# Patient Record
Sex: Female | Born: 1950 | Race: Black or African American | Hispanic: No | Smoking: Never smoker
Health system: Southern US, Community
[De-identification: ages and names within clinical notes are randomized; demographics above are authoritative.]

## PROBLEM LIST (undated history)

## (undated) DIAGNOSIS — J439 Emphysema, unspecified: Secondary | ICD-10-CM

## (undated) DIAGNOSIS — R609 Edema, unspecified: Secondary | ICD-10-CM

## (undated) DIAGNOSIS — K649 Unspecified hemorrhoids: Secondary | ICD-10-CM

## (undated) DIAGNOSIS — K921 Melena: Secondary | ICD-10-CM

## (undated) DIAGNOSIS — I509 Heart failure, unspecified: Secondary | ICD-10-CM

## (undated) DIAGNOSIS — I10 Essential (primary) hypertension: Secondary | ICD-10-CM

## (undated) DIAGNOSIS — F32A Depression, unspecified: Secondary | ICD-10-CM

## (undated) DIAGNOSIS — E05 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: Secondary | ICD-10-CM

## (undated) DIAGNOSIS — R51 Headache: Principal | ICD-10-CM

## (undated) DIAGNOSIS — I517 Cardiomegaly: Secondary | ICD-10-CM

## (undated) DIAGNOSIS — D649 Anemia, unspecified: Secondary | ICD-10-CM

## (undated) DIAGNOSIS — G4730 Sleep apnea, unspecified: Secondary | ICD-10-CM

## (undated) DIAGNOSIS — E119 Type 2 diabetes mellitus without complications: Secondary | ICD-10-CM

## (undated) DIAGNOSIS — G4733 Obstructive sleep apnea (adult) (pediatric): Secondary | ICD-10-CM

## (undated) DIAGNOSIS — K900 Celiac disease: Secondary | ICD-10-CM

## (undated) DIAGNOSIS — E079 Disorder of thyroid, unspecified: Secondary | ICD-10-CM

## (undated) DIAGNOSIS — E785 Hyperlipidemia, unspecified: Secondary | ICD-10-CM

## (undated) DIAGNOSIS — R569 Unspecified convulsions: Secondary | ICD-10-CM

## (undated) DIAGNOSIS — I639 Cerebral infarction, unspecified: Secondary | ICD-10-CM

## (undated) DIAGNOSIS — R011 Cardiac murmur, unspecified: Secondary | ICD-10-CM

## (undated) DIAGNOSIS — F329 Major depressive disorder, single episode, unspecified: Secondary | ICD-10-CM

## (undated) DIAGNOSIS — E669 Obesity, unspecified: Secondary | ICD-10-CM

## (undated) DIAGNOSIS — IMO0002 Reserved for concepts with insufficient information to code with codable children: Secondary | ICD-10-CM

## (undated) HISTORY — DX: Unspecified convulsions: R56.9

## (undated) HISTORY — DX: Reserved for concepts with insufficient information to code with codable children: IMO0002

## (undated) HISTORY — DX: Cardiac murmur, unspecified: R01.1

## (undated) HISTORY — DX: Type 2 diabetes mellitus without complications: E11.9

## (undated) HISTORY — DX: Headache: R51

## (undated) HISTORY — PX: IUD REMOVAL: SHX5392

## (undated) HISTORY — DX: Anemia, unspecified: D64.9

## (undated) HISTORY — DX: Cardiomegaly: I51.7

## (undated) HISTORY — DX: Cerebral infarction, unspecified: I63.9

## (undated) HISTORY — DX: Depression, unspecified: F32.A

## (undated) HISTORY — DX: Emphysema, unspecified: J43.9

## (undated) HISTORY — PX: HEMORRHOID SURGERY: SHX153

## (undated) HISTORY — DX: Unspecified hemorrhoids: K64.9

## (undated) HISTORY — DX: 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: E05.00

## (undated) HISTORY — DX: Obstructive sleep apnea (adult) (pediatric): G47.33

## (undated) HISTORY — DX: Essential (primary) hypertension: I10

## (undated) HISTORY — DX: Disorder of thyroid, unspecified: E07.9

## (undated) HISTORY — DX: Celiac disease: K90.0

## (undated) HISTORY — DX: Hyperlipidemia, unspecified: E78.5

## (undated) HISTORY — DX: Sleep apnea, unspecified: G47.30

## (undated) HISTORY — DX: Melena: K92.1

## (undated) HISTORY — PX: TONSILLECTOMY: SUR1361

## (undated) HISTORY — PX: VEIN SURGERY: SHX48

## (undated) HISTORY — DX: Heart failure, unspecified: I50.9

## (undated) HISTORY — DX: Obesity, unspecified: E66.9

## (undated) HISTORY — DX: Edema, unspecified: R60.9

## (undated) HISTORY — DX: Major depressive disorder, single episode, unspecified: F32.9

---

## 2000-10-06 ENCOUNTER — Encounter: Payer: Self-pay | Admitting: Internal Medicine

## 2000-10-06 ENCOUNTER — Inpatient Hospital Stay (HOSPITAL_COMMUNITY): Admission: EM | Admit: 2000-10-06 | Discharge: 2000-10-08 | Payer: Self-pay | Admitting: Emergency Medicine

## 2000-12-29 ENCOUNTER — Emergency Department (HOSPITAL_COMMUNITY): Admission: EM | Admit: 2000-12-29 | Discharge: 2000-12-29 | Payer: Self-pay | Admitting: Emergency Medicine

## 2001-02-28 ENCOUNTER — Encounter: Payer: Self-pay | Admitting: Emergency Medicine

## 2001-02-28 ENCOUNTER — Emergency Department (HOSPITAL_COMMUNITY): Admission: EM | Admit: 2001-02-28 | Discharge: 2001-02-28 | Payer: Self-pay | Admitting: Emergency Medicine

## 2001-08-31 ENCOUNTER — Emergency Department (HOSPITAL_COMMUNITY): Admission: EM | Admit: 2001-08-31 | Discharge: 2001-08-31 | Payer: Self-pay | Admitting: Emergency Medicine

## 2002-03-30 ENCOUNTER — Emergency Department (HOSPITAL_COMMUNITY): Admission: EM | Admit: 2002-03-30 | Discharge: 2002-03-30 | Payer: Self-pay | Admitting: Emergency Medicine

## 2002-05-06 ENCOUNTER — Encounter: Payer: Self-pay | Admitting: Internal Medicine

## 2002-05-06 ENCOUNTER — Encounter: Admission: RE | Admit: 2002-05-06 | Discharge: 2002-05-06 | Payer: Self-pay | Admitting: Internal Medicine

## 2002-05-25 ENCOUNTER — Ambulatory Visit (HOSPITAL_COMMUNITY): Admission: RE | Admit: 2002-05-25 | Discharge: 2002-05-25 | Payer: Self-pay | Admitting: Gastroenterology

## 2002-05-25 ENCOUNTER — Encounter: Payer: Self-pay | Admitting: Gastroenterology

## 2002-05-27 ENCOUNTER — Other Ambulatory Visit: Admission: RE | Admit: 2002-05-27 | Discharge: 2002-05-27 | Payer: Self-pay | Admitting: Obstetrics and Gynecology

## 2002-05-29 ENCOUNTER — Encounter (INDEPENDENT_AMBULATORY_CARE_PROVIDER_SITE_OTHER): Payer: Self-pay

## 2002-05-29 ENCOUNTER — Ambulatory Visit (HOSPITAL_COMMUNITY): Admission: RE | Admit: 2002-05-29 | Discharge: 2002-05-29 | Payer: Self-pay | Admitting: Gastroenterology

## 2002-08-10 ENCOUNTER — Emergency Department (HOSPITAL_COMMUNITY): Admission: EM | Admit: 2002-08-10 | Discharge: 2002-08-10 | Payer: Self-pay | Admitting: Emergency Medicine

## 2002-10-08 ENCOUNTER — Encounter: Admission: RE | Admit: 2002-10-08 | Discharge: 2002-10-08 | Payer: Self-pay | Admitting: Gastroenterology

## 2002-10-08 ENCOUNTER — Encounter: Payer: Self-pay | Admitting: Gastroenterology

## 2002-11-03 ENCOUNTER — Ambulatory Visit: Admission: RE | Admit: 2002-11-03 | Discharge: 2002-11-03 | Payer: Self-pay | Admitting: Cardiology

## 2002-11-03 ENCOUNTER — Encounter (INDEPENDENT_AMBULATORY_CARE_PROVIDER_SITE_OTHER): Payer: Self-pay | Admitting: Cardiology

## 2004-04-17 ENCOUNTER — Ambulatory Visit (HOSPITAL_COMMUNITY): Admission: RE | Admit: 2004-04-17 | Discharge: 2004-04-17 | Payer: Self-pay | Admitting: Gastroenterology

## 2005-01-03 ENCOUNTER — Emergency Department (HOSPITAL_COMMUNITY): Admission: EM | Admit: 2005-01-03 | Discharge: 2005-01-03 | Payer: Self-pay | Admitting: Emergency Medicine

## 2005-02-09 ENCOUNTER — Encounter: Admission: RE | Admit: 2005-02-09 | Discharge: 2005-02-09 | Payer: Self-pay | Admitting: Internal Medicine

## 2006-03-26 ENCOUNTER — Emergency Department (HOSPITAL_COMMUNITY): Admission: EM | Admit: 2006-03-26 | Discharge: 2006-03-26 | Payer: Self-pay | Admitting: Family Medicine

## 2006-05-25 ENCOUNTER — Emergency Department (HOSPITAL_COMMUNITY): Admission: EM | Admit: 2006-05-25 | Discharge: 2006-05-25 | Payer: Self-pay | Admitting: Family Medicine

## 2006-09-06 ENCOUNTER — Emergency Department (HOSPITAL_COMMUNITY): Admission: EM | Admit: 2006-09-06 | Discharge: 2006-09-06 | Payer: Self-pay | Admitting: Family Medicine

## 2006-09-07 ENCOUNTER — Inpatient Hospital Stay (HOSPITAL_COMMUNITY): Admission: AD | Admit: 2006-09-07 | Discharge: 2006-09-11 | Payer: Self-pay | Admitting: Internal Medicine

## 2006-09-07 ENCOUNTER — Encounter: Payer: Self-pay | Admitting: Emergency Medicine

## 2006-09-09 ENCOUNTER — Encounter (INDEPENDENT_AMBULATORY_CARE_PROVIDER_SITE_OTHER): Payer: Self-pay | Admitting: Cardiology

## 2006-09-09 ENCOUNTER — Encounter: Payer: Self-pay | Admitting: Vascular Surgery

## 2006-11-28 ENCOUNTER — Ambulatory Visit (HOSPITAL_COMMUNITY): Admission: RE | Admit: 2006-11-28 | Discharge: 2006-11-28 | Payer: Self-pay | Admitting: Neurosurgery

## 2006-12-11 ENCOUNTER — Encounter: Admission: RE | Admit: 2006-12-11 | Discharge: 2006-12-11 | Payer: Self-pay | Admitting: Internal Medicine

## 2006-12-15 ENCOUNTER — Emergency Department (HOSPITAL_COMMUNITY): Admission: EM | Admit: 2006-12-15 | Discharge: 2006-12-15 | Payer: Self-pay | Admitting: Family Medicine

## 2007-03-24 ENCOUNTER — Emergency Department (HOSPITAL_COMMUNITY): Admission: EM | Admit: 2007-03-24 | Discharge: 2007-03-24 | Payer: Self-pay | Admitting: Family Medicine

## 2008-01-12 ENCOUNTER — Encounter: Admission: RE | Admit: 2008-01-12 | Discharge: 2008-01-12 | Payer: Self-pay | Admitting: Internal Medicine

## 2008-03-13 ENCOUNTER — Emergency Department (HOSPITAL_COMMUNITY): Admission: EM | Admit: 2008-03-13 | Discharge: 2008-03-13 | Payer: Self-pay | Admitting: Emergency Medicine

## 2009-03-01 ENCOUNTER — Encounter: Admission: RE | Admit: 2009-03-01 | Discharge: 2009-03-01 | Payer: Self-pay | Admitting: Internal Medicine

## 2009-03-27 ENCOUNTER — Emergency Department (HOSPITAL_COMMUNITY): Admission: EM | Admit: 2009-03-27 | Discharge: 2009-03-27 | Payer: Self-pay | Admitting: Family Medicine

## 2009-12-14 ENCOUNTER — Emergency Department (HOSPITAL_COMMUNITY): Admission: EM | Admit: 2009-12-14 | Discharge: 2009-12-14 | Payer: Self-pay | Admitting: Family Medicine

## 2010-06-30 ENCOUNTER — Inpatient Hospital Stay (HOSPITAL_COMMUNITY): Admission: EM | Admit: 2010-06-30 | Discharge: 2010-07-02 | Payer: Self-pay | Admitting: Emergency Medicine

## 2010-06-30 DIAGNOSIS — R569 Unspecified convulsions: Secondary | ICD-10-CM | POA: Insufficient documentation

## 2010-07-27 ENCOUNTER — Encounter: Admission: RE | Admit: 2010-07-27 | Discharge: 2010-07-27 | Payer: Self-pay | Admitting: Family Medicine

## 2010-07-31 ENCOUNTER — Ambulatory Visit: Payer: Self-pay | Admitting: Cardiovascular Disease

## 2010-09-09 ENCOUNTER — Emergency Department (HOSPITAL_COMMUNITY): Admission: EM | Admit: 2010-09-09 | Discharge: 2010-09-09 | Payer: Self-pay | Admitting: Emergency Medicine

## 2010-10-15 ENCOUNTER — Emergency Department (HOSPITAL_COMMUNITY): Admission: EM | Admit: 2010-10-15 | Discharge: 2010-10-15 | Payer: Self-pay | Admitting: Emergency Medicine

## 2010-10-15 ENCOUNTER — Encounter (INDEPENDENT_AMBULATORY_CARE_PROVIDER_SITE_OTHER): Payer: Self-pay | Admitting: Emergency Medicine

## 2010-10-15 ENCOUNTER — Ambulatory Visit: Payer: Self-pay | Admitting: Vascular Surgery

## 2010-10-27 ENCOUNTER — Emergency Department (HOSPITAL_COMMUNITY): Admission: EM | Admit: 2010-10-27 | Discharge: 2010-10-27 | Payer: Self-pay | Admitting: Emergency Medicine

## 2010-10-27 ENCOUNTER — Emergency Department (HOSPITAL_COMMUNITY): Admission: EM | Admit: 2010-10-27 | Discharge: 2010-10-28 | Payer: Self-pay | Admitting: Emergency Medicine

## 2010-10-31 ENCOUNTER — Emergency Department (HOSPITAL_COMMUNITY): Admission: EM | Admit: 2010-10-31 | Discharge: 2010-10-31 | Payer: Self-pay | Admitting: Emergency Medicine

## 2010-12-08 ENCOUNTER — Ambulatory Visit: Payer: Self-pay | Admitting: Vascular Surgery

## 2011-01-03 ENCOUNTER — Ambulatory Visit: Admit: 2011-01-03 | Payer: Self-pay | Admitting: Obstetrics & Gynecology

## 2011-01-05 ENCOUNTER — Ambulatory Visit: Admit: 2011-01-05 | Payer: Self-pay | Admitting: Vascular Surgery

## 2011-01-05 ENCOUNTER — Ambulatory Visit
Admission: RE | Admit: 2011-01-05 | Discharge: 2011-01-05 | Payer: Self-pay | Source: Home / Self Care | Attending: Vascular Surgery | Admitting: Vascular Surgery

## 2011-01-05 NOTE — Assessment & Plan Note (Addendum)
OFFICE VISIT  Kennerly, Trenisha DOB:  Apr 23, 1951                                       01/05/2011 [id number]#:[id number]  This is an established patient.  HISTORY OF PRESENT ILLNESS:  This is a 60 year old patient who presents for followup after bilateral lower extremity venous insufficiency duplexes.  She had a venous ulceration in primarily her right leg on her evaluation in December that I recommended she proceed forward with compression stockings.  She notes that she has been religiously using her compression stockings and she feels that the discoloration in both legs is improved and the ulcers are starting to close up.  No fevers or chills at this point.  PHYSICAL EXAMINATION:  Vital signs:  Today she had a blood pressure 125/82, a heart rate of 65, respirations were 12, saturating 97%.  On the right shin the medial area she has a couple of healing ulcers that are less than a centimeter at this point.  Does have skin changes consistent with a lipodermatosclerosis in both legs.  No obvious ulcerations in the left leg.  On noninvasive vascular imaging she had bilateral lower extremity insufficiency duplex completed.  It demonstrates the right side an enlarged greater saphenous vein.  There is evidence of reflux within both saphenofemoral junctions, reflux also in the right proximal greater saphenous vein and reflux within bilateral common femoral veins, looks like there is no obvious DVT elsewhere.  MEDICAL DECISION MAKING:  This is a 60 year old female with slowly healing right venous stasis ulcers with now confirmed venous insufficiency.  I will refer her now to the vein side of the clinic for evaluation by Dr. Hart Rochester or Dr. Arbie Cookey for possible endovenous ablation. She has been on greater than 6 months worth of compression stockings by report.  Will need to obtain the records from her outside physician who started the compression regimen.  I told her to  continue use of the compression stockings, she is going to continue local wound care to her right leg and then hopefully she will be a candidate for possible ablation of her saphenous vein on the right side first.    Fransisco Hertz, MD Electronically Signed  BLC/MEDQ  D:  01/05/2011  T:  01/05/2011  Job:  [id number]

## 2011-01-07 ENCOUNTER — Encounter: Payer: Self-pay | Admitting: Internal Medicine

## 2011-01-09 NOTE — Procedures (Unsigned)
LOWER EXTREMITY VENOUS REFLUX EXAM  INDICATION:  Ulcers and swelling.  EXAM:  Using color-flow imaging and pulse Doppler spectral analysis, the right and left common femoral, superficial femoral, popliteal, posterior tibial, greater and lesser saphenous veins are evaluated.  There is evidence suggesting deep venous insufficiency in the right and left common femoral vein.  The right and left saphenofemoral junction is not competent with Reflux of >563milliseconds. The right GSV is not competent with Reflux of >57milliseconds in the saphenofemoral junction in the proximal thigh. The left GSV is competent.  The right and left proximal short saphenous vein demonstrates competency.  GSV Diameter (used if found to be incompetent only)                                           Right    Left Proximal Greater Saphenous Vein           0.70 cm  0.85 cm Proximal-to-mid-thigh                     0.71 cm  cm Mid thigh                                 cm       cm Mid-distal thigh                          cm       cm Distal thigh                              cm       cm Knee                                      cm       cm  IMPRESSION: 1. Right and left greater saphenous veins are not competent with     reflux >535milliseconds in the saphenofemoral junction on the right     as well as the right proximal thigh.  The left saphenofemoral     junction is not competent. 2. The right and left greater saphenous veins are not tortuous. 3. The deep venous system is not competent with reflux of >500     milliseconds within bilateral common femoral veins. 4. The right and left short saphenous vein is competent.         ___________________________________________ Fransisco Hertz, MD  OD/MEDQ  D:  01/05/2011  T:  01/05/2011  Job:  [id number]

## 2011-01-31 ENCOUNTER — Ambulatory Visit (INDEPENDENT_AMBULATORY_CARE_PROVIDER_SITE_OTHER): Payer: Medicaid Other | Admitting: Cardiovascular Disease

## 2011-01-31 DIAGNOSIS — I119 Hypertensive heart disease without heart failure: Secondary | ICD-10-CM

## 2011-02-14 ENCOUNTER — Encounter (INDEPENDENT_AMBULATORY_CARE_PROVIDER_SITE_OTHER): Payer: Medicaid Other | Admitting: Vascular Surgery

## 2011-02-14 DIAGNOSIS — I83893 Varicose veins of bilateral lower extremities with other complications: Secondary | ICD-10-CM

## 2011-02-15 NOTE — Assessment & Plan Note (Signed)
OFFICE VISIT  [name], [name] DOB:  Jan 05, 1951                                       02/14/2011 [id number]#:[id number]  The patient presents today for continued followup of her venous stasis ulceration and venous hypertension.  She had seen Dr. Leonides Sake in his office in consultation on December 31, 2010.  I am seeing her for further discussion.  She does have reflux in her great saphenous veins bilaterally.  She does have near-total healing of the venous ulcer on her pretibial area on the right and has had progressive skin changes on the left but no complete ulceration.  Physical exam is otherwise unchanged.  She does have palpable pedal pulses.  I discussed the options with the patient.  She is continuing to have pain despite the compression.  I have recommended that we proceed with laser ablation of her saphenous veins for reduction of venous hypertension.  Hopefully, this will halt her changes in venous hypertension and ulceration.  She understands this is an office procedure under local anesthesia.  We will plan on treating her right leg first since this is the most severe, followed by staged left leg ablation at her convenience.    Larina Earthly, M.D. Electronically Signed  TFE/MEDQ  D:  02/14/2011  T:  02/15/2011  Job:  [id number]  cc:   Ace Gins, MD

## 2011-02-23 ENCOUNTER — Encounter (HOSPITAL_COMMUNITY)
Admission: RE | Admit: 2011-02-23 | Discharge: 2011-02-23 | Disposition: A | Discharge status: Home / Self Care | Payer: Medicaid Other | Source: Ambulatory Visit | Attending: Obstetrics and Gynecology | Admitting: Obstetrics and Gynecology

## 2011-02-23 DIAGNOSIS — Z01812 Encounter for preprocedural laboratory examination: Secondary | ICD-10-CM | POA: Insufficient documentation

## 2011-02-23 DIAGNOSIS — Z01818 Encounter for other preprocedural examination: Secondary | ICD-10-CM | POA: Insufficient documentation

## 2011-02-23 LAB — CBC
HCT: 36.6 % (ref 36.0–46.0)
Hemoglobin: 11.6 g/dL — ABNORMAL LOW (ref 12.0–15.0)
MCH: 29.1 pg (ref 26.0–34.0)
MCHC: 31.7 g/dL (ref 30.0–36.0)
MCV: 91.7 fL (ref 78.0–100.0)
Platelets: 169 10*3/uL (ref 150–400)
RBC: 3.99 MIL/uL (ref 3.87–5.11)
RDW: 15.3 % (ref 11.5–15.5)
WBC: 3.9 10*3/uL — ABNORMAL LOW (ref 4.0–10.5)

## 2011-02-23 LAB — COMPREHENSIVE METABOLIC PANEL
ALT: 24 U/L (ref 0–35)
AST: 28 U/L (ref 0–37)
Albumin: 3.7 g/dL (ref 3.5–5.2)
Alkaline Phosphatase: 60 U/L (ref 39–117)
BUN: 13 mg/dL (ref 6–23)
CO2: 29 mEq/L (ref 19–32)
Calcium: 9.2 mg/dL (ref 8.4–10.5)
Chloride: 106 mEq/L (ref 96–112)
Creatinine, Ser: 1.16 mg/dL (ref 0.4–1.2)
GFR calc Af Amer: 58 mL/min — ABNORMAL LOW (ref >60)
GFR calc non Af Amer: 48 mL/min — ABNORMAL LOW (ref >60)
Glucose, Bld: 122 mg/dL — ABNORMAL HIGH (ref 70–99)
Potassium: 3.7 mEq/L (ref 3.5–5.1)
Sodium: 139 mEq/L (ref 135–145)
Total Bilirubin: 0.8 mg/dL (ref 0.3–1.2)
Total Protein: 7.1 g/dL (ref 6.0–8.3)

## 2011-02-27 ENCOUNTER — Ambulatory Visit (HOSPITAL_COMMUNITY)
Admission: RE | Admit: 2011-02-27 | Discharge: 2011-02-27 | Disposition: A | Discharge status: Home / Self Care | Payer: Medicaid Other | Source: Ambulatory Visit | Attending: Obstetrics and Gynecology | Admitting: Obstetrics and Gynecology

## 2011-02-27 ENCOUNTER — Other Ambulatory Visit: Payer: Self-pay | Admitting: Obstetrics and Gynecology

## 2011-02-27 DIAGNOSIS — N950 Postmenopausal bleeding: Secondary | ICD-10-CM | POA: Insufficient documentation

## 2011-02-27 DIAGNOSIS — Z01812 Encounter for preprocedural laboratory examination: Secondary | ICD-10-CM | POA: Insufficient documentation

## 2011-02-27 DIAGNOSIS — Z30432 Encounter for removal of intrauterine contraceptive device: Secondary | ICD-10-CM | POA: Insufficient documentation

## 2011-02-27 LAB — TYPE AND SCREEN
ABO/RH(D): B POS
Antibody Screen: NEGATIVE

## 2011-02-27 LAB — BASIC METABOLIC PANEL
BUN: 16 mg/dL (ref 6–23)
CO2: 24 mEq/L (ref 19–32)
Calcium: 8.8 mg/dL (ref 8.4–10.5)
Chloride: 104 mEq/L (ref 96–112)
Creatinine, Ser: 0.98 mg/dL (ref 0.4–1.2)
GFR calc Af Amer: >60 mL/min (ref >60)
GFR calc non Af Amer: 58 mL/min — ABNORMAL LOW (ref >60)
Glucose, Bld: 92 mg/dL (ref 70–99)
Potassium: 3.9 mEq/L (ref 3.5–5.1)
Sodium: 135 mEq/L (ref 135–145)

## 2011-02-27 LAB — DIFFERENTIAL
Basophils Absolute: 0 10*3/uL (ref 0.0–0.1)
Basophils Absolute: 0 10*3/uL (ref 0.0–0.1)
Basophils Relative: 0 % (ref 0–1)
Basophils Relative: 1 % (ref 0–1)
Eosinophils Absolute: 0.1 10*3/uL (ref 0.0–0.7)
Eosinophils Absolute: 0.2 10*3/uL (ref 0.0–0.7)
Eosinophils Relative: 3 % (ref 0–5)
Eosinophils Relative: 3 % (ref 0–5)
Lymphocytes Relative: 34 % (ref 12–46)
Lymphocytes Relative: 38 % (ref 12–46)
Lymphs Abs: 2 10*3/uL (ref 0.7–4.0)
Lymphs Abs: 2 10*3/uL (ref 0.7–4.0)
Monocytes Absolute: 0.4 10*3/uL (ref 0.1–1.0)
Monocytes Absolute: 0.6 10*3/uL (ref 0.1–1.0)
Monocytes Relative: 11 % (ref 3–12)
Monocytes Relative: 8 % (ref 3–12)
Neutro Abs: 2.6 10*3/uL (ref 1.7–7.7)
Neutro Abs: 3 10*3/uL (ref 1.7–7.7)
Neutrophils Relative %: 51 % (ref 43–77)
Neutrophils Relative %: 52 % (ref 43–77)

## 2011-02-27 LAB — CBC
HCT: 31.1 % — ABNORMAL LOW (ref 36.0–46.0)
HCT: 34.1 % — ABNORMAL LOW (ref 36.0–46.0)
Hemoglobin: 10.1 g/dL — ABNORMAL LOW (ref 12.0–15.0)
Hemoglobin: 10.8 g/dL — ABNORMAL LOW (ref 12.0–15.0)
MCH: 28.6 pg (ref 26.0–34.0)
MCH: 28.9 pg (ref 26.0–34.0)
MCHC: 31.7 g/dL (ref 30.0–36.0)
MCHC: 32.5 g/dL (ref 30.0–36.0)
MCV: 89.1 fL (ref 78.0–100.0)
MCV: 90.5 fL (ref 78.0–100.0)
Platelets: 304 10*3/uL (ref 150–400)
Platelets: 340 10*3/uL (ref 150–400)
RBC: 3.49 MIL/uL — ABNORMAL LOW (ref 3.87–5.11)
RBC: 3.77 MIL/uL — ABNORMAL LOW (ref 3.87–5.11)
RDW: 14.1 % (ref 11.5–15.5)
RDW: 14.2 % (ref 11.5–15.5)
WBC: 5.2 10*3/uL (ref 4.0–10.5)
WBC: 5.8 10*3/uL (ref 4.0–10.5)

## 2011-02-27 LAB — WOUND CULTURE

## 2011-02-27 LAB — ABO/RH: ABO/RH(D): B POS

## 2011-02-28 LAB — GLUCOSE, CAPILLARY: Glucose-Capillary: 121 mg/dL — ABNORMAL HIGH (ref 70–99)

## 2011-03-03 LAB — CBC
HCT: 37.8 % (ref 36.0–46.0)
HCT: 40 % (ref 36.0–46.0)
Hemoglobin: 12.6 g/dL (ref 12.0–15.0)
Hemoglobin: 13.2 g/dL (ref 12.0–15.0)
MCH: 30.4 pg (ref 26.0–34.0)
MCH: 30.7 pg (ref 26.0–34.0)
MCHC: 33 g/dL (ref 30.0–36.0)
MCHC: 33.3 g/dL (ref 30.0–36.0)
MCV: 92.1 fL (ref 78.0–100.0)
MCV: 92.1 fL (ref 78.0–100.0)
Platelets: 183 10*3/uL (ref 150–400)
Platelets: 251 10*3/uL (ref 150–400)
RBC: 4.1 MIL/uL (ref 3.87–5.11)
RBC: 4.35 MIL/uL (ref 3.87–5.11)
RDW: 15 % (ref 11.5–15.5)
RDW: 15 % (ref 11.5–15.5)
WBC: 6.5 10*3/uL (ref 4.0–10.5)
WBC: 6.5 10*3/uL (ref 4.0–10.5)

## 2011-03-03 LAB — BASIC METABOLIC PANEL
BUN: 10 mg/dL (ref 6–23)
CO2: 26 mEq/L (ref 19–32)
Calcium: 9.3 mg/dL (ref 8.4–10.5)
Chloride: 105 mEq/L (ref 96–112)
Creatinine, Ser: 1.22 mg/dL — ABNORMAL HIGH (ref 0.4–1.2)
GFR calc Af Amer: 55 mL/min — ABNORMAL LOW (ref >60)
GFR calc non Af Amer: 45 mL/min — ABNORMAL LOW (ref >60)
Glucose, Bld: 127 mg/dL — ABNORMAL HIGH (ref 70–99)
Potassium: 4 mEq/L (ref 3.5–5.1)
Sodium: 139 mEq/L (ref 135–145)

## 2011-03-03 LAB — COMPREHENSIVE METABOLIC PANEL
ALT: 22 U/L (ref 0–35)
AST: 37 U/L (ref 0–37)
Albumin: 4.3 g/dL (ref 3.5–5.2)
Alkaline Phosphatase: 81 U/L (ref 39–117)
BUN: 10 mg/dL (ref 6–23)
CO2: 26 mEq/L (ref 19–32)
Calcium: 9.4 mg/dL (ref 8.4–10.5)
Chloride: 104 mEq/L (ref 96–112)
Creatinine, Ser: 1.28 mg/dL — ABNORMAL HIGH (ref 0.4–1.2)
GFR calc Af Amer: 52 mL/min — ABNORMAL LOW (ref >60)
GFR calc non Af Amer: 43 mL/min — ABNORMAL LOW (ref >60)
Glucose, Bld: 158 mg/dL — ABNORMAL HIGH (ref 70–99)
Potassium: 4 mEq/L (ref 3.5–5.1)
Sodium: 138 mEq/L (ref 135–145)
Total Bilirubin: 0.5 mg/dL (ref 0.3–1.2)
Total Protein: 8.1 g/dL (ref 6.0–8.3)

## 2011-03-03 LAB — MRSA PCR SCREENING: MRSA by PCR: NEGATIVE

## 2011-03-03 LAB — DIFFERENTIAL
Basophils Absolute: 0 10*3/uL (ref 0.0–0.1)
Basophils Relative: 1 % (ref 0–1)
Eosinophils Absolute: 0.2 10*3/uL (ref 0.0–0.7)
Eosinophils Relative: 3 % (ref 0–5)
Lymphocytes Relative: 22 % (ref 12–46)
Lymphs Abs: 1.4 10*3/uL (ref 0.7–4.0)
Monocytes Absolute: 0.4 10*3/uL (ref 0.1–1.0)
Monocytes Relative: 6 % (ref 3–12)
Neutro Abs: 4.5 10*3/uL (ref 1.7–7.7)
Neutrophils Relative %: 69 % (ref 43–77)

## 2011-03-03 LAB — GLUCOSE, CAPILLARY
Glucose-Capillary: 173 mg/dL — ABNORMAL HIGH (ref 70–99)
Glucose-Capillary: 347 mg/dL — ABNORMAL HIGH (ref 70–99)

## 2011-03-03 LAB — TROPONIN I: Troponin I: 0.02 ng/mL (ref 0.00–0.06)

## 2011-03-03 LAB — CK TOTAL AND CKMB (NOT AT ARMC)
CK, MB: 4.7 ng/mL — ABNORMAL HIGH (ref 0.3–4.0)
Relative Index: 1 (ref 0.0–2.5)
Total CK: 465 U/L — ABNORMAL HIGH (ref 7–177)

## 2011-03-03 LAB — APTT: aPTT: 30 seconds (ref 24–37)

## 2011-03-03 LAB — PROTIME-INR
INR: 1 (ref 0.00–1.49)
Prothrombin Time: 13.1 seconds (ref 11.6–15.2)

## 2011-03-03 NOTE — H&P (Signed)
[name], [name]                 ACCOUNT NO.:  [id number]  MEDICAL RECORD NO.:  [id number]           PATIENT TYPE:  O  LOCATION:  [facility]                           FACILITY:  [facility]  PHYSICIAN:  Huel Cote, M.D. DATE OF BIRTH:  [birth year]  DATE OF ADMISSION:  02/23/2011 DATE OF DISCHARGE:  02/23/2011                             HISTORY & PHYSICAL   DATE OF SURGERY:  February 27, 2011  TIME OF SURGERY:  7:30 a.m.  The patient is a 60 year old G5, P3 who is coming in for a scheduled D and C hysteroscopy and retrieval of retained IUD.  The patient first noted some postmenopausal bleeding back in November 2011, which she reported to her primary care doctor.  He advised her to see a GYN doctor, which she has done.  At that point, she had some light vaginal bleeding for several days and reports that since that time, there has really been no significant active bleeding.  She is not currently sexually active and had an ultrasound, which confirmed what she reported as a retained IUD with the strings not visible in the vagina and an endometrium that seems normal at 3-4 mm.  She also had some issues with some rectal bleeding, which she will be working up with GI.  PAST MEDICAL HISTORY:  Significant for; 1. Borderline diabetes. 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 3. She had a hemorrhagic stroke in 2005, which resolved with no     residual side effects. 4. Hypercholesterolemia. 5. Hypertension. 6. Acquired hypothyroidism. 7. History of a peptic ulcer and did have a seizure in 2011, which was     from her scarring from her CVA.  PAST SURGICAL HISTORY:  Significant for cesarean section and wisdom tooth extraction.  PAST OBSTETRICAL HISTORY:  Significant for 4 vaginal deliveries and one cesarean section.  PAST GYN HISTORY:  The retained IUD as stated and the postmenopausal bleeding, she had no history of any abnormal Pap smears.  She socially is widowed.  She denies any tobacco, alcohol or drug  use.  Two of her children have predeceased her from gunshot wounds.  ALLERGIES:  . She has no known allergies to latex or medication.  MEDICATIONS:  Her medications are as follows; 1. Norvasc. 2. Carvedilol. 3. Carbon. 4. Levothyroxine. 5. Vitamin D. 6. Klor-Con  PHYSICAL EXAMINATION:  VITAL SIGNS:  Her weight is 281 pounds.  Height is 5 feet 4 itches. CARDIAC:  Regular rate and rhythm. LUNGS:  Clear. ABDOMEN:  Soft and nontender.  She has a midline scar from her prior C- section.  ASSESSMENT AND PLAN:  She has normal external genitalia noted except for atrophic change.  Cervix is normal with no IUD part or strings visible. Uterus is nontender to palpation and small and normal in size at approximately 5 to 6 cm.  The patient was counseled as to her options and wishes to have her retained IUD removed.  At the same time, we will do endometrial sampling of her cavity given her postmenopausal spotting, however, given the normal endometrial lining, this would appear to be likely related to the IUD itself and  no significant pathology.  The risks of uterine perforation and bleeding were discussed with the patient in detail.  She will do a prior treatment with Cytotec to see if she can help with reducing the risk of uterine perforation with cervical dilatation and we will proceed with Surgery as stated at [facility] facility on February 27, 2011.     Huel Cote, M.D.     KR/MEDQ  D:  02/26/2011  T:  02/26/2011  Job:  [id number]  Electronically Signed by Huel Cote M.D. on 03/03/2011 10:43:13 AM

## 2011-03-03 NOTE — Op Note (Signed)
[name], [name]                 ACCOUNT NO.:  [id number]  MEDICAL RECORD NO.:  [id number]           PATIENT TYPE:  O  LOCATION:  [facility]                          FACILITY:  [facility]  PHYSICIAN:  Huel Cote, M.D. DATE OF BIRTH:  [birth year]  DATE OF PROCEDURE:  02/27/2011 DATE OF DISCHARGE:                              OPERATIVE REPORT   PREOPERATIVE DIAGNOSES: 1. Retained intrauterine device. 2. Postmenopausal bleeding.  POSTOPERATIVE DIAGNOSES: 1. Retained intrauterine device. 2. Postmenopausal bleeding.  PROCEDURE:  Hysteroscopic retrieval of retained IUD and a D and C.  SURGEON:  Huel Cote, MD  ANESTHESIA:  LMA with a local paracervical 1% lidocaine block.  FINDINGS:  There was an atrophic cavity.  The patient had an S-shaped IUD, which was wedged sideways and extending into the right cornua.  SPECIMENS:  Endometrial curettings were sent.  The IUD was removed and discarded.  Estimated blood loss 50 mL.  Urine output 150 mL of clear urine, 1800 mL LR.  Hysteroscopic deficit was less than 100 mL.  There were no known complications.  The patient was taken to the recovery room in good condition.  PROCEDURE NOTE:  The patient was taken to the operating room where anesthesia was obtained without difficulty.  She was then prepped and draped in the normal sterile fashion in the dorsal lithotomy position. Speculum was placed within the vagina.  The cervix identified and injected with approximately 20 mL of 1% lidocaine with at 2 and 10 o'clock and on the anterior lip.  The cervix was then noted be slightly stenotic and was dilated with the yellow flexible dilator.  Once this was performed, the cervix was able to be dilated with the [facility] dilators.  The diagnostic scope was then introduced into the uterine cavity without substantial difficulty and the IUD noted to be sideways with the trailing in to the patient's left and the right not visible as it extended up into the  uterine cornua.  The hysteroscopic grasper was then introduced through the port and with gentle traction, the IUD was manipulated where the trailing in and was brought down closer to the cervical os.  The portion extending into the right cornua could never be completely pulled free.  However, once the IUD was worked down closer to the cervix, a polyp forceps grasper was introduced through the cervix. After the hysteroscope was removed and the IUD was grasped and pulled out in its entirety, the hysteroscope was then reintroduced in the uterine cavity carefully explored and appeared that the cavity had no perforations and was intact.  A gentle curettage was then performed secondary to the patient's postmenopausal bleeding to make sure there was no pathology other than IUD and this was handed off to pathology. However, the cavity appeared quite atrophic and normal for her menopausal status.  The instruments and sponges were then removed from the vagina.  The tenaculum site on the left was treated with silver nitrate.  The tenaculum site on the right continued to have some oozing. Therefore, one figure-of-eight suture of 3-0 Vicryl was placed and good hemostasis obtained.  At the conclusion of  the procedure, all sponge, lap and instrument counts were correct x2 and the patient was awakened and taken to the recovery room in good condition.     Huel Cote, M.D.     KR/MEDQ  D:  02/27/2011  T:  02/27/2011  Job:  [id number]  Electronically Signed by Huel Cote M.D. on 03/03/2011 10:43:15 AM

## 2011-03-14 ENCOUNTER — Other Ambulatory Visit (INDEPENDENT_AMBULATORY_CARE_PROVIDER_SITE_OTHER): Payer: Medicaid Other | Admitting: Vascular Surgery

## 2011-03-14 DIAGNOSIS — I83893 Varicose veins of bilateral lower extremities with other complications: Secondary | ICD-10-CM

## 2011-03-15 NOTE — Assessment & Plan Note (Signed)
OFFICE VISIT  State, [name] DOB:  [birth year]                                       03/14/2011 [id number]#:[id number]  The patient had laser ablation of her right great saphenous vein from distal thigh to just below the saphenofemoral junction.  This was under tumescent anesthesia with no immediate complication.  She was discharged to home and will be seen again in 1 week with repeat duplex follow-up.    Larina Earthly, M.D. Electronically Signed  TFE/MEDQ  D:  03/14/2011  T:  03/15/2011  Job:  [id number]

## 2011-03-21 ENCOUNTER — Encounter (INDEPENDENT_AMBULATORY_CARE_PROVIDER_SITE_OTHER): Payer: Medicaid Other

## 2011-03-21 ENCOUNTER — Ambulatory Visit (INDEPENDENT_AMBULATORY_CARE_PROVIDER_SITE_OTHER): Payer: Medicaid Other | Admitting: Vascular Surgery

## 2011-03-21 DIAGNOSIS — I872 Venous insufficiency (chronic) (peripheral): Secondary | ICD-10-CM

## 2011-03-21 DIAGNOSIS — L97909 Non-pressure chronic ulcer of unspecified part of unspecified lower leg with unspecified severity: Secondary | ICD-10-CM

## 2011-03-21 DIAGNOSIS — Z48812 Encounter for surgical aftercare following surgery on the circulatory system: Secondary | ICD-10-CM

## 2011-03-21 DIAGNOSIS — I83893 Varicose veins of bilateral lower extremities with other complications: Secondary | ICD-10-CM

## 2011-03-21 DIAGNOSIS — I83009 Varicose veins of unspecified lower extremity with ulcer of unspecified site: Secondary | ICD-10-CM

## 2011-03-22 NOTE — Assessment & Plan Note (Signed)
OFFICE VISIT  Economos, Itzell DOB:  January 25, 1951                                       03/21/2011 [id number]#:[id number]  Patient presents today for follow-up of her right great saphenous vein laser ablation.  She has had no immediate complications.  She has minimal discomfort associated with this.  On physical exam she has a palpable dorsalis pedis pulse.  She does have some mild thickening over the medial aspect of her right thigh.  She underwent a formal duplex in our office today, and this reveals no evidence of DVT and does show ablation of her great saphenous vein from the distal insertion site up to just below the saphenofemoral junction.  I am pleased with her early result for treatment of her venous hypertension.  She wishes to proceed with staged left leg ablation, and we have scheduled this at her convenience in approximately 2-3 weeks.    Larina Earthly, M.D. Electronically Signed  TFE/MEDQ  D:  03/21/2011  T:  03/22/2011  Job:  [id number]

## 2011-03-27 NOTE — Procedures (Unsigned)
DUPLEX DEEP VENOUS EXAM - LOWER EXTREMITY  INDICATION:  Followup EVLA on right lower extremity on 03/14/2011.  HISTORY:  Edema:  Yes. Trauma/Surgery:  Right EVLA 03/14/2011. Pain:  Yes. PE:  No. Previous DVT:  No. Anticoagulants:  No. Other:  DUPLEX EXAM:               CFV   SFV   PopV  PTV    GSV               R  L  R  L  R  L  R   L  R  L Thrombosis    0  0  0     0     0      +  0 Spontaneous   +  +  +     +     +      0 Phasic        +  +  +     +     +      0 Augmentation  +  +  +     +     +      0 Compressible  +  +  +     +     +      0  + Competent     +  +  +     +  Legend:  + - yes  o - no  p - partial  D - decreased  IMPRESSION: 1. No evidence of deep venous thrombosis identified on the right lower     extremity. 2. Good post ablation result the length of the right greater saphenous     vein ablated without extension present at the common femoral     junction. 3. Patent contralateral common femoral vein and greater saphenous vein     at the saphenofemoral junction. 4. Incompetent perforator measuring 0.19 cm at the distal right calf     arising off the greater saphenous vein.     _____________________________ Larina Earthly, M.D.  SH/MEDQ  D:  03/21/2011  T:  03/21/2011  Job:  [id number]

## 2011-04-10 ENCOUNTER — Inpatient Hospital Stay (INDEPENDENT_AMBULATORY_CARE_PROVIDER_SITE_OTHER)
Admission: RE | Admit: 2011-04-10 | Discharge: 2011-04-10 | Disposition: A | Discharge status: Home / Self Care | Payer: Medicaid Other | Source: Ambulatory Visit | Attending: Family Medicine | Admitting: Family Medicine

## 2011-04-10 DIAGNOSIS — Z760 Encounter for issue of repeat prescription: Secondary | ICD-10-CM

## 2011-04-11 ENCOUNTER — Other Ambulatory Visit (INDEPENDENT_AMBULATORY_CARE_PROVIDER_SITE_OTHER): Payer: Medicaid Other | Admitting: Vascular Surgery

## 2011-04-11 DIAGNOSIS — I83893 Varicose veins of bilateral lower extremities with other complications: Secondary | ICD-10-CM

## 2011-04-11 LAB — TSH: TSH: 2.64 u[IU]/mL (ref 0.350–4.500)

## 2011-04-12 NOTE — Assessment & Plan Note (Signed)
OFFICE VISIT  Ventress, Chrislyn DOB:  1951-02-10                                       04/11/2011 [id number]#:[id number]  Patient presents today for treatment of her left leg venous hypertension.  She underwent uneventful laser ablation of her great saphenous vein from just below the knee to just below the saphenofemoral junction under local tumescent anesthesia.  She had no immediate complications, was discharged to home.  Will be seen again in 1 week for repeat duplex.    Larina Earthly, M.D. Electronically Signed  TFE/MEDQ  D:  04/11/2011  T:  04/12/2011  Job:  [id number]

## 2011-04-18 ENCOUNTER — Encounter (INDEPENDENT_AMBULATORY_CARE_PROVIDER_SITE_OTHER): Payer: Medicaid Other

## 2011-04-18 ENCOUNTER — Ambulatory Visit (INDEPENDENT_AMBULATORY_CARE_PROVIDER_SITE_OTHER): Payer: Medicaid Other | Admitting: Vascular Surgery

## 2011-04-18 DIAGNOSIS — I83893 Varicose veins of bilateral lower extremities with other complications: Secondary | ICD-10-CM

## 2011-04-18 DIAGNOSIS — I872 Venous insufficiency (chronic) (peripheral): Secondary | ICD-10-CM

## 2011-04-18 DIAGNOSIS — Z48812 Encounter for surgical aftercare following surgery on the circulatory system: Secondary | ICD-10-CM

## 2011-04-19 NOTE — Assessment & Plan Note (Signed)
OFFICE VISIT  Granda, Zeynep DOB:  1951-09-21                                       04/18/2011 UEAVW#:[id number]  Patient presents today for a 1-week follow-up of laser ablation of left great saphenous vein from just below her knee up to just below the saphenofemoral junction.  She has had minimal bruising and no significant discomfort related to this.  Blood pressure today is 141/86, pulse 65, respirations 16.  Her distal insertion site has healed quite nicely.  She does not have any bruising apparent.  She underwent repeat duplex of her left leg, and this shows no evidence of DVT.  Her great saphenous vein is thrombosed at the level of the saphenofemoral junction to the distal insertion site just below her knee.  I am quite pleased with her result, as is patient.  She will see [place] again on an as-needed basis for vascular concerns.    Larina Earthly, M.D. Electronically Signed  TFE/MEDQ  D:  04/18/2011  T:  04/19/2011  Job:  [id number]

## 2011-04-26 NOTE — Procedures (Unsigned)
DUPLEX DEEP VENOUS EXAM - LOWER EXTREMITY  INDICATION:  Left great saphenous vein ablation followup.  HISTORY:  Edema:  Yes. Trauma/Surgery:  Left great saphenous vein ablation. Pain:  Yes. PE:  No. Previous DVT:  No. Anticoagulants:  No. Other:  DUPLEX EXAM:               CFV   SFV   PopV  PTV    GSV               R  L  R  L  R  L  R   L  R  L Thrombosis    o  o     o     o      o     + Spontaneous   +  +     +     +      +     o Phasic        +  +     +     +      +     o Augmentation  +  +     +     +      +     o Compressible  +  +     +     +      +     o Competent     +  +     +     +      +     +  Legend:  + - yes  o - no  p - partial  D - decreased  IMPRESSION: 1. No evidence of left lower extremity deep venous thrombosis. 2. The left great saphenous vein is thrombosed from the saphenofemoral     junction to the distal insertion site.   _____________________________ Larina Earthly, M.D.  EM/MEDQ  D:  04/18/2011  T:  04/18/2011  Job:  [id number]

## 2011-05-01 NOTE — Assessment & Plan Note (Signed)
OFFICE VISIT   [name], [name]  DOB:  01/21/1951                                       12/08/2010  BJYNW#:[id number]   This is a new patient consultation from Dr. Ace Gins.   HISTORY OF PRESENT ILLNESS:  This is a 60 year old female who presents  with chief complaint of bilateral swollen legs, onset about 2 years ago.  She was previously told that she has some degree of venous insufficiency  and has been placed on some compression stockings by her dermatologist.  Apparently the swelling gets worse as the day continues, and she gets a  sense of fullness and pain, with some aching right greater than left.  She has developed a few episodes of erythema one of which was diagnosed  as cellulitis and she was given antibiotics.  She also developed some  skin breakdown in the right shin over the last month at which point she  was told to stop using compression and was referred to a vascular  surgeon.  She never had a previous history of a deep vein thrombosis.  There is no history of any type of venous disorders in this patient's  family.   PAST MEDICAL HISTORY:  1. Hypertension.  2. Hyperlipidemia.  3. Hyperthyroidism.  4. History of possible congestive heart failure.  The patient is not      clear exactly if this is a true diagnosis.  5. Obstructive sleep apnea.  6. Pre diabetes.  7. Pulmonary disease: pt unclear exact characterization   PAST SURGICAL HISTORY:  None.   SOCIAL HISTORY:  She is widowed with five children, currently not  employed.  She does not smoke and never smoked.  No alcohol or illicit  drug use.   FAMILY HISTORY:  Father had what sounds to be dilated cardiomyopathy and  died due to complications due to alcohol; mother's history is not known  to her.   MEDICATIONS:  Included carvedilol, Synthroid, amlodipine, benazepril,  simvastatin, Lasix, potassium, Keppra, fluocinonide, triamcinolone,  Crestor, ferritin.   ALLERGIES:  She  has no known drug allergies.   REVIEW OF SYSTEMS:  She noted weight loss, weight gain, dizziness,  headaches, seizures, change in eyesight, change in hearing, sore throat,  pain in leg with walking, stroke, home oxygen, bronchitis, wheezing,  joint pain, muscle pain, shortness of breath with exertion and heart  murmur, bleeding problems, depression, anxiety, attention deficit  disorder, blood in stools, frequent urination, ulcer on the right leg,  rashes on both legs, sometimes of foot.  Otherwise the rest of review of  systems was noted to be negative.   PHYSICAL EXAMINATION:  Vital signs:  She has a temperature of 97.9, a  blood pressure of 144/85, a heart rate of 71, respirations of 12.  General:  Well-developed, obese.  No apparent distress.  Head:  Normocephalic, atraumatic.  ENT:  Hearing is grossly intact.  Oropharynx without any erythema or  exudate.  Nares without any drainage or any type of erythema.  Neck:  Supple neck.  No nuchal rigidity.  I did not appreciate any  thyromegaly.  Eyes: pupils were equal, round and reactive to light.  Extraocular  movements were intact.  Pulmonary:  Symmetric expansion, distant breath sounds.  There were no  rales, rhonchi or wheezing auscultated.  Cardiac:  Regular rate and rhythm.  Normal S1-S2.  No murmurs, rubs or  thrills.  I did not appreciate any JVD.  Vascular:  She had palpable pulses in all extremities and she had no  carotid bruit.  Her aorta was not palpable due to her obesity.  Abdomen:  Soft abdomen, nontender, nondistended.  No guarding or  rebound, no hepatosplenomegaly.  She is quite obese.  No obvious masses  are palpable.  Musculoskeletal:  She had 5/5 strength in all  extremities.  Her right bilateral legs have 1+ edema.  On the right leg  she has a couple of healing pretibial ulcers without any erythema or any  type of streaking.  There is no tenderness to light touch.  Bilateral  shins have lipodermatosclerosis.   There is no Kaposi Stemmer sign  bilaterally.  Additionally both legs she has distribution of fat  consistent with lipedema.  Neurological:  Cranial nerves II-XII were intact.  Motor strength was as  above.  Sensation is grossly intact in all extremities.  Skin:  The extremities were as listed above.  Otherwise I did not  appreciate a rash elsewhere on her body.  Psychiatric:  Her judgment appeared to be intact.  Her mood and affect  she was slightly anxious but otherwise appeared to be appropriate for  clinical situation.  Lymphatic:  No cervical, axillary or inguinal lymphadenopathy.   I reviewed about 10 pages of outside documentation.  Appears on  10/15/2010 she had a bilateral lower extremity duplex completed at [facility].  It demonstrated no evidence of deep vein thrombosis.   MEDICAL DECISION MAKING:  This is a 60 year old female with 2 year  history consistent with chronic venous insufficiency.  She is developing  complications of such.  I would classify her at least a C5 on the CEAP  scale as it looks like her ulcers are healing at this point.  She also  has evidence of lipodermatosclerosis.  Her workup should includea  bilateral lower extremity venous insufficiency duplexes as the DVT study  was inadequate for diagnosing true venous insufficiency.  She is to also  continue with local wound care to the ulcer she has and the key  therapeutic element here would be compression.  She already has been  given compression stockings by her dermatologist and by description it  appears she was given medical grade compression stockings so I would  continue with this.  I will have the duplex completed within a month and  then pending those findings and her response to local wound care will  either put her in an Unna boot on the right side and then additionally  consider whether or not she will need ablation of her greater saphenous  vein on both sides pending duplex findings.  As most  insurance companies  require a minimum of 3-6 months of compression, we will start that  process at this point.   Thank you for referring this patient to [place].  We will continue to see her  within the next month.     Fransisco Hertz, MD  Electronically Signed   BLC/MEDQ  D:  12/08/2010  T:  12/08/2010  Job:  [id number]   cc:   Ace Gins, MD

## 2011-05-04 NOTE — H&P (Signed)
NAMEJILLIAM, [name]                 ACCOUNT NO.:  [id number]   MEDICAL RECORD NO.:  [id number]          PATIENT TYPE:  INP   LOCATION:  2621                         FACILITY:  [facility]   PHYSICIAN:  Jackie Plum, M.D.DATE OF BIRTH:  02-08-1951   DATE OF ADMISSION:  09/07/2006  DATE OF DISCHARGE:                                HISTORY & PHYSICAL   CHIEF COMPLAINT:  Visual blurring and headaches.   HISTORY OF PRESENT ILLNESS:  This is 60 year old African American lady who  presented to the ED with the above complaint.  She had gone to [facility] last night and was noted to be very hypertensive, and was sent  home on antihypertensive medications (questionable Benicar).  She came to  the ED today because of visual blurring and headaches.  In the emergency  room, the patient was noted to be very hypertensive with systolic blood  pressure more than 120.  In the emergency room, the patient was seen by  Rhona Leavens who gave her Clonidine 0.1 x1.  CT scan of the head was done  that showed acute, hemorrhagic sulcus/bleed, and she is admitted to the  Hospitalist Service after consulting with Dr. Phoebe Perch who wanted to the  patient to be transferred to [facility] and requested medical evaluation  while she was at [facility].   PAST MEDICAL HISTORY:  1. 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  2. History of hypothyroidism.  3. History of hypertension and has been noncompliant to her      antihypertensive regimen.  4. The patient was admitted in October 2001 for GI bleed.   MEDICATIONS:  1. Synthroid.  2. Benicar.   ALLERGIES:  No medication allergies.   FAMILY HISTORY:  Positive for heart disease, hypertension and diabetes.   SOCIAL HISTORY:  The patient does not smoke cigarettes or drink alcohol.  She lives at home with her family.   REVIEW OF SYSTEMS:  She denies any fever, chills, weight gain/loss.  No heat  or cold intolerance.  No  dysuria, frequency or micturition.  No abdominal  pain, nausea or vomiting, diarrhea, constipation.  She denies any  palpitations.  No orthopnea, PND.  No extremity swelling.  The rest of the  systems were essentially unremarkable except for complaint of headaches and  visual blurring.   PHYSICAL EXAMINATION:  VITAL SIGNS:  At the time of evaluation, the  patient's blood pressure came down to 150/93.  Pulse rate was 52 per minute,  Respirations 18, temperature 97.02, O2 97%.  GENERAL:  The patient was complaining of acute __________ .  HEENT:  Normocephalic, atraumatic.  Pupils equal, round and reactive to  light.  Extraocular movements intact.  Oropharynx moist.  NECK:  Supple.  No JVD.  No carotid bruit was appreciated.  LUNGS:  Clear to auscultation.  CARDIAC:  The patient was mild tachycardic__________ .  ABDOMEN:  Obese, soft, bowel sounds present.  EXTREMITIES:  No cyanosis.  No edema.  CNS:  Normal, nonfocal.  NEUROLOGICAL:  Motor and sensory modalities were intact.  She had a right  visual field cut.  The patient was alert and oriented x3.   LABORATORY DATA:  Blood pressure WBC 4.9, hemoglobin 14.0, hematocrit 41.7,  MCV 12.0, platelets 242.  Sodium 139, potassium 3.4, chloride 102, CO2 28,  glucose 125, BUN 15, creatinine 1.21, calcium 9.3.  CT of the head,  preliminary report was positive for left frontal hemorrhage.  The final  official report is pending.   IMPRESSION:  1. A cranial hemorrhage.  2. Accelerated hypertension, improved.  3. Noncompliance to medications.  4. Hypothyroidism.  5. Visual deficit, probable related to #1.  However, the patient will need      further evaluation of visual loss in __________ .   PLAN:  The patient was admitted to Neuro ICU at [facility].  Dr. Phoebe Perch has  requested transfer to [facility] at this time, and he will see the  patient when the patient gets to [facility].  We will get patient started on  gentle blood pressure control  with antihypertensives.  Will check a lipid  panel.  Will follow up on the official report of the CT scan.  __________  pain control.  The patient does not have any problems with swallowing at  this time, and she can get a swallowing evaluation when she gets to [facility].  Target blood pressure should be around 150/90.  We have to discuss an new  channel blocker antihypertensive as needed.      Jackie Plum, M.D.  Electronically Signed     GO/MEDQ  D:  09/07/2006  T:  09/09/2006  Job:  [id number]

## 2011-05-04 NOTE — Discharge Summary (Signed)
[name], [name]                 ACCOUNT NO.:  [id number]   MEDICAL RECORD NO.:  [id number]          PATIENT TYPE:  INP   LOCATION:  4742                         FACILITY:  [facility]   PHYSICIAN:  Deirdre Peer. Polite, M.D. DATE OF BIRTH:  1951/07/10   DATE OF ADMISSION:  09/07/2006  DATE OF DISCHARGE:                                 DISCHARGE SUMMARY   DIAGNOSIS:  1. Intracerebral hemorrhage, left occipital distribution, 2.6 cm by CT,      presumed secondary to a hypertensive urgency, can not exclude vascular      abnormality.  Seen in consultation by Neuro; cleared for discharge.      Please note, patient has had followup CT which showed stability.      Patient will require outpatient MRI in approximately 6-8 weeks to rule      out vascular abnormality.  2. Hypertension, controlled currently.  Undergoing evaluation for      secondary cause of hypertension.  At this time, MRA is negative for any      renal vessel disease.  Other studies are pending and will be followed      up in a dictation addendum.  3. Hypothyroidism, profound.  TSH approximately 59.  Patient had been      taking herbal medicines and had been noncompliant with her prescribed      medication.  Synthroid dose at time of discharge 75 mcg.  Will require      a followup TSH.  4. Hypercholesterolemia.  Total cholesterol 288, LDL 168.  Recommend      treatment.  5. Medical noncompliance.   DISCHARGE MEDICATIONS:  1. Synthroid 75 mcg daily.  2. Lisinopril 10 mg daily.  3. Zocor 20 mg daily.   DISPOSITION:  Patient to be discharged home in stable condition.  Will  require outpatient followup with her primary MD in 1 week.  Patient is to  comply with all medicines.  Patient needs to followup with neurosurgery in  approximately 6-8 weeks for a followup MRI with Dr. Phoebe Perch of neurosurgery.   STUDIES:  Patient had a CT of the head on 09/22, which shows 2.6-cm left  occipital parenchymal hemorrhage.  Parenchymal hemorrhage  can occur with a  history of hypertension.  Underlying vascular malformation not excluded.  Patient had a CT of the head with stable left occipital parenchymal  hemorrhage, stable intraventricular extension of hemorrhage.  Patient had an  MR of the abdomen which showed no vascular abnormality at the renal  arteries.  Patient had a CT of the head on September 10, 2006, stable left  occipital hemorrhage.  TSH 60, total cholesterol 288, LDL 168.  B-met  significant for creatinine of 1.3.   HISTORY OF PRESENT ILLNESS:  60 year old female presented to the ED with  complaint of elevated BP and headache.  In the ED, the patient was  evaluated, had a CT which showed left occipital hemorrhage.  Admission was  deemed necessary for further evaluation and treatment.  Please see dictated  H and P for further details.   PAST MEDICAL HISTORY:  For  admission H and P, includes history of previous  peripheral vision loss in the right eye since 2000.  History of  hypothyroidism, history of hypertension which she has been noncompliant with  her hypertensive.   MEDICATIONS ON ADMISSION:  None.   ALLERGIES:  None.   FAMILY HISTORY:  Father for heart disease, hypertension, diabetes.   HOSPITAL COURSE:  1. Patient was admitted to an ICU for further evaluation and treatment of      intracerebral hemorrhage.  Patient was seen in consultation by      neurosurgery, Dr. Phoebe Perch.  No invasive procedures were recommended.      Patient did have followup CT, showed stability of intracerebral      hemorrhage.  Patient's BP was rather well controlled with 1 medication      during this hospitalization.  There were no other bouts of hypertensive      urgency.  Because of the patient's BP reported systolics greater than      200 and diastolics greater than 100, secondary evaluation for      hypertension was undertaken.  This evaluation is essentially all      pending at the time of this dictation, except for the  patient's MRA of      her renal arteries which were within normal limits.  Further studies      are pending and will be provided in an addendum.  2. High cholesterol:  Recommend treatment.  3. Elevated TSH:  Consistent with profound hypothyroidism.  Recommend      treatment.  Patient had been taking herbal medicines that she felt were      appropriately treating her medical problems, i.e. hypertension,      hypothyroidism.  I have reviewed this with her and her daughter in      extensive detail that it is recommended that she take her medications      prescribed to her from a medical doctor.  Patient did show      understanding of the information I provided her.  Again, this is an      interim discharge.  Further studies in reference to her evaluation of      hypertension, i.e. secondary cause of hypertension, are pending and      will be provided in an addendum.      Deirdre Peer. Polite, M.D.  Electronically Signed     RDP/MEDQ  D:  09/10/2006  T:  09/11/2006  Job:  [id number]

## 2011-05-04 NOTE — Procedures (Signed)
[place]  Patient:    [name], DAVOLI Visit Number: [id number] MRN: [id number]          Service Type: END Location: ENDO Attending Physician:  Charna Elizabeth Dictated by:   Anselmo Rod, M.D. Proc. Date: 05/29/02 Admit Date:  05/29/2002   CC:         Velna Hatchet, M.D.   Procedure Report  PROCEDURE PERFORMED:  Colonoscopy.  ENDOSCOPIST:  Anselmo Rod, M.D.  INSTRUMENT USED:  Olympus video colonoscope.  INDICATION FOR PROCEDURE:  Iron deficiency anemia with an unrevealing EGD, in a 60 year old African-American female.  Rule out colonic polyps, masses, hemorrhoids, etc.  PREPROCEDURE PREPARATION:  Informed consent was procured from the patient. The patient was fasted for 8 hours prior to procedure, and prepped with a bottle of magnesium citrate and a gallon of NuLytely the night prior to the procedure.  PREPROCEDURE PHYSICAL EXAMINATION:  VITAL SIGNS:  Stable.  NECK:  Supple.  CHEST:  Clear to auscultation.  HEART:  S1, S2 regular.  ABDOMEN:  Soft, with normal bowel sounds.  DESCRIPTION OF PROCEDURE:  The patient was placed in the left lateral decubitus position.  No other sedation was used.  Once the patient was adequately sedated and maintained on low-flow oxygen with continuous cardiac monitoring, the Olympus video colonoscope was advanced into rectum to the cecum without difficulty.  There was some visible stool in the colon.  Multiple washes were done.  A small patch of erythema was seen at 50 cm.  This was biopsied with two cold biopsies.  The patient had small nonbleeding internal and external hemorrhoids.  She tolerated the procedure well without complications.  IMPRESSION:  Normal colonoscopy, except for small erythematous patch biopsied at 50 cm.  Small nonbleeding internal and external hemorrhoids. Otherwise, normal colonoscopy.  RECOMMENDATIONS:  Await pathology results.  Schedule small bowel follow through.   Celiac panel to be checked.  Serial CBCs on an outpatient basis. Dictated by:   Anselmo Rod, M.D. Attending Physician:  Charna Elizabeth DD:  05/29/02 TD:  05/31/02 Job: [id number]

## 2011-05-04 NOTE — H&P (Signed)
[facility]  Patient:    [name], [name]                          MRN: [id number] Adm. Date:  [id number] Attending:  Gwynneth Aliment                         History and Physical  NO DICTATION. DD:  10/06/00 TD:  10/07/00 Job: [id number]

## 2011-05-04 NOTE — Op Note (Signed)
NAME:  [name], [name]                             ACCOUNT NO.:  [id number]   MEDICAL RECORD NO.:  [id number]                   PATIENT TYPE:  AMB   LOCATION:  ENDO                                 FACILITY:  [facility]   PHYSICIAN:  Anselmo Rod, M.D.               DATE OF BIRTH:  [birth year]   DATE OF PROCEDURE:  04/17/2004  DATE OF DISCHARGE:                                 OPERATIVE REPORT   PROCEDURE PERFORMED:  Screening colonoscopy.   ENDOSCOPIST:  Anselmo Rod, M.D.   INSTRUMENT USED:  Olympus video colonoscope.   INDICATIONS FOR PROCEDURE:  A 60 year old [place] American female undergoing  a screening colonoscopy.  The patient has had occasional hematochezia.  Rule  out colonic polyps, masses, etc.   PREPROCEDURE PREPARATION:  Informed consent was procured from the patient.  The patient was fasted for eight hours prior to the procedure and prepped  with a bottle of magnesium citrate and a gallon of GoLYTELY the night prior  to the procedure.   PREPROCEDURE PHYSICAL:  VITAL SIGNS:  The patient had stable vital signs.  NECK:  Supple.  CHEST: Clear to auscultation.  S1 and S2 regular.  ABDOMEN: Soft with normal bowel sounds.   DESCRIPTION OF PROCEDURE:  The patient was placed in the left lateral  decubitus position, sedated with 70 mg of Demerol and 7 mg of Versed  intravenously.  Once the patient was adequately sedated and maintained on  low flow oxygen and continuous cardiac monitoring, the Olympus video  colonoscope was advanced from the rectum to the cecum.  The appendicular  orifice and the ileocecal valve were clearly visualized and photographed.  The patient had an excellent prep.  No masses, polyps, erosions, ulcerations  or diverticula was seen.  Small internal hemorrhoids were appreciated on  retroflexion of the rectum.  The patient tolerated the procedure well  without immediate complications.   IMPRESSION:  Normal colonoscopy to the cecum except for small  internal  hemorrhoids.  No masses, polyps or diverticulosis noted.   RECOMMENDATIONS:  1. Continue high fiber diet with liberal fluid intake.  2. Outpatient follow up in the next two weeks for further recommendations.  3. Repeat colorectal cancer screening in the next 10 years unless the     patient develops any abnormal symptoms in the interim.                                               Anselmo Rod, M.D.    JNM/MEDQ  D:  04/17/2004  T:  04/17/2004  Job:  [id number]   cc:   Candyce Churn. Allyne Gee, M.D.  [address]  [address]  [place]  [place] [address]  Fax: [phone]

## 2011-05-04 NOTE — Procedures (Signed)
[place]  Patient:    [name], [name] Visit Number: [id number] MRN: [id number]          Service Type: END Location: ENDO Attending Physician:  Charna Elizabeth Dictated by:   Anselmo Rod, M.D. Proc. Date: 05/29/02 Admit Date:  05/29/2002   CC:         Velna Hatchet, M.D.   Procedure Report  PROCEDURE PERFORMED:  Esophagogastroduodenoscopy.  ENDOSCOPIST:  Anselmo Rod, M.D.  INSTRUMENT USED:  Olympus video panendoscope.  INDICATION FOR PROCEDURE:  History of iron deficiency anemia, with hemoglobin down to 8.9 g/dL, in a 60 year old African-American female. Rule out peptic ulcer disease, esophagitis, gastritis, etc.  PREPROCEDURE PREPARATION:  Informed consent was procured from the patient. The patient was fasted for 8 hours prior to procedure.  PREPROCEDURE PHYSICAL EXAMINATION:  VITAL SIGNS:  Stable.  NECK:  Supple.  CHEST:  Clear to auscultation.  HEART:  S1, S2 regular.  ABDOMEN:  Soft, with normal bowel sounds.  DESCRIPTION OF PROCEDURE:  The patient was placed in the left lateral decubitus position and sedated with 100 mg Demerol and 10 mg of Versed intravenously. Once the patient was adequately sedated and maintained on low-flow oxygen with continuous cardiac monitoring, the Olympus video panendoscope was advanced through the mouthpiece, over the tongue, into the esophagus under direct vision.  The entire esophagus appeared normal and without lesions.  The scope was then advanced into the stomach and the proximal small bowel.  The entire EGD was normal.  IMPRESSION:  Normal EGD; no ulcers, masses or polyps seen.  RECOMMENDATIONS:  Proceed with a colonoscopy at this time. Dictated by:   Anselmo Rod, M.D. Attending Physician:  Charna Elizabeth DD:  05/29/02 TD:  05/31/02 Job: [id number]

## 2011-05-04 NOTE — Consult Note (Signed)
[facility]  Patient:    [name], [name]                          MRN: [id number] Proc. Date: 10/06/00 Adm. Date:  [id number] Attending:  Gwynneth Aliment CC:         Dorothyann Peng, M.D.  Erskine Speed, M.D.   Consultation Report  REASON FOR CONSULTATION:  Ms. Suder is a 60 year old female who presented to the emergency room with gastrointestinal bleeding. She reports that this afternoon she began feeling nausea. She went to the bathroom and vomited dark blood. This happened twice, the second time it was lighter in color and then she had 2 bloody bowel movements. She thinks she passed out. She called EMS and was brought to the emergency room where she was orthostatic and found to have a hemoglobin of 9.7. In addition, her BUN was 37 with a creatinine of 0.8 implying an upper GI bleed. She has been taking Bufferin for headaches in the last several days. She denies prior history of ulcer disease. Ten years ago, she had some lower gastrointestinal bleeding that apparently was not extensively evaluated but did not recur. She denies dysphagia, chronic heartburn, history of ulcers, upper or lower abdominal pain or weight loss.  PAST MEDICAL HISTORY:  Pertinent for hypertension. She is supposed to be taking medications for this including Lotensin but she is not compliant. She says that the Lotensin makes her blood pressure go up. She also has an IUD in place that is due to be removed apparently for several years.  ALLERGIES:  None reported.  FAMILY HISTORY:  Negative for ulcers, gallstones, inflammatory bowel disease or gastrointestinal neoplasia.  SOCIAL HISTORY:  She is gravida 5, para 4, not employed outside of the home, nonsmoker, nondrinker.  REVIEW OF SYSTEMS:  GENERAL:  No weight loss or night sweats.  ENDOCRINE:  No history of diabetes or thyroid problems. SKIN:  No rash or pruritus. EYES:  No icterus or change in vision. ENT:  No aphthous ulcers or  chronic sore throat. RESPIRATORY:  No shortness of breath, cough or wheezing. CARDIAC:  No chest pain, palpitations or history of valvular heart disease. Hypertension is recently diagnosed and not currently being treated. GI:  As above. GU:  No dysuria or hematuria. The remainder of review of systems is negative.  PHYSICAL EXAMINATION:  GENERAL:  She is a well-developed, obese, adult female in no acute distress.  VITAL SIGNS:  Afebrile. No longer orthostatic after hydration with a blood pressure of 107/50 and a pulse of 80.  SKIN:  Normal.  HEENT:  Eyes are anicteric. Oropharynx is unremarkable. Dentition is poor.  NECK:  Supple without thyromegaly. There is no cervical or inguinal adenopathy.  CHEST:  Clear.  HEART:  Sounds are regular rate and rhythm without murmurs, rubs or gallops.  ABDOMEN:  Obese and soft without mass, tenderness, organomegaly. There is a healed cesarean section scar.  RECTAL:  Reveals maroon blood.  EXTREMITIES:  Without cyanosis, clubbing or edema or rash. Dorsalis pedis pulse 2+ bilaterally.  IMPRESSION:  Probable upper GI bleed in a 60 year old female who has been taking aspirin products I suspect ulcer or gastritis. I think she needs an urgent endoscopy in light of the degree of bleeding with decreased hemoglobin and orthostatic changes. If sufficient pathology is not found on her upper endoscopy to explain the findings we will proceed with the colonoscopy tomorrow. Please see the orders. DD:  10/06/00 TD:  10/07/00 Job: [id number]

## 2011-05-04 NOTE — H&P (Signed)
[facility]  Patient:    [name], [name]                          MRN: [id number] Adm. Date:  [id number] Attending:  Gwynneth Aliment CC:         Dorothyann Peng, M.D.   History and Physical  PROBLEM #1:  GI bleed.  SUBJECTIVE:  This 60 year old patient woke up this morning, the day of admission, October 06, 2000, and did not feel well and had mild nausea.  At 1 p.m., she was in the bathroom to have a bowel movement and developed nausea, vomiting and vomited up blood and nearly fainted.  She went to pass her bowels a second time but did not have a stool but just passed blood.  There has been no abdominal pain or no fever.  She had no loss of consciousness.  Since she was vomiting blood and also passing blood per rectum, she came to [facility] per EMS at about 1:30 p.m.  Her ulcerogenic risk factors included Bufferin, but she only took two Bufferin on October 04, 2000, three Bufferin on October 05, 2000 and none today.  She has no history of repeated use of anti-inflammatory medicines.  Ten years ago, she had a similar problem with blood in the stool, was seen at a walk-in clinic, had flexible sigmoidoscopy without diagnosis and without followup.  PROBLEM #2:  High blood pressure.  SUBJECTIVE:  Patient has been going to [facility] but has papers filled out to be a patient of Dr. Dorothyann Peng.  She was on Lotensin but she said it made her blood pressure get worse, so for the last two weeks, she has not taken any medicine for her blood pressure.  PAST HISTORY:  Patient is a gravida 5, para 4, one C-section.  FAMILY HISTORY:  Father died of an MI and high blood pressure at age 49. Mother is alive at age 56 with diabetes.  She has a 83 year old daughter initially on insulin, now on pills for diabetes.  SOCIAL HISTORY:  Nonsmoker.  Non-alcohol use.  She does not work outside of the home.  ALLERGIES:  None known.  REVIEW OF SYSTEMS:  GU:   Patient had an IUD many years ago and in 1993, she said they tried to remove it but the string broke and she has not done anything about it in all these years.  She has not had menses since 1999.  OBJECTIVE  VITAL SIGNS:  Temperature is 98.  Supine blood pressure taken at 7:27 p.m. was 107/50, pulse of 80,  sitting blood pressure 116/74, pulse of 94, and standing blood pressure of 135/83, pulse of 101.  HEENT:  Head was normal.  Eyes show pupils to be equal, round and reactive to light.  The extraocular muscles were intact.  ENT exam was negative.  NECK:  Normal.  The carotids are 2+ without bruit.  Thyroid was negative.  The range of motion of the neck was full and supple.  There were no masses.  CHEST:  Clear to auscultation and percussion.  CARDIOVASCULAR:  Rate and rhythm are normal without murmur.  BREASTS:  Exam was unremarkable.  ABDOMEN:  Soft, flat and nontender.  There was no organomegaly or abnormal masses.  Bowel sounds are normal.  There are no abdominal bruits.  The patient was obese.  RECTAL:  Rectal revealed dark blood, typical for colon bleeding.  There was no stool.  There were no masses.  There was no pain.  PELVIC:  Exam is deferred.  SKIN:  Unremarkable.  MUSCULOSKELETAL:  Joint survey unremarkable.  NEUROLOGIC:  Unremarkable.  DISTAL CIRCULATION:  Unremarkable.  ASSESSMENT:  History of vomiting blood but exam more compatible with colon bleed, etiology unknown.  History of high blood pressure.  PLAN:  Admit to a monitor bed, IV fluids, follow hemoglobin and hematocrit, type and cross-match.  GI consulted.  Admit to Dr. Allyne Gee. DD:  10/06/00 TD:  10/07/00 Job: [id number]

## 2011-09-10 LAB — CBC
HCT: 37.7
Hemoglobin: 12.7
MCHC: 33.8
MCV: 90.9
Platelets: 195
RBC: 4.14
RDW: 15.6 — ABNORMAL HIGH
WBC: 4.8

## 2011-09-10 LAB — DIFFERENTIAL
Basophils Absolute: 0
Basophils Relative: 1
Eosinophils Absolute: 0.1
Eosinophils Relative: 3
Lymphocytes Relative: 32
Lymphs Abs: 1.5
Monocytes Absolute: 0.3
Monocytes Relative: 7
Neutro Abs: 2.7
Neutrophils Relative %: 57

## 2011-09-10 LAB — POCT I-STAT, CHEM 8
BUN: 18
Calcium, Ion: 1.13
Chloride: 104
Creatinine, Ser: 1.7 — ABNORMAL HIGH
Glucose, Bld: 122 — ABNORMAL HIGH
HCT: 41
Hemoglobin: 13.9
Potassium: 4.5
Sodium: 138
TCO2: 29

## 2011-09-10 LAB — POCT CARDIAC MARKERS
CKMB, poc: 4.7
CKMB, poc: 4.8
Myoglobin, poc: 173
Myoglobin, poc: 220
Operator id: 257131
Operator id: 265201
Troponin i, poc: <0.05
Troponin i, poc: <0.05

## 2011-10-04 ENCOUNTER — Other Ambulatory Visit: Payer: Self-pay | Admitting: Cardiovascular Disease

## 2011-11-03 ENCOUNTER — Other Ambulatory Visit: Payer: Self-pay | Admitting: Cardiovascular Disease

## 2011-11-05 ENCOUNTER — Other Ambulatory Visit: Payer: Self-pay | Admitting: *Deleted

## 2011-11-05 MED ORDER — CARVEDILOL 25 MG PO TABS: 25.0000 mg | ORAL_TABLET | Freq: Two times a day (BID) | ORAL | Status: DC

## 2011-11-05 NOTE — Telephone Encounter (Signed)
Per pt is aware of new number and address. Pt is aware to call back to make appt. for refills to be made. Fax Received. Refill Completed. Akane Tessier Chowoe (M.A)

## 2011-11-28 ENCOUNTER — Ambulatory Visit (INDEPENDENT_AMBULATORY_CARE_PROVIDER_SITE_OTHER): Payer: Self-pay | Admitting: Surgery

## 2011-11-28 ENCOUNTER — Encounter (INDEPENDENT_AMBULATORY_CARE_PROVIDER_SITE_OTHER): Payer: Self-pay | Admitting: Surgery

## 2011-11-28 VITALS — BP 160/94 | HR 68 | Temp 97.6°F | Resp 16 | Ht 64.0 in | Wt 292.8 lb

## 2011-11-28 DIAGNOSIS — K648 Other hemorrhoids: Secondary | ICD-10-CM | POA: Insufficient documentation

## 2011-11-28 NOTE — Patient Instructions (Signed)

## 2011-11-28 NOTE — Progress Notes (Signed)
Patient ID: [name], female   DOB: 07-01-1951, 60 y.o.   MRN: [id number]  Chief Complaint  Patient presents with  . Other    new pt- eval int hems    HPI [name] is a 60 y.o. female. HPI The patient presents at the request of Dr. Duanne Guess due to rectal bleeding. She has had episodes of dark blood per rectum that filled the commode after bowel movements. She has not had any significant amount of rectal pain. She denies any abdominal pain. Her last colonoscopy is unknown. She denies any nausea or vomiting. She has had 2 or 3 episodes of rectal bleeding which had stopped. She has been given suppositories for hemorrhoidal disease.  Past Medical History  Diagnosis Date  . Stroke   . Seizures   . Depression   . Anemia   . Hypertension   . Thyroid disease     hypothyroidism  . Edema     lower extremities  . Ulcer   . Vitamin D deficiency   . Hematochezia   . Hemorrhoid   . Hyperlipidemia   . Emphysema of lung   . CHF (congestive heart failure)   . Heart murmur     Past Surgical History  Procedure Date  . Tonsillectomy   . Vein surgery   . Cesarean section   . Iud removal     Family History  Problem Relation Age of Onset  . Heart disease Father     Social History History  Substance Use Topics  . Smoking status: Never Smoker   . Smokeless tobacco: Not on file  . Alcohol Use: No    No Known Allergies  Current Outpatient Prescriptions  Medication Sig Dispense Refill  . amLODipine (NORVASC) 5 MG tablet TAKE ONE TABLET BY MOUTH EVERY DAY  30 tablet  5  . carvedilol (COREG) 25 MG tablet TAKE ONE TABLET BY MOUTH TWICE DAILY  60 tablet  4  . Cholecalciferol (VITAMIN D-3 PO) Take 2,000 Units by mouth daily.        . Cyanocobalamin (VITAMIN B-12 CR PO) Take by mouth daily.        . furosemide (LASIX) 40 MG tablet Take 40 mg by mouth daily.        Marland Kitchen levothyroxine (SYNTHROID, LEVOTHROID) 112 MCG tablet Take 112 mcg by mouth daily.        . Multiple Vitamin  (MULTIVITAMIN) capsule Take 1 capsule by mouth daily.        . potassium chloride (KLOR-CON) 10 MEQ CR tablet Take 10 mEq by mouth daily.          Review of Systems Review of Systems  Constitutional: Negative.   HENT: Negative.   Eyes: Negative.   Respiratory: Negative.   Cardiovascular: Negative.   Gastrointestinal: Positive for blood in stool. Negative for abdominal pain and constipation.  Genitourinary: Negative.   Musculoskeletal: Negative.   Neurological: Negative.   Hematological: Negative.   Psychiatric/Behavioral: Negative.     Blood pressure 160/94, pulse 68, temperature 97.6 F (36.4 C), temperature source Temporal, resp. rate 16, height 5\' 4"  (1.626 m), weight 292 lb 12.8 oz (132.813 kg).  Physical Exam Physical Exam  Constitutional: She appears well-developed and well-nourished.  HENT:  Head: Normocephalic and atraumatic.  Eyes: EOM are normal. Pupils are equal, round, and reactive to light.  Neck: Normal range of motion. Neck supple.  Abdominal: Soft.  Genitourinary:       Anal canal is normal. Tone normal. Anoscopy is performed.  She has 3 column grade 2 internal hemorrhoids without signs of bleeding. No anal fissure.  Skin: Skin is warm and dry.    Data Reviewed  Assessment    Rectal bleeding  Grade 2 internal hemorrhoids    Plan    The rectal bleeding she having is not typical of hemorrhoid bleeding. She is unsure we're last colonoscopy was done and we will see if we can get her records from her gastroenterologist. I think her disease is amenable to sclerotherapy in the office and she will return in about 3 weeks for that. I recommend a high-fiber diet increase water intake and to continue suppositories she received from Dr. Duanne Guess as needed.       Amran Malter A. 11/28/2011, 3:33 PM

## 2011-12-04 ENCOUNTER — Ambulatory Visit (INDEPENDENT_AMBULATORY_CARE_PROVIDER_SITE_OTHER): Payer: Medicaid Other | Admitting: Cardiovascular Disease

## 2011-12-04 ENCOUNTER — Encounter: Payer: Self-pay | Admitting: Cardiovascular Disease

## 2011-12-04 DIAGNOSIS — I5032 Chronic diastolic (congestive) heart failure: Secondary | ICD-10-CM | POA: Insufficient documentation

## 2011-12-04 DIAGNOSIS — I509 Heart failure, unspecified: Secondary | ICD-10-CM

## 2011-12-04 MED ORDER — CARVEDILOL 25 MG PO TABS: 25.0000 mg | ORAL_TABLET | Freq: Two times a day (BID) | ORAL | Status: DC

## 2011-12-04 MED ORDER — AMLODIPINE BESYLATE 5 MG PO TABS: 5.0000 mg | ORAL_TABLET | Freq: Every day | ORAL | Status: DC

## 2011-12-04 MED ORDER — FUROSEMIDE 40 MG PO TABS: 40.0000 mg | ORAL_TABLET | ORAL | Status: DC | PRN

## 2011-12-04 MED ORDER — POTASSIUM CHLORIDE 10 MEQ PO TBCR: 10.0000 meq | EXTENDED_RELEASE_TABLET | ORAL | Status: DC | PRN

## 2011-12-04 NOTE — Assessment & Plan Note (Signed)
[name] has a history of congestive heart failure. We do not have a recent echocardiogram I suspect she is mostly diastolic congestive heart failure. We will have her take her Lasix and potassium 2-3 times every week. We will get a repeat echocardiogram.  She'll be seeing Dr. Duanne Guess next month and will be getting her blood work at that time.

## 2011-12-04 NOTE — Progress Notes (Signed)
[name] Date of Birth  02/21/1951 [facility] [address]    [address] [place], [place]  [address] [phone]  Fax  [phone]  History of Present Illness:  [name] is a 60 year old female with history of hypertension, congestive heart failure, and hypothyroidism. I've seen her before in the past. I do not have those records currently.  She's having lots of problems with her granddaughter and this causing lots of stress. As a result she's been eating more and has gained some weight. She has not been exercising.  She's been having some problems with some internal hemorrhoids with subsequent bleeding. She's not having any active cardiac problems. She takes her Lasix and potassium on an as-needed basis which is typically is 1-2 times a week. She does have some shortness breath with exertion but she denies any chest pain.  Current Outpatient Prescriptions on File Prior to Visit  Medication Sig Dispense Refill  . amLODipine (NORVASC) 5 MG tablet TAKE ONE TABLET BY MOUTH EVERY DAY  30 tablet  5  . carvedilol (COREG) 25 MG tablet TAKE ONE TABLET BY MOUTH TWICE DAILY  60 tablet  4  . Cholecalciferol (VITAMIN D-3 PO) Take 2,000 Units by mouth daily.        . Cyanocobalamin (VITAMIN B-12 CR PO) Take by mouth daily.        . furosemide (LASIX) 40 MG tablet Take 40 mg by mouth as needed.       Marland Kitchen levothyroxine (SYNTHROID, LEVOTHROID) 112 MCG tablet Take 112 mcg by mouth daily.        . Multiple Vitamin (MULTIVITAMIN) capsule Take 1 capsule by mouth daily.        . potassium chloride (KLOR-CON) 10 MEQ CR tablet Take 10 mEq by mouth as needed.         No Known Allergies  Past Medical History  Diagnosis Date  . Stroke   . Seizures   . Depression   . Anemia   . Hypertension   . Thyroid disease     hypothyroidism  . Edema     lower extremities  . Ulcer   . Vitamin D deficiency   . Hematochezia   . Hemorrhoid   . Hyperlipidemia   . Emphysema of lung   . CHF  (congestive heart failure)   . Heart murmur     Past Surgical History  Procedure Date  . Tonsillectomy   . Vein surgery   . Cesarean section   . Iud removal     History  Smoking status  . Never Smoker   Smokeless tobacco  . Not on file    History  Alcohol Use No    Family History  Problem Relation Age of Onset  . Heart disease Father     Reviw of Systems:  Reviewed in the HPI.  All other systems are negative.  Physical Exam: BP 179/107  Pulse 65  Ht 5\' 4"  (1.626 m)  Wt 292 lb (132.45 kg)  BMI 50.12 kg/m2 The patient is alert and oriented x 3.  The mood and affect are normal.   Skin: warm and dry.  Color is normal.    HEENT:   Normocephalic/atraumatic. There is no JVD. Her carotids are normal.  Lungs: Lungs are clear to auscultation.   Heart: Regular rate S1-S2. She has a soft systolic murmur.    Abdomen: Her abdomen is obese. Has good bowel sounds. There is no hepatosplenomegaly.  Extremities:  1+ pitting edema  Neuro:  The exam is nonfocal.    ECG: Normal sinus rhythm. She has left ventricular hypertrophy with QRS widening.  Assessment / Plan:

## 2011-12-04 NOTE — Patient Instructions (Addendum)
Call Dwan Bolt [phone] for help with teenager issues.  Your physician recommends that you schedule a follow-up appointment in: 1  Month  Your physician has requested that you have an echocardiogram. Echocardiography is a painless test that uses sound waves to create images of your heart. It provides your doctor with information about the size and shape of your heart and how well your heart's chambers and valves are working. This procedure takes approximately one hour. There are no restrictions for this procedure.   Your physician recommends that you return for lab work in: 1 month / bmp

## 2011-12-21 ENCOUNTER — Ambulatory Visit (INDEPENDENT_AMBULATORY_CARE_PROVIDER_SITE_OTHER): Payer: Medicaid Other | Admitting: Surgery

## 2011-12-21 ENCOUNTER — Encounter (INDEPENDENT_AMBULATORY_CARE_PROVIDER_SITE_OTHER): Payer: Self-pay | Admitting: Surgery

## 2011-12-21 DIAGNOSIS — K649 Unspecified hemorrhoids: Secondary | ICD-10-CM

## 2011-12-21 NOTE — Patient Instructions (Signed)
Expect some mild bleeding and soreness for the next week.  Call if pain increases or bleeding becomes heavy.

## 2011-12-21 NOTE — Progress Notes (Signed)
The patient returns today for followup of her hemorrhoid disease. I reviewed her colonoscopy report which showed internal hemorrhoids. There is no other cause of lower GI bleeding noted.  Exam: Anal canal shows multiple skin tags.  Impression: Grade 2 internal hemorrhoids  Plan: I discussed sclerotherapy today in the office. Risk of bleeding, infection, and pain were discussed. Banding was also discussed. She would like to proceed with sclerotherapy.  The anal scope was introduced. She had 3 column grade 2 internal hemorrhoids. 1.5 cc of sclerosant were injected into each hemorrhoid without difficulty. The patient tolerated the procedure well. She will return in 3 weeks for recheck. After procedure care structures given.

## 2012-01-02 ENCOUNTER — Ambulatory Visit (HOSPITAL_COMMUNITY): Payer: Medicaid Other | Attending: Cardiovascular Disease | Admitting: Radiology

## 2012-01-02 ENCOUNTER — Other Ambulatory Visit (INDEPENDENT_AMBULATORY_CARE_PROVIDER_SITE_OTHER): Payer: Medicaid Other | Admitting: *Deleted

## 2012-01-02 DIAGNOSIS — I509 Heart failure, unspecified: Secondary | ICD-10-CM

## 2012-01-02 DIAGNOSIS — E039 Hypothyroidism, unspecified: Secondary | ICD-10-CM | POA: Insufficient documentation

## 2012-01-02 DIAGNOSIS — J438 Other emphysema: Secondary | ICD-10-CM | POA: Insufficient documentation

## 2012-01-02 DIAGNOSIS — R0609 Other forms of dyspnea: Secondary | ICD-10-CM | POA: Insufficient documentation

## 2012-01-02 DIAGNOSIS — E669 Obesity, unspecified: Secondary | ICD-10-CM | POA: Insufficient documentation

## 2012-01-02 DIAGNOSIS — R0989 Other specified symptoms and signs involving the circulatory and respiratory systems: Secondary | ICD-10-CM | POA: Insufficient documentation

## 2012-01-02 DIAGNOSIS — E785 Hyperlipidemia, unspecified: Secondary | ICD-10-CM | POA: Insufficient documentation

## 2012-01-02 DIAGNOSIS — I10 Essential (primary) hypertension: Secondary | ICD-10-CM | POA: Insufficient documentation

## 2012-01-02 LAB — BASIC METABOLIC PANEL
BUN: 21 mg/dL (ref 6–23)
CO2: 27 mEq/L (ref 19–32)
Calcium: 9.1 mg/dL (ref 8.4–10.5)
Chloride: 104 mEq/L (ref 96–112)
Creatinine, Ser: 1.1 mg/dL (ref 0.4–1.2)
GFR: 64.23 mL/min (ref >60.00)
Glucose, Bld: 103 mg/dL — ABNORMAL HIGH (ref 70–99)
Potassium: 3.8 mEq/L (ref 3.5–5.1)
Sodium: 139 mEq/L (ref 135–145)

## 2012-01-09 ENCOUNTER — Ambulatory Visit (INDEPENDENT_AMBULATORY_CARE_PROVIDER_SITE_OTHER): Payer: Medicaid Other | Admitting: Cardiovascular Disease

## 2012-01-09 ENCOUNTER — Encounter: Payer: Self-pay | Admitting: Cardiovascular Disease

## 2012-01-09 VITALS — BP 138/92 | HR 65 | Ht 64.0 in | Wt 290.4 lb

## 2012-01-09 DIAGNOSIS — I509 Heart failure, unspecified: Secondary | ICD-10-CM

## 2012-01-09 NOTE — Patient Instructions (Signed)
Your physician wants you to follow-up in: 6 MONTHS  You will receive a reminder letter in the mail two months in advance. If you don't receive a letter, please call our office to schedule the follow-up appointment.  Your physician recommends that you return for lab work in: 6 MONTHS BMP

## 2012-01-09 NOTE — Assessment & Plan Note (Signed)
[name] has chronic diastolic congestive heart failure. She's doing quite a bit better on the Lasix. We'll continue with Lasix. I'll see her again in 6 months for followup office visit. Her echocardiogram reveals well preserved left ventricular systolic function. She has left ventricular hypertrophy.  I think that she'll improve dramatically she continues with a good diet and exercise and weight loss program.

## 2012-01-09 NOTE — Progress Notes (Signed)
[name] Date of Birth  1951-02-18 [facility]     [place] [facility]  [address]    [address]   [address] [place], [place]  [address]    [place], [place]  [address] [phone]  Fax  [phone]  [phone]  Fax [phone]   History of Present Illness:  Problem list: 1. Hypertension 2. Diastolic congestive heart failure 3. Hypothyroid 4. Obesity 5. History of seizures  [name] is a 61 year old female who presented to our office last month with shortness of breath. We started her on Lasix and potassium.  Feels well preserved left ventricular systolic function. She does have mild left ventricular hypertrophy. Her ejection fraction is around 60%.  She is feeling quite a bit better on the Lasix.  A BMP drawn at the time of her echocardiogram reveals that her potassium is 3.8. Her other electrolytes are okay to  She has had some GI bleeding She was found to have some hemorrhoids injected.2  Current Outpatient Prescriptions on File Prior to Visit  Medication Sig Dispense Refill  . amLODipine (NORVASC) 5 MG tablet Take 1 tablet (5 mg total) by mouth daily.  30 tablet  5  . carvedilol (COREG) 25 MG tablet Take 1 tablet (25 mg total) by mouth 2 (two) times daily with a meal.  60 tablet  5  . Cholecalciferol (VITAMIN D-3 PO) Take 2,000 Units by mouth daily.        . Cyanocobalamin (VITAMIN B-12 CR PO) Take by mouth daily.        . furosemide (LASIX) 40 MG tablet Take 1 tablet (40 mg total) by mouth as needed.  30 tablet  3  . levothyroxine (SYNTHROID, LEVOTHROID) 112 MCG tablet Take 112 mcg by mouth daily.        . Multiple Vitamin (MULTIVITAMIN) capsule Take 1 capsule by mouth daily.        . potassium chloride (KLOR-CON) 10 MEQ CR tablet Take 1 tablet (10 mEq total) by mouth as needed.  30 tablet  3    No Known Allergies  Past Medical History  Diagnosis Date  . Stroke   . Seizures   . Depression   . Anemia   . Hypertension   . Thyroid disease    hypothyroidism  . Edema     lower extremities  . Ulcer   . Vitamin D deficiency   . Hematochezia   . Hemorrhoid   . Hyperlipidemia   . Emphysema of lung   . CHF (congestive heart failure)   . Heart murmur     Past Surgical History  Procedure Date  . Tonsillectomy   . Vein surgery   . Cesarean section   . Iud removal     History  Smoking status  . Never Smoker   Smokeless tobacco  . Not on file    History  Alcohol Use No    Family History  Problem Relation Age of Onset  . Heart disease Father     Reviw of Systems:  Reviewed in the HPI.  All other systems are negative.  Physical Exam: BP 138/92  Pulse 65  Ht 5\' 4"  (1.626 m)  Wt 290 lb 6.4 oz (131.725 kg)  BMI 49.85 kg/m2 The patient is alert and oriented x 3.  The mood and affect are normal.   Skin: warm and dry.  Color is normal.    HEENT:   Normocephalic/atraumatic. Her mucous membranes moist. Her neck is supple.  Lungs:  clear   Heart: RR, loud S2    Abdomen: obese, +BS  Extremities:  Trace leg edema  Neuro:  Non focal, gait is normal    ECG:   Assessment / Plan:

## 2012-01-17 ENCOUNTER — Emergency Department (HOSPITAL_COMMUNITY)
Admission: EM | Admit: 2012-01-17 | Discharge: 2012-01-18 | Disposition: A | Discharge status: Home / Self Care | Payer: Medicaid Other | Attending: Emergency Medicine | Admitting: Emergency Medicine

## 2012-01-17 ENCOUNTER — Encounter (HOSPITAL_COMMUNITY): Payer: Self-pay | Admitting: Emergency Medicine

## 2012-01-17 ENCOUNTER — Emergency Department (INDEPENDENT_AMBULATORY_CARE_PROVIDER_SITE_OTHER)
Admission: EM | Admit: 2012-01-17 | Discharge: 2012-01-17 | Disposition: A | Discharge status: Nursing Home (Includes ICF) | Payer: Medicaid Other | Source: Home / Self Care | Attending: Emergency Medicine | Admitting: Emergency Medicine

## 2012-01-17 ENCOUNTER — Encounter (HOSPITAL_COMMUNITY): Payer: Self-pay | Admitting: *Deleted

## 2012-01-17 DIAGNOSIS — H539 Unspecified visual disturbance: Secondary | ICD-10-CM

## 2012-01-17 DIAGNOSIS — G40909 Epilepsy, unspecified, not intractable, without status epilepticus: Secondary | ICD-10-CM | POA: Insufficient documentation

## 2012-01-17 DIAGNOSIS — F329 Major depressive disorder, single episode, unspecified: Secondary | ICD-10-CM | POA: Insufficient documentation

## 2012-01-17 DIAGNOSIS — J438 Other emphysema: Secondary | ICD-10-CM | POA: Insufficient documentation

## 2012-01-17 DIAGNOSIS — F3289 Other specified depressive episodes: Secondary | ICD-10-CM | POA: Insufficient documentation

## 2012-01-17 DIAGNOSIS — H53419 Scotoma involving central area, unspecified eye: Secondary | ICD-10-CM

## 2012-01-17 DIAGNOSIS — Z79899 Other long term (current) drug therapy: Secondary | ICD-10-CM | POA: Insufficient documentation

## 2012-01-17 DIAGNOSIS — I10 Essential (primary) hypertension: Secondary | ICD-10-CM | POA: Insufficient documentation

## 2012-01-17 DIAGNOSIS — Z8673 Personal history of transient ischemic attack (TIA), and cerebral infarction without residual deficits: Secondary | ICD-10-CM | POA: Insufficient documentation

## 2012-01-17 DIAGNOSIS — E785 Hyperlipidemia, unspecified: Secondary | ICD-10-CM | POA: Insufficient documentation

## 2012-01-17 DIAGNOSIS — I509 Heart failure, unspecified: Secondary | ICD-10-CM | POA: Insufficient documentation

## 2012-01-17 DIAGNOSIS — H53459 Other localized visual field defect, unspecified eye: Secondary | ICD-10-CM | POA: Insufficient documentation

## 2012-01-17 DIAGNOSIS — E039 Hypothyroidism, unspecified: Secondary | ICD-10-CM | POA: Insufficient documentation

## 2012-01-17 MED ORDER — TETRACAINE HCL 0.5 % OP SOLN
OPHTHALMIC | Status: AC
  Filled 2012-01-17: qty 2

## 2012-01-17 MED ORDER — FLUORESCEIN SODIUM 1 MG OP STRP
ORAL_STRIP | OPHTHALMIC | Status: AC
  Filled 2012-01-17: qty 1

## 2012-01-17 MED ORDER — CYCLOPENTOLATE HCL 1 % OP SOLN
2.0000 [drp] | Freq: Once | OPHTHALMIC | Status: AC
  Administered 2012-01-18: 2 [drp] via OPHTHALMIC
  Filled 2012-01-17: qty 2

## 2012-01-17 NOTE — ED Notes (Signed)
PT. REPORTS VISUAL CHANGES AT RIGHT EYE THIS EVENING , SEEN AT [facility]  SENT HERE FOR FURTHER EVALUATION , DESCRIBED AS "SEEING SILVER WATER WITH PULSATING SENSATION " AT LEFT EYE . NO BLURRED VISION , ALERT AND ORIENTED ,  EQUAL STRONG GRIPS , SPEECH CLEAR AND NO FACIAL ASYMMETRY.

## 2012-01-17 NOTE — ED Notes (Signed)
Pt transferred to [facility] ED via [facility]

## 2012-01-17 NOTE — ED Notes (Signed)
C/o eye watering, throbbing, states felt like maybe b/p was elevated. States just didn't feel right.  Also states has hx of eye bleeding and rectal bleeding but none of that is happening right now but she is concerned about it.  States she has had has a seizure before, and she worried about that.  Also states she does not have epilepsy.  States she has hx of stroke.

## 2012-01-17 NOTE — ED Provider Notes (Signed)
History     CSN: [id number]  Arrival date & time 01/17/12  2159   First MD Initiated Contact with Patient 01/17/12 2333      Chief Complaint  Patient presents with  . Eye Problem    (Consider location/radiation/quality/duration/timing/severity/associated sxs/prior treatment) HPI Is a 61 year old black female who had the onset of scintillating scotoma earlier this evening. It did not obscure her vision and was presently in the periphery. It was present on the left side of her vision. She has not sure if it was limited to one I was just in the left field of vision. There was no associated pain. She describes it as somewhat like looking through drops of water. It resolved completely after about 15 minutes. It was followed by mild headache unlike prior migraines. The headache is almost completely resolved now. There were no other neurologic changes. She was seen in our urgent care center and sent here for further evaluation. She does have a history of left retinal bleed for which she has had laser surgery.  Past Medical History  Diagnosis Date  . Stroke   . Seizures   . Depression   . Anemia   . Hypertension   . Thyroid disease     hypothyroidism  . Edema     lower extremities  . Ulcer   . Vitamin d deficiency   . Hematochezia   . Hemorrhoid   . Hyperlipidemia   . Emphysema of lung   . CHF (congestive heart failure)   . Heart murmur   . Migraine     Past Surgical History  Procedure Date  . Tonsillectomy   . Vein surgery   . Cesarean section   . Iud removal   . Hemorrhoid surgery     Family History  Problem Relation Age of Onset  . Heart disease Father   . Diabetes Father   . Heart failure Father   . Diabetes Mother   . Thyroid disease Sister   . Diabetes Brother     History  Substance Use Topics  . Smoking status: Never Smoker   . Smokeless tobacco: Not on file  . Alcohol Use: No    OB History    Grav Para Term Preterm Abortions TAB SAB Ect Mult Living                    Review of Systems  All other systems reviewed and are negative.    Allergies  Review of patient's allergies indicates no known allergies.  Home Medications   Current Outpatient Rx  Name Route Sig Dispense Refill  . AMLODIPINE BESYLATE 5 MG PO TABS Oral Take 1 tablet (5 mg total) by mouth daily. 30 tablet 5  . CARVEDILOL 25 MG PO TABS Oral Take 1 tablet (25 mg total) by mouth 2 (two) times daily with a meal. 60 tablet 5  . VITAMIN D-3 PO Oral Take 2,000 Units by mouth daily.      Marland Kitchen VITAMIN B-12 CR PO Oral Take by mouth daily.      . FUROSEMIDE 40 MG PO TABS Oral Take 1 tablet (40 mg total) by mouth as needed. 30 tablet 3  . LEVOTHYROXINE SODIUM 112 MCG PO TABS Oral Take 112 mcg by mouth daily.      . MULTIVITAMINS PO CAPS Oral Take 1 capsule by mouth daily.      Marland Kitchen POTASSIUM CHLORIDE 10 MEQ PO TBCR Oral Take 1 tablet (10 mEq total) by mouth as needed.  30 tablet 3    BP 158/111  Pulse 70  Temp(Src) 98.2 F (36.8 C) (Oral)  SpO2 97%  Physical Exam General: Well-developed, well-nourished female in no acute distress; appearance consistent with age of record HENT: normocephalic, atraumatic Eyes: pupils equal round and reactive to light; extraocular muscles intact; no retinal hemorrhages seen on funduscopic examination; optical discs sharp; no vitreous opacity Neck: supple Heart: regular rate and rhythm Lungs: Normal respiratory effort and excursion Abdomen: soft; nondistended Extremities: No deformity; full range of motion Neurologic: Awake, alert and oriented; motor function intact in all extremities and symmetric; no facial droop Skin: Warm and dry Psychiatric: Normal mood and affect    ED Course  Procedures (including critical care time)    MDM  Cyclogyl was instilled to dilate pupils were funduscopic examination.  The patient's symptomatology is consistent with a migraine aura. Patient has no history of migraine auras, but migraine auras can occur  as a unique event in people with chronic migraines or migraine history. Patient was advised to return for worsening vision, especially visual changes that do not resolve rapidly.       Hanley Seamen, MD 01/18/12 [phone]

## 2012-01-17 NOTE — ED Provider Notes (Signed)
History     CSN: [id number]  Arrival date & time 01/17/12  1901   First MD Initiated Contact with Patient 01/17/12 2041      Chief Complaint  Patient presents with  . Eye Problem    (Consider location/radiation/quality/duration/timing/severity/associated sxs/prior treatment) HPI Comments: Patient reports visual disturbance in her left eye starting around 1830 tonight. lasted for 5 minutes and then resolved after taking a baby aspirin. Patient states that is seemed as if she was "looking through water". Also reports a sensation of pulsing around her eye during this time. No nausea, vomiting, headaches, dysarthria, facial droop, arm or leg weakness. No halos around lights, eye redness, photophobia, visual loss. Patient with history of "bleeding in her left eye" and is status post 2 retinal surgeries for this. Patient also with history of CVA. Had 1 seizure status post stroke, no seizures since.  Patient is a 61 y.o. female presenting with eye problem. The history is provided by the patient.  Eye Problem  This is a new problem. The current episode started 3 to 5 hours ago. There is pain in the left eye. There was no injury mechanism. The patient is experiencing no pain. There is no history of trauma to the eye. She does not wear contacts. Associated symptoms include blurred vision. Pertinent negatives include no numbness, no decreased vision, no discharge, no double vision, no foreign body sensation, no photophobia, no eye redness, no nausea, no vomiting, no tingling, no weakness and no itching. She has tried nothing for the symptoms.    Past Medical History  Diagnosis Date  . Stroke   . Seizures   . Depression   . Anemia   . Hypertension   . Thyroid disease     hypothyroidism  . Edema     lower extremities  . Ulcer   . Vitamin d deficiency   . Hematochezia   . Hemorrhoid   . Hyperlipidemia   . Emphysema of lung   . CHF (congestive heart failure)   . Heart murmur     Past  Surgical History  Procedure Date  . Tonsillectomy   . Vein surgery   . Cesarean section   . Iud removal   . Hemorrhoid surgery     Family History  Problem Relation Age of Onset  . Heart disease Father   . Diabetes Father   . Heart failure Father   . Diabetes Mother   . Thyroid disease Sister   . Diabetes Brother     History  Substance Use Topics  . Smoking status: Never Smoker   . Smokeless tobacco: Not on file  . Alcohol Use: No    OB History    Grav Para Term Preterm Abortions TAB SAB Ect Mult Living                  Review of Systems  Constitutional: Negative for fever.  HENT: Negative for facial swelling.   Eyes: Positive for blurred vision and visual disturbance. Negative for double vision, photophobia, pain, discharge, redness and itching.  Gastrointestinal: Negative for nausea and vomiting.  Skin: Negative for itching and rash.  Neurological: Negative for tingling, facial asymmetry, speech difficulty, weakness, numbness and headaches.    Allergies  Review of patient's allergies indicates no known allergies.  Home Medications   Current Outpatient Rx  Name Route Sig Dispense Refill  . AMLODIPINE BESYLATE 5 MG PO TABS Oral Take 1 tablet (5 mg total) by mouth daily. 30 tablet 5  .  CARVEDILOL 25 MG PO TABS Oral Take 1 tablet (25 mg total) by mouth 2 (two) times daily with a meal. 60 tablet 5  . VITAMIN D-3 PO Oral Take 2,000 Units by mouth daily.      Marland Kitchen VITAMIN B-12 CR PO Oral Take by mouth daily.      . FUROSEMIDE 40 MG PO TABS Oral Take 1 tablet (40 mg total) by mouth as needed. 30 tablet 3  . LEVOTHYROXINE SODIUM 112 MCG PO TABS Oral Take 112 mcg by mouth daily.      . MULTIVITAMINS PO CAPS Oral Take 1 capsule by mouth daily.      Marland Kitchen POTASSIUM CHLORIDE 10 MEQ PO TBCR Oral Take 1 tablet (10 mEq total) by mouth as needed. 30 tablet 3    BP 156/77  Pulse 70  Temp(Src) 98.8 F (37.1 C) (Oral)  Resp 20  SpO2 100%  Physical Exam  Nursing note and vitals  reviewed. Constitutional: She is oriented to person, place, and time. She appears well-developed and well-nourished. No distress.  HENT:  Head: Normocephalic and atraumatic.  Eyes: Conjunctivae and EOM are normal. Pupils are equal, round, and reactive to light. Right eye exhibits no discharge. Left eye exhibits no discharge.  Fundoscopic exam:      The right eye shows no hemorrhage and no papilledema.       The left eye shows no hemorrhage.       Visual acuity right eye and left eye 20/50, 20/40 together. Unable to fully visualize optic disc left side on funduscopic exam. No periorbital swelling.  Neck: Normal range of motion. Neck supple.  Cardiovascular: Normal rate.   Pulmonary/Chest: Effort normal.  Abdominal: She exhibits no distension.  Musculoskeletal: Normal range of motion.  Neurological: She is alert and oriented to person, place, and time. She has normal strength. No cranial nerve deficit or sensory deficit.       Rapid hand movements, finger-nose, heel-to-shin, tandem gait steady. Questionable right upper visual field deficit on confrontation.  Skin: Skin is warm and dry.  Psychiatric: She has a normal mood and affect. Her behavior is normal. Judgment and thought content normal.    ED Course  Procedures (including critical care time)  Labs Reviewed - No data to display No results found.   1. Visual changes     MDM  Previous records reviewed.   Concern for retinal artery or vein occlusion, Transferring the ED for her dilated pupil exam, further evaluation.  Luiz Blare, MD 01/17/12 2222

## 2012-01-17 NOTE — ED Notes (Signed)
Report called to Jill Alexanders RN in triage at [facility] ED

## 2012-01-17 NOTE — ED Notes (Signed)
Visual acuity done w/ corrective lens

## 2012-01-18 ENCOUNTER — Encounter (INDEPENDENT_AMBULATORY_CARE_PROVIDER_SITE_OTHER): Payer: Self-pay | Admitting: Surgery

## 2012-01-18 ENCOUNTER — Encounter (HOSPITAL_COMMUNITY): Payer: Self-pay | Admitting: *Deleted

## 2012-01-18 ENCOUNTER — Ambulatory Visit (INDEPENDENT_AMBULATORY_CARE_PROVIDER_SITE_OTHER): Payer: Medicaid Other | Admitting: Surgery

## 2012-01-18 DIAGNOSIS — K649 Unspecified hemorrhoids: Secondary | ICD-10-CM

## 2012-01-18 NOTE — Progress Notes (Signed)
Subjective:     Patient ID: [name], female   DOB: 08/08/1951, 61 y.o.   MRN: [id number]  HPI The patient returns for followup due to hemorrhoid disease. She underwent sclerotherapy. She denies any pain, burning or itching. She does have some blood when she wipes but no significant amount of blood in the commode.    Review of Systems  Constitutional: Negative.   HENT: Negative.   Eyes: Negative.   Gastrointestinal: Positive for anal bleeding. Negative for blood in stool.       Objective:   Physical Exam  Genitourinary:          Assessment:     Internal hemorrhoids grade 2     Plan:     The patient does not need any other treatments.  I discussed surgical intervention  And risks.  She is comfortable with watching.  She has had a recent colonoscopy which was normal.  I told her to call if she has more blood in her stool or if her BM change.

## 2012-01-18 NOTE — Patient Instructions (Signed)
Follow up if symptoms worsens. 

## 2012-01-18 NOTE — ED Notes (Signed)
No rx given, pt voiced understanding to f/u with PCP 

## 2012-02-25 ENCOUNTER — Other Ambulatory Visit: Payer: Self-pay | Admitting: Family Medicine

## 2012-02-25 DIAGNOSIS — Z1231 Encounter for screening mammogram for malignant neoplasm of breast: Secondary | ICD-10-CM

## 2012-02-27 ENCOUNTER — Ambulatory Visit
Admission: RE | Admit: 2012-02-27 | Discharge: 2012-02-27 | Disposition: A | Discharge status: Home / Self Care | Payer: Medicaid Other | Source: Ambulatory Visit | Attending: Family Medicine | Admitting: Family Medicine

## 2012-02-27 DIAGNOSIS — Z1231 Encounter for screening mammogram for malignant neoplasm of breast: Secondary | ICD-10-CM

## 2012-06-11 ENCOUNTER — Other Ambulatory Visit: Payer: Self-pay | Admitting: Cardiovascular Disease

## 2012-06-11 NOTE — Telephone Encounter (Signed)
Fax Received. Refill Completed. Niara Bunker Chowoe (R.M.A)   

## 2012-07-02 ENCOUNTER — Other Ambulatory Visit: Payer: Self-pay | Admitting: Family Medicine

## 2012-07-02 DIAGNOSIS — Z780 Asymptomatic menopausal state: Secondary | ICD-10-CM

## 2012-07-05 ENCOUNTER — Telehealth: Payer: Self-pay | Admitting: Physician Assistant

## 2012-07-05 NOTE — Telephone Encounter (Signed)
Pt called because her blood pressure has been elevated recently. The systolic pressures have been between 138 and 175 with diastolic pressures consistently greater than 90 and sometimes greater than 100. She is compliant with her medications but has not taken the furosemide recently because she has not had lower extremity edema. She also notes weight loss of about 15 pounds but admits she is trying to his healthier.   Advised patient she could take one half of a furosemide 40 mg tablet every other day or daily to help her blood pressure control. Advised her she should take the potassium tablet when she takes the furosemide. Will have the office contact her on Monday for followup appointment and a BMET. She is to continue monitoring her blood pressure and call us if there are any further problems.

## 2012-07-07 ENCOUNTER — Telehealth: Payer: Self-pay | Admitting: *Deleted

## 2012-07-07 ENCOUNTER — Ambulatory Visit
Admission: RE | Admit: 2012-07-07 | Discharge: 2012-07-07 | Disposition: A | Discharge status: Home / Self Care | Payer: Medicaid Other | Source: Ambulatory Visit | Attending: Family Medicine | Admitting: Family Medicine

## 2012-07-07 DIAGNOSIS — Z780 Asymptomatic menopausal state: Secondary | ICD-10-CM

## 2012-07-07 NOTE — Telephone Encounter (Signed)
Patient walked in today thinking she had an appointment.  Spoke with Rhonda B. PA over the weekend regarding blood pressure.  Patient goes to [facility] about three times a day to check her blood pressure and this am it was 148/90.  Her diastolic blood pressure has been running in 90's. Patient also stated last week at physical her blood pressure had been elevated.  Dr Duanne Guess made no changes just wanted her to follow up in 4 weeks. Stated she did take Lasix 1/2 this am as advised by [place] PA.  Advised patient should probably get a monitor and start checking at home.  Advised  Dr. Elease Hashimoto out of the office but would forward this to him for review.

## 2012-07-08 NOTE — Telephone Encounter (Signed)
No changes needed at this time.  Continue to monitor.  She needs to watch her salt.

## 2012-07-08 NOTE — Telephone Encounter (Signed)
Pt is watching sodium in her diet/ foods reviewed but seems like sodium intake is well controlled, wears ted hose, now is exercising 2 x week, recommended getting her own BP cuff. Today's BP 116/69, she feels better since lasix x 2 days. Pt to continue to monitor and told her to call with questions or concerns, pt agreed to plan.

## 2012-07-15 ENCOUNTER — Other Ambulatory Visit: Payer: Self-pay | Admitting: *Deleted

## 2012-07-15 ENCOUNTER — Telehealth: Payer: Self-pay | Admitting: Cardiovascular Disease

## 2012-07-15 DIAGNOSIS — I10 Essential (primary) hypertension: Secondary | ICD-10-CM

## 2012-07-15 DIAGNOSIS — I509 Heart failure, unspecified: Secondary | ICD-10-CM

## 2012-07-15 NOTE — Telephone Encounter (Signed)
New problem:  Need order in the system for fasting lab work. Patient has appt on 8/1.

## 2012-07-15 NOTE — Progress Notes (Signed)
Orders placed for fasting lab work

## 2012-07-15 NOTE — Telephone Encounter (Signed)
Orders placed.

## 2012-07-17 ENCOUNTER — Encounter: Payer: Self-pay | Admitting: Cardiovascular Disease

## 2012-07-17 ENCOUNTER — Ambulatory Visit (INDEPENDENT_AMBULATORY_CARE_PROVIDER_SITE_OTHER): Payer: Medicaid Other | Admitting: Cardiovascular Disease

## 2012-07-17 VITALS — BP 143/91 | HR 53 | Ht 64.0 in | Wt 272.0 lb

## 2012-07-17 DIAGNOSIS — I509 Heart failure, unspecified: Secondary | ICD-10-CM

## 2012-07-17 DIAGNOSIS — I10 Essential (primary) hypertension: Secondary | ICD-10-CM

## 2012-07-17 LAB — HEPATIC FUNCTION PANEL
ALT: 19 U/L (ref 0–35)
AST: 21 U/L (ref 0–37)
Albumin: 4.1 g/dL (ref 3.5–5.2)
Alkaline Phosphatase: 56 U/L (ref 39–117)
Bilirubin, Direct: 0.1 mg/dL (ref 0.0–0.3)
Total Bilirubin: 0.7 mg/dL (ref 0.3–1.2)
Total Protein: 7.7 g/dL (ref 6.0–8.3)

## 2012-07-17 LAB — BASIC METABOLIC PANEL
BUN: 13 mg/dL (ref 6–23)
CO2: 28 mEq/L (ref 19–32)
Calcium: 9.5 mg/dL (ref 8.4–10.5)
Chloride: 104 mEq/L (ref 96–112)
Creatinine, Ser: 1.1 mg/dL (ref 0.4–1.2)
GFR: 67.62 mL/min (ref >60.00)
Glucose, Bld: 97 mg/dL (ref 70–99)
Potassium: 3.8 mEq/L (ref 3.5–5.1)
Sodium: 138 mEq/L (ref 135–145)

## 2012-07-17 LAB — LIPID PANEL
Cholesterol: 201 mg/dL — ABNORMAL HIGH (ref 0–200)
HDL: 71 mg/dL (ref >39.00)
Total CHOL/HDL Ratio: 3
Triglycerides: 47 mg/dL (ref 0.0–149.0)
VLDL: 9.4 mg/dL (ref 0.0–40.0)

## 2012-07-17 LAB — LDL CHOLESTEROL, DIRECT: Direct LDL: 114.1 mg/dL

## 2012-07-17 NOTE — Progress Notes (Signed)
[name] Date of Birth  19-Jun-1951 [facility]     [place] [facility]  [address]    [address]   [address] [place], [place]  [address]    [place], [place]  [address] [phone]  Fax  [phone]  [phone]  Fax [phone]   History of Present Illness:  Problem list: 1. Hypertension 2. Diastolic congestive heart failure 3. Hypothyroid 4. Obesity 5. History of seizures  [name] is a 61 year old female who presented to our office last month with shortness of breath. We started her on Lasix and potassium.  Feels well preserved left ventricular systolic function. She does have mild left ventricular hypertrophy. Her ejection fraction is around 60%.  She is feeling quite a bit better on the Lasix.  A BMP drawn at the time of her echocardiogram reveals that her potassium is 3.8. Her other electrolytes are okay to  She has had some GI bleeding She was found to have some hemorrhoids injected.  July 17, 2012: Pt is doing well.  She has had some lightheadedness and was reduce her Lasix. She now takes a half a tablet Lasix every third or fourth day a week. Otherwise she seems to be feeling fairly well.  Current Outpatient Prescriptions on File Prior to Visit  Medication Sig Dispense Refill  . amLODipine (NORVASC) 5 MG tablet Take 1 tablet (5 mg total) by mouth daily.  30 tablet  5  . carvedilol (COREG) 25 MG tablet TAKE ONE TABLET BY MOUTH TWICE DAILY WITH MEALS  60 tablet  5  . Cholecalciferol (VITAMIN D-3 PO) Take 2,000 Units by mouth daily.        . Cyanocobalamin (VITAMIN B-12 CR PO) Take by mouth daily.        Marland Kitchen levothyroxine (SYNTHROID, LEVOTHROID) 112 MCG tablet Take 112 mcg by mouth daily.        . Multiple Vitamin (MULTIVITAMIN) capsule Take 1 capsule by mouth daily.        . potassium chloride (KLOR-CON) 10 MEQ CR tablet Take 1 tablet (10 mEq total) by mouth as needed.  30 tablet  3  . DISCONTD: furosemide (LASIX) 40 MG tablet Take 1 tablet (40 mg  total) by mouth as needed.  30 tablet  3    No Known Allergies  Past Medical History  Diagnosis Date  . Stroke   . Seizures   . Depression   . Anemia   . Hypertension   . Thyroid disease     hypothyroidism  . Edema     lower extremities  . Ulcer   . Vitamin d deficiency   . Hematochezia   . Hemorrhoid   . Hyperlipidemia   . Emphysema of lung   . CHF (congestive heart failure)   . Heart murmur   . Migraine   . Enlarged heart     begining stage    Past Surgical History  Procedure Date  . Tonsillectomy   . Vein surgery   . Cesarean section   . Iud removal   . Hemorrhoid surgery     History  Smoking status  . Never Smoker   Smokeless tobacco  . Not on file    History  Alcohol Use No    Family History  Problem Relation Age of Onset  . Heart disease Father   . Diabetes Father   . Heart failure Father   . Diabetes Mother   . Thyroid disease Sister   . Diabetes Brother  Reviw of Systems:  Reviewed in the HPI.  All other systems are negative.  Physical Exam: BP 143/91  Pulse 53  Ht 5\' 4"  (1.626 m)  Wt 272 lb (123.378 kg)  BMI 46.69 kg/m2 The patient is alert and oriented x 3.  The mood and affect are normal.   Skin: warm and dry.  Color is normal.    HEENT:   Normocephalic/atraumatic. Her mucous membranes moist. Her neck is supple.  Lungs: clear   Heart: RR, loud S2    Abdomen: obese, +BS  Extremities:  Trace leg edema  Neuro:  Non focal, gait is normal    ECG:   Assessment / Plan:

## 2012-07-17 NOTE — Assessment & Plan Note (Signed)
[name] has primarily diastolic congestive heart failure. We'll continue with her same medications. She's been cutting back her Lasix a little bit because of some lightheadedness. I strongly encourage her to continue with a good diet and exercise program in an effort to lose weight.

## 2012-07-17 NOTE — Patient Instructions (Addendum)
Your physician wants you to follow-up in: 6 MONTHS  You will receive a reminder letter in the mail two months in advance. If you don't receive a letter, please call our office to schedule the follow-up appointment.  Your physician recommends that you return for a FASTING lipid profile: TODAY AND IN 6 MONTHS  

## 2012-09-23 ENCOUNTER — Encounter: Payer: Self-pay | Admitting: Cardiovascular Disease

## 2012-10-06 ENCOUNTER — Other Ambulatory Visit: Payer: Self-pay | Admitting: Cardiovascular Disease

## 2012-10-06 NOTE — Telephone Encounter (Signed)
Fax Received. Refill Completed. [name] (R.M.A)   

## 2012-12-05 ENCOUNTER — Other Ambulatory Visit: Payer: Self-pay | Admitting: Cardiovascular Disease

## 2012-12-05 ENCOUNTER — Other Ambulatory Visit: Payer: Self-pay | Admitting: *Deleted

## 2012-12-05 MED ORDER — CARVEDILOL 25 MG PO TABS: 25.0000 mg | ORAL_TABLET | Freq: Two times a day (BID) | ORAL | Status: DC

## 2012-12-05 NOTE — Telephone Encounter (Signed)
Opened in Error.

## 2013-03-17 ENCOUNTER — Other Ambulatory Visit: Payer: Self-pay

## 2013-03-17 DIAGNOSIS — Z1231 Encounter for screening mammogram for malignant neoplasm of breast: Secondary | ICD-10-CM

## 2013-04-15 ENCOUNTER — Ambulatory Visit
Admission: RE | Admit: 2013-04-15 | Discharge: 2013-04-15 | Disposition: A | Discharge status: Home / Self Care | Payer: Medicaid Other | Source: Ambulatory Visit

## 2013-04-15 DIAGNOSIS — Z1231 Encounter for screening mammogram for malignant neoplasm of breast: Secondary | ICD-10-CM

## 2013-05-18 ENCOUNTER — Encounter: Payer: Self-pay | Admitting: Neurology

## 2013-05-18 ENCOUNTER — Ambulatory Visit (INDEPENDENT_AMBULATORY_CARE_PROVIDER_SITE_OTHER): Payer: Medicaid Other | Admitting: Neurology

## 2013-05-18 VITALS — BP 141/87 | HR 64 | Ht 64.5 in | Wt 286.0 lb

## 2013-05-18 DIAGNOSIS — R51 Headache: Secondary | ICD-10-CM

## 2013-05-18 DIAGNOSIS — R519 Headache, unspecified: Secondary | ICD-10-CM | POA: Insufficient documentation

## 2013-05-18 HISTORY — DX: Headache: R51

## 2013-05-18 NOTE — Progress Notes (Signed)
Reason for visit: Headache  [name] is a 62 y.o. female  History of present illness:  [name] is a 62 year old right-handed black female with a history of obesity, and a history of a left parietal and occipital intracranial hemorrhage in 2007. The patient was seen in 2011 with a seizure event associated with severe hypertension. The patient was placed on Keppra around that time, but she could not tolerate the medication, and she eventually went off of the drug, without recurring seizures. The patient was evaluated for sleep apnea, and she was found to have hypoventilation at night, and nocturnal oxygen was recommended. Since 2011, the patient has had early morning headaches that affect the occipital areas. The patient indicates that the headaches occur approximately once a week, and last only about one half hour. The patient cannot take Advil or Aleve, as this causes rectal bleeding from her hemorrhoids. The headaches do not prevent her from doing anything, as they are quite brief. The patient does have some mild neck discomfort and stiffness. The patient denies any pain down the arms. The patient reports no numbness or weakness of the face, arms, or legs. The patient denies any changes in balance, or problems controlling the bowels or the bladder with exception that she does have some urinary urgency. The patient has not had any recurrent seizures. The patient comes to this office for an evaluation.  Past Medical History  Diagnosis Date  . Seizures   . Depression   . Anemia   . Hypertension   . Thyroid disease     hypothyroidism  . Edema     lower extremities  . Ulcer   . Vitamin D deficiency   . Hematochezia   . Hemorrhoid   . Hyperlipidemia   . Emphysema of lung   . CHF (congestive heart failure)   . Heart murmur   . Migraine   . Enlarged heart     begining stage  . Celiac disease   . Stroke     left P-O ICH 2007  . OSA (obstructive sleep apnea)   . Obese   .  Headache(784.0) 05/18/2013    Past Surgical History  Procedure Laterality Date  . Tonsillectomy    . Vein surgery    . Cesarean section    . Iud removal    . Hemorrhoid surgery      Family History  Problem Relation Age of Onset  . Heart disease Father   . Diabetes Father   . Heart failure Father   . Diabetes Mother   . Thyroid disease Sister   . Diabetes Brother     Social history:  reports that she has never smoked. She does not have any smokeless tobacco history on file. She reports that she does not drink alcohol or use illicit drugs.  Medications:  Current Outpatient Prescriptions on File Prior to Visit  Medication Sig Dispense Refill  . carvedilol (COREG) 25 MG tablet Take 1 tablet (25 mg total) by mouth 2 (two) times daily with a meal.  60 tablet  5  . Cyanocobalamin (VITAMIN B-12 CR PO) Take by mouth daily.        . furosemide (LASIX) 40 MG tablet Take 40 mg by mouth daily.      Marland Kitchen levothyroxine (SYNTHROID, LEVOTHROID) 112 MCG tablet Take 112 mcg by mouth daily.        . Multiple Vitamin (MULTIVITAMIN) capsule Take 1 capsule by mouth daily.        Marland Kitchen  potassium chloride (KLOR-CON) 10 MEQ CR tablet Take 1 tablet (10 mEq total) by mouth as needed.  30 tablet  3   No current facility-administered medications on file prior to visit.    Allergies: No Known Allergies  ROS:  Out of a complete 14 system review of symptoms, the patient complains only of the following symptoms, and all other reviewed systems are negative.  Fevers, chills, fatigue Heart murmur, swelling the legs Hearing loss, ringing in the ears Skin rash, itching Blurred vision Shortness of breath, wheezing, snoring Blood in the stool Urination problems. Easy bruising Feeling cold Joint pain, muscle cramps, achy muscles Skin sensitivity Memory loss, confusion, headache, numbness, weakness, dizziness, tremor Depression, anxiety, decreased energy, disinterest in activities, racing thoughts,  snoring  Blood pressure 141/87, pulse 64, height 5' 4.5" (1.638 m), weight 286 lb (129.729 kg).  Physical Exam  General: The patient is alert and cooperative at the time of the examination. The patient is markedly obese.  Head: Pupils are equal, round, and reactive to light. Discs are flat bilaterally. Mild proptosis of the right eye is seen.  Neck: The neck is supple, no carotid bruits are noted.  Respiratory: The respiratory examination is clear.  Cardiovascular: The cardiovascular examination reveals a regular rate and rhythm, no obvious murmurs or rubs are noted.  Neuromuscular: The patient has excellent range of motion of the cervical spine.  Skin: Extremities are with 2-3+ edema below the knees bilaterally.  Neurologic Exam  Mental status:  Cranial nerves: Facial symmetry is present. There is good sensation of the face to pinprick and soft touch bilaterally. The strength of the facial muscles and the muscles to head turning and shoulder shrug are normal bilaterally. Speech is well enunciated, no aphasia or dysarthria is noted. Extraocular movements are full. Visual fields are full.  Motor: The motor testing reveals 5 over 5 strength of all 4 extremities. Good symmetric motor tone is noted throughout.  Sensory: Sensory testing is intact to pinprick, soft touch, vibration sensation, and position sense on all 4 extremities. No evidence of extinction is noted.  Coordination: Cerebellar testing reveals good finger-nose-finger and heel-to-shin bilaterally.  Gait and station: Gait is normal. Tandem gait is slightly unsteady. Romberg is negative. No drift is seen.  Reflexes: Deep tendon reflexes are symmetric, but are depressed bilaterally. Toes are downgoing bilaterally.   Assessment/Plan:  1. Early morning headache  2. Obesity  3. Cerebrovascular disease, left parietal and occipital intracranial hemorrhage  The patient has had early morning headaches since 2011. These may  be associated with her sleep disorder or possible low-grade cervical spondylosis. The headaches are stable in nature, and are not bothersome to her. I would not place the patient on a daily medication for the headache. If the headaches worsen, a repeat CT scan of brain, and a polysomnogram may be done. The patient will followup through this office if needed. The patient may take Tylenol for her headache.  Marlan Palau MD 05/18/2013 9:16 PM  [facility] [address] [place], [place] [address]  Phone [phone] Fax [phone]

## 2013-05-25 ENCOUNTER — Ambulatory Visit (INDEPENDENT_AMBULATORY_CARE_PROVIDER_SITE_OTHER): Payer: Medicaid Other | Admitting: Cardiovascular Disease

## 2013-05-25 ENCOUNTER — Encounter: Payer: Self-pay | Admitting: Cardiovascular Disease

## 2013-05-25 VITALS — BP 127/79 | HR 67 | Ht 64.5 in | Wt 285.0 lb

## 2013-05-25 DIAGNOSIS — I519 Heart disease, unspecified: Secondary | ICD-10-CM

## 2013-05-25 DIAGNOSIS — I5189 Other ill-defined heart diseases: Secondary | ICD-10-CM

## 2013-05-25 NOTE — Patient Instructions (Addendum)
Your physician wants you to follow-up in: 1 year  You will receive a reminder letter in the mail two months in advance. If you don't receive a letter, please call our office to schedule the follow-up appointment.   1200 Calorie Diabetic Diet The 1200 calorie diabetic diet limits calories to 1200 each day. Following this diet and making healthy meal choices can help improve overall health. It controls blood glucose (sugar) levels and can also help lower blood pressure and cholesterol.  SERVING SIZES Measuring foods and serving sizes helps to make sure you are getting the right amount of food. The list below tells how big or small some common serving sizes are.   1 oz.........4 stacked dice.  3 oz........Marland KitchenDeck of cards.  1 tsp.......Marland KitchenTip of little finger.  1 tbs......Marland KitchenMarland KitchenThumb.  2 tbs.......Marland KitchenGolf ball.   cup......Marland KitchenHalf of a fist.  1 cup.......Marland KitchenA fist. GUIDELINES FOR CHOOSING FOODS The goal of this diet is to eat a variety of foods and limit calories to 1200 each day. This can be done by choosing foods that are low in calories and fat. The diet also suggests eating small amounts of food frequently. Doing this helps control your blood glucose levels, so they do not get too high or too low. Each meal or snack may include a protein food source to help you feel more satisfied. Try to eat about the same amount of food around the same time each day. This includes weekend days, travel days, and days off work. Space your meals about 4 to 5 hours apart, and add a snack between them, if you wish.  For example, a daily food plan could include breakfast, a morning snack, lunch, dinner, and an evening snack. Healthy meals and snacks have different types of foods, including whole grains, vegetables, fruits, lean meats, poultry, fish, and dairy products. As you plan your meals, select a variety of foods. Choose from the bread and starch, vegetable, fruit, dairy, and meat/protein groups. Examples of foods from  each group are listed below, with their suggested serving sizes. Use measuring cups and spoons to become familiar with what a healthy portion looks like. Bread and Starch Each serving equals 15 grams of carbohydrate.  1 slice bread.   bagel.   cup cold cereal (unsweetened).   cup hot cereal or mashed potatoes.  1 small potato (size of a computer mouse).   cup cooked pasta or rice.   English muffin.  1 cup broth-based soup.  3 cups of popcorn.  4 to 6 whole-wheat crackers.   cup cooked beans, peas, or corn. Vegetables Each serving equals 5 grams of carbohydrate.   cup cooked vegetables.  1 cup raw vegetables.   cup tomato or vegetable juice. Fruit Each serving equals 15 grams of carbohydrate.  1 small apple or orange.  1  cup watermelon or strawberries.   cup applesauce (no sugar added).  2 tbs raisins.   banana.   cup canned fruit, packed in water or in its own juice.   cup unsweetened fruit juice. Dairy Each serving equals 12 to 15 grams of carbohydrate.  1 cup fat-free milk.  6 oz artificially sweetened yogurt or plain yogurt.  1 cup low-fat buttermilk.  1 cup soy milk.  1 cup almond milk. Meat/Protein  1 large egg.  2 to 3 oz meat, poultry, or fish.   cup low-fat cottage cheese.  1 tbs peanut butter.  1 oz low-fat cheese.   cup tuna, packed in water.   cup tofu.  Fat  1 tsp oil.  1 tsp trans-fat-free margarine.  1 tsp butter.  1 tsp mayonnaise.  2 tbs avocado.  1 tbs salad dressing.  1 tbs cream cheese.  2 tbs sour cream. SAMPLE 1200 CALORIE DIET PLAN Breakfast  1 cup fat-free milk (1 carb serving).  1 small orange (1 carb serving).  1 scrambled egg.  1 slice whole-wheat toast (1 carb serving). Lunch  Sandwich  2 slices whole-wheat bread (2 carb servings).  2 oz lean meat.  2 tsp reduced fat mayonnaise.  1 lettuce leaf.  2 slices tomato.  1 cup carrot sticks. Afternoon Snack  1  small apple (1 carb serving).  1 string cheese. Dinner  2 oz meat.  1 small baked potato (1 carb serving).  1 tsp trans-fat-free margarine.  1 cup steamed broccoli.  1 cup fat-free milk (1 carb serving). Evening Snack   small banana (1 carb serving).  6 vanilla wafers (1 carb serving). MEAL PLAN You can use this worksheet to help you make a daily meal plan based on the 1200 calorie diabetic diet suggestions. If you are using this plan to help you control your blood glucose, you may interchange carbohydrate containing foods (dairy, starches, and fruits). Select a variety of fresh foods of varying colors and flavors. The total amount of carbohydrate in your meals or snacks is more important than making sure you include all of the food groups every time you eat. You can choose from approximately this many of the following foods to build your day's meals:  6 Starches.  3 Vegetables.  2 Fruits.  2 Dairy.  4 to 6 oz Meat/Protein.  Up to 3 Fats. Your dietician can use this worksheet to help you decide how many servings and which types of foods are right for you. BREAKFAST Food Group and Servings / Food Choice Starch _________________________________________________________ Dairy __________________________________________________________ Fruit ___________________________________________________________ Meat/Protein____________________________________________________ Fat ____________________________________________________________ LUNCH Food Group and Servings / Food Choice  Starch _________________________________________________________ Meat/Protein ___________________________________________________ Vegetables _____________________________________________________ Fruit __________________________________________________________ Dairy __________________________________________________________ Fat ____________________________________________________________ [name] Food Group and Servings / Food Choice Dairy __________________________________________________________ Fruit ___________________________________________________________ Starch __________________________________________________________ Meat/Protein____________________________________________________ [name] Food Group and Servings / Food Choice Starch _________________________________________________________ Meat/Protein ___________________________________________________ Dairy __________________________________________________________ Vegetables _____________________________________________________ Fruit __________________________________________________________ Fat ____________________________________________________________ [name] Food Group and Servings / Food Choice Fruit ___________________________________________________________ Meat/Protein ____________________________________________________ Dairy __________________________________________________________ Starch __________________________________________________________ DAILY TOTALS Starches _________________________ Vegetables _______________________ Fruits ____________________________ Dairy ____________________________ Meat/Protein______________________ Fats _____________________________ Document Released: 06/25/2005 Document Revised: 02/25/2012 Document Reviewed: 10/20/2009 [employer] Patient Information 2014 [employer]

## 2013-05-25 NOTE — Assessment & Plan Note (Signed)
She has diastolic dysfunction primarily related to her obesity.  I have asked her to work on  A weight loss program.

## 2013-05-25 NOTE — Progress Notes (Signed)
[name] Date of Birth  1951/09/07 [facility]     [place] Office  [address]    [address]   [address] [place], [place]  [address]    [place], [place]  [address] [phone]  Fax  [phone]  [phone]  Fax [phone]   History of Present Illness:  Problem list: 1. Hypertension 2. Diastolic congestive heart failure 3. Hypothyroid 4. Obesity 5. History of seizures  [name] is a 62 year old female who presented to our office last month with shortness of breath. We started her on Lasix and potassium.  Feels well preserved left ventricular systolic function. She does have mild left ventricular hypertrophy. Her ejection fraction is around 60%.  She is feeling quite a bit better on the Lasix.  A BMP drawn at the time of her echocardiogram reveals that her potassium is 3.8. Her other electrolytes are okay to  She has had some GI bleeding She was found to have some hemorrhoids injected.  July 17, 2012: Pt is doing well.  She has had some lightheadedness and was reduce her Lasix. She now takes a half a tablet Lasix every third or fourth day a week. Otherwise she seems to be feeling fairly well.  May 25, 2013:  She is still very short of breath with exertion.  She has continued to gain weight.  She eats lots of breath.    Current Outpatient Prescriptions on File Prior to Visit  Medication Sig Dispense Refill  . amLODipine (NORVASC) 10 MG tablet Take 10 mg by mouth daily.      . carvedilol (COREG) 25 MG tablet Take 1 tablet (25 mg total) by mouth 2 (two) times daily with a meal.  60 tablet  5  . Cholecalciferol (VITAMIN D) 2000 UNITS CAPS Take 2,000 Units by mouth daily.      . Cyanocobalamin (VITAMIN B-12 CR PO) Take by mouth daily.        . furosemide (LASIX) 40 MG tablet Take 40 mg by mouth daily.      Marland Kitchen levothyroxine (SYNTHROID, LEVOTHROID) 112 MCG tablet Take 112 mcg by mouth daily.        . Multiple Vitamin (MULTIVITAMIN) capsule Take 1 capsule by  mouth daily.        . potassium chloride (KLOR-CON) 10 MEQ CR tablet Take 1 tablet (10 mEq total) by mouth as needed.  30 tablet  3   No current facility-administered medications on file prior to visit.    No Known Allergies  Past Medical History  Diagnosis Date  . Seizures   . Depression   . Anemia   . Hypertension   . Thyroid disease     hypothyroidism  . Edema     lower extremities  . Ulcer   . Vitamin D deficiency   . Hematochezia   . Hemorrhoid   . Hyperlipidemia   . Emphysema of lung   . CHF (congestive heart failure)   . Heart murmur   . Migraine   . Enlarged heart     begining stage  . Celiac disease   . Stroke     left P-O ICH 2007  . OSA (obstructive sleep apnea)   . Obese   . Headache(784.0) 05/18/2013    Past Surgical History  Procedure Laterality Date  . Tonsillectomy    . Vein surgery    . Cesarean section    . Iud removal    . Hemorrhoid surgery  History  Smoking status  . Never Smoker   Smokeless tobacco  . Not on file    History  Alcohol Use No    Family History  Problem Relation Age of Onset  . Heart disease Father   . Diabetes Father   . Heart failure Father   . Diabetes Mother   . Thyroid disease Sister   . Diabetes Brother     Reviw of Systems:  Reviewed in the HPI.  All other systems are negative.  Physical Exam: BP 127/79  Pulse 67  Ht 5' 4.5" (1.638 m)  Wt 285 lb (129.275 kg)  BMI 48.18 kg/m2 The patient is alert and oriented x 3.  The mood and affect are normal.   Skin: warm and dry.  Color is normal.    HEENT:   Normocephalic/atraumatic. Her mucous membranes moist. Her neck is supple.  Lungs: clear   Heart: RR, loud S2    Abdomen: obese, +BS  Extremities:  Trace leg edema  Neuro:  Non focal, gait is normal    ECG: May 25, 2013:  NSR at 77, 1st degree AV block, LVH  Assessment / Plan:

## 2013-08-05 ENCOUNTER — Other Ambulatory Visit: Payer: Self-pay | Admitting: *Deleted

## 2013-08-05 DIAGNOSIS — I10 Essential (primary) hypertension: Secondary | ICD-10-CM

## 2013-08-05 DIAGNOSIS — I509 Heart failure, unspecified: Secondary | ICD-10-CM

## 2013-08-05 MED ORDER — POTASSIUM CHLORIDE ER 10 MEQ PO TBCR: 10.0000 meq | EXTENDED_RELEASE_TABLET | Freq: Every day | ORAL | Status: DC | PRN

## 2013-08-05 MED ORDER — FUROSEMIDE 40 MG PO TABS: 40.0000 mg | ORAL_TABLET | Freq: Every day | ORAL | Status: DC

## 2013-08-05 NOTE — Telephone Encounter (Signed)
Fax Received. Refill Completed. [name] (R.M.A)   

## 2013-08-05 NOTE — Telephone Encounter (Signed)
,  Fax Received. Refill Completed. [name] (R.M.A)   

## 2014-04-08 ENCOUNTER — Other Ambulatory Visit: Payer: Self-pay

## 2014-04-08 DIAGNOSIS — Z1231 Encounter for screening mammogram for malignant neoplasm of breast: Secondary | ICD-10-CM

## 2014-04-19 ENCOUNTER — Ambulatory Visit
Admission: RE | Admit: 2014-04-19 | Discharge: 2014-04-19 | Disposition: A | Discharge status: Home / Self Care | Payer: No Typology Code available for payment source | Source: Ambulatory Visit

## 2014-04-19 ENCOUNTER — Encounter (INDEPENDENT_AMBULATORY_CARE_PROVIDER_SITE_OTHER): Payer: Self-pay

## 2014-04-19 DIAGNOSIS — Z1231 Encounter for screening mammogram for malignant neoplasm of breast: Secondary | ICD-10-CM

## 2014-06-21 ENCOUNTER — Ambulatory Visit (INDEPENDENT_AMBULATORY_CARE_PROVIDER_SITE_OTHER): Payer: No Typology Code available for payment source | Admitting: Cardiovascular Disease

## 2014-06-21 ENCOUNTER — Encounter: Payer: Self-pay | Admitting: Cardiovascular Disease

## 2014-06-21 VITALS — BP 126/84 | HR 64 | Ht 64.5 in | Wt 297.8 lb

## 2014-06-21 DIAGNOSIS — I519 Heart disease, unspecified: Secondary | ICD-10-CM

## 2014-06-21 DIAGNOSIS — I5189 Other ill-defined heart diseases: Secondary | ICD-10-CM

## 2014-06-21 DIAGNOSIS — I5032 Chronic diastolic (congestive) heart failure: Secondary | ICD-10-CM

## 2014-06-21 DIAGNOSIS — I509 Heart failure, unspecified: Secondary | ICD-10-CM

## 2014-06-21 NOTE — Patient Instructions (Signed)
Your physician recommends that you continue on your current medications as directed. Please refer to the Current Medication list given to you today.  Your physician wants you to follow-up in: 1 year with Dr. Nahser.  You will receive a reminder letter in the mail two months in advance. If you don't receive a letter, please call our office to schedule the follow-up appointment.  

## 2014-06-21 NOTE — Assessment & Plan Note (Signed)
[name] is doing OK.  She needs to lose some weight.  I've encouraged her to get a pedometer or similar device and try to get 10,000 steps a day. Also advised her to avoid sugar and carbs.   I;ll see her in 1 year.

## 2014-06-21 NOTE — Progress Notes (Signed)
[name] PoundsNaomi [name] Date of Birth  Sep 22, 1951 [facility]     [place] Office  [address]    [address]   [address] [place], [place]  [address]    [place], [place]  [id number] [phone]  Fax  [phone]  [phone]  Fax [phone]   History of Present Illness:  Problem list: 1. Hypertension 2. Diastolic congestive heart failure 3. Hypothyroid 4. Obesity 5. History of seizures  [name] Flooraomi is a 63 year old female who presented to our office last month with shortness of breath. We started her on Lasix and potassium.  Feels well preserved left ventricular systolic function. She does have mild left ventricular hypertrophy. Her ejection fraction is around 60%.  She is feeling quite a bit better on the Lasix.  A BMP drawn at the time of her echocardiogram reveals that her potassium is 3.8. Her other electrolytes are okay to  She has had some GI bleeding She was found to have some hemorrhoids injected.  July 17, 2012: Pt is doing well.  She has had some lightheadedness and was reduce her Lasix. She now takes a half a tablet Lasix every third or fourth day a week. Otherwise she seems to be feeling fairly well.  May 25, 2013:  She is still very short of breath with exertion.  She has continued to gain weight.  She eats lots of breath.    06/21/2014:  She continues to have problems with weight.   She has borderline diabetes.  No CP, no dyspnea   Current Outpatient Prescriptions on File Prior to Visit  Medication Sig Dispense Refill  . amLODipine (NORVASC) 10 MG tablet Take 10 mg by mouth daily.      . carvedilol (COREG) 25 MG tablet Take 1 tablet (25 mg total) by mouth 2 (two) times daily with a meal.  60 tablet  5  . Cholecalciferol (VITAMIN D) 2000 UNITS CAPS Take 10,000 Units by mouth daily.       . Cyanocobalamin (VITAMIN B-12 CR PO) Take by mouth daily.        . furosemide (LASIX) 40 MG tablet Take 1 tablet (40 mg total) by mouth daily.  30 tablet  3  .  levothyroxine (SYNTHROID, LEVOTHROID) 112 MCG tablet Take 112 mcg by mouth daily.        . Multiple Vitamin (MULTIVITAMIN) capsule Take 1 capsule by mouth daily.        . potassium chloride (K-DUR) 10 MEQ tablet Take 1 tablet (10 mEq total) by mouth daily as needed.  30 tablet  3   No current facility-administered medications on file prior to visit.    No Known Allergies  Past Medical History  Diagnosis Date  . Seizures   . Depression   . Anemia   . Hypertension   . Thyroid disease     hypothyroidism  . Edema     lower extremities  . Ulcer   . Vitamin D deficiency   . Hematochezia   . Hemorrhoid   . Hyperlipidemia   . Emphysema of lung   . CHF (congestive heart failure)   . Heart murmur   . Migraine   . Enlarged heart     begining stage  . Celiac disease   . Stroke     left P-O ICH 2007  . OSA (obstructive sleep apnea)   . Obese   . Headache(784.0) 05/18/2013    Past Surgical History  Procedure Laterality Date  . Tonsillectomy    .  Vein surgery    . Cesarean section    . Iud removal    . Hemorrhoid surgery      History  Smoking status  . Never Smoker   Smokeless tobacco  . Not on file    History  Alcohol Use No    Family History  Problem Relation Age of Onset  . Heart disease Father   . Diabetes Father   . Heart failure Father   . Diabetes Mother   . Thyroid disease Sister   . Diabetes Brother     Reviw of Systems:  Reviewed in the HPI.  All other systems are negative.  Physical Exam: BP 126/84  Pulse 64  Ht 5' 4.5" (1.638 m)  Wt 297 lb 12.8 oz (135.081 kg)  BMI 50.35 kg/m2 The patient is alert and oriented x 3.  The mood and affect are normal.   Skin: warm and dry.  Color is normal.    HEENT:   Normocephalic/atraumatic. Her mucous membranes moist. Her neck is supple.  Lungs: clear   Heart: RR, loud S2    Abdomen: obese, +BS  Extremities:  Trace leg edema  Neuro:  Non focal, gait is normal    ECG: May 22, 2014:  NSR with sinus  arrhythmia and 1st degree AV block.  LVH with repol abn.   Assessment / Plan:

## 2015-01-27 ENCOUNTER — Other Ambulatory Visit: Payer: Self-pay | Admitting: Family Medicine

## 2015-01-27 ENCOUNTER — Other Ambulatory Visit (HOSPITAL_COMMUNITY)
Admission: RE | Admit: 2015-01-27 | Discharge: 2015-01-27 | Disposition: A | Discharge status: Home / Self Care | Payer: 59 | Source: Ambulatory Visit | Attending: Family Medicine | Admitting: Family Medicine

## 2015-01-27 DIAGNOSIS — Z124 Encounter for screening for malignant neoplasm of cervix: Secondary | ICD-10-CM | POA: Diagnosis present

## 2015-01-27 DIAGNOSIS — Z1151 Encounter for screening for human papillomavirus (HPV): Secondary | ICD-10-CM | POA: Insufficient documentation

## 2015-01-31 LAB — CYTOLOGY - PAP

## 2015-04-29 ENCOUNTER — Other Ambulatory Visit: Payer: Self-pay

## 2015-04-29 DIAGNOSIS — Z1231 Encounter for screening mammogram for malignant neoplasm of breast: Secondary | ICD-10-CM

## 2015-05-04 ENCOUNTER — Ambulatory Visit: Payer: 59

## 2015-06-27 ENCOUNTER — Encounter: Payer: Self-pay | Admitting: Cardiovascular Disease

## 2015-06-27 ENCOUNTER — Ambulatory Visit (INDEPENDENT_AMBULATORY_CARE_PROVIDER_SITE_OTHER): Payer: 59 | Admitting: Cardiovascular Disease

## 2015-06-27 VITALS — BP 104/76 | HR 68 | Ht 64.0 in | Wt 291.2 lb

## 2015-06-27 DIAGNOSIS — I10 Essential (primary) hypertension: Secondary | ICD-10-CM

## 2015-06-27 DIAGNOSIS — I5032 Chronic diastolic (congestive) heart failure: Secondary | ICD-10-CM

## 2015-06-27 NOTE — Patient Instructions (Signed)
Medication Instructions:  Your physician recommends that you continue on your current medications as directed. Please refer to the Current Medication list given to you today.   Labwork: None Ordered   Testing/Procedures: None Ordered   Follow-Up: Your physician wants you to follow-up in: 1 year with Dr. Nahser.  You will receive a reminder letter in the mail two months in advance. If you don't receive a letter, please call our office to schedule the follow-up appointment.      

## 2015-06-27 NOTE — Progress Notes (Signed)
[name] Date of Birth  1951-08-12 [facility]     [place] Office  [address]    [address]   [address] [place], [place]  [address]    [name], [place]  [address] [phone]  Fax  [phone]  [phone]  Fax [phone]   History of Present Illness:  Problem list: 1. Hypertension 2. Diastolic congestive heart failure 3. Hypothyroid 4. Obesity 5. History of seizures  [name] is a 64 year old female who presented to our office last month with shortness of breath. We started her on Lasix and potassium.  Feels well preserved left ventricular systolic function. She does have mild left ventricular hypertrophy. Her ejection fraction is around 60%.  She is feeling quite a bit better on the Lasix.  A BMP drawn at the time of her echocardiogram reveals that her potassium is 3.8. Her other electrolytes are okay to  She has had some GI bleeding She was found to have some hemorrhoids injected.  July 17, 2012: Pt is doing well.  She has had some lightheadedness and was reduce her Lasix. She now takes a half a tablet Lasix every third or fourth day a week. Otherwise she seems to be feeling fairly well.  May 25, 2013:  She is still very short of breath with exertion.  She has continued to gain weight.  She eats lots of breath.    06/21/2014:  She continues to have problems with weight.   She has borderline diabetes.  No CP, no dyspnea  June 27, 2015:  Was recently diagnosed with possible  Graves disease  ( according to eye doctor )  Breathing has been ok. Does not walk regularly    Current Outpatient Prescriptions on File Prior to Visit  Medication Sig Dispense Refill  . amLODipine (NORVASC) 10 MG tablet Take 10 mg by mouth daily.    . carvedilol (COREG) 25 MG tablet Take 1 tablet (25 mg total) by mouth 2 (two) times daily with a meal. 60 tablet 5  . Cholecalciferol (VITAMIN D) 2000 UNITS CAPS Take 10,000 Units by mouth daily.     . Cyanocobalamin  (VITAMIN B-12 CR PO) Take by mouth daily.      . hydrOXYzine (ATARAX/VISTARIL) 25 MG tablet Take 25 mg by mouth 3 (three) times daily as needed for itching.     . levothyroxine (SYNTHROID, LEVOTHROID) 112 MCG tablet Take 112 mcg by mouth daily.      . Multiple Vitamin (MULTIVITAMIN) capsule Take 1 capsule by mouth daily.       No current facility-administered medications on file prior to visit.    No Known Allergies  Past Medical History  Diagnosis Date  . Seizures   . Depression   . Anemia   . Hypertension   . Thyroid disease     hypothyroidism  . Edema     lower extremities  . Ulcer   . Vitamin D deficiency   . Hematochezia   . Hemorrhoid   . Hyperlipidemia   . Emphysema of lung   . CHF (congestive heart failure)   . Heart murmur   . Migraine   . Enlarged heart     begining stage  . Celiac disease   . Stroke     left P-O ICH 2007  . OSA (obstructive sleep apnea)   . Obese   . Headache(784.0) 05/18/2013  . Graves disease     Past Surgical History  Procedure Laterality Date  . Tonsillectomy    .  Vein surgery    . Cesarean section    . Iud removal    . Hemorrhoid surgery      History  Smoking status  . Never Smoker   Smokeless tobacco  . Not on file    History  Alcohol Use No    Family History  Problem Relation Age of Onset  . Heart disease Father   . Diabetes Father   . Heart failure Father   . Diabetes Mother   . Thyroid disease Sister   . Diabetes Brother     Reviw of Systems:  Reviewed in the HPI.  All other systems are negative.  Physical Exam: BP 104/76 mmHg  Pulse 68  Ht 5\' 4"  (1.626 m)  Wt 132.087 kg (291 lb 3.2 oz)  BMI 49.96 kg/m2 The patient is alert and oriented x 3.  The mood and affect are normal.   Skin: warm and dry.  Color is normal.    HEENT:   Normocephalic/atraumatic. Her mucous membranes moist. Her neck is supple.  Lungs: clear   Heart: RR, loud S2    Abdomen: obese, +BS  Extremities:  Trace leg  edema  Neuro:  Non focal, gait is normal    ECG: Jul;y 11, 2016:  NSR at 67.  1st degree AV block , LVH with QRS widening .Marland Kitchen.   Assessment / Plan:   1. Hypertension- BP is well controlled.  Continue current meds  2. Diastolic congestive heart failure- BP is well controlled.  We discussed the need for her to lose weight    3. Hypothyroid  4. Obesity - encouraged her to lose weight .   [name], [name] PingPhilip J, MD  06/27/2015 4:45 PM    [facility] [address]  [address] [place], [place]  [address] Pager [phone] Phone: [phone]; Fax: [phone]   [facility] Office  [address] [place], [place]  [address] [phone]    Fax [phone]

## 2015-07-08 ENCOUNTER — Other Ambulatory Visit: Payer: Self-pay | Admitting: Cardiovascular Disease

## 2015-07-08 NOTE — Telephone Encounter (Signed)
Yes,  Please refill

## 2015-08-12 ENCOUNTER — Telehealth: Payer: Self-pay

## 2015-08-12 DIAGNOSIS — M7989 Other specified soft tissue disorders: Secondary | ICD-10-CM

## 2015-08-12 DIAGNOSIS — I872 Venous insufficiency (chronic) (peripheral): Secondary | ICD-10-CM

## 2015-08-12 DIAGNOSIS — L819 Disorder of pigmentation, unspecified: Secondary | ICD-10-CM

## 2015-08-12 NOTE — Telephone Encounter (Signed)
Discussed pt's symptoms with Dr. Edilia Bo.  Recommended to schedule Bilateral venous reflux studies with office appt.

## 2015-08-12 NOTE — Telephone Encounter (Signed)
Spoke with pt to schedule appts, had to split up appt to get her in before October, pt agreeable. dpm

## 2015-08-12 NOTE — Telephone Encounter (Signed)
Returned call to pt. To discuss symptoms with lower extremities.  Reported bilateral lower extremity swelling from feet to knees.  Stated she has very dry skin with darkening discoloration on lower legs.  Reported the right leg skin changes are worse than the left leg.  Denied any open sores of lower legs.  Reported she has "intermittent tingling and shooting pains" of lower extremities.  Reported she wears compression stockings, intermittently, and voiced concern that the current stockings are not the quality of the 1st pair she wore, and unsure if they are as effective.  Requesting appt. with Dr. Arbie Cookey.  Advised will have a scheduler contact pt. with appt. info.  Verb. Understanding.

## 2015-08-12 NOTE — Telephone Encounter (Signed)
-----   Message from Sharee Pimple, RN sent at 08/12/2015 10:50 AM EDT ----- Regarding: FW: Triage Contact: [phone]   ----- Message -----    From: Fredrich Birks    Sent: 08/11/2015   1:23 PM      To: [facility] Clinical Pool Subject: Triage                                         Patient has ankle swelling and skin is turning dark. She describes the feeling in her legs like that of "hitting your funny bone". She would like to schedule an appointment is possible. She is established with [facility]. Please advise regarding what study to order, if any.  Thank you! Annabelle Harman

## 2015-08-23 ENCOUNTER — Other Ambulatory Visit: Payer: Self-pay

## 2015-08-23 DIAGNOSIS — M79606 Pain in leg, unspecified: Secondary | ICD-10-CM

## 2015-09-09 ENCOUNTER — Inpatient Hospital Stay (HOSPITAL_COMMUNITY): Admission: RE | Admit: 2015-09-09 | Payer: 59 | Source: Ambulatory Visit

## 2015-09-12 ENCOUNTER — Encounter: Payer: Self-pay | Admitting: Vascular Surgery

## 2015-09-13 ENCOUNTER — Ambulatory Visit: Payer: 59 | Admitting: Vascular Surgery

## 2015-09-22 ENCOUNTER — Ambulatory Visit (HOSPITAL_COMMUNITY)
Admission: RE | Admit: 2015-09-22 | Discharge: 2015-09-22 | Disposition: A | Discharge status: Home / Self Care | Payer: 59 | Source: Ambulatory Visit | Attending: Vascular Surgery | Admitting: Vascular Surgery

## 2015-09-22 DIAGNOSIS — I872 Venous insufficiency (chronic) (peripheral): Secondary | ICD-10-CM

## 2015-09-22 DIAGNOSIS — M7989 Other specified soft tissue disorders: Secondary | ICD-10-CM | POA: Diagnosis not present

## 2015-09-22 DIAGNOSIS — L819 Disorder of pigmentation, unspecified: Secondary | ICD-10-CM | POA: Diagnosis not present

## 2015-10-07 ENCOUNTER — Encounter: Payer: Self-pay | Admitting: Vascular Surgery

## 2015-10-11 ENCOUNTER — Ambulatory Visit (INDEPENDENT_AMBULATORY_CARE_PROVIDER_SITE_OTHER): Payer: 59 | Admitting: Vascular Surgery

## 2015-10-11 ENCOUNTER — Encounter: Payer: Self-pay | Admitting: Vascular Surgery

## 2015-10-11 VITALS — BP 140/86 | HR 59 | Temp 97.4°F | Resp 16 | Ht 64.0 in | Wt 288.0 lb

## 2015-10-11 DIAGNOSIS — I872 Venous insufficiency (chronic) (peripheral): Secondary | ICD-10-CM | POA: Diagnosis not present

## 2015-10-11 NOTE — Progress Notes (Signed)
Patient name: [name] MRN: [id number] DOB: 1951/07/09 Sex: female   Referred by: Hyacinth MeekerMiller  Reason for referral:  Chief Complaint  Patient presents with  . New Evaluation     C/O Bilateral LE Venous Reflux,  swelling and discoloraltion.    HISTORY OF PRESENT ILLNESS: Patient presents today for discussion of severe bilateral lower extremity venous hypertension. She is known to me from prior staged bilateral great saphenous vein laser ablation in 2012 at that time her noninvasive studies showed both deep and superficial venous reflux. She had the superficial component of her reflux corrected with laser ablation. She over time has had some progression of changes of chronic venous hypertension and is here today for discussion of this. She is concerned about the color changes and the swelling that she has. She has been very compliant with her compression garments. She is morbidly obese weighing 288 pounds. She has no history of arterial insufficiency  Past Medical History  Diagnosis Date  . Seizures (HCC)   . Depression   . Anemia   . Hypertension   . Thyroid disease     hypothyroidism  . Edema     lower extremities  . Ulcer   . Vitamin D deficiency   . Hematochezia   . Hemorrhoid   . Hyperlipidemia   . Emphysema of lung (HCC)   . CHF (congestive heart failure) (HCC)   . Heart murmur   . Migraine   . Enlarged heart     begining stage  . Celiac disease   . Stroke [facility](HCC)     left P-O ICH 2007  . OSA (obstructive sleep apnea)   . Obese   . Headache(784.0) 05/18/2013  . Graves disease     Past Surgical History  Procedure Laterality Date  . Tonsillectomy    . Vein surgery    . Cesarean section    . Iud removal    . Hemorrhoid surgery      Social History   Social History  . Marital Status: Widowed    Spouse Name: N/A  . Number of Children: N/A  . Years of Education: N/A   Occupational History  . Not on file.   Social History Main Topics  . Smoking status:  Never Smoker   . Smokeless tobacco: Never Used  . Alcohol Use: No  . Drug Use: No  . Sexual Activity: No   Other Topics Concern  . Not on file   Social History Narrative    Family History  Problem Relation Age of Onset  . Heart disease Father   . Diabetes Father   . Heart failure Father   . Diabetes Mother   . Thyroid disease Sister   . Diabetes Brother     Allergies as of 10/11/2015  . (No Known Allergies)    Current Outpatient Prescriptions on File Prior to Visit  Medication Sig Dispense Refill  . amLODipine (NORVASC) 10 MG tablet Take 10 mg by mouth daily.    . carvedilol (COREG) 25 MG tablet Take 1 tablet (25 mg total) by mouth 2 (two) times daily with a meal. 60 tablet 5  . Cholecalciferol (VITAMIN D) 2000 UNITS CAPS Take 10,000 Units by mouth daily.     . Cyanocobalamin (VITAMIN B-12 CR PO) Take by mouth daily.      . furosemide (LASIX) 40 MG tablet Take 40 mg by mouth daily as needed for fluid or edema.    . hydrOXYzine (ATARAX/VISTARIL) 25 MG tablet  Take 25 mg by mouth 3 (three) times daily as needed for itching.     . levothyroxine (SYNTHROID, LEVOTHROID) 112 MCG tablet Take 112 mcg by mouth daily.      . Multiple Vitamin (MULTIVITAMIN) capsule Take 1 capsule by mouth daily.      . potassium chloride (K-DUR) 10 MEQ tablet TAKE ONE TABLET BY MOUTH ONCE DAILY AS NEEDED 30 tablet 0  . potassium chloride SA (K-DUR,KLOR-CON) 20 MEQ tablet Take 20 mEq by mouth daily as needed (with lasix).     No current facility-administered medications on file prior to visit.     REVIEW OF SYSTEMS:  Positives indicated with an "X"  CARDIOVASCULAR:   chest pain    chest pressure    palpitations    orthopnea   [x ] dyspnea on exertion    claudication    rest pain    DVT    phlebitis PULMONARY:    productive cough    asthma   [x ] wheezing NEUROLOGIC:    weakness   paresthesias   aphasia   amaurosis   dizziness HEMATOLOGIC:     bleeding problems    clotting disorders MUSCULOSKELETAL:   joint pain    joint swelling GASTROINTESTINAL:   blood in stool    hematemesis GENITOURINARY:    dysuria    hematuria PSYCHIATRIC:   history of major depression INTEGUMENTARY:   rashes  [x ] ulcers CONSTITUTIONAL:   fever    chills  PHYSICAL EXAMINATION:  General: The patient is a well-nourished female, in no acute distress. Vital signs are BP 140/86 mmHg  Pulse 59  Temp(Src) 97.4 F (36.3 C) (Oral)  Resp 16  Ht  (1.626 m)  Wt 288 lb (130.636 kg)  BMI 49.41 kg/m2  SpO2 98% Pulmonary: There is a good air exchange Musculoskeletal: There are no major deformities.  There is no significant extremity pain. Neurologic: No focal weakness or paresthesias are detected, Skin: marked changes of venous hypertension bilaterally right greater than left. Near circumferential hemosiderin deposits on the right leg with thickened skin.iatric: The patient has normal affect. Cardiovascular:  Palpable dorsalis pedis pulses  Noninvasive vascular lab studies were reviewed.   this shows successful ablation of her great saphenous vein bilaterally. Does have reflux in her deep system bilaterally       impression and plan Severe bilateral deep venous reflux. I I reimaged her saphenous vein and this does show closure bilaterally. Explain the only treatment option for her deep venous reflux is elevation and compression. Also discussed the importance of weight loss. She understands and will continue with her compression. We will see her again on an as-needed basis       Jazlen Ogarro [facility] Office: [phone]

## 2016-02-08 DIAGNOSIS — Z0000 Encounter for general adult medical examination without abnormal findings: Secondary | ICD-10-CM | POA: Diagnosis not present

## 2016-02-08 DIAGNOSIS — R7301 Impaired fasting glucose: Secondary | ICD-10-CM | POA: Diagnosis not present

## 2016-02-08 DIAGNOSIS — R7303 Prediabetes: Secondary | ICD-10-CM | POA: Diagnosis not present

## 2016-02-08 DIAGNOSIS — Z6841 Body Mass Index (BMI) 40.0 and over, adult: Secondary | ICD-10-CM | POA: Diagnosis not present

## 2016-02-08 DIAGNOSIS — E05 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: Secondary | ICD-10-CM | POA: Diagnosis not present

## 2016-02-08 DIAGNOSIS — E785 Hyperlipidemia, unspecified: Secondary | ICD-10-CM | POA: Diagnosis not present

## 2016-02-08 DIAGNOSIS — I10 Essential (primary) hypertension: Secondary | ICD-10-CM | POA: Diagnosis not present

## 2016-02-08 DIAGNOSIS — I872 Venous insufficiency (chronic) (peripheral): Secondary | ICD-10-CM | POA: Diagnosis not present

## 2016-02-27 DIAGNOSIS — J209 Acute bronchitis, unspecified: Secondary | ICD-10-CM | POA: Diagnosis not present

## 2016-03-02 DIAGNOSIS — Z6841 Body Mass Index (BMI) 40.0 and over, adult: Secondary | ICD-10-CM | POA: Diagnosis not present

## 2016-03-02 DIAGNOSIS — J45909 Unspecified asthma, uncomplicated: Secondary | ICD-10-CM | POA: Diagnosis not present

## 2016-04-17 DIAGNOSIS — H348122 Central retinal vein occlusion, left eye, stable: Secondary | ICD-10-CM | POA: Diagnosis not present

## 2016-04-25 DIAGNOSIS — H2513 Age-related nuclear cataract, bilateral: Secondary | ICD-10-CM | POA: Diagnosis not present

## 2016-04-25 DIAGNOSIS — H348122 Central retinal vein occlusion, left eye, stable: Secondary | ICD-10-CM | POA: Diagnosis not present

## 2016-04-25 DIAGNOSIS — H524 Presbyopia: Secondary | ICD-10-CM | POA: Diagnosis not present

## 2016-04-25 DIAGNOSIS — H5203 Hypermetropia, bilateral: Secondary | ICD-10-CM | POA: Diagnosis not present

## 2016-09-14 DIAGNOSIS — R7303 Prediabetes: Secondary | ICD-10-CM | POA: Diagnosis not present

## 2016-09-14 DIAGNOSIS — Z6841 Body Mass Index (BMI) 40.0 and over, adult: Secondary | ICD-10-CM | POA: Diagnosis not present

## 2016-09-14 DIAGNOSIS — E7800 Pure hypercholesterolemia, unspecified: Secondary | ICD-10-CM | POA: Diagnosis not present

## 2016-09-14 DIAGNOSIS — I10 Essential (primary) hypertension: Secondary | ICD-10-CM | POA: Diagnosis not present

## 2016-09-14 DIAGNOSIS — E05 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: Secondary | ICD-10-CM | POA: Diagnosis not present

## 2016-09-14 DIAGNOSIS — Z1159 Encounter for screening for other viral diseases: Secondary | ICD-10-CM | POA: Diagnosis not present

## 2016-10-09 ENCOUNTER — Encounter: Payer: Self-pay | Admitting: Cardiovascular Disease

## 2016-10-19 ENCOUNTER — Encounter: Payer: Self-pay | Admitting: Cardiovascular Disease

## 2016-10-19 ENCOUNTER — Encounter (INDEPENDENT_AMBULATORY_CARE_PROVIDER_SITE_OTHER): Payer: Self-pay

## 2016-10-19 ENCOUNTER — Ambulatory Visit (INDEPENDENT_AMBULATORY_CARE_PROVIDER_SITE_OTHER): Payer: Medicare HMO | Admitting: Cardiovascular Disease

## 2016-10-19 VITALS — BP 110/80 | HR 64 | Ht 64.0 in | Wt 291.8 lb

## 2016-10-19 DIAGNOSIS — I10 Essential (primary) hypertension: Secondary | ICD-10-CM

## 2016-10-19 DIAGNOSIS — I5032 Chronic diastolic (congestive) heart failure: Secondary | ICD-10-CM

## 2016-10-19 NOTE — Progress Notes (Signed)
Thurston PoundsNaomi [name] Date of Birth  1951-02-20 [facility]     [place] Office  [address]    [address]   [address] [place], [place]  [address]    [place], [place]  [id number] [phone]  Fax  [phone]  [phone]  Fax [phone]   History of Present Illness:  Problem list: 1. Hypertension 2. Diastolic congestive heart failure 3. Hypothyroid 4. Obesity  5. History of seizures  [name] is a 65 year old female who presented to our office last month with shortness of breath. We started her on Lasix and potassium.  Feels well preserved left ventricular systolic function. She does have mild left ventricular hypertrophy. Her ejection fraction is around 60%.  She is feeling quite a bit better on the Lasix.  A BMP drawn at the time of her echocardiogram reveals that her potassium is 3.8. Her other electrolytes are okay to  She has had some GI bleeding She was found to have some hemorrhoids injected.  July 17, 2012: Pt is doing well.  She has had some lightheadedness and was reduce her Lasix. She now takes a half a tablet Lasix every third or fourth day a week. Otherwise she seems to be feeling fairly well.  May 25, 2013:  She is still very short of breath with exertion.  She has continued to gain weight.  She eats lots of breath.    06/21/2014:  She continues to have problems with weight.   She has borderline diabetes.  No CP, no dyspnea  June 27, 2015:  Was recently diagnosed with possible  Graves disease  ( according to eye doctor )  Breathing has been ok. Does not walk regularly   Nov. 3, 2017:  No CP , has some DOE  Has some wheezing  Has gained some weight .   Has mild leg swelling    Current Outpatient Prescriptions on File Prior to Visit  Medication Sig Dispense Refill  . amLODipine (NORVASC) 10 MG tablet Take 10 mg by mouth daily.    . carvedilol (COREG) 25 MG tablet Take 1 tablet (25 mg total) by mouth 2 (two) times daily with a meal. 60  tablet 5  . Cholecalciferol (VITAMIN D) 2000 UNITS CAPS Take 10,000 Units by mouth daily.     . Cyanocobalamin (VITAMIN B-12 CR PO) Take by mouth daily.      . furosemide (LASIX) 40 MG tablet Take 40 mg by mouth daily as needed for fluid or edema.    . hydrOXYzine (ATARAX/VISTARIL) 25 MG tablet Take 25 mg by mouth 3 (three) times daily as needed for itching.     . Multiple Vitamin (MULTIVITAMIN) capsule Take 1 capsule by mouth daily.      . potassium chloride SA (K-DUR,KLOR-CON) 20 MEQ tablet Take 20 mEq by mouth daily as needed (with lasix).     No current facility-administered medications on file prior to visit.     No Known Allergies  Past Medical History:  Diagnosis Date  . Anemia   . Celiac disease   . CHF (congestive heart failure) (HCC)   . Depression   . Edema    lower extremities  . Emphysema of lung (HCC)   . Enlarged heart    begining stage  . Graves disease   . Headache(784.0) 05/18/2013  . Heart murmur   . Hematochezia   . Hemorrhoid   . Hyperlipidemia   . Hypertension   . Migraine   . Obese   .  OSA (obstructive sleep apnea)   . Seizures (HCC)   . Stroke [facility](HCC)    left P-O ICH 2007  . Thyroid disease    hypothyroidism  . Ulcer (HCC)   . Vitamin D deficiency     Past Surgical History:  Procedure Laterality Date  . CESAREAN SECTION    . HEMORRHOID SURGERY    . IUD REMOVAL    . TONSILLECTOMY    . VEIN SURGERY      History  Smoking Status  . Never Smoker  Smokeless Tobacco  . Never Used    History  Alcohol Use No    Family History  Problem Relation Age of Onset  . Heart disease Father   . Diabetes Father   . Heart failure Father   . Diabetes Mother   . Thyroid disease Sister   . Diabetes Brother     Reviw of Systems:  Reviewed in the HPI.  All other systems are negative.  Physical Exam: BP 110/80   Pulse 64   Ht 5\' 4"  (1.626 m)   Wt 291 lb 12.8 oz (132.4 kg)   SpO2 98%   BMI 50.09 kg/m  The patient is alert and oriented x 3.   The mood and affect are normal.   Skin: warm and dry.  Color is normal.    HEENT:   Normocephalic/atraumatic. Her mucous membranes moist. Her neck is supple.  Lungs: clear   Heart: RR, loud S2    Abdomen: obese, +BS  Extremities:  Trace leg edema, ankles are thick ,   Neuro:  Non focal, gait is normal    ECG: Nov. 3, 2017:  Sinus brady with s1st degree AV block .   HR  57.   NS IVCD.   Assessment / Plan:   1. Hypertension- BP is well controlled.  Continue current meds,  encouraged her to lose weight which will help with her blood pressure control.  2. Diastolic congestive heart failure- BP is well controlled.  We discussed the need for her to lose weight    3. Hypothyroid  4. Obesity - encouraged her to lose weight .   [name] Eppie Barhorst, MD  10/19/2016 10:16 AM    [facility] [address]  [address] [place], [place]  [address] Pager [phone] Phone: [phone]; Fax: [phone]

## 2016-10-19 NOTE — Patient Instructions (Signed)

## 2016-10-24 DIAGNOSIS — H348122 Central retinal vein occlusion, left eye, stable: Secondary | ICD-10-CM | POA: Diagnosis not present

## 2017-01-29 DIAGNOSIS — E785 Hyperlipidemia, unspecified: Secondary | ICD-10-CM | POA: Diagnosis not present

## 2017-01-29 DIAGNOSIS — I10 Essential (primary) hypertension: Secondary | ICD-10-CM | POA: Diagnosis not present

## 2017-01-29 DIAGNOSIS — E039 Hypothyroidism, unspecified: Secondary | ICD-10-CM | POA: Diagnosis not present

## 2017-03-14 DIAGNOSIS — R7303 Prediabetes: Secondary | ICD-10-CM | POA: Diagnosis not present

## 2017-03-14 DIAGNOSIS — E039 Hypothyroidism, unspecified: Secondary | ICD-10-CM | POA: Diagnosis not present

## 2017-03-14 DIAGNOSIS — E785 Hyperlipidemia, unspecified: Secondary | ICD-10-CM | POA: Diagnosis not present

## 2017-03-14 DIAGNOSIS — I10 Essential (primary) hypertension: Secondary | ICD-10-CM | POA: Diagnosis not present

## 2017-06-14 DIAGNOSIS — E05 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: Secondary | ICD-10-CM | POA: Diagnosis not present

## 2017-06-14 DIAGNOSIS — E039 Hypothyroidism, unspecified: Secondary | ICD-10-CM | POA: Diagnosis not present

## 2017-07-03 DIAGNOSIS — H348122 Central retinal vein occlusion, left eye, stable: Secondary | ICD-10-CM | POA: Diagnosis not present

## 2017-07-03 DIAGNOSIS — H43812 Vitreous degeneration, left eye: Secondary | ICD-10-CM | POA: Diagnosis not present

## 2017-08-07 DIAGNOSIS — D649 Anemia, unspecified: Secondary | ICD-10-CM | POA: Insufficient documentation

## 2017-08-07 DIAGNOSIS — K900 Celiac disease: Secondary | ICD-10-CM | POA: Insufficient documentation

## 2017-08-07 DIAGNOSIS — E05 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: Secondary | ICD-10-CM | POA: Diagnosis not present

## 2017-08-07 DIAGNOSIS — Z8659 Personal history of other mental and behavioral disorders: Secondary | ICD-10-CM | POA: Insufficient documentation

## 2017-08-07 DIAGNOSIS — E039 Hypothyroidism, unspecified: Secondary | ICD-10-CM | POA: Diagnosis not present

## 2017-08-14 DIAGNOSIS — E039 Hypothyroidism, unspecified: Secondary | ICD-10-CM | POA: Diagnosis not present

## 2017-08-14 DIAGNOSIS — E05 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: Secondary | ICD-10-CM | POA: Diagnosis not present

## 2017-10-21 DIAGNOSIS — E7800 Pure hypercholesterolemia, unspecified: Secondary | ICD-10-CM | POA: Diagnosis not present

## 2017-10-21 DIAGNOSIS — M791 Myalgia, unspecified site: Secondary | ICD-10-CM | POA: Diagnosis not present

## 2017-10-21 DIAGNOSIS — E039 Hypothyroidism, unspecified: Secondary | ICD-10-CM | POA: Diagnosis not present

## 2017-10-21 DIAGNOSIS — I10 Essential (primary) hypertension: Secondary | ICD-10-CM | POA: Diagnosis not present

## 2017-10-21 DIAGNOSIS — E559 Vitamin D deficiency, unspecified: Secondary | ICD-10-CM | POA: Diagnosis not present

## 2017-11-16 DIAGNOSIS — M13161 Monoarthritis, not elsewhere classified, right knee: Secondary | ICD-10-CM | POA: Diagnosis not present

## 2017-11-16 DIAGNOSIS — G3184 Mild cognitive impairment, so stated: Secondary | ICD-10-CM | POA: Diagnosis not present

## 2017-11-16 DIAGNOSIS — E039 Hypothyroidism, unspecified: Secondary | ICD-10-CM | POA: Diagnosis not present

## 2017-11-16 DIAGNOSIS — Z0000 Encounter for general adult medical examination without abnormal findings: Secondary | ICD-10-CM | POA: Diagnosis not present

## 2017-11-16 DIAGNOSIS — E785 Hyperlipidemia, unspecified: Secondary | ICD-10-CM | POA: Diagnosis not present

## 2017-11-16 DIAGNOSIS — R0609 Other forms of dyspnea: Secondary | ICD-10-CM | POA: Diagnosis not present

## 2017-11-16 DIAGNOSIS — E559 Vitamin D deficiency, unspecified: Secondary | ICD-10-CM | POA: Diagnosis not present

## 2017-11-16 DIAGNOSIS — I10 Essential (primary) hypertension: Secondary | ICD-10-CM | POA: Diagnosis not present

## 2017-11-16 DIAGNOSIS — H9313 Tinnitus, bilateral: Secondary | ICD-10-CM | POA: Diagnosis not present

## 2017-12-31 DIAGNOSIS — H348122 Central retinal vein occlusion, left eye, stable: Secondary | ICD-10-CM | POA: Diagnosis not present

## 2017-12-31 DIAGNOSIS — H43812 Vitreous degeneration, left eye: Secondary | ICD-10-CM | POA: Diagnosis not present

## 2018-02-03 DIAGNOSIS — H5203 Hypermetropia, bilateral: Secondary | ICD-10-CM | POA: Diagnosis not present

## 2018-02-03 DIAGNOSIS — E05 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: Secondary | ICD-10-CM | POA: Diagnosis not present

## 2018-02-03 DIAGNOSIS — E119 Type 2 diabetes mellitus without complications: Secondary | ICD-10-CM | POA: Diagnosis not present

## 2018-02-03 DIAGNOSIS — H2513 Age-related nuclear cataract, bilateral: Secondary | ICD-10-CM | POA: Diagnosis not present

## 2018-03-17 ENCOUNTER — Ambulatory Visit: Payer: Medicare HMO | Admitting: Cardiovascular Disease

## 2018-03-19 ENCOUNTER — Ambulatory Visit: Payer: Medicare HMO | Admitting: Cardiovascular Disease

## 2018-03-19 ENCOUNTER — Encounter: Payer: Self-pay | Admitting: Cardiovascular Disease

## 2018-03-19 VITALS — BP 110/86 | HR 59 | Ht 64.0 in | Wt 278.1 lb

## 2018-03-19 DIAGNOSIS — I10 Essential (primary) hypertension: Secondary | ICD-10-CM

## 2018-03-19 DIAGNOSIS — I5032 Chronic diastolic (congestive) heart failure: Secondary | ICD-10-CM

## 2018-03-19 NOTE — Patient Instructions (Signed)
Medication Instructions:  Your physician recommends that you continue on your current medications as directed. Please refer to the Current Medication list given to you today.   Labwork: None Ordered   Testing/Procedures: None Ordered   Follow-Up: Your physician wants you to follow-up in: 1 year with Dr. Nahser.  You will receive a reminder letter in the mail two months in advance. If you don't receive a letter, please call our office to schedule the follow-up appointment.   If you need a refill on your cardiac medications before your next appointment, please call your pharmacy.   Thank you for choosing [facility]! Massimiliano Rohleder, RN [phone]    

## 2018-03-19 NOTE — Progress Notes (Signed)
[name] PoundsNaomi [name] Date of Birth  September 25, 1951 [facility]     [place] Office  [address]    [address]   [address] [place], [place]  [address]    [place], [place]  [id number] [phone]  Fax  [phone]  [phone]  Fax [phone]   History of Present Illness:  Problem list: 1. Hypertension 2. Diastolic congestive heart failure 3. Hypothyroid 4. Obesity  5. History of seizures  [name] is a 67 year old female who presented to our office last month with shortness of breath. We started her on Lasix and potassium.  Feels well preserved left ventricular systolic function. She does have mild left ventricular hypertrophy. Her ejection fraction is around 60%.  She is feeling quite a bit better on the Lasix.  A BMP drawn at the time of her echocardiogram reveals that her potassium is 3.8. Her other electrolytes are okay to  She has had some GI bleeding She was found to have some hemorrhoids injected.  July 17, 2012: Pt is doing well.  She has had some lightheadedness and was reduce her Lasix. She now takes a half a tablet Lasix every third or fourth day a week. Otherwise she seems to be feeling fairly well.  May 25, 2013:  She is still very short of breath with exertion.  She has continued to gain weight.  She eats lots of breath.    06/21/2014:  She continues to have problems with weight.   She has borderline diabetes.  No CP, no dyspnea  June 27, 2015:  Was recently diagnosed with possible  Graves disease  ( according to eye doctor )  Breathing has been ok. Does not walk regularly   Nov. 3, 2017:  No CP , has some DOE  Has some wheezing  Has gained some weight .   Has mild leg swelling   March 19, 2018: [name] is seen today for follow-up of her hypertension, chronic diastolic congestive heart failure and morbid obesity.  She was last seen in November, 2017.  Has DOE walking into the office. Has lost some weight.   10 lbs weight loss since Nov. 2018.     Has leg pain at night - no claudication pain with walking ( she asked about our PAD poster)  Primary MD is managing her lipids   Current Outpatient Medications on File Prior to Visit  Medication Sig Dispense Refill  . amLODipine (NORVASC) 10 MG tablet Take 10 mg by mouth daily.    Marland Kitchen. atorvastatin (LIPITOR) 10 MG tablet Take 10 mg by mouth daily.    . carvedilol (COREG) 25 MG tablet Take 1 tablet (25 mg total) by mouth 2 (two) times daily with a meal. 60 tablet 5  . Cholecalciferol (VITAMIN D3) 10000 units TABS Take 10,000 tablets by mouth daily.    . Cyanocobalamin (VITAMIN B-12 CR PO) Take 1 tablet by mouth daily.     . Fluticasone-Salmeterol (ADVAIR DISKUS) 250-50 MCG/DOSE AEPB Inhale 1 puff into the lungs daily as needed for allergies.    . furosemide (LASIX) 40 MG tablet Take 40 mg by mouth daily as needed for fluid or edema.    . hydrOXYzine (ATARAX/VISTARIL) 25 MG tablet Take 25 mg by mouth 3 (three) times daily as needed for itching.     . levothyroxine (SYNTHROID, LEVOTHROID) 125 MCG tablet Take 125 mcg by mouth daily.    . Multiple Vitamin (MULTIVITAMIN) capsule Take 1 capsule by mouth daily.      .Marland Kitchen  potassium chloride SA (K-DUR,KLOR-CON) 20 MEQ tablet Take 20 mEq by mouth daily as needed (with lasix).     No current facility-administered medications on file prior to visit.     No Known Allergies  Past Medical History:  Diagnosis Date  . Anemia   . Celiac disease   . CHF (congestive heart failure) (HCC)   . Depression   . Edema    lower extremities  . Emphysema of lung (HCC)   . Enlarged heart    begining stage  . Graves disease   . Headache(784.0) 05/18/2013  . Heart murmur   . Hematochezia   . Hemorrhoid   . Hyperlipidemia   . Hypertension   . Migraine   . Obese   . OSA (obstructive sleep apnea)   . Seizures (HCC)   . Stroke [facility])    left P-O ICH 2007  . Thyroid disease    hypothyroidism  . Ulcer   . Vitamin D deficiency     Past Surgical History:    Procedure Laterality Date  . CESAREAN SECTION    . HEMORRHOID SURGERY    . IUD REMOVAL    . TONSILLECTOMY    . VEIN SURGERY      Social History   Tobacco Use  Smoking Status Never Smoker  Smokeless Tobacco Never Used    Social History   Substance and Sexual Activity  Alcohol Use No    Family History  Problem Relation Age of Onset  . Heart disease Father   . Diabetes Father   . Heart failure Father   . Diabetes Mother   . Thyroid disease Sister   . Diabetes Brother     Reviw of Systems:  Reviewed in the HPI.  All other systems are negative.  Physical Exam: Blood pressure 110/86, pulse (!) 59, height 5\' 4"  (1.626 m), weight 278 lb 2.1 oz (126.2 kg), SpO2 97 %.  GEN:   Morbidly obese female  HEENT: Normal NECK: No JVD; No carotid bruits LYMPHATICS: No lymphadenopathy CARDIAC: Reg Reg.  RESPIRATORY:  Clear to auscultation without rales, wheezing or rhonchi  ABDOMEN: Soft, non-tender, non-distended MUSCULOSKELETAL:  No edema; No deformity  SKIN: Warm and dry NEUROLOGIC:  Alert and oriented x 3   ECG: March 19, 2018: Sinus bradycardia at 59 beats a minute.  Left ventricular hypertrophy with QRS widening.  Assessment / Plan:   1. Hypertension- BP is well controlled.  Continue meds.   Continue with weight loss   2. Diastolic congestive heart failure-     Stable   3. Hypothyroid  4. Obesity -   Has lost 10 lbs over the past 4 months   5.  Leg pain: She has leg pain at night.  She does not have symptoms of claudication with walking.  It was very difficult to feel her pulses because of her body habitus.  I have advised her to see her medical doctor or see an orthopedist for the leg pain that she has at night.  I do not the think that she needs PAD screening at this time but I would have a low threshold to perform PAD screening in the future. Kristeen Miss, MD  03/19/2018 11:24 AM    [facility] [address] [place],  [address] [place], [place]   [address] Pager [phone] Phone: [phone]; Fax: [phone]

## 2018-07-01 DIAGNOSIS — H43812 Vitreous degeneration, left eye: Secondary | ICD-10-CM | POA: Diagnosis not present

## 2018-07-01 DIAGNOSIS — H348122 Central retinal vein occlusion, left eye, stable: Secondary | ICD-10-CM | POA: Diagnosis not present

## 2018-07-02 DIAGNOSIS — I10 Essential (primary) hypertension: Secondary | ICD-10-CM | POA: Diagnosis not present

## 2018-07-02 DIAGNOSIS — R609 Edema, unspecified: Secondary | ICD-10-CM | POA: Diagnosis not present

## 2018-07-02 DIAGNOSIS — J45909 Unspecified asthma, uncomplicated: Secondary | ICD-10-CM | POA: Diagnosis not present

## 2018-07-02 DIAGNOSIS — Z6841 Body Mass Index (BMI) 40.0 and over, adult: Secondary | ICD-10-CM | POA: Diagnosis not present

## 2018-07-02 DIAGNOSIS — E039 Hypothyroidism, unspecified: Secondary | ICD-10-CM | POA: Diagnosis not present

## 2018-07-02 DIAGNOSIS — G8929 Other chronic pain: Secondary | ICD-10-CM | POA: Diagnosis not present

## 2018-07-02 DIAGNOSIS — K08409 Partial loss of teeth, unspecified cause, unspecified class: Secondary | ICD-10-CM | POA: Diagnosis not present

## 2018-07-02 DIAGNOSIS — E785 Hyperlipidemia, unspecified: Secondary | ICD-10-CM | POA: Diagnosis not present

## 2018-07-02 DIAGNOSIS — R32 Unspecified urinary incontinence: Secondary | ICD-10-CM | POA: Diagnosis not present

## 2018-08-13 DIAGNOSIS — I10 Essential (primary) hypertension: Secondary | ICD-10-CM | POA: Diagnosis not present

## 2018-08-13 DIAGNOSIS — R7303 Prediabetes: Secondary | ICD-10-CM | POA: Diagnosis not present

## 2018-08-13 DIAGNOSIS — E7800 Pure hypercholesterolemia, unspecified: Secondary | ICD-10-CM | POA: Diagnosis not present

## 2018-08-13 DIAGNOSIS — E039 Hypothyroidism, unspecified: Secondary | ICD-10-CM | POA: Diagnosis not present

## 2018-08-13 DIAGNOSIS — Z6841 Body Mass Index (BMI) 40.0 and over, adult: Secondary | ICD-10-CM | POA: Diagnosis not present

## 2018-09-12 DIAGNOSIS — R51 Headache: Secondary | ICD-10-CM | POA: Diagnosis not present

## 2018-09-29 DIAGNOSIS — E05 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: Secondary | ICD-10-CM | POA: Diagnosis not present

## 2018-10-28 DIAGNOSIS — H2513 Age-related nuclear cataract, bilateral: Secondary | ICD-10-CM | POA: Diagnosis not present

## 2019-01-02 DIAGNOSIS — H25813 Combined forms of age-related cataract, bilateral: Secondary | ICD-10-CM | POA: Diagnosis not present

## 2019-01-02 DIAGNOSIS — H40053 Ocular hypertension, bilateral: Secondary | ICD-10-CM | POA: Diagnosis not present

## 2019-01-02 DIAGNOSIS — H04123 Dry eye syndrome of bilateral lacrimal glands: Secondary | ICD-10-CM | POA: Diagnosis not present

## 2019-01-02 DIAGNOSIS — H16123 Filamentary keratitis, bilateral: Secondary | ICD-10-CM | POA: Diagnosis not present

## 2019-02-09 DIAGNOSIS — I739 Peripheral vascular disease, unspecified: Secondary | ICD-10-CM | POA: Diagnosis not present

## 2019-02-09 DIAGNOSIS — R32 Unspecified urinary incontinence: Secondary | ICD-10-CM | POA: Diagnosis not present

## 2019-02-09 DIAGNOSIS — E039 Hypothyroidism, unspecified: Secondary | ICD-10-CM | POA: Diagnosis not present

## 2019-02-09 DIAGNOSIS — J439 Emphysema, unspecified: Secondary | ICD-10-CM | POA: Diagnosis not present

## 2019-02-09 DIAGNOSIS — H04129 Dry eye syndrome of unspecified lacrimal gland: Secondary | ICD-10-CM | POA: Diagnosis not present

## 2019-02-09 DIAGNOSIS — I10 Essential (primary) hypertension: Secondary | ICD-10-CM | POA: Diagnosis not present

## 2019-02-09 DIAGNOSIS — Z008 Encounter for other general examination: Secondary | ICD-10-CM | POA: Diagnosis not present

## 2019-02-09 DIAGNOSIS — Z6841 Body Mass Index (BMI) 40.0 and over, adult: Secondary | ICD-10-CM | POA: Diagnosis not present

## 2019-02-09 DIAGNOSIS — R69 Illness, unspecified: Secondary | ICD-10-CM | POA: Diagnosis not present

## 2019-02-09 DIAGNOSIS — E05 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: Secondary | ICD-10-CM | POA: Diagnosis not present

## 2019-02-09 DIAGNOSIS — Z7951 Long term (current) use of inhaled steroids: Secondary | ICD-10-CM | POA: Diagnosis not present

## 2019-02-24 DIAGNOSIS — E7800 Pure hypercholesterolemia, unspecified: Secondary | ICD-10-CM | POA: Diagnosis not present

## 2019-02-24 DIAGNOSIS — E05 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: Secondary | ICD-10-CM | POA: Diagnosis not present

## 2019-02-24 DIAGNOSIS — E039 Hypothyroidism, unspecified: Secondary | ICD-10-CM | POA: Diagnosis not present

## 2019-02-24 DIAGNOSIS — I10 Essential (primary) hypertension: Secondary | ICD-10-CM | POA: Diagnosis not present

## 2019-02-24 DIAGNOSIS — E559 Vitamin D deficiency, unspecified: Secondary | ICD-10-CM | POA: Diagnosis not present

## 2019-02-24 DIAGNOSIS — Z6841 Body Mass Index (BMI) 40.0 and over, adult: Secondary | ICD-10-CM | POA: Diagnosis not present

## 2019-02-24 DIAGNOSIS — R7303 Prediabetes: Secondary | ICD-10-CM | POA: Diagnosis not present

## 2019-02-24 DIAGNOSIS — Z0000 Encounter for general adult medical examination without abnormal findings: Secondary | ICD-10-CM | POA: Diagnosis not present

## 2019-02-25 ENCOUNTER — Other Ambulatory Visit: Payer: Self-pay | Admitting: Family Medicine

## 2019-02-25 DIAGNOSIS — Z1231 Encounter for screening mammogram for malignant neoplasm of breast: Secondary | ICD-10-CM

## 2019-03-18 ENCOUNTER — Telehealth: Payer: Self-pay | Admitting: Nurse Practitioner

## 2019-03-18 NOTE — Telephone Encounter (Signed)
Spoke with patient who would like to review the following information with her daughter who will help her with the technology. She will call back to discuss.   YOUR CARDIOLOGY TEAM HAS ARRANGED FOR AN E-VISIT FOR YOUR APPOINTMENT - PLEASE REVIEW IMPORTANT INFORMATION BELOW SEVERAL DAYS PRIOR TO YOUR APPOINTMENT  Due to the recent COVID-19 pandemic, we are transitioning in-person office visits to tele-medicine visits in an effort to decrease unnecessary exposure to our patients and staff. Medicare and most insurances are covering these visits without a copay needed. We also encourage you to sign up for MyChart if you have not already done so. You will need a smartphone if possible. For patients that do not have this, we can still complete the visit using a regular telephone but do prefer a smartphone to enable video when possible. You may have a close family member that lives with you that can help. If possible, we also ask that you have a blood pressure cuff and scale at home to measure your blood pressure, heart rate and weight prior to your scheduled appointment. Patients with clinical needs that need an in-person evaluation and testing will still be able to come to the office if absolutely necessary. If you have any questions, feel free to call our office.    IF YOU HAVE A SMARTPHONE, PLEASE DOWNLOAD THE WEBEX APP TO YOUR SMARTPHONE  - If Apple, go to [employer] and type in WebEx in the search bar. Download [employer], the blue/green circle. The app is free but as with any other app download, your phone may require you to verify saved payment information or Apple password. You do NOT have to create a WebEx account.  - If Android, go to [employer] and type in [employer] in the search bar. Download [employer], the blue/green circle. The app is free but as with any other app download, your phone may require you to verify saved payment information or Android password. You do NOT have  to create a WebEx account.  It is very helpful to have this downloaded before your visit.    2-3 DAYS BEFORE YOUR APPOINTMENT  You will receive a telephone call from one of our [facility] team members - your caller ID may say "Unknown caller." If this is a video visit, we will confirm that you have been able to download the WebEx app. We will remind you check your blood pressure, heart rate and weight prior to your scheduled appointment. If you have an Apple Watch or Kardia, please upload any pertinent ECG strips the day before or morning of your appointment to MyChart. Our staff will also make sure you have reviewed the consent and agree to move forward with your scheduled tele-health visit.     THE DAY OF YOUR APPOINTMENT  Approximately 15 minutes prior to your scheduled appointment, you will receive a telephone call from one of HeartCare team - your caller ID may say "Unknown caller."  Our staff will confirm medications, vital signs for the day and any symptoms you may be experiencing. Please have this information available prior to the time of visit start. It may also be helpful for you to have a pad of paper and pen handy for any instructions given during your visit. They will also walk you through joining the WebEx smartphone meeting if this is a video visit.    CONSENT FOR TELE-HEALTH VISIT - PLEASE REVIEW  I hereby voluntarily request, consent and authorize [facility]  and its employed or contracted physicians, Producer, television/film/video, nurse practitioners or other licensed health care professionals (the Practitioner), to provide me with telemedicine health care services (the "Services") as deemed necessary by the treating Practitioner. I acknowledge and consent to receive the Services by the Practitioner via telemedicine. I understand that the telemedicine visit will involve communicating with the Practitioner through live audiovisual communication technology and the disclosure of certain  medical information by electronic transmission. I acknowledge that I have been given the opportunity to request an in-person assessment or other available alternative prior to the telemedicine visit and am voluntarily participating in the telemedicine visit.  I understand that I have the right to withhold or withdraw my consent to the use of telemedicine in the course of my care at any time, without affecting my right to future care or treatment, and that the Practitioner or I may terminate the telemedicine visit at any time. I understand that I have the right to inspect all information obtained and/or recorded in the course of the telemedicine visit and may receive copies of available information for a reasonable fee.  I understand that some of the potential risks of receiving the Services via telemedicine include:  Marland Kitchen Delay or interruption in medical evaluation due to technological equipment failure or disruption; . Information transmitted may not be sufficient (e.g. poor resolution of images) to allow for appropriate medical decision making by the Practitioner; and/or  . In rare instances, security protocols could fail, causing a breach of personal health information.  Furthermore, I acknowledge that it is my responsibility to provide information about my medical history, conditions and care that is complete and accurate to the best of my ability. I acknowledge that Practitioner's advice, recommendations, and/or decision may be based on factors not within their control, such as incomplete or inaccurate data provided by me or distortions of diagnostic images or specimens that may result from electronic transmissions. I understand that the practice of medicine is not an exact science and that Practitioner makes no warranties or guarantees regarding treatment outcomes. I acknowledge that I will receive a copy of this consent concurrently upon execution via email to the email address I last provided but may  also request a printed copy by calling the office of [facility].    I understand that my insurance will be billed for this visit.   I have read or had this consent read to me. . I understand the contents of this consent, which adequately explains the benefits and risks of the Services being provided via telemedicine.  . I have been provided ample opportunity to ask questions regarding this consent and the Services and have had my questions answered to my satisfaction. . I give my informed consent for the services to be provided through the use of telemedicine in my medical care  By participating in this telemedicine visit I agree to the above.

## 2019-03-23 NOTE — Telephone Encounter (Signed)
Left message for patient to call back regarding her appointment

## 2019-03-26 NOTE — Telephone Encounter (Signed)
   Cancelled patient's appointment due to no return phone call. Will attempt to reach her later to reschedule.        [name]

## 2019-03-30 ENCOUNTER — Ambulatory Visit: Payer: Medicare HMO

## 2019-03-30 ENCOUNTER — Ambulatory Visit: Payer: Medicare HMO | Admitting: Cardiovascular Disease

## 2019-04-03 DIAGNOSIS — E05 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: Secondary | ICD-10-CM | POA: Diagnosis not present

## 2019-04-03 DIAGNOSIS — H532 Diplopia: Secondary | ICD-10-CM | POA: Diagnosis not present

## 2019-04-06 NOTE — Telephone Encounter (Signed)
Patient's appointment scheduled for August 2020 per her request. I advised her to call sooner with questions or concerns and she thanked me for the call.

## 2019-04-23 DIAGNOSIS — E05 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: Secondary | ICD-10-CM | POA: Diagnosis not present

## 2019-04-23 DIAGNOSIS — H04123 Dry eye syndrome of bilateral lacrimal glands: Secondary | ICD-10-CM | POA: Diagnosis not present

## 2019-04-28 DIAGNOSIS — E05 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: Secondary | ICD-10-CM | POA: Diagnosis not present

## 2019-04-29 DIAGNOSIS — H04123 Dry eye syndrome of bilateral lacrimal glands: Secondary | ICD-10-CM | POA: Diagnosis not present

## 2019-04-29 DIAGNOSIS — E05 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: Secondary | ICD-10-CM | POA: Diagnosis not present

## 2019-05-06 DIAGNOSIS — E039 Hypothyroidism, unspecified: Secondary | ICD-10-CM | POA: Diagnosis not present

## 2019-05-18 ENCOUNTER — Other Ambulatory Visit: Payer: Self-pay

## 2019-05-18 ENCOUNTER — Ambulatory Visit
Admission: RE | Admit: 2019-05-18 | Discharge: 2019-05-18 | Disposition: A | Discharge status: Home / Self Care | Payer: Medicare HMO | Source: Ambulatory Visit | Attending: Family Medicine | Admitting: Family Medicine

## 2019-05-18 DIAGNOSIS — Z1231 Encounter for screening mammogram for malignant neoplasm of breast: Secondary | ICD-10-CM

## 2019-05-28 DIAGNOSIS — H0520 Unspecified exophthalmos: Secondary | ICD-10-CM | POA: Diagnosis not present

## 2019-05-28 DIAGNOSIS — H532 Diplopia: Secondary | ICD-10-CM | POA: Diagnosis not present

## 2019-05-28 DIAGNOSIS — E05 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: Secondary | ICD-10-CM | POA: Diagnosis not present

## 2019-06-09 DIAGNOSIS — E05 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: Secondary | ICD-10-CM | POA: Diagnosis not present

## 2019-06-10 DIAGNOSIS — E039 Hypothyroidism, unspecified: Secondary | ICD-10-CM | POA: Diagnosis not present

## 2019-06-15 DIAGNOSIS — Z111 Encounter for screening for respiratory tuberculosis: Secondary | ICD-10-CM | POA: Diagnosis not present

## 2019-06-29 DIAGNOSIS — E05 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: Secondary | ICD-10-CM | POA: Diagnosis not present

## 2019-06-30 DIAGNOSIS — E05 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: Secondary | ICD-10-CM | POA: Diagnosis not present

## 2019-07-16 DIAGNOSIS — E7800 Pure hypercholesterolemia, unspecified: Secondary | ICD-10-CM | POA: Diagnosis not present

## 2019-07-16 DIAGNOSIS — I5032 Chronic diastolic (congestive) heart failure: Secondary | ICD-10-CM | POA: Diagnosis not present

## 2019-07-16 DIAGNOSIS — I10 Essential (primary) hypertension: Secondary | ICD-10-CM | POA: Diagnosis not present

## 2019-07-16 DIAGNOSIS — J45909 Unspecified asthma, uncomplicated: Secondary | ICD-10-CM | POA: Diagnosis not present

## 2019-07-16 DIAGNOSIS — E039 Hypothyroidism, unspecified: Secondary | ICD-10-CM | POA: Diagnosis not present

## 2019-07-16 DIAGNOSIS — E785 Hyperlipidemia, unspecified: Secondary | ICD-10-CM | POA: Diagnosis not present

## 2019-07-20 DIAGNOSIS — E05 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: Secondary | ICD-10-CM | POA: Diagnosis not present

## 2019-07-21 DIAGNOSIS — E05 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: Secondary | ICD-10-CM | POA: Diagnosis not present

## 2019-07-28 DIAGNOSIS — M79672 Pain in left foot: Secondary | ICD-10-CM | POA: Diagnosis not present

## 2019-08-04 ENCOUNTER — Encounter: Payer: Self-pay | Admitting: Cardiovascular Disease

## 2019-08-05 ENCOUNTER — Encounter: Payer: Self-pay | Admitting: Cardiovascular Disease

## 2019-08-05 ENCOUNTER — Other Ambulatory Visit: Payer: Self-pay

## 2019-08-05 ENCOUNTER — Ambulatory Visit (INDEPENDENT_AMBULATORY_CARE_PROVIDER_SITE_OTHER): Payer: Medicare HMO | Admitting: Cardiovascular Disease

## 2019-08-05 VITALS — BP 118/70 | HR 64 | Ht 63.0 in | Wt 290.8 lb

## 2019-08-05 DIAGNOSIS — I5032 Chronic diastolic (congestive) heart failure: Secondary | ICD-10-CM | POA: Diagnosis not present

## 2019-08-05 DIAGNOSIS — I10 Essential (primary) hypertension: Secondary | ICD-10-CM | POA: Diagnosis not present

## 2019-08-05 NOTE — Patient Instructions (Signed)
Medication Instructions:  Your physician recommends that you continue on your current medications as directed. Please refer to the Current Medication list given to you today.  If you need a refill on your cardiac medications before your next appointment, please call your pharmacy.   Lab work: None ordered  If you have labs (blood work) drawn today and your tests are completely normal, you will receive your results only by: . MyChart Message (if you have MyChart) OR . A paper copy in the mail If you have any lab test that is abnormal or we need to change your treatment, we will call you to review the results.  Testing/Procedures: None ordered   Follow-Up: At [facility], you and your health needs are our priority.  As part of our continuing mission to provide you with exceptional heart care, we have created designated Provider Care Teams.  These Care Teams include your primary Cardiologist (physician) and Advanced Practice Providers (APPs -  Physician Assistants and Nurse Practitioners) who all work together to provide you with the care you need, when you need it. You will need a follow up appointment in:  12 months.  Please call our office 2 months in advance to schedule this appointment.  You may see Philip Nahser, MD or one of the following Advanced Practice Providers on your designated Care Team: Scott Weaver, PA-C Vin Bhagat, PA-C . Janine Hammond, NP  Any Other Special Instructions Will Be Listed Below (If Applicable).    

## 2019-08-05 NOTE — Progress Notes (Signed)
[name] Date of Birth  1951-11-30 [place] [address]    [place]   [place], [place]  [address]    [place], [place]  [address] [phone]  Fax  [phone]  [phone]  Fax [phone]    Problem list: 1. Hypertension 2. Diastolic congestive heart failure 3. Hypothyroid 4. Obesity  5. History of seizures  [name] is a 68 year old female who presented to our office last month with shortness of breath. We started her on Lasix and potassium.  Feels well preserved left ventricular systolic function. She does have mild left ventricular hypertrophy. Her ejection fraction is around 60%.  She is feeling quite a bit better on the Lasix.  A BMP drawn at the time of her echocardiogram reveals that her potassium is 3.8. Her other electrolytes are okay to  She has had some GI bleeding She was found to have some hemorrhoids injected.  July 17, 2012: Pt is doing well.  She has had some lightheadedness and was reduce her Lasix. She now takes a half a tablet Lasix every third or fourth day a week. Otherwise she seems to be feeling fairly well.  May 25, 2013:  She is still very short of breath with exertion.  She has continued to gain weight.  She eats lots of breath.    06/21/2014:  She continues to have problems with weight.   She has borderline diabetes.  No CP, no dyspnea  June 27, 2015:  Was recently diagnosed with possible  Graves disease  ( according to eye doctor )  Breathing has been ok. Does not walk regularly   Nov. 3, 2017:  No CP , has some DOE  Has some wheezing  Has gained some weight .   Has mild leg swelling   March 19, 2018: [name] is seen today for follow-up of her hypertension, chronic diastolic congestive heart failure and morbid obesity.  She was last seen in November, 2017.  Has DOE walking into the office. Has lost some weight.   10 lbs weight loss since Nov. 2018.    Has leg pain at  night - no claudication pain with walking ( she asked about our PAD poster)  Primary MD is managing her lipids   Aug. 19, 2020   Seen for follow of of chronic diastolic CHF.   BP is well controlled.  Is on a new med for her Graves disease.  Wt is 290 today  ( up 12 lbs from last )   Current Outpatient Medications on File Prior to Visit  Medication Sig Dispense Refill  . albuterol (PROAIR HFA) 108 (90 Base) MCG/ACT inhaler Inhale 2 puffs into the lungs every 6 (six) hours as needed for wheezing or shortness of breath.    Marland Kitchen amLODipine (NORVASC) 10 MG tablet Take 10 mg by mouth daily.    Marland Kitchen atorvastatin (LIPITOR) 10 MG tablet Take 10 mg by mouth daily.    . carvedilol (COREG) 25 MG tablet Take 1 tablet (25 mg total) by mouth 2 (two) times daily with a meal. 60 tablet 5  . Cholecalciferol (VITAMIN D3) 10000 units TABS Take 10,000 tablets by mouth daily.    . Cyanocobalamin (VITAMIN B-12 CR PO) Take 1 tablet by mouth daily.     . Fluticasone-Salmeterol (ADVAIR DISKUS) 250-50 MCG/DOSE AEPB Inhale 1 puff into the lungs daily as needed for allergies.    . furosemide (LASIX) 40 MG  tablet Take 40 mg by mouth daily as needed for fluid or edema.    . hydrOXYzine (ATARAX/VISTARIL) 25 MG tablet Take 25 mg by mouth 3 (three) times daily as needed for itching.     . levothyroxine (SYNTHROID, LEVOTHROID) 125 MCG tablet Take 125 mcg by mouth daily.    . Multiple Vitamin (MULTIVITAMIN) capsule Take 1 capsule by mouth daily.      . potassium chloride SA (K-DUR,KLOR-CON) 20 MEQ tablet Take 20 mEq by mouth daily as needed (with lasix).     No current facility-administered medications on file prior to visit.     No Known Allergies  Past Medical History:  Diagnosis Date  . Anemia   . Celiac disease   . CHF (congestive heart failure) (HCC)   . Depression   . Edema    lower extremities  . Emphysema of lung (HCC)   . Enlarged heart    begining stage  . Graves disease   . Headache(784.0) 05/18/2013  .  Heart murmur   . Hematochezia   . Hemorrhoid   . Hyperlipidemia   . Hypertension   . Migraine   . Obese   . OSA (obstructive sleep apnea)   . Seizures (HCC)   . Stroke [facility](HCC)    left P-O ICH 2007  . Thyroid disease    hypothyroidism  . Ulcer   . Vitamin D deficiency     Past Surgical History:  Procedure Laterality Date  . CESAREAN SECTION    . HEMORRHOID SURGERY    . IUD REMOVAL    . TONSILLECTOMY    . VEIN SURGERY      Social History   Tobacco Use  Smoking Status Never Smoker  Smokeless Tobacco Never Used    Social History   Substance and Sexual Activity  Alcohol Use No    Family History  Problem Relation Age of Onset  . Heart disease Father   . Diabetes Father   . Heart failure Father   . Diabetes Mother   . Thyroid disease Sister   . Diabetes Brother     Reviw of Systems:  Reviewed in the HPI.  All other systems are negative.  Physical Exam: There were no vitals taken for this visit.  GEN:  Well nourished, well developed in no acute distress HEENT: Normal NECK: No JVD; No carotid bruits LYMPHATICS: No lymphadenopathy CARDIAC: RRR ,soft systolic murmur  RESPIRATORY:  Clear to auscultation without rales, wheezing or rhonchi  ABDOMEN: Soft, non-tender, non-distended MUSCULOSKELETAL:  No edema; No deformity  SKIN: Warm and dry NEUROLOGIC:  Alert and oriented x 3    ECG:   Aug. 19, 2020  .  NSR with set degree AV block , LVH    Assessment / Plan:   1. Hypertension-  BP is well controlled .  Continue meds Will ask her to have her primary fax recent labs over to us  After her o/v in sept.   2. Diastolic congestive heart failure-     Stable   3. Hypothyroid  4. Obesity -    She has lost some weight recently .  Is up 12 lbs from last year.   Encouraged her to continue to work on this   5.  Leg pain: She has leg pain at night..  Doubt this is due to PAD       . [name] , MD  08/05/2019 9:41 AM    [facility]  [address],  [place], [place]  [address] Pager [phone] Phone: [phone]; Fax: [phone]

## 2019-08-11 DIAGNOSIS — E05 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: Secondary | ICD-10-CM | POA: Diagnosis not present

## 2019-08-12 DIAGNOSIS — E039 Hypothyroidism, unspecified: Secondary | ICD-10-CM | POA: Diagnosis not present

## 2019-08-31 DIAGNOSIS — Z6841 Body Mass Index (BMI) 40.0 and over, adult: Secondary | ICD-10-CM | POA: Diagnosis not present

## 2019-08-31 DIAGNOSIS — J4540 Moderate persistent asthma, uncomplicated: Secondary | ICD-10-CM | POA: Diagnosis not present

## 2019-08-31 DIAGNOSIS — I10 Essential (primary) hypertension: Secondary | ICD-10-CM | POA: Diagnosis not present

## 2019-08-31 DIAGNOSIS — E7800 Pure hypercholesterolemia, unspecified: Secondary | ICD-10-CM | POA: Diagnosis not present

## 2019-08-31 DIAGNOSIS — E039 Hypothyroidism, unspecified: Secondary | ICD-10-CM | POA: Diagnosis not present

## 2019-08-31 DIAGNOSIS — R7303 Prediabetes: Secondary | ICD-10-CM | POA: Diagnosis not present

## 2019-08-31 LAB — LIPID PANEL: LDL Cholesterol: 109

## 2019-08-31 LAB — HEMOGLOBIN A1C: Hemoglobin A1C: 8.2

## 2019-08-31 LAB — BASIC METABOLIC PANEL
Creatinine: 0.9 (ref 0.5–1.1)
Glucose: 178

## 2019-08-31 LAB — TSH: TSH: 0.04 — AB (ref 0.41–5.90)

## 2019-09-01 DIAGNOSIS — E05 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: Secondary | ICD-10-CM | POA: Diagnosis not present

## 2019-09-03 DIAGNOSIS — I5032 Chronic diastolic (congestive) heart failure: Secondary | ICD-10-CM | POA: Diagnosis not present

## 2019-09-03 DIAGNOSIS — E785 Hyperlipidemia, unspecified: Secondary | ICD-10-CM | POA: Diagnosis not present

## 2019-09-03 DIAGNOSIS — J4540 Moderate persistent asthma, uncomplicated: Secondary | ICD-10-CM | POA: Diagnosis not present

## 2019-09-03 DIAGNOSIS — E1169 Type 2 diabetes mellitus with other specified complication: Secondary | ICD-10-CM | POA: Diagnosis not present

## 2019-09-03 DIAGNOSIS — E039 Hypothyroidism, unspecified: Secondary | ICD-10-CM | POA: Diagnosis not present

## 2019-09-03 DIAGNOSIS — E7800 Pure hypercholesterolemia, unspecified: Secondary | ICD-10-CM | POA: Diagnosis not present

## 2019-09-03 DIAGNOSIS — I10 Essential (primary) hypertension: Secondary | ICD-10-CM | POA: Diagnosis not present

## 2019-09-07 DIAGNOSIS — K625 Hemorrhage of anus and rectum: Secondary | ICD-10-CM | POA: Diagnosis not present

## 2019-09-22 ENCOUNTER — Other Ambulatory Visit: Payer: Self-pay

## 2019-09-22 ENCOUNTER — Encounter: Payer: Self-pay | Admitting: Dietician

## 2019-09-22 ENCOUNTER — Encounter: Payer: Medicare HMO | Attending: Family Medicine | Admitting: Dietician

## 2019-09-22 DIAGNOSIS — E119 Type 2 diabetes mellitus without complications: Secondary | ICD-10-CM

## 2019-09-22 NOTE — Progress Notes (Signed)
Patient was seen on 09/22/2019 for the first of a series of three diabetes self-management courses at [facility].  Patient Education Plan per assessed needs and concerns is to attend three course education program for Diabetes Self Management Education.  The following learning objectives were met by the patient during this class:  Describe diabetes, types of diabetes and pathophysiology  State some common risk factors for diabetes  Defines the role of glucose and insulin  Describe the relationship between diabetes and cardiovascular and other risks  State the members of the Healthcare Team  States the rationale for glucose monitoring and when to test  State their individual Target Range  State the importance of logging glucose readings and how to interpret the readings  Identifies A1C target  Explain the correlation between A1c and eAG values  State symptoms and treatment of high blood glucose and low blood glucose  Explain proper technique for glucose testing and identify proper sharps disposal  BG Meter provided:  One Touch Verio Flex  Lot # [id number] x  Expiration Date 04/15/20  She was able to demonstrate it's use and BG was 211 fasting. She reports history of Celiac as well.  She discussed that she feels overwhelmed by another diagnosis.  Discussed that she can make an appointment to see myself or another RD one on one when the classes are over.  Handouts given during class include:  How to Thrive:  A Guide for Your Journey with Diabetes by the ADA  Meal Plan Card and carbohydrate content list  Dietary intake form  Low Sodium Flavoring Tips  Types of Fats  Dining Out  Label reading  Snack list  Planning a balanced meal  The diabetes portion plate  Diabetes Resources  A1c to eAG Conversion Chart  Blood Glucose Log  Diabetes Recommended Care Schedule  Support Group  Diabetes Success Plan  Core Class Satisfaction  Survey   Follow-Up Plan:  Attend core 2

## 2019-09-24 DIAGNOSIS — R69 Illness, unspecified: Secondary | ICD-10-CM | POA: Diagnosis not present

## 2019-09-24 DIAGNOSIS — E05 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: Secondary | ICD-10-CM | POA: Diagnosis not present

## 2019-09-25 ENCOUNTER — Encounter: Payer: Medicare HMO | Admitting: *Deleted

## 2019-09-25 ENCOUNTER — Other Ambulatory Visit: Payer: Self-pay

## 2019-09-25 DIAGNOSIS — E119 Type 2 diabetes mellitus without complications: Secondary | ICD-10-CM | POA: Diagnosis not present

## 2019-09-25 NOTE — Progress Notes (Signed)
Blood Glucose Monitoring Instruction  Appointment Start Time: 4970 Appointment End Time: 0845  Assessment:  Primary concerns today: Patient here for instruction on Blood Glucose Monitoring. They attended Core Class 3 days ago but is having trouble using the meter. When they turn it on with the OK button, the last blood sugar displays with the date of the test, and she can't figure out how to get the current test.  Meter   Brand: One Touch Verio Flex  Medications: no diabetes medications listed on Epic Medication list     Intervention:    Taught patient techniques for using BG monitor and lancing device  Determined she does NOT need to push any buttons on the meter but to just put a clean strip in and the meter comes on automatically. Patient expressed relief.  Discussed need for Rx for strips and lancets   Explained rationale of recording BG both for patient and MD to assess patterns as needed.  Follow Up: Patient offered follow up as needed.

## 2019-09-29 ENCOUNTER — Encounter: Payer: Self-pay | Admitting: Dietician

## 2019-09-29 ENCOUNTER — Other Ambulatory Visit: Payer: Self-pay

## 2019-09-29 ENCOUNTER — Encounter: Payer: Medicare HMO | Admitting: Dietician

## 2019-09-29 DIAGNOSIS — E119 Type 2 diabetes mellitus without complications: Secondary | ICD-10-CM | POA: Diagnosis not present

## 2019-09-29 NOTE — Progress Notes (Signed)
Patient was seen on 09/29/2019 for the second of a series of three diabetes self-management courses at the [facility]. The following learning objectives were met by the patient during this class:   Describe the role of different macronutrients on glucose  Explain how carbohydrates affect blood glucose  State what foods contain the most carbohydrates  Demonstrate carbohydrate counting  Demonstrate how to read Nutrition Facts food label  Describe effects of various fats on heart health  Describe the importance of good nutrition for health and healthy eating strategies  Describe techniques for managing your shopping, cooking and meal planning  List strategies to follow meal plan when dining out  Describe the effects of alcohol on glucose and how to use it safely  Goals:  Follow Diabetes Meal Plan as instructed  Aim to spread carbs evenly throughout the day  Aim for 3 meals per day and snacks as needed Include lean protein foods to meals/snacks  Monitor glucose levels as instructed by your doctor   Follow-Up Plan:  Attend Core 3  Work towards following your personal food plan.

## 2019-10-05 DIAGNOSIS — M1711 Unilateral primary osteoarthritis, right knee: Secondary | ICD-10-CM | POA: Diagnosis not present

## 2019-10-05 DIAGNOSIS — M1712 Unilateral primary osteoarthritis, left knee: Secondary | ICD-10-CM | POA: Diagnosis not present

## 2019-10-06 ENCOUNTER — Encounter: Payer: Self-pay | Admitting: Dietician

## 2019-10-06 ENCOUNTER — Encounter: Payer: Medicare HMO | Admitting: Dietician

## 2019-10-06 ENCOUNTER — Other Ambulatory Visit: Payer: Self-pay

## 2019-10-06 DIAGNOSIS — E119 Type 2 diabetes mellitus without complications: Secondary | ICD-10-CM

## 2019-10-06 NOTE — Progress Notes (Signed)
Patient was seen on 10/06/2019 for the third of a series of three diabetes self-management courses at the [facility].   [name] the amount of activity recommended for healthy living . Describe activities suitable for individual needs . Identify ways to regularly incorporate activity into daily life . Identify barriers to activity and ways to over come these barriers  Identify diabetes medications being personally used and their primary action for lowering glucose and possible side effects . Describe role of stress on blood glucose and develop strategies to address psychosocial issues . Identify diabetes complications and ways to prevent them  Explain how to manage diabetes during illness . Evaluate success in meeting personal goal . Establish 2-3 goals that they will plan to diligently work on  Goals:   I will count my carb choices at most meals and snacks  I will be active 20 minutes or more 3 times a week then increase to 30-45 minutes  I will eat less unhealthy fats  I will remember to check my blood glucose daily and look for patterns.  To help manage stress I will walk at least 3 times a week, read and watch "happy" TV  Your patient has identified these potential barriers to change:  None  Your patient has identified their diabetes self-care support plan as  Family Support    Plan:  Attend Support Group as desired

## 2019-10-07 DIAGNOSIS — H524 Presbyopia: Secondary | ICD-10-CM | POA: Diagnosis not present

## 2019-10-07 DIAGNOSIS — E119 Type 2 diabetes mellitus without complications: Secondary | ICD-10-CM | POA: Diagnosis not present

## 2019-10-07 DIAGNOSIS — H2513 Age-related nuclear cataract, bilateral: Secondary | ICD-10-CM | POA: Diagnosis not present

## 2019-10-07 DIAGNOSIS — H348122 Central retinal vein occlusion, left eye, stable: Secondary | ICD-10-CM | POA: Diagnosis not present

## 2019-10-09 ENCOUNTER — Other Ambulatory Visit: Payer: Self-pay | Admitting: Cardiovascular Disease

## 2019-10-09 ENCOUNTER — Other Ambulatory Visit: Payer: Self-pay

## 2019-10-09 MED ORDER — POTASSIUM CHLORIDE CRYS ER 20 MEQ PO TBCR: 20.0000 meq | EXTENDED_RELEASE_TABLET | Freq: Every day | ORAL | 2 refills | Status: DC | PRN

## 2019-10-12 DIAGNOSIS — M19071 Primary osteoarthritis, right ankle and foot: Secondary | ICD-10-CM | POA: Diagnosis not present

## 2019-10-12 DIAGNOSIS — M19072 Primary osteoarthritis, left ankle and foot: Secondary | ICD-10-CM | POA: Diagnosis not present

## 2019-10-12 DIAGNOSIS — G5791 Unspecified mononeuropathy of right lower limb: Secondary | ICD-10-CM | POA: Diagnosis not present

## 2019-10-13 ENCOUNTER — Other Ambulatory Visit: Payer: Self-pay

## 2019-10-13 ENCOUNTER — Encounter: Payer: Self-pay | Admitting: Endocrinology

## 2019-10-13 ENCOUNTER — Ambulatory Visit (INDEPENDENT_AMBULATORY_CARE_PROVIDER_SITE_OTHER): Payer: Medicare HMO | Admitting: Endocrinology

## 2019-10-13 VITALS — BP 126/74 | HR 71 | Temp 98.3°F | Ht 64.0 in | Wt 276.0 lb

## 2019-10-13 DIAGNOSIS — E1165 Type 2 diabetes mellitus with hyperglycemia: Secondary | ICD-10-CM | POA: Diagnosis not present

## 2019-10-13 DIAGNOSIS — E063 Autoimmune thyroiditis: Secondary | ICD-10-CM

## 2019-10-13 DIAGNOSIS — E05 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: Secondary | ICD-10-CM

## 2019-10-13 LAB — BASIC METABOLIC PANEL
BUN: 15 mg/dL (ref 6–23)
CO2: 31 mEq/L (ref 19–32)
Calcium: 9.7 mg/dL (ref 8.4–10.5)
Chloride: 100 mEq/L (ref 96–112)
Creatinine, Ser: 1.08 mg/dL (ref 0.40–1.20)
GFR: 60.87 mL/min (ref >60.00)
Glucose, Bld: 194 mg/dL — ABNORMAL HIGH (ref 70–99)
Potassium: 4.4 mEq/L (ref 3.5–5.1)
Sodium: 135 mEq/L (ref 135–145)

## 2019-10-13 NOTE — Progress Notes (Signed)
Patient ID: [name], female   DOB: 30-Oct-1951, 68 y.o.   MRN: [id number]            Referring Provider: Sigmund HazelLisa Miller  Reason for Appointment:  Hypothyroidism, new visit    History of Present Illness:   Hypothyroidism was first diagnosed  about the year 2010  At the time of diagnosis patient was having symptoms of  fatigue and reportedly marked weight loss She does not think she had any hyperthyroidism at the onset but no details are available Was treated previously by an endocrinologist with brand-name Synthroid More recently has been treated with levothyroxine  She has [facility], last seen in June She is been for quite some time on 125 mcg of levothyroxine  Even though her TSH was low in September she was not having any palpitations or shakiness Her PCP has reduced her dosage to 112 mcg  She takes her levothyroxine consistently before breakfast in the morning         Patient's weight history is as follows:  Wt Readings from Last 3 Encounters:  10/13/19 276 lb (125.2 kg)  09/22/19 279 lb (126.6 kg)  08/05/19 290 lb 12.8 oz (131.9 kg)   Thyroid function results have been as follows:  Lab Results  Component Value Date   TSH 0.04 (A) 08/31/2019   TSH 2.640 04/10/2011   GRAVES' eye disease:  Since early 2018 she has had prominence of her eyes especially the left Initially this was also associated with some double vision and swelling around the eyes  This has been evaluated and [facility] as Graves' [facility] This has been confirmed with CT scan of the orbits and high thyrotropin receptor antibody  She started taking Tepezza in 8/20 and is getting regular infusions With this her symptoms and signs of improved significantly and she is going to be continued on infusions through next month   Past Medical History:  Diagnosis Date  . Anemia   . Celiac disease   . CHF (congestive heart failure) (HCC)    . Depression   . Diabetes mellitus without complication (HCC)   . Edema    lower extremities  . Emphysema of lung (HCC)   . Enlarged heart    begining stage  . Graves disease   . Headache(784.0) 05/18/2013  . Heart murmur   . Hematochezia   . Hemorrhoid   . Hyperlipidemia   . Hypertension   . Migraine   . Obese   . OSA (obstructive sleep apnea)   . Seizures (HCC)   . Sleep apnea   . Stroke [facility](HCC)    left P-O ICH 2007  . Thyroid disease    hypothyroidism  . Ulcer   . Vitamin D deficiency     Past Surgical History:  Procedure Laterality Date  . CESAREAN SECTION    . HEMORRHOID SURGERY    . IUD REMOVAL    . TONSILLECTOMY    . VEIN SURGERY      Family History  Problem Relation Age of Onset  . Heart disease Father   . Diabetes Father   . Heart failure Father   . Diabetes Mother   . Thyroid disease Sister   . Diabetes Brother     Social History:  reports that she has never smoked. She has never used smokeless tobacco. She reports that she does not drink alcohol or use drugs.  Allergies:  Allergies  Allergen Reactions  .  Gluten Meal     Celiac    Allergies as of 10/13/2019      Reactions   Gluten Meal    Celiac      Medication List       Accurate as of October 13, 2019  9:37 PM. If you have any questions, ask your nurse or doctor.        STOP taking these medications   metFORMIN 500 MG tablet Commonly known as: GLUCOPHAGE Stopped by: Reather Littler, MD     TAKE these medications   Advair Diskus 250-50 MCG/DOSE Aepb Generic drug: Fluticasone-Salmeterol Inhale 1 puff into the lungs daily as needed for allergies.   amLODipine 10 MG tablet Commonly known as: NORVASC Take 10 mg by mouth daily.   carvedilol 25 MG tablet Commonly known as: COREG Take 1 tablet (25 mg total) by mouth 2 (two) times daily with a meal.   furosemide 40 MG tablet Commonly known as: LASIX Take 40 mg by mouth daily as needed for fluid or edema.   hydrOXYzine 25 MG tablet  Commonly known as: ATARAX/VISTARIL Take 25 mg by mouth 3 (three) times daily as needed for itching.   levothyroxine 125 MCG tablet Commonly known as: SYNTHROID Take 125 mcg by mouth daily.   multivitamin capsule Take 1 capsule by mouth daily.   potassium chloride SA 20 MEQ tablet Commonly known as: KLOR-CON Take 1 tablet (20 mEq total) by mouth daily as needed (with lasix).   ProAir HFA 108 (90 Base) MCG/ACT inhaler Generic drug: albuterol Inhale 2 puffs into the lungs every 6 (six) hours as needed for wheezing or shortness of breath.   rosuvastatin 5 MG tablet Commonly known as: CRESTOR Take 1 tablet by mouth daily.   Tepezza 500 MG Solr Generic drug: Teprotumumab-trbw   VITAMIN B-12 CR PO Take 1 tablet by mouth daily.   Vitamin D3 250 MCG (10000 UT) Tabs Take 10,000 tablets by mouth daily.         Review of Systems  Constitutional: Positive for weight loss and reduced appetite.  Eyes: Positive for visual disturbance.  Respiratory: Positive for shortness of breath.   Cardiovascular: Positive for leg swelling.  Gastrointestinal: Negative for nausea.  Endocrine: Positive for cold intolerance. Negative for fatigue.  Genitourinary: Negative for nocturia.  Musculoskeletal: Positive for joint pain.  Skin: Negative for dry skin.  Neurological: Positive for numbness and tingling.  Psychiatric/Behavioral: Negative for depressed mood.   DIABETES:  She was diagnosed to have diabetes on routine lab work on 08/31/2019 Glucose was 178, not clear if this was fasting, drawn at noon She was told to take metformin but she said that she felt strange after taking the first dose with the feeling of unsteadiness, slight dizziness and just not feeling good when she refused to continue this She checks her blood sugar at home when these have been as high as 215 after meals  She has not discussed further treatment with PCP Has been to diabetes classes for meal planning and other  advice recently             Examination:    BP 126/74 (BP Location: Left Arm, Patient Position: Sitting, Cuff Size: Large)   Pulse 71   Temp 98.3 F (36.8 C) (Oral)   Ht 5\' 4"  (1.626 m)   Wt 276 lb (125.2 kg)   SpO2 96%   BMI 47.38 kg/m   GENERAL:  She has marked generalized obesity.   No pallor.  Skin:  no rash or significant skin lesions.  EYES: She has significant prominence of the left eye and less so on the right Also has mild lower lid retraction on the left No chemosis or erythema of the conjunctiva No significant swelling of the eyelids  ENT: Exam not indicated  NECK: No lymphadenopathy  THYROID:  Not palpable.  HEART:  Normal  S1 and S2; no murmur or click.  CHEST:    Lungs: Vescicular breath sounds heard equally.  No crepitations/ wheeze.  ABDOMEN:  No distention.  Liver and spleen not palpable.  No other mass or tenderness.  NEUROLOGICAL: Reflexes are difficult to elicit bilaterally at biceps.  EXTREMITIES:  Normal peripheral joints.  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 Subjectively has done well with her symptoms and signs with TEPEZZA infusions that she is getting from her ophthalmologist at [facility] She will continue to follow-up there  DIABETES: She has a baseline A1c of 8.2 Although she has gone to diabetes classes and is losing weight she still reports postprandial blood sugars over 200  Because of her marked obesity, history of cardiomyopathy, hypertension she is a good  candidate for an SGLT2 drug since she is reportedly intolerant to Metformin   PLAN:   Check thyroid levels today Check blood sugar and fructosamine level today If her fructosamine is higher would recommend that she start taking Jardiance or equivalent medication and will need to coordinate this with her cardiologist  Follow-up   Elayne Snare 10/13/2019, 9:37 PM   Consultation note copy sent to the PCP  Note: This office note was prepared with Dragon voice recognition system technology. Any transcriptional errors that result from this process are unintentional.  ADDENDUM: Fructosamine is 372, will recommend starting Jardiance 10 mg daily, will message cardiologist and PCP regarding this  Elayne Snare

## 2019-10-14 ENCOUNTER — Other Ambulatory Visit: Payer: Self-pay

## 2019-10-14 LAB — FRUCTOSAMINE: Fructosamine: 372 umol/L — ABNORMAL HIGH (ref 0–285)

## 2019-10-14 MED ORDER — JARDIANCE 10 MG PO TABS: 10.0000 mg | ORAL_TABLET | Freq: Every day | ORAL | 2 refills | Status: DC

## 2019-10-14 MED ORDER — AMLODIPINE BESYLATE 5 MG PO TABS: 5.0000 mg | ORAL_TABLET | Freq: Every day | ORAL | 1 refills | Status: DC

## 2019-10-15 DIAGNOSIS — E05 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: Secondary | ICD-10-CM | POA: Diagnosis not present

## 2019-10-15 LAB — T4, FREE: Free T4: 1.46 ng/dL (ref 0.60–1.60)

## 2019-10-15 LAB — TSH: TSH: 0.36 u[IU]/mL (ref 0.35–4.50)

## 2019-10-19 DIAGNOSIS — E1169 Type 2 diabetes mellitus with other specified complication: Secondary | ICD-10-CM | POA: Diagnosis not present

## 2019-10-19 DIAGNOSIS — E039 Hypothyroidism, unspecified: Secondary | ICD-10-CM | POA: Diagnosis not present

## 2019-10-19 LAB — TSH: TSH: 0.3 — AB (ref 0.41–5.90)

## 2019-10-21 ENCOUNTER — Telehealth: Payer: Self-pay | Admitting: Endocrinology

## 2019-10-21 ENCOUNTER — Telehealth: Payer: Self-pay | Admitting: Cardiovascular Disease

## 2019-10-21 MED ORDER — POTASSIUM CHLORIDE ER 10 MEQ PO TBCR: 20.0000 meq | EXTENDED_RELEASE_TABLET | Freq: Every day | ORAL | 3 refills | Status: DC

## 2019-10-21 NOTE — Telephone Encounter (Signed)
New prescription sent for Kdur 10 mEq take 2 tablets daily

## 2019-10-21 NOTE — Telephone Encounter (Signed)
Agree with trying small Kdur ( 10 meq tabs)

## 2019-10-21 NOTE — Telephone Encounter (Signed)
Pt calling stating that her potassium 20 meq tablets are too big and wanted to know if Dr. Acie Fredrickson could send in potassium 10 meq tablets, taking 2 tablets daily to equal 20 meq. Pt stated that she has thyroid problems and the 20 meq tablets are to big to swallow. Please address

## 2019-10-21 NOTE — Telephone Encounter (Signed)
[name] [phone]  Kaimana called to say that Dr Sabra Heck done lab work on Monday and that her thyroid levels were still low, what should she do. I let her know there was note to schedule 6 week follow up and she wanted to know if she should wait the 6 weeks or go ahead and be seen . She does not have a RX for her Synthroid 125 mcg

## 2019-10-21 NOTE — Telephone Encounter (Signed)
Labs were received via fax. Will make sure that Dr. Dwyane Dee gets them.

## 2019-10-22 DIAGNOSIS — R634 Abnormal weight loss: Secondary | ICD-10-CM | POA: Diagnosis not present

## 2019-10-22 DIAGNOSIS — R201 Hypoesthesia of skin: Secondary | ICD-10-CM | POA: Diagnosis not present

## 2019-10-22 DIAGNOSIS — R202 Paresthesia of skin: Secondary | ICD-10-CM | POA: Diagnosis not present

## 2019-10-22 DIAGNOSIS — G5603 Carpal tunnel syndrome, bilateral upper limbs: Secondary | ICD-10-CM | POA: Diagnosis not present

## 2019-10-22 DIAGNOSIS — M5412 Radiculopathy, cervical region: Secondary | ICD-10-CM | POA: Diagnosis not present

## 2019-10-22 DIAGNOSIS — M5417 Radiculopathy, lumbosacral region: Secondary | ICD-10-CM | POA: Diagnosis not present

## 2019-10-22 DIAGNOSIS — G603 Idiopathic progressive neuropathy: Secondary | ICD-10-CM | POA: Diagnosis not present

## 2019-10-22 DIAGNOSIS — G609 Hereditary and idiopathic neuropathy, unspecified: Secondary | ICD-10-CM | POA: Diagnosis not present

## 2019-10-22 DIAGNOSIS — R519 Headache, unspecified: Secondary | ICD-10-CM | POA: Diagnosis not present

## 2019-10-23 DIAGNOSIS — E05 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: Secondary | ICD-10-CM | POA: Diagnosis not present

## 2019-10-26 ENCOUNTER — Other Ambulatory Visit: Payer: Self-pay

## 2019-10-26 DIAGNOSIS — Z20822 Contact with and (suspected) exposure to covid-19: Secondary | ICD-10-CM

## 2019-10-28 LAB — NOVEL CORONAVIRUS, NAA: SARS-CoV-2, NAA: NOT DETECTED

## 2019-11-05 ENCOUNTER — Other Ambulatory Visit: Payer: Self-pay | Admitting: Endocrinology

## 2019-11-05 DIAGNOSIS — I5032 Chronic diastolic (congestive) heart failure: Secondary | ICD-10-CM | POA: Diagnosis not present

## 2019-11-05 DIAGNOSIS — E039 Hypothyroidism, unspecified: Secondary | ICD-10-CM | POA: Diagnosis not present

## 2019-11-05 DIAGNOSIS — Z7984 Long term (current) use of oral hypoglycemic drugs: Secondary | ICD-10-CM | POA: Diagnosis not present

## 2019-11-05 DIAGNOSIS — I10 Essential (primary) hypertension: Secondary | ICD-10-CM | POA: Diagnosis not present

## 2019-11-05 DIAGNOSIS — E7800 Pure hypercholesterolemia, unspecified: Secondary | ICD-10-CM | POA: Diagnosis not present

## 2019-11-05 DIAGNOSIS — J4540 Moderate persistent asthma, uncomplicated: Secondary | ICD-10-CM | POA: Diagnosis not present

## 2019-11-05 DIAGNOSIS — E1169 Type 2 diabetes mellitus with other specified complication: Secondary | ICD-10-CM | POA: Diagnosis not present

## 2019-11-05 DIAGNOSIS — R69 Illness, unspecified: Secondary | ICD-10-CM | POA: Diagnosis not present

## 2019-11-05 DIAGNOSIS — E785 Hyperlipidemia, unspecified: Secondary | ICD-10-CM | POA: Diagnosis not present

## 2019-11-06 DIAGNOSIS — E05 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: Secondary | ICD-10-CM | POA: Diagnosis not present

## 2019-11-09 ENCOUNTER — Telehealth: Payer: Self-pay | Admitting: Endocrinology

## 2019-11-09 NOTE — Telephone Encounter (Signed)
Regarding labs done by PCP on 11/4: The results were similar to what she had 5 days previously in our office and was told to take 6-1/2 pills a week of the levothyroxine 112 mcg. However needs to be seen in follow-up in January with labs again, currently no appointment made

## 2019-11-10 NOTE — Telephone Encounter (Signed)
Called pt and gave her MD message. Pt verbalized understanding and stated that she could not make the appt right now because she was driving. Pt stated that she would call back when she stops driving.

## 2019-11-13 DIAGNOSIS — E05 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: Secondary | ICD-10-CM | POA: Diagnosis not present

## 2019-11-16 DIAGNOSIS — E05 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: Secondary | ICD-10-CM | POA: Diagnosis not present

## 2019-11-30 ENCOUNTER — Other Ambulatory Visit: Payer: Self-pay | Admitting: Endocrinology

## 2019-12-08 ENCOUNTER — Other Ambulatory Visit: Payer: Self-pay | Admitting: Endocrinology

## 2019-12-08 DIAGNOSIS — E063 Autoimmune thyroiditis: Secondary | ICD-10-CM

## 2019-12-08 DIAGNOSIS — E1165 Type 2 diabetes mellitus with hyperglycemia: Secondary | ICD-10-CM

## 2019-12-12 DIAGNOSIS — R05 Cough: Secondary | ICD-10-CM | POA: Diagnosis not present

## 2019-12-13 DIAGNOSIS — U071 COVID-19: Secondary | ICD-10-CM | POA: Diagnosis not present

## 2019-12-13 DIAGNOSIS — R05 Cough: Secondary | ICD-10-CM | POA: Diagnosis not present

## 2019-12-21 ENCOUNTER — Other Ambulatory Visit: Payer: Medicare HMO

## 2019-12-21 ENCOUNTER — Other Ambulatory Visit: Payer: Self-pay | Admitting: Endocrinology

## 2019-12-23 ENCOUNTER — Ambulatory Visit: Payer: Medicare HMO | Admitting: Endocrinology

## 2020-01-07 ENCOUNTER — Other Ambulatory Visit (INDEPENDENT_AMBULATORY_CARE_PROVIDER_SITE_OTHER): Payer: Medicare HMO

## 2020-01-07 ENCOUNTER — Other Ambulatory Visit: Payer: Self-pay

## 2020-01-07 DIAGNOSIS — E1165 Type 2 diabetes mellitus with hyperglycemia: Secondary | ICD-10-CM

## 2020-01-07 DIAGNOSIS — E063 Autoimmune thyroiditis: Secondary | ICD-10-CM | POA: Diagnosis not present

## 2020-01-07 LAB — MICROALBUMIN / CREATININE URINE RATIO
Creatinine,U: 30 mg/dL
Microalb Creat Ratio: 21.6 mg/g (ref 0.0–30.0)
Microalb, Ur: 6.5 mg/dL — ABNORMAL HIGH (ref 0.0–1.9)

## 2020-01-07 LAB — COMPREHENSIVE METABOLIC PANEL
ALT: 14 U/L (ref 0–35)
AST: 16 U/L (ref 0–37)
Albumin: 4.1 g/dL (ref 3.5–5.2)
Alkaline Phosphatase: 64 U/L (ref 39–117)
BUN: 13 mg/dL (ref 6–23)
CO2: 27 mEq/L (ref 19–32)
Calcium: 10.1 mg/dL (ref 8.4–10.5)
Chloride: 100 mEq/L (ref 96–112)
Creatinine, Ser: 0.9 mg/dL (ref 0.40–1.20)
GFR: 75.08 mL/min (ref >60.00)
Glucose, Bld: 154 mg/dL — ABNORMAL HIGH (ref 70–99)
Potassium: 4.4 mEq/L (ref 3.5–5.1)
Sodium: 134 mEq/L — ABNORMAL LOW (ref 135–145)
Total Bilirubin: 0.5 mg/dL (ref 0.2–1.2)
Total Protein: 7.7 g/dL (ref 6.0–8.3)

## 2020-01-07 LAB — T3, FREE: T3, Free: 2.7 pg/mL (ref 2.3–4.2)

## 2020-01-07 LAB — HEMOGLOBIN A1C: Hgb A1c MFr Bld: 7.6 % — ABNORMAL HIGH (ref 4.6–6.5)

## 2020-01-07 LAB — TSH: TSH: 0.48 u[IU]/mL (ref 0.35–4.50)

## 2020-01-07 LAB — T4, FREE: Free T4: 1.29 ng/dL (ref 0.60–1.60)

## 2020-01-12 ENCOUNTER — Other Ambulatory Visit: Payer: Self-pay

## 2020-01-13 ENCOUNTER — Encounter: Payer: Self-pay | Admitting: Endocrinology

## 2020-01-13 ENCOUNTER — Ambulatory Visit (INDEPENDENT_AMBULATORY_CARE_PROVIDER_SITE_OTHER): Payer: Medicare HMO | Admitting: Endocrinology

## 2020-01-13 DIAGNOSIS — E1165 Type 2 diabetes mellitus with hyperglycemia: Secondary | ICD-10-CM | POA: Diagnosis not present

## 2020-01-13 DIAGNOSIS — E05 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: Secondary | ICD-10-CM | POA: Diagnosis not present

## 2020-01-13 DIAGNOSIS — E063 Autoimmune thyroiditis: Secondary | ICD-10-CM | POA: Diagnosis not present

## 2020-01-13 NOTE — Progress Notes (Signed)
Patient ID: [name], female   DOB: 10-18-1951, 69 y.o.   MRN: [id number]            Referring Provider: Kathyrn Lass  Reason for Appointment: Endocrinology follow-up  I connected with the above-named patient by video enabled telemedicine application and verified that I am speaking with the correct person. The patient was explained the limitations of evaluation and management by telemedicine and the availability of in person appointments.  Patient also understood that there may be a patient responsible charge related to this service . Location of the patient: Patient's home . Location of the provider: Physician office Only the patient and myself were participating in the encounter The patient understood the above statements and agreed to proceed.     History of Present Illness:   Hypothyroidism was first diagnosed  about the year 2010  At the time of diagnosis patient was having symptoms of  fatigue and reportedly marked weight loss She does not think she had any hyperthyroidism at the onset but no details are available Was treated previously by an endocrinologist with brand-name Synthroid  She has now been taking levothyroxine 112 mcg, 6-1/2 tablets a week since 10/2019  She did not feel any different with this and no unusual fatigue, heat or cold intolerance or palpitations She does have some dry skin She has lost weight from trying  She takes her levothyroxine very regularly before breakfast in the morning  TSH is now not suppressed at 0.48 and free T4 not as high         Patient's weight history is as follows:  Wt Readings from Last 3 Encounters:  10/13/19 276 lb (125.2 kg)  09/22/19 279 lb (126.6 kg)  08/05/19 290 lb 12.8 oz (131.9 kg)   Thyroid function results have been as follows:  Lab Results  Component Value Date   TSH 0.48 01/07/2020   TSH 0.30 (A) 10/19/2019   TSH 0.36 10/13/2019   TSH 0.04 (A) 08/31/2019   FREET4 1.29 01/07/2020   FREET4 1.46  10/13/2019   T3FREE 2.7 01/07/2020   GRAVES' eye disease:  Since early 2018 she has had prominence of her eyes especially the left Initially this was also associated with some double vision and swelling around the eyes  This has been evaluated and treated as Graves' disease at [facility] clinic This has been confirmed with CT scan of the orbits and high thyrotropin receptor antibody  She started taking Tepezza in 8/20 and has had 8 injections  With this her symptoms and signs of swelling and proptosis have improved significantly Only has mild swelling of the left eyelid and minimal prominence, this was visible on her video call today  She will be following up with [facility] in March  Past Medical History:  Diagnosis Date  . Anemia   . Celiac disease   . CHF (congestive heart failure) ([place])   . Depression   . Diabetes mellitus without complication ([place])   . Edema    lower extremities  . Emphysema of lung ([place])   . Enlarged heart    begining stage  . Graves disease   . Headache(784.0) 05/18/2013  . Heart murmur   . Hematochezia   . Hemorrhoid   . Hyperlipidemia   . Hypertension   . Migraine   . Obese   . OSA (obstructive sleep apnea)   . Seizures ([place])   . Sleep apnea   . Stroke [facility])    left P-O ICH 2007  .  Thyroid disease    hypothyroidism  . Ulcer   . Vitamin D deficiency     Past Surgical History:  Procedure Laterality Date  . CESAREAN SECTION    . HEMORRHOID SURGERY    . IUD REMOVAL    . TONSILLECTOMY    . VEIN SURGERY      Family History  Problem Relation Age of Onset  . Heart disease Father   . Diabetes Father   . Heart failure Father   . Diabetes Mother   . Thyroid disease Sister   . Diabetes Brother     Social History:  reports that she has never smoked. She has never used smokeless tobacco. She reports that she does not drink alcohol or use drugs.  Allergies:  Allergies  Allergen Reactions  . Gluten Meal     Celiac    Allergies as of  01/13/2020      Reactions   Gluten Meal    Celiac      Medication List       Accurate as of January 13, 2020  1:01 PM. If you have any questions, ask your nurse or doctor.        STOP taking these medications   hydrOXYzine 25 MG tablet Commonly known as: ATARAX/VISTARIL Stopped by: Reather Littler, MD   Jardiance 10 MG Tabs tablet Generic drug: empagliflozin Stopped by: Reather Littler, MD   multivitamin capsule Stopped by: Reather Littler, MD     TAKE these medications   Advair Diskus 250-50 MCG/DOSE Aepb Generic drug: Fluticasone-Salmeterol Inhale 1 puff into the lungs daily as needed for allergies.   amLODipine 5 MG tablet Commonly known as: NORVASC TAKE 1 TABLET BY MOUTH EVERY DAY What changed:   how much to take  how to take this  when to take this   carvedilol 25 MG tablet Commonly known as: COREG Take 1 tablet (25 mg total) by mouth 2 (two) times daily with a meal.   furosemide 40 MG tablet Commonly known as: LASIX Take 40 mg by mouth daily as needed for fluid or edema.   levothyroxine 112 MCG tablet Commonly known as: SYNTHROID TAKE 1 TABLET IN THE MORNING ON AN EMPTY STOMACH ONCE A DAY What changed: Another medication with the same name was removed. Continue taking this medication, and follow the directions you see here. Changed by: Reather Littler, MD   potassium chloride 10 MEQ tablet Commonly known as: KLOR-CON Take 2 tablets (20 mEq total) by mouth daily. What changed:   when to take this  reasons to take this   ProAir HFA 108 (90 Base) MCG/ACT inhaler Generic drug: albuterol Inhale 2 puffs into the lungs every 6 (six) hours as needed for wheezing or shortness of breath.   rosuvastatin 5 MG tablet Commonly known as: CRESTOR Take 1 tablet by mouth once a week.   Tepezza 500 MG Solr Generic drug: Teprotumumab-trbw   VITAMIN B-12 CR PO Take 1 tablet by mouth daily.   Vitamin D3 250 MCG (10000 UT) Tabs Take 10,000 tablets by mouth daily.          Review of Systems  HENT:       Dry mouth   DIABETES:  She was diagnosed to have diabetes on routine lab work on 08/31/2019  Glucose was 178 Baseline A1c 8.2  She was told to take metformin but she said that she felt strange after taking the first dose with the feeling of unsteadiness, slight dizziness and just not feeling good  when she refused to continue this  She checks her blood sugar at home only occasionally and has not been able to do this for last 2 weeks because of meter error  Has been to diabetes classes for meal planning Has lost weight with diet changes but is not very active as far as exercise  Lab glucose was 154 fasting and she thinks her highest reading at home was 170 before she stopped using her meter            Lab Results  Component Value Date   HGBA1C 7.6 (H) 01/07/2020   HGBA1C 8.2 08/31/2019   Lab Results  Component Value Date   MICROALBUR 6.5 (H) 01/07/2020   LDLCALC 109 08/31/2019   CREATININE 0.90 01/07/2020     Examination:    There were no vitals taken for this visit.    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 Most of her symptoms are resolved and she has completed a course of Tepezza infusion   DIABETES: She has a baseline A1c of 8.2 and now 7.6  She has lost weight but needs to be treated medically because of persistent hyperglycemia She has not checked her sugars regularly and none recently She will call the meter manufacturer to see why she cannot check her sugars and call if needed for new meter  As discussed before she has obesity, history of cardiomyopathy, hypertension so she is a  good candidate for an SGLT2 drug She agrees to try this, discussed possible side effects especially vaginal candidiasis   PLAN:   Jardiance 10 mg daily Reduce Lasix to 20 mg She will follow up with her PCP in March for repeat chemistry panel To call if she has any side effects She will check her blood pressure at home and let us know if it is unusually low, currently has high normal systolic readings in the 140+ range  No change in levothyroxine regimen  Follow-up 3 months   Ayriana Wix 01/13/2020, 1:01 PM     Note: This office note was prepared with Dragon voice recognition system technology. Any transcriptional errors that result from this process are unintentional.

## 2020-01-14 DIAGNOSIS — E785 Hyperlipidemia, unspecified: Secondary | ICD-10-CM | POA: Diagnosis not present

## 2020-01-14 DIAGNOSIS — E039 Hypothyroidism, unspecified: Secondary | ICD-10-CM | POA: Diagnosis not present

## 2020-01-14 DIAGNOSIS — I5032 Chronic diastolic (congestive) heart failure: Secondary | ICD-10-CM | POA: Diagnosis not present

## 2020-01-14 DIAGNOSIS — E7800 Pure hypercholesterolemia, unspecified: Secondary | ICD-10-CM | POA: Diagnosis not present

## 2020-01-14 DIAGNOSIS — E1169 Type 2 diabetes mellitus with other specified complication: Secondary | ICD-10-CM | POA: Diagnosis not present

## 2020-01-14 DIAGNOSIS — I10 Essential (primary) hypertension: Secondary | ICD-10-CM | POA: Diagnosis not present

## 2020-01-14 DIAGNOSIS — J4540 Moderate persistent asthma, uncomplicated: Secondary | ICD-10-CM | POA: Diagnosis not present

## 2020-01-25 DIAGNOSIS — E039 Hypothyroidism, unspecified: Secondary | ICD-10-CM | POA: Diagnosis not present

## 2020-01-25 DIAGNOSIS — J4540 Moderate persistent asthma, uncomplicated: Secondary | ICD-10-CM | POA: Diagnosis not present

## 2020-01-25 DIAGNOSIS — E785 Hyperlipidemia, unspecified: Secondary | ICD-10-CM | POA: Diagnosis not present

## 2020-01-25 DIAGNOSIS — E1169 Type 2 diabetes mellitus with other specified complication: Secondary | ICD-10-CM | POA: Diagnosis not present

## 2020-01-25 DIAGNOSIS — E7800 Pure hypercholesterolemia, unspecified: Secondary | ICD-10-CM | POA: Diagnosis not present

## 2020-01-25 DIAGNOSIS — I10 Essential (primary) hypertension: Secondary | ICD-10-CM | POA: Diagnosis not present

## 2020-01-25 DIAGNOSIS — I5032 Chronic diastolic (congestive) heart failure: Secondary | ICD-10-CM | POA: Diagnosis not present

## 2020-03-01 DIAGNOSIS — D6869 Other thrombophilia: Secondary | ICD-10-CM | POA: Diagnosis not present

## 2020-03-01 DIAGNOSIS — J439 Emphysema, unspecified: Secondary | ICD-10-CM | POA: Diagnosis not present

## 2020-03-01 DIAGNOSIS — Z6841 Body Mass Index (BMI) 40.0 and over, adult: Secondary | ICD-10-CM | POA: Diagnosis not present

## 2020-03-01 DIAGNOSIS — I4891 Unspecified atrial fibrillation: Secondary | ICD-10-CM | POA: Diagnosis not present

## 2020-03-01 DIAGNOSIS — I509 Heart failure, unspecified: Secondary | ICD-10-CM | POA: Diagnosis not present

## 2020-03-01 DIAGNOSIS — E261 Secondary hyperaldosteronism: Secondary | ICD-10-CM | POA: Diagnosis not present

## 2020-03-01 DIAGNOSIS — E039 Hypothyroidism, unspecified: Secondary | ICD-10-CM | POA: Diagnosis not present

## 2020-03-01 DIAGNOSIS — E1151 Type 2 diabetes mellitus with diabetic peripheral angiopathy without gangrene: Secondary | ICD-10-CM | POA: Diagnosis not present

## 2020-03-01 DIAGNOSIS — I110 Hypertensive heart disease with heart failure: Secondary | ICD-10-CM | POA: Diagnosis not present

## 2020-03-02 DIAGNOSIS — H539 Unspecified visual disturbance: Secondary | ICD-10-CM | POA: Diagnosis not present

## 2020-03-02 DIAGNOSIS — E05 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: Secondary | ICD-10-CM | POA: Diagnosis not present

## 2020-03-04 ENCOUNTER — Emergency Department (HOSPITAL_COMMUNITY): Admission: EM | Admit: 2020-03-04 | Discharge: 2020-03-04 | Discharge status: Absent without leave | Payer: Medicare HMO

## 2020-03-04 ENCOUNTER — Other Ambulatory Visit: Payer: Self-pay

## 2020-03-09 DIAGNOSIS — R69 Illness, unspecified: Secondary | ICD-10-CM | POA: Diagnosis not present

## 2020-03-15 ENCOUNTER — Other Ambulatory Visit: Payer: Self-pay | Admitting: Endocrinology

## 2020-03-15 DIAGNOSIS — Z6841 Body Mass Index (BMI) 40.0 and over, adult: Secondary | ICD-10-CM | POA: Diagnosis not present

## 2020-03-15 DIAGNOSIS — E039 Hypothyroidism, unspecified: Secondary | ICD-10-CM | POA: Diagnosis not present

## 2020-03-15 DIAGNOSIS — Z7984 Long term (current) use of oral hypoglycemic drugs: Secondary | ICD-10-CM | POA: Diagnosis not present

## 2020-03-15 DIAGNOSIS — I10 Essential (primary) hypertension: Secondary | ICD-10-CM | POA: Diagnosis not present

## 2020-03-15 DIAGNOSIS — E7800 Pure hypercholesterolemia, unspecified: Secondary | ICD-10-CM | POA: Diagnosis not present

## 2020-03-15 DIAGNOSIS — E1169 Type 2 diabetes mellitus with other specified complication: Secondary | ICD-10-CM | POA: Diagnosis not present

## 2020-03-23 DIAGNOSIS — R69 Illness, unspecified: Secondary | ICD-10-CM | POA: Diagnosis not present

## 2020-04-04 DIAGNOSIS — H539 Unspecified visual disturbance: Secondary | ICD-10-CM | POA: Diagnosis not present

## 2020-04-04 DIAGNOSIS — E119 Type 2 diabetes mellitus without complications: Secondary | ICD-10-CM | POA: Diagnosis not present

## 2020-04-04 DIAGNOSIS — Z8679 Personal history of other diseases of the circulatory system: Secondary | ICD-10-CM | POA: Diagnosis not present

## 2020-04-04 DIAGNOSIS — H35033 Hypertensive retinopathy, bilateral: Secondary | ICD-10-CM | POA: Diagnosis not present

## 2020-04-04 DIAGNOSIS — H538 Other visual disturbances: Secondary | ICD-10-CM | POA: Diagnosis not present

## 2020-04-07 DIAGNOSIS — J4540 Moderate persistent asthma, uncomplicated: Secondary | ICD-10-CM | POA: Diagnosis not present

## 2020-04-07 DIAGNOSIS — E785 Hyperlipidemia, unspecified: Secondary | ICD-10-CM | POA: Diagnosis not present

## 2020-04-07 DIAGNOSIS — E7800 Pure hypercholesterolemia, unspecified: Secondary | ICD-10-CM | POA: Diagnosis not present

## 2020-04-07 DIAGNOSIS — R69 Illness, unspecified: Secondary | ICD-10-CM | POA: Diagnosis not present

## 2020-04-07 DIAGNOSIS — E1169 Type 2 diabetes mellitus with other specified complication: Secondary | ICD-10-CM | POA: Diagnosis not present

## 2020-04-07 DIAGNOSIS — I5032 Chronic diastolic (congestive) heart failure: Secondary | ICD-10-CM | POA: Diagnosis not present

## 2020-04-07 DIAGNOSIS — I10 Essential (primary) hypertension: Secondary | ICD-10-CM | POA: Diagnosis not present

## 2020-04-07 DIAGNOSIS — E039 Hypothyroidism, unspecified: Secondary | ICD-10-CM | POA: Diagnosis not present

## 2020-04-09 DIAGNOSIS — E119 Type 2 diabetes mellitus without complications: Secondary | ICD-10-CM | POA: Insufficient documentation

## 2020-04-09 DIAGNOSIS — E1122 Type 2 diabetes mellitus with diabetic chronic kidney disease: Secondary | ICD-10-CM | POA: Insufficient documentation

## 2020-04-09 DIAGNOSIS — N1831 Chronic kidney disease, stage 3a: Secondary | ICD-10-CM | POA: Insufficient documentation

## 2020-04-09 DIAGNOSIS — H35033 Hypertensive retinopathy, bilateral: Secondary | ICD-10-CM | POA: Insufficient documentation

## 2020-05-03 DIAGNOSIS — Z6841 Body Mass Index (BMI) 40.0 and over, adult: Secondary | ICD-10-CM | POA: Diagnosis not present

## 2020-05-03 DIAGNOSIS — E1169 Type 2 diabetes mellitus with other specified complication: Secondary | ICD-10-CM | POA: Diagnosis not present

## 2020-05-03 DIAGNOSIS — Z0000 Encounter for general adult medical examination without abnormal findings: Secondary | ICD-10-CM | POA: Diagnosis not present

## 2020-05-03 DIAGNOSIS — E785 Hyperlipidemia, unspecified: Secondary | ICD-10-CM | POA: Diagnosis not present

## 2020-05-03 DIAGNOSIS — E039 Hypothyroidism, unspecified: Secondary | ICD-10-CM | POA: Diagnosis not present

## 2020-05-03 DIAGNOSIS — J4540 Moderate persistent asthma, uncomplicated: Secondary | ICD-10-CM | POA: Diagnosis not present

## 2020-05-03 DIAGNOSIS — I10 Essential (primary) hypertension: Secondary | ICD-10-CM | POA: Diagnosis not present

## 2020-05-10 ENCOUNTER — Other Ambulatory Visit: Payer: Self-pay

## 2020-05-10 ENCOUNTER — Other Ambulatory Visit: Payer: Self-pay | Admitting: Endocrinology

## 2020-05-10 MED ORDER — LEVOTHYROXINE SODIUM 112 MCG PO TABS: ORAL_TABLET | ORAL | 3 refills | Status: DC

## 2020-05-13 ENCOUNTER — Other Ambulatory Visit: Payer: Self-pay

## 2020-05-13 MED ORDER — LEVOTHYROXINE SODIUM 112 MCG PO TABS: ORAL_TABLET | ORAL | 3 refills | Status: DC

## 2020-05-21 ENCOUNTER — Other Ambulatory Visit: Payer: Self-pay

## 2020-05-21 ENCOUNTER — Encounter (HOSPITAL_COMMUNITY): Payer: Self-pay | Admitting: Emergency Medicine

## 2020-05-21 ENCOUNTER — Emergency Department (HOSPITAL_COMMUNITY): Payer: Medicare HMO

## 2020-05-21 ENCOUNTER — Emergency Department (HOSPITAL_COMMUNITY)
Admission: EM | Admit: 2020-05-21 | Discharge: 2020-05-21 | Disposition: A | Discharge status: Home / Self Care | Payer: Medicare HMO | Attending: Emergency Medicine | Admitting: Emergency Medicine

## 2020-05-21 DIAGNOSIS — I110 Hypertensive heart disease with heart failure: Secondary | ICD-10-CM | POA: Diagnosis not present

## 2020-05-21 DIAGNOSIS — E119 Type 2 diabetes mellitus without complications: Secondary | ICD-10-CM | POA: Diagnosis not present

## 2020-05-21 DIAGNOSIS — Z79899 Other long term (current) drug therapy: Secondary | ICD-10-CM | POA: Insufficient documentation

## 2020-05-21 DIAGNOSIS — J069 Acute upper respiratory infection, unspecified: Secondary | ICD-10-CM | POA: Diagnosis not present

## 2020-05-21 DIAGNOSIS — R350 Frequency of micturition: Secondary | ICD-10-CM | POA: Diagnosis not present

## 2020-05-21 DIAGNOSIS — J984 Other disorders of lung: Secondary | ICD-10-CM | POA: Diagnosis not present

## 2020-05-21 DIAGNOSIS — E039 Hypothyroidism, unspecified: Secondary | ICD-10-CM | POA: Diagnosis not present

## 2020-05-21 DIAGNOSIS — Z20822 Contact with and (suspected) exposure to covid-19: Secondary | ICD-10-CM | POA: Diagnosis not present

## 2020-05-21 DIAGNOSIS — R059 Cough, unspecified: Secondary | ICD-10-CM

## 2020-05-21 DIAGNOSIS — J80 Acute respiratory distress syndrome: Secondary | ICD-10-CM | POA: Diagnosis not present

## 2020-05-21 DIAGNOSIS — R05 Cough: Secondary | ICD-10-CM | POA: Diagnosis not present

## 2020-05-21 DIAGNOSIS — I5032 Chronic diastolic (congestive) heart failure: Secondary | ICD-10-CM | POA: Diagnosis not present

## 2020-05-21 DIAGNOSIS — J90 Pleural effusion, not elsewhere classified: Secondary | ICD-10-CM | POA: Diagnosis not present

## 2020-05-21 LAB — URINALYSIS, ROUTINE W REFLEX MICROSCOPIC
Bilirubin Urine: NEGATIVE
Glucose, UA: NEGATIVE mg/dL
Hgb urine dipstick: NEGATIVE
Ketones, ur: NEGATIVE mg/dL
Leukocytes,Ua: NEGATIVE
Nitrite: NEGATIVE
Protein, ur: NEGATIVE mg/dL
Specific Gravity, Urine: 1.009 (ref 1.005–1.030)
pH: 7 (ref 5.0–8.0)

## 2020-05-21 LAB — CBG MONITORING, ED: Glucose-Capillary: 97 mg/dL (ref 70–99)

## 2020-05-21 LAB — GROUP A STREP BY PCR: Group A Strep by PCR: NOT DETECTED

## 2020-05-21 LAB — SARS CORONAVIRUS 2 BY RT PCR (HOSPITAL ORDER, PERFORMED IN ~~LOC~~ HOSPITAL LAB): SARS Coronavirus 2: NEGATIVE

## 2020-05-21 MED ORDER — GUAIFENESIN-CODEINE 100-10 MG/5ML PO SOLN: 10.0000 mL | Freq: Three times a day (TID) | ORAL | 0 refills | Status: DC | PRN

## 2020-05-21 MED ORDER — ALBUTEROL SULFATE HFA 108 (90 BASE) MCG/ACT IN AERS
2.0000 | INHALATION_SPRAY | Freq: Once | RESPIRATORY_TRACT | Status: AC
  Administered 2020-05-21: 2 via RESPIRATORY_TRACT
  Filled 2020-05-21: qty 6.7

## 2020-05-21 MED ORDER — AZITHROMYCIN 250 MG PO TABS
250.0000 mg | ORAL_TABLET | Freq: Every day | ORAL | 0 refills | Status: DC
Start: 2020-05-21 — End: 2020-10-04

## 2020-05-21 MED ORDER — AZITHROMYCIN 250 MG PO TABS
250.0000 mg | ORAL_TABLET | Freq: Every day | ORAL | 0 refills | Status: DC
Start: 2020-05-21 — End: 2020-05-21

## 2020-05-21 NOTE — ED Triage Notes (Signed)
Patient arrives complaining of cough that began last Wednesday and urinary frequency. Patient states she has been using OTC meds without success. Patient's cough is productive but with scant mucous. Patient isn't sure if frequency is due to medications or not.

## 2020-05-21 NOTE — ED Notes (Signed)
Pt unable to sign for AVS due to computer not working.

## 2020-05-21 NOTE — ED Provider Notes (Signed)
[facility]-EMERGENCY DEPT Provider Note   CSN: [id number] Arrival date & time: 05/21/20  1930     History Chief Complaint  Patient presents with  . Cough  . Urinary Frequency    [name] is a 69 y.o. female.  She is complaining of 3 to 4 days of URI symptoms with low-grade fever, nasal congestion, sore throat, cough minimally productive, wheezing.  Also is having some urinary frequency.  No abdominal pain no chest pain.  Had her Covid vaccine a while ago.  Positive sick contact with her grandson.  The history is provided by the patient.  URI Presenting symptoms: congestion, cough, fever, rhinorrhea and sore throat   Severity:  Moderate Onset quality:  Gradual Timing:  Constant Progression:  Unchanged Chronicity:  New Relieved by:  Nothing Worsened by:  Nothing Ineffective treatments:  OTC medications Associated symptoms: sneezing and wheezing   Associated symptoms: no headaches and no myalgias   Risk factors: sick contacts   Risk factors: no recent travel        Past Medical History:  Diagnosis Date  . Anemia   . Celiac disease   . CHF (congestive heart failure) ([facility])   . Depression   . Diabetes mellitus without complication ([facility])   . Edema    lower extremities  . Emphysema of lung ([facility])   . Enlarged heart    begining stage  . Graves disease   . Headache(784.0) 05/18/2013  . Heart murmur   . Hematochezia   . Hemorrhoid   . Hyperlipidemia   . Hypertension   . Migraine   . Obese   . OSA (obstructive sleep apnea)   . Seizures ([facility])   . Sleep apnea   . Stroke [facility])    left P-O ICH 2007  . Thyroid disease    hypothyroidism  . Ulcer   . Vitamin D deficiency     Patient Active Problem List   Diagnosis Date Noted  . Diastolic dysfunction 05/25/2013  . Headache(784.0) 05/18/2013  . Chronic diastolic CHF (congestive heart failure) ([facility]) 12/04/2011  . Hemorrhoids, internal, with bleeding 11/28/2011    Past Surgical History:    Procedure Laterality Date  . CESAREAN SECTION    . HEMORRHOID SURGERY    . IUD REMOVAL    . TONSILLECTOMY    . VEIN SURGERY       OB History   No obstetric history on file.     Family History  Problem Relation Age of Onset  . Heart disease Father   . Diabetes Father   . Heart failure Father   . Diabetes Mother   . Thyroid disease Sister   . Diabetes Brother     Social History   Tobacco Use  . Smoking status: Never Smoker  . Smokeless tobacco: Never Used  Substance Use Topics  . Alcohol use: No  . Drug use: No    Home Medications Prior to Admission medications   Medication Sig Start Date End Date Taking? Authorizing Provider  albuterol (PROAIR HFA) 108 (90 Base) MCG/ACT inhaler Inhale 2 puffs into the lungs every 6 (six) hours as needed for wheezing or shortness of breath.    [provider]  amLODipine (NORVASC) 5 MG tablet TAKE 1 TABLET BY MOUTH EVERY DAY Patient taking differently: 10 mg.  11/05/19   Reather Littler, MD  carvedilol (COREG) 25 MG tablet Take 1 tablet (25 mg total) by mouth 2 (two) times daily with a meal. 12/05/12  Nahser, Wonda Cheng, MD  Cholecalciferol (VITAMIN D3) 10000 units TABS Take 10,000 tablets by mouth daily.    [provider]  Cyanocobalamin (VITAMIN B-12 CR PO) Take 1 tablet by mouth daily.     [provider]  Fluticasone-Salmeterol (ADVAIR DISKUS) 250-50 MCG/DOSE AEPB Inhale 1 puff into the lungs daily as needed for allergies.    [provider]  furosemide (LASIX) 40 MG tablet Take 40 mg by mouth daily as needed for fluid or edema.    [provider]  levothyroxine (SYNTHROID) 112 MCG tablet Take 1 tablet by mouth Monday through Saturday and 1/2 tablet on Sunday. 05/13/20   Elayne Snare, MD  potassium chloride (KLOR-CON) 10 MEQ tablet Take 2 tablets (20 mEq total) by mouth daily. Patient taking differently: Take 20 mEq by mouth daily as needed.  10/21/19   Nahser, Wonda Cheng, MD  rosuvastatin  (CRESTOR) 5 MG tablet Take 1 tablet by mouth once a week.     [provider]  TEPEZZA 500 MG SOLR  07/31/19   [provider]    Allergies    Gluten meal  Review of Systems   Review of Systems  Constitutional: Positive for fever.  HENT: Positive for congestion, rhinorrhea, sneezing and sore throat.   Eyes: Negative for visual disturbance.  Respiratory: Positive for cough and wheezing. Negative for shortness of breath.   Cardiovascular: Negative for chest pain.  Gastrointestinal: Negative for abdominal pain.  Genitourinary: Positive for frequency. Negative for dysuria and hematuria.  Musculoskeletal: Negative for myalgias.  Skin: Negative for rash.  Neurological: Negative for headaches.    Physical Exam Updated Vital Signs BP (!) 164/87 (BP Location: Left Arm)   Pulse 80   Temp 100 F (37.8 C) (Oral)   Resp 16   Ht 5\' 2"  (1.575 m)   Wt 125.2 kg   SpO2 96%   BMI 50.50 kg/m   Physical Exam Vitals and nursing note reviewed.  Constitutional:      General: She is not in acute distress.    Appearance: She is well-developed.  HENT:     Head: Normocephalic and atraumatic.     Right Ear: Tympanic membrane normal.     Left Ear: Tympanic membrane normal.     Mouth/Throat:     Mouth: Mucous membranes are moist.     Pharynx: Oropharynx is clear. No oropharyngeal exudate or posterior oropharyngeal erythema.  Eyes:     Conjunctiva/sclera: Conjunctivae normal.  Cardiovascular:     Rate and Rhythm: Normal rate and regular rhythm.     Heart sounds: No murmur.  Pulmonary:     Effort: Pulmonary effort is normal. No respiratory distress.     Breath sounds: Normal breath sounds.  Abdominal:     Palpations: Abdomen is soft.     Tenderness: There is no abdominal tenderness.  Musculoskeletal:        General: No deformity or signs of injury. Normal range of motion.     Cervical back: Neck supple.  Skin:    General: Skin is warm and dry.     Capillary Refill:  Capillary refill takes less than 2 seconds.  Neurological:     General: No focal deficit present.     Mental Status: She is alert.     ED Results / Procedures / Treatments   Labs (all labs ordered are listed, but only abnormal results are displayed) Labs Reviewed  URINALYSIS, ROUTINE W REFLEX MICROSCOPIC - Abnormal; Notable for the following components:  Result Value   Color, Urine STRAW (*)    All other components within normal limits  SARS CORONAVIRUS 2 BY RT PCR (HOSPITAL ORDER, PERFORMED IN [facility])  GROUP A STREP BY PCR  CBG MONITORING, ED    EKG None  Radiology DG Chest Port 1 View  Result Date: 05/21/2020 CLINICAL DATA:  Cough, urinary frequency EXAM: PORTABLE CHEST 1 VIEW COMPARISON:  Radiograph 06/30/2010 FINDINGS: Increased attenuation towards the lung bases likely related to patient body habitus. Accounting for body habitus, the lungs are clear. No consolidation, features of edema, pneumothorax, or effusion. Pulmonary vascularity is normally distributed. The cardiomediastinal contours are unremarkable. No acute osseous or soft tissue abnormality. IMPRESSION: No acute cardiopulmonary disease. Electronically Signed   By: Kreg Shropshire M.D.   On: 05/21/2020 21:22    Procedures Procedures (including critical care time)  Medications Ordered in ED Medications  albuterol (VENTOLIN HFA) 108 (90 Base) MCG/ACT inhaler 2 puff (2 puffs Inhalation Given 05/21/20 2150)    ED Course  I have reviewed the triage vital signs and the nursing notes.  Pertinent labs & imaging results that were available during my care of the patient were reviewed by me and considered in my medical decision making (see chart for details).  Clinical Course as of May 22 942  Sat May 21, 2020  2212 Patient got her swabs done but she does not want to stick around for the results.  She is asking for a prescription for her cough.  She received albuterol inhaler with improvement in her  shortness of breath.  Will cover with antibiotics for bronchitis and a cough suppressant.   [MB]    Clinical Course User Index [MB] Terrilee Files, MD   MDM Rules/Calculators/A&P                     Differential includes Covid, pneumonia, bronchitis, URI, UTI  Ginna Schuur was evaluated in Emergency Department on 05/21/2020 for the symptoms described in the history of present illness. She was evaluated in the context of the global COVID-19 pandemic, which necessitated consideration that the patient might be at risk for infection with the SARS-CoV-2 virus that causes COVID-19. Institutional protocols and algorithms that pertain to the evaluation of patients at risk for COVID-19 are in a state of rapid change based on information released by regulatory bodies including the CDC and federal and state organizations. These policies and algorithms were followed during the patient's care in the ED.  Final Clinical Impression(s) / ED Diagnoses Final diagnoses:  Upper respiratory tract infection, unspecified type  Cough  Urinary frequency    Rx / DC Orders ED Discharge Orders         Ordered    azithromycin (ZITHROMAX Z-PAK) 250 MG tablet  Daily,   Status:  Discontinued     05/21/20 2215    guaiFENesin-codeine 100-10 MG/5ML syrup  3 times daily PRN,   Status:  Discontinued     05/21/20 2215    azithromycin (ZITHROMAX Z-PAK) 250 MG tablet  Daily     05/21/20 2226    guaiFENesin-codeine 100-10 MG/5ML syrup  3 times daily PRN     05/21/20 2226           Terrilee Files, MD 05/22/20 [id number]

## 2020-05-21 NOTE — Discharge Instructions (Addendum)
You were seen in the emergency department for cough runny nose sore throat fever.  Your Covid and strep test were pending at time of discharge.  Please follow-up results in MyChart.  We are starting you on some antibiotics and cough medicine for possible bronchitis.  You can also use the albuterol inhaler 2 puffs every 4-6 hours as needed.  Follow-up with your doctor.  Return to the emergency department for any worsening or concerning symptoms.  If your Covid test is positive you will need to isolate up to 2 weeks.

## 2020-05-31 DIAGNOSIS — E039 Hypothyroidism, unspecified: Secondary | ICD-10-CM | POA: Diagnosis not present

## 2020-05-31 DIAGNOSIS — E1169 Type 2 diabetes mellitus with other specified complication: Secondary | ICD-10-CM | POA: Diagnosis not present

## 2020-05-31 DIAGNOSIS — E785 Hyperlipidemia, unspecified: Secondary | ICD-10-CM | POA: Diagnosis not present

## 2020-05-31 DIAGNOSIS — E7800 Pure hypercholesterolemia, unspecified: Secondary | ICD-10-CM | POA: Diagnosis not present

## 2020-05-31 DIAGNOSIS — J4540 Moderate persistent asthma, uncomplicated: Secondary | ICD-10-CM | POA: Diagnosis not present

## 2020-05-31 DIAGNOSIS — I5032 Chronic diastolic (congestive) heart failure: Secondary | ICD-10-CM | POA: Diagnosis not present

## 2020-05-31 DIAGNOSIS — I10 Essential (primary) hypertension: Secondary | ICD-10-CM | POA: Diagnosis not present

## 2020-06-27 DIAGNOSIS — I10 Essential (primary) hypertension: Secondary | ICD-10-CM | POA: Diagnosis not present

## 2020-06-27 DIAGNOSIS — J4540 Moderate persistent asthma, uncomplicated: Secondary | ICD-10-CM | POA: Diagnosis not present

## 2020-06-27 DIAGNOSIS — I5032 Chronic diastolic (congestive) heart failure: Secondary | ICD-10-CM | POA: Diagnosis not present

## 2020-06-27 DIAGNOSIS — E785 Hyperlipidemia, unspecified: Secondary | ICD-10-CM | POA: Diagnosis not present

## 2020-06-27 DIAGNOSIS — E1169 Type 2 diabetes mellitus with other specified complication: Secondary | ICD-10-CM | POA: Diagnosis not present

## 2020-06-27 DIAGNOSIS — E7800 Pure hypercholesterolemia, unspecified: Secondary | ICD-10-CM | POA: Diagnosis not present

## 2020-06-27 DIAGNOSIS — E039 Hypothyroidism, unspecified: Secondary | ICD-10-CM | POA: Diagnosis not present

## 2020-07-07 ENCOUNTER — Other Ambulatory Visit: Payer: Self-pay | Admitting: Family Medicine

## 2020-07-07 DIAGNOSIS — Z1231 Encounter for screening mammogram for malignant neoplasm of breast: Secondary | ICD-10-CM

## 2020-07-13 ENCOUNTER — Other Ambulatory Visit (INDEPENDENT_AMBULATORY_CARE_PROVIDER_SITE_OTHER): Payer: Medicare HMO

## 2020-07-13 ENCOUNTER — Other Ambulatory Visit: Payer: Self-pay

## 2020-07-13 DIAGNOSIS — E1165 Type 2 diabetes mellitus with hyperglycemia: Secondary | ICD-10-CM | POA: Diagnosis not present

## 2020-07-13 DIAGNOSIS — E063 Autoimmune thyroiditis: Secondary | ICD-10-CM

## 2020-07-13 LAB — TSH: TSH: 8.82 u[IU]/mL — ABNORMAL HIGH (ref 0.35–4.50)

## 2020-07-13 LAB — URINALYSIS, ROUTINE W REFLEX MICROSCOPIC
Bilirubin Urine: NEGATIVE
Hgb urine dipstick: NEGATIVE
Ketones, ur: NEGATIVE
Leukocytes,Ua: NEGATIVE
Nitrite: NEGATIVE
RBC / HPF: NONE SEEN (ref 0–?)
Specific Gravity, Urine: 1.01 (ref 1.000–1.030)
Total Protein, Urine: NEGATIVE
Urine Glucose: NEGATIVE
Urobilinogen, UA: 0.2 (ref 0.0–1.0)
pH: 6 (ref 5.0–8.0)

## 2020-07-13 LAB — COMPREHENSIVE METABOLIC PANEL
ALT: 14 U/L (ref 0–35)
AST: 17 U/L (ref 0–37)
Albumin: 4.1 g/dL (ref 3.5–5.2)
Alkaline Phosphatase: 58 U/L (ref 39–117)
BUN: 16 mg/dL (ref 6–23)
CO2: 28 mEq/L (ref 19–32)
Calcium: 10 mg/dL (ref 8.4–10.5)
Chloride: 105 mEq/L (ref 96–112)
Creatinine, Ser: 1.05 mg/dL (ref 0.40–1.20)
GFR: 62.75 mL/min (ref >60.00)
Glucose, Bld: 100 mg/dL — ABNORMAL HIGH (ref 70–99)
Potassium: 3.9 mEq/L (ref 3.5–5.1)
Sodium: 138 mEq/L (ref 135–145)
Total Bilirubin: 0.4 mg/dL (ref 0.2–1.2)
Total Protein: 7.5 g/dL (ref 6.0–8.3)

## 2020-07-13 LAB — MICROALBUMIN / CREATININE URINE RATIO
Creatinine,U: 28.2 mg/dL
Microalb Creat Ratio: 38.2 mg/g — ABNORMAL HIGH (ref 0.0–30.0)
Microalb, Ur: 10.8 mg/dL — ABNORMAL HIGH (ref 0.0–1.9)

## 2020-07-13 LAB — HEMOGLOBIN A1C: Hgb A1c MFr Bld: 5.9 % (ref 4.6–6.5)

## 2020-07-13 LAB — T4, FREE: Free T4: 0.94 ng/dL (ref 0.60–1.60)

## 2020-07-18 ENCOUNTER — Encounter: Payer: Self-pay | Admitting: Endocrinology

## 2020-07-18 ENCOUNTER — Other Ambulatory Visit: Payer: Self-pay

## 2020-07-18 ENCOUNTER — Ambulatory Visit: Payer: Medicare HMO | Admitting: Endocrinology

## 2020-07-18 VITALS — BP 130/80 | HR 76 | Ht 62.0 in | Wt 280.6 lb

## 2020-07-18 DIAGNOSIS — E05 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: Secondary | ICD-10-CM

## 2020-07-18 DIAGNOSIS — E1169 Type 2 diabetes mellitus with other specified complication: Secondary | ICD-10-CM | POA: Diagnosis not present

## 2020-07-18 DIAGNOSIS — E063 Autoimmune thyroiditis: Secondary | ICD-10-CM | POA: Diagnosis not present

## 2020-07-18 DIAGNOSIS — E669 Obesity, unspecified: Secondary | ICD-10-CM

## 2020-07-18 NOTE — Progress Notes (Signed)
Patient ID: [name], female   DOB: 01-15-1951, 69 y.o.   MRN: [id number]            Referring Provider: Sigmund Hazel  Reason for Appointment: Endocrinology follow-up    History of Present Illness:   Hypothyroidism was first diagnosed  about the year 2010  At the time of diagnosis patient was having symptoms of  fatigue and reportedly marked weight loss She does not think she had any hyperthyroidism at the onset but no details are available Was treated previously by an endocrinologist with brand-name Synthroid  She has now been taking levothyroxine 112 mcg, 6-1/2 tablets a week since 10/2019. Her dose was unchanged in 1/21 when she was last seen for virtual visit  She feels tired on some days recently  She takes her levothyroxine very consistently before breakfast in the morning  TSH is now unusually high at 8.8 compared to low normal level on the last visit at 0.48 and free T4 is slightly lower than usual         Patient's weight history is as follows:  Wt Readings from Last 3 Encounters:  07/18/20 280 lb 9.6 oz (127.3 kg)  05/21/20 276 lb 1.6 oz (125.2 kg)  10/13/19 276 lb (125.2 kg)   Thyroid function results have been as follows:  Lab Results  Component Value Date   TSH 8.82 (H) 07/13/2020   TSH 0.48 01/07/2020   TSH 0.30 (A) 10/19/2019   TSH 0.36 10/13/2019   FREET4 0.94 07/13/2020   FREET4 1.29 01/07/2020   FREET4 1.46 10/13/2019   T3FREE 2.7 01/07/2020   GRAVES' eye disease:  Since early 2018 she has had prominence of her eyes especially the left Initially this was also associated with some double vision and swelling around the eyes  This has been evaluated and treated as Graves' disease at [facility] eye clinic This has been confirmed with CT scan of the orbits and high thyrotropin receptor antibody  She was started on Tepezza in 8/20 and has had 8 injections Although previously her symptoms and signs of swelling and proptosis had improved  significantly she is now having significant bulging of the left eye with some diplopia and swelling She has not been recommended any new treatment  She will be following up with [facility] in a few weeks now  Past Medical History:  Diagnosis Date   Anemia    Celiac disease    CHF (congestive heart failure) (HCC)    Depression    Diabetes mellitus without complication (HCC)    Edema    lower extremities   Emphysema of lung (HCC)    Enlarged heart    begining stage   Graves disease    Headache(784.0) 05/18/2013   Heart murmur    Hematochezia    Hemorrhoid    Hyperlipidemia    Hypertension    Migraine    Obese    OSA (obstructive sleep apnea)    Seizures (HCC)    Sleep apnea    Stroke (HCC)    left P-O ICH 2007   Thyroid disease    hypothyroidism   Ulcer    Vitamin D deficiency     Past Surgical History:  Procedure Laterality Date   CESAREAN SECTION     HEMORRHOID SURGERY     IUD REMOVAL     TONSILLECTOMY     VEIN SURGERY                Family History  Problem  Relation Age of Onset   Heart disease Father    Diabetes Father    Heart failure Father    Diabetes Mother    Thyroid disease Sister    Diabetes Brother     Social History:  reports that she has never smoked. She has never used smokeless tobacco. She reports that she does not drink alcohol and does not use drugs.  Allergies:  Allergies  Allergen Reactions   Gluten Meal     Celiac    Allergies as of 07/18/2020      Reactions   Gluten Meal    Celiac      Medication List       Accurate as of July 18, 2020  4:27 PM. If you have any questions, ask your nurse or doctor.        Advair Diskus 250-50 MCG/DOSE Aepb Generic drug: Fluticasone-Salmeterol Inhale 1 puff into the lungs daily as needed for allergies.   amLODipine 5 MG tablet Commonly known as: NORVASC TAKE 1 TABLET BY MOUTH EVERY DAY What changed:   how much to take  how to take  this  when to take this   azithromycin 250 MG tablet Commonly known as: Zithromax Z-Pak Take 1 tablet (250 mg total) by mouth daily. Take 2 pills the first day.   carvedilol 25 MG tablet Commonly known as: COREG Take 1 tablet (25 mg total) by mouth 2 (two) times daily with a meal.   furosemide 40 MG tablet Commonly known as: LASIX Take 40 mg by mouth daily as needed for fluid or edema.   guaiFENesin-codeine 100-10 MG/5ML syrup Take 10 mLs by mouth 3 (three) times daily as needed for cough.   levothyroxine 112 MCG tablet Commonly known as: SYNTHROID Take 1 tablet by mouth Monday through Saturday and 1/2 tablet on Sunday.   potassium chloride 10 MEQ tablet Commonly known as: KLOR-CON Take 2 tablets (20 mEq total) by mouth daily. What changed:   when to take this  reasons to take this   ProAir HFA 108 (90 Base) MCG/ACT inhaler Generic drug: albuterol Inhale 2 puffs into the lungs every 6 (six) hours as needed for wheezing or shortness of breath.   rosuvastatin 5 MG tablet Commonly known as: CRESTOR Take 1 tablet by mouth once a week.   Tepezza 500 MG Solr Generic drug: Teprotumumab-trbw   VITAMIN B-12 CR PO Take 1 tablet by mouth daily.   Vitamin D3 250 MCG (10000 UT) Tabs Take 10,000 tablets by mouth daily.         Review of Systems  HENT:       Dry mouth   DIABETES:  She was diagnosed to have diabetes on routine lab work on 08/31/2019  Glucose previously was 178 Baseline A1c 8.2  She was told to take metformin but she said that she felt strange after taking the first dose with the feeling of unsteadiness, slight dizziness and just not feeling good when she refused to continue this She was also recommended Jardiance because of her risk factors but she apparently did not start this as recommended She has not checked her blood sugar Weight is about the same  Her A1c is back to normal without any treatment and her fasting glucose is 100             Lab Results  Component Value Date   HGBA1C 5.9 07/13/2020   HGBA1C 7.6 (H) 01/07/2020   HGBA1C 8.2 08/31/2019   Lab Results  Component  Value Date   MICROALBUR 10.8 (H) 07/13/2020   LDLCALC 109 08/31/2019   CREATININE 1.05 07/13/2020     Examination:    BP 130/80 (BP Location: Left Arm, Patient Position: Sitting, Cuff Size: Large)    Pulse 76    Ht 5\' 2"  (1.575 m)    Wt 280 lb 9.6 oz (127.3 kg)    SpO2 97%    BMI 51.32 kg/m     Assessment:  HYPOTHYROIDISM, 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 Initially had done well with Tepezza infusion but has a recurrence now on the left side   DIABETES: She has a baseline A1c of 8.2 and now surprisingly better at 5.9 without any medications  She has maintained some of the weight loss that she had achieved before Currently not monitoring blood sugar   PLAN:   Levothyroxine 7-1/2 tablets a week now, she will take 1-1/2 tablets on Wednesdays Needs follow-up in 6 weeks to make sure thyroid is back to normal She will discuss further treatment for Graves' disease with her ophthalmologist, also may need to consider second opinion with Dr. 01-10-1993 07/18/2020, 4:27 PM     Note: This office note was prepared with Dragon voice recognition system technology. Any transcriptional errors that result from this process are unintentional.

## 2020-07-18 NOTE — Patient Instructions (Signed)
1 1/2 Synthroid on Wednesdays and on others  Lab Results  Component Value Date   TSH 8.82 (H) 07/13/2020

## 2020-07-27 DIAGNOSIS — R69 Illness, unspecified: Secondary | ICD-10-CM | POA: Diagnosis not present

## 2020-08-01 ENCOUNTER — Other Ambulatory Visit: Payer: Self-pay

## 2020-08-01 ENCOUNTER — Ambulatory Visit
Admission: RE | Admit: 2020-08-01 | Discharge: 2020-08-01 | Disposition: A | Discharge status: Home / Self Care | Payer: Medicare HMO | Source: Ambulatory Visit | Attending: Family Medicine | Admitting: Family Medicine

## 2020-08-01 DIAGNOSIS — Z1231 Encounter for screening mammogram for malignant neoplasm of breast: Secondary | ICD-10-CM

## 2020-08-01 DIAGNOSIS — R42 Dizziness and giddiness: Secondary | ICD-10-CM | POA: Diagnosis not present

## 2020-08-04 DIAGNOSIS — E05 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: Secondary | ICD-10-CM | POA: Insufficient documentation

## 2020-08-04 DIAGNOSIS — H5789 Other specified disorders of eye and adnexa: Secondary | ICD-10-CM | POA: Insufficient documentation

## 2020-08-10 DIAGNOSIS — R69 Illness, unspecified: Secondary | ICD-10-CM | POA: Diagnosis not present

## 2020-08-18 DIAGNOSIS — E05 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: Secondary | ICD-10-CM | POA: Diagnosis not present

## 2020-08-23 DIAGNOSIS — H468 Other optic neuritis: Secondary | ICD-10-CM | POA: Diagnosis not present

## 2020-08-23 DIAGNOSIS — E05 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: Secondary | ICD-10-CM | POA: Diagnosis not present

## 2020-08-23 DIAGNOSIS — J342 Deviated nasal septum: Secondary | ICD-10-CM | POA: Diagnosis not present

## 2020-08-24 DIAGNOSIS — H468 Other optic neuritis: Secondary | ICD-10-CM | POA: Insufficient documentation

## 2020-08-24 DIAGNOSIS — E05 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: Secondary | ICD-10-CM | POA: Diagnosis not present

## 2020-08-25 DIAGNOSIS — R69 Illness, unspecified: Secondary | ICD-10-CM | POA: Diagnosis not present

## 2020-08-29 DIAGNOSIS — I491 Atrial premature depolarization: Secondary | ICD-10-CM | POA: Diagnosis not present

## 2020-08-29 DIAGNOSIS — Z01818 Encounter for other preprocedural examination: Secondary | ICD-10-CM | POA: Diagnosis not present

## 2020-08-29 DIAGNOSIS — G4733 Obstructive sleep apnea (adult) (pediatric): Secondary | ICD-10-CM | POA: Diagnosis not present

## 2020-08-29 DIAGNOSIS — Z20822 Contact with and (suspected) exposure to covid-19: Secondary | ICD-10-CM | POA: Diagnosis not present

## 2020-08-29 DIAGNOSIS — E05 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: Secondary | ICD-10-CM | POA: Diagnosis not present

## 2020-08-29 DIAGNOSIS — J342 Deviated nasal septum: Secondary | ICD-10-CM | POA: Diagnosis not present

## 2020-08-29 DIAGNOSIS — Z01810 Encounter for preprocedural cardiovascular examination: Secondary | ICD-10-CM | POA: Diagnosis not present

## 2020-08-29 DIAGNOSIS — H468 Other optic neuritis: Secondary | ICD-10-CM | POA: Diagnosis not present

## 2020-08-29 DIAGNOSIS — Z6841 Body Mass Index (BMI) 40.0 and over, adult: Secondary | ICD-10-CM | POA: Diagnosis not present

## 2020-08-29 DIAGNOSIS — I517 Cardiomegaly: Secondary | ICD-10-CM | POA: Diagnosis not present

## 2020-08-30 ENCOUNTER — Telehealth: Payer: Self-pay | Admitting: Endocrinology

## 2020-08-30 NOTE — Telephone Encounter (Signed)
Placed in your box 

## 2020-08-30 NOTE — Telephone Encounter (Signed)
Patient advises that if medication is changed or RX called in to please send to:  [facility]  [address] [facility] [place]  + Ph: [phone]

## 2020-08-30 NOTE — Telephone Encounter (Signed)
Please advise 

## 2020-08-30 NOTE — Telephone Encounter (Signed)
I need to see the lab report

## 2020-08-30 NOTE — Telephone Encounter (Signed)
Patient called stating she is due to have surgery this Thursday, but her blood work yesterday showed her thyroid level is too high and patient asked if Dr or nurse could call the pre-op person, Carron Brazen at [facility], to talk to her before surgery. [facility] ph# [phone] (they are also faxing information to [place] regarding this)

## 2020-09-01 DIAGNOSIS — Z8616 Personal history of COVID-19: Secondary | ICD-10-CM | POA: Diagnosis not present

## 2020-09-01 DIAGNOSIS — H05829 Myopathy of extraocular muscles, unspecified orbit: Secondary | ICD-10-CM | POA: Diagnosis not present

## 2020-09-01 DIAGNOSIS — E039 Hypothyroidism, unspecified: Secondary | ICD-10-CM | POA: Diagnosis not present

## 2020-09-01 DIAGNOSIS — Z888 Allergy status to other drugs, medicaments and biological substances status: Secondary | ICD-10-CM | POA: Diagnosis not present

## 2020-09-01 DIAGNOSIS — H0520 Unspecified exophthalmos: Secondary | ICD-10-CM | POA: Diagnosis not present

## 2020-09-01 DIAGNOSIS — H468 Other optic neuritis: Secondary | ICD-10-CM | POA: Diagnosis not present

## 2020-09-01 DIAGNOSIS — E05 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: Secondary | ICD-10-CM | POA: Diagnosis not present

## 2020-09-01 DIAGNOSIS — J329 Chronic sinusitis, unspecified: Secondary | ICD-10-CM | POA: Diagnosis not present

## 2020-09-01 DIAGNOSIS — I10 Essential (primary) hypertension: Secondary | ICD-10-CM | POA: Diagnosis not present

## 2020-09-01 DIAGNOSIS — R9389 Abnormal findings on diagnostic imaging of other specified body structures: Secondary | ICD-10-CM | POA: Diagnosis not present

## 2020-09-01 DIAGNOSIS — J3489 Other specified disorders of nose and nasal sinuses: Secondary | ICD-10-CM | POA: Diagnosis not present

## 2020-09-01 DIAGNOSIS — J342 Deviated nasal septum: Secondary | ICD-10-CM | POA: Diagnosis not present

## 2020-09-01 DIAGNOSIS — G4733 Obstructive sleep apnea (adult) (pediatric): Secondary | ICD-10-CM | POA: Diagnosis not present

## 2020-09-04 ENCOUNTER — Telehealth: Payer: Self-pay | Admitting: Endocrinology

## 2020-09-04 NOTE — Telephone Encounter (Signed)
Labs received from [facility] indicating she is getting slightly too much levothyroxine.  TSH is 0.34 as of 08/29/2020. Currently she is taking 1 tablet daily along with extra half tablet on Wednesdays.  Please tell her to change her dosage to 1 tablet on all 7 days a week and see me back in about 6 weeks

## 2020-09-05 ENCOUNTER — Other Ambulatory Visit: Payer: Self-pay

## 2020-09-05 NOTE — Telephone Encounter (Signed)
Called pt and notified her of MD message. Pt verbalized understanding.

## 2020-09-05 NOTE — Telephone Encounter (Signed)
She can keep her appointment

## 2020-09-05 NOTE — Telephone Encounter (Signed)
Called pt and gave her MD message. Pt verbalized understanding.  Pt currently has an appt scheduled at the end of October. Does she need to push this out by approx 2 weeks?

## 2020-09-13 ENCOUNTER — Other Ambulatory Visit: Payer: Medicare HMO

## 2020-09-13 DIAGNOSIS — E05 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: Secondary | ICD-10-CM | POA: Diagnosis not present

## 2020-09-13 DIAGNOSIS — J342 Deviated nasal septum: Secondary | ICD-10-CM | POA: Diagnosis not present

## 2020-09-13 DIAGNOSIS — H468 Other optic neuritis: Secondary | ICD-10-CM | POA: Diagnosis not present

## 2020-09-15 DIAGNOSIS — E05 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: Secondary | ICD-10-CM | POA: Diagnosis not present

## 2020-09-19 ENCOUNTER — Ambulatory Visit: Payer: Medicare HMO | Admitting: Endocrinology

## 2020-09-19 DIAGNOSIS — E05 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: Secondary | ICD-10-CM | POA: Diagnosis not present

## 2020-09-29 ENCOUNTER — Telehealth: Payer: Self-pay | Admitting: Endocrinology

## 2020-09-29 ENCOUNTER — Other Ambulatory Visit: Payer: Self-pay

## 2020-09-29 ENCOUNTER — Other Ambulatory Visit (INDEPENDENT_AMBULATORY_CARE_PROVIDER_SITE_OTHER): Payer: Medicare HMO

## 2020-09-29 DIAGNOSIS — E669 Obesity, unspecified: Secondary | ICD-10-CM

## 2020-09-29 DIAGNOSIS — E1169 Type 2 diabetes mellitus with other specified complication: Secondary | ICD-10-CM

## 2020-09-29 DIAGNOSIS — E063 Autoimmune thyroiditis: Secondary | ICD-10-CM

## 2020-09-29 LAB — TSH: TSH: 1.38 u[IU]/mL (ref 0.35–4.50)

## 2020-09-29 LAB — T4, FREE: Free T4: 1.01 ng/dL (ref 0.60–1.60)

## 2020-09-29 LAB — GLUCOSE, RANDOM: Glucose, Bld: 157 mg/dL — ABNORMAL HIGH (ref 70–99)

## 2020-09-29 NOTE — Telephone Encounter (Signed)
Patient completed and had this office faxed a medical records release to Medical Records

## 2020-09-30 DIAGNOSIS — I499 Cardiac arrhythmia, unspecified: Secondary | ICD-10-CM | POA: Diagnosis not present

## 2020-09-30 DIAGNOSIS — Z6841 Body Mass Index (BMI) 40.0 and over, adult: Secondary | ICD-10-CM | POA: Diagnosis not present

## 2020-09-30 DIAGNOSIS — E1169 Type 2 diabetes mellitus with other specified complication: Secondary | ICD-10-CM | POA: Diagnosis not present

## 2020-10-03 ENCOUNTER — Telehealth: Payer: Self-pay | Admitting: Cardiovascular Disease

## 2020-10-03 NOTE — Telephone Encounter (Signed)
New Message:     Pt said she saw Dr Sigmund Hazel last week. She said Dr Hyacinth Meeker said she heard something in her heart. She dia an EKG while she was there. She talked to her about it. The pt would like for Dr Elease Hashimoto to call Dr Hyacinth Meeker [phone]) please and find out what is going on. If he can not call her, please him to fax her at [phone].  She is very anxious to find out what is going on before she leaves for [place].

## 2020-10-04 ENCOUNTER — Ambulatory Visit (INDEPENDENT_AMBULATORY_CARE_PROVIDER_SITE_OTHER): Payer: Medicare HMO | Admitting: Endocrinology

## 2020-10-04 ENCOUNTER — Other Ambulatory Visit: Payer: Self-pay

## 2020-10-04 ENCOUNTER — Encounter: Payer: Self-pay | Admitting: Endocrinology

## 2020-10-04 VITALS — BP 118/78 | HR 87 | Wt 268.8 lb

## 2020-10-04 DIAGNOSIS — E1165 Type 2 diabetes mellitus with hyperglycemia: Secondary | ICD-10-CM

## 2020-10-04 DIAGNOSIS — E063 Autoimmune thyroiditis: Secondary | ICD-10-CM | POA: Diagnosis not present

## 2020-10-04 MED ORDER — AMLODIPINE BESYLATE 5 MG PO TABS
5.0000 mg | ORAL_TABLET | Freq: Every day | ORAL | 3 refills | Status: DC
Start: 2020-10-04 — End: 2021-06-23

## 2020-10-04 NOTE — Progress Notes (Signed)
Patient ID: [name], female   DOB: 19-Feb-1951, 69 y.o.   MRN: [id number]            Referring Provider: Sigmund Hazel  Reason for Appointment: Endocrinology follow-up    History of Present Illness:   Hypothyroidism was first diagnosed  about the year 2010  At the time of diagnosis patient was having symptoms of  fatigue and reportedly marked weight loss She does not think she had any hyperthyroidism at the onset but no details are available Was treated previously by an endocrinologist with brand-name Synthroid  She has now been taking levothyroxine 112 mcg Although she was supposed to increase her dosage by 1 tablet weekly when her TSH was 8.8 in July she only increased it by half tablet weekly  She feels overall fairly good She thinks she has lost a little weight from working on her diet also  She takes her levothyroxine very consistently before eating in the morning  TSH is now back to normal at 1.38 compared to  8.8         Patient's weight history is as follows:  Wt Readings from Last 3 Encounters:  10/04/20 268 lb 12.8 oz (121.9 kg)  07/18/20 280 lb 9.6 oz (127.3 kg)  05/21/20 276 lb 1.6 oz (125.2 kg)   Thyroid function results have been as follows:  Lab Results  Component Value Date   TSH 1.38 09/29/2020   TSH 8.82 (H) 07/13/2020   TSH 0.48 01/07/2020   TSH 0.30 (A) 10/19/2019   FREET4 1.01 09/29/2020   FREET4 0.94 07/13/2020   FREET4 1.29 01/07/2020   FREET4 1.46 10/13/2019   T3FREE 2.7 01/07/2020   GRAVES' eye disease:  Since early 2018 she has had prominence of her eyes especially the left Initially this was also associated with some double vision and swelling around the eyes  This has been evaluated and treated as Graves' disease at [facility] eye clinic This has been confirmed with CT scan of the orbits and high thyrotropin receptor antibody  She was on [place] in late 2020 and had 8 infusions Although previously her symptoms and signs of  swelling and proptosis had improved significantly  She is now on prednisone because of recurrence of significant bulging of the left eye with some diplopia and swelling  On Prednisone 30mg  currently, started with 60 mg and she thinks this has improved her high symptoms and signs  Past Medical History:  Diagnosis Date  . Anemia   . Celiac disease   . CHF (congestive heart failure) (HCC)   . Depression   . Diabetes mellitus without complication (HCC)   . Edema    lower extremities  . Emphysema of lung (HCC)   . Enlarged heart    begining stage  . Graves disease   . Headache(784.0) 05/18/2013  . Heart murmur   . Hematochezia   . Hemorrhoid   . Hyperlipidemia   . Hypertension   . Migraine   . Obese   . OSA (obstructive sleep apnea)   . Seizures (HCC)   . Sleep apnea   . Stroke [facility])    left P-O ICH 2007  . Thyroid disease    hypothyroidism  . Ulcer   . Vitamin D deficiency     Past Surgical History:  Procedure Laterality Date  . CESAREAN SECTION    . HEMORRHOID SURGERY    . IUD REMOVAL    . TONSILLECTOMY    . VEIN SURGERY  Family History  Problem Relation Age of Onset  . Heart disease Father   . Diabetes Father   . Heart failure Father   . Diabetes Mother   . Thyroid disease Sister   . Diabetes Brother     Social History:  reports that she has never smoked. She has never used smokeless tobacco. She reports that she does not drink alcohol and does not use drugs.  Allergies:  Allergies  Allergen Reactions  . Gluten Meal     Celiac    Allergies as of 10/04/2020      Reactions   Gluten Meal    Celiac      Medication List       Accurate as of October 04, 2020  8:57 AM. If you have any questions, ask your nurse or doctor.        Advair Diskus 250-50 MCG/DOSE Aepb Generic drug: Fluticasone-Salmeterol Inhale 1 puff into the lungs daily as needed for allergies.   amLODipine 10 MG tablet Commonly known as: NORVASC Take 10 mg by mouth daily.     azithromycin 250 MG tablet Commonly known as: Zithromax Z-Pak Take 1 tablet (250 mg total) by mouth daily. Take 2 pills the first day.   carvedilol 25 MG tablet Commonly known as: COREG Take 1 tablet (25 mg total) by mouth 2 (two) times daily with a meal.   furosemide 40 MG tablet Commonly known as: LASIX Take 40 mg by mouth daily as needed for fluid or edema.   glimepiride 4 MG tablet Commonly known as: AMARYL Take 4 mg by mouth daily with breakfast.   guaiFENesin-codeine 100-10 MG/5ML syrup Take 10 mLs by mouth 3 (three) times daily as needed for cough.   levothyroxine 112 MCG tablet Commonly known as: SYNTHROID Take 1 tablet by mouth Monday through Saturday and 1/2 tablet on Sunday. What changed: additional instructions   potassium chloride 10 MEQ tablet Commonly known as: KLOR-CON Take 2 tablets (20 mEq total) by mouth daily. What changed:   when to take this  reasons to take this   ProAir HFA 108 (90 Base) MCG/ACT inhaler Generic drug: albuterol Inhale 2 puffs into the lungs every 6 (six) hours as needed for wheezing or shortness of breath.   rosuvastatin 5 MG tablet Commonly known as: CRESTOR Take 1 tablet by mouth once a week.   Tepezza 500 MG Solr Generic drug: Teprotumumab-trbw   VITAMIN B-12 CR PO Take 1 tablet by mouth daily.   Vitamin D3 250 MCG (10000 UT) Tabs Take 10,000 tablets by mouth daily.         Review of Systems   DIABETES:  She was diagnosed to have diabetes on routine lab work on 08/31/2019  Baseline A1c 8.2  She was told to take metformin but she said that she felt strange after taking the first dose with the feeling of unsteadiness, slight dizziness and just not feeling good when she refused to continue this.  She also now claims that discussed blood in her stools She was also recommended Jardiance because of her risk factors but she was afraid of side effects and did not take it  Since she has been on prednisone her blood  sugars have been occasionally over 200 and her PCP has given her 4 mg of Amaryl She is just starting this 3 days ago and has had blood sugars as low as 72 with this  No recent A1c available  Lab Results  Component Value Date   HGBA1C 5.9 07/13/2020   HGBA1C 7.6 (H) 01/07/2020   HGBA1C 8.2 08/31/2019   Lab Results  Component Value Date   MICROALBUR 10.8 (H) 07/13/2020   LDLCALC 109 08/31/2019   CREATININE 1.05 07/13/2020     Examination:    BP 118/78   Pulse 87   Wt 268 lb 12.8 oz (121.9 kg)   SpO2 97%   BMI 49.16 kg/m     Assessment:  HYPOTHYROIDISM, 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 Currently doing better with prednisone  DIABETES: She has a baseline A1c of 8.2 and now blood sugars are higher with prednisone She has been checking her blood sugars more often now Starting to lose weight although not clear if this is from hyperglycemia   PLAN:   She will continue 112 mcg of levothyroxine daily Follow-up in 4 months  For her diabetes encouraged her to continue efforts to keep her weight down with lower carbohydrate diet Discussed that she likely should be on Jardiance because of her cardiac history and she will discuss further with cardiologist Reassured her that this does not cause ketoacidosis except rarely for insulin-dependent patients Also she may start with 2 mg of Amaryl instead of 4 mg for now, likely cannot afford to brand-name medications  Reather Littler 10/04/2020, 8:57 AM     Note: This office note was prepared with Dragon voice recognition system technology. Any transcriptional errors that result from this process are  unintentional.

## 2020-10-04 NOTE — Patient Instructions (Signed)
Take 1 Synthroid daily  Lab Results  Component Value Date   TSH 1.38 09/29/2020

## 2020-10-04 NOTE — Telephone Encounter (Signed)
    Pt is calling back to follow up. She would like to get a callback from Dr. Harvie Bridge nurse

## 2020-10-04 NOTE — Telephone Encounter (Signed)
Returned call to patient who states she is concerned about her heart since seeing Dr. Hyacinth Meeker. She states Dr. Hyacinth Meeker was explaining something about her heart but she does not know what she was saying; will call her office to request notes be sent to [place]. I advised that no records have been received from Dr. Hyacinth Meeker but that Dr. Lucianne Muss documented that he would like her to discuss starting Jardiance with Dr. Elease Hashimoto States she has been dizzy and feeling off-balance, recently lost 13 lb. States BP has been consistently < 120 mmHg. I advised her to decrease amlodipine to 5 mg daily and continue to monitor BP until she sees Dr. Elease Hashimoto in November. I advised her to call back with questions or concerns prior to appointment. She verbalized understanding and agreement and thanked me for the call.

## 2020-10-05 DIAGNOSIS — E05 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: Secondary | ICD-10-CM | POA: Diagnosis not present

## 2020-10-10 DIAGNOSIS — E119 Type 2 diabetes mellitus without complications: Secondary | ICD-10-CM | POA: Diagnosis not present

## 2020-10-10 DIAGNOSIS — H35033 Hypertensive retinopathy, bilateral: Secondary | ICD-10-CM | POA: Diagnosis not present

## 2020-10-10 DIAGNOSIS — Z8679 Personal history of other diseases of the circulatory system: Secondary | ICD-10-CM | POA: Diagnosis not present

## 2020-10-12 ENCOUNTER — Telehealth: Payer: Self-pay | Admitting: Endocrinology

## 2020-10-12 DIAGNOSIS — E05 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: Secondary | ICD-10-CM | POA: Diagnosis not present

## 2020-10-12 NOTE — Telephone Encounter (Signed)
Patient re: Patient's PCP told patient she will no longer be sending RX's for patient's Diabetic Supplies, therefore patient requests the following RX's:  One Touch Verio Test Strips AND One Touch Verio Lancets  [facility] to:  [id number] [place] [place] - [address] AT [facility] [address] Phone:  [phone]  Fax:  [phone]

## 2020-10-12 NOTE — Telephone Encounter (Signed)
Okay to refill? 

## 2020-10-12 NOTE — Telephone Encounter (Signed)
Last OV 10-04-20  Please advise below message

## 2020-10-13 ENCOUNTER — Other Ambulatory Visit: Payer: Self-pay | Admitting: *Deleted

## 2020-10-13 DIAGNOSIS — Z0110 Encounter for examination of ears and hearing without abnormal findings: Secondary | ICD-10-CM | POA: Diagnosis not present

## 2020-10-13 DIAGNOSIS — H903 Sensorineural hearing loss, bilateral: Secondary | ICD-10-CM | POA: Diagnosis not present

## 2020-10-13 DIAGNOSIS — E1165 Type 2 diabetes mellitus with hyperglycemia: Secondary | ICD-10-CM

## 2020-10-13 DIAGNOSIS — E119 Type 2 diabetes mellitus without complications: Secondary | ICD-10-CM | POA: Diagnosis not present

## 2020-10-13 MED ORDER — ONETOUCH ULTRASOFT LANCETS MISC: 12 refills | Status: AC

## 2020-10-13 MED ORDER — GLUCOSE BLOOD VI STRP: ORAL_STRIP | 12 refills | Status: DC

## 2020-10-13 NOTE — Telephone Encounter (Signed)
Sent Rx lancet and test strips [facility] which pt requested to sent the Rx.

## 2020-10-14 DIAGNOSIS — E039 Hypothyroidism, unspecified: Secondary | ICD-10-CM | POA: Diagnosis not present

## 2020-10-14 DIAGNOSIS — E1169 Type 2 diabetes mellitus with other specified complication: Secondary | ICD-10-CM | POA: Diagnosis not present

## 2020-10-14 DIAGNOSIS — J45909 Unspecified asthma, uncomplicated: Secondary | ICD-10-CM | POA: Diagnosis not present

## 2020-10-14 DIAGNOSIS — I10 Essential (primary) hypertension: Secondary | ICD-10-CM | POA: Diagnosis not present

## 2020-10-14 DIAGNOSIS — E785 Hyperlipidemia, unspecified: Secondary | ICD-10-CM | POA: Diagnosis not present

## 2020-10-14 DIAGNOSIS — J4540 Moderate persistent asthma, uncomplicated: Secondary | ICD-10-CM | POA: Diagnosis not present

## 2020-10-14 DIAGNOSIS — E7800 Pure hypercholesterolemia, unspecified: Secondary | ICD-10-CM | POA: Diagnosis not present

## 2020-10-14 DIAGNOSIS — I5032 Chronic diastolic (congestive) heart failure: Secondary | ICD-10-CM | POA: Diagnosis not present

## 2020-10-17 DIAGNOSIS — H2513 Age-related nuclear cataract, bilateral: Secondary | ICD-10-CM | POA: Diagnosis not present

## 2020-10-17 DIAGNOSIS — H524 Presbyopia: Secondary | ICD-10-CM | POA: Diagnosis not present

## 2020-10-17 DIAGNOSIS — E119 Type 2 diabetes mellitus without complications: Secondary | ICD-10-CM | POA: Diagnosis not present

## 2020-10-17 DIAGNOSIS — H349 Unspecified retinal vascular occlusion: Secondary | ICD-10-CM | POA: Diagnosis not present

## 2020-10-19 DIAGNOSIS — E039 Hypothyroidism, unspecified: Secondary | ICD-10-CM | POA: Diagnosis not present

## 2020-10-21 DIAGNOSIS — H8110 Benign paroxysmal vertigo, unspecified ear: Secondary | ICD-10-CM | POA: Diagnosis not present

## 2020-11-01 DIAGNOSIS — R69 Illness, unspecified: Secondary | ICD-10-CM | POA: Diagnosis not present

## 2020-11-06 ENCOUNTER — Encounter: Payer: Self-pay | Admitting: Cardiovascular Disease

## 2020-11-06 NOTE — Progress Notes (Signed)
[name] Date of Birth  1951-04-15 [facility]        [address]    [place]     [place], [place]  [address]      [phone]  Fax  [phone]       Problem list: 1. Hypertension 2. Diastolic congestive heart failure 3. Hypothyroid 4. Obesity  5. History of seizures  [name] is a 69 year old female who presented to our office last month with shortness of breath. We started her on Lasix and potassium.  Feels well preserved left ventricular systolic function. She does have mild left ventricular hypertrophy. Her ejection fraction is around 60%.  She is feeling quite a bit better on the Lasix.  A BMP drawn at the time of her echocardiogram reveals that her potassium is 3.8. Her other electrolytes are okay to  She has had some GI bleeding She was found to have some hemorrhoids injected.  July 17, 2012: Pt is doing well.  She has had some lightheadedness and was reduce her Lasix. She now takes a half a tablet Lasix every third or fourth day a week. Otherwise she seems to be feeling fairly well.  May 25, 2013:  She is still very short of breath with exertion.  She has continued to gain weight.  She eats lots of breath.    06/21/2014:  She continues to have problems with weight.   She has borderline diabetes.  No CP, no dyspnea  June 27, 2015:  Was recently diagnosed with possible  Graves disease  ( according to eye doctor )  Breathing has been ok. Does not walk regularly   Nov. 3, 2017:  No CP , has some DOE  Has some wheezing  Has gained some weight .   Has mild leg swelling   March 19, 2018: [name] is seen today for follow-up of her hypertension, chronic diastolic congestive heart failure and morbid obesity.  She was last seen in November, 2017.  Has DOE walking into the office. Has lost some weight.   10 lbs weight loss since Nov. 2018.    Has leg pain at night - no claudication pain with walking ( she asked about our PAD poster)  Primary MD  is managing her lipids   Aug. 19, 2020   Seen for follow of of chronic diastolic CHF.   BP is well controlled.  Is on a new med for her Graves disease.  Wt is 290 today  ( up 12 lbs from last )   Nov. 22, 2021 [name] is seen for follow up of her chronic diastolic CHF Wt. Today is 269. - down 21 lbs from last year  BP is well controlled.   Is on prednisone for her Graves eye disease  Takes furosemide and kdur about 3 times a week    Current Outpatient Medications on File Prior to Visit  Medication Sig Dispense Refill  . albuterol (PROAIR HFA) 108 (90 Base) MCG/ACT inhaler Inhale 2 puffs into the lungs every 6 (six) hours as needed for wheezing or shortness of breath.    Marland Kitchen amLODipine (NORVASC) 5 MG tablet Take 1 tablet (5 mg total) by mouth daily. 90 tablet 3  . Blood Glucose Monitoring Suppl (ONETOUCH VERIO REFLECT) w/Device KIT 1 each by Other route as directed.    . carvedilol (COREG) 25 MG tablet Take 1 tablet (25 mg total) by mouth 2 (two) times daily with a meal. 60 tablet 5  . Cholecalciferol (  VITAMIN D3) 125 MCG (5000 UT) CAPS Take 10,000 tablets by mouth daily.     . Cyanocobalamin (VITAMIN B-12 CR PO) Take 2 tablets by mouth daily. 1073mg    . esomeprazole (NEXIUM) 20 MG capsule Take 1 capsule by mouth daily.    . Fluticasone-Salmeterol (ADVAIR DISKUS) 250-50 MCG/DOSE AEPB Inhale 1 puff into the lungs daily as needed for allergies.    . furosemide (LASIX) 40 MG tablet Take 40 mg by mouth daily as needed for fluid or edema.    .Marland Kitchenglimepiride (AMARYL) 4 MG tablet Take 4 mg by mouth daily with breakfast.    . glucose blood test strip Use as instructed 100 each 12  . Lancets (ONETOUCH ULTRASOFT) lancets Use as instructed 100 each 12  . levothyroxine (SYNTHROID) 112 MCG tablet Take 1 tablet by mouth Monday through Saturday and 1/2 tablet on Sunday. 30 tablet 3  . meclizine (ANTIVERT) 25 MG tablet Take 1 tablet by mouth as needed.    . metFORMIN (GLUCOPHAGE) 500 MG tablet Take 500  mg by mouth 2 (two) times daily.    . predniSONE (DELTASONE) 10 MG tablet Take 10 mg by mouth as directed. Pt is tapering off, take as prescribed    . rosuvastatin (CRESTOR) 5 MG tablet Take 1 tablet by mouth once a week.      No current facility-administered medications on file prior to visit.    Allergies  Allergen Reactions  . Gluten Meal     Celiac    Past Medical History:  Diagnosis Date  . Anemia   . Celiac disease   . [place] ([place]ive heart failure) ([place]   . Depression   . Diabetes mellitus without complication ([place]   . Edema    lower extremities  . Emphysema of lung ([facility]   . Enlarged heart    begining stage  . Graves disease   . Headache(784.0) 05/18/2013  . Heart murmur   . Hematochezia   . Hemorrhoid   . Hyperlipidemia   . Hypertension   . Migraine   . Obese   . OSA (obstructive sleep apnea)   . Seizures ([place]   . Sleep apnea   . Stroke ([facility]    left P-O ICH 2007  . Thyroid disease    hypothyroidism  . Ulcer   . Vitamin D deficiency     Past Surgical History:  Procedure Laterality Date  . CESAREAN SECTION    . HEMORRHOID SURGERY    . IUD REMOVAL    . TONSILLECTOMY    . VEIN SURGERY      Social History   Tobacco Use  Smoking Status Never Smoker  Smokeless Tobacco Never Used    Social History   Substance and Sexual Activity  Alcohol Use No    Family History  Problem Relation Age of Onset  . Heart disease Father   . Diabetes Father   . Heart failure Father   . Diabetes Mother   . Thyroid disease Sister   . Diabetes Brother     Reviw of Systems:  Reviewed in the HPI.  All other systems are negative.  Physical Exam: Blood pressure 128/80, pulse 84, height _0  (1.575 m), weight 269 lb 9.6 oz (122.3 kg), SpO2 97 %.  GEN:  Well nourished, well developed in no acute distress HEENT: Normal NECK: No JVD; No carotid bruits LYMPHATICS: No lymphadenopathy CARDIAC: RRR , no murmurs, rubs, gallops RESPIRATORY:  Clear to auscultation  without rales, wheezing or rhonchi  ABDOMEN: Soft, non-tender, non-distended MUSCULOSKELETAL:  No edema; No deformity  SKIN: Warm and dry NEUROLOGIC:  Alert and oriented x 3  ECG: November 07, 2020: Normal sinus rhythm at 84.  No ST or T wave changes.  Assessment / Plan:   1. Hypertension-blood pressure remains well controlled.  I encouraged her to continue with diet, exercise, weight loss.   2. Diastolic congestive heart failure-     she is not having any symptoms of diastolic heart failure.  Blood pressure is well controlled.  She will continue to watch her salt.   3. Hypothyroid-she has Graves' disease.  Continue current therapies.  4. Obesity -    she is lost 21 pounds since I saw her last year.  I encouraged her to continue with weight loss.     Mertie Moores, MD  11/07/2020 10:55 AM    [address],  [place], [place]  [address] Pager [phone] Phone: [phone]; Fax: [phone]

## 2020-11-07 ENCOUNTER — Other Ambulatory Visit: Payer: Self-pay

## 2020-11-07 ENCOUNTER — Encounter: Payer: Self-pay | Admitting: Cardiovascular Disease

## 2020-11-07 ENCOUNTER — Ambulatory Visit: Payer: Medicare HMO | Admitting: Cardiovascular Disease

## 2020-11-07 VITALS — BP 128/80 | HR 84 | Ht 62.0 in | Wt 269.6 lb

## 2020-11-07 DIAGNOSIS — I5032 Chronic diastolic (congestive) heart failure: Secondary | ICD-10-CM

## 2020-11-07 DIAGNOSIS — I10 Essential (primary) hypertension: Secondary | ICD-10-CM

## 2020-11-07 DIAGNOSIS — E039 Hypothyroidism, unspecified: Secondary | ICD-10-CM | POA: Diagnosis not present

## 2020-11-07 DIAGNOSIS — E7800 Pure hypercholesterolemia, unspecified: Secondary | ICD-10-CM | POA: Diagnosis not present

## 2020-11-07 DIAGNOSIS — E785 Hyperlipidemia, unspecified: Secondary | ICD-10-CM

## 2020-11-07 DIAGNOSIS — E1169 Type 2 diabetes mellitus with other specified complication: Secondary | ICD-10-CM | POA: Diagnosis not present

## 2020-11-07 DIAGNOSIS — J4540 Moderate persistent asthma, uncomplicated: Secondary | ICD-10-CM | POA: Diagnosis not present

## 2020-11-07 DIAGNOSIS — E66813 Obesity, class 3: Secondary | ICD-10-CM | POA: Insufficient documentation

## 2020-11-07 DIAGNOSIS — J45909 Unspecified asthma, uncomplicated: Secondary | ICD-10-CM | POA: Diagnosis not present

## 2020-11-07 LAB — HEPATIC FUNCTION PANEL
ALT: 21 IU/L (ref 0–32)
AST: 21 IU/L (ref 0–40)
Albumin: 4.3 g/dL (ref 3.8–4.8)
Alkaline Phosphatase: 73 IU/L (ref 44–121)
Bilirubin Total: 0.6 mg/dL (ref 0.0–1.2)
Bilirubin, Direct: 0.2 mg/dL (ref 0.00–0.40)
Total Protein: 6.5 g/dL (ref 6.0–8.5)

## 2020-11-07 LAB — BASIC METABOLIC PANEL
BUN/Creatinine Ratio: 12 (ref 12–28)
BUN: 13 mg/dL (ref 8–27)
CO2: 24 mmol/L (ref 20–29)
Calcium: 9.9 mg/dL (ref 8.7–10.3)
Chloride: 103 mmol/L (ref 96–106)
Creatinine, Ser: 1.06 mg/dL — ABNORMAL HIGH (ref 0.57–1.00)
GFR calc Af Amer: 61 mL/min/{1.73_m2} (ref >59)
GFR calc non Af Amer: 53 mL/min/{1.73_m2} — ABNORMAL LOW (ref >59)
Glucose: 118 mg/dL — ABNORMAL HIGH (ref 65–99)
Potassium: 4.1 mmol/L (ref 3.5–5.2)
Sodium: 141 mmol/L (ref 134–144)

## 2020-11-07 LAB — LIPID PANEL
Chol/HDL Ratio: 2.7 ratio (ref 0.0–4.4)
Cholesterol, Total: 231 mg/dL — ABNORMAL HIGH (ref 100–199)
HDL: 87 mg/dL (ref >39)
LDL Chol Calc (NIH): 132 mg/dL — ABNORMAL HIGH (ref 0–99)
Triglycerides: 69 mg/dL (ref 0–149)
VLDL Cholesterol Cal: 12 mg/dL (ref 5–40)

## 2020-11-07 MED ORDER — POTASSIUM CHLORIDE ER 10 MEQ PO TBCR: 20.0000 meq | EXTENDED_RELEASE_TABLET | Freq: Every day | ORAL | 3 refills | Status: DC

## 2020-11-07 NOTE — Patient Instructions (Signed)
Medication Instructions:  Your physician recommends that you continue on your current medications as directed. Please refer to the Current Medication list given to you today.  *If you need a refill on your cardiac medications before your next appointment, please call your pharmacy*   Lab Work: TODAY - cholesterol, liver panel, basic metabolic panel If you have labs (blood work) drawn today and your tests are completely normal, you will receive your results only by: MyChart Message (if you have MyChart) OR A paper copy in the mail If you have any lab test that is abnormal or we need to change your treatment, we will call you to review the results.   Testing/Procedures: None Ordered   Follow-Up: At [facility], you and your health needs are our priority.  As part of our continuing mission to provide you with exceptional heart care, we have created designated Provider Care Teams.  These Care Teams include your primary Cardiologist (physician) and Advanced Practice Providers (APPs -  Physician Assistants and Nurse Practitioners) who all work together to provide you with the care you need, when you need it.  Your next appointment:   1 year(s)  The format for your next appointment:   In Person  Provider:   You may see Philip Nahser, MD or one of the following Advanced Practice Providers on your designated Care Team:   Scott Weaver, PA-C Vin Bhagat, PA-C   

## 2020-11-08 ENCOUNTER — Other Ambulatory Visit: Payer: Self-pay

## 2020-11-08 ENCOUNTER — Other Ambulatory Visit: Payer: Self-pay | Admitting: *Deleted

## 2020-11-08 ENCOUNTER — Telehealth: Payer: Self-pay

## 2020-11-08 DIAGNOSIS — E785 Hyperlipidemia, unspecified: Secondary | ICD-10-CM

## 2020-11-08 DIAGNOSIS — Z79899 Other long term (current) drug therapy: Secondary | ICD-10-CM

## 2020-11-08 DIAGNOSIS — I10 Essential (primary) hypertension: Secondary | ICD-10-CM

## 2020-11-08 DIAGNOSIS — I5032 Chronic diastolic (congestive) heart failure: Secondary | ICD-10-CM

## 2020-11-08 MED ORDER — LEVOTHYROXINE SODIUM 112 MCG PO TABS: ORAL_TABLET | ORAL | 3 refills | Status: AC

## 2020-11-08 MED ORDER — FUROSEMIDE 40 MG PO TABS: 40.0000 mg | ORAL_TABLET | Freq: Every day | ORAL | 3 refills | Status: DC | PRN

## 2020-11-08 MED ORDER — ROSUVASTATIN CALCIUM 5 MG PO TABS
5.0000 mg | ORAL_TABLET | Freq: Every day | ORAL | 3 refills | Status: DC
Start: 2020-11-08 — End: 2021-12-19

## 2020-11-08 NOTE — Telephone Encounter (Signed)
Pt verbalized understanding of her lab results and agrees to try taking the Rosuvastatin daily and will return in Feb 2022 for repeat fasting labs.

## 2020-11-08 NOTE — Telephone Encounter (Signed)
Ok to fill 

## 2020-11-08 NOTE — Telephone Encounter (Signed)
-----   Message from Vesta Mixer, MD sent at 11/07/2020  7:09 PM EST -----  Chol and LDL are elevated.  She is on rosuvastatin 5 mg a week Can she tolerate rosuvastatin 5 mg daily Check lipids, liver enz, and bmp in 3 months  Glucose is mildly elevated - no significant changes from previous levels

## 2020-11-08 NOTE — Telephone Encounter (Signed)
[facility] is requesting a refill on furosemide 40 mg tablet. This medication has not been refilled since 2016. Would Dr. Elease Hashimoto like to refill this medication? Please address

## 2020-11-18 DIAGNOSIS — R059 Cough, unspecified: Secondary | ICD-10-CM | POA: Diagnosis not present

## 2020-11-18 DIAGNOSIS — Z03818 Encounter for observation for suspected exposure to other biological agents ruled out: Secondary | ICD-10-CM | POA: Diagnosis not present

## 2020-11-30 DIAGNOSIS — E05 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: Secondary | ICD-10-CM | POA: Diagnosis not present

## 2020-12-26 DIAGNOSIS — I509 Heart failure, unspecified: Secondary | ICD-10-CM | POA: Diagnosis not present

## 2020-12-26 DIAGNOSIS — I110 Hypertensive heart disease with heart failure: Secondary | ICD-10-CM | POA: Diagnosis not present

## 2020-12-26 DIAGNOSIS — E1159 Type 2 diabetes mellitus with other circulatory complications: Secondary | ICD-10-CM | POA: Diagnosis not present

## 2020-12-26 DIAGNOSIS — I4891 Unspecified atrial fibrillation: Secondary | ICD-10-CM | POA: Diagnosis not present

## 2020-12-26 DIAGNOSIS — Z008 Encounter for other general examination: Secondary | ICD-10-CM | POA: Diagnosis not present

## 2020-12-26 DIAGNOSIS — J439 Emphysema, unspecified: Secondary | ICD-10-CM | POA: Diagnosis not present

## 2020-12-26 DIAGNOSIS — Z6841 Body Mass Index (BMI) 40.0 and over, adult: Secondary | ICD-10-CM | POA: Diagnosis not present

## 2020-12-26 DIAGNOSIS — E1151 Type 2 diabetes mellitus with diabetic peripheral angiopathy without gangrene: Secondary | ICD-10-CM | POA: Diagnosis not present

## 2020-12-26 DIAGNOSIS — E039 Hypothyroidism, unspecified: Secondary | ICD-10-CM | POA: Diagnosis not present

## 2020-12-26 DIAGNOSIS — E785 Hyperlipidemia, unspecified: Secondary | ICD-10-CM | POA: Diagnosis not present

## 2021-01-03 DIAGNOSIS — H468 Other optic neuritis: Secondary | ICD-10-CM | POA: Diagnosis not present

## 2021-01-03 DIAGNOSIS — J342 Deviated nasal septum: Secondary | ICD-10-CM | POA: Diagnosis not present

## 2021-01-03 DIAGNOSIS — E05 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: Secondary | ICD-10-CM | POA: Diagnosis not present

## 2021-01-04 DIAGNOSIS — E05 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: Secondary | ICD-10-CM | POA: Diagnosis not present

## 2021-01-23 DIAGNOSIS — Z6841 Body Mass Index (BMI) 40.0 and over, adult: Secondary | ICD-10-CM | POA: Diagnosis not present

## 2021-01-23 DIAGNOSIS — I10 Essential (primary) hypertension: Secondary | ICD-10-CM | POA: Diagnosis not present

## 2021-01-23 DIAGNOSIS — E1169 Type 2 diabetes mellitus with other specified complication: Secondary | ICD-10-CM | POA: Diagnosis not present

## 2021-01-23 DIAGNOSIS — Z01810 Encounter for preprocedural cardiovascular examination: Secondary | ICD-10-CM | POA: Diagnosis not present

## 2021-01-23 DIAGNOSIS — E039 Hypothyroidism, unspecified: Secondary | ICD-10-CM | POA: Diagnosis not present

## 2021-01-23 DIAGNOSIS — Z01811 Encounter for preprocedural respiratory examination: Secondary | ICD-10-CM | POA: Diagnosis not present

## 2021-01-23 DIAGNOSIS — E05 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: Secondary | ICD-10-CM | POA: Diagnosis not present

## 2021-01-23 DIAGNOSIS — Z1211 Encounter for screening for malignant neoplasm of colon: Secondary | ICD-10-CM | POA: Diagnosis not present

## 2021-01-23 DIAGNOSIS — E7800 Pure hypercholesterolemia, unspecified: Secondary | ICD-10-CM | POA: Diagnosis not present

## 2021-01-30 DIAGNOSIS — Z20822 Contact with and (suspected) exposure to covid-19: Secondary | ICD-10-CM | POA: Diagnosis not present

## 2021-01-30 DIAGNOSIS — E05 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: Secondary | ICD-10-CM | POA: Diagnosis not present

## 2021-01-30 DIAGNOSIS — Z01818 Encounter for other preprocedural examination: Secondary | ICD-10-CM | POA: Diagnosis not present

## 2021-01-30 DIAGNOSIS — Z01812 Encounter for preprocedural laboratory examination: Secondary | ICD-10-CM | POA: Diagnosis not present

## 2021-01-31 ENCOUNTER — Other Ambulatory Visit: Payer: Medicare HMO

## 2021-02-01 ENCOUNTER — Other Ambulatory Visit: Payer: Medicare HMO

## 2021-02-01 DIAGNOSIS — G4733 Obstructive sleep apnea (adult) (pediatric): Secondary | ICD-10-CM | POA: Diagnosis not present

## 2021-02-01 DIAGNOSIS — K219 Gastro-esophageal reflux disease without esophagitis: Secondary | ICD-10-CM | POA: Diagnosis not present

## 2021-02-01 DIAGNOSIS — J3489 Other specified disorders of nose and nasal sinuses: Secondary | ICD-10-CM | POA: Diagnosis not present

## 2021-02-01 DIAGNOSIS — Z7951 Long term (current) use of inhaled steroids: Secondary | ICD-10-CM | POA: Diagnosis not present

## 2021-02-01 DIAGNOSIS — H468 Other optic neuritis: Secondary | ICD-10-CM | POA: Diagnosis not present

## 2021-02-01 DIAGNOSIS — Z7952 Long term (current) use of systemic steroids: Secondary | ICD-10-CM | POA: Diagnosis not present

## 2021-02-01 DIAGNOSIS — J349 Unspecified disorder of nose and nasal sinuses: Secondary | ICD-10-CM | POA: Diagnosis not present

## 2021-02-01 DIAGNOSIS — Z7984 Long term (current) use of oral hypoglycemic drugs: Secondary | ICD-10-CM | POA: Diagnosis not present

## 2021-02-03 DIAGNOSIS — I129 Hypertensive chronic kidney disease with stage 1 through stage 4 chronic kidney disease, or unspecified chronic kidney disease: Secondary | ICD-10-CM | POA: Diagnosis not present

## 2021-02-03 DIAGNOSIS — I5032 Chronic diastolic (congestive) heart failure: Secondary | ICD-10-CM | POA: Diagnosis not present

## 2021-02-03 DIAGNOSIS — J4540 Moderate persistent asthma, uncomplicated: Secondary | ICD-10-CM | POA: Diagnosis not present

## 2021-02-03 DIAGNOSIS — E7800 Pure hypercholesterolemia, unspecified: Secondary | ICD-10-CM | POA: Diagnosis not present

## 2021-02-03 DIAGNOSIS — M170 Bilateral primary osteoarthritis of knee: Secondary | ICD-10-CM | POA: Diagnosis not present

## 2021-02-03 DIAGNOSIS — E039 Hypothyroidism, unspecified: Secondary | ICD-10-CM | POA: Diagnosis not present

## 2021-02-03 DIAGNOSIS — E1122 Type 2 diabetes mellitus with diabetic chronic kidney disease: Secondary | ICD-10-CM | POA: Diagnosis not present

## 2021-02-03 DIAGNOSIS — N1831 Chronic kidney disease, stage 3a: Secondary | ICD-10-CM | POA: Diagnosis not present

## 2021-02-06 DIAGNOSIS — J342 Deviated nasal septum: Secondary | ICD-10-CM | POA: Diagnosis not present

## 2021-02-07 ENCOUNTER — Ambulatory Visit: Payer: Medicare HMO | Admitting: Endocrinology

## 2021-02-09 DIAGNOSIS — E05 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: Secondary | ICD-10-CM | POA: Diagnosis not present

## 2021-02-14 DIAGNOSIS — J342 Deviated nasal septum: Secondary | ICD-10-CM | POA: Diagnosis not present

## 2021-02-14 DIAGNOSIS — E05 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: Secondary | ICD-10-CM | POA: Diagnosis not present

## 2021-02-14 DIAGNOSIS — H468 Other optic neuritis: Secondary | ICD-10-CM | POA: Diagnosis not present

## 2021-02-18 ENCOUNTER — Other Ambulatory Visit: Payer: Self-pay | Admitting: Endocrinology

## 2021-02-18 DIAGNOSIS — E1165 Type 2 diabetes mellitus with hyperglycemia: Secondary | ICD-10-CM

## 2021-03-07 DIAGNOSIS — Z1211 Encounter for screening for malignant neoplasm of colon: Secondary | ICD-10-CM | POA: Diagnosis not present

## 2021-03-07 DIAGNOSIS — K219 Gastro-esophageal reflux disease without esophagitis: Secondary | ICD-10-CM | POA: Diagnosis not present

## 2021-03-15 DIAGNOSIS — E039 Hypothyroidism, unspecified: Secondary | ICD-10-CM | POA: Diagnosis not present

## 2021-03-15 DIAGNOSIS — Z1211 Encounter for screening for malignant neoplasm of colon: Secondary | ICD-10-CM | POA: Diagnosis not present

## 2021-03-15 DIAGNOSIS — Z1212 Encounter for screening for malignant neoplasm of rectum: Secondary | ICD-10-CM | POA: Diagnosis not present

## 2021-03-20 LAB — COLOGUARD: COLOGUARD: NEGATIVE

## 2021-03-26 ENCOUNTER — Other Ambulatory Visit: Payer: Self-pay

## 2021-03-26 ENCOUNTER — Emergency Department (HOSPITAL_COMMUNITY)
Admission: EM | Admit: 2021-03-26 | Discharge: 2021-03-26 | Disposition: A | Discharge status: Home / Self Care | Payer: Medicare HMO | Attending: Emergency Medicine | Admitting: Emergency Medicine

## 2021-03-26 ENCOUNTER — Emergency Department (HOSPITAL_COMMUNITY): Payer: Medicare HMO

## 2021-03-26 ENCOUNTER — Encounter (HOSPITAL_COMMUNITY): Payer: Self-pay | Admitting: Emergency Medicine

## 2021-03-26 DIAGNOSIS — Z7984 Long term (current) use of oral hypoglycemic drugs: Secondary | ICD-10-CM | POA: Insufficient documentation

## 2021-03-26 DIAGNOSIS — E119 Type 2 diabetes mellitus without complications: Secondary | ICD-10-CM | POA: Diagnosis not present

## 2021-03-26 DIAGNOSIS — I5032 Chronic diastolic (congestive) heart failure: Secondary | ICD-10-CM | POA: Diagnosis not present

## 2021-03-26 DIAGNOSIS — M791 Myalgia, unspecified site: Secondary | ICD-10-CM | POA: Diagnosis not present

## 2021-03-26 DIAGNOSIS — E039 Hypothyroidism, unspecified: Secondary | ICD-10-CM | POA: Insufficient documentation

## 2021-03-26 DIAGNOSIS — M62838 Other muscle spasm: Secondary | ICD-10-CM

## 2021-03-26 DIAGNOSIS — M542 Cervicalgia: Secondary | ICD-10-CM | POA: Insufficient documentation

## 2021-03-26 DIAGNOSIS — M5030 Other cervical disc degeneration, unspecified cervical region: Secondary | ICD-10-CM

## 2021-03-26 DIAGNOSIS — Z79899 Other long term (current) drug therapy: Secondary | ICD-10-CM | POA: Insufficient documentation

## 2021-03-26 DIAGNOSIS — I110 Hypertensive heart disease with heart failure: Secondary | ICD-10-CM | POA: Insufficient documentation

## 2021-03-26 DIAGNOSIS — Z794 Long term (current) use of insulin: Secondary | ICD-10-CM | POA: Insufficient documentation

## 2021-03-26 DIAGNOSIS — M5033 Other cervical disc degeneration, cervicothoracic region: Secondary | ICD-10-CM | POA: Diagnosis not present

## 2021-03-26 LAB — CBC WITH DIFFERENTIAL/PLATELET
Abs Immature Granulocytes: 0.17 10*3/uL — ABNORMAL HIGH (ref 0.00–0.07)
Basophils Absolute: 0 10*3/uL (ref 0.0–0.1)
Basophils Relative: 0 %
Eosinophils Absolute: 0 10*3/uL (ref 0.0–0.5)
Eosinophils Relative: 0 %
HCT: 38.9 % (ref 36.0–46.0)
Hemoglobin: 12.4 g/dL (ref 12.0–15.0)
Immature Granulocytes: 2 %
Lymphocytes Relative: 4 %
Lymphs Abs: 0.4 10*3/uL — ABNORMAL LOW (ref 0.7–4.0)
MCH: 30.4 pg (ref 26.0–34.0)
MCHC: 31.9 g/dL (ref 30.0–36.0)
MCV: 95.3 fL (ref 80.0–100.0)
Monocytes Absolute: 0.4 10*3/uL (ref 0.1–1.0)
Monocytes Relative: 4 %
Neutro Abs: 10 10*3/uL — ABNORMAL HIGH (ref 1.7–7.7)
Neutrophils Relative %: 90 %
Platelets: 177 10*3/uL (ref 150–400)
RBC: 4.08 MIL/uL (ref 3.87–5.11)
RDW: 15.6 % — ABNORMAL HIGH (ref 11.5–15.5)
WBC: 11 10*3/uL — ABNORMAL HIGH (ref 4.0–10.5)
nRBC: 0 % (ref 0.0–0.2)

## 2021-03-26 LAB — BASIC METABOLIC PANEL
Anion gap: 8 (ref 5–15)
BUN: 19 mg/dL (ref 8–23)
CO2: 29 mmol/L (ref 22–32)
Calcium: 9.4 mg/dL (ref 8.9–10.3)
Chloride: 100 mmol/L (ref 98–111)
Creatinine, Ser: 1.13 mg/dL — ABNORMAL HIGH (ref 0.44–1.00)
GFR, Estimated: 52 mL/min — ABNORMAL LOW (ref >60)
Glucose, Bld: 207 mg/dL — ABNORMAL HIGH (ref 70–99)
Potassium: 4.3 mmol/L (ref 3.5–5.1)
Sodium: 137 mmol/L (ref 135–145)

## 2021-03-26 MED ORDER — METHOCARBAMOL 750 MG PO TABS: 750.0000 mg | ORAL_TABLET | Freq: Three times a day (TID) | ORAL | 0 refills | Status: DC | PRN

## 2021-03-26 NOTE — ED Triage Notes (Signed)
Pt reports muscle spasms in arms and legs since January.  States she is having difficulty reaching up to put milk in fridge.  Sharp pains in neck x 3 days.  Denies pain at present.

## 2021-03-26 NOTE — ED Provider Notes (Signed)
[place] EMERGENCY DEPARTMENT Provider Note   CSN: [id number] Arrival date & time: 03/26/21  1831     History Chief Complaint  Patient presents with  . Spasms    Cyrstal Leitz is a 70 y.o. female.  Patient c/o diffuse body muscle aches, and occasional spasm sensation since January. Symptoms gradual onset, constant, persistent, dull, mild-moderate, without specific exacerbating or alleviating factors. No acute or abrupt worsening today or this week. No fever or chills. No focal or specific area of spasm, but states at times seems more so in bilateral arms.  States occasionally will get sharp pain in neck w certain movements, occasionally radiating towards shoulder. Is eating/drinking normally. No acute wt change. No headaches. No change in speech or vision. No numbness/weakness or problems w balance or coordination.   The history is provided by the patient.       Past Medical History:  Diagnosis Date  . Anemia   . Celiac disease   . CHF (congestive heart failure) ([place])   . Depression   . Diabetes mellitus without complication ([place])   . Edema    lower extremities  . Emphysema of lung ([place])   . Enlarged heart    begining stage  . Graves disease   . Headache(784.0) 05/18/2013  . Heart murmur   . Hematochezia   . Hemorrhoid   . Hyperlipidemia   . Hypertension   . Migraine   . Obese   . OSA (obstructive sleep apnea)   . Seizures ([place])   . Sleep apnea   . Stroke [facility])    left P-O ICH 2007  . Thyroid disease    hypothyroidism  . Ulcer   . Vitamin D deficiency     Patient Active Problem List   Diagnosis Date Noted  . Morbid obesity ([place]) 11/07/2020  . Diastolic dysfunction 62/94/7654  . Headache(784.0) 05/18/2013  . Chronic diastolic CHF (congestive heart failure) ([place]) 12/04/2011  . Hemorrhoids, internal, with bleeding 11/28/2011    Past Surgical History:  Procedure Laterality Date  . CESAREAN SECTION    . HEMORRHOID SURGERY    . IUD  REMOVAL    . TONSILLECTOMY    . VEIN SURGERY       OB History   No obstetric history on file.     Family History  Problem Relation Age of Onset  . Heart disease Father   . Diabetes Father   . Heart failure Father   . Diabetes Mother   . Thyroid disease Sister   . Diabetes Brother     Social History   Tobacco Use  . Smoking status: Never Smoker  . Smokeless tobacco: Never Used  Vaping Use  . Vaping Use: Never used  Substance Use Topics  . Alcohol use: No  . Drug use: No    Home Medications Prior to Admission medications   Medication Sig Start Date End Date Taking? Authorizing Provider  albuterol (PROAIR HFA) 108 (90 Base) MCG/ACT inhaler Inhale 2 puffs into the lungs every 6 (six) hours as needed for wheezing or shortness of breath.    [provider]  amLODipine (NORVASC) 5 MG tablet Take 1 tablet (5 mg total) by mouth daily. 10/04/20   Nahser, Wonda Cheng, MD  Blood Glucose Monitoring Suppl [facility] VERIO REFLECT) w/Device KIT 1 each by Other route as directed. 08/10/20   [provider]  carvedilol (COREG) 25 MG tablet Take 1 tablet (25 mg total) by mouth 2 (two) times daily with  a meal. 12/05/12   Nahser, Wonda Cheng, MD  Cholecalciferol (VITAMIN D3) 125 MCG (5000 UT) CAPS Take 10,000 tablets by mouth daily.     [provider]  Cyanocobalamin (VITAMIN B-12 CR PO) Take 2 tablets by mouth daily. 10104mg    [provider]  esomeprazole (NEXIUM) 20 MG capsule Take 1 capsule by mouth daily.    [provider]  Fluticasone-Salmeterol (ADVAIR DISKUS) 250-50 MCG/DOSE AEPB Inhale 1 puff into the lungs daily as needed for allergies.    [provider]  furosemide (LASIX) 40 MG tablet Take 1 tablet (40 mg total) by mouth daily as needed for fluid or edema. 11/08/20   Nahser, PWonda Cheng MD  glimepiride (AMARYL) 4 MG tablet Take 4 mg by mouth daily with breakfast.    [provider]  glucose blood (ONETOUCH VERIO) test strip  Use as intstructed to check blood sugar once a day Dx Code E11.9 02/18/21   KElayne Snare MD  Lancets ([facility]) lancets Use as instructed 10/13/20   KElayne Snare MD  levothyroxine (SYNTHROID) 112 MCG tablet Take 1 tablet by mouth Monday through Saturday and 1/2 tablet on Sunday. 11/08/20   KElayne Snare MD  meclizine (ANTIVERT) 25 MG tablet Take 1 tablet by mouth as needed.    [provider]  metFORMIN (GLUCOPHAGE) 500 MG tablet Take 500 mg by mouth 2 (two) times daily. 08/23/20   [provider]  potassium chloride (KLOR-CON) 10 MEQ tablet Take 2 tablets (20 mEq total) by mouth daily. 11/07/20   Nahser, PWonda Cheng MD  predniSONE (DELTASONE) 10 MG tablet Take 10 mg by mouth as directed. Pt is tapering off, take as prescribed 10/12/20   [provider]  rosuvastatin (CRESTOR) 5 MG tablet Take 1 tablet (5 mg total) by mouth daily. 11/08/20   Nahser, PWonda Cheng MD    Allergies    Gluten meal  Review of Systems   Review of Systems  Constitutional: Negative for fever.  HENT: Negative for sore throat and trouble swallowing.   Eyes: Negative for visual disturbance.  Respiratory: Negative for cough and shortness of breath.   Cardiovascular: Negative for chest pain and leg swelling.  Gastrointestinal: Negative for abdominal pain, diarrhea and vomiting.  Genitourinary: Negative for dysuria and flank pain.  Musculoskeletal: Positive for myalgias. Negative for back pain.  Skin: Negative for rash.  Neurological: Negative for speech difficulty, weakness, numbness and headaches.  Hematological: Does not bruise/bleed easily.  Psychiatric/Behavioral: Negative for confusion.    Physical Exam Updated Vital Signs BP 134/80   Pulse 83   Temp 98.6 F (37 C) (Oral)   Resp 20   SpO2 98%   Physical Exam Vitals and nursing note reviewed.  Constitutional:      Appearance: Normal appearance. She is well-developed.  HENT:     Head: Atraumatic.     Nose: Nose normal.      Mouth/Throat:     Mouth: Mucous membranes are moist.  Eyes:     General: No scleral icterus.    Conjunctiva/sclera: Conjunctivae normal.     Pupils: Pupils are equal, round, and reactive to light.  Neck:     Vascular: No carotid bruit.     Trachea: No tracheal deviation.  Cardiovascular:     Rate and Rhythm: Normal rate and regular rhythm.     Pulses: Normal pulses.     Heart sounds: Normal heart sounds. No murmur heard. No friction rub. No gallop.   Pulmonary:  Effort: Pulmonary effort is normal. No respiratory distress.     Breath sounds: Normal breath sounds.  Abdominal:     General: Bowel sounds are normal. There is no distension.     Palpations: Abdomen is soft.     Tenderness: There is no abdominal tenderness. There is no guarding.  Genitourinary:    Comments: No cva tenderness.  Musculoskeletal:        General: No swelling or tenderness.     Cervical back: Normal range of motion and neck supple. No rigidity. No muscular tenderness.     Comments: CTLS spine, non tender, aligned, no step off.   Skin:    General: Skin is warm and dry.     Findings: No rash.  Neurological:     Mental Status: She is alert.     Comments: Alert, speech normal. Motor/sens grossly intact bil. Steady gait.   Psychiatric:        Mood and Affect: Mood normal.     ED Results / Procedures / Treatments   Labs (all labs ordered are listed, but only abnormal results are displayed) Results for orders placed or performed during the hospital encounter of 03/26/21  CBC with Differential  Result Value Ref Range   WBC 11.0 (H) 4.0 - 10.5 K/uL   RBC 4.08 3.87 - 5.11 MIL/uL   Hemoglobin 12.4 12.0 - 15.0 g/dL   HCT 38.9 36.0 - 46.0 %   MCV 95.3 80.0 - 100.0 fL   MCH 30.4 26.0 - 34.0 pg   MCHC 31.9 30.0 - 36.0 g/dL   RDW 15.6 (H) 11.5 - 15.5 %   Platelets 177 150 - 400 K/uL   nRBC 0.0 0.0 - 0.2 %   Neutrophils Relative % 90 %   Neutro Abs 10.0 (H) 1.7 - 7.7 K/uL   Lymphocytes Relative 4 %    Lymphs Abs 0.4 (L) 0.7 - 4.0 K/uL   Monocytes Relative 4 %   Monocytes Absolute 0.4 0.1 - 1.0 K/uL   Eosinophils Relative 0 %   Eosinophils Absolute 0.0 0.0 - 0.5 K/uL   Basophils Relative 0 %   Basophils Absolute 0.0 0.0 - 0.1 K/uL   Immature Granulocytes 2 %   Abs Immature Granulocytes 0.17 (H) 0.00 - 0.07 K/uL  Basic metabolic panel  Result Value Ref Range   Sodium 137 135 - 145 mmol/L   Potassium 4.3 3.5 - 5.1 mmol/L   Chloride 100 98 - 111 mmol/L   CO2 29 22 - 32 mmol/L   Glucose, Bld 207 (H) 70 - 99 mg/dL   BUN 19 8 - 23 mg/dL   Creatinine, Ser 1.13 (H) 0.44 - 1.00 mg/dL   Calcium 9.4 8.9 - 10.3 mg/dL   GFR, Estimated 52 (L) >60 mL/min   Anion gap 8 5 - 15   CT Cervical Spine Wo Contrast  Result Date: 03/26/2021 CLINICAL DATA:  Right-sided neck pain with muscle spasm EXAM: CT CERVICAL SPINE WITHOUT CONTRAST TECHNIQUE: Multidetector CT imaging of the cervical spine was performed without intravenous contrast. Multiplanar CT image reconstructions were also generated. COMPARISON:  None. FINDINGS: Alignment: Reversal of cervical lordosis. No subluxation. Facet alignment is maintained Skull base and vertebrae: No acute fracture. No primary bone lesion or focal pathologic process. Soft tissues and spinal canal: No prevertebral fluid or swelling. No visible canal hematoma. Disc levels: At C2-C3, maintained disc space. No canal stenosis. Foramen are patent bilaterally. At C3-C4, maintained disc space. No canal stenosis. Tiny central disc protrusion. No  high-grade foraminal stenosis. At C4-C5, mild space narrowing. No significant canal stenosis. No high-grade foraminal narrowing. At C5-C6, moderate to marked disc space narrowing. No significant canal stenosis. Facet degenerative changes. Mild bilateral foraminal narrowing. At C6-C7, moderate disc space narrowing. No significant canal stenosis. Suspected central disc protrusion. No high-grade foraminal narrowing. Facet degenerative changes. At  C7-T1, no significant canal stenosis. No significant foraminal narrowing. Upper chest: Negative. Other: None IMPRESSION: Reversal of cervical lordosis with degenerative changes most notable at C5-C6 and C6-C7. No acute osseous abnormality. Electronically Signed   By: Donavan Foil M.D.   On: 03/26/2021 21:17    EKG None  Radiology CT Cervical Spine Wo Contrast  Result Date: 03/26/2021 CLINICAL DATA:  Right-sided neck pain with muscle spasm EXAM: CT CERVICAL SPINE WITHOUT CONTRAST TECHNIQUE: Multidetector CT imaging of the cervical spine was performed without intravenous contrast. Multiplanar CT image reconstructions were also generated. COMPARISON:  None. FINDINGS: Alignment: Reversal of cervical lordosis. No subluxation. Facet alignment is maintained Skull base and vertebrae: No acute fracture. No primary bone lesion or focal pathologic process. Soft tissues and spinal canal: No prevertebral fluid or swelling. No visible canal hematoma. Disc levels: At C2-C3, maintained disc space. No canal stenosis. Foramen are patent bilaterally. At C3-C4, maintained disc space. No canal stenosis. Tiny central disc protrusion. No high-grade foraminal stenosis. At C4-C5, mild space narrowing. No significant canal stenosis. No high-grade foraminal narrowing. At C5-C6, moderate to marked disc space narrowing. No significant canal stenosis. Facet degenerative changes. Mild bilateral foraminal narrowing. At C6-C7, moderate disc space narrowing. No significant canal stenosis. Suspected central disc protrusion. No high-grade foraminal narrowing. Facet degenerative changes. At C7-T1, no significant canal stenosis. No significant foraminal narrowing. Upper chest: Negative. Other: None IMPRESSION: Reversal of cervical lordosis with degenerative changes most notable at C5-C6 and C6-C7. No acute osseous abnormality. Electronically Signed   By: Donavan Foil M.D.   On: 03/26/2021 21:17    Procedures Procedures   Medications  Ordered in ED Medications - No data to display  ED Course  I have reviewed the triage vital signs and the nursing notes.  Pertinent labs & imaging results that were available during my care of the patient were reviewed by me and considered in my medical decision making (see chart for details).    MDM Rules/Calculators/A&P                         Labs sent.  Reviewed nursing notes and prior charts for additional history.   Recent visit with her doctor - note made then of patient not taking her meds regularly including thyroid med - tsh elev then, and pt instructed on need to take thyroid medication every day/compliance stressed - with plan to recheck in ~ 3 weeks from now.   Labs reviewed/interpreted by me - chem normal.   CT reviewed/interpreted by me - degen changes, neg acute.   Acetaminophen po.  No weakness on exam. No current spasms.   Pt appears stable for d/c. Rx for home.   Rec pcp f/u.    Final Clinical Impression(s) / ED Diagnoses Final diagnoses:  None    Rx / DC Orders ED Discharge Orders    None       Lajean Saver, MD 03/26/21 2125

## 2021-03-26 NOTE — Discharge Instructions (Addendum)
It was our pleasure to provide your ER care today - we hope that you feel better.  Overall, your lab tests look good - your blood sugar is mildly high (207) - drink adequate fluids, continue your diabetes meds, follow diabetes eating plan, monitor sugars, and follow up with primary care doctor in the next couple weeks.   Your ct scan shows degenerative changes/degenerative disc - take acetaminophen or ibuprofen as need for pain.  You may also take robaxin as need for muscle pain/spasm - no driving when taking.   Follow up with your primary care doctor in 1-2 weeks, have them review your imaging tests then.  Continue to take your meds/thyroid meds as prescribed. Follow up with your doctor in three weeks for recheck of your thyroid tests.   Return to ER if worse, new symptoms, fevers, severe/intractable pain, numbness/weakness, or other concern.

## 2021-03-26 NOTE — ED Notes (Signed)
Patient verbalized understanding of discharge instructions. Opportunity for questions and answers.  

## 2021-04-06 DIAGNOSIS — E039 Hypothyroidism, unspecified: Secondary | ICD-10-CM | POA: Diagnosis not present

## 2021-04-06 DIAGNOSIS — G4719 Other hypersomnia: Secondary | ICD-10-CM | POA: Diagnosis not present

## 2021-04-06 DIAGNOSIS — K219 Gastro-esophageal reflux disease without esophagitis: Secondary | ICD-10-CM | POA: Diagnosis not present

## 2021-04-06 DIAGNOSIS — M7989 Other specified soft tissue disorders: Secondary | ICD-10-CM | POA: Diagnosis not present

## 2021-04-06 DIAGNOSIS — I10 Essential (primary) hypertension: Secondary | ICD-10-CM | POA: Diagnosis not present

## 2021-04-06 DIAGNOSIS — M791 Myalgia, unspecified site: Secondary | ICD-10-CM | POA: Diagnosis not present

## 2021-04-06 DIAGNOSIS — Z789 Other specified health status: Secondary | ICD-10-CM | POA: Diagnosis not present

## 2021-04-06 DIAGNOSIS — E05 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: Secondary | ICD-10-CM | POA: Diagnosis not present

## 2021-04-06 DIAGNOSIS — E1122 Type 2 diabetes mellitus with diabetic chronic kidney disease: Secondary | ICD-10-CM | POA: Diagnosis not present

## 2021-04-08 DIAGNOSIS — E05 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: Secondary | ICD-10-CM | POA: Diagnosis not present

## 2021-04-10 DIAGNOSIS — Z8679 Personal history of other diseases of the circulatory system: Secondary | ICD-10-CM | POA: Diagnosis not present

## 2021-04-10 DIAGNOSIS — H35033 Hypertensive retinopathy, bilateral: Secondary | ICD-10-CM | POA: Diagnosis not present

## 2021-04-11 DIAGNOSIS — E05 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: Secondary | ICD-10-CM | POA: Diagnosis not present

## 2021-04-22 ENCOUNTER — Other Ambulatory Visit: Payer: Self-pay

## 2021-04-22 ENCOUNTER — Emergency Department (HOSPITAL_COMMUNITY)
Admission: EM | Admit: 2021-04-22 | Discharge: 2021-04-22 | Disposition: A | Discharge status: Home / Self Care | Payer: Medicare HMO | Attending: Emergency Medicine | Admitting: Emergency Medicine

## 2021-04-22 ENCOUNTER — Encounter (HOSPITAL_COMMUNITY): Payer: Self-pay

## 2021-04-22 DIAGNOSIS — X58XXXA Exposure to other specified factors, initial encounter: Secondary | ICD-10-CM | POA: Insufficient documentation

## 2021-04-22 DIAGNOSIS — I5032 Chronic diastolic (congestive) heart failure: Secondary | ICD-10-CM | POA: Insufficient documentation

## 2021-04-22 DIAGNOSIS — E119 Type 2 diabetes mellitus without complications: Secondary | ICD-10-CM | POA: Insufficient documentation

## 2021-04-22 DIAGNOSIS — Z7984 Long term (current) use of oral hypoglycemic drugs: Secondary | ICD-10-CM | POA: Insufficient documentation

## 2021-04-22 DIAGNOSIS — Z794 Long term (current) use of insulin: Secondary | ICD-10-CM | POA: Insufficient documentation

## 2021-04-22 DIAGNOSIS — E039 Hypothyroidism, unspecified: Secondary | ICD-10-CM | POA: Diagnosis not present

## 2021-04-22 DIAGNOSIS — S4992XA Unspecified injury of left shoulder and upper arm, initial encounter: Secondary | ICD-10-CM | POA: Diagnosis not present

## 2021-04-22 DIAGNOSIS — S40022A Contusion of left upper arm, initial encounter: Secondary | ICD-10-CM | POA: Diagnosis not present

## 2021-04-22 DIAGNOSIS — I110 Hypertensive heart disease with heart failure: Secondary | ICD-10-CM | POA: Diagnosis not present

## 2021-04-22 DIAGNOSIS — Z79899 Other long term (current) drug therapy: Secondary | ICD-10-CM | POA: Insufficient documentation

## 2021-04-22 DIAGNOSIS — R600 Localized edema: Secondary | ICD-10-CM

## 2021-04-22 LAB — CBC WITH DIFFERENTIAL/PLATELET
Abs Immature Granulocytes: 0.11 10*3/uL — ABNORMAL HIGH (ref 0.00–0.07)
Basophils Absolute: 0 10*3/uL (ref 0.0–0.1)
Basophils Relative: 0 %
Eosinophils Absolute: 0.1 10*3/uL (ref 0.0–0.5)
Eosinophils Relative: 1 %
HCT: 38.9 % (ref 36.0–46.0)
Hemoglobin: 12.2 g/dL (ref 12.0–15.0)
Immature Granulocytes: 1 %
Lymphocytes Relative: 7 %
Lymphs Abs: 0.8 10*3/uL (ref 0.7–4.0)
MCH: 31 pg (ref 26.0–34.0)
MCHC: 31.4 g/dL (ref 30.0–36.0)
MCV: 98.7 fL (ref 80.0–100.0)
Monocytes Absolute: 0.5 10*3/uL (ref 0.1–1.0)
Monocytes Relative: 4 %
Neutro Abs: 9.6 10*3/uL — ABNORMAL HIGH (ref 1.7–7.7)
Neutrophils Relative %: 87 %
Platelets: 165 10*3/uL (ref 150–400)
RBC: 3.94 MIL/uL (ref 3.87–5.11)
RDW: 15.6 % — ABNORMAL HIGH (ref 11.5–15.5)
WBC: 11.1 10*3/uL — ABNORMAL HIGH (ref 4.0–10.5)
nRBC: 0 % (ref 0.0–0.2)

## 2021-04-22 LAB — BASIC METABOLIC PANEL
Anion gap: 11 (ref 5–15)
BUN: 22 mg/dL (ref 8–23)
CO2: 27 mmol/L (ref 22–32)
Calcium: 9.5 mg/dL (ref 8.9–10.3)
Chloride: 101 mmol/L (ref 98–111)
Creatinine, Ser: 1.09 mg/dL — ABNORMAL HIGH (ref 0.44–1.00)
GFR, Estimated: 55 mL/min — ABNORMAL LOW (ref >60)
Glucose, Bld: 236 mg/dL — ABNORMAL HIGH (ref 70–99)
Potassium: 4.4 mmol/L (ref 3.5–5.1)
Sodium: 139 mmol/L (ref 135–145)

## 2021-04-22 LAB — HEMOGLOBIN A1C
Hgb A1c MFr Bld: 9.4 % — ABNORMAL HIGH (ref 4.8–5.6)
Mean Plasma Glucose: 223.08 mg/dL

## 2021-04-22 NOTE — ED Provider Notes (Signed)
[facility] DEPT Provider Note   CSN: [id number] Arrival date & time: 04/22/21  1441     History Chief Complaint  Patient presents with  . Wound Check    Karolyne Timmons is a 70 y.o. female.  HPI   Patient with history of graves disease, DM, and anemia presents with bruise to upper left arm that she noticed this morning. She had a previous infusion done last week, but states she would have noticed the bruise if it was from that time. The bruise isn't painful. No warmth. It is not spreading. 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 She isn't complaining of any chest pain or fevers.  She is not on any blood thinners. She usually takes fluid pills but states she hasn't taken them for days because it makes her pee more than she wants to. She endorses leg swelling  Past Medical History:  Diagnosis Date  . Anemia   . Celiac disease   . CHF (congestive heart failure) ([place])   . Depression   . Diabetes mellitus without complication ([place])   . Edema    lower extremities  . Emphysema of lung ([place])   . Enlarged heart    begining stage  . Graves disease   . Headache(784.0) 05/18/2013  . Heart murmur   . Hematochezia   . Hemorrhoid   . Hyperlipidemia   . Hypertension   . Migraine   . Obese   . OSA (obstructive sleep apnea)   . Seizures ([place])   . Sleep apnea   . Stroke [facility])    left P-O ICH 2007  . Thyroid disease    hypothyroidism  . Ulcer   . Vitamin D deficiency     Patient Active Problem List   Diagnosis Date Noted  . Morbid obesity ([place]) 11/07/2020  . Diastolic dysfunction 20/94/7096  . Headache(784.0) 05/18/2013  . Chronic diastolic CHF (congestive heart failure) ([place]) 12/04/2011  . Hemorrhoids, internal, with bleeding 11/28/2011    Past Surgical History:  Procedure Laterality Date  . CESAREAN SECTION    . HEMORRHOID SURGERY    . IUD REMOVAL    . TONSILLECTOMY    . VEIN SURGERY       OB  History   No obstetric history on file.     Family History  Problem Relation Age of Onset  . Heart disease Father   . Diabetes Father   . Heart failure Father   . Diabetes Mother   . Thyroid disease Sister   . Diabetes Brother     Social History   Tobacco Use  . Smoking status: Never Smoker  . Smokeless tobacco: Never Used  Vaping Use  . Vaping Use: Never used  Substance Use Topics  . Alcohol use: No  . Drug use: No    Home Medications Prior to Admission medications   Medication Sig Start Date End Date Taking? Authorizing Provider  albuterol (PROAIR HFA) 108 (90 Base) MCG/ACT inhaler Inhale 2 puffs into the lungs every 6 (six) hours as needed for wheezing or shortness of breath.    [provider]  amLODipine (NORVASC) 5 MG tablet Take 1 tablet (5 mg total) by mouth daily. 10/04/20   Nahser, Wonda Cheng, MD  Blood Glucose Monitoring Suppl [facility] VERIO REFLECT) w/Device KIT 1 each by Other route as directed. 08/10/20   [provider]  carvedilol (COREG) 25 MG tablet Take 1 tablet (25 mg total) by  mouth 2 (two) times daily with a meal. 12/05/12   Nahser, Wonda Cheng, MD  Cholecalciferol (VITAMIN D3) 125 MCG (5000 UT) CAPS Take 10,000 tablets by mouth daily.     [provider]  Cyanocobalamin (VITAMIN B-12 CR PO) Take 2 tablets by mouth daily. 1045mg    [provider]  esomeprazole (NEXIUM) 20 MG capsule Take 1 capsule by mouth daily.    [provider]  Fluticasone-Salmeterol (ADVAIR DISKUS) 250-50 MCG/DOSE AEPB Inhale 1 puff into the lungs daily as needed for allergies.    [provider]  furosemide (LASIX) 40 MG tablet Take 1 tablet (40 mg total) by mouth daily as needed for fluid or edema. 11/08/20   Nahser, PWonda Cheng MD  glimepiride (AMARYL) 4 MG tablet Take 4 mg by mouth daily with breakfast.    [provider]  glucose blood (ONETOUCH VERIO) test strip Use as intstructed to check blood sugar once a day Dx Code  E11.9 02/18/21   KElayne Snare MD  [facility] lancets Use as instructed 10/13/20   KElayne Snare MD  levothyroxine (SYNTHROID) 112 MCG tablet Take 1 tablet by mouth Monday through Saturday and 1/2 tablet on Sunday. 11/08/20   KElayne Snare MD  meclizine (ANTIVERT) 25 MG tablet Take 1 tablet by mouth as needed.    [provider]  metFORMIN (GLUCOPHAGE) 500 MG tablet Take 500 mg by mouth 2 (two) times daily. 08/23/20   [provider]  methocarbamol (ROBAXIN) 750 MG tablet Take 1 tablet (750 mg total) by mouth 3 (three) times daily as needed (muscle spasm/pain). 03/26/21   SLajean Saver MD  potassium chloride (KLOR-CON) 10 MEQ tablet Take 2 tablets (20 mEq total) by mouth daily. 11/07/20   Nahser, PWonda Cheng MD  predniSONE (DELTASONE) 10 MG tablet Take 10 mg by mouth as directed. Pt is tapering off, take as prescribed 10/12/20   [provider]  rosuvastatin (CRESTOR) 5 MG tablet Take 1 tablet (5 mg total) by mouth daily. 11/08/20   Nahser, PWonda Cheng MD    Allergies    Gluten meal  Review of Systems   Review of Systems  Constitutional: Negative for chills, fatigue and fever.  HENT: Negative for ear pain and sore throat.   Eyes: Negative for pain and visual disturbance.  Respiratory: Negative for cough, chest tightness and shortness of breath.   Cardiovascular: Positive for leg swelling. Negative for chest pain and palpitations.  Gastrointestinal: Negative for abdominal pain and vomiting.  Genitourinary: Negative for dysuria and hematuria.  Musculoskeletal: Negative for arthralgias and back pain.  Skin: Positive for color change. Negative for rash.  Neurological: Negative for seizures and syncope.  All other systems reviewed and are negative.   Physical Exam Updated Vital Signs BP (!) 158/82   Pulse 78   Temp 98.4 F (36.9 C) (Oral)   Resp 18   SpO2 95%   Physical Exam Vitals and nursing note reviewed. Exam conducted with a chaperone present.   Constitutional:      Appearance: Normal appearance.  HENT:     Head: Normocephalic and atraumatic.  Eyes:     General: No scleral icterus.       Right eye: No discharge.        Left eye: No discharge.     Extraocular Movements: Extraocular movements intact.     Pupils: Pupils are equal, round, and reactive to light.     Comments: Exophophthalmos   Cardiovascular:     Rate and  Rhythm: Normal rate and regular rhythm.     Pulses: Normal pulses.     Heart sounds: Normal heart sounds. No murmur heard. No friction rub. No gallop.      Comments: Pulses 2+.  Pulmonary:     Effort: Pulmonary effort is normal. No respiratory distress.     Breath sounds: Normal breath sounds.  Abdominal:     General: Abdomen is flat. Bowel sounds are normal. There is no distension.     Palpations: Abdomen is soft.     Tenderness: There is no abdominal tenderness.  Musculoskeletal:        General: Normal range of motion.     Right lower leg: Edema present.     Left lower leg: Edema present.  Skin:    General: Skin is warm and dry.     Capillary Refill: Capillary refill takes less than 2 seconds.     Coloration: Skin is not jaundiced.     Findings: Bruising present.     Comments: 2+ pitting edema to mid calf. Significant bruising to the upper left arm where a tourniquet would be. Does not extend circumferentially. No flucutance.  Neurological:     Mental Status: She is alert. Mental status is at baseline.     Coordination: Coordination normal.     ED Results / Procedures / Treatments   Labs (all labs ordered are listed, but only abnormal results are displayed) Labs Reviewed  BASIC METABOLIC PANEL - Abnormal; Notable for the following components:      Result Value   Glucose, Bld 236 (*)    Creatinine, Ser 1.09 (*)    GFR, Estimated 55 (*)    All other components within normal limits  CBC WITH DIFFERENTIAL/PLATELET - Abnormal; Notable for the following components:   WBC 11.1 (*)    RDW 15.6  (*)    Neutro Abs 9.6 (*)    Abs Immature Granulocytes 0.11 (*)    All other components within normal limits  HEMOGLOBIN A1C    EKG None  Radiology No results found.  Procedures Procedures   Medications Ordered in ED Medications - No data to display  ED Course  I have reviewed the triage vital signs and the nursing notes.  Pertinent labs & imaging results that were available during my care of the patient were reviewed by me and considered in my medical decision making (see chart for details).    MDM Rules/Calculators/A&P                          Patient is a 70 year old female with h/o CHF, DM, and Graves disease. She is presenting for upper extremity bruising to the left. Patient nontoxic, vitals stable. PE reassuring. Bruising is non-tender, non-circumferential and not spreading. CBC shows normal platelets. Mildly elevated WBC count but likely unrelated. She is not on a blood thinners. 2+ pitting edema in legs bilaterally. The contusion will heal on its own with time. Advised patient to be patient and to follow up if condition worsens or fails to improve in the next 6 weeks.  Patient stopped using compression stockings and taking fluid pills because bothered by increased urinary urgency. Advised patient to start medication again and to use compression stockings to decrease the fluid in her legs.  Discussed HPI, physical exam and plan of care for this patient with attending Mali Sheldon. The attending physician evaluated this patient as part of a shared visit and agrees with  plan of care.  Final Clinical Impression(s) / ED Diagnoses Final diagnoses:  None    Rx / DC Orders ED Discharge Orders    None       Sherrill Raring, Hershal Coria 04/22/21 1701    Truddie Hidden, MD 04/22/21 2039

## 2021-04-22 NOTE — Discharge Instructions (Addendum)
Bruising is most likely due to tourniquet from infusion. It will get better on its own, but may take a few weeks. You can use a heat compress for pain. There is increased water in your legs. Please take your fluid pills as prescribed and use compression stockings. Return to the ED for chest pain, SOB, or failure to improve.

## 2021-04-22 NOTE — ED Triage Notes (Signed)
Pt reports noticing large bruising to her upper left arm early this morning. Pt denies any pain and itching.

## 2021-04-22 NOTE — ED Provider Notes (Signed)
Emergency Medicine Provider Triage Evaluation Note  [name] , a 70 y.o. female  was evaluated in triage.  Pt complains of unknown bruising to left upper arm that she noticed this morning. Pt is unsure how long it has been there. She cannot recall looking at her arm at all  Yesterday as she typically wears her house coat all day. She thinks maybe her arm did not have bruising 2 days ago. She mentions receiving an injection in her arm at the end of April for her thyroid eye disease. She also mentions she started taking rosuvastatin again recently and that's the only new medication she is on. Denies pain, swelling, itching to the arm. No known blood disorder or cancer hx. Pt also requesting to have her a1c checked as her ophthalmologist needs one and she does not have a PCP appointment until next month.   Review of Systems  Positive: + bruising to left arm Negative: - pain  Physical Exam  BP (!) 138/97 (BP Location: Left Arm)   Pulse 82   Temp 98.4 F (36.9 C) (Oral)   Resp 18   SpO2 95%  Gen:   Awake, no distress   Resp:  Normal effort  MSK:   Moves extremities without difficulty. Large area of ecchymosis noted to left upper arm without TTP. No swelling appreciated to LUE compared to RUE. 2+ radial pulse.    Medical Decision Making  Medically screening exam initiated at 3:21 PM.  Appropriate orders placed.  [name] was informed that the remainder of the evaluation will be completed by another provider, this initial triage assessment does not replace that evaluation, and the importance of remaining in the ED until their evaluation is complete.     Tanda Rockers, PA-C 04/22/21 1524    Cheryll Cockayne, MD 04/22/21 2233

## 2021-04-26 DIAGNOSIS — S40022D Contusion of left upper arm, subsequent encounter: Secondary | ICD-10-CM | POA: Diagnosis not present

## 2021-04-26 DIAGNOSIS — E1122 Type 2 diabetes mellitus with diabetic chronic kidney disease: Secondary | ICD-10-CM | POA: Diagnosis not present

## 2021-04-26 DIAGNOSIS — Z09 Encounter for follow-up examination after completed treatment for conditions other than malignant neoplasm: Secondary | ICD-10-CM | POA: Diagnosis not present

## 2021-04-26 DIAGNOSIS — E673 Hypervitaminosis D: Secondary | ICD-10-CM | POA: Diagnosis not present

## 2021-05-01 DIAGNOSIS — R739 Hyperglycemia, unspecified: Secondary | ICD-10-CM | POA: Diagnosis not present

## 2021-05-01 DIAGNOSIS — T380X5A Adverse effect of glucocorticoids and synthetic analogues, initial encounter: Secondary | ICD-10-CM | POA: Diagnosis not present

## 2021-05-01 DIAGNOSIS — E1165 Type 2 diabetes mellitus with hyperglycemia: Secondary | ICD-10-CM | POA: Diagnosis not present

## 2021-05-01 DIAGNOSIS — E039 Hypothyroidism, unspecified: Secondary | ICD-10-CM | POA: Diagnosis not present

## 2021-05-02 DIAGNOSIS — E05 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: Secondary | ICD-10-CM | POA: Diagnosis not present

## 2021-05-05 DIAGNOSIS — E05 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: Secondary | ICD-10-CM | POA: Diagnosis not present

## 2021-05-09 DIAGNOSIS — R197 Diarrhea, unspecified: Secondary | ICD-10-CM | POA: Diagnosis not present

## 2021-05-09 DIAGNOSIS — R5383 Other fatigue: Secondary | ICD-10-CM | POA: Diagnosis not present

## 2021-05-09 DIAGNOSIS — R350 Frequency of micturition: Secondary | ICD-10-CM | POA: Diagnosis not present

## 2021-05-09 DIAGNOSIS — E1122 Type 2 diabetes mellitus with diabetic chronic kidney disease: Secondary | ICD-10-CM | POA: Diagnosis not present

## 2021-05-09 DIAGNOSIS — R634 Abnormal weight loss: Secondary | ICD-10-CM | POA: Diagnosis not present

## 2021-05-17 DIAGNOSIS — E1165 Type 2 diabetes mellitus with hyperglycemia: Secondary | ICD-10-CM | POA: Diagnosis not present

## 2021-05-17 DIAGNOSIS — R739 Hyperglycemia, unspecified: Secondary | ICD-10-CM | POA: Diagnosis not present

## 2021-05-17 DIAGNOSIS — T380X5A Adverse effect of glucocorticoids and synthetic analogues, initial encounter: Secondary | ICD-10-CM | POA: Diagnosis not present

## 2021-05-17 DIAGNOSIS — E039 Hypothyroidism, unspecified: Secondary | ICD-10-CM | POA: Diagnosis not present

## 2021-05-22 ENCOUNTER — Encounter (HOSPITAL_COMMUNITY): Payer: Self-pay

## 2021-05-22 ENCOUNTER — Ambulatory Visit (HOSPITAL_COMMUNITY)
Admission: EM | Admit: 2021-05-22 | Discharge: 2021-05-22 | Disposition: A | Discharge status: Home / Self Care | Payer: Medicare HMO | Attending: Emergency Medicine | Admitting: Emergency Medicine

## 2021-05-22 DIAGNOSIS — M7989 Other specified soft tissue disorders: Secondary | ICD-10-CM

## 2021-05-22 MED ORDER — FUROSEMIDE 20 MG PO TABS: 20.0000 mg | ORAL_TABLET | Freq: Every day | ORAL | 0 refills | Status: AC

## 2021-05-22 NOTE — ED Triage Notes (Signed)
Pt presents with bilateral foot swelling and pain X 1 week with no relief and no injury involved.

## 2021-05-22 NOTE — ED Provider Notes (Signed)
[facility]    CSN: [id number] Arrival date & time: 05/22/21  1523      History   Chief Complaint Chief Complaint  Patient presents with  . Foot Pain    HPI [name] is a 70 y.o. female.   Patient presents with bilateral foot, ankle and lower extremity swelling for 1 week. Has made it difficult to walk. Has baseline tingling which has not worsened. Per family member foot, ankle and legs are normally swollen but appear to be worse. Denies injury or trauma. ROM intact. History of CHF, taken 40 mg lasix daily. Denies weight gain. Has been using compression stockings at times and elevation with no relief. Denies worsened shortness of breath from baseline, difficulty breathing, chest pain or tightness.   Past Medical History:  Diagnosis Date  . Anemia   . Celiac disease   . CHF (congestive heart failure) ([place])   . Depression   . Diabetes mellitus without complication ([place])   . Edema    lower extremities  . Emphysema of lung ([place])   . Enlarged heart    begining stage  . Graves disease   . Headache(784.0) 05/18/2013  . Heart murmur   . Hematochezia   . Hemorrhoid   . Hyperlipidemia   . Hypertension   . Migraine   . Obese   . OSA (obstructive sleep apnea)   . Seizures ([place])   . Sleep apnea   . Stroke [facility])    left P-O ICH 2007  . Thyroid disease    hypothyroidism  . Ulcer   . Vitamin D deficiency     Patient Active Problem List   Diagnosis Date Noted  . Morbid obesity ([place]) 11/07/2020  . Diastolic dysfunction 99/35/7017  . Headache(784.0) 05/18/2013  . Chronic diastolic CHF (congestive heart failure) ([place]) 12/04/2011  . Hemorrhoids, internal, with bleeding 11/28/2011    Past Surgical History:  Procedure Laterality Date  . CESAREAN SECTION    . HEMORRHOID SURGERY    . IUD REMOVAL    . TONSILLECTOMY    . VEIN SURGERY      OB History   No obstetric history on file.      Home Medications    Prior to Admission medications    Medication Sig Start Date End Date Taking? Authorizing Provider  furosemide (LASIX) 20 MG tablet Take 1 tablet (20 mg total) by mouth daily. 05/22/21  Yes , Leitha Schuller, NP  albuterol (PROAIR HFA) 108 (90 Base) MCG/ACT inhaler Inhale 2 puffs into the lungs every 6 (six) hours as needed for wheezing or shortness of breath.    [provider]  amLODipine (NORVASC) 5 MG tablet Take 1 tablet (5 mg total) by mouth daily. 10/04/20   Nahser, Wonda Cheng, MD  Blood Glucose Monitoring Suppl [facility] VERIO REFLECT) w/Device KIT 1 each by Other route as directed. 08/10/20   [provider]  carvedilol (COREG) 25 MG tablet Take 1 tablet (25 mg total) by mouth 2 (two) times daily with a meal. 12/05/12   Nahser, Wonda Cheng, MD  Cholecalciferol (VITAMIN D3) 125 MCG (5000 UT) CAPS Take 10,000 tablets by mouth daily.     [provider]  Cyanocobalamin (VITAMIN B-12 CR PO) Take 2 tablets by mouth daily. 1030mg    [provider]  esomeprazole (NEXIUM) 20 MG capsule Take 1 capsule by mouth daily.    [provider]  Fluticasone-Salmeterol (ADVAIR DISKUS) 250-50 MCG/DOSE AEPB Inhale 1 puff into the lungs daily as  needed for allergies.    [provider]  furosemide (LASIX) 40 MG tablet Take 1 tablet (40 mg total) by mouth daily as needed for fluid or edema. 11/08/20   Nahser, Wonda Cheng, MD  glimepiride (AMARYL) 4 MG tablet Take 4 mg by mouth daily with breakfast.    [provider]  glucose blood (ONETOUCH VERIO) test strip Use as intstructed to check blood sugar once a day Dx Code E11.9 02/18/21   Elayne Snare, MD  Lancets [facility] ULTRASOFT) lancets Use as instructed 10/13/20   Elayne Snare, MD  levothyroxine (SYNTHROID) 112 MCG tablet Take 1 tablet by mouth Monday through Saturday and 1/2 tablet on Sunday. 11/08/20   Elayne Snare, MD  meclizine (ANTIVERT) 25 MG tablet Take 1 tablet by mouth as needed.    [provider]  metFORMIN (GLUCOPHAGE) 500 MG  tablet Take 500 mg by mouth 2 (two) times daily. 08/23/20   [provider]  methocarbamol (ROBAXIN) 750 MG tablet Take 1 tablet (750 mg total) by mouth 3 (three) times daily as needed (muscle spasm/pain). 03/26/21   Lajean Saver, MD  potassium chloride (KLOR-CON) 10 MEQ tablet Take 2 tablets (20 mEq total) by mouth daily. 11/07/20   Nahser, Wonda Cheng, MD  predniSONE (DELTASONE) 10 MG tablet Take 40 mg by mouth as directed. Pt is tapering off, take as prescribed 10/12/20   [provider]  rosuvastatin (CRESTOR) 5 MG tablet Take 1 tablet (5 mg total) by mouth daily. 11/08/20   Nahser, Wonda Cheng, MD    Family History Family History  Problem Relation Age of Onset  . Heart disease Father   . Diabetes Father   . Heart failure Father   . Diabetes Mother   . Thyroid disease Sister   . Diabetes Brother     Social History Social History   Tobacco Use  . Smoking status: Never Smoker  . Smokeless tobacco: Never Used  Vaping Use  . Vaping Use: Never used  Substance Use Topics  . Alcohol use: No  . Drug use: No     Allergies   Gluten meal   Review of Systems Review of Systems  Constitutional: Negative.   Respiratory: Negative.   Cardiovascular: Positive for leg swelling. Negative for chest pain and palpitations.  Gastrointestinal: Negative.   Genitourinary: Negative.   Skin: Negative.   Neurological: Negative.      Physical Exam Triage Vital Signs ED Triage Vitals  Enc Vitals Group     BP 05/22/21 1614 117/73     Pulse Rate 05/22/21 1614 67     Resp 05/22/21 1614 17     Temp 05/22/21 1614 98.3 F (36.8 C)     Temp Source 05/22/21 1614 Oral     SpO2 05/22/21 1614 94 %     Weight --      Height --      Head Circumference --      Peak Flow --      Pain Score 05/22/21 1617 7     Pain Loc --      Pain Edu? --      Excl. in [place]? --    No data found.  Updated Vital Signs BP 117/73 (BP Location: Right Arm)   Pulse 67   Temp 98.3 F (36.8 C) (Oral)    Resp 17   SpO2 94%   Visual Acuity Right Eye Distance:   Left Eye Distance:   Bilateral Distance:    Right Eye Near:  Left Eye Near:    Bilateral Near:     Physical Exam Constitutional:      Appearance: Normal appearance. She is obese.  HENT:     Head: Normocephalic.  Musculoskeletal:        General: Normal range of motion.     Cervical back: Normal range of motion.     Right lower leg: No tenderness. 4+ Pitting Edema present.     Left lower leg: No tenderness. 4+ Pitting Edema present.     Right ankle: Normal.     Left ankle: Normal.     Right foot: Normal.     Left foot: Normal.     Comments: Edema extends into bilateral ankles and feet  Skin:    General: Skin is warm and dry.  Neurological:     Mental Status: She is alert.      UC Treatments / Results  Labs (all labs ordered are listed, but only abnormal results are displayed) Labs Reviewed - No data to display  EKG   Radiology No results found.  Procedures Procedures (including critical care time)  Medications Ordered in UC Medications - No data to display  Initial Impression / Assessment and Plan / UC Course  I have reviewed the triage vital signs and the nursing notes.  Pertinent labs & imaging results that were available during my care of the patient were reviewed by me and considered in my medical decision making (see chart for details).  Swelling of right and left lower extremities  1. Lasix 20 mg daily in addition to 40 mg lasix daily, labs reviewed, kidney function stable, no respiratory involvement , vital signs stable  2. Follow up with cardiologist within the next 2 weeks  3. Strict precautions given for worsening condition and when to follow up for emergency department evaluation, patient and family verbalized understanding 4. Continue use of compression stockings and elevation  Final Clinical Impressions(s) / UC Diagnoses   Final diagnoses:  Swelling of left lower extremity  Swelling  of right lower extremity     Discharge Instructions     You are take one additional 20 mg pill each day 7 days, this is in addition to the 40 mg you already take  Please follow up with Dr. Manley Mason, your heart doctor within the next 2 weeks or as soon as you can get an appointment to be reevaluated  Continue taking your potassium medication as prescribed  At any point if shortness of breath begins to worsens, having trouble breathing, chest pain or tightness begins please go to the nearest emergency department    ED Prescriptions    Medication Sig Dispense Auth. Provider   furosemide (LASIX) 20 MG tablet Take 1 tablet (20 mg total) by mouth daily. 7 tablet , Leitha Schuller, NP     PDMP not reviewed this encounter.   Hans Eden, NP 05/22/21 [phone]

## 2021-05-22 NOTE — Discharge Instructions (Signed)
You are take one additional 20 mg pill each day 7 days, this is in addition to the 40 mg you already take  Please follow up with Dr. Andree Coss, your heart doctor within the next 2 weeks or as soon as you can get an appointment to be reevaluated  Continue taking your potassium medication as prescribed  At any point if shortness of breath begins to worsens, having trouble breathing, chest pain or tightness begins please go to the nearest emergency department

## 2021-05-23 ENCOUNTER — Other Ambulatory Visit: Payer: Self-pay

## 2021-05-23 ENCOUNTER — Emergency Department (HOSPITAL_COMMUNITY)
Admission: EM | Admit: 2021-05-23 | Discharge: 2021-05-24 | Disposition: A | Discharge status: Home / Self Care | Payer: Medicare HMO | Source: Home / Self Care | Attending: Emergency Medicine | Admitting: Emergency Medicine

## 2021-05-23 ENCOUNTER — Emergency Department (HOSPITAL_COMMUNITY): Payer: Medicare HMO

## 2021-05-23 DIAGNOSIS — N1831 Chronic kidney disease, stage 3a: Secondary | ICD-10-CM | POA: Insufficient documentation

## 2021-05-23 DIAGNOSIS — R300 Dysuria: Secondary | ICD-10-CM | POA: Insufficient documentation

## 2021-05-23 DIAGNOSIS — R739 Hyperglycemia, unspecified: Secondary | ICD-10-CM

## 2021-05-23 DIAGNOSIS — I5032 Chronic diastolic (congestive) heart failure: Secondary | ICD-10-CM | POA: Insufficient documentation

## 2021-05-23 DIAGNOSIS — K219 Gastro-esophageal reflux disease without esophagitis: Secondary | ICD-10-CM | POA: Insufficient documentation

## 2021-05-23 DIAGNOSIS — K5780 Diverticulitis of intestine, part unspecified, with perforation and abscess without bleeding: Secondary | ICD-10-CM | POA: Diagnosis not present

## 2021-05-23 DIAGNOSIS — I639 Cerebral infarction, unspecified: Secondary | ICD-10-CM | POA: Insufficient documentation

## 2021-05-23 DIAGNOSIS — Z7984 Long term (current) use of oral hypoglycemic drugs: Secondary | ICD-10-CM | POA: Insufficient documentation

## 2021-05-23 DIAGNOSIS — R0902 Hypoxemia: Secondary | ICD-10-CM | POA: Diagnosis not present

## 2021-05-23 DIAGNOSIS — E876 Hypokalemia: Secondary | ICD-10-CM | POA: Diagnosis not present

## 2021-05-23 DIAGNOSIS — Z79899 Other long term (current) drug therapy: Secondary | ICD-10-CM | POA: Insufficient documentation

## 2021-05-23 DIAGNOSIS — R1110 Vomiting, unspecified: Secondary | ICD-10-CM | POA: Diagnosis not present

## 2021-05-23 DIAGNOSIS — G720 Drug-induced myopathy: Secondary | ICD-10-CM | POA: Insufficient documentation

## 2021-05-23 DIAGNOSIS — R609 Edema, unspecified: Secondary | ICD-10-CM | POA: Diagnosis not present

## 2021-05-23 DIAGNOSIS — R0602 Shortness of breath: Secondary | ICD-10-CM | POA: Diagnosis not present

## 2021-05-23 DIAGNOSIS — E1121 Type 2 diabetes mellitus with diabetic nephropathy: Secondary | ICD-10-CM | POA: Insufficient documentation

## 2021-05-23 DIAGNOSIS — J4540 Moderate persistent asthma, uncomplicated: Secondary | ICD-10-CM | POA: Insufficient documentation

## 2021-05-23 DIAGNOSIS — E1122 Type 2 diabetes mellitus with diabetic chronic kidney disease: Secondary | ICD-10-CM | POA: Insufficient documentation

## 2021-05-23 DIAGNOSIS — I129 Hypertensive chronic kidney disease with stage 1 through stage 4 chronic kidney disease, or unspecified chronic kidney disease: Secondary | ICD-10-CM | POA: Insufficient documentation

## 2021-05-23 DIAGNOSIS — I499 Cardiac arrhythmia, unspecified: Secondary | ICD-10-CM | POA: Insufficient documentation

## 2021-05-23 DIAGNOSIS — E05 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: Secondary | ICD-10-CM | POA: Insufficient documentation

## 2021-05-23 DIAGNOSIS — E785 Hyperlipidemia, unspecified: Secondary | ICD-10-CM | POA: Insufficient documentation

## 2021-05-23 DIAGNOSIS — I2699 Other pulmonary embolism without acute cor pulmonale: Secondary | ICD-10-CM | POA: Diagnosis not present

## 2021-05-23 DIAGNOSIS — R2243 Localized swelling, mass and lump, lower limb, bilateral: Secondary | ICD-10-CM | POA: Insufficient documentation

## 2021-05-23 DIAGNOSIS — R109 Unspecified abdominal pain: Secondary | ICD-10-CM | POA: Diagnosis not present

## 2021-05-23 DIAGNOSIS — E1165 Type 2 diabetes mellitus with hyperglycemia: Secondary | ICD-10-CM | POA: Diagnosis not present

## 2021-05-23 DIAGNOSIS — K5720 Diverticulitis of large intestine with perforation and abscess without bleeding: Secondary | ICD-10-CM | POA: Diagnosis not present

## 2021-05-23 DIAGNOSIS — I10 Essential (primary) hypertension: Secondary | ICD-10-CM | POA: Diagnosis not present

## 2021-05-23 DIAGNOSIS — I130 Hypertensive heart and chronic kidney disease with heart failure and stage 1 through stage 4 chronic kidney disease, or unspecified chronic kidney disease: Secondary | ICD-10-CM | POA: Insufficient documentation

## 2021-05-23 LAB — CBC WITH DIFFERENTIAL/PLATELET
Abs Immature Granulocytes: 0.04 10*3/uL (ref 0.00–0.07)
Basophils Absolute: 0 10*3/uL (ref 0.0–0.1)
Basophils Relative: 0 %
Eosinophils Absolute: 0 10*3/uL (ref 0.0–0.5)
Eosinophils Relative: 0 %
HCT: 37.4 % (ref 36.0–46.0)
Hemoglobin: 12.3 g/dL (ref 12.0–15.0)
Immature Granulocytes: 0 %
Lymphocytes Relative: 4 %
Lymphs Abs: 0.4 10*3/uL — ABNORMAL LOW (ref 0.7–4.0)
MCH: 31.5 pg (ref 26.0–34.0)
MCHC: 32.9 g/dL (ref 30.0–36.0)
MCV: 95.7 fL (ref 80.0–100.0)
Monocytes Absolute: 0.2 10*3/uL (ref 0.1–1.0)
Monocytes Relative: 2 %
Neutro Abs: 8.6 10*3/uL — ABNORMAL HIGH (ref 1.7–7.7)
Neutrophils Relative %: 94 %
Platelets: 96 10*3/uL — ABNORMAL LOW (ref 150–400)
RBC: 3.91 MIL/uL (ref 3.87–5.11)
RDW: 14.6 % (ref 11.5–15.5)
WBC: 9.2 10*3/uL (ref 4.0–10.5)
nRBC: 0 % (ref 0.0–0.2)

## 2021-05-23 LAB — COMPREHENSIVE METABOLIC PANEL
ALT: 34 U/L (ref 0–44)
AST: 31 U/L (ref 15–41)
Albumin: 3.8 g/dL (ref 3.5–5.0)
Alkaline Phosphatase: 62 U/L (ref 38–126)
Anion gap: 12 (ref 5–15)
BUN: 31 mg/dL — ABNORMAL HIGH (ref 8–23)
CO2: 25 mmol/L (ref 22–32)
Calcium: 9.1 mg/dL (ref 8.9–10.3)
Chloride: 98 mmol/L (ref 98–111)
Creatinine, Ser: 1.18 mg/dL — ABNORMAL HIGH (ref 0.44–1.00)
GFR, Estimated: 50 mL/min — ABNORMAL LOW (ref >60)
Glucose, Bld: 405 mg/dL — ABNORMAL HIGH (ref 70–99)
Potassium: 4.9 mmol/L (ref 3.5–5.1)
Sodium: 135 mmol/L (ref 135–145)
Total Bilirubin: 1.8 mg/dL — ABNORMAL HIGH (ref 0.3–1.2)
Total Protein: 6.3 g/dL — ABNORMAL LOW (ref 6.5–8.1)

## 2021-05-23 LAB — URINALYSIS, ROUTINE W REFLEX MICROSCOPIC
Bilirubin Urine: NEGATIVE
Glucose, UA: >=500 mg/dL — AB
Ketones, ur: 5 mg/dL — AB
Nitrite: NEGATIVE
Protein, ur: NEGATIVE mg/dL
Specific Gravity, Urine: 1.009 (ref 1.005–1.030)
pH: 5 (ref 5.0–8.0)

## 2021-05-23 LAB — CBG MONITORING, ED
Glucose-Capillary: 398 mg/dL — ABNORMAL HIGH (ref 70–99)
Glucose-Capillary: 388 mg/dL — ABNORMAL HIGH (ref 70–99)

## 2021-05-23 LAB — BRAIN NATRIURETIC PEPTIDE: B Natriuretic Peptide: 100.2 pg/mL — ABNORMAL HIGH (ref 0.0–100.0)

## 2021-05-23 LAB — BETA-HYDROXYBUTYRIC ACID: Beta-Hydroxybutyric Acid: 2.84 mmol/L — ABNORMAL HIGH (ref 0.05–0.27)

## 2021-05-23 MED ORDER — SODIUM CHLORIDE 0.9 % IV BOLUS: 500.0000 mL | Freq: Once | INTRAVENOUS | Status: DC

## 2021-05-23 MED ORDER — SODIUM CHLORIDE 0.9 % IV BOLUS
500.0000 mL | Freq: Once | INTRAVENOUS | Status: AC
  Administered 2021-05-23: 500 mL via INTRAVENOUS

## 2021-05-23 NOTE — Discharge Instructions (Signed)
You came to the emergency department today to have your leg swelling and high blood sugar assessed.  Your physical exam was reassuring.  Based on your lab work you are leg swelling does not appear to be due to acute heart failure exacerbation at this time.  Please continue to take the increased Lasix as you were prescribed at the urgent care.  Please call your cardiologist tomorrow to set up a follow-up appointment.  Please elevate your legs as much as possible to help reduce swelling.  Please wear compression stockings to help reduce swelling.  Your blood sugar was elevated today.  This is likely due to combination of the prednisone you are taking as well as not using your insulin.  It is very important that you use your insulin as prescribed.  If you have any questions or concerns please call your endocrinologist tomorrow.    Get help right away if: Your blood glucose monitor reads "high" even when you are taking insulin. You have trouble breathing. You have a change in how you think, feel, or act (mental status). You have nausea or vomiting that does not go away. Develop shortness of breath, especially when you are lying down. Have pain in your chest or abdomen. Feel weak. Feel faint.

## 2021-05-23 NOTE — ED Provider Notes (Signed)
  Face-to-face evaluation   History: She is presenting for difficulty moving her legs, and setting up, ongoing for several weeks.  She is currently receiving therapies or treatments for proptosis related to thyroid disease.  Yesterday she saw her PCP who increased her Lasix, because of peripheral edema.  She is chronically on prednisone apparently because of the eye disorder.  Physical exam: Obese, alert and cooperative.  No respiratory distress.  3-4+ lower leg edema bilaterally.  Legs are not particularly tender.  There are some discoloration areas on the ankles and feet bilaterally, consistent with chronic vascular insufficiency.  Medical screening examination/treatment/procedure(s) were conducted as a shared visit with non-physician practitioner(s) and myself.  I personally evaluated the patient during the encounter    Mancel Bale, MD 05/24/21 1136

## 2021-05-23 NOTE — ED Provider Notes (Addendum)
[facility] DEPT Provider Note   CSN: [id number] Arrival date & time: 05/23/21  1748     History Chief Complaint  Patient presents with  . Hyperglycemia  . Leg Swelling    [name] is a 70 y.o. female with a history of anemia, CHF, diabetes mellitus, lower extremity edema, Graves' disease, CKD stage III.  Patient presents to the emergency department with a chief complaint of edema to bilateral lower extremities and feet as well as hyperglycemia.  Patient reports that her swelling has been present over the last 4 days.  Swelling has gradually become worse.  Patient is having difficulty moving her limbs due to increased edema.  Patient denies any tenderness, rash, or erythema to bilateral lower extremities.  Per chart review patient was seen at urgent care yesterday and prescribed an additional 20 mg of oral furosemide for the next 7 days.  Patient was advised to follow-up with her cardiologist.  Patient normally takes 40 mg oral furosemide daily.  Patient denies any history of PE or DVT, cancer treatment, hormone therapy, surgery in the last 12 weeks.  Patient has baseline numbness to bilateral lower extremities due to her neuropathy.  Patient has had no change in her numbness over the last 4 days.  Patient endorses shortness of breath with ambulation.  This shortness of breath has been present over the last year and has not worsened recently.  Patient denies any orthopnea, paroxysmal nocturnal dyspnea, chest pain, shortness of breath at rest, palpitations, recent weight gain.  Patient reports hyperglycemia.  Patient was started on insulin 3 times daily with meals.  Patient reports that she has not been taking her insulin due to being nervous about the needle injection.  Patient has been taking her prescribed oral diabetes medications.  Patient is on prednisone daily for her Graves' disease.  Patient denies any nausea, vomiting, or abdominal pain.  Patient  also endorses dysuria present over the last few days.  Patient denies any hematuria, vaginal pain, vaginal bleeding, vaginal discharge, genital sores or lesions, flank pain, fevers, chills.  HPI     Past Medical History:  Diagnosis Date  . Anemia   . Celiac disease   . CHF (congestive heart failure) ([place])   . Depression   . Diabetes mellitus without complication ([place])   . Edema    lower extremities  . Emphysema of lung ([place])   . Enlarged heart    begining stage  . Graves disease   . Headache(784.0) 05/18/2013  . Heart murmur   . Hematochezia   . Hemorrhoid   . Hyperlipidemia   . Hypertension   . Migraine   . Obese   . OSA (obstructive sleep apnea)   . Seizures ([place])   . Sleep apnea   . Stroke [facility])    left P-O ICH 2007  . Thyroid disease    hypothyroidism  . Ulcer   . Vitamin D deficiency     Patient Active Problem List   Diagnosis Date Noted  . Cardiac arrhythmia 05/23/2021  . Chronic kidney disease due to hypertension 05/23/2021  . Chronic kidney disease, stage 3a ([place]) 05/23/2021  . Diabetic renal disease ([place]) 05/23/2021  . 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  . Gastroesophageal reflux disease 05/23/2021  . Hyperlipidemia 05/23/2021  . Moderate persistent asthma, uncomplicated 69/67/8938  . Stroke ([place]) 05/23/2021  . 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 Morbid obesity ([place]) 11/07/2020  . Optic neuropathy, compressive 08/24/2020  .  Thyroid eye disease 08/04/2020  . 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 ([place]) 04/09/2020  . Hypertensive retinopathy of both eyes 04/09/2020  . Anemia 08/07/2017  . Celiac disease 08/07/2017  . Graves' disease with exophthalmos 08/07/2017  . History of depression 08/07/2017  . Diastolic dysfunction 32/20/2542  . Headache(784.0) 05/18/2013  . Chronic diastolic CHF (congestive heart failure) ([place]) 12/04/2011  . Hemorrhoids, internal, with bleeding 11/28/2011  . Seizure  ([place]) 06/30/2010    Past Surgical History:  Procedure Laterality Date  . CESAREAN SECTION    . HEMORRHOID SURGERY    . IUD REMOVAL    . TONSILLECTOMY    . VEIN SURGERY       OB History   No obstetric history on file.     Family History  Problem Relation Age of Onset  . Heart disease Father   . Diabetes Father   . Heart failure Father   . Diabetes Mother   . Thyroid disease Sister   . Diabetes Brother     Social History   Tobacco Use  . Smoking status: Never Smoker  . Smokeless tobacco: Never Used  Vaping Use  . Vaping Use: Never used  Substance Use Topics  . Alcohol use: No  . Drug use: No    Home Medications Prior to Admission medications   Medication Sig Start Date End Date Taking? Authorizing Provider  albuterol (PROAIR HFA) 108 (90 Base) MCG/ACT inhaler Inhale 2 puffs into the lungs every 6 (six) hours as needed for wheezing or shortness of breath.    [provider]  amLODipine (NORVASC) 5 MG tablet Take 1 tablet (5 mg total) by mouth daily. 10/04/20   Nahser, Wonda Cheng, MD  Blood Glucose Monitoring Suppl [facility] VERIO REFLECT) w/Device KIT 1 each by Other route as directed. 08/10/20   [provider]  carvedilol (COREG) 25 MG tablet Take 1 tablet (25 mg total) by mouth 2 (two) times daily with a meal. 12/05/12   Nahser, Wonda Cheng, MD  Cholecalciferol (VITAMIN D3) 125 MCG (5000 UT) CAPS Take 10,000 tablets by mouth daily.     [provider]  Cyanocobalamin (VITAMIN B-12 CR PO) Take 2 tablets by mouth daily. 1035mg    [provider]  esomeprazole (NEXIUM) 20 MG capsule Take 1 capsule by mouth daily.    [provider]  Fluticasone-Salmeterol (ADVAIR DISKUS) 250-50 MCG/DOSE AEPB Inhale 1 puff into the lungs daily as needed for allergies.    [provider]  furosemide (LASIX) 20 MG tablet Take 1 tablet (20 mg total) by mouth daily. 05/22/21   WHans Eden NP  furosemide (LASIX) 40 MG tablet Take 1 tablet  (40 mg total) by mouth daily as needed for fluid or edema. 11/08/20   Nahser, PWonda Cheng MD  glimepiride (AMARYL) 4 MG tablet Take 4 mg by mouth daily with breakfast.    [provider]  glucose blood (ONETOUCH VERIO) test strip Use as intstructed to check blood sugar once a day Dx Code E11.9 02/18/21   KElayne Snare MD  Lancets ([facility]) lancets Use as instructed 10/13/20   KElayne Snare MD  levothyroxine (SYNTHROID) 112 MCG tablet Take 1 tablet by mouth Monday through Saturday and 1/2 tablet on Sunday. 11/08/20   KElayne Snare MD  meclizine (ANTIVERT) 25 MG tablet Take 1 tablet by mouth as needed.    [provider]  metFORMIN (GLUCOPHAGE) 500 MG tablet Take 500 mg by mouth 2 (two) times daily. 08/23/20   [provider]  methocarbamol (ROBAXIN) 750 MG tablet Take 1 tablet (750 mg total) by mouth 3 (three) times daily as needed (muscle spasm/pain). 03/26/21   Lajean Saver, MD  potassium chloride (KLOR-CON) 10 MEQ tablet Take 2 tablets (20 mEq total) by mouth daily. 11/07/20   Nahser, Wonda Cheng, MD  predniSONE (DELTASONE) 10 MG tablet Take 40 mg by mouth as directed. Pt is tapering off, take as prescribed 10/12/20   [provider]  rosuvastatin (CRESTOR) 5 MG tablet Take 1 tablet (5 mg total) by mouth daily. 11/08/20   Nahser, Wonda Cheng, MD    Allergies    Gluten meal  Review of Systems   Review of Systems  Constitutional: Negative for chills and fever.  Eyes: Positive for discharge (baseline watery d/c from bilateral eyes). Negative for visual disturbance.  Respiratory: Positive for shortness of breath (w/ exertion x1year).   Cardiovascular: Positive for leg swelling. Negative for chest pain and palpitations.  Gastrointestinal: Positive for diarrhea. Negative for abdominal pain, nausea and vomiting.  Genitourinary: Positive for dysuria. Negative for difficulty urinating, flank pain, frequency, hematuria, pelvic pain, vaginal bleeding, vaginal discharge  and vaginal pain.  Musculoskeletal: Negative for back pain and neck pain.  Skin: Negative for color change, pallor, rash and wound.  Neurological: Negative for dizziness, syncope, light-headedness and headaches.  Psychiatric/Behavioral: Negative for confusion.    Physical Exam Updated Vital Signs BP 105/71   Pulse 63   Temp 98.3 F (36.8 C) (Oral)   Resp 16   Ht 5' 2"  (1.575 m)   SpO2 98%   BMI 49.31 kg/m   Physical Exam Vitals and nursing note reviewed.  Constitutional:      General: She is not in acute distress.    Appearance: She is morbidly obese. She is not ill-appearing, toxic-appearing or diaphoretic.  HENT:     Head: Normocephalic.  Eyes:     General: No scleral icterus.       Right eye: Discharge present.        Left eye: Discharge present.    Conjunctiva/sclera:     Right eye: Right conjunctiva is not injected. No chemosis, exudate or hemorrhage.    Left eye: Left conjunctiva is not injected. No chemosis, exudate or hemorrhage.    Comments: Watery discharge from bilateral eyes.  Exophthalmos to bilateral eyes  Cardiovascular:     Rate and Rhythm: Normal rate.     Pulses:          Dorsalis pedis pulses are 2+ on the right side and 2+ on the left side.  Pulmonary:     Effort: Pulmonary effort is normal. No tachypnea, bradypnea or respiratory distress.     Breath sounds: Normal breath sounds. No stridor.     Comments: Lungs clear to auscultation bilaterally.  Patient able speak in full complete sentences without difficulty Abdominal:     General: There is no distension. There are no signs of injury.     Palpations: There is no mass or pulsatile mass.     Tenderness: There is no abdominal tenderness. There is no guarding or rebound.     Comments: Exam hindered by patient's body habitus  Musculoskeletal:     Right lower leg: No deformity, lacerations, tenderness or bony tenderness. 4+ Edema present.     Left lower leg: No deformity, lacerations, tenderness or  bony tenderness. 4+ Edema present.     Comments: +4 pitting edema to bilateral lower extremities, ankle and feet No tenderness, erythema, or rash to  bilateral lower extremities, ankle, or feet.  Darker discoloration to bilateral ankles and feet. +5 Strength to dorsiflexion and plantar flexion bilaterally.  Patient able to lift and hold each leg against gravity without difficulty.  Feet:     Right foot:     Skin integrity: Callus and dry skin present. No ulcer, blister, skin breakdown, erythema, warmth or fissure.     Toenail Condition: Right toenails are abnormally thick.     Left foot:     Skin integrity: Callus and dry skin present. No ulcer, blister, skin breakdown, erythema, warmth or fissure.     Toenail Condition: Left toenails are abnormally thick.  Skin:    General: Skin is warm and dry.  Neurological:     General: No focal deficit present.     Mental Status: She is alert.  Psychiatric:        Behavior: Behavior is cooperative.     ED Results / Procedures / Treatments   Labs (all labs ordered are listed, but only abnormal results are displayed) Labs Reviewed  COMPREHENSIVE METABOLIC PANEL - Abnormal; Notable for the following components:      Result Value   Glucose, Bld 405 (*)    BUN 31 (*)    Creatinine, Ser 1.18 (*)    Total Protein 6.3 (*)    Total Bilirubin 1.8 (*)    GFR, Estimated 50 (*)    All other components within normal limits  CBC WITH DIFFERENTIAL/PLATELET - Abnormal; Notable for the following components:   Platelets 96 (*)    Neutro Abs 8.6 (*)    Lymphs Abs 0.4 (*)    All other components within normal limits  BETA-HYDROXYBUTYRIC ACID - Abnormal; Notable for the following components:   Beta-Hydroxybutyric Acid 2.84 (*)    All other components within normal limits  BRAIN NATRIURETIC PEPTIDE - Abnormal; Notable for the following components:   B Natriuretic Peptide 100.2 (*)    All other components within normal limits  URINALYSIS, ROUTINE W REFLEX  MICROSCOPIC - Abnormal; Notable for the following components:   Glucose, UA >=500 (*)    Hgb urine dipstick SMALL (*)    Ketones, ur 5 (*)    Leukocytes,Ua TRACE (*)    Bacteria, UA RARE (*)    All other components within normal limits  CBG MONITORING, ED - Abnormal; Notable for the following components:   Glucose-Capillary 398 (*)    All other components within normal limits  URINE CULTURE    EKG EKG Interpretation  Date/Time:  Tuesday May 23 2021 20:34:05 EDT Ventricular Rate:  71 PR Interval:  180 QRS Duration: 123 QT Interval:  408 QTC Calculation: 444 R Axis:   -24 Text Interpretation: Sinus rhythm Probable left atrial enlargement Left ventricular hypertrophy since last tracing no significant change Confirmed by Daleen Bo [phone]) on 05/23/2021 9:23:00 PM   Radiology DG Chest Portable 1 View  Result Date: 05/23/2021 CLINICAL DATA:  Shortness of breath with exertion EXAM: PORTABLE CHEST 1 VIEW COMPARISON:  01/23/2021 FINDINGS: No new consolidation or edema. No pleural effusion. Normal heart size. No acute osseous abnormality. IMPRESSION: No acute process in the chest. Electronically Signed   By: Macy Mis M.D.   On: 05/23/2021 20:43    Procedures Procedures   Medications Ordered in ED Medications  sodium chloride 0.9 % bolus 500 mL (500 mLs Intravenous New Bag/Given 05/23/21 2111)    ED Course  I have reviewed the triage vital signs and the nursing notes.  Pertinent labs &  imaging results that were available during my care of the patient were reviewed by me and considered in my medical decision making (see chart for details).    MDM Rules/Calculators/A&P                          Alert 70 year old female in no acute distress, nontoxic-appearing presents to the emergency department with a chief complaint of edema to bilateral lower extremities/feet and hyperglycemia.  Patient has history of peripheral edema.  Patient is on 40 mg furosemide daily.  Patient was  seen in urgent care yesterday for same complaint of leg swelling and was prescribed additional 20 mg oral furosemide daily for the next 7 days patient was advised to follow-up with her primary care provider.  Patient has +4 pitting edema to bilateral lower extremities, ankle and feet.  No rash, erythema, or tenderness to bilateral lower extremities, ankle, or feet.  +2 dorsalis pedis pulse bilaterally.  Patient able to lift and hold bilateral lower extremities against gravity without difficulty.  +5 strength to dorsiflexion and plantar flexion bilaterally.  Patient endorses shortness of breath with exertion x1 year; no change in the symptom recently.  Patient denies any PND, orthopnea, chest pain or palpitations.  Will obtain chest x-ray, EKG, and BNP to evaluate for possible exacerbation of CHF causing patient's increased edema.  Low suspicion for DVT as patient's swelling is to bilateral lower extremities, equal and bilateral lower extremities, no erythema or tenderness. Chest x-ray shows no active cardiopulmonary disease. EKG shows normal sinus rhythm, left atrial enlargement, LVH. BNP 100.2.   Low suspicion for CHF exacerbation at this time.  Patient's edema likely peripheral edema.  Advised patient to elevate legs, wear compression stockings, and continue Lasix increase as previously prescribed.  Patient given strict instructions to follow-up with cardiologist for further medical management.  Patient also endorses hyperglycemia.  Patient has history of diabetes.  Patient was started on insulin 3 weeks prior however has not been taking this medication due to fear of needle injection.  Patient was on prednisone for Graves' disease.  Patient denies any nausea, vomiting, or abdominal pain.  Will obtain CBC, CMP, beta hydroxybutyric acid, POC CBG to evaluate for possible DKA. POC CBG 398. Beta hydroxybutyric acid 2.84 CMP shows bicarb within normal limits and no anion gap. Low suspicion for DKA at this  time.  We will give patient 500 mL fluid bolus.  Fluid bolus decreased to due to patient's history of CHF.  Patient's hyperglycemia likely due to her diabetes mellitus, prednisone use, and noncompliance with insulin.  We will have patient follow-up with her endocrinologist.  Importance of insulin use was stressed to the patient.  CBC shows platelets decreased at 96.  No signs of leukocytosis or anemia. CMP shows increased BUN at 31 and creatinine at 1.18.  Creatinine is slightly increased from patient's apparent baseline.  Bump in creatinine likely secondary to increased Lasix use.  Patient endorses dysuria.  Will obtain urinalysis and urine culture to evaluate for possible UTI. Urinalysis shows no signs of infection.  Will defer antibiotics for possible UTI infection until urine culture is obtained.  Patient is hemodynamically stable at this time.  Will discharge patient.  Patient given strict return precautions.  Patient expressed understanding of all instructions and is agreeable with this plan.  Patient was discussed with and evaluated by Dr. Eulis Foster.     Final Clinical Impression(s) / ED Diagnoses Final diagnoses:  Peripheral edema  Hyperglycemia  Rx / DC Orders ED Discharge Orders    None       Loni Beckwith, PA-C 05/24/21 0313    Loni Beckwith, PA-C 05/24/21 3225    Daleen Bo, MD 05/24/21 1136

## 2021-05-23 NOTE — ED Triage Notes (Signed)
Pt BIBA from home- pt c/o BL foot pain and pedal edema. EMS reports hyperglycemia at 439; pt recently prescribed insulin, has yet to start taking it.

## 2021-05-24 ENCOUNTER — Telehealth: Payer: Self-pay | Admitting: Cardiovascular Disease

## 2021-05-24 NOTE — Telephone Encounter (Signed)
I tried calling - no answer, mail box is full - could not leave a message. She needs to elevated her legs higher than her heart. She was given additional lasix to take PO If this is not working ( ie no significant urine output despite additional lasix) we should switch to torsemide 20 mg BID  Check bmp in 1-2 weeks .

## 2021-05-24 NOTE — Telephone Encounter (Signed)
RN attempted to contact the patient. No answer at this time, and unable to leave a voicemail.

## 2021-05-24 NOTE — Telephone Encounter (Signed)
Patient states she has been in the hospital this past weekend and the weekend before. She states she has a new geriatric doctor, Dr. Ardean Larsen. She states she thought a message was sent to Dr. Elease Hashimoto to review her hospital visits and her tests, but she has not heard anything back. She states they said all her tests came back fine, but she cannot left her legs. She states she has to pick them up and move them. She would like Dr. Elease Hashimoto to review her tests and her hospital visits and give a recommendation.

## 2021-05-24 NOTE — Telephone Encounter (Signed)
Lets also get an echo.  Her last echo was in 2013. She needs to avoid all salty foods and avoid adding salt to her food.   She might consider getting a Lounge Dr. Leg rest. This will help her with leg elevation  Available through  [employer] and  [email or web address]      Kristeen Miss, MD  05/24/2021 3:58 PM    [facility] [address] [employer],  [address] [place], [place]  [address] Phone: [phone]; Fax: [phone]

## 2021-05-25 LAB — URINE CULTURE

## 2021-05-26 ENCOUNTER — Emergency Department (HOSPITAL_COMMUNITY): Payer: Medicare HMO

## 2021-05-26 ENCOUNTER — Other Ambulatory Visit: Payer: Self-pay

## 2021-05-26 ENCOUNTER — Inpatient Hospital Stay (HOSPITAL_COMMUNITY)
Admission: EM | Admit: 2021-05-26 | Discharge: 2021-06-23 | DRG: 391 | Disposition: A | Discharge status: Skilled Nursing Facility | Payer: Medicare HMO | Attending: Family Medicine | Admitting: Family Medicine

## 2021-05-26 ENCOUNTER — Encounter (HOSPITAL_COMMUNITY): Payer: Self-pay | Admitting: Internal Medicine

## 2021-05-26 DIAGNOSIS — E1122 Type 2 diabetes mellitus with diabetic chronic kidney disease: Secondary | ICD-10-CM | POA: Diagnosis present

## 2021-05-26 DIAGNOSIS — R6889 Other general symptoms and signs: Secondary | ICD-10-CM | POA: Diagnosis not present

## 2021-05-26 DIAGNOSIS — K651 Peritoneal abscess: Secondary | ICD-10-CM | POA: Diagnosis present

## 2021-05-26 DIAGNOSIS — E1142 Type 2 diabetes mellitus with diabetic polyneuropathy: Secondary | ICD-10-CM | POA: Diagnosis not present

## 2021-05-26 DIAGNOSIS — G4733 Obstructive sleep apnea (adult) (pediatric): Secondary | ICD-10-CM | POA: Diagnosis present

## 2021-05-26 DIAGNOSIS — E1165 Type 2 diabetes mellitus with hyperglycemia: Secondary | ICD-10-CM | POA: Diagnosis not present

## 2021-05-26 DIAGNOSIS — J4540 Moderate persistent asthma, uncomplicated: Secondary | ICD-10-CM | POA: Diagnosis present

## 2021-05-26 DIAGNOSIS — K5780 Diverticulitis of intestine, part unspecified, with perforation and abscess without bleeding: Secondary | ICD-10-CM | POA: Diagnosis not present

## 2021-05-26 DIAGNOSIS — E039 Hypothyroidism, unspecified: Secondary | ICD-10-CM | POA: Diagnosis present

## 2021-05-26 DIAGNOSIS — I700 Atherosclerosis of aorta: Secondary | ICD-10-CM | POA: Diagnosis present

## 2021-05-26 DIAGNOSIS — T383X6A Underdosing of insulin and oral hypoglycemic [antidiabetic] drugs, initial encounter: Secondary | ICD-10-CM | POA: Diagnosis present

## 2021-05-26 DIAGNOSIS — I5032 Chronic diastolic (congestive) heart failure: Secondary | ICD-10-CM | POA: Diagnosis not present

## 2021-05-26 DIAGNOSIS — N1831 Chronic kidney disease, stage 3a: Secondary | ICD-10-CM

## 2021-05-26 DIAGNOSIS — Z8349 Family history of other endocrine, nutritional and metabolic diseases: Secondary | ICD-10-CM

## 2021-05-26 DIAGNOSIS — E876 Hypokalemia: Secondary | ICD-10-CM | POA: Diagnosis present

## 2021-05-26 DIAGNOSIS — R1084 Generalized abdominal pain: Secondary | ICD-10-CM | POA: Diagnosis not present

## 2021-05-26 DIAGNOSIS — N179 Acute kidney failure, unspecified: Secondary | ICD-10-CM | POA: Diagnosis not present

## 2021-05-26 DIAGNOSIS — K6389 Other specified diseases of intestine: Secondary | ICD-10-CM | POA: Diagnosis not present

## 2021-05-26 DIAGNOSIS — R0600 Dyspnea, unspecified: Secondary | ICD-10-CM | POA: Diagnosis not present

## 2021-05-26 DIAGNOSIS — D6959 Other secondary thrombocytopenia: Secondary | ICD-10-CM | POA: Diagnosis not present

## 2021-05-26 DIAGNOSIS — L02211 Cutaneous abscess of abdominal wall: Secondary | ICD-10-CM | POA: Diagnosis not present

## 2021-05-26 DIAGNOSIS — Z79899 Other long term (current) drug therapy: Secondary | ICD-10-CM

## 2021-05-26 DIAGNOSIS — Z743 Need for continuous supervision: Secondary | ICD-10-CM | POA: Diagnosis not present

## 2021-05-26 DIAGNOSIS — K922 Gastrointestinal hemorrhage, unspecified: Secondary | ICD-10-CM | POA: Diagnosis not present

## 2021-05-26 DIAGNOSIS — Z833 Family history of diabetes mellitus: Secondary | ICD-10-CM

## 2021-05-26 DIAGNOSIS — I82432 Acute embolism and thrombosis of left popliteal vein: Secondary | ICD-10-CM | POA: Diagnosis not present

## 2021-05-26 DIAGNOSIS — Z872 Personal history of diseases of the skin and subcutaneous tissue: Secondary | ICD-10-CM | POA: Diagnosis not present

## 2021-05-26 DIAGNOSIS — I351 Nonrheumatic aortic (valve) insufficiency: Secondary | ICD-10-CM | POA: Diagnosis not present

## 2021-05-26 DIAGNOSIS — M6281 Muscle weakness (generalized): Secondary | ICD-10-CM | POA: Diagnosis not present

## 2021-05-26 DIAGNOSIS — K5720 Diverticulitis of large intestine with perforation and abscess without bleeding: Secondary | ICD-10-CM | POA: Diagnosis not present

## 2021-05-26 DIAGNOSIS — K2991 Gastroduodenitis, unspecified, with bleeding: Secondary | ICD-10-CM | POA: Diagnosis not present

## 2021-05-26 DIAGNOSIS — J9601 Acute respiratory failure with hypoxia: Secondary | ICD-10-CM | POA: Diagnosis not present

## 2021-05-26 DIAGNOSIS — R10817 Generalized abdominal tenderness: Secondary | ICD-10-CM | POA: Diagnosis not present

## 2021-05-26 DIAGNOSIS — Z7984 Long term (current) use of oral hypoglycemic drugs: Secondary | ICD-10-CM | POA: Diagnosis not present

## 2021-05-26 DIAGNOSIS — R531 Weakness: Secondary | ICD-10-CM | POA: Diagnosis not present

## 2021-05-26 DIAGNOSIS — K921 Melena: Secondary | ICD-10-CM | POA: Diagnosis not present

## 2021-05-26 DIAGNOSIS — R1110 Vomiting, unspecified: Secondary | ICD-10-CM | POA: Diagnosis not present

## 2021-05-26 DIAGNOSIS — I2699 Other pulmonary embolism without acute cor pulmonale: Secondary | ICD-10-CM | POA: Diagnosis present

## 2021-05-26 DIAGNOSIS — K2980 Duodenitis without bleeding: Secondary | ICD-10-CM | POA: Diagnosis not present

## 2021-05-26 DIAGNOSIS — R2681 Unsteadiness on feet: Secondary | ICD-10-CM | POA: Diagnosis not present

## 2021-05-26 DIAGNOSIS — G9341 Metabolic encephalopathy: Secondary | ICD-10-CM | POA: Diagnosis not present

## 2021-05-26 DIAGNOSIS — Z7952 Long term (current) use of systemic steroids: Secondary | ICD-10-CM

## 2021-05-26 DIAGNOSIS — I130 Hypertensive heart and chronic kidney disease with heart failure and stage 1 through stage 4 chronic kidney disease, or unspecified chronic kidney disease: Secondary | ICD-10-CM | POA: Diagnosis present

## 2021-05-26 DIAGNOSIS — N739 Female pelvic inflammatory disease, unspecified: Secondary | ICD-10-CM | POA: Diagnosis not present

## 2021-05-26 DIAGNOSIS — N189 Chronic kidney disease, unspecified: Secondary | ICD-10-CM | POA: Diagnosis not present

## 2021-05-26 DIAGNOSIS — E11649 Type 2 diabetes mellitus with hypoglycemia without coma: Secondary | ICD-10-CM | POA: Diagnosis not present

## 2021-05-26 DIAGNOSIS — K5721 Diverticulitis of large intestine with perforation and abscess with bleeding: Secondary | ICD-10-CM | POA: Diagnosis not present

## 2021-05-26 DIAGNOSIS — E119 Type 2 diabetes mellitus without complications: Secondary | ICD-10-CM

## 2021-05-26 DIAGNOSIS — K900 Celiac disease: Secondary | ICD-10-CM | POA: Diagnosis not present

## 2021-05-26 DIAGNOSIS — I959 Hypotension, unspecified: Secondary | ICD-10-CM | POA: Diagnosis not present

## 2021-05-26 DIAGNOSIS — J439 Emphysema, unspecified: Secondary | ICD-10-CM | POA: Diagnosis not present

## 2021-05-26 DIAGNOSIS — E559 Vitamin D deficiency, unspecified: Secondary | ICD-10-CM | POA: Diagnosis present

## 2021-05-26 DIAGNOSIS — Z978 Presence of other specified devices: Secondary | ICD-10-CM | POA: Diagnosis not present

## 2021-05-26 DIAGNOSIS — E118 Type 2 diabetes mellitus with unspecified complications: Secondary | ICD-10-CM | POA: Diagnosis not present

## 2021-05-26 DIAGNOSIS — Z20822 Contact with and (suspected) exposure to covid-19: Secondary | ICD-10-CM | POA: Diagnosis not present

## 2021-05-26 DIAGNOSIS — R488 Other symbolic dysfunctions: Secondary | ICD-10-CM | POA: Diagnosis not present

## 2021-05-26 DIAGNOSIS — I82409 Acute embolism and thrombosis of unspecified deep veins of unspecified lower extremity: Secondary | ICD-10-CM | POA: Diagnosis not present

## 2021-05-26 DIAGNOSIS — R059 Cough, unspecified: Secondary | ICD-10-CM | POA: Diagnosis not present

## 2021-05-26 DIAGNOSIS — R569 Unspecified convulsions: Secondary | ICD-10-CM | POA: Diagnosis not present

## 2021-05-26 DIAGNOSIS — E785 Hyperlipidemia, unspecified: Secondary | ICD-10-CM | POA: Diagnosis present

## 2021-05-26 DIAGNOSIS — R109 Unspecified abdominal pain: Secondary | ICD-10-CM | POA: Diagnosis not present

## 2021-05-26 DIAGNOSIS — Z8249 Family history of ischemic heart disease and other diseases of the circulatory system: Secondary | ICD-10-CM

## 2021-05-26 DIAGNOSIS — K3189 Other diseases of stomach and duodenum: Secondary | ICD-10-CM | POA: Diagnosis not present

## 2021-05-26 DIAGNOSIS — K5730 Diverticulosis of large intestine without perforation or abscess without bleeding: Secondary | ICD-10-CM | POA: Diagnosis not present

## 2021-05-26 DIAGNOSIS — R300 Dysuria: Secondary | ICD-10-CM | POA: Diagnosis present

## 2021-05-26 DIAGNOSIS — E05 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: Secondary | ICD-10-CM | POA: Diagnosis present

## 2021-05-26 DIAGNOSIS — Z7401 Bed confinement status: Secondary | ICD-10-CM | POA: Diagnosis not present

## 2021-05-26 DIAGNOSIS — B952 Enterococcus as the cause of diseases classified elsewhere: Secondary | ICD-10-CM | POA: Diagnosis present

## 2021-05-26 DIAGNOSIS — K5792 Diverticulitis of intestine, part unspecified, without perforation or abscess without bleeding: Secondary | ICD-10-CM

## 2021-05-26 DIAGNOSIS — I10 Essential (primary) hypertension: Secondary | ICD-10-CM | POA: Diagnosis not present

## 2021-05-26 DIAGNOSIS — D62 Acute posthemorrhagic anemia: Secondary | ICD-10-CM | POA: Diagnosis not present

## 2021-05-26 DIAGNOSIS — Z6841 Body Mass Index (BMI) 40.0 and over, adult: Secondary | ICD-10-CM

## 2021-05-26 DIAGNOSIS — B965 Pseudomonas (aeruginosa) (mallei) (pseudomallei) as the cause of diseases classified elsewhere: Secondary | ICD-10-CM | POA: Diagnosis present

## 2021-05-26 DIAGNOSIS — D509 Iron deficiency anemia, unspecified: Secondary | ICD-10-CM | POA: Diagnosis not present

## 2021-05-26 DIAGNOSIS — Z79890 Hormone replacement therapy (postmenopausal): Secondary | ICD-10-CM

## 2021-05-26 DIAGNOSIS — L0291 Cutaneous abscess, unspecified: Secondary | ICD-10-CM

## 2021-05-26 DIAGNOSIS — R41841 Cognitive communication deficit: Secondary | ICD-10-CM | POA: Diagnosis not present

## 2021-05-26 DIAGNOSIS — I82442 Acute embolism and thrombosis of left tibial vein: Secondary | ICD-10-CM | POA: Diagnosis not present

## 2021-05-26 DIAGNOSIS — K920 Hematemesis: Secondary | ICD-10-CM | POA: Diagnosis not present

## 2021-05-26 DIAGNOSIS — K2990 Gastroduodenitis, unspecified, without bleeding: Secondary | ICD-10-CM | POA: Diagnosis not present

## 2021-05-26 DIAGNOSIS — Z8673 Personal history of transient ischemic attack (TIA), and cerebral infarction without residual deficits: Secondary | ICD-10-CM

## 2021-05-26 DIAGNOSIS — R0902 Hypoxemia: Secondary | ICD-10-CM | POA: Diagnosis not present

## 2021-05-26 DIAGNOSIS — K219 Gastro-esophageal reflux disease without esophagitis: Secondary | ICD-10-CM | POA: Diagnosis present

## 2021-05-26 DIAGNOSIS — R5381 Other malaise: Secondary | ICD-10-CM | POA: Diagnosis not present

## 2021-05-26 DIAGNOSIS — R2689 Other abnormalities of gait and mobility: Secondary | ICD-10-CM | POA: Diagnosis not present

## 2021-05-26 DIAGNOSIS — E872 Acidosis: Secondary | ICD-10-CM | POA: Diagnosis not present

## 2021-05-26 LAB — CBC WITH DIFFERENTIAL/PLATELET
Abs Immature Granulocytes: 0 10*3/uL (ref 0.00–0.07)
Basophils Absolute: 0 10*3/uL (ref 0.0–0.1)
Basophils Relative: 0 %
Eosinophils Absolute: 0.1 10*3/uL (ref 0.0–0.5)
Eosinophils Relative: 1 %
HCT: 41 % (ref 36.0–46.0)
Hemoglobin: 13.1 g/dL (ref 12.0–15.0)
Lymphocytes Relative: 5 %
Lymphs Abs: 0.4 10*3/uL — ABNORMAL LOW (ref 0.7–4.0)
MCH: 31 pg (ref 26.0–34.0)
MCHC: 32 g/dL (ref 30.0–36.0)
MCV: 97.2 fL (ref 80.0–100.0)
Monocytes Absolute: 0.2 10*3/uL (ref 0.1–1.0)
Monocytes Relative: 2 %
Neutro Abs: 7.5 10*3/uL (ref 1.7–7.7)
Neutrophils Relative %: 92 %
Platelets: UNDETERMINED 10*3/uL (ref 150–400)
RBC: 4.22 MIL/uL (ref 3.87–5.11)
RDW: 14.6 % (ref 11.5–15.5)
WBC: 8.2 10*3/uL (ref 4.0–10.5)
nRBC: 0 % (ref 0.0–0.2)
nRBC: 1 /100 WBC — ABNORMAL HIGH

## 2021-05-26 LAB — COMPREHENSIVE METABOLIC PANEL
ALT: 26 U/L (ref 0–44)
AST: 18 U/L (ref 15–41)
Albumin: 2.8 g/dL — ABNORMAL LOW (ref 3.5–5.0)
Alkaline Phosphatase: 61 U/L (ref 38–126)
Anion gap: 12 (ref 5–15)
BUN: 23 mg/dL (ref 8–23)
CO2: 24 mmol/L (ref 22–32)
Calcium: 8.9 mg/dL (ref 8.9–10.3)
Chloride: 98 mmol/L (ref 98–111)
Creatinine, Ser: 1.28 mg/dL — ABNORMAL HIGH (ref 0.44–1.00)
GFR, Estimated: 45 mL/min — ABNORMAL LOW (ref >60)
Glucose, Bld: 287 mg/dL — ABNORMAL HIGH (ref 70–99)
Potassium: 2.8 mmol/L — ABNORMAL LOW (ref 3.5–5.1)
Sodium: 134 mmol/L — ABNORMAL LOW (ref 135–145)
Total Bilirubin: 1.2 mg/dL (ref 0.3–1.2)
Total Protein: 5.5 g/dL — ABNORMAL LOW (ref 6.5–8.1)

## 2021-05-26 LAB — URINALYSIS, ROUTINE W REFLEX MICROSCOPIC
Bilirubin Urine: NEGATIVE
Glucose, UA: 50 mg/dL — AB
Ketones, ur: NEGATIVE mg/dL
Nitrite: NEGATIVE
Protein, ur: NEGATIVE mg/dL
Specific Gravity, Urine: 1.017 (ref 1.005–1.030)
pH: 5 (ref 5.0–8.0)

## 2021-05-26 LAB — MAGNESIUM: Magnesium: 1.4 mg/dL — ABNORMAL LOW (ref 1.7–2.4)

## 2021-05-26 LAB — RESP PANEL BY RT-PCR (FLU A&B, COVID) ARPGX2
Influenza A by PCR: NEGATIVE
Influenza B by PCR: NEGATIVE
SARS Coronavirus 2 by RT PCR: NEGATIVE

## 2021-05-26 LAB — LIPASE, BLOOD: Lipase: 19 U/L (ref 11–51)

## 2021-05-26 MED ORDER — ONDANSETRON HCL 4 MG/2ML IJ SOLN
4.0000 mg | Freq: Once | INTRAMUSCULAR | Status: AC
  Administered 2021-05-26: 4 mg via INTRAVENOUS
  Filled 2021-05-26: qty 2

## 2021-05-26 MED ORDER — SODIUM CHLORIDE 0.9 % IV SOLN
2.0000 g | Freq: Once | INTRAVENOUS | Status: AC
  Administered 2021-05-26: 2 g via INTRAVENOUS
  Filled 2021-05-26: qty 2

## 2021-05-26 MED ORDER — SODIUM CHLORIDE 0.9 % IV BOLUS
1000.0000 mL | Freq: Once | INTRAVENOUS | Status: AC
  Administered 2021-05-26: 1000 mL via INTRAVENOUS

## 2021-05-26 MED ORDER — INSULIN ASPART 100 UNIT/ML IJ SOLN
0.0000 [IU] | INTRAMUSCULAR | Status: DC
  Administered 2021-05-27: 1 [IU] via SUBCUTANEOUS
  Administered 2021-05-27: 2 [IU] via SUBCUTANEOUS
  Administered 2021-05-27: 3 [IU] via SUBCUTANEOUS
  Administered 2021-05-27 (×2): 2 [IU] via SUBCUTANEOUS
  Administered 2021-05-28 (×2): 1 [IU] via SUBCUTANEOUS
  Administered 2021-05-28: 3 [IU] via SUBCUTANEOUS
  Administered 2021-05-28: 1 [IU] via SUBCUTANEOUS
  Administered 2021-05-28: 5 [IU] via SUBCUTANEOUS
  Administered 2021-05-29: 3 [IU] via SUBCUTANEOUS
  Administered 2021-05-29: 7 [IU] via SUBCUTANEOUS
  Administered 2021-05-30: 3 [IU] via SUBCUTANEOUS

## 2021-05-26 MED ORDER — IOHEXOL 300 MG/ML  SOLN
80.0000 mL | Freq: Once | INTRAMUSCULAR | Status: AC | PRN
  Administered 2021-05-26: 80 mL via INTRAVENOUS

## 2021-05-26 MED ORDER — METRONIDAZOLE 500 MG/100ML IV SOLN
500.0000 mg | Freq: Once | INTRAVENOUS | Status: AC
Start: 2021-05-26 — End: 2021-05-26
  Administered 2021-05-26: 500 mg via INTRAVENOUS
  Filled 2021-05-26: qty 100

## 2021-05-26 MED ORDER — POTASSIUM CHLORIDE 10 MEQ/100ML IV SOLN
10.0000 meq | INTRAVENOUS | Status: DC
  Administered 2021-05-26: 10 meq via INTRAVENOUS
  Filled 2021-05-26 (×2): qty 100

## 2021-05-26 MED ORDER — HEPARIN BOLUS VIA INFUSION
5000.0000 [IU] | Freq: Once | INTRAVENOUS | Status: AC
  Administered 2021-05-26: 5000 [IU] via INTRAVENOUS
  Filled 2021-05-26: qty 5000

## 2021-05-26 MED ORDER — PIPERACILLIN-TAZOBACTAM 3.375 G IVPB
3.3750 g | Freq: Three times a day (TID) | INTRAVENOUS | Status: DC
  Administered 2021-05-27 – 2021-06-17 (×63): 3.375 g via INTRAVENOUS
  Filled 2021-05-26 (×65): qty 50

## 2021-05-26 MED ORDER — MAGNESIUM SULFATE 2 GM/50ML IV SOLN
2.0000 g | Freq: Once | INTRAVENOUS | Status: AC
  Administered 2021-05-26: 2 g via INTRAVENOUS
  Filled 2021-05-26: qty 50

## 2021-05-26 MED ORDER — HEPARIN (PORCINE) 25000 UT/250ML-% IV SOLN
1950.0000 [IU]/h | INTRAVENOUS | Status: DC
  Administered 2021-05-26: 1950 [IU]/h via INTRAVENOUS
  Filled 2021-05-26: qty 250

## 2021-05-26 MED ORDER — HYDROMORPHONE HCL 1 MG/ML IJ SOLN
0.5000 mg | Freq: Once | INTRAMUSCULAR | Status: AC
  Administered 2021-05-26: 0.5 mg via INTRAVENOUS
  Filled 2021-05-26: qty 1

## 2021-05-26 NOTE — ED Provider Notes (Addendum)
[facility] EMERGENCY DEPARTMENT Provider Note   CSN: [id number] Arrival date & time: 05/26/21  1533     History Chief Complaint  Patient presents with   Abdominal Pain    [name] is a 70 y.o. female.  Patient c/o nausea, diarrhea and abd pain in past week. Symptoms acute onset, moderate, constant, persistent, not localized, not radiating. Diarrhea episodic, loose to watery, several. Denies known ill contacts or bad food ingestion. No recent abx use. Denies prior abd surgery. No abd distension. Denies cough or sore throat. No headache. No chest pain or discomfort. No sob. Mild dysuria. No vaginal discharge or bleeding. States saw pcp today and was sent to ED.   The history is provided by the patient and a relative.  Abdominal Pain Associated symptoms: diarrhea, dysuria and nausea   Associated symptoms: no chest pain, no cough, no fever, no shortness of breath and no sore throat       Past Medical History:  Diagnosis Date   Anemia    Celiac disease    CHF (congestive heart failure) (HCC)    Depression    Diabetes mellitus without complication ([place])    Edema    lower extremities   Emphysema of lung ([place])    Enlarged heart    begining stage   Graves disease    Headache(784.0) 05/18/2013   Heart murmur    Hematochezia    Hemorrhoid    Hyperlipidemia    Hypertension    Migraine    Obese    OSA (obstructive sleep apnea)    Seizures ([place])    Sleep apnea    Stroke ([place])    left P-O ICH 2007   Thyroid disease    hypothyroidism   Ulcer    Vitamin D deficiency     Patient Active Problem List   Diagnosis Date Noted   Cardiac arrhythmia 05/23/2021   Chronic kidney disease due to hypertension 05/23/2021   Chronic kidney disease, stage 3a ([place]) 05/23/2021   Diabetic renal disease ([place]) 05/23/2021   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   Gastroesophageal reflux disease 05/23/2021   Hyperlipidemia 05/23/2021   Moderate persistent asthma,  uncomplicated 70/96/2836   Stroke ([place]) 05/23/2021   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 05/23/2021   Morbid obesity ([place]) 11/07/2020   Optic neuropathy, compressive 08/24/2020   Thyroid eye disease 08/04/2020   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 ([place]) 04/09/2020   Hypertensive retinopathy of both eyes 04/09/2020   Anemia 08/07/2017   Celiac disease 08/07/2017   Graves' disease with exophthalmos 08/07/2017   History of depression 62/94/7654   Diastolic dysfunction 65/02/5464   Headache(784.0) 05/18/2013   Chronic diastolic CHF (congestive heart failure) ([place]) 12/04/2011   Hemorrhoids, internal, with bleeding 11/28/2011   Seizure ([place]) 06/30/2010    Past Surgical History:  Procedure Laterality Date   CESAREAN SECTION     HEMORRHOID SURGERY     IUD REMOVAL     TONSILLECTOMY     VEIN SURGERY       OB History   No obstetric history on file.     Family History  Problem Relation Age of Onset   Heart disease Father    Diabetes Father    Heart failure Father    Diabetes Mother    Thyroid disease Sister    Diabetes Brother     Social History   Tobacco Use   Smoking status: Never   Smokeless tobacco: Never  Vaping Use  Vaping Use: Never used  Substance Use Topics   Alcohol use: No   Drug use: No    Home Medications Prior to Admission medications   Medication Sig Start Date End Date Taking? Authorizing Provider  albuterol (PROAIR HFA) 108 (90 Base) MCG/ACT inhaler Inhale 2 puffs into the lungs every 6 (six) hours as needed for wheezing or shortness of breath.    [provider]  amLODipine (NORVASC) 5 MG tablet Take 1 tablet (5 mg total) by mouth daily. 10/04/20   Nahser, Wonda Cheng, MD  Blood Glucose Monitoring Suppl [facility] VERIO REFLECT) w/Device KIT 1 each by Other route as directed. 08/10/20   [provider]  carvedilol (COREG) 25 MG tablet Take 1 tablet (25 mg total) by mouth 2 (two) times  daily with a meal. 12/05/12   Nahser, Wonda Cheng, MD  Cholecalciferol (VITAMIN D3) 125 MCG (5000 UT) CAPS Take 10,000 tablets by mouth daily.     [provider]  Cyanocobalamin (VITAMIN B-12 CR PO) Take 2 tablets by mouth daily. 1066mg    [provider]  esomeprazole (NEXIUM) 20 MG capsule Take 1 capsule by mouth daily.    [provider]  Fluticasone-Salmeterol (ADVAIR DISKUS) 250-50 MCG/DOSE AEPB Inhale 1 puff into the lungs daily as needed for allergies.    [provider]  furosemide (LASIX) 20 MG tablet Take 1 tablet (20 mg total) by mouth daily. 05/22/21   WHans Eden NP  furosemide (LASIX) 40 MG tablet Take 1 tablet (40 mg total) by mouth daily as needed for fluid or edema. 11/08/20   Nahser, PWonda Cheng MD  glimepiride (AMARYL) 4 MG tablet Take 4 mg by mouth daily with breakfast.    [provider]  glucose blood (ONETOUCH VERIO) test strip Use as intstructed to check blood sugar once a day Dx Code E11.9 02/18/21   KElayne Snare MD  Lancets ([facility]) lancets Use as instructed 10/13/20   KElayne Snare MD  levothyroxine (SYNTHROID) 112 MCG tablet Take 1 tablet by mouth Monday through Saturday and 1/2 tablet on Sunday. 11/08/20   KElayne Snare MD  meclizine (ANTIVERT) 25 MG tablet Take 1 tablet by mouth as needed.    [provider]  metFORMIN (GLUCOPHAGE) 500 MG tablet Take 500 mg by mouth 2 (two) times daily. 08/23/20   [provider]  methocarbamol (ROBAXIN) 750 MG tablet Take 1 tablet (750 mg total) by mouth 3 (three) times daily as needed (muscle spasm/pain). 03/26/21   SLajean Saver MD  potassium chloride (KLOR-CON) 10 MEQ tablet Take 2 tablets (20 mEq total) by mouth daily. 11/07/20   Nahser, PWonda Cheng MD  predniSONE (DELTASONE) 10 MG tablet Take 40 mg by mouth as directed. Pt is tapering off, take as prescribed 10/12/20   [provider]  rosuvastatin (CRESTOR) 5 MG tablet Take 1 tablet (5 mg total) by mouth  daily. 11/08/20   Nahser, PWonda Cheng MD    Allergies    Gluten meal  Review of Systems   Review of Systems  Constitutional:  Negative for fever.  HENT:  Negative for sore throat.   Eyes:  Negative for redness.  Respiratory:  Negative for cough and shortness of breath.   Cardiovascular:  Negative for chest pain.  Gastrointestinal:  Positive for abdominal pain, diarrhea and nausea.  Genitourinary:  Positive for dysuria. Negative for flank pain.  Musculoskeletal:  Negative for back pain, neck pain and neck stiffness.  Skin:  Negative for rash.  Neurological:  Negative for headaches.  Hematological:  Does not bruise/bleed easily.  Psychiatric/Behavioral:  Negative for confusion.    Physical Exam Updated Vital Signs BP 92/75 (BP Location: Left Arm)   Pulse (!) 103   Temp 98.9 F (37.2 C) (Oral)   Resp (!) 22   SpO2 94%   Physical Exam Vitals and nursing note reviewed.  Constitutional:      Appearance: Normal appearance. She is well-developed.  HENT:     Head: Atraumatic.     Nose: Nose normal.     Mouth/Throat:     Mouth: Mucous membranes are moist.  Eyes:     General: No scleral icterus.    Conjunctiva/sclera: Conjunctivae normal.     Pupils: Pupils are equal, round, and reactive to light.  Neck:     Trachea: No tracheal deviation.     Comments: No stiffness or rigidity.  Cardiovascular:     Rate and Rhythm: Normal rate and regular rhythm.     Pulses: Normal pulses.     Heart sounds: Normal heart sounds. No murmur heard.   No friction rub. No gallop.  Pulmonary:     Effort: Pulmonary effort is normal. No respiratory distress.     Breath sounds: Normal breath sounds.  Abdominal:     General: Bowel sounds are normal. There is no distension.     Palpations: Abdomen is soft. There is no mass.     Tenderness: There is abdominal tenderness. There is no guarding or rebound.     Hernia: No hernia is present.     Comments: Mid/diffuse abd tenderness.   Genitourinary:     Comments: No cva tenderness.  Musculoskeletal:        General: No tenderness.     Cervical back: Normal range of motion and neck supple. No rigidity. No muscular tenderness.     Comments: Mild symmetric lower leg/ankle edema bilaterally.   Skin:    General: Skin is warm and dry.     Findings: No rash.  Neurological:     Mental Status: She is alert.     Comments: Alert, speech normal.   Psychiatric:        Mood and Affect: Mood normal.    ED Results / Procedures / Treatments   Labs (all labs ordered are listed, but only abnormal results are displayed) Results for orders placed or performed during the hospital encounter of 05/26/21  Resp Panel by RT-PCR (Flu A&B, Covid) Nasopharyngeal Swab   Specimen: Nasopharyngeal Swab; Nasopharyngeal(NP) swabs in vial transport medium  Result Value Ref Range   SARS Coronavirus 2 by RT PCR NEGATIVE NEGATIVE   Influenza A by PCR NEGATIVE NEGATIVE   Influenza B by PCR NEGATIVE NEGATIVE  Comprehensive metabolic panel  Result Value Ref Range   Sodium 134 (L) 135 - 145 mmol/L   Potassium 2.8 (L) 3.5 - 5.1 mmol/L   Chloride 98 98 - 111 mmol/L   CO2 24 22 - 32 mmol/L   Glucose, Bld 287 (H) 70 - 99 mg/dL   BUN 23 8 - 23 mg/dL   Creatinine, Ser 1.28 (H) 0.44 - 1.00 mg/dL   Calcium 8.9 8.9 - 10.3 mg/dL   Total Protein 5.5 (L) 6.5 - 8.1 g/dL   Albumin 2.8 (L) 3.5 - 5.0 g/dL   AST 18 15 - 41 U/L   ALT 26 0 - 44 U/L   Alkaline Phosphatase 61 38 - 126 U/L   Total Bilirubin 1.2 0.3 - 1.2 mg/dL   GFR,  Estimated 45 (L) >60 mL/min   Anion gap 12 5 - 15  Lipase, blood  Result Value Ref Range   Lipase 19 11 - 51 U/L  CBC with Differential  Result Value Ref Range   WBC 8.2 4.0 - 10.5 K/uL   RBC 4.22 3.87 - 5.11 MIL/uL   Hemoglobin 13.1 12.0 - 15.0 g/dL   HCT 41.0 36.0 - 46.0 %   MCV 97.2 80.0 - 100.0 fL   MCH 31.0 26.0 - 34.0 pg   MCHC 32.0 30.0 - 36.0 g/dL   RDW 14.6 11.5 - 15.5 %   Platelets PLATELET CLUMPS NOTED ON SMEAR, UNABLE TO ESTIMATE 150  - 400 K/uL   nRBC 0.0 0.0 - 0.2 %   Neutrophils Relative % 92 %   Neutro Abs 7.5 1.7 - 7.7 K/uL   Lymphocytes Relative 5 %   Lymphs Abs 0.4 (L) 0.7 - 4.0 K/uL   Monocytes Relative 2 %   Monocytes Absolute 0.2 0.1 - 1.0 K/uL   Eosinophils Relative 1 %   Eosinophils Absolute 0.1 0.0 - 0.5 K/uL   Basophils Relative 0 %   Basophils Absolute 0.0 0.0 - 0.1 K/uL   nRBC 1 (H) 0 /100 WBC   Abs Immature Granulocytes 0.00 0.00 - 0.07 K/uL   DG Chest 2 View  Result Date: 05/26/2021 CLINICAL DATA:  Hypoxia. EXAM: CHEST - 2 VIEW COMPARISON:  May 23, 2021. FINDINGS: The heart size and mediastinal contours are within normal limits. Both lungs are clear. The visualized skeletal structures are unremarkable. IMPRESSION: No active cardiopulmonary disease. Electronically Signed   By: Marijo Conception M.D.   On: 05/26/2021 16:48   DG Chest Portable 1 View  Result Date: 05/23/2021 CLINICAL DATA:  Shortness of breath with exertion EXAM: PORTABLE CHEST 1 VIEW COMPARISON:  01/23/2021 FINDINGS: No new consolidation or edema. No pleural effusion. Normal heart size. No acute osseous abnormality. IMPRESSION: No acute process in the chest. Electronically Signed   By: Macy Mis M.D.   On: 05/23/2021 20:43    EKG EKG Interpretation  Date/Time:  Friday May 26 2021 15:40:02 EDT Ventricular Rate:  99 PR Interval:  146 QRS Duration: 120 QT Interval:  350 QTC Calculation: 449 R Axis:   -37 Text Interpretation: Sinus rhythm with Premature atrial complexes Left axis deviation Left ventricular hypertrophy with QRS widening and repolarization abnormality Confirmed by Lajean Saver [phone]) on 05/26/2021 8:30:53 PM  Radiology DG Chest 2 View  Result Date: 05/26/2021 CLINICAL DATA:  Hypoxia. EXAM: CHEST - 2 VIEW COMPARISON:  May 23, 2021. FINDINGS: The heart size and mediastinal contours are within normal limits. Both lungs are clear. The visualized skeletal structures are unremarkable. IMPRESSION: No active  cardiopulmonary disease. Electronically Signed   By: Marijo Conception M.D.   On: 05/26/2021 16:48   CT Abdomen Pelvis W Contrast  Result Date: 05/26/2021 CLINICAL DATA:  Two week history of abdominal pain, nausea, vomiting and diarrhea. Abdominal tenderness and hypotensive. EXAM: CT ABDOMEN AND PELVIS WITH CONTRAST TECHNIQUE: Multidetector CT imaging of the abdomen and pelvis was performed using the standard protocol following bolus administration of intravenous contrast. CONTRAST:  69m OMNIPAQUE IOHEXOL 300 MG/ML  SOLN COMPARISON:  None. FINDINGS: Lower chest: The lung bases are clear of an acute process. No pleural effusions or pulmonary lesions. Findings of bilateral central and lower lobe pulmonary emboli are noted. A dedicated CT chest may be helpful for further evaluation but there does appear to be flattening of the left  ventricle which could suggest right heart strain. Hepatobiliary: No hepatic lesions or intrahepatic biliary dilatation. The gallbladder is grossly normal. No common bile duct dilatation. Pancreas: No mass, inflammation or ductal dilatation. Spleen: Normal size. No focal lesions. Adrenals/Urinary Tract: Adrenal glands and kidneys are unremarkable. The bladder is unremarkable. Stomach/Bowel: The stomach, duodenum and small bowel are unremarkable. No findings for small bowel obstruction. There are some mildly inflamed distal ileal loops of small bowel in the pelvis due to a large pelvic abscess. The terminal ileum is normal. The appendix is. There is sigmoid colon diverticulosis and I suspect there is perforated diverticulitis involving the mid sigmoid colon. There is a large complex abscess anterior to the uterus and in between distal small bowel loops. This measures a maximum of 9.2 x 5.8 cm. There is also some associated free pelvic fluid. Vascular/Lymphatic: Moderate scattered atherosclerotic changes involving the aorta and iliac arteries. No aneurysm. The major venous structures are  patent. No mesenteric or retroperitoneal mass or adenopathy. No pelvic adenopathy or inguinal adenopathy. Reproductive: The uterus and ovaries are unremarkable. There are calcifications associated with both ovaries. Other: Small amount of free pelvic fluid. Musculoskeletal: No significant bony findings. IMPRESSION: 1. CT findings consistent with perforated diverticulitis involving the mid sigmoid colon with a large (9.2 x 5.8 cm) complex abscess anterior to the uterus and in between distal small bowel loops. 2. Bilateral central and lower lobe pulmonary emboli. Dedicated CTA chest for further evaluation may be helpful if clinically necessary. There is flattening of the left ventricle which suggesting right heart strain. These results were called by telephone at the time of interpretation on 05/26/2021 at 8:05 pm to provider [facility] , who verbally acknowledged these results. Aortic Atherosclerosis (ICD10-I70.0). Electronically Signed   By: Marijo Sanes M.D.   On: 05/26/2021 20:05    Procedures Procedures   Medications Ordered in ED Medications  sodium chloride 0.9 % bolus 1,000 mL (has no administration in time range)  HYDROmorphone (DILAUDID) injection 0.5 mg (has no administration in time range)  ondansetron (ZOFRAN) injection 4 mg (has no administration in time range)    ED Course  I have reviewed the triage vital signs and the nursing notes.  Pertinent labs & imaging results that were available during my care of the patient were reviewed by me and considered in my medical decision making (see chart for details).    MDM Rules/Calculators/A&P                         Iv ns bolus. Dilaudid iv. Zofran iv. Labs and imaging.   Initial bp soft, ns boluses.   Reviewed nursing notes and prior charts for additional history. Recent labs reviewed.   Labs reviewed/interpreted by me - glucose elevated.   CT reviewed/interpreted by me - perforated diverticulitis w abscess.   Cefepime and flagyl  iv.   Radiologist also called to say definite bilateral PEs on CT study.   Heparin per pharmacy.  General surgery consulted. Discussed w Dr Donne Hazel - he will see, but indicates iv abx, medicine admission, admitted team can consult IR for possible drain.   Medicine consulted for admission.  Discussed pt with Dr Hal Hope - will admit.   CRITICAL CARE RE: perforated diverticulitis w large abscess, soft BP, ivf resuscitation, iv abx, hypokalemia w iv k therapy, PE with suspected right heart strain, iv heparin Performed by: Mirna Mires Total critical care time: 140 minutes Critical care time was exclusive of  separately billable procedures and treating other patients. Critical care was necessary to treat or prevent imminent or life-threatening deterioration. Critical care was time spent personally by me on the following activities: development of treatment plan with patient and/or surrogate as well as nursing, discussions with consultants, evaluation of patient's response to treatment, examination of patient, obtaining history from patient or surrogate, ordering and performing treatments and interventions, ordering and review of laboratory studies, ordering and review of radiographic studies, pulse oximetry and re-evaluation of patient's condition.  Mg low, Mg iv.   Final Clinical Impression(s) / ED Diagnoses Final diagnoses:  None    Rx / DC Orders ED Discharge Orders     None           Lajean Saver, MD 05/26/21 2259

## 2021-05-26 NOTE — ED Triage Notes (Signed)
Pt via EMS from MD office-went there for two weeks of abdominal pain with n/v/d. MD office reports abdominal tenderness and hypotension, although normotensive with EMS. Given 300cc NS by EMS for hyperglycemia to 368.

## 2021-05-26 NOTE — ED Notes (Signed)
Patient transported to Ultrasound 

## 2021-05-26 NOTE — ED Notes (Signed)
Sent a urine culture with the urine specimen 

## 2021-05-26 NOTE — Progress Notes (Addendum)
ANTICOAGULATION CONSULT NOTE - Initial Consult  Pharmacy Consult for heparin Indication: pulmonary embolus  Allergies  Allergen Reactions   Gluten Meal     Celiac    Patient Measurements:   Heparin Dosing Weight: 122 kg   Vital Signs: Temp: 98.9 F (37.2 C) (06/10 1537) Temp Source: Oral (06/10 1537) BP: 110/82 (06/10 2015) Pulse Rate: 95 (06/10 2015)  Labs: Recent Labs    05/26/21 1601  HGB 13.1  HCT 41.0  PLT PLATELET CLUMPS NOTED ON SMEAR, UNABLE TO ESTIMATE  CREATININE 1.28*    CrCl cannot be calculated (Unknown ideal weight.).   Medical History: Past Medical History:  Diagnosis Date   Anemia    Celiac disease    CHF (congestive heart failure) (HCC)    Depression    Diabetes mellitus without complication (HCC)    Edema    lower extremities   Emphysema of lung (HCC)    Enlarged heart    begining stage   Graves disease    Headache(784.0) 05/18/2013   Heart murmur    Hematochezia    Hemorrhoid    Hyperlipidemia    Hypertension    Migraine    Obese    OSA (obstructive sleep apnea)    Seizures (HCC)    Sleep apnea    Stroke [facility])    left P-O ICH 2007   Thyroid disease    hypothyroidism   Ulcer    Vitamin D deficiency     Medications:  (Not in a hospital admission)   Assessment: 2 YOF who acute bilateral PE to start IV heparin. H/H wnl, SCr 1.28.   Goal of Therapy:  Heparin level 0.3-0.7 units/ml Monitor platelets by anticoagulation protocol: Yes   Plan:  -Heparin 5000 units IV bolus followed by heparin infusion at 1950 units/hr  -F/u 8 hr HL -Monitor daily HL, CBC and s/s of bleeding   Vinnie Level, PharmD., BCPS, BCCCP Clinical Pharmacist Please refer to [facility] for unit-specific pharmacist    Addendum: Now adding Zosyn to cover for diverticulitis. Patient has already received a dose of Cefepime and flagyl. Will start Zosyn 3.375 gm IV q 8 hours (EI infusion).   Vinnie Level, PharmD., BCPS, BCCCP Clinical  Pharmacist Please refer to [facility] for unit-specific pharmacist

## 2021-05-26 NOTE — H&P (Signed)
History and Physical    [name] NKN:[id number] DOB: 26-Aug-1951 DOA: 05/26/2021  PCP: Mckinley Jewel, MD  Patient coming from: Home.  Chief Complaint: Abdominal pain.  HPI: [name] is a 70 y.o. female with history of diastolic CHF, hypertension, diabetes mellitus, sleep apnea presents to the ER with complaints of abdominal pain with nausea vomiting diarrhea ongoing for 2 weeks.  Patient's abdominal pain worsened and went to the primary care physician and was referred to the ER.  Abdominal pain hurts to touch.  ED Course: In the ER initially patient was hypotensive improved with fluid boluses CT abdomen pelvis shows perforated sigmoid diverticulitis with abscess and also bilateral pulmonary embolism.  On-call general surgeon Dr. Donne Hazel was consulted and started on antibiotics and IR consult requested for drain placement.  Patient was started on heparin for pulm embolism.  Labs also show hypokalemia and hypomagnesemia.  Platelet counts have to be verified.  Review of Systems: As per HPI, rest all negative.   Past Medical History:  Diagnosis Date   Anemia    Celiac disease    CHF (congestive heart failure) ([place])    Depression    Diabetes mellitus without complication ([place])    Edema    lower extremities   Emphysema of lung ([place])    Enlarged heart    begining stage   Graves disease    Headache(784.0) 05/18/2013   Heart murmur    Hematochezia    Hemorrhoid    Hyperlipidemia    Hypertension    Migraine    Obese    OSA (obstructive sleep apnea)    Seizures ([place])    Sleep apnea    Stroke [facility] ([place]    left P-O ICH 2007   Thyroid disease    hypothyroidism   Ulcer    Vitamin D deficiency     Past Surgical History:  Procedure Laterality Date   CESAREAN SECTION     HEMORRHOID SURGERY     IUD REMOVAL     TONSILLECTOMY     VEIN SURGERY       reports that she has never smoked. She has never used smokeless tobacco. She reports that she does not drink  alcohol and does not use drugs.  Allergies  Allergen Reactions   Gluten Meal     Celiac    Family History  Problem Relation Age of Onset   Heart disease Father    Diabetes Father    Heart failure Father    Diabetes Mother    Thyroid disease Sister    Diabetes Brother     Prior to Admission medications   Medication Sig Start Date End Date Taking? Authorizing Provider  albuterol (VENTOLIN HFA) 108 (90 Base) MCG/ACT inhaler Inhale 2 puffs into the lungs every 6 (six) hours as needed for wheezing or shortness of breath.   Yes [provider]  amLODipine (NORVASC) 5 MG tablet Take 1 tablet (5 mg total) by mouth daily. 10/04/20  Yes Nahser, Wonda Cheng, MD  carvedilol (COREG) 25 MG tablet Take 1 tablet (25 mg total) by mouth 2 (two) times daily with a meal. 12/05/12  Yes Nahser, Wonda Cheng, MD  esomeprazole (NEXIUM) 20 MG capsule Take 1 capsule by mouth daily.   Yes [provider]  furosemide (LASIX) 20 MG tablet Take 1 tablet (20 mg total) by mouth daily. 05/22/21  Yes Hans Eden, NP  levothyroxine (SYNTHROID) 112 MCG tablet Take 1 tablet by mouth Monday through Saturday and  1/2 tablet on Sunday. 11/08/20  Yes Elayne Snare, MD  metFORMIN (GLUCOPHAGE) 500 MG tablet Take 500 mg by mouth 2 (two) times daily. 08/23/20  Yes [provider]  OVER THE COUNTER MEDICATION Take 1 tablet by mouth daily. Medication: Vitamin b12 3000 mcg   Yes [provider]  potassium chloride (KLOR-CON) 10 MEQ tablet Take 2 tablets (20 mEq total) by mouth daily. 11/07/20  Yes Nahser, Wonda Cheng, MD  predniSONE (DELTASONE) 20 MG tablet Take 60 mg by mouth daily with breakfast.   Yes [provider]  rosuvastatin (CRESTOR) 5 MG tablet Take 1 tablet (5 mg total) by mouth daily. 11/08/20  Yes Nahser, Wonda Cheng, MD  Blood Glucose Monitoring Suppl (ONETOUCH VERIO REFLECT) w/Device KIT 1 each by Other route as directed. 08/10/20   [provider]  Fluticasone-Salmeterol  (ADVAIR) 250-50 MCG/DOSE AEPB Inhale 1 puff into the lungs daily as needed for allergies.    [provider]  glucose blood (ONETOUCH VERIO) test strip Use as intstructed to check blood sugar once a day Dx Code E11.9 02/18/21   Elayne Snare, MD  Lancets [facility] ULTRASOFT) lancets Use as instructed 10/13/20   Elayne Snare, MD  meclizine (ANTIVERT) 25 MG tablet Take 1 tablet by mouth daily as needed for dizziness.    [provider]    Physical Exam: Constitutional: Moderately built and nourished. Vitals:   05/26/21 2015 05/26/21 2045 05/26/21 2130 05/26/21 2200  BP: 110/82 124/78 105/80 104/89  Pulse: 95 94 88 93  Resp: 20 20 (!) 22 20  Temp:      TempSrc:      SpO2: 90% 96% 96% 97%   Eyes: Anicteric no pallor. ENMT: No discharge from the ears eyes nose and mouth. Neck: No mass felt.  No neck rigidity. Respiratory: No rhonchi or crepitations. Cardiovascular: S1-S2 heard. Abdomen: Soft tenderness present bowel sounds not appreciated. Musculoskeletal: Edema. Skin: No rash. Neurologic: Alert awake oriented to time place and person.  Moves all extremities. Psychiatric: Appears normal.  Normal affect.   Labs on Admission: I have personally reviewed following labs and imaging studies  CBC: Recent Labs  Lab 05/23/21 2000 05/26/21 1601  WBC 9.2 8.2  NEUTROABS 8.6* 7.5  HGB 12.3 13.1  HCT 37.4 41.0  MCV 95.7 97.2  PLT 96* PLATELET CLUMPS NOTED ON SMEAR, UNABLE TO ESTIMATE   Basic Metabolic Panel: Recent Labs  Lab 05/23/21 2000 05/26/21 1601 05/26/21 1746  NA 135 134*  --   K 4.9 2.8*  --   CL 98 98  --   CO2 25 24  --   GLUCOSE 405* 287*  --   BUN 31* 23  --   CREATININE 1.18* 1.28*  --   CALCIUM 9.1 8.9  --   MG  --   --  1.4*   GFR: CrCl cannot be calculated (Unknown ideal weight.). Liver Function Tests: Recent Labs  Lab 05/23/21 2000 05/26/21 1601  AST 31 18  ALT 34 26  ALKPHOS 62 61  BILITOT 1.8* 1.2  PROT 6.3* 5.5*  ALBUMIN 3.8 2.8*    Recent Labs  Lab 05/26/21 1601  LIPASE 19   No results for input(s): AMMONIA in the last 168 hours. Coagulation Profile: No results for input(s): INR, PROTIME in the last 168 hours. Cardiac Enzymes: No results for input(s): CKTOTAL, CKMB, CKMBINDEX, TROPONINI in the last 168 hours. BNP (last 3 results) No results for input(s): PROBNP in the last 8760 hours. HbA1C: No results for input(s):  HGBA1C in the last 72 hours. CBG: Recent Labs  Lab 05/23/21 1819 05/23/21 2315  GLUCAP 398* 388*   Lipid Profile: No results for input(s): CHOL, HDL, LDLCALC, TRIG, CHOLHDL, LDLDIRECT in the last 72 hours. Thyroid Function Tests: No results for input(s): TSH, T4TOTAL, FREET4, T3FREE, THYROIDAB in the last 72 hours. Anemia Panel: No results for input(s): VITAMINB12, FOLATE, FERRITIN, TIBC, IRON, RETICCTPCT in the last 72 hours. Urine analysis:    Component Value Date/Time   COLORURINE YELLOW 05/26/2021 2115   APPEARANCEUR CLEAR 05/26/2021 2115   LABSPEC 1.017 05/26/2021 2115   PHURINE 5.0 05/26/2021 2115   GLUCOSEU 50 (A) 05/26/2021 2115   GLUCOSEU NEGATIVE 07/13/2020 0900   HGBUR SMALL (A) 05/26/2021 2115   BILIRUBINUR NEGATIVE 05/26/2021 2115   KETONESUR NEGATIVE 05/26/2021 2115   PROTEINUR NEGATIVE 05/26/2021 2115   UROBILINOGEN 0.2 07/13/2020 0900   NITRITE NEGATIVE 05/26/2021 2115   LEUKOCYTESUR TRACE (A) 05/26/2021 2115   Sepsis Labs: @LABRCNTIP (procalcitonin:4,lacticidven:4) ) Recent Results (from the past 240 hour(s))  Urine culture     Status: Abnormal   Collection Time: 05/23/21  7:44 PM   Specimen: Urine, Random  Result Value Ref Range Status   Specimen Description   Final    URINE, RANDOM Performed at [facility], [place] [address], [place], [place] [address]    Special Requests   Final    NONE Performed at [facility], [place] [address], [place], [place] [address]    Culture MULTIPLE SPECIES PRESENT, SUGGEST RECOLLECTION  (A)  Final   Report Status 05/25/2021 FINAL  Final  Resp Panel by RT-PCR (Flu A&B, Covid) Nasopharyngeal Swab     Status: None   Collection Time: 05/26/21  5:19 PM   Specimen: Nasopharyngeal Swab; Nasopharyngeal(NP) swabs in vial transport medium  Result Value Ref Range Status   SARS Coronavirus 2 by RT PCR NEGATIVE NEGATIVE Final    Comment: (NOTE) SARS-CoV-2 target nucleic acids are NOT DETECTED.  The SARS-CoV-2 RNA is generally detectable in upper respiratory specimens during the acute phase of infection. The lowest concentration of SARS-CoV-2 viral copies this assay can detect is 138 copies/mL. A negative result does not preclude SARS-Cov-2 infection and should not be used as the sole basis for treatment or other patient management decisions. A negative result may occur with  improper specimen collection/handling, submission of specimen other than nasopharyngeal swab, presence of viral mutation(s) within the areas targeted by this assay, and inadequate number of viral copies(<138 copies/mL). A negative result must be combined with clinical observations, patient history, and epidemiological information. The expected result is Negative.  Fact Sheet for Patients:  EntrepreneurPulse.com.au  Fact Sheet for Healthcare Providers:  IncredibleEmployment.be  This test is no t yet approved or cleared by the [place] FDA and  has been authorized for detection and/or diagnosis of SARS-CoV-2 by FDA under an Emergency Use Authorization (EUA). This EUA will remain  in effect (meaning this test can be used) for the duration of the COVID-19 declaration under Section 564(b)(1) of the Act, 21 U.S.C.section 360bbb-3(b)(1), unless the authorization is terminated  or revoked sooner.       Influenza A by PCR NEGATIVE NEGATIVE Final   Influenza B by PCR NEGATIVE NEGATIVE Final    Comment: (NOTE) The Xpert Xpress SARS-CoV-2/FLU/RSV plus assay is intended as  an aid in the diagnosis of influenza from Nasopharyngeal swab specimens and should not be used as a sole basis for treatment. Nasal washings and aspirates are unacceptable for Xpert  Xpress SARS-CoV-2/FLU/RSV testing.  Fact Sheet for Patients: EntrepreneurPulse.com.au  Fact Sheet for Healthcare Providers: IncredibleEmployment.be  This test is not yet approved or cleared by the [place] FDA and has been authorized for detection and/or diagnosis of SARS-CoV-2 by FDA under an Emergency Use Authorization (EUA). This EUA will remain in effect (meaning this test can be used) for the duration of the COVID-19 declaration under Section 564(b)(1) of the Act, 21 U.S.C. section 360bbb-3(b)(1), unless the authorization is terminated or revoked.  Performed at [facility], [place] [address], [place], [place] [address]      Radiological Exams on Admission: DG Chest 2 View  Result Date: 05/26/2021 CLINICAL DATA:  Hypoxia. EXAM: CHEST - 2 VIEW COMPARISON:  May 23, 2021. FINDINGS: The heart size and mediastinal contours are within normal limits. Both lungs are clear. The visualized skeletal structures are unremarkable. IMPRESSION: No active cardiopulmonary disease. Electronically Signed   By: Marijo Conception M.D.   On: 05/26/2021 16:48   CT Abdomen Pelvis W Contrast  Result Date: 05/26/2021 CLINICAL DATA:  Two week history of abdominal pain, nausea, vomiting and diarrhea. Abdominal tenderness and hypotensive. EXAM: CT ABDOMEN AND PELVIS WITH CONTRAST TECHNIQUE: Multidetector CT imaging of the abdomen and pelvis was performed using the standard protocol following bolus administration of intravenous contrast. CONTRAST:  4m OMNIPAQUE IOHEXOL 300 MG/ML  SOLN COMPARISON:  None. FINDINGS: Lower chest: The lung bases are clear of an acute process. No pleural effusions or pulmonary lesions. Findings of bilateral central and lower lobe pulmonary emboli are noted. A  dedicated CT chest may be helpful for further evaluation but there does appear to be flattening of the left ventricle which could suggest right heart strain. Hepatobiliary: No hepatic lesions or intrahepatic biliary dilatation. The gallbladder is grossly normal. No common bile duct dilatation. Pancreas: No mass, inflammation or ductal dilatation. Spleen: Normal size. No focal lesions. Adrenals/Urinary Tract: Adrenal glands and kidneys are unremarkable. The bladder is unremarkable. Stomach/Bowel: The stomach, duodenum and small bowel are unremarkable. No findings for small bowel obstruction. There are some mildly inflamed distal ileal loops of small bowel in the pelvis due to a large pelvic abscess. The terminal ileum is normal. The appendix is. There is sigmoid colon diverticulosis and I suspect there is perforated diverticulitis involving the mid sigmoid colon. There is a large complex abscess anterior to the uterus and in between distal small bowel loops. This measures a maximum of 9.2 x 5.8 cm. There is also some associated free pelvic fluid. Vascular/Lymphatic: Moderate scattered atherosclerotic changes involving the aorta and iliac arteries. No aneurysm. The major venous structures are patent. No mesenteric or retroperitoneal mass or adenopathy. No pelvic adenopathy or inguinal adenopathy. Reproductive: The uterus and ovaries are unremarkable. There are calcifications associated with both ovaries. Other: Small amount of free pelvic fluid. Musculoskeletal: No significant bony findings. IMPRESSION: 1. CT findings consistent with perforated diverticulitis involving the mid sigmoid colon with a large (9.2 x 5.8 cm) complex abscess anterior to the uterus and in between distal small bowel loops. 2. Bilateral central and lower lobe pulmonary emboli. Dedicated CTA chest for further evaluation may be helpful if clinically necessary. There is flattening of the left ventricle which suggesting right heart strain. These  results were called by telephone at the time of interpretation on 05/26/2021 at 8:05 pm to [facility], who verbally acknowledged these results. Aortic Atherosclerosis (ICD10-I70.0). Electronically Signed   By: PMarijo SanesM.D.   On: 05/26/2021 20:05  EKG: Independently reviewed.  Normal sinus rhythm though EKG by the computer is read as A. fib.  Assessment/Plan Principal Problem:   Diverticulitis of colon with perforation Active Problems:   Chronic diastolic CHF (congestive heart failure) (HCC)   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 (H[facility])   Moderate persistent asthma, uncomplicated   Seizure ([place])   Bilateral pulmonary embolism (H[facility])    Sigmoid diverticulitis with perforation and abscess for which general surgery has been consulted appreciate Dr. Cristal Generous consult and recommendations.  Presently on Zosyn n.p.o. IV fluids IR consulted. Bilateral pulmonary embolism likely unprovoked was mildly hypotensive at presentation which improved with fluids.  We will continue to closely monitor.  Check 2D echo Dopplers of the lower extremity trend cardiac markers on heparin at this time. History of diastolic CHF on presentation patient's blood pressure was low holding any diuretics for now. History of hypertension we will hold antihypertensives due to hypotension at presentation. Hypokalemia hypomagnesemia likely from vomiting and diarrhea.  Replace recheck. Diabetes mellitus type 2 we will keep patient study scale coverage. History of sleep apnea.   Since patient has diverticulitis with perforation and abscess and pulm embolism will need inpatient status for close monitoring.   DVT prophylaxis: Heparin infusion. Code Status: Full code. Family Communication: Discussed with patient. Disposition Plan: Home. Consults called: General surgery. Admission status: Inpatient.   Rise Patience MD [facility] Pager [phone].  If 7PM-7AM, please contact  night-coverage [email or web address] Password [email or web address]  05/26/2021, 11:40 PM

## 2021-05-26 NOTE — Consult Note (Signed)
Reason for Consult:ab pain, ct findings Referring Physician: Dr Thana AtesSteinl  [name] is an 70 y.o. female.  HPI: [age] yof with mm including chf, dm, osa presents with a couple weeks of ab pain. Bilateral lq. States cannot touch. Not getting better with anything so saw pcp and referred to er.  She is also having loose stools. No fevers.  Has prior c/s via low midline. Also has le edema significant as well. Denies sob. She had ct a/p that shows likely diverticular abscess and I was called   Past Medical History:  Diagnosis Date   Anemia    Celiac disease    CHF (congestive heart failure) (HCC)    Depression    Diabetes mellitus without complication (HCC)    Edema    lower extremities   Emphysema of lung (HCC)    Enlarged heart    begining stage   Graves disease    Headache(784.0) 05/18/2013   Heart murmur    Hematochezia    Hemorrhoid    Hyperlipidemia    Hypertension    Migraine    Obese    OSA (obstructive sleep apnea)    Seizures (HCC)    Sleep apnea    Stroke (HCC)    left P-O ICH 2007   Thyroid disease    hypothyroidism   Ulcer    Vitamin D deficiency     Past Surgical History:  Procedure Laterality Date   CESAREAN SECTION     HEMORRHOID SURGERY     IUD REMOVAL     TONSILLECTOMY     VEIN SURGERY      Family History  Problem Relation Age of Onset   Heart disease Father    Diabetes Father    Heart failure Father    Diabetes Mother    Thyroid disease Sister    Diabetes Brother     Social History:  reports that she has never smoked. She has never used smokeless tobacco. She reports that she does not drink alcohol and does not use drugs.  Allergies:  Allergies  Allergen Reactions   Gluten Meal     Celiac    Meds reviwed  Results for orders placed or performed during the hospital encounter of 05/26/21 (from the past 48 hour(s))  Comprehensive metabolic panel     Status: Abnormal   Collection Time: 05/26/21  4:01 PM  Result Value Ref Range    Sodium 134 (L) 135 - 145 mmol/L   Potassium 2.8 (L) 3.5 - 5.1 mmol/L   Chloride 98 98 - 111 mmol/L   CO2 24 22 - 32 mmol/L   Glucose, Bld 287 (H) 70 - 99 mg/dL    Comment: Glucose reference range applies only to samples taken after fasting for at least 8 hours.   BUN 23 8 - 23 mg/dL   Creatinine, Ser 6.041.28 (H) 0.44 - 1.00 mg/dL   Calcium 8.9 8.9 - 54.010.3 mg/dL   Total Protein 5.5 (L) 6.5 - 8.1 g/dL   Albumin 2.8 (L) 3.5 - 5.0 g/dL   AST 18 15 - 41 U/L   ALT 26 0 - 44 U/L   Alkaline Phosphatase 61 38 - 126 U/L   Total Bilirubin 1.2 0.3 - 1.2 mg/dL   GFR, Estimated 45 (L) >60 mL/min    Comment: (NOTE) Calculated using the CKD-EPI Creatinine Equation (2021)    Anion gap 12 5 - 15    Comment: Performed at [facility], [address] [place],  [place] [address]  Lipase, blood     Status: None   Collection Time: 05/26/21  4:01 PM  Result Value Ref Range   Lipase 19 11 - 51 U/L    Comment: Performed at [facility], [address] [place], [place] [address]  CBC with Differential     Status: Abnormal   Collection Time: 05/26/21  4:01 PM  Result Value Ref Range   WBC 8.2 4.0 - 10.5 K/uL   RBC 4.22 3.87 - 5.11 MIL/uL   Hemoglobin 13.1 12.0 - 15.0 g/dL   HCT 46.6 59.9 - 35.7 %   MCV 97.2 80.0 - 100.0 fL   MCH 31.0 26.0 - 34.0 pg   MCHC 32.0 30.0 - 36.0 g/dL   RDW 01.7 79.3 - 90.3 %   Platelets PLATELET CLUMPS NOTED ON SMEAR, UNABLE TO ESTIMATE 150 - 400 K/uL    Comment: Immature Platelet Fraction may be clinically indicated, consider ordering this additional test [id number]    nRBC 0.0 0.0 - 0.2 %   Neutrophils Relative % 92 %   Neutro Abs 7.5 1.7 - 7.7 K/uL   Lymphocytes Relative 5 %   Lymphs Abs 0.4 (L) 0.7 - 4.0 K/uL   Monocytes Relative 2 %   Monocytes Absolute 0.2 0.1 - 1.0 K/uL   Eosinophils Relative 1 %   Eosinophils Absolute 0.1 0.0 - 0.5 K/uL   Basophils Relative 0 %   Basophils Absolute 0.0 0.0 - 0.1 K/uL   nRBC 1 (H) 0 /100 WBC   Abs Immature  Granulocytes 0.00 0.00 - 0.07 K/uL    Comment: Performed at [facility], [address] [place], [place] [address]  Resp Panel by RT-PCR (Flu A&B, Covid) Nasopharyngeal Swab     Status: None   Collection Time: 05/26/21  5:19 PM   Specimen: Nasopharyngeal Swab; Nasopharyngeal(NP) swabs in vial transport medium  Result Value Ref Range   SARS Coronavirus 2 by RT PCR NEGATIVE NEGATIVE    Comment: (NOTE) SARS-CoV-2 target nucleic acids are NOT DETECTED.  The SARS-CoV-2 RNA is generally detectable in upper respiratory specimens during the acute phase of infection. The lowest concentration of SARS-CoV-2 viral copies this assay can detect is 138 copies/mL. A negative result does not preclude SARS-Cov-2 infection and should not be used as the sole basis for treatment or other patient management decisions. A negative result may occur with  improper specimen collection/handling, submission of specimen other than nasopharyngeal swab, presence of viral mutation(s) within the areas targeted by this assay, and inadequate number of viral copies(<138 copies/mL). A negative result must be combined with clinical observations, patient history, and epidemiological information. The expected result is Negative.  Fact Sheet for Patients:  BloggerCourse.com  Fact Sheet for Healthcare Providers:  SeriousBroker.it  This test is no t yet approved or cleared by the [place] FDA and  has been authorized for detection and/or diagnosis of SARS-CoV-2 by FDA under an Emergency Use Authorization (EUA). This EUA will remain  in effect (meaning this test can be used) for the duration of the COVID-19 declaration under Section 564(b)(1) of the Act, 21 U.S.C.section 360bbb-3(b)(1), unless the authorization is terminated  or revoked sooner.       Influenza A by PCR NEGATIVE NEGATIVE   Influenza B by PCR NEGATIVE NEGATIVE    Comment: (NOTE) The Xpert  Xpress SARS-CoV-2/FLU/RSV plus assay is intended as an aid in the diagnosis of influenza from Nasopharyngeal swab specimens and should not be used as  a sole basis for treatment. Nasal washings and aspirates are unacceptable for Xpert Xpress SARS-CoV-2/FLU/RSV testing.  Fact Sheet for Patients: BloggerCourse.com  Fact Sheet for Healthcare Providers: SeriousBroker.it  This test is not yet approved or cleared by the [place] FDA and has been authorized for detection and/or diagnosis of SARS-CoV-2 by FDA under an Emergency Use Authorization (EUA). This EUA will remain in effect (meaning this test can be used) for the duration of the COVID-19 declaration under Section 564(b)(1) of the Act, 21 U.S.C. section 360bbb-3(b)(1), unless the authorization is terminated or revoked.  Performed at [facility], [address], [place], [place] [address]     DG Chest 2 View  Result Date: 05/26/2021 CLINICAL DATA:  Hypoxia. EXAM: CHEST - 2 VIEW COMPARISON:  May 23, 2021. FINDINGS: The heart size and mediastinal contours are within normal limits. Both lungs are clear. The visualized skeletal structures are unremarkable. IMPRESSION: No active cardiopulmonary disease. Electronically Signed   By: Lupita Raider M.D.   On: 05/26/2021 16:48   CT Abdomen Pelvis W Contrast  Result Date: 05/26/2021 CLINICAL DATA:  Two week history of abdominal pain, nausea, vomiting and diarrhea. Abdominal tenderness and hypotensive. EXAM: CT ABDOMEN AND PELVIS WITH CONTRAST TECHNIQUE: Multidetector CT imaging of the abdomen and pelvis was performed using the standard protocol following bolus administration of intravenous contrast. CONTRAST:  17mL OMNIPAQUE IOHEXOL 300 MG/ML  SOLN COMPARISON:  None. FINDINGS: Lower chest: The lung bases are clear of an acute process. No pleural effusions or pulmonary lesions. Findings of bilateral central and lower lobe pulmonary  emboli are noted. A dedicated CT chest may be helpful for further evaluation but there does appear to be flattening of the left ventricle which could suggest right heart strain. Hepatobiliary: No hepatic lesions or intrahepatic biliary dilatation. The gallbladder is grossly normal. No common bile duct dilatation. Pancreas: No mass, inflammation or ductal dilatation. Spleen: Normal size. No focal lesions. Adrenals/Urinary Tract: Adrenal glands and kidneys are unremarkable. The bladder is unremarkable. Stomach/Bowel: The stomach, duodenum and small bowel are unremarkable. No findings for small bowel obstruction. There are some mildly inflamed distal ileal loops of small bowel in the pelvis due to a large pelvic abscess. The terminal ileum is normal. The appendix is. There is sigmoid colon diverticulosis and I suspect there is perforated diverticulitis involving the mid sigmoid colon. There is a large complex abscess anterior to the uterus and in between distal small bowel loops. This measures a maximum of 9.2 x 5.8 cm. There is also some associated free pelvic fluid. Vascular/Lymphatic: Moderate scattered atherosclerotic changes involving the aorta and iliac arteries. No aneurysm. The major venous structures are patent. No mesenteric or retroperitoneal mass or adenopathy. No pelvic adenopathy or inguinal adenopathy. Reproductive: The uterus and ovaries are unremarkable. There are calcifications associated with both ovaries. Other: Small amount of free pelvic fluid. Musculoskeletal: No significant bony findings. IMPRESSION: 1. CT findings consistent with perforated diverticulitis involving the mid sigmoid colon with a large (9.2 x 5.8 cm) complex abscess anterior to the uterus and in between distal small bowel loops. 2. Bilateral central and lower lobe pulmonary emboli. Dedicated CTA chest for further evaluation may be helpful if clinically necessary. There is flattening of the left ventricle which suggesting right  heart strain. These results were called by telephone at the time of interpretation on 05/26/2021 at 8:05 pm to provider [facility] , who verbally acknowledged these results. Aortic Atherosclerosis (ICD10-I70.0). Electronically Signed   By: Demetrius Charity.  Gallerani M.D.   On: 05/26/2021 20:05    Review of Systems  Constitutional:  Positive for fatigue. Negative for fever.  Respiratory:  Positive for cough. Negative for shortness of breath.   Gastrointestinal:  Positive for abdominal pain, diarrhea and nausea.  Musculoskeletal:  Positive for myalgias.  Blood pressure 110/82, pulse 95, temperature 98.9 F (37.2 C), temperature source Oral, resp. rate 20, SpO2 90 %. Physical Exam Constitutional:      Appearance: She is well-developed. She is obese.  Eyes:     General: No scleral icterus. Cardiovascular:     Rate and Rhythm: Normal rate and regular rhythm.  Pulmonary:     Effort: Pulmonary effort is normal.  Abdominal:     General: Bowel sounds are normal. There is no distension.     Palpations: Abdomen is soft.     Tenderness: There is abdominal tenderness in the right lower quadrant, suprapubic area and left lower quadrant.     Hernia: No hernia is present.     Comments: Low midline incision   Skin:    General: Skin is warm.     Capillary Refill: Capillary refill takes less than 2 seconds.  Neurological:     Mental Status: She is alert.  Psychiatric:        Mood and Affect: Mood normal.        Behavior: Behavior normal.    Assessment/Plan: Diverticulitis with abscess -npo, zosyn for complicated disease -will ask IR to see -hopefully can avoid any surgery as this would be high risk Bilateral PE -heparin IV in case needs procedures Medicine to admit and we will follow   Emelia Loron 05/26/2021, 9:21 PM

## 2021-05-26 NOTE — ED Provider Notes (Addendum)
Emergency Medicine Provider Triage Evaluation Note  [name] , a 70 y.o. female  was evaluated in triage.  Pt complains of. Abd pain and diarrhea. Denies fever, vomiting, chest pain, sob  Review of Systems  Positive: Abd pain, diarrhea Negative: Fever, vomiting, chest pain, sob  Physical Exam  BP 92/75 (BP Location: Left Arm)   Pulse (!) 103   Temp 98.9 F (37.2 C) (Oral)   Resp (!) 22   SpO2 91%  Gen:   Awake, no distress   Resp:  Normal effort  MSK:   Moves extremities without difficulty  Abd is diffusely tender and distended  Medical Decision Making  Medically screening exam initiated at 3:42 PM.  Appropriate orders placed.  [name] was informed that the remainder of the evaluation will be completed by another provider, this initial triage assessment does not replace that evaluation, and the importance of remaining in the ED until their evaluation is complete.     Karrie Meres, PA-C 05/26/21 1547    Karrie Meres, PA-C 05/26/21 1549    Eber Hong, MD 05/26/21 [phone]

## 2021-05-26 NOTE — ED Notes (Signed)
Attempted to call report to 4N. No answer.

## 2021-05-27 ENCOUNTER — Inpatient Hospital Stay (HOSPITAL_COMMUNITY): Payer: Medicare HMO

## 2021-05-27 ENCOUNTER — Encounter (HOSPITAL_COMMUNITY): Payer: Self-pay | Admitting: Internal Medicine

## 2021-05-27 DIAGNOSIS — I5032 Chronic diastolic (congestive) heart failure: Secondary | ICD-10-CM

## 2021-05-27 DIAGNOSIS — J4540 Moderate persistent asthma, uncomplicated: Secondary | ICD-10-CM

## 2021-05-27 DIAGNOSIS — I351 Nonrheumatic aortic (valve) insufficiency: Secondary | ICD-10-CM

## 2021-05-27 DIAGNOSIS — I2699 Other pulmonary embolism without acute cor pulmonale: Secondary | ICD-10-CM

## 2021-05-27 LAB — COMPREHENSIVE METABOLIC PANEL
ALT: 23 U/L (ref 0–44)
AST: 13 U/L — ABNORMAL LOW (ref 15–41)
Albumin: 2.2 g/dL — ABNORMAL LOW (ref 3.5–5.0)
Alkaline Phosphatase: 52 U/L (ref 38–126)
Anion gap: 9 (ref 5–15)
BUN: 22 mg/dL (ref 8–23)
CO2: 24 mmol/L (ref 22–32)
Calcium: 8.1 mg/dL — ABNORMAL LOW (ref 8.9–10.3)
Chloride: 105 mmol/L (ref 98–111)
Creatinine, Ser: 1.26 mg/dL — ABNORMAL HIGH (ref 0.44–1.00)
GFR, Estimated: 46 mL/min — ABNORMAL LOW (ref >60)
Glucose, Bld: 187 mg/dL — ABNORMAL HIGH (ref 70–99)
Potassium: 2.8 mmol/L — ABNORMAL LOW (ref 3.5–5.1)
Sodium: 138 mmol/L (ref 135–145)
Total Bilirubin: 1 mg/dL (ref 0.3–1.2)
Total Protein: 4.4 g/dL — ABNORMAL LOW (ref 6.5–8.1)

## 2021-05-27 LAB — SURGICAL PCR SCREEN
MRSA, PCR: NEGATIVE
Staphylococcus aureus: NEGATIVE

## 2021-05-27 LAB — GLUCOSE, CAPILLARY
Glucose-Capillary: 136 mg/dL — ABNORMAL HIGH (ref 70–99)
Glucose-Capillary: 143 mg/dL — ABNORMAL HIGH (ref 70–99)
Glucose-Capillary: 166 mg/dL — ABNORMAL HIGH (ref 70–99)
Glucose-Capillary: 166 mg/dL — ABNORMAL HIGH (ref 70–99)
Glucose-Capillary: 178 mg/dL — ABNORMAL HIGH (ref 70–99)
Glucose-Capillary: 241 mg/dL — ABNORMAL HIGH (ref 70–99)

## 2021-05-27 LAB — ECHOCARDIOGRAM COMPLETE
AR max vel: 2.55 cm2
AV Area VTI: 2.75 cm2
AV Area mean vel: 2.78 cm2
AV Mean grad: 6.3 mmHg
AV Peak grad: 10.6 mmHg
Ao pk vel: 1.63 m/s
Height: 62 in
P 1/2 time: 635 msec
S' Lateral: 1.8 cm
Weight: 3848.35 oz

## 2021-05-27 LAB — CBC
HCT: 35.2 % — ABNORMAL LOW (ref 36.0–46.0)
Hemoglobin: 11.7 g/dL — ABNORMAL LOW (ref 12.0–15.0)
MCH: 31.3 pg (ref 26.0–34.0)
MCHC: 33.2 g/dL (ref 30.0–36.0)
MCV: 94.1 fL (ref 80.0–100.0)
Platelets: 73 10*3/uL — ABNORMAL LOW (ref 150–400)
RBC: 3.74 MIL/uL — ABNORMAL LOW (ref 3.87–5.11)
RDW: 14.7 % (ref 11.5–15.5)
WBC: 10 10*3/uL (ref 4.0–10.5)
nRBC: 0 % (ref 0.0–0.2)

## 2021-05-27 LAB — HEPARIN LEVEL (UNFRACTIONATED)
Heparin Unfractionated: >1.1 IU/mL — ABNORMAL HIGH (ref 0.30–0.70)
Heparin Unfractionated: >1.1 IU/mL — ABNORMAL HIGH (ref 0.30–0.70)

## 2021-05-27 LAB — HIV ANTIBODY (ROUTINE TESTING W REFLEX): HIV Screen 4th Generation wRfx: NONREACTIVE

## 2021-05-27 MED ORDER — HEPARIN (PORCINE) 25000 UT/250ML-% IV SOLN
1600.0000 [IU]/h | INTRAVENOUS | Status: DC
  Administered 2021-05-27: 1600 [IU]/h via INTRAVENOUS
  Filled 2021-05-27: qty 250

## 2021-05-27 MED ORDER — MUPIROCIN 2 % EX OINT
1.0000 "application " | TOPICAL_OINTMENT | Freq: Two times a day (BID) | CUTANEOUS | Status: AC
  Administered 2021-05-27 – 2021-05-31 (×9): 1 via NASAL
  Filled 2021-05-27 (×2): qty 22

## 2021-05-27 MED ORDER — SODIUM CHLORIDE 0.9 % IV SOLN: INTRAVENOUS | Status: AC

## 2021-05-27 MED ORDER — MORPHINE SULFATE (PF) 2 MG/ML IV SOLN
1.0000 mg | INTRAVENOUS | Status: DC | PRN
  Administered 2021-05-27 – 2021-05-31 (×4): 1 mg via INTRAVENOUS
  Filled 2021-05-27 (×4): qty 1

## 2021-05-27 MED ORDER — POTASSIUM CHLORIDE CRYS ER 20 MEQ PO TBCR: 40.0000 meq | EXTENDED_RELEASE_TABLET | ORAL | Status: DC

## 2021-05-27 MED ORDER — HEPARIN (PORCINE) 25000 UT/250ML-% IV SOLN
1300.0000 [IU]/h | INTRAVENOUS | Status: DC
  Administered 2021-05-27: 1300 [IU]/h via INTRAVENOUS
  Filled 2021-05-27: qty 250

## 2021-05-27 MED ORDER — POTASSIUM CHLORIDE 10 MEQ/100ML IV SOLN
10.0000 meq | INTRAVENOUS | Status: AC
  Administered 2021-05-27 (×2): 10 meq via INTRAVENOUS
  Filled 2021-05-27 (×3): qty 100

## 2021-05-27 NOTE — H&P (Signed)
Chief Complaint: Intra abdominal abscess  Referring Physician(s): Donne Hazel  Supervising Physician: Corrie Mckusick  Patient Status: [facility] - In-pt  History of Present Illness: [name] is a 70 y.o. female  with medical including diastolic CHF, hypertension, diabetes mellitus, and sleep apnea.  She presented to the Ed yesterday with complaints of abdominal pain, nausea vomiting, and diarrhea for 2 weeks.    Per chart, her abdominal pain worsened and she went to the primary care physician and was referred to the ED.  A  In the ED she was initially hypotensive which improved with fluid boluses.  CT abdomen pelvis showed perforated sigmoid diverticulitis with abscess and also bilateral pulmonary embolism.    Dr. Donne Hazel with General Surgery was consulted and she was started on antibiotics.  He recommended we evaluate her for drain placement.    She was started on heparin for pulmonary embolism.    She is NPO.   Past Medical History:  Diagnosis Date   Anemia    Celiac disease    CHF (congestive heart failure) ([place])    Depression    Diabetes mellitus without complication ([place])    Edema    lower extremities   Emphysema of lung ([place])    Enlarged heart    begining stage   Graves disease    Headache(784.0) 05/18/2013   Heart murmur    Hematochezia    Hemorrhoid    Hyperlipidemia    Hypertension    Migraine    Obese    OSA (obstructive sleep apnea)    Seizures ([place])    Sleep apnea    Stroke ([place])    left P-O ICH 2007   Thyroid disease    hypothyroidism   Ulcer    Vitamin D deficiency     Past Surgical History:  Procedure Laterality Date   CESAREAN SECTION     HEMORRHOID SURGERY     IUD REMOVAL     TONSILLECTOMY     VEIN SURGERY      Allergies: Gluten meal  Medications: Prior to Admission medications   Medication Sig Start Date End Date Taking? Authorizing Provider  albuterol (VENTOLIN HFA) 108 (90 Base) MCG/ACT inhaler Inhale 2 puffs into  the lungs every 6 (six) hours as needed for wheezing or shortness of breath.   Yes [provider]  amLODipine (NORVASC) 5 MG tablet Take 1 tablet (5 mg total) by mouth daily. 10/04/20  Yes Nahser, Wonda Cheng, MD  carvedilol (COREG) 25 MG tablet Take 1 tablet (25 mg total) by mouth 2 (two) times daily with a meal. 12/05/12  Yes Nahser, Wonda Cheng, MD  esomeprazole (NEXIUM) 20 MG capsule Take 1 capsule by mouth daily.   Yes [provider]  furosemide (LASIX) 20 MG tablet Take 1 tablet (20 mg total) by mouth daily. 05/22/21  Yes Hans Eden, NP  levothyroxine (SYNTHROID) 112 MCG tablet Take 1 tablet by mouth Monday through Saturday and 1/2 tablet on Sunday. 11/08/20  Yes Elayne Snare, MD  metFORMIN (GLUCOPHAGE) 500 MG tablet Take 500 mg by mouth 2 (two) times daily. 08/23/20  Yes [provider]  OVER THE COUNTER MEDICATION Take 1 tablet by mouth daily. Medication: Vitamin b12 3000 mcg   Yes [provider]  potassium chloride (KLOR-CON) 10 MEQ tablet Take 2 tablets (20 mEq total) by mouth daily. 11/07/20  Yes Nahser, Wonda Cheng, MD  predniSONE (DELTASONE) 20 MG tablet Take 60 mg by mouth daily with breakfast.  Yes [provider]  rosuvastatin (CRESTOR) 5 MG tablet Take 1 tablet (5 mg total) by mouth daily. 11/08/20  Yes Nahser, Wonda Cheng, MD  Blood Glucose Monitoring Suppl (ONETOUCH VERIO REFLECT) w/Device KIT 1 each by Other route as directed. 08/10/20   [provider]  Fluticasone-Salmeterol (ADVAIR) 250-50 MCG/DOSE AEPB Inhale 1 puff into the lungs daily as needed for allergies.    [provider]  glucose blood (ONETOUCH VERIO) test strip Use as intstructed to check blood sugar once a day Dx Code E11.9 02/18/21   Elayne Snare, MD  Lancets [facility] ULTRASOFT) lancets Use as instructed 10/13/20   Elayne Snare, MD  meclizine (ANTIVERT) 25 MG tablet Take 1 tablet by mouth daily as needed for dizziness.    [provider]     Family  History  Problem Relation Age of Onset   Heart disease Father    Diabetes Father    Heart failure Father    Diabetes Mother    Thyroid disease Sister    Diabetes Brother     Social History   Socioeconomic History   Marital status: Widowed    Spouse name: Not on file   Number of children: Not on file   Years of education: Not on file   Highest education level: Not on file  Occupational History   Not on file  Tobacco Use   Smoking status: Never   Smokeless tobacco: Never  Vaping Use   Vaping Use: Never used  Substance and Sexual Activity   Alcohol use: No   Drug use: No   Sexual activity: Never  Other Topics Concern   Not on file  Social History Narrative   Not on file   Social Determinants of Health   Financial Resource Strain: Not on file  Food Insecurity: Not on file  Transportation Needs: Not on file  Physical Activity: Not on file  Stress: Not on file  Social Connections: Not on file     Review of Systems: A 12 point ROS discussed and pertinent positives are indicated in the HPI above.  All other systems are negative.  Review of Systems  Vital Signs: BP 96/67 (BP Location: Right Wrist)   Pulse 89   Temp 98 F (36.7 C) (Oral)   Resp 20   Ht 5' 2" (1.575 m)   Wt 109.1 kg   SpO2 95%   BMI 43.99 kg/m   Physical Exam Vitals reviewed.  Constitutional:      Appearance: Normal appearance.  HENT:     Head: Normocephalic and atraumatic.  Eyes:     Extraocular Movements: Extraocular movements intact.     Comments: Thyroid eye disease  Cardiovascular:     Rate and Rhythm: Normal rate and regular rhythm.  Pulmonary:     Effort: Pulmonary effort is normal. No respiratory distress.     Breath sounds: Normal breath sounds.  Abdominal:     Palpations: Abdomen is soft.  Musculoskeletal:        General: Normal range of motion.  Skin:    General: Skin is warm and dry.  Neurological:     General: No focal deficit present.     Mental Status: She is  alert and oriented to person, place, and time.  Psychiatric:        Mood and Affect: Mood normal.        Behavior: Behavior normal.        Thought Content: Thought content normal.  Judgment: Judgment normal.    Imaging: DG Chest 2 View  Result Date: 05/26/2021 CLINICAL DATA:  Hypoxia. EXAM: CHEST - 2 VIEW COMPARISON:  May 23, 2021. FINDINGS: The heart size and mediastinal contours are within normal limits. Both lungs are clear. The visualized skeletal structures are unremarkable. IMPRESSION: No active cardiopulmonary disease. Electronically Signed   By: Marijo Conception M.D.   On: 05/26/2021 16:48   CT Abdomen Pelvis W Contrast  Result Date: 05/26/2021 CLINICAL DATA:  Two week history of abdominal pain, nausea, vomiting and diarrhea. Abdominal tenderness and hypotensive. EXAM: CT ABDOMEN AND PELVIS WITH CONTRAST TECHNIQUE: Multidetector CT imaging of the abdomen and pelvis was performed using the standard protocol following bolus administration of intravenous contrast. CONTRAST:  65m OMNIPAQUE IOHEXOL 300 MG/ML  SOLN COMPARISON:  None. FINDINGS: Lower chest: The lung bases are clear of an acute process. No pleural effusions or pulmonary lesions. Findings of bilateral central and lower lobe pulmonary emboli are noted. A dedicated CT chest may be helpful for further evaluation but there does appear to be flattening of the left ventricle which could suggest right heart strain. Hepatobiliary: No hepatic lesions or intrahepatic biliary dilatation. The gallbladder is grossly normal. No common bile duct dilatation. Pancreas: No mass, inflammation or ductal dilatation. Spleen: Normal size. No focal lesions. Adrenals/Urinary Tract: Adrenal glands and kidneys are unremarkable. The bladder is unremarkable. Stomach/Bowel: The stomach, duodenum and small bowel are unremarkable. No findings for small bowel obstruction. There are some mildly inflamed distal ileal loops of small bowel in the pelvis due to a  large pelvic abscess. The terminal ileum is normal. The appendix is. There is sigmoid colon diverticulosis and I suspect there is perforated diverticulitis involving the mid sigmoid colon. There is a large complex abscess anterior to the uterus and in between distal small bowel loops. This measures a maximum of 9.2 x 5.8 cm. There is also some associated free pelvic fluid. Vascular/Lymphatic: Moderate scattered atherosclerotic changes involving the aorta and iliac arteries. No aneurysm. The major venous structures are patent. No mesenteric or retroperitoneal mass or adenopathy. No pelvic adenopathy or inguinal adenopathy. Reproductive: The uterus and ovaries are unremarkable. There are calcifications associated with both ovaries. Other: Small amount of free pelvic fluid. Musculoskeletal: No significant bony findings. IMPRESSION: 1. CT findings consistent with perforated diverticulitis involving the mid sigmoid colon with a large (9.2 x 5.8 cm) complex abscess anterior to the uterus and in between distal small bowel loops. 2. Bilateral central and lower lobe pulmonary emboli. Dedicated CTA chest for further evaluation may be helpful if clinically necessary. There is flattening of the left ventricle which suggesting right heart strain. These results were called by telephone at the time of interpretation on 05/26/2021 at 8:05 pm [facility], who verbally acknowledged these results. Aortic Atherosclerosis (ICD10-I70.0). Electronically Signed   By: PMarijo SanesM.D.   On: 05/26/2021 20:05   DG Chest Portable 1 View  Result Date: 05/23/2021 CLINICAL DATA:  Shortness of breath with exertion EXAM: PORTABLE CHEST 1 VIEW COMPARISON:  01/23/2021 FINDINGS: No new consolidation or edema. No pleural effusion. Normal heart size. No acute osseous abnormality. IMPRESSION: No acute process in the chest. Electronically Signed   By: PMacy MisM.D.   On: 05/23/2021 20:43    Labs:  CBC: Recent Labs     04/22/21 1544 05/23/21 2000 05/26/21 1601 05/27/21 0327  WBC 11.1* 9.2 8.2 10.0  HGB 12.2 12.3 13.1 11.7*  HCT 38.9 37.4 41.0 35.2*  PLT 165 96* PLATELET CLUMPS NOTED ON SMEAR, UNABLE TO ESTIMATE 73*    COAGS: No results for input(s): INR, APTT in the last 8760 hours.  BMP: Recent Labs    11/07/20 1058 03/26/21 1927 04/22/21 1544 05/23/21 2000 05/26/21 1601 05/27/21 0327  NA 141   < > 139 135 134* 138  K 4.1   < > 4.4 4.9 2.8* 2.8*  CL 103   < > 101 98 98 105  CO2 24   < > _0 GLUCOSE 118*   < > 236* 405* 287* 187*  BUN 13   < > 22 31* 23 22  CALCIUM 9.9   < > 9.5 9.1 8.9 8.1*  CREATININE 1.06*   < > 1.09* 1.18* 1.28* 1.26*  GFRNONAA 53*   < > 55* 50* 45* 46*  GFRAA 61  --   --   --   --   --    < > = values in this interval not displayed.    LIVER FUNCTION TESTS: Recent Labs    11/07/20 1058 05/23/21 2000 05/26/21 1601 05/27/21 0327  BILITOT 0.6 1.8* 1.2 1.0  AST _1 13*  ALT 21 34 26 23  ALKPHOS 73 62 61 52  PROT 6.5 6.3* 5.5* 4.4*  ALBUMIN 4.3 3.8 2.8* 2.2*    TUMOR MARKERS: No results for input(s): AFPTM, CEA, CA199, CHROMGRNA in the last 8760 hours.  Assessment and Plan:  Perforated diverticulitis involving the mid sigmoid colon with a large (9.2 x 5.8 cm) complex abscess anterior to the uterus and in between distal small bowel loops.  Images reviewed by Dr. Earleen Newport. Will proceed with image guided drain placement today  Risks and benefits discussed with the patient including bleeding, infection, damage to adjacent structures, bowel perforation/fistula connection, and sepsis.  All of the patient's questions were answered, patient is agreeable to proceed. Consent signed and in chart.  Thank you for this interesting consult.  I greatly enjoyed meeting [name] and look forward to participating in their care.  A copy of this report was sent to the requesting provider on this date.  Electronically Signed: Murrell Redden, PA-C    05/27/2021, 9:35 AM      I spent a total of 40 Minutes in face to face in clinical consultation, greater than 50% of which was counseling/coordinating care for drain placement.

## 2021-05-27 NOTE — Progress Notes (Signed)
PROGRESS NOTE    [name]  KGM:[id number] DOB: June 18, 1951 DOA: 05/26/2021 PCP: Ollen Bowl, MD   Brief Narrative: [name] is a 70 y.o. female with a history of diastolic heart failure, hypertension, sleep apnea. Patient presented secondary to abdominal pain and found to have evidence of diverticulitis with perforation and abscess; general surgery consulted and patient started on Zosyn IV. Patient found incidentally to have a large PE with features of right heart strain, started on Heparin IV.   Assessment & Plan:   Principal Problem:   Diverticulitis of colon with perforation Active Problems:   Chronic diastolic CHF (congestive heart failure) (HCC)   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 (HCC)   Moderate persistent asthma, uncomplicated   Seizure (HCC)   Bilateral pulmonary embolism (HCC)   Diverticulitis with perforation and abscess General surgery consulted and recommend IR consult for drain placement. Started empirically on Zosyn IV on admission. -Continue Zosyn IV -Follow-up general surgery and IR recommendations  Bilateral pulmonary embolism No provoking factor identified. Slightly hypotensive on admission which resolved with IV fluids. Started on Heparin IV. CT suggests evidence of right heart strain -Stat Transthoracic Echocardiogram  -Continue Heparin IV  Acute respiratory failure with hypoxia Secondary to PE. -Continue oxygen therapy  Chronic diastolic heart failure Stable.  Primary hypertension Patient is on amlodipine, Coreg as an outpatient. Antihypertensives held secondary to hypotension on admission. Blood pressure controlled currently.  Hypokalemia Hypomagnesemia Given repletion. -Continue to replete as needed  Diabetes mellitus, type 2 -Continue SSI  Morbid obesity Body mass index is 43.99 kg/m.    DVT prophylaxis: Heparin IV Code Status:   Code Status: Full Code Family Communication: None at  bedside Disposition Plan: Discharge home vs SNF pending management of diverticulitis/abscess   Consultants:  General surgery Interventional radiology  Procedures:  None  Antimicrobials: Zosyn IV    Subjective: Thirsty. No other concerns.  Objective: Vitals:   05/26/21 2200 05/27/21 0041 05/27/21 0320 05/27/21 0700  BP: 104/89 107/66 106/65 96/67  Pulse: 93 91 89   Resp: 20 20 20    Temp:  98.1 F (36.7 C) 98.3 F (36.8 C) 98 F (36.7 C)  TempSrc:  Oral Oral Oral  SpO2: 97% 91% 95%   Weight:  109.1 kg    Height:  5\' 2"  (1.575 m)      Intake/Output Summary (Last 24 hours) at 05/27/2021 1139 Last data filed at 05/27/2021 0700 Gross per 24 hour  Intake 329.8 ml  Output 150 ml  Net 179.8 ml   Filed Weights   05/27/21 0041  Weight: 109.1 kg    Examination:  General exam: Appears calm and comfortable Respiratory system: Clear to auscultation. Respiratory effort normal. Cardiovascular system: S1 & S2 heard, RRR. No murmurs, rubs, gallops or clicks. Gastrointestinal system: Abdomen is nondistended, soft and diffusely moderately tender. No organomegaly or masses felt. Normal bowel sounds heard. Central nervous system: Alert and oriented. No focal neurological deficits. Musculoskeletal: No calf tenderness Skin: No cyanosis. No rashes Psychiatry: Judgement and insight appear normal. Mood & affect appropriate.     Data Reviewed: I have personally reviewed following labs and imaging studies  CBC Lab Results  Component Value Date   WBC 10.0 05/27/2021   RBC 3.74 (L) 05/27/2021   HGB 11.7 (L) 05/27/2021   HCT 35.2 (L) 05/27/2021   MCV 94.1 05/27/2021   MCH 31.3 05/27/2021   PLT 73 (L) 05/27/2021   MCHC 33.2 05/27/2021   RDW 14.7 05/27/2021   LYMPHSABS  0.4 (L) 05/26/2021   MONOABS 0.2 05/26/2021   EOSABS 0.1 05/26/2021   BASOSABS 0.0 05/26/2021     Last metabolic panel Lab Results  Component Value Date   NA 138 05/27/2021   K 2.8 (L) 05/27/2021   CL  105 05/27/2021   CO2 24 05/27/2021   BUN 22 05/27/2021   CREATININE 1.26 (H) 05/27/2021   GLUCOSE 187 (H) 05/27/2021   GFRNONAA 46 (L) 05/27/2021   GFRAA 61 11/07/2020   CALCIUM 8.1 (L) 05/27/2021   PROT 4.4 (L) 05/27/2021   ALBUMIN 2.2 (L) 05/27/2021   BILITOT 1.0 05/27/2021   ALKPHOS 52 05/27/2021   AST 13 (L) 05/27/2021   ALT 23 05/27/2021   ANIONGAP 9 05/27/2021    CBG (last 3)  Recent Labs    05/27/21 0040 05/27/21 0331 05/27/21 0812  GLUCAP 241* 178* 143*     GFR: Estimated Creatinine Clearance: 48.3 mL/min (A) (by C-G formula based on SCr of 1.26 mg/dL (H)).  Coagulation Profile: No results for input(s): INR, PROTIME in the last 168 hours.  Recent Results (from the past 240 hour(s))  Urine culture     Status: Abnormal   Collection Time: 05/23/21  7:44 PM   Specimen: Urine, Random  Result Value Ref Range Status   Specimen Description   Final    URINE, RANDOM Performed at [facility], [address], [place], [place] [address]    Special Requests   Final    NONE Performed at [facility], [address] [place], [place] [address]    Culture MULTIPLE SPECIES PRESENT, SUGGEST RECOLLECTION (A)  Final   Report Status 05/25/2021 FINAL  Final  Resp Panel by RT-PCR (Flu A&B, Covid) Nasopharyngeal Swab     Status: None   Collection Time: 05/26/21  5:19 PM   Specimen: Nasopharyngeal Swab; Nasopharyngeal(NP) swabs in vial transport medium  Result Value Ref Range Status   SARS Coronavirus 2 by RT PCR NEGATIVE NEGATIVE Final    Comment: (NOTE) SARS-CoV-2 target nucleic acids are NOT DETECTED.  The SARS-CoV-2 RNA is generally detectable in upper respiratory specimens during the acute phase of infection. The lowest concentration of SARS-CoV-2 viral copies this assay can detect is 138 copies/mL. A negative result does not preclude SARS-Cov-2 infection and should not be used as the sole basis for treatment or other patient  management decisions. A negative result may occur with  improper specimen collection/handling, submission of specimen other than nasopharyngeal swab, presence of viral mutation(s) within the areas targeted by this assay, and inadequate number of viral copies(<138 copies/mL). A negative result must be combined with clinical observations, patient history, and epidemiological information. The expected result is Negative.  Fact Sheet for Patients:  BloggerCourse.com  Fact Sheet for Healthcare Providers:  SeriousBroker.it  This test is no t yet approved or cleared by the [place] FDA and  has been authorized for detection and/or diagnosis of SARS-CoV-2 by FDA under an Emergency Use Authorization (EUA). This EUA will remain  in effect (meaning this test can be used) for the duration of the COVID-19 declaration under Section 564(b)(1) of the Act, 21 U.S.C.section 360bbb-3(b)(1), unless the authorization is terminated  or revoked sooner.       Influenza A by PCR NEGATIVE NEGATIVE Final   Influenza B by PCR NEGATIVE NEGATIVE Final    Comment: (NOTE) The Xpert Xpress SARS-CoV-2/FLU/RSV plus assay is intended as an aid in the diagnosis of influenza from Nasopharyngeal swab specimens and should not  be used as a sole basis for treatment. Nasal washings and aspirates are unacceptable for Xpert Xpress SARS-CoV-2/FLU/RSV testing.  Fact Sheet for Patients: BloggerCourse.com  Fact Sheet for Healthcare Providers: SeriousBroker.it  This test is not yet approved or cleared by the [place] FDA and has been authorized for detection and/or diagnosis of SARS-CoV-2 by FDA under an Emergency Use Authorization (EUA). This EUA will remain in effect (meaning this test can be used) for the duration of the COVID-19 declaration under Section 564(b)(1) of the Act, 21 U.S.C. section 360bbb-3(b)(1),  unless the authorization is terminated or revoked.  Performed at [facility], [address], [place], [place] [address]   Surgical PCR screen     Status: None   Collection Time: 05/27/21  1:29 AM   Specimen: Nasal Mucosa; Nasal Swab  Result Value Ref Range Status   MRSA, PCR NEGATIVE NEGATIVE Final   Staphylococcus aureus NEGATIVE NEGATIVE Final    Comment: (NOTE) The Xpert SA Assay (FDA approved for NASAL specimens in patients 53 years of age and older), is one component of a comprehensive surveillance program. It is not intended to diagnose infection nor to guide or monitor treatment. Performed at [facility], [address], [place], [place] [address]         Radiology Studies: DG Chest 2 View  Result Date: 05/26/2021 CLINICAL DATA:  Hypoxia. EXAM: CHEST - 2 VIEW COMPARISON:  May 23, 2021. FINDINGS: The heart size and mediastinal contours are within normal limits. Both lungs are clear. The visualized skeletal structures are unremarkable. IMPRESSION: No active cardiopulmonary disease. Electronically Signed   By: Lupita Raider M.D.   On: 05/26/2021 16:48   CT Abdomen Pelvis W Contrast  Result Date: 05/26/2021 CLINICAL DATA:  Two week history of abdominal pain, nausea, vomiting and diarrhea. Abdominal tenderness and hypotensive. EXAM: CT ABDOMEN AND PELVIS WITH CONTRAST TECHNIQUE: Multidetector CT imaging of the abdomen and pelvis was performed using the standard protocol following bolus administration of intravenous contrast. CONTRAST:  21mL OMNIPAQUE IOHEXOL 300 MG/ML  SOLN COMPARISON:  None. FINDINGS: Lower chest: The lung bases are clear of an acute process. No pleural effusions or pulmonary lesions. Findings of bilateral central and lower lobe pulmonary emboli are noted. A dedicated CT chest may be helpful for further evaluation but there does appear to be flattening of the left ventricle which could suggest right heart strain. Hepatobiliary: No hepatic lesions  or intrahepatic biliary dilatation. The gallbladder is grossly normal. No common bile duct dilatation. Pancreas: No mass, inflammation or ductal dilatation. Spleen: Normal size. No focal lesions. Adrenals/Urinary Tract: Adrenal glands and kidneys are unremarkable. The bladder is unremarkable. Stomach/Bowel: The stomach, duodenum and small bowel are unremarkable. No findings for small bowel obstruction. There are some mildly inflamed distal ileal loops of small bowel in the pelvis due to a large pelvic abscess. The terminal ileum is normal. The appendix is. There is sigmoid colon diverticulosis and I suspect there is perforated diverticulitis involving the mid sigmoid colon. There is a large complex abscess anterior to the uterus and in between distal small bowel loops. This measures a maximum of 9.2 x 5.8 cm. There is also some associated free pelvic fluid. Vascular/Lymphatic: Moderate scattered atherosclerotic changes involving the aorta and iliac arteries. No aneurysm. The major venous structures are patent. No mesenteric or retroperitoneal mass or adenopathy. No pelvic adenopathy or inguinal adenopathy. Reproductive: The uterus and ovaries are unremarkable. There are calcifications associated with both ovaries. Other: Small  amount of free pelvic fluid. Musculoskeletal: No significant bony findings. IMPRESSION: 1. CT findings consistent with perforated diverticulitis involving the mid sigmoid colon with a large (9.2 x 5.8 cm) complex abscess anterior to the uterus and in between distal small bowel loops. 2. Bilateral central and lower lobe pulmonary emboli. Dedicated CTA chest for further evaluation may be helpful if clinically necessary. There is flattening of the left ventricle which suggesting right heart strain. These results were called by telephone at the time of interpretation on 05/26/2021 at 8:05 pm to provider [facility] , who verbally acknowledged these results. Aortic Atherosclerosis (ICD10-I70.0).  Electronically Signed   By: Rudie MeyerP.  Gallerani M.D.   On: 05/26/2021 20:05        Scheduled Meds:  insulin aspart  0-9 Units Subcutaneous Q4H   mupirocin ointment  1 application Nasal BID   Continuous Infusions:  heparin 1,600 Units/hr (05/27/21 0555)   piperacillin-tazobactam (ZOSYN)  IV 3.375 g (05/27/21 0451)   potassium chloride       LOS: 1 day     Jacquelin Hawkingalph Evren Shankland, MD [facility] 05/27/2021, 11:39 AM  If 7PM-7AM, please contact night-coverage [email or web address]

## 2021-05-27 NOTE — Progress Notes (Signed)
  Echocardiogram 2D Echocardiogram has been performed.  Gerda Diss 05/27/2021, 4:21 PM

## 2021-05-27 NOTE — Progress Notes (Signed)
ANTICOAGULATION CONSULT NOTE  Pharmacy Consult for heparin Indication: pulmonary embolus  Allergies  Allergen Reactions   Gluten Meal     Celiac    Patient Measurements: Height: 5\' 2"  (157.5 cm) Weight: 109.1 kg (240 lb 8.4 oz) IBW/kg (Calculated) : 50.1 Heparin Dosing Weight: 77 kg  Vital Signs: Temp: 98.3 F (36.8 C) (06/11 1411) Temp Source: Oral (06/11 1411) BP: 117/68 (06/11 1411) Pulse Rate: 86 (06/11 1411)  Labs: Recent Labs    05/26/21 1601 05/27/21 0327 05/27/21 1352  HGB 13.1 11.7*  --   HCT 41.0 35.2*  --   PLT PLATELET CLUMPS NOTED ON SMEAR, UNABLE TO ESTIMATE 73*  --   HEPARINUNFRC  --  >1.10* >1.10*  CREATININE 1.28* 1.26*  --      Estimated Creatinine Clearance: 48.3 mL/min (A) (by C-G formula based on SCr of 1.26 mg/dL (H)).   Assessment: 86 YOF who acute bilateral PE to start IV heparin. H/H wnl, SCr 1.28.   Heparin level remains supratherapeutic (>1.1) on gtt at 1600 units/hr. No bleeding noted, Hgb stable, platelets are low. Spoke with RN who reports no bleeding or infusion problems. Spoke with phlebotomist who confirms level was drawn from opposite arm.  Goal of Therapy:  Heparin level 0.3-0.7 units/ml Monitor platelets by anticoagulation protocol: Yes   Plan:  Hold heparin x 1 hour Restart heparin drip at 1300 units/hr Will f/u 8 hr heparin level post restart Monitor for s/sx of bleeding  Thank you for involving pharmacy in this patient's care.  65, PharmD, BCPS Clinical Pharmacist Clinical phone for 05/27/2021 until 3p is [phone] 05/27/2021 3:15 PM  **Pharmacist phone directory can be found on [email or web address] listed under [facility]**

## 2021-05-27 NOTE — Progress Notes (Signed)
Subjective/Chief Complaint: Pt doing well C/o gen abd pain Awaiting IR proc   Objective: Vital signs in last 24 hours: Temp:  [98 F (36.7 C)-98.9 F (37.2 C)] 98 F (36.7 C) (06/11 0700) Pulse Rate:  [86-103] 89 (06/11 0320) Resp:  [16-29] 20 (06/11 0320) BP: (92-130)/(65-89) 96/67 (06/11 0700) SpO2:  [90 %-97 %] 95 % (06/11 0320) Weight:  [109.1 kg] 109.1 kg (06/11 0041) Last BM Date: 05/26/21 (diarrhea per pt report)  Intake/Output from previous day: 06/10 0701 - 06/11 0700 In: 329.8 [I.V.:204.4; IV Piggyback:125.4] Out: 150 [Urine:150] Intake/Output this shift: No intake/output data recorded.  PE:  Constitutional: No acute distress, conversant, appears states age. Eyes: Anicteric sclerae, moist conjunctiva, no lid lag Lungs: Clear to auscultation bilaterally, normal respiratory effort CV: regular rate and rhythm, no murmurs, no peripheral edema, pedal pulses 2+ GI: Soft, no masses or hepatosplenomegaly, gen tender to palpation Skin: No rashes, palpation reveals normal turgor Psychiatric: appropriate judgment and insight, oriented to person, place, and time   Lab Results:  Recent Labs    05/26/21 1601 05/27/21 0327  WBC 8.2 10.0  HGB 13.1 11.7*  HCT 41.0 35.2*  PLT PLATELET CLUMPS NOTED ON SMEAR, UNABLE TO ESTIMATE 73*   BMET Recent Labs    05/26/21 1601 05/27/21 0327  NA 134* 138  K 2.8* 2.8*  CL 98 105  CO2 24 24  GLUCOSE 287* 187*  BUN 23 22  CREATININE 1.28* 1.26*  CALCIUM 8.9 8.1*   PT/INR No results for input(s): LABPROT, INR in the last 72 hours. ABG No results for input(s): PHART, HCO3 in the last 72 hours.  Invalid input(s): PCO2, PO2  Studies/Results: DG Chest 2 View  Result Date: 05/26/2021 CLINICAL DATA:  Hypoxia. EXAM: CHEST - 2 VIEW COMPARISON:  May 23, 2021. FINDINGS: The heart size and mediastinal contours are within normal limits. Both lungs are clear. The visualized skeletal structures are unremarkable. IMPRESSION: No  active cardiopulmonary disease. Electronically Signed   By: Lupita Raider M.D.   On: 05/26/2021 16:48   CT Abdomen Pelvis W Contrast  Result Date: 05/26/2021 CLINICAL DATA:  Two week history of abdominal pain, nausea, vomiting and diarrhea. Abdominal tenderness and hypotensive. EXAM: CT ABDOMEN AND PELVIS WITH CONTRAST TECHNIQUE: Multidetector CT imaging of the abdomen and pelvis was performed using the standard protocol following bolus administration of intravenous contrast. CONTRAST:  89mL OMNIPAQUE IOHEXOL 300 MG/ML  SOLN COMPARISON:  None. FINDINGS: Lower chest: The lung bases are clear of an acute process. No pleural effusions or pulmonary lesions. Findings of bilateral central and lower lobe pulmonary emboli are noted. A dedicated CT chest may be helpful for further evaluation but there does appear to be flattening of the left ventricle which could suggest right heart strain. Hepatobiliary: No hepatic lesions or intrahepatic biliary dilatation. The gallbladder is grossly normal. No common bile duct dilatation. Pancreas: No mass, inflammation or ductal dilatation. Spleen: Normal size. No focal lesions. Adrenals/Urinary Tract: Adrenal glands and kidneys are unremarkable. The bladder is unremarkable. Stomach/Bowel: The stomach, duodenum and small bowel are unremarkable. No findings for small bowel obstruction. There are some mildly inflamed distal ileal loops of small bowel in the pelvis due to a large pelvic abscess. The terminal ileum is normal. The appendix is. There is sigmoid colon diverticulosis and I suspect there is perforated diverticulitis involving the mid sigmoid colon. There is a large complex abscess anterior to the uterus and in between distal small bowel loops. This measures a maximum of  9.2 x 5.8 cm. There is also some associated free pelvic fluid. Vascular/Lymphatic: Moderate scattered atherosclerotic changes involving the aorta and iliac arteries. No aneurysm. The major venous structures  are patent. No mesenteric or retroperitoneal mass or adenopathy. No pelvic adenopathy or inguinal adenopathy. Reproductive: The uterus and ovaries are unremarkable. There are calcifications associated with both ovaries. Other: Small amount of free pelvic fluid. Musculoskeletal: No significant bony findings. IMPRESSION: 1. CT findings consistent with perforated diverticulitis involving the mid sigmoid colon with a large (9.2 x 5.8 cm) complex abscess anterior to the uterus and in between distal small bowel loops. 2. Bilateral central and lower lobe pulmonary emboli. Dedicated CTA chest for further evaluation may be helpful if clinically necessary. There is flattening of the left ventricle which suggesting right heart strain. These results were called by telephone at the time of interpretation on 05/26/2021 at 8:05 pm to provider [facility] , who verbally acknowledged these results. Aortic Atherosclerosis (ICD10-I70.0). Electronically Signed   By: Rudie Meyer M.D.   On: 05/26/2021 20:05    Anti-infectives: Anti-infectives (From admission, onward)    Start     Dose/Rate Route Frequency Ordered Stop   05/27/21 0500  piperacillin-tazobactam (ZOSYN) IVPB 3.375 g        3.375 g 12.5 mL/hr over 240 Minutes Intravenous Every 8 hours 05/26/21 2126     05/26/21 2015  ceFEPIme (MAXIPIME) 2 g in sodium chloride 0.9 % 100 mL IVPB       See Hyperspace for full Linked Orders Report.   2 g 200 mL/hr over 30 Minutes Intravenous  Once 05/26/21 2004 05/26/21 2107   05/26/21 2015  metroNIDAZOLE (FLAGYL) IVPB 500 mg       See Hyperspace for full Linked Orders Report.   500 mg 100 mL/hr over 60 Minutes Intravenous  Once 05/26/21 2004 05/26/21 2135       Assessment/Plan: 7F Diverticulitis with abscess -npo, zosyn for complicated disease -IR to place drain -hopefully can avoid any surgery as this would be high risk  Bilateral PE -heparin IV in case needs procedures    LOS: 1 day    Axel Filler 05/27/2021

## 2021-05-27 NOTE — Progress Notes (Signed)
ANTICOAGULATION CONSULT NOTE  Pharmacy Consult for heparin Indication: pulmonary embolus  Allergies  Allergen Reactions   Gluten Meal     Celiac    Patient Measurements: Height: 5\' 2"  (157.5 cm) Weight: 109.1 kg (240 lb 8.4 oz) IBW/kg (Calculated) : 50.1 Heparin Dosing Weight: 78 kg  Vital Signs: Temp: 98.3 F (36.8 C) (06/11 0320) Temp Source: Oral (06/11 0320) BP: 106/65 (06/11 0320) Pulse Rate: 89 (06/11 0320)  Labs: Recent Labs    05/26/21 1601 05/27/21 0327  HGB 13.1  --   HCT 41.0  --   PLT PLATELET CLUMPS NOTED ON SMEAR, UNABLE TO ESTIMATE  --   HEPARINUNFRC  --  >1.10*  CREATININE 1.28*  --      Estimated Creatinine Clearance: 47.6 mL/min (A) (by C-G formula based on SCr of 1.28 mg/dL (H)).   Assessment: 27 YOF who acute bilateral PE to start IV heparin. H/H wnl, SCr 1.28.   Heparin level supratherapeutic (>1.1) on gtt at 1950 units/hr. No bleeding noted. RN confirmed that pt was stuck in arm opposite where heparin is running. Pt was started at elevated heparin rate (25 units/kg heparin dosing wt/hr - seems that initial heparin dosing weight used by my colleague may have been incorrect).  Goal of Therapy:  Heparin level 0.3-0.7 units/ml Monitor platelets by anticoagulation protocol: Yes   Plan:  Hold heparin x 1 hour Restart heparin at 1600 units/hr Will f/u 8 hr heparin level post restart  65, PharmD, BCPS Please see amion for complete clinical pharmacist phone list 05/27/2021 4:43 AM

## 2021-05-27 NOTE — Progress Notes (Signed)
Initial Nutrition Assessment  DOCUMENTATION CODES:   Obesity unspecified  INTERVENTION:   Hx of Celiac Disease; pt will require Gluten Free diet once diet advanced  Pending management of diverticulitis and ability to advance diet, may need to consider initiation of TPN given wt loss. RD to reassess on follow-up   NUTRITION DIAGNOSIS:   Inadequate oral intake related to altered GI function, acute illness as evidenced by NPO status.  GOAL:   Patient will meet greater than or equal to 90% of their needs  MONITOR:   Diet advancement, PO intake, Weight trends  REASON FOR ASSESSMENT:   Malnutrition Screening Tool    ASSESSMENT:   70 yo female admitted with abdominal pain, N/V/D x 2 weeks with perforated sigmoid diverticulitis with abscess., also with PE PMH includes CHF, HTN, DM, celiac disease  Currently NPO Medical management of diverticulitis at present; surgery following Noted plan for perc drain placement by IR today  Attempted to reach pt via phone but unsuccessful. Unable to obtain diet and weight history  Per weight encounters pt has experienced weight loss; 10.7% wt loss since November 2021.   Labs: potassium 2.8 (L), magnesium 1.4 (L), CBGs 143-241 Meds: ss novolog, mag sulfate    NUTRITION - FOCUSED PHYSICAL EXAM:  Unable to perform  Diet Order:   Diet Order             Diet NPO time specified  Diet effective now                   EDUCATION NEEDS:   Not appropriate for education at this time  Skin:  Skin Assessment: Reviewed RN Assessment  Last BM:  6/10  Height:   Ht Readings from Last 1 Encounters:  05/27/21 5\' 2"  (1.575 m)    Weight:   Wt Readings from Last 1 Encounters:  05/27/21 109.1 kg     BMI:  Body mass index is 43.99 kg/m.  Estimated Nutritional Needs:   Kcal:  2000-2200 kcals  Protein:  110-120 g  Fluid:  >/= 2 L   07/27/21 MS, RDN, LDN, CNSC Registered Dietitian III Clinical Nutrition RD Pager and  On-Call Pager Number Located in [place]

## 2021-05-28 ENCOUNTER — Inpatient Hospital Stay (HOSPITAL_COMMUNITY): Payer: Medicare HMO

## 2021-05-28 LAB — BASIC METABOLIC PANEL
Anion gap: 11 (ref 5–15)
BUN: 22 mg/dL (ref 8–23)
CO2: 22 mmol/L (ref 22–32)
Calcium: 8.1 mg/dL — ABNORMAL LOW (ref 8.9–10.3)
Chloride: 106 mmol/L (ref 98–111)
Creatinine, Ser: 1.23 mg/dL — ABNORMAL HIGH (ref 0.44–1.00)
GFR, Estimated: 47 mL/min — ABNORMAL LOW (ref >60)
Glucose, Bld: 137 mg/dL — ABNORMAL HIGH (ref 70–99)
Potassium: 2.8 mmol/L — ABNORMAL LOW (ref 3.5–5.1)
Sodium: 139 mmol/L (ref 135–145)

## 2021-05-28 LAB — HEPARIN LEVEL (UNFRACTIONATED)
Heparin Unfractionated: 1.02 IU/mL — ABNORMAL HIGH (ref 0.30–0.70)
Heparin Unfractionated: 0.44 IU/mL (ref 0.30–0.70)
Heparin Unfractionated: 0.39 IU/mL (ref 0.30–0.70)

## 2021-05-28 LAB — GLUCOSE, CAPILLARY
Glucose-Capillary: 111 mg/dL — ABNORMAL HIGH (ref 70–99)
Glucose-Capillary: 125 mg/dL — ABNORMAL HIGH (ref 70–99)
Glucose-Capillary: 131 mg/dL — ABNORMAL HIGH (ref 70–99)
Glucose-Capillary: 144 mg/dL — ABNORMAL HIGH (ref 70–99)
Glucose-Capillary: 149 mg/dL — ABNORMAL HIGH (ref 70–99)
Glucose-Capillary: 223 mg/dL — ABNORMAL HIGH (ref 70–99)
Glucose-Capillary: 255 mg/dL — ABNORMAL HIGH (ref 70–99)

## 2021-05-28 LAB — CBC
HCT: 33.7 % — ABNORMAL LOW (ref 36.0–46.0)
Hemoglobin: 10.9 g/dL — ABNORMAL LOW (ref 12.0–15.0)
MCH: 31.1 pg (ref 26.0–34.0)
MCHC: 32.3 g/dL (ref 30.0–36.0)
MCV: 96.3 fL (ref 80.0–100.0)
Platelets: 84 10*3/uL — ABNORMAL LOW (ref 150–400)
RBC: 3.5 MIL/uL — ABNORMAL LOW (ref 3.87–5.11)
RDW: 15.1 % (ref 11.5–15.5)
WBC: 10.1 10*3/uL (ref 4.0–10.5)
nRBC: 0 % (ref 0.0–0.2)

## 2021-05-28 LAB — MAGNESIUM: Magnesium: 1.7 mg/dL (ref 1.7–2.4)

## 2021-05-28 MED ORDER — MIDAZOLAM HCL 2 MG/2ML IJ SOLN
INTRAMUSCULAR | Status: AC
  Filled 2021-05-28: qty 2

## 2021-05-28 MED ORDER — POTASSIUM CHLORIDE 10 MEQ/100ML IV SOLN
10.0000 meq | INTRAVENOUS | Status: AC
  Administered 2021-05-28 (×6): 10 meq via INTRAVENOUS
  Filled 2021-05-28 (×6): qty 100

## 2021-05-28 MED ORDER — FENTANYL CITRATE (PF) 100 MCG/2ML IJ SOLN
INTRAMUSCULAR | Status: AC
  Filled 2021-05-28: qty 2

## 2021-05-28 MED ORDER — HEPARIN (PORCINE) 25000 UT/250ML-% IV SOLN
1250.0000 [IU]/h | INTRAVENOUS | Status: DC
  Administered 2021-05-28 (×2): 1050 [IU]/h via INTRAVENOUS
  Administered 2021-05-29 – 2021-05-31 (×3): 1100 [IU]/h via INTRAVENOUS
  Filled 2021-05-28 (×4): qty 250

## 2021-05-28 MED ORDER — LACTATED RINGERS IV SOLN: INTRAVENOUS | Status: DC

## 2021-05-28 NOTE — Procedures (Signed)
Interventional Radiology Procedure Note  Procedure:   Patient presents for attempt at CT guided abscess drain, pelvis.   Findings:  There is no safe window for percutaneous drainage.  The SB surrounds the fluid.    Withdrew from attempt.  Continue observation, with consideration of interval rescan.     Signed,  Yvone Neu. Loreta Ave, DO

## 2021-05-28 NOTE — Progress Notes (Addendum)
PROGRESS NOTE    [name]  QTM:[id number] DOB: 1951-01-26 DOA: 05/26/2021 PCP: Mckinley Jewel, MD   Brief Narrative: [name] is a 70 y.o. female with a history of diastolic heart failure, hypertension, sleep apnea. Patient presented secondary to abdominal pain and found to have evidence of diverticulitis with perforation and abscess; general surgery consulted and patient started on Zosyn IV. Patient found incidentally to have a large PE with features of right heart strain, started on Heparin IV.   Assessment & Plan:   Principal Problem:   Diverticulitis of colon with perforation Active Problems:   Chronic diastolic CHF (congestive heart failure) (HCC)   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 (HCC)   Moderate persistent asthma, uncomplicated   Seizure ([place])   Bilateral pulmonary embolism ([place])   Diverticulitis with perforation and abscess General surgery consulted and recommend IR consult for drain placement. Started empirically on Zosyn IV on admission. Afebrile. No leukocytosis. -Continue Zosyn IV -LR fluids until able to eat -Follow-up general surgery and IR recommendations: plan for drain placement  Bilateral pulmonary embolism No provoking factor identified. Slightly hypotensive on admission which resolved with IV fluids. Started on Heparin IV. CT suggests evidence of right heart strain. Heart ultrasound confirms evidence of right heart strain. Overall, vitals are not concerning. Discussed with PCCM who recommended to continue anticoagulation with recommendation for outpatient pulmonology follow-up with Dr. Silas Flood. -Continue Heparin IV -PT pending drain placement for above  Acute respiratory failure with hypoxia Secondary to PE. -Continue oxygen therapy -Wean to room air as able  Thrombocytopenia In setting of DVT and infection. Stable.  Chronic diastolic heart failure Stable.  CKD stage IIIa Chronic issue. Stable.  Primary  hypertension Patient is on amlodipine, Coreg as an outpatient. Antihypertensives held secondary to hypotension on admission. Blood pressure controlled currently. No tachycardia noted. -Continue to hold Coreg/Amlodipine  Hypokalemia Hypomagnesemia Given repletion. -Continue to replete as needed -BMP today with magnesium  Diabetes mellitus, type 2 -Continue SSI  Morbid obesity Body mass index is 43.99 kg/m.   DVT prophylaxis: Heparin IV Code Status:   Code Status: Full Code Family Communication: Daughter at bedside Disposition Plan: Discharge home vs SNF pending management of diverticulitis/abscess   Consultants:  General surgery Interventional radiology  Procedures:  None  Antimicrobials: Zosyn IV    Subjective: Abdominal pain is improved. No chest pain or dyspnea at rest.  Objective: Vitals:   05/27/21 2011 05/28/21 0009 05/28/21 0334 05/28/21 0806  BP: 123/62 (!) 151/138 112/85 120/72  Pulse: 87 88 83 82  Resp: (!) 24 (!) _0 Temp: 98.9 F (37.2 C)  98.7 F (37.1 C) 97.6 F (36.4 C)  TempSrc: Oral  Oral Oral  SpO2: 97% 96% 94% 97%  Weight:      Height:        Intake/Output Summary (Last 24 hours) at 05/28/2021 0904 Last data filed at 05/27/2021 2056 Gross per 24 hour  Intake --  Output 500 ml  Net -500 ml    Filed Weights   05/27/21 0041  Weight: 109.1 kg    Examination:  General exam: Appears calm and comfortable and in no acute distress. Conversant Respiratory: Clear to auscultation. Respiratory effort normal with no intercostal retractions or use of accessory muscles Cardiovascular: S1 & S2 heard, RRR. No murmurs, rubs, gallops or clicks. 1-2+ LE edema Gastrointestinal: Abdomen is Nondistended, soft and mildly tender. No masses felt. Normal bowel sounds heard Neurologic: No focal neurological deficits Musculoskeletal: No calf tenderness  Skin: No cyanosis. No new rashes Psychiatry: Alert and oriented. Memory intact. Mood & affect  appropriate    Data Reviewed: I have personally reviewed following labs and imaging studies  CBC Lab Results  Component Value Date   WBC 10.1 05/28/2021   RBC 3.50 (L) 05/28/2021   HGB 10.9 (L) 05/28/2021   HCT 33.7 (L) 05/28/2021   MCV 96.3 05/28/2021   MCH 31.1 05/28/2021   PLT 84 (L) 05/28/2021   MCHC 32.3 05/28/2021   RDW 15.1 05/28/2021   LYMPHSABS 0.4 (L) 05/26/2021   MONOABS 0.2 05/26/2021   EOSABS 0.1 05/26/2021   BASOSABS 0.0 16/06/3709     Last metabolic panel Lab Results  Component Value Date   NA 138 05/27/2021   K 2.8 (L) 05/27/2021   CL 105 05/27/2021   CO2 24 05/27/2021   BUN 22 05/27/2021   CREATININE 1.26 (H) 05/27/2021   GLUCOSE 187 (H) 05/27/2021   GFRNONAA 46 (L) 05/27/2021   GFRAA 61 11/07/2020   CALCIUM 8.1 (L) 05/27/2021   PROT 4.4 (L) 05/27/2021   ALBUMIN 2.2 (L) 05/27/2021   BILITOT 1.0 05/27/2021   ALKPHOS 52 05/27/2021   AST 13 (L) 05/27/2021   ALT 23 05/27/2021   ANIONGAP 9 05/27/2021    CBG (last 3)  Recent Labs    05/28/21 0022 05/28/21 0650 05/28/21 0832  GLUCAP 125* 111* 131*      GFR: Estimated Creatinine Clearance: 48.3 mL/min (A) (by C-G formula based on SCr of 1.26 mg/dL (H)).  Coagulation Profile: No results for input(s): INR, PROTIME in the last 168 hours.  Recent Results (from the past 240 hour(s))  Urine culture     Status: Abnormal   Collection Time: 05/23/21  7:44 PM   Specimen: Urine, Random  Result Value Ref Range Status   Specimen Description   Final    URINE, RANDOM Performed at [facility] [address] [place], [place] [address]    Special Requests   Final    NONE Performed at [facility], [place] [address], [place], [place] [address]    Culture MULTIPLE SPECIES PRESENT, SUGGEST RECOLLECTION (A)  Final   Report Status 05/25/2021 FINAL  Final  Resp Panel by RT-PCR (Flu A&B, Covid) Nasopharyngeal Swab     Status: None   Collection Time: 05/26/21  5:19 PM    Specimen: Nasopharyngeal Swab; Nasopharyngeal(NP) swabs in vial transport medium  Result Value Ref Range Status   SARS Coronavirus 2 by RT PCR NEGATIVE NEGATIVE Final    Comment: (NOTE) SARS-CoV-2 target nucleic acids are NOT DETECTED.  The SARS-CoV-2 RNA is generally detectable in upper respiratory specimens during the acute phase of infection. The lowest concentration of SARS-CoV-2 viral copies this assay can detect is 138 copies/mL. A negative result does not preclude SARS-Cov-2 infection and should not be used as the sole basis for treatment or other patient management decisions. A negative result may occur with  improper specimen collection/handling, submission of specimen other than nasopharyngeal swab, presence of viral mutation(s) within the areas targeted by this assay, and inadequate number of viral copies(<138 copies/mL). A negative result must be combined with clinical observations, patient history, and epidemiological information. The expected result is Negative.  Fact Sheet for Patients:  EntrepreneurPulse.com.au  Fact Sheet for Healthcare Providers:  IncredibleEmployment.be  This test is no t yet approved or cleared by the [place] FDA and  has been authorized for detection and/or diagnosis of SARS-CoV-2 by FDA under an Emergency  Use Authorization (EUA). This EUA will remain  in effect (meaning this test can be used) for the duration of the COVID-19 declaration under Section 564(b)(1) of the Act, 21 U.S.C.section 360bbb-3(b)(1), unless the authorization is terminated  or revoked sooner.       Influenza A by PCR NEGATIVE NEGATIVE Final   Influenza B by PCR NEGATIVE NEGATIVE Final    Comment: (NOTE) The Xpert Xpress SARS-CoV-2/FLU/RSV plus assay is intended as an aid in the diagnosis of influenza from Nasopharyngeal swab specimens and should not be used as a sole basis for treatment. Nasal washings and aspirates are  unacceptable for Xpert Xpress SARS-CoV-2/FLU/RSV testing.  Fact Sheet for Patients: EntrepreneurPulse.com.au  Fact Sheet for Healthcare Providers: IncredibleEmployment.be  This test is not yet approved or cleared by the [place] FDA and has been authorized for detection and/or diagnosis of SARS-CoV-2 by FDA under an Emergency Use Authorization (EUA). This EUA will remain in effect (meaning this test can be used) for the duration of the COVID-19 declaration under Section 564(b)(1) of the Act, 21 U.S.C. section 360bbb-3(b)(1), unless the authorization is terminated or revoked.  Performed at [facility], [place] [address], [place], [place] [address]   Surgical PCR screen     Status: None   Collection Time: 05/27/21  1:29 AM   Specimen: Nasal Mucosa; Nasal Swab  Result Value Ref Range Status   MRSA, PCR NEGATIVE NEGATIVE Final   Staphylococcus aureus NEGATIVE NEGATIVE Final    Comment: (NOTE) The Xpert SA Assay (FDA approved for NASAL specimens in patients 49 years of age and older), is one component of a comprehensive surveillance program. It is not intended to diagnose infection nor to guide or monitor treatment. Performed at [facility], [place] [address] [place], [place] [address]          Radiology Studies: DG Chest 2 View  Result Date: 05/26/2021 CLINICAL DATA:  Hypoxia. EXAM: CHEST - 2 VIEW COMPARISON:  May 23, 2021. FINDINGS: The heart size and mediastinal contours are within normal limits. Both lungs are clear. The visualized skeletal structures are unremarkable. IMPRESSION: No active cardiopulmonary disease. Electronically Signed   By: Marijo Conception M.D.   On: 05/26/2021 16:48   CT Abdomen Pelvis W Contrast  Result Date: 05/26/2021 CLINICAL DATA:  Two week history of abdominal pain, nausea, vomiting and diarrhea. Abdominal tenderness and hypotensive. EXAM: CT ABDOMEN AND PELVIS WITH CONTRAST TECHNIQUE:  Multidetector CT imaging of the abdomen and pelvis was performed using the standard protocol following bolus administration of intravenous contrast. CONTRAST:  82m OMNIPAQUE IOHEXOL 300 MG/ML  SOLN COMPARISON:  None. FINDINGS: Lower chest: The lung bases are clear of an acute process. No pleural effusions or pulmonary lesions. Findings of bilateral central and lower lobe pulmonary emboli are noted. A dedicated CT chest may be helpful for further evaluation but there does appear to be flattening of the left ventricle which could suggest right heart strain. Hepatobiliary: No hepatic lesions or intrahepatic biliary dilatation. The gallbladder is grossly normal. No common bile duct dilatation. Pancreas: No mass, inflammation or ductal dilatation. Spleen: Normal size. No focal lesions. Adrenals/Urinary Tract: Adrenal glands and kidneys are unremarkable. The bladder is unremarkable. Stomach/Bowel: The stomach, duodenum and small bowel are unremarkable. No findings for small bowel obstruction. There are some mildly inflamed distal ileal loops of small bowel in the pelvis due to a large pelvic abscess. The terminal ileum is normal. The appendix is. There is sigmoid colon diverticulosis and I  suspect there is perforated diverticulitis involving the mid sigmoid colon. There is a large complex abscess anterior to the uterus and in between distal small bowel loops. This measures a maximum of 9.2 x 5.8 cm. There is also some associated free pelvic fluid. Vascular/Lymphatic: Moderate scattered atherosclerotic changes involving the aorta and iliac arteries. No aneurysm. The major venous structures are patent. No mesenteric or retroperitoneal mass or adenopathy. No pelvic adenopathy or inguinal adenopathy. Reproductive: The uterus and ovaries are unremarkable. There are calcifications associated with both ovaries. Other: Small amount of free pelvic fluid. Musculoskeletal: No significant bony findings. IMPRESSION: 1. CT findings  consistent with perforated diverticulitis involving the mid sigmoid colon with a large (9.2 x 5.8 cm) complex abscess anterior to the uterus and in between distal small bowel loops. 2. Bilateral central and lower lobe pulmonary emboli. Dedicated CTA chest for further evaluation may be helpful if clinically necessary. There is flattening of the left ventricle which suggesting right heart strain. These results were called by telephone at the time of interpretation on 05/26/2021 at 8:05 pm to provider [facility] , who verbally acknowledged these results. Aortic Atherosclerosis (ICD10-I70.0). Electronically Signed   By: Marijo Sanes M.D.   On: 05/26/2021 20:05   ECHOCARDIOGRAM COMPLETE  Result Date: 05/27/2021    ECHOCARDIOGRAM REPORT   Patient Name:   [name] Date of Exam: 05/27/2021 Medical Rec #:  [id number]          Height:       62.0 in Accession #:    [id number]         Weight:       240.5 lb Date of Birth:  Mar 20, 1951         BSA:          2.068 m Patient Age:    69 years           BP:           117/68 mmHg Patient Gender: F                  HR:           86 bpm. Exam Location:  Inpatient Procedure: 2D Echo, Cardiac Doppler and Color Doppler STAT ECHO Indications:    Pulmonary embolus  History:        Patient has prior history of Echocardiogram examinations, most                 recent 01/02/2012. CHF; Risk Factors:Sleep Apnea, Hypertension                 and Diabetes.  Sonographer:    Clayton Lefort RDCS (AE) Referring Phys: [phone] Cruzita Lipa A Fendi Meinhardt  Sonographer Comments: Patient is morbidly obese. Image acquisition challenging due to patient body habitus. Image acquistion challening due to patient movement. IMPRESSIONS  1. Left ventricular ejection fraction, by estimation, is 65 to 70%. The left ventricle has normal function. The left ventricle has no regional wall motion abnormalities. There is severe concentric left ventricular hypertrophy. 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  2. RV  incompletely visualized. Based on available views, RV basal systolic function appears relatively preserved. The mid-to-apcial RV free wall appears mildly hypokinetic. The right ventricular size is mildly enlarged. There is severely elevated pulmonary artery systolic pressure. The estimated right ventricular systolic pressure is 65 mmHg.  3. The mitral valve is degenerative. Trivial mitral valve regurgitation. No evidence of mitral stenosis. Severe mitral annular calcification.  4. The  aortic valve is tricuspid. There is mild calcification of the aortic valve. There is mild thickening of the aortic valve. Aortic valve regurgitation is mild. Mild to moderate aortic valve sclerosis/calcification is present, without any evidence of aortic stenosis.  5. Aortic dilatation noted. There is mild dilatation of the ascending aorta, measuring 41 mm.  6. The inferior vena cava is normal in size with greater than 50% respiratory variability, suggesting right atrial pressure of 3 mmHg.  7. Left atrial size was mildly dilated.  8. Severely elevated PASP with mild RV dilation and systolic dysfunction suggestive of RV strain in the setting of acute PE. Comparison(s): Compared to prior TTE in 2013, the PASP is now severely elevated at 69mHg (previously normal) consistent with acute PE. FINDINGS  Left Ventricle: Left ventricular ejection fraction, by estimation, is 65 to 70%. The left ventricle has normal function. The left ventricle has no regional wall motion abnormalities. The left ventricular internal cavity size was small. There is severe concentric left ventricular hypertrophy. 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 Right Ventricle: RV incompletely visualized. The right ventricular size is mildly enlarged. Right vetricular wall thickness was not well visualized. Based on available views, RV basal systolic function appears relatively preserved. The mid free wall appears mildly hypokinetic on available views. There is  severely elevated pulmonary artery systolic pressure. The tricuspid regurgitant velocity is 3.87 m/s, and with an assumed right atrial pressure of 5 mmHg, the estimated right ventricular systolic pressure is 615.1mmHg. Left Atrium: Left atrial size was mildly dilated. Right Atrium: Right atrial size was normal in size. Pericardium: There is no evidence of pericardial effusion. Mitral Valve: The mitral valve is degenerative in appearance. There is moderate thickening of the mitral valve leaflet(s). There is moderate calcification of the mitral valve leaflet(s). Severe mitral annular calcification. Trivial mitral valve regurgitation. No evidence of mitral valve stenosis. Tricuspid Valve: The tricuspid valve is normal in structure. Tricuspid valve regurgitation is trivial. Aortic Valve: The aortic valve is tricuspid. There is mild calcification of the aortic valve. There is mild thickening of the aortic valve. Aortic valve regurgitation is mild. Aortic regurgitation PHT measures 635 msec. Mild to moderate aortic valve sclerosis/calcification is present, without any evidence of aortic stenosis. Aortic valve mean gradient measures 6.3 mmHg. Aortic valve peak gradient measures 10.6 mmHg. Aortic valve area, by VTI measures 2.75 cm. Pulmonic Valve: The pulmonic valve was grossly normal. Pulmonic valve regurgitation is trivial. Aorta: Aortic dilatation noted. There is mild dilatation of the ascending aorta, measuring 41 mm. Venous: The inferior vena cava is normal in size with greater than 50% respiratory variability, suggesting right atrial pressure of 3 mmHg. IAS/Shunts: No atrial level shunt detected by color flow Doppler.  LEFT VENTRICLE PLAX 2D LVIDd:         2.40 cm LVIDs:         1.80 cm LV PW:         1.90 cm LV IVS:        2.10 cm LVOT diam:     2.20 cm LV SV:         68 LV SV Index:   33 LVOT Area:     3.80 cm  RIGHT VENTRICLE             IVC RV Basal diam:  2.50 cm     IVC diam: 1.40 cm RV S prime:     13.70  cm/s TAPSE (M-mode): 2.7 cm LEFT ATRIUM  Index       RIGHT ATRIUM           Index LA diam:        3.40 cm 1.64 cm/m  RA Area:     13.60 cm LA Vol (A2C):   41.8 ml 20.22 ml/m RA Volume:   26.80 ml  12.96 ml/m LA Vol (A4C):   48.5 ml 23.46 ml/m LA Biplane Vol: 44.7 ml 21.62 ml/m  AORTIC VALVE AV Area (Vmax):    2.55 cm AV Area (Vmean):   2.78 cm AV Area (VTI):     2.75 cm AV Vmax:           162.67 cm/s AV Vmean:          115.667 cm/s AV VTI:            0.248 m AV Peak Grad:      10.6 mmHg AV Mean Grad:      6.3 mmHg LVOT Vmax:         109.00 cm/s LVOT Vmean:        84.567 cm/s LVOT VTI:          0.179 m LVOT/AV VTI ratio: 0.72 AI PHT:            635 msec  AORTA Ao Root diam: 3.20 cm Ao Asc diam:  4.10 cm TRICUSPID VALVE TR Peak grad:   59.9 mmHg TR Vmax:        387.00 cm/s  SHUNTS Systemic VTI:  0.18 m Systemic Diam: 2.20 cm Gwyndolyn Kaufman MD Electronically signed by Gwyndolyn Kaufman MD Signature Date/Time: 05/27/2021/4:43:56 PM    Final         Scheduled Meds:  insulin aspart  0-9 Units Subcutaneous Q4H   mupirocin ointment  1 application Nasal BID   Continuous Infusions:  heparin 1,050 Units/hr (05/28/21 0329)   lactated ringers     piperacillin-tazobactam (ZOSYN)  IV 3.375 g (05/27/21 2041)     LOS: 2 days     Cordelia Poche, MD [facility] 05/28/2021, 9:04 AM  If 7PM-7AM, please contact night-coverage [email or web address]

## 2021-05-28 NOTE — Progress Notes (Signed)
ANTICOAGULATION CONSULT NOTE  Pharmacy Consult for heparin Indication: pulmonary embolus  Allergies  Allergen Reactions   Gluten Meal     Celiac    Patient Measurements: Height: 5\' 2"  (157.5 cm) Weight: 109.1 kg (240 lb 8.4 oz) IBW/kg (Calculated) : 50.1 Heparin Dosing Weight: 77 kg  Vital Signs: Temp: 97.6 F (36.4 C) (06/12 0806) Temp Source: Oral (06/12 0806) BP: 121/96 (06/12 1135) Pulse Rate: 86 (06/12 1135)  Labs: Recent Labs    05/26/21 1601 05/26/21 1601 05/27/21 0327 05/27/21 1352 05/28/21 0031 05/28/21 0945 05/28/21 1028  HGB 13.1  --  11.7*  --  10.9*  --   --   HCT 41.0  --  35.2*  --  33.7*  --   --   PLT PLATELET CLUMPS NOTED ON SMEAR, UNABLE TO ESTIMATE  --  73*  --  84*  --   --   HEPARINUNFRC  --    < > >1.10* >1.10* 1.02*  --  0.44  CREATININE 1.28*  --  1.26*  --   --  1.23*  --    < > = values in this interval not displayed.     Estimated Creatinine Clearance: 49.5 mL/min (A) (by C-G formula based on SCr of 1.23 mg/dL (H)).   Assessment: 74 YOF who acute bilateral PE to start IV heparin. H/H wnl, SCr 1.28.   Heparin level is therapeutic (0.44) on gtt at 1050 units/hr. No bleeding noted, Hgb down 10.9, platelets are low but improved. After heparin level was drawn, the gtt was stopped for the drain to be placed.   Goal of Therapy:  Heparin level 0.3-0.7 units/ml Monitor platelets by anticoagulation protocol: Yes   Plan:  Resume heparin drip at 1050 units/hr once drain placed and ok to resume per IR Will check confirmatory 8 hr heparin level post restart Monitor for s/sx of bleeding  Thank you for involving pharmacy in this patient's care.  66, PharmD, BCPS Clinical Pharmacist Clinical phone for 05/28/2021 until 3p is 07/28/2021 05/28/2021 11:40 AM  **Pharmacist phone directory can be found on [email or web address] listed under [facility]**

## 2021-05-28 NOTE — Sedation Documentation (Signed)
Dr. Loreta Ave reviewed scan and has decided to not proceed with procedure. Will send patient back to her room.

## 2021-05-28 NOTE — Sedation Documentation (Signed)
Called and spoke to patient's nurse and let her know that procedure has been aborted. Sending patient back to room. Nothing further needed at this time.

## 2021-05-28 NOTE — Progress Notes (Signed)
ANTICOAGULATION CONSULT NOTE  Pharmacy Consult for heparin Indication: pulmonary embolus  Allergies  Allergen Reactions   Gluten Meal     Celiac    Patient Measurements: Height: 5\' 2"  (157.5 cm) Weight: 109.1 kg (240 lb 8.4 oz) IBW/kg (Calculated) : 50.1 Heparin Dosing Weight: 77 kg  Vital Signs: Temp: 98.9 F (37.2 C) (06/11 2011) Temp Source: Oral (06/11 2011) BP: 123/62 (06/11 2011) Pulse Rate: 87 (06/11 2011)  Labs: Recent Labs    05/26/21 1601 05/27/21 0327 05/27/21 1352 05/28/21 0031  HGB 13.1 11.7*  --   --   HCT 41.0 35.2*  --   --   PLT PLATELET CLUMPS NOTED ON SMEAR, UNABLE TO ESTIMATE 73*  --   --   HEPARINUNFRC  --  >1.10* >1.10* 1.02*  CREATININE 1.28* 1.26*  --   --      Estimated Creatinine Clearance: 48.3 mL/min (A) (by C-G formula based on SCr of 1.26 mg/dL (H)).   Assessment: [name] who acute bilateral PE to start IV heparin. H/H wnl, SCr 1.28.   Heparin level remains supratherapeutic (1.02) on gtt at 1300 units/hr. No bleeding noted, Hgb down to 10.9, plt down to 83 (plt 96 on admission). Spoke with RN who reports no bleeding or infusion problems. Level drawn appropriately.   Goal of Therapy:  Heparin level 0.3-0.7 units/ml Monitor platelets by anticoagulation protocol: Yes   Plan:  Hold heparin x 1 hour Restart heparin drip at 1050 units/hr Will f/u 8 hr heparin level post restart  66, PharmD, BCPS Please see amion for complete clinical pharmacist phone list 05/28/2021 2:10 AM

## 2021-05-28 NOTE — Progress Notes (Signed)
Subjective/Chief Complaint: Pt with less pain today IR drain resche'd for today  Objective: Vital signs in last 24 hours: Temp:  [97.6 F (36.4 C)-98.9 F (37.2 C)] 97.6 F (36.4 C) (06/12 0806) Pulse Rate:  [82-88] 82 (06/12 0806) Resp:  [16-24] 16 (06/12 0806) BP: (105-151)/(62-138) 120/72 (06/12 0806) SpO2:  [94 %-97 %] 97 % (06/12 0806) Last BM Date: 05/26/21 (diarrhea per pt report)  Intake/Output from previous day: 06/11 0701 - 06/12 0700 In: -  Out: 500 [Urine:500] Intake/Output this shift: No intake/output data recorded.  PE:   Constitutional: No acute distress, conversant, appears states age. Eyes: Anicteric sclerae, moist conjunctiva, no lid lag Lungs: Clear to auscultation bilaterally, normal respiratory effort CV: regular rate and rhythm, no murmurs, no peripheral edema, pedal pulses 2+ GI: Soft, no masses or hepatosplenomegaly, gen tender to palpation Skin: No rashes, palpation reveals normal turgor Psychiatric: appropriate judgment and insight, oriented to person, place, and time  Lab Results:  Recent Labs    05/27/21 0327 05/28/21 0031  WBC 10.0 10.1  HGB 11.7* 10.9*  HCT 35.2* 33.7*  PLT 73* 84*   BMET Recent Labs    05/26/21 1601 05/27/21 0327  NA 134* 138  K 2.8* 2.8*  CL 98 105  CO2 24 24  GLUCOSE 287* 187*  BUN 23 22  CREATININE 1.28* 1.26*  CALCIUM 8.9 8.1*   PT/INR No results for input(s): LABPROT, INR in the last 72 hours. ABG No results for input(s): PHART, HCO3 in the last 72 hours.  Invalid input(s): PCO2, PO2  Studies/Results: DG Chest 2 View  Result Date: 05/26/2021 CLINICAL DATA:  Hypoxia. EXAM: CHEST - 2 VIEW COMPARISON:  May 23, 2021. FINDINGS: The heart size and mediastinal contours are within normal limits. Both lungs are clear. The visualized skeletal structures are unremarkable. IMPRESSION: No active cardiopulmonary disease. Electronically Signed   By: Marijo Conception M.D.   On: 05/26/2021 16:48   CT Abdomen  Pelvis W Contrast  Result Date: 05/26/2021 CLINICAL DATA:  Two week history of abdominal pain, nausea, vomiting and diarrhea. Abdominal tenderness and hypotensive. EXAM: CT ABDOMEN AND PELVIS WITH CONTRAST TECHNIQUE: Multidetector CT imaging of the abdomen and pelvis was performed using the standard protocol following bolus administration of intravenous contrast. CONTRAST:  4m OMNIPAQUE IOHEXOL 300 MG/ML  SOLN COMPARISON:  None. FINDINGS: Lower chest: The lung bases are clear of an acute process. No pleural effusions or pulmonary lesions. Findings of bilateral central and lower lobe pulmonary emboli are noted. A dedicated CT chest may be helpful for further evaluation but there does appear to be flattening of the left ventricle which could suggest right heart strain. Hepatobiliary: No hepatic lesions or intrahepatic biliary dilatation. The gallbladder is grossly normal. No common bile duct dilatation. Pancreas: No mass, inflammation or ductal dilatation. Spleen: Normal size. No focal lesions. Adrenals/Urinary Tract: Adrenal glands and kidneys are unremarkable. The bladder is unremarkable. Stomach/Bowel: The stomach, duodenum and small bowel are unremarkable. No findings for small bowel obstruction. There are some mildly inflamed distal ileal loops of small bowel in the pelvis due to a large pelvic abscess. The terminal ileum is normal. The appendix is. There is sigmoid colon diverticulosis and I suspect there is perforated diverticulitis involving the mid sigmoid colon. There is a large complex abscess anterior to the uterus and in between distal small bowel loops. This measures a maximum of 9.2 x 5.8 cm. There is also some associated free pelvic fluid. Vascular/Lymphatic: Moderate scattered atherosclerotic changes involving  the aorta and iliac arteries. No aneurysm. The major venous structures are patent. No mesenteric or retroperitoneal mass or adenopathy. No pelvic adenopathy or inguinal adenopathy.  Reproductive: The uterus and ovaries are unremarkable. There are calcifications associated with both ovaries. Other: Small amount of free pelvic fluid. Musculoskeletal: No significant bony findings. IMPRESSION: 1. CT findings consistent with perforated diverticulitis involving the mid sigmoid colon with a large (9.2 x 5.8 cm) complex abscess anterior to the uterus and in between distal small bowel loops. 2. Bilateral central and lower lobe pulmonary emboli. Dedicated CTA chest for further evaluation may be helpful if clinically necessary. There is flattening of the left ventricle which suggesting right heart strain. These results were called by telephone at the time of interpretation on 05/26/2021 at 8:05 pm to provider [facility] , who verbally acknowledged these results. Aortic Atherosclerosis (ICD10-I70.0). Electronically Signed   By: Marijo Sanes M.D.   On: 05/26/2021 20:05   ECHOCARDIOGRAM COMPLETE  Result Date: 05/27/2021    ECHOCARDIOGRAM REPORT   Patient Name:   [name] Date of Exam: 05/27/2021 Medical Rec #:  [id number]          Height:       62.0 in Accession #:    [id number]         Weight:       240.5 lb Date of Birth:  Dec 12, 1951         BSA:          2.068 m Patient Age:    70 years           BP:           117/68 mmHg Patient Gender: F                  HR:           86 bpm. Exam Location:  Inpatient Procedure: 2D Echo, Cardiac Doppler and Color Doppler STAT ECHO Indications:    Pulmonary embolus  History:        Patient has prior history of Echocardiogram examinations, most                 recent 01/02/2012. CHF; Risk Factors:Sleep Apnea, Hypertension                 and Diabetes.  Sonographer:    Clayton Lefort RDCS (AE) Referring Phys: [phone] RALPH A NETTEY  Sonographer Comments: Patient is morbidly obese. Image acquisition challenging due to patient body habitus. Image acquistion challening due to patient movement. IMPRESSIONS  1. Left ventricular ejection fraction, by estimation, is 65 to  70%. The left ventricle has normal function. The left ventricle has no regional wall motion abnormalities. There is severe concentric left ventricular hypertrophy. 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  2. RV incompletely visualized. Based on available views, RV basal systolic function appears relatively preserved. The mid-to-apcial RV free wall appears mildly hypokinetic. The right ventricular size is mildly enlarged. There is severely elevated pulmonary artery systolic pressure. The estimated right ventricular systolic pressure is 65 mmHg.  3. The mitral valve is degenerative. Trivial mitral valve regurgitation. No evidence of mitral stenosis. Severe mitral annular calcification.  4. The aortic valve is tricuspid. There is mild calcification of the aortic valve. There is mild thickening of the aortic valve. Aortic valve regurgitation is mild. Mild to moderate aortic valve sclerosis/calcification is present, without any evidence of aortic stenosis.  5. Aortic dilatation noted. There is mild dilatation of the  ascending aorta, measuring 41 mm.  6. The inferior vena cava is normal in size with greater than 50% respiratory variability, suggesting right atrial pressure of 3 mmHg.  7. Left atrial size was mildly dilated.  8. Severely elevated PASP with mild RV dilation and systolic dysfunction suggestive of RV strain in the setting of acute PE. Comparison(s): Compared to prior TTE in 2013, the PASP is now severely elevated at 89mHg (previously normal) consistent with acute PE. FINDINGS  Left Ventricle: Left ventricular ejection fraction, by estimation, is 65 to 70%. The left ventricle has normal function. The left ventricle has no regional wall motion abnormalities. The left ventricular internal cavity size was small. There is severe concentric left ventricular hypertrophy. 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 Right Ventricle: RV incompletely visualized. The right ventricular size is mildly  enlarged. Right vetricular wall thickness was not well visualized. Based on available views, RV basal systolic function appears relatively preserved. The mid free wall appears mildly hypokinetic on available views. There is severely elevated pulmonary artery systolic pressure. The tricuspid regurgitant velocity is 3.87 m/s, and with an assumed right atrial pressure of 5 mmHg, the estimated right ventricular systolic pressure is 640.9mmHg. Left Atrium: Left atrial size was mildly dilated. Right Atrium: Right atrial size was normal in size. Pericardium: There is no evidence of pericardial effusion. Mitral Valve: The mitral valve is degenerative in appearance. There is moderate thickening of the mitral valve leaflet(s). There is moderate calcification of the mitral valve leaflet(s). Severe mitral annular calcification. Trivial mitral valve regurgitation. No evidence of mitral valve stenosis. Tricuspid Valve: The tricuspid valve is normal in structure. Tricuspid valve regurgitation is trivial. Aortic Valve: The aortic valve is tricuspid. There is mild calcification of the aortic valve. There is mild thickening of the aortic valve. Aortic valve regurgitation is mild. Aortic regurgitation PHT measures 635 msec. Mild to moderate aortic valve sclerosis/calcification is present, without any evidence of aortic stenosis. Aortic valve mean gradient measures 6.3 mmHg. Aortic valve peak gradient measures 10.6 mmHg. Aortic valve area, by VTI measures 2.75 cm. Pulmonic Valve: The pulmonic valve was grossly normal. Pulmonic valve regurgitation is trivial. Aorta: Aortic dilatation noted. There is mild dilatation of the ascending aorta, measuring 41 mm. Venous: The inferior vena cava is normal in size with greater than 50% respiratory variability, suggesting right atrial pressure of 3 mmHg. IAS/Shunts: No atrial level shunt detected by color flow Doppler.  LEFT VENTRICLE PLAX 2D LVIDd:         2.40 cm LVIDs:         1.80 cm LV PW:          1.90 cm LV IVS:        2.10 cm LVOT diam:     2.20 cm LV SV:         68 LV SV Index:   33 LVOT Area:     3.80 cm  RIGHT VENTRICLE             IVC RV Basal diam:  2.50 cm     IVC diam: 1.40 cm RV S prime:     13.70 cm/s TAPSE (M-mode): 2.7 cm LEFT ATRIUM             Index       RIGHT ATRIUM           Index LA diam:        3.40 cm 1.64 cm/m  RA Area:     13.60 cm LA  Vol [facility]):   41.8 ml 20.22 ml/m RA Volume:   26.80 ml  12.96 ml/m LA Vol (A4C):   48.5 ml 23.46 ml/m LA Biplane Vol: 44.7 ml 21.62 ml/m  AORTIC VALVE AV Area (Vmax):    2.55 cm AV Area (Vmean):   2.78 cm AV Area (VTI):     2.75 cm AV Vmax:           162.67 cm/s AV Vmean:          115.667 cm/s AV VTI:            0.248 m AV Peak Grad:      10.6 mmHg AV Mean Grad:      6.3 mmHg LVOT Vmax:         109.00 cm/s LVOT Vmean:        84.567 cm/s LVOT VTI:          0.179 m LVOT/AV VTI ratio: 0.72 AI PHT:            635 msec  AORTA Ao Root diam: 3.20 cm Ao Asc diam:  4.10 cm TRICUSPID VALVE TR Peak grad:   59.9 mmHg TR Vmax:        387.00 cm/s  SHUNTS Systemic VTI:  0.18 m Systemic Diam: 2.20 cm Gwyndolyn Kaufman MD Electronically signed by Gwyndolyn Kaufman MD Signature Date/Time: 05/27/2021/4:43:56 PM    Final     Anti-infectives: Anti-infectives (From admission, onward)    Start     Dose/Rate Route Frequency Ordered Stop   05/27/21 0500  piperacillin-tazobactam (ZOSYN) IVPB 3.375 g        3.375 g 12.5 mL/hr over 240 Minutes Intravenous Every 8 hours 05/26/21 2126     05/26/21 2015  ceFEPIme (MAXIPIME) 2 g in sodium chloride 0.9 % 100 mL IVPB       See Hyperspace for full Linked Orders Report.   2 g 200 mL/hr over 30 Minutes Intravenous  Once 05/26/21 2004 05/26/21 2107   05/26/21 2015  metroNIDAZOLE (FLAGYL) IVPB 500 mg       See Hyperspace for full Linked Orders Report.   500 mg 100 mL/hr over 60 Minutes Intravenous  Once 05/26/21 2004 05/26/21 2135       Assessment/Plan: 48F Diverticulitis with abscess -npo, zosyn for  complicated disease -IR to place drain -hopefully can avoid any surgery as this would be high risk -may be able to start clears Mon if con't to improve  Bilateral PE -heparin IV    LOS: 2 days    Ralene Ok 05/28/2021

## 2021-05-28 NOTE — Progress Notes (Signed)
ANTICOAGULATION CONSULT NOTE  Pharmacy Consult for heparin Indication: pulmonary embolus  Allergies  Allergen Reactions   Gluten Meal     Celiac    Patient Measurements: Height: 5\' 2"  (157.5 cm) Weight: 109.1 kg (240 lb 8.4 oz) IBW/kg (Calculated) : 50.1 Heparin Dosing Weight: 77 kg  Vital Signs: Temp: 98.2 F (36.8 C) (06/12 1935) Temp Source: Oral (06/12 1935) BP: 131/80 (06/12 1935) Pulse Rate: 93 (06/12 2051)  Labs: Recent Labs    05/26/21 1601 05/27/21 0327 05/27/21 1352 05/28/21 0031 05/28/21 0945 05/28/21 1028 05/28/21 2053  HGB 13.1 11.7*  --  10.9*  --   --   --   HCT 41.0 35.2*  --  33.7*  --   --   --   PLT PLATELET CLUMPS NOTED ON SMEAR, UNABLE TO ESTIMATE 73*  --  84*  --   --   --   HEPARINUNFRC  --  >1.10*   < > 1.02*  --  0.44 0.39  CREATININE 1.28* 1.26*  --   --  1.23*  --   --    < > = values in this interval not displayed.     Estimated Creatinine Clearance: 49.5 mL/min (A) (by C-G formula based on SCr of 1.23 mg/dL (H)).   Assessment: 62 YOF who acute bilateral PE 05/26/21, no RHS. No anticoagulation prior to admission. Pharmacy consulted for heparin.     Heparin gtt was held for 2.5 hrs for drain placement today, restarted at 12:45. HL drawn 8hr after was 0.39. Patient was previously supratherapeutic on 1300 units/hr. Will increase slightly to target higher end of goal given PE.   Goal of Therapy:  Heparin level 0.3-0.7 units/ml Monitor platelets by anticoagulation protocol: Yes   Plan:  Increase heparin drip to 1100 units/hr  Monitor daily HL, CBC/plt Monitor for signs/symptoms of bleeding  F/u long term plan   Thank you for involving pharmacy in this patient's care.  07/26/21, PharmD, BCPS, BCCP Clinical Pharmacist  Please check AMION for all [facility] Pharmacy phone numbers After 10:00 PM, call Main Pharmacy [phone]

## 2021-05-28 NOTE — Progress Notes (Signed)
Pt off unit to CT.   Zaydin Billey E Mamye Bolds, RN  

## 2021-05-28 NOTE — Progress Notes (Signed)
Received call from Dr. Loreta Ave with verbal order to stop heparin gtt in preparation for drain placement in IR. Heparin stopped and line flushed with 10 mL NS at this time.   Robina Ade, RN

## 2021-05-28 NOTE — Progress Notes (Addendum)
Heparin gtt resume at same rate of 10.5 after pt returned from aborted IR procedure. Confirmed with Dr. Caleb Popp before resuming.   Robina Ade, RN

## 2021-05-29 DIAGNOSIS — K5792 Diverticulitis of intestine, part unspecified, without perforation or abscess without bleeding: Secondary | ICD-10-CM

## 2021-05-29 LAB — HEPARIN LEVEL (UNFRACTIONATED): Heparin Unfractionated: 0.39 IU/mL (ref 0.30–0.70)

## 2021-05-29 LAB — BASIC METABOLIC PANEL
Anion gap: 11 (ref 5–15)
BUN: 13 mg/dL (ref 8–23)
CO2: 23 mmol/L (ref 22–32)
Calcium: 8 mg/dL — ABNORMAL LOW (ref 8.9–10.3)
Chloride: 102 mmol/L (ref 98–111)
Creatinine, Ser: 1.02 mg/dL — ABNORMAL HIGH (ref 0.44–1.00)
GFR, Estimated: 59 mL/min — ABNORMAL LOW (ref >60)
Glucose, Bld: 195 mg/dL — ABNORMAL HIGH (ref 70–99)
Potassium: 3.5 mmol/L (ref 3.5–5.1)
Sodium: 136 mmol/L (ref 135–145)

## 2021-05-29 LAB — GLUCOSE, CAPILLARY
Glucose-Capillary: 117 mg/dL — ABNORMAL HIGH (ref 70–99)
Glucose-Capillary: 128 mg/dL — ABNORMAL HIGH (ref 70–99)
Glucose-Capillary: 168 mg/dL — ABNORMAL HIGH (ref 70–99)
Glucose-Capillary: 198 mg/dL — ABNORMAL HIGH (ref 70–99)
Glucose-Capillary: 319 mg/dL — ABNORMAL HIGH (ref 70–99)
Glucose-Capillary: 228 mg/dL — ABNORMAL HIGH (ref 70–99)

## 2021-05-29 LAB — CBC
HCT: 33.3 % — ABNORMAL LOW (ref 36.0–46.0)
Hemoglobin: 10.6 g/dL — ABNORMAL LOW (ref 12.0–15.0)
MCH: 30.8 pg (ref 26.0–34.0)
MCHC: 31.8 g/dL (ref 30.0–36.0)
MCV: 96.8 fL (ref 80.0–100.0)
Platelets: 88 10*3/uL — ABNORMAL LOW (ref 150–400)
RBC: 3.44 MIL/uL — ABNORMAL LOW (ref 3.87–5.11)
RDW: 15.2 % (ref 11.5–15.5)
WBC: 9.1 10*3/uL (ref 4.0–10.5)
nRBC: 0 % (ref 0.0–0.2)

## 2021-05-29 MED ORDER — ENSURE ENLIVE PO LIQD
237.0000 mL | Freq: Two times a day (BID) | ORAL | Status: DC
  Administered 2021-05-30: 237 mL via ORAL

## 2021-05-29 MED ORDER — LEVOTHYROXINE SODIUM 112 MCG PO TABS
56.0000 ug | ORAL_TABLET | ORAL | Status: DC
  Administered 2021-06-04: 56 ug via ORAL
  Filled 2021-05-29 (×4): qty 1

## 2021-05-29 MED ORDER — PANTOPRAZOLE SODIUM 40 MG PO TBEC
40.0000 mg | DELAYED_RELEASE_TABLET | Freq: Every day | ORAL | Status: DC
  Administered 2021-05-30: 40 mg via ORAL
  Filled 2021-05-29: qty 1

## 2021-05-29 MED ORDER — PREDNISONE 20 MG PO TABS
20.0000 mg | ORAL_TABLET | Freq: Every day | ORAL | Status: DC
  Administered 2021-05-30: 20 mg via ORAL
  Filled 2021-05-29: qty 1

## 2021-05-29 MED ORDER — ROSUVASTATIN CALCIUM 5 MG PO TABS
5.0000 mg | ORAL_TABLET | Freq: Every day | ORAL | Status: DC
  Administered 2021-05-30: 5 mg via ORAL
  Filled 2021-05-29: qty 1

## 2021-05-29 MED ORDER — PREDNISONE 20 MG PO TABS: 40.0000 mg | ORAL_TABLET | Freq: Every day | ORAL | Status: DC

## 2021-05-29 MED ORDER — LEVOTHYROXINE SODIUM 112 MCG PO TABS
112.0000 ug | ORAL_TABLET | ORAL | Status: DC
  Administered 2021-05-30 – 2021-06-10 (×9): 112 ug via ORAL
  Filled 2021-05-29 (×8): qty 1

## 2021-05-29 NOTE — Progress Notes (Signed)
PROGRESS NOTE    [name]  EMV:[id number] DOB: Dec 13, 1951 DOA: 05/26/2021 PCP: Mckinley Jewel, MD   Brief Narrative: [name] is a 70 y.o. female with a history of diastolic heart failure, hypertension, sleep apnea. Patient presented secondary to abdominal pain and found to have evidence of diverticulitis with perforation and abscess; general surgery consulted and patient started on Zosyn IV. Patient found incidentally to have a large PE with features of right heart strain, started on Heparin IV.   Assessment & Plan:   Principal Problem:   Diverticulitis of colon with perforation Active Problems:   Chronic diastolic CHF (congestive heart failure) (HCC)   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 (HCC)   Moderate persistent asthma, uncomplicated   Seizure ([place])   Bilateral pulmonary embolism ([place])   Diverticulitis with perforation and abscess Mid-sigmoid colon abscess measures 9.2 cm x 5.8 cm. General surgery consulted and recommend IR consult for drain placement. Started empirically on Zosyn IV on admission. Afebrile. No leukocytosis. IR unable to find safe window for drain placement. General surgery recommending no surgery.  -Continue Zosyn IV -Consult ID  Bilateral pulmonary embolism No provoking factor identified. Slightly hypotensive on admission which resolved with IV fluids. Started on Heparin IV. CT suggests evidence of right heart strain. Heart ultrasound confirms evidence of right heart strain. Overall, vitals are not concerning. Discussed with PCCM who recommended to continue anticoagulation with recommendation for outpatient pulmonology follow-up with Dr. Silas Flood. -Continue Heparin IV. Switch to oral anticoagulation once surgical management plans have been solidified -PT eval  Acute respiratory failure with hypoxia Secondary to PE. -Continue oxygen therapy -Wean to room air as able  Thrombocytopenia In setting of DVT and  infection. Stable.  Chronic diastolic heart failure Stable.  CKD stage IIIa Chronic issue. Stable.  Primary hypertension Patient is on amlodipine, Coreg as an outpatient. Antihypertensives held secondary to hypotension on admission. Blood pressure controlled currently. No tachycardia noted. -Continue to hold Coreg/Amlodipine  Hypokalemia Hypomagnesemia Given repletion. -Continue to replete as needed -Repeat BMP today  Diabetes mellitus, type 2 -Continue SSI  Hypothyroidism Grave's disease with exophthalmos -Continue Synthroid -Continue home prednisone at planned reduced dose of 20 mg daily (patient previously taking 40 mg daily)  Morbid obesity Body mass index is 43.99 kg/m.   DVT prophylaxis: Heparin IV Code Status:   Code Status: Full Code Family Communication: Daughter on telephone Disposition Plan: Discharge home vs SNF pending management of diverticulitis/abscess   Consultants:  General surgery Interventional radiology Infectious disease  Procedures:  None  Antimicrobials: Zosyn IV    Subjective: Abdominal pain improved. No dyspnea or chest pain.  Objective: Vitals:   05/28/21 2051 05/28/21 2255 05/29/21 0253 05/29/21 0824  BP:  107/74 110/78 101/63  Pulse: 93 73 68 70  Resp: (!) 22 (!) _0 Temp:  99 F (37.2 C) 97.7 F (36.5 C) 98.8 F (37.1 C)  TempSrc:  Oral Oral Oral  SpO2: 97% 95% 97% 96%  Weight:      Height:        Intake/Output Summary (Last 24 hours) at 05/29/2021 1044 Last data filed at 05/29/2021 0434 Gross per 24 hour  Intake 1570.54 ml  Output 850 ml  Net 720.54 ml    Filed Weights   05/27/21 0041  Weight: 109.1 kg    Examination:  General exam: Appears calm and comfortable and in no acute distress. Conversant Respiratory: Clear to auscultation. Respiratory effort normal with no intercostal retractions or use of accessory  muscles Cardiovascular: S1 & S2 heard, RRR. No murmurs, rubs, gallops or clicks. No  edema Gastrointestinal: Abdomen is nondistended, soft and mild LLQ tenderness. No masses felt. Normal bowel sounds heard Neurologic: No focal neurological deficits Musculoskeletal: No calf tenderness Skin: No cyanosis. No new rashes Psychiatry: Alert and oriented. Memory intact. Mood & affect appropriate   Data Reviewed: I have personally reviewed following labs and imaging studies  CBC Lab Results  Component Value Date   WBC 9.1 05/29/2021   RBC 3.44 (L) 05/29/2021   HGB 10.6 (L) 05/29/2021   HCT 33.3 (L) 05/29/2021   MCV 96.8 05/29/2021   MCH 30.8 05/29/2021   PLT 88 (L) 05/29/2021   MCHC 31.8 05/29/2021   RDW 15.2 05/29/2021   LYMPHSABS 0.4 (L) 05/26/2021   MONOABS 0.2 05/26/2021   EOSABS 0.1 05/26/2021   BASOSABS 0.0 85/63/1497     Last metabolic panel Lab Results  Component Value Date   NA 139 05/28/2021   K 2.8 (L) 05/28/2021   CL 106 05/28/2021   CO2 22 05/28/2021   BUN 22 05/28/2021   CREATININE 1.23 (H) 05/28/2021   GLUCOSE 137 (H) 05/28/2021   GFRNONAA 47 (L) 05/28/2021   GFRAA 61 11/07/2020   CALCIUM 8.1 (L) 05/28/2021   PROT 4.4 (L) 05/27/2021   ALBUMIN 2.2 (L) 05/27/2021   BILITOT 1.0 05/27/2021   ALKPHOS 52 05/27/2021   AST 13 (L) 05/27/2021   ALT 23 05/27/2021   ANIONGAP 11 05/28/2021    CBG (last 3)  Recent Labs    05/28/21 2254 05/29/21 0329 05/29/21 0810  GLUCAP 223* 117* 128*      GFR: Estimated Creatinine Clearance: 49.5 mL/min (A) (by C-G formula based on SCr of 1.23 mg/dL (H)).  Coagulation Profile: No results for input(s): INR, PROTIME in the last 168 hours.  Recent Results (from the past 240 hour(s))  Urine culture     Status: Abnormal   Collection Time: 05/23/21  7:44 PM   Specimen: Urine, Random  Result Value Ref Range Status   Specimen Description   Final    URINE, RANDOM Performed at [facility] [address] [place], [place] [address]    Special Requests   Final    NONE Performed at  [facility], [place] [address], [place],  [address]    Culture MULTIPLE SPECIES PRESENT, SUGGEST RECOLLECTION (A)  Final   Report Status 05/25/2021 FINAL  Final  Resp Panel by RT-PCR (Flu A&B, Covid) Nasopharyngeal Swab     Status: None   Collection Time: 05/26/21  5:19 PM   Specimen: Nasopharyngeal Swab; Nasopharyngeal(NP) swabs in vial transport medium  Result Value Ref Range Status   SARS Coronavirus 2 by RT PCR NEGATIVE NEGATIVE Final    Comment: (NOTE) SARS-CoV-2 target nucleic acids are NOT DETECTED.  The SARS-CoV-2 RNA is generally detectable in upper respiratory specimens during the acute phase of infection. The lowest concentration of SARS-CoV-2 viral copies this assay can detect is 138 copies/mL. A negative result does not preclude SARS-Cov-2 infection and should not be used as the sole basis for treatment or other patient management decisions. A negative result may occur with  improper specimen collection/handling, submission of specimen other than nasopharyngeal swab, presence of viral mutation(s) within the areas targeted by this assay, and inadequate number of viral copies(<138 copies/mL). A negative result must be combined with clinical observations, patient history, and epidemiological information. The expected result is Negative.  Fact Sheet for Patients:  EntrepreneurPulse.com.au  Fact Sheet for Healthcare Providers:  IncredibleEmployment.be  This test is no t yet approved or cleared by the [place] FDA and  has been authorized for detection and/or diagnosis of SARS-CoV-2 by FDA under an Emergency Use Authorization (EUA). This EUA will remain  in effect (meaning this test can be used) for the duration of the COVID-19 declaration under Section 564(b)(1) of the Act, 21 U.S.C.section 360bbb-3(b)(1), unless the authorization is terminated  or revoked sooner.       Influenza A by PCR NEGATIVE  NEGATIVE Final   Influenza B by PCR NEGATIVE NEGATIVE Final    Comment: (NOTE) The Xpert Xpress SARS-CoV-2/FLU/RSV plus assay is intended as an aid in the diagnosis of influenza from Nasopharyngeal swab specimens and should not be used as a sole basis for treatment. Nasal washings and aspirates are unacceptable for Xpert Xpress SARS-CoV-2/FLU/RSV testing.  Fact Sheet for Patients: EntrepreneurPulse.com.au  Fact Sheet for Healthcare Providers: IncredibleEmployment.be  This test is not yet approved or cleared by the [place] FDA and has been authorized for detection and/or diagnosis of SARS-CoV-2 by FDA under an Emergency Use Authorization (EUA). This EUA will remain in effect (meaning this test can be used) for the duration of the COVID-19 declaration under Section 564(b)(1) of the Act, 21 U.S.C. section 360bbb-3(b)(1), unless the authorization is terminated or revoked.  Performed at [facility], [place] [address], [place], [place] [address]   Surgical PCR screen     Status: None   Collection Time: 05/27/21  1:29 AM   Specimen: Nasal Mucosa; Nasal Swab  Result Value Ref Range Status   MRSA, PCR NEGATIVE NEGATIVE Final   Staphylococcus aureus NEGATIVE NEGATIVE Final    Comment: (NOTE) The Xpert SA Assay (FDA approved for NASAL specimens in patients 80 years of age and older), is one component of a comprehensive surveillance program. It is not intended to diagnose infection nor to guide or monitor treatment. Performed at [facility], [place] [address], [place], [place] [address]          Radiology Studies: ECHOCARDIOGRAM COMPLETE  Result Date: 05/27/2021    ECHOCARDIOGRAM REPORT   Patient Name:   Neziah VAMPLE Turay Date of Exam: 05/27/2021 Medical Rec #:  [id number]          Height:       62.0 in Accession #:    [id number]         Weight:       240.5 lb Date of Birth:  10-30-1951         BSA:          2.068 m Patient Age:     58 years           BP:           117/68 mmHg Patient Gender: F                  HR:           86 bpm. Exam Location:  Inpatient Procedure: 2D Echo, Cardiac Doppler and Color Doppler STAT ECHO Indications:    Pulmonary embolus  History:        Patient has prior history of Echocardiogram examinations, most                 recent 01/02/2012. CHF; Risk Factors:Sleep Apnea, Hypertension                 and Diabetes.  Sonographer:    Arnell Sieving  Danelle Berry (AE) Referring Phys: [id number] Kynsie Falkner A Rayvon Dakin  Sonographer Comments: Patient is morbidly obese. Image acquisition challenging due to patient body habitus. Image acquistion challening due to patient movement. IMPRESSIONS  1. Left ventricular ejection fraction, by estimation, is 65 to 70%. The left ventricle has normal function. The left ventricle has no regional wall motion abnormalities. There is severe concentric left ventricular hypertrophy. 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  2. RV incompletely visualized. Based on available views, RV basal systolic function appears relatively preserved. The mid-to-apcial RV free wall appears mildly hypokinetic. The right ventricular size is mildly enlarged. There is severely elevated pulmonary artery systolic pressure. The estimated right ventricular systolic pressure is 65 mmHg.  3. The mitral valve is degenerative. Trivial mitral valve regurgitation. No evidence of mitral stenosis. Severe mitral annular calcification.  4. The aortic valve is tricuspid. There is mild calcification of the aortic valve. There is mild thickening of the aortic valve. Aortic valve regurgitation is mild. Mild to moderate aortic valve sclerosis/calcification is present, without any evidence of aortic stenosis.  5. Aortic dilatation noted. There is mild dilatation of the ascending aorta, measuring 41 mm.  6. The inferior vena cava is normal in size with greater than 50% respiratory variability, suggesting right atrial pressure of 3 mmHg.  7. Left  atrial size was mildly dilated.  8. Severely elevated PASP with mild RV dilation and systolic dysfunction suggestive of RV strain in the setting of acute PE. Comparison(s): Compared to prior TTE in 2013, the PASP is now severely elevated at 6mHg (previously normal) consistent with acute PE. FINDINGS  Left Ventricle: Left ventricular ejection fraction, by estimation, is 65 to 70%. The left ventricle has normal function. The left ventricle has no regional wall motion abnormalities. The left ventricular internal cavity size was small. There is severe concentric left ventricular hypertrophy. 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 Right Ventricle: RV incompletely visualized. The right ventricular size is mildly enlarged. Right vetricular wall thickness was not well visualized. Based on available views, RV basal systolic function appears relatively preserved. The mid free wall appears mildly hypokinetic on available views. There is severely elevated pulmonary artery systolic pressure. The tricuspid regurgitant velocity is 3.87 m/s, and with an assumed right atrial pressure of 5 mmHg, the estimated right ventricular systolic pressure is 678.9mmHg. Left Atrium: Left atrial size was mildly dilated. Right Atrium: Right atrial size was normal in size. Pericardium: There is no evidence of pericardial effusion. Mitral Valve: The mitral valve is degenerative in appearance. There is moderate thickening of the mitral valve leaflet(s). There is moderate calcification of the mitral valve leaflet(s). Severe mitral annular calcification. Trivial mitral valve regurgitation. No evidence of mitral valve stenosis. Tricuspid Valve: The tricuspid valve is normal in structure. Tricuspid valve regurgitation is trivial. Aortic Valve: The aortic valve is tricuspid. There is mild calcification of the aortic valve. There is mild thickening of the aortic valve. Aortic valve regurgitation is mild. Aortic regurgitation PHT measures 635  msec. Mild to moderate aortic valve sclerosis/calcification is present, without any evidence of aortic stenosis. Aortic valve mean gradient measures 6.3 mmHg. Aortic valve peak gradient measures 10.6 mmHg. Aortic valve area, by VTI measures 2.75 cm. Pulmonic Valve: The pulmonic valve was grossly normal. Pulmonic valve regurgitation is trivial. Aorta: Aortic dilatation noted. There is mild dilatation of the ascending aorta, measuring 41 mm. Venous: The inferior vena cava is normal in size with greater than 50% respiratory variability, suggesting right atrial pressure of 3  mmHg. IAS/Shunts: No atrial level shunt detected by color flow Doppler.  LEFT VENTRICLE PLAX 2D LVIDd:         2.40 cm LVIDs:         1.80 cm LV PW:         1.90 cm LV IVS:        2.10 cm LVOT diam:     2.20 cm LV SV:         68 LV SV Index:   33 LVOT Area:     3.80 cm  RIGHT VENTRICLE             IVC RV Basal diam:  2.50 cm     IVC diam: 1.40 cm RV S prime:     13.70 cm/s TAPSE (M-mode): 2.7 cm LEFT ATRIUM             Index       RIGHT ATRIUM           Index LA diam:        3.40 cm 1.64 cm/m  RA Area:     13.60 cm LA Vol (A2C):   41.8 ml 20.22 ml/m RA Volume:   26.80 ml  12.96 ml/m LA Vol (A4C):   48.5 ml 23.46 ml/m LA Biplane Vol: 44.7 ml 21.62 ml/m  AORTIC VALVE AV Area (Vmax):    2.55 cm AV Area (Vmean):   2.78 cm AV Area (VTI):     2.75 cm AV Vmax:           162.67 cm/s AV Vmean:          115.667 cm/s AV VTI:            0.248 m AV Peak Grad:      10.6 mmHg AV Mean Grad:      6.3 mmHg LVOT Vmax:         109.00 cm/s LVOT Vmean:        84.567 cm/s LVOT VTI:          0.179 m LVOT/AV VTI ratio: 0.72 AI PHT:            635 msec  AORTA Ao Root diam: 3.20 cm Ao Asc diam:  4.10 cm TRICUSPID VALVE TR Peak grad:   59.9 mmHg TR Vmax:        387.00 cm/s  SHUNTS Systemic VTI:  0.18 m Systemic Diam: 2.20 cm Gwyndolyn Kaufman MD Electronically signed by Gwyndolyn Kaufman MD Signature Date/Time: 05/27/2021/4:43:56 PM    Final    CT IMAGE GUIDED  DRAINAGE BY PERCUTANEOUS CATHETER  Result Date: 05/28/2021 INDICATION: 70 year old female presents for possible drainage of lower abdominal/pelvic abscess EXAM: CT-GUIDED DRAINAGE OF PELVIC ABSCESS MODIFIED EXAM DISCONTINUATION FOR INABILITY TO PERFORM ABSCESS DRAINAGE MEDICATIONS: None ANESTHESIA/SEDATION: None COMPLICATIONS: None TECHNIQUE: Informed written consent was obtained from the patient after a thorough discussion of the procedural risks, benefits and alternatives. All questions were addressed. Maximal Sterile Barrier Technique was utilized including caps, mask, sterile gowns, sterile gloves, sterile drape, hand hygiene and skin antiseptic. A timeout was performed prior to the initiation of the procedure. PROCEDURE: Patient was positioned supine position on the CT gantry table. Scout CT was performed for planning purposes. After careful review of the images, we withdrew from the procedure, as there is no safe window for access into the fluid collection at this time. FINDINGS: Fluid collection persists just superior to the urinary bladder and the uterus, with small bowel surrounding fluid and gas collection in  the central mesentery of the low abdomen. There is no safe window for percutaneous needle puncture. We withdrew from the attempt. IMPRESSION: Attempted CT-guided drainage of abdominal/pelvic abscess identifying no safe window for attempt at this time. Exam was discontinued. Signed, Dulcy Fanny. Dellia Nims, RPVI [facility] Radiology Electronically Signed   By: Corrie Mckusick D.O.   On: 05/28/2021 12:12        Scheduled Meds:  feeding supplement  237 mL Oral BID BM   insulin aspart  0-9 Units Subcutaneous Q4H   levothyroxine  112 mcg Oral Once per day on Mon Tue Wed Thu Fri Sat   And   [START ON 06/04/2021] levothyroxine  56 mcg Oral Once per day on Sun   mupirocin ointment  1 application Nasal BID   pantoprazole  40 mg Oral Daily   predniSONE   20 mg Oral Q breakfast   rosuvastatin  5 mg Oral Daily   Continuous Infusions:  heparin 1,100 Units/hr (05/29/21 0922)   piperacillin-tazobactam (ZOSYN)  IV 3.375 g (05/29/21 0434)     LOS: 3 days     Cordelia Poche, MD [facility] 05/29/2021, 10:44 AM  If 7PM-7AM, please contact night-coverage [email or web address]

## 2021-05-29 NOTE — Plan of Care (Signed)

## 2021-05-29 NOTE — Consult Note (Addendum)
[place]    Date of Admission:  05/26/2021     Reason for Consult: Intra-abdominal abscess     Referring Physician: Dr Lonny Prude  Current antibiotics: Pip tazo 6/10-- present  Previous antibiotics: Cefepime 6/10 x1 MTZ 6/10 x1  ASSESSMENT:    Intra-abdominal abscess: Secondary to perforated diverticulitis and measuring 9.2x5.8cm.  Not amenable to IR drainage and surgery hoping to avoid taking her to the OR due to high risk. Bilateral PE: Incidentally noted and currently on heparin gtt. CKD: Creatinine clearance is 49.5 with creatinine yesterday of 1.23. Diabetes: A1c is 9.4. Morbid obesity: Body mass index is 43.99 kg/m.  PLAN:    Continue current antibiotics.  IR with no safe windows in order to drain the pus and will be a challenging situation if she is deemed too high risk for surgery as no source control will likely lead to inability to cure this abscess Heparin gtt  Glycemic control   Principal Problem:   Diverticulitis of colon with perforation Active Problems:   Chronic diastolic CHF (congestive heart failure) (HCC)   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 (HCC)   Moderate persistent asthma, uncomplicated   Seizure (HCC)   Bilateral pulmonary embolism (HCC)   MEDICATIONS:    Scheduled Meds: . feeding supplement  237 mL Oral BID BM  . insulin aspart  0-9 Units Subcutaneous Q4H  . levothyroxine  112 mcg Oral Once per day on Mon Tue Wed Thu Fri Sat   And  . [START ON 06/04/2021] levothyroxine  56 mcg Oral Once per day on Sun  . mupirocin ointment  1 application Nasal BID  . pantoprazole  40 mg Oral Daily  . predniSONE  20 mg Oral Q breakfast  . rosuvastatin  5 mg Oral Daily   Continuous Infusions: . heparin 1,100 Units/hr (05/29/21 0922)  . piperacillin-tazobactam (ZOSYN)  IV 3.375 g (05/29/21 1250)   PRN Meds:.morphine injection  HPI:    [name] is a 70 y.o. female with history of HTN, OSA,  diabetes, obesity, HFpEF presented 05/26/21 with abdominal pain and found to have evidence of diverticulitis with perforation and abscess.  She was seen by general surgery who felt she would be too high risk for surgery and recommended IR consult for evaluation of drain placement.  Unfortunately, there was no safe window for IR to place a drain and so she is currently being managed with antibiotics alone.  Since admission, she has been afebrile and without leukocytosis.  She has occasional chills and her abdominal pain has improved although is not entirely resolved at this time.  She is now tolerating some PO intake.  She was incidentally also found to have bilateral PE and was started on a heparin gtt.  She has some mild shortness of breath at rest, but thinks that she would have more dyspnea on exertion if she were to get up and move around.  We have been consulted for antibiotic recommendations for intra-abdominal abscess.    Past Medical History:  Diagnosis Date  . Anemia   . Celiac disease   . CHF (congestive heart failure) ([place])   . Depression   . Diabetes mellitus without complication ([place])   . Edema    lower extremities  . Emphysema of lung ([place])   . Enlarged heart    begining stage  . Graves disease   . Headache(784.0) 05/18/2013  . Heart murmur   . Hematochezia   . Hemorrhoid   . Hyperlipidemia   .  Hypertension   . Migraine   . Obese   . OSA (obstructive sleep apnea)   . Seizures ([place])   . Sleep apnea   . Stroke [facility])    left P-O ICH 2007  . Thyroid disease    hypothyroidism  . Ulcer   . Vitamin D deficiency     Social History   Tobacco Use  . Smoking status: Never  . Smokeless tobacco: Never  Vaping Use  . Vaping Use: Never used  Substance Use Topics  . Alcohol use: No  . Drug use: No    Family History  Problem Relation Age of Onset  . Heart disease Father   . Diabetes Father   . Heart failure Father   . Diabetes Mother   . Thyroid disease Sister   .  Diabetes Brother     Allergies  Allergen Reactions  . Gluten Meal     Celiac    Review of Systems  Constitutional:  Positive for chills. Negative for fever.  HENT: Negative.    Eyes: Negative.   Respiratory:  Positive for shortness of breath. Negative for cough.   Cardiovascular:  Positive for leg swelling. Negative for chest pain.  Gastrointestinal:  Positive for abdominal pain. Negative for constipation, diarrhea, nausea and vomiting.  Genitourinary: Negative.   Musculoskeletal: Negative.   Skin: Negative.   Neurological: Negative.   All other systems reviewed and are negative.  OBJECTIVE:   Blood pressure 120/80, pulse 83, temperature 98 F (36.7 C), temperature source Oral, resp. rate 14, height _0  (1.575 m), weight 109.1 kg, SpO2 93 %. Body mass index is 43.99 kg/m.  Physical Exam Constitutional:      General: She is not in acute distress.    Appearance: She is well-developed.  HENT:     Head: Normocephalic and atraumatic.  Eyes:     Extraocular Movements: Extraocular movements intact.     Conjunctiva/sclera: Conjunctivae normal.  Cardiovascular:     Rate and Rhythm: Normal rate and regular rhythm.  Pulmonary:     Effort: No respiratory distress.     Comments: On nasal cannula. Abdominal:     General: Bowel sounds are normal.     Palpations: Abdomen is soft.     Tenderness: There is no abdominal tenderness. There is no guarding or rebound.  Musculoskeletal:        General: No swelling or tenderness. Normal range of motion.     Cervical back: Normal range of motion.     Right lower leg: Edema present.     Left lower leg: Edema present.  Skin:    General: Skin is warm and dry.     Findings: No rash.  Neurological:     General: No focal deficit present.     Mental Status: She is alert and oriented to person, place, and time.  Psychiatric:        Mood and Affect: Mood normal.        Behavior: Behavior normal.     Lab Results: Lab Results   Component Value Date   WBC 9.1 05/29/2021   HGB 10.6 (L) 05/29/2021   HCT 33.3 (L) 05/29/2021   MCV 96.8 05/29/2021   PLT 88 (L) 05/29/2021    Lab Results  Component Value Date   NA 139 05/28/2021   K 2.8 (L) 05/28/2021   CO2 22 05/28/2021   GLUCOSE 137 (H) 05/28/2021   BUN 22 05/28/2021   CREATININE 1.23 (H) 05/28/2021   CALCIUM 8.1 (  L) 05/28/2021   GFRNONAA 47 (L) 05/28/2021   GFRAA 61 11/07/2020    Lab Results  Component Value Date   ALT 23 05/27/2021   AST 13 (L) 05/27/2021   ALKPHOS 52 05/27/2021   BILITOT 1.0 05/27/2021    No results found for: CRP  No results found for: ESRSEDRATE  I have reviewed the micro and lab results in Epic.  Imaging: ECHOCARDIOGRAM COMPLETE  Result Date: 05/27/2021    ECHOCARDIOGRAM REPORT   Patient Name:   [name] Date of Exam: 05/27/2021 Medical Rec #:  [id number]          Height:       62.0 in Accession #:    [id number]         Weight:       240.5 lb Date of Birth:  03-29-1951         BSA:          2.068 m Patient Age:    47 years           BP:           117/68 mmHg Patient Gender: F                  HR:           86 bpm. Exam Location:  Inpatient Procedure: 2D Echo, Cardiac Doppler and Color Doppler STAT ECHO Indications:    Pulmonary embolus  History:        Patient has prior history of Echocardiogram examinations, most                 recent 01/02/2012. CHF; Risk Factors:Sleep Apnea, Hypertension                 and Diabetes.  Sonographer:    Clayton Lefort RDCS (AE) Referring Phys: [phone] RALPH A NETTEY  Sonographer Comments: Patient is morbidly obese. Image acquisition challenging due to patient body habitus. Image acquistion challening due to patient movement. IMPRESSIONS  1. Left ventricular ejection fraction, by estimation, is 65 to 70%. The left ventricle has normal function. The left ventricle has no regional wall motion abnormalities. There is severe concentric left ventricular hypertrophy. Diastolic function indeterminant due to  severe MAC.  2. RV incompletely visualized. Based on available views, RV basal systolic function appears relatively preserved. The mid-to-apcial RV free wall appears mildly hypokinetic. The right ventricular size is mildly enlarged. There is severely elevated pulmonary artery systolic pressure. The estimated right ventricular systolic pressure is 65 mmHg.  3. The mitral valve is degenerative. Trivial mitral valve regurgitation. No evidence of mitral stenosis. Severe mitral annular calcification.  4. The aortic valve is tricuspid. There is mild calcification of the aortic valve. There is mild thickening of the aortic valve. Aortic valve regurgitation is mild. Mild to moderate aortic valve sclerosis/calcification is present, without any evidence of aortic stenosis.  5. Aortic dilatation noted. There is mild dilatation of the ascending aorta, measuring 41 mm.  6. The inferior vena cava is normal in size with greater than 50% respiratory variability, suggesting right atrial pressure of 3 mmHg.  7. Left atrial size was mildly dilated.  8. Severely elevated PASP with mild RV dilation and systolic dysfunction suggestive of RV strain in the setting of acute PE. Comparison(s): Compared to prior TTE in 2013, the PASP is now severely elevated at 60mHg (previously normal) consistent with acute PE. FINDINGS  Left Ventricle: Left ventricular ejection fraction, by estimation, is 65 to 70%.  The left ventricle has normal function. The left ventricle has no regional wall motion abnormalities. The left ventricular internal cavity size was small. There is severe concentric left ventricular hypertrophy. 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 Right Ventricle: RV incompletely visualized. The right ventricular size is mildly enlarged. Right vetricular wall thickness was not well visualized. Based on available views, RV basal systolic function appears relatively preserved. The mid free wall appears mildly hypokinetic on  available views. There is severely elevated pulmonary artery systolic pressure. The tricuspid regurgitant velocity is 3.87 m/s, and with an assumed right atrial pressure of 5 mmHg, the estimated right ventricular systolic pressure is 54.6 mmHg. Left Atrium: Left atrial size was mildly dilated. Right Atrium: Right atrial size was normal in size. Pericardium: There is no evidence of pericardial effusion. Mitral Valve: The mitral valve is degenerative in appearance. There is moderate thickening of the mitral valve leaflet(s). There is moderate calcification of the mitral valve leaflet(s). Severe mitral annular calcification. Trivial mitral valve regurgitation. No evidence of mitral valve stenosis. Tricuspid Valve: The tricuspid valve is normal in structure. Tricuspid valve regurgitation is trivial. Aortic Valve: The aortic valve is tricuspid. There is mild calcification of the aortic valve. There is mild thickening of the aortic valve. Aortic valve regurgitation is mild. Aortic regurgitation PHT measures 635 msec. Mild to moderate aortic valve sclerosis/calcification is present, without any evidence of aortic stenosis. Aortic valve mean gradient measures 6.3 mmHg. Aortic valve peak gradient measures 10.6 mmHg. Aortic valve area, by VTI measures 2.75 cm. Pulmonic Valve: The pulmonic valve was grossly normal. Pulmonic valve regurgitation is trivial. Aorta: Aortic dilatation noted. There is mild dilatation of the ascending aorta, measuring 41 mm. Venous: The inferior vena cava is normal in size with greater than 50% respiratory variability, suggesting right atrial pressure of 3 mmHg. IAS/Shunts: No atrial level shunt detected by color flow Doppler.  LEFT VENTRICLE PLAX 2D LVIDd:         2.40 cm LVIDs:         1.80 cm LV PW:         1.90 cm LV IVS:        2.10 cm LVOT diam:     2.20 cm LV SV:         68 LV SV Index:   33 LVOT Area:     3.80 cm  RIGHT VENTRICLE             IVC RV Basal diam:  2.50 cm     IVC diam: 1.40 cm  RV S prime:     13.70 cm/s TAPSE (M-mode): 2.7 cm LEFT ATRIUM             Index       RIGHT ATRIUM           Index LA diam:        3.40 cm 1.64 cm/m  RA Area:     13.60 cm LA Vol (A2C):   41.8 ml 20.22 ml/m RA Volume:   26.80 ml  12.96 ml/m LA Vol (A4C):   48.5 ml 23.46 ml/m LA Biplane Vol: 44.7 ml 21.62 ml/m  AORTIC VALVE AV Area (Vmax):    2.55 cm AV Area (Vmean):   2.78 cm AV Area (VTI):     2.75 cm AV Vmax:           162.67 cm/s AV Vmean:          115.667 cm/s AV VTI:  0.248 m AV Peak Grad:      10.6 mmHg AV Mean Grad:      6.3 mmHg LVOT Vmax:         109.00 cm/s LVOT Vmean:        84.567 cm/s LVOT VTI:          0.179 m LVOT/AV VTI ratio: 0.72 AI PHT:            635 msec  AORTA Ao Root diam: 3.20 cm Ao Asc diam:  4.10 cm TRICUSPID VALVE TR Peak grad:   59.9 mmHg TR Vmax:        387.00 cm/s  SHUNTS Systemic VTI:  0.18 m Systemic Diam: 2.20 cm Gwyndolyn Kaufman MD Electronically signed by Gwyndolyn Kaufman MD Signature Date/Time: 05/27/2021/4:43:56 PM    Final    CT IMAGE GUIDED DRAINAGE BY PERCUTANEOUS CATHETER  Result Date: 05/28/2021 INDICATION: 70 year old female presents for possible drainage of lower abdominal/pelvic abscess EXAM: CT-GUIDED DRAINAGE OF PELVIC ABSCESS MODIFIED EXAM DISCONTINUATION FOR INABILITY TO PERFORM ABSCESS DRAINAGE MEDICATIONS: None ANESTHESIA/SEDATION: None COMPLICATIONS: None TECHNIQUE: Informed written consent was obtained from the patient after a thorough discussion of the procedural risks, benefits and alternatives. All questions were addressed. Maximal Sterile Barrier Technique was utilized including caps, mask, sterile gowns, sterile gloves, sterile drape, hand hygiene and skin antiseptic. A timeout was performed prior to the initiation of the procedure. PROCEDURE: Patient was positioned supine position on the CT gantry table. Scout CT was performed for planning purposes. After careful review of the images, we withdrew from the procedure, as there is no  safe window for access into the fluid collection at this time. FINDINGS: Fluid collection persists just superior to the urinary bladder and the uterus, with small bowel surrounding fluid and gas collection in the central mesentery of the low abdomen. There is no safe window for percutaneous needle puncture. We withdrew from the attempt. IMPRESSION: Attempted CT-guided drainage of abdominal/pelvic abscess identifying no safe window for attempt at this time. Exam was discontinued. Signed, Dulcy Fanny. Dellia Nims, RPVI [facility] Radiology Electronically Signed   By: Corrie Mckusick D.O.   On: 05/28/2021 12:12     Imaging independently reviewed in Epic.  [facility] [phone] pager 05/29/2021, 12:52 PM

## 2021-05-29 NOTE — Progress Notes (Signed)
Subjective/Chief Complaint: Denies abdominal pain, nausea or emesis. Denies flatus but states she had a BM yesterday. Tolerating CLD without abd pain N/V. Denies fever, chills, night sweats.  Her daughter is at the bedside.   Objective: Vital signs in last 24 hours: Temp:  [97.7 F (36.5 C)-99.1 F (37.3 C)] 98.8 F (37.1 C) (06/13 0824) Pulse Rate:  [68-93] 70 (06/13 0824) Resp:  [15-23] 16 (06/13 0824) BP: (101-131)/(63-96) 101/63 (06/13 0824) SpO2:  [95 %-97 %] 96 % (06/13 0824) Last BM Date: 05/26/21 (diarrhea per pt report)  Intake/Output from previous day: 06/12 0701 - 06/13 0700 In: 1570.5 [P.O.:480; I.V.:915.9; IV Piggyback:174.6] Out: 850 [Urine:850] Intake/Output this shift: No intake/output data recorded.  PE:   Constitutional: No acute distress, conversant, appears states age. Eyes: Anicteric sclerae, moist conjunctiva, no lid lag Lungs: Clear to auscultation bilaterally, normal respiratory effort CV: regular rate and rhythm, no murmurs, mild peripheral edema GI: Soft, obese, no masses or hepatosplenomegaly, non-tender to palpation Skin: No rashes, palpation reveals normal turgor Psychiatric: appropriate judgment and insight, oriented to person, place, and time  Lab Results:  Recent Labs    05/28/21 0031 05/29/21 0027  WBC 10.1 9.1  HGB 10.9* 10.6*  HCT 33.7* 33.3*  PLT 84* 88*   BMET Recent Labs    05/27/21 0327 05/28/21 0945  NA 138 139  K 2.8* 2.8*  CL 105 106  CO2 24 22  GLUCOSE 187* 137*  BUN 22 22  CREATININE 1.26* 1.23*  CALCIUM 8.1* 8.1*   PT/INR No results for input(s): LABPROT, INR in the last 72 hours. ABG No results for input(s): PHART, HCO3 in the last 72 hours.  Invalid input(s): PCO2, PO2  Studies/Results: ECHOCARDIOGRAM COMPLETE  Result Date: 05/27/2021    ECHOCARDIOGRAM REPORT   Patient Name:   [name] Date of Exam: 05/27/2021 Medical Rec #:  [id number]          Height:       62.0 in Accession #:     [id number]         Weight:       240.5 lb Date of Birth:  Jul 09, 1951         BSA:          2.068 m Patient Age:    37 years           BP:           117/68 mmHg Patient Gender: F                  HR:           86 bpm. Exam Location:  Inpatient Procedure: 2D Echo, Cardiac Doppler and Color Doppler STAT ECHO Indications:    Pulmonary embolus  History:        Patient has prior history of Echocardiogram examinations, most                 recent 01/02/2012. CHF; Risk Factors:Sleep Apnea, Hypertension                 and Diabetes.  Sonographer:    Clayton Lefort RDCS (AE) Referring Phys: [phone] RALPH A NETTEY  Sonographer Comments: Patient is morbidly obese. Image acquisition challenging due to patient body habitus. Image acquistion challening due to patient movement. IMPRESSIONS  1. Left ventricular ejection fraction, by estimation, is 65 to 70%. The left ventricle has normal function. The left ventricle has no regional wall motion abnormalities. There is severe concentric left  ventricular hypertrophy. 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  2. RV incompletely visualized. Based on available views, RV basal systolic function appears relatively preserved. The mid-to-apcial RV free wall appears mildly hypokinetic. The right ventricular size is mildly enlarged. There is severely elevated pulmonary artery systolic pressure. The estimated right ventricular systolic pressure is 65 mmHg.  3. The mitral valve is degenerative. Trivial mitral valve regurgitation. No evidence of mitral stenosis. Severe mitral annular calcification.  4. The aortic valve is tricuspid. There is mild calcification of the aortic valve. There is mild thickening of the aortic valve. Aortic valve regurgitation is mild. Mild to moderate aortic valve sclerosis/calcification is present, without any evidence of aortic stenosis.  5. Aortic dilatation noted. There is mild dilatation of the ascending aorta, measuring 41 mm.  6. The inferior vena cava is  normal in size with greater than 50% respiratory variability, suggesting right atrial pressure of 3 mmHg.  7. Left atrial size was mildly dilated.  8. Severely elevated PASP with mild RV dilation and systolic dysfunction suggestive of RV strain in the setting of acute PE. Comparison(s): Compared to prior TTE in 2013, the PASP is now severely elevated at 13mHg (previously normal) consistent with acute PE. FINDINGS  Left Ventricle: Left ventricular ejection fraction, by estimation, is 65 to 70%. The left ventricle has normal function. The left ventricle has no regional wall motion abnormalities. The left ventricular internal cavity size was small. There is severe concentric left ventricular hypertrophy. 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 Right Ventricle: RV incompletely visualized. The right ventricular size is mildly enlarged. Right vetricular wall thickness was not well visualized. Based on available views, RV basal systolic function appears relatively preserved. The mid free wall appears mildly hypokinetic on available views. There is severely elevated pulmonary artery systolic pressure. The tricuspid regurgitant velocity is 3.87 m/s, and with an assumed right atrial pressure of 5 mmHg, the estimated right ventricular systolic pressure is 678.2mmHg. Left Atrium: Left atrial size was mildly dilated. Right Atrium: Right atrial size was normal in size. Pericardium: There is no evidence of pericardial effusion. Mitral Valve: The mitral valve is degenerative in appearance. There is moderate thickening of the mitral valve leaflet(s). There is moderate calcification of the mitral valve leaflet(s). Severe mitral annular calcification. Trivial mitral valve regurgitation. No evidence of mitral valve stenosis. Tricuspid Valve: The tricuspid valve is normal in structure. Tricuspid valve regurgitation is trivial. Aortic Valve: The aortic valve is tricuspid. There is mild calcification of the aortic valve.  There is mild thickening of the aortic valve. Aortic valve regurgitation is mild. Aortic regurgitation PHT measures 635 msec. Mild to moderate aortic valve sclerosis/calcification is present, without any evidence of aortic stenosis. Aortic valve mean gradient measures 6.3 mmHg. Aortic valve peak gradient measures 10.6 mmHg. Aortic valve area, by VTI measures 2.75 cm. Pulmonic Valve: The pulmonic valve was grossly normal. Pulmonic valve regurgitation is trivial. Aorta: Aortic dilatation noted. There is mild dilatation of the ascending aorta, measuring 41 mm. Venous: The inferior vena cava is normal in size with greater than 50% respiratory variability, suggesting right atrial pressure of 3 mmHg. IAS/Shunts: No atrial level shunt detected by color flow Doppler.  LEFT VENTRICLE PLAX 2D LVIDd:         2.40 cm LVIDs:         1.80 cm LV PW:         1.90 cm LV IVS:        2.10 cm LVOT diam:  2.20 cm LV SV:         68 LV SV Index:   33 LVOT Area:     3.80 cm  RIGHT VENTRICLE             IVC RV Basal diam:  2.50 cm     IVC diam: 1.40 cm RV S prime:     13.70 cm/s TAPSE (M-mode): 2.7 cm LEFT ATRIUM             Index       RIGHT ATRIUM           Index LA diam:        3.40 cm 1.64 cm/m  RA Area:     13.60 cm LA Vol (A2C):   41.8 ml 20.22 ml/m RA Volume:   26.80 ml  12.96 ml/m LA Vol (A4C):   48.5 ml 23.46 ml/m LA Biplane Vol: 44.7 ml 21.62 ml/m  AORTIC VALVE AV Area (Vmax):    2.55 cm AV Area (Vmean):   2.78 cm AV Area (VTI):     2.75 cm AV Vmax:           162.67 cm/s AV Vmean:          115.667 cm/s AV VTI:            0.248 m AV Peak Grad:      10.6 mmHg AV Mean Grad:      6.3 mmHg LVOT Vmax:         109.00 cm/s LVOT Vmean:        84.567 cm/s LVOT VTI:          0.179 m LVOT/AV VTI ratio: 0.72 AI PHT:            635 msec  AORTA Ao Root diam: 3.20 cm Ao Asc diam:  4.10 cm TRICUSPID VALVE TR Peak grad:   59.9 mmHg TR Vmax:        387.00 cm/s  SHUNTS Systemic VTI:  0.18 m Systemic Diam: 2.20 cm Gwyndolyn Kaufman MD  Electronically signed by Gwyndolyn Kaufman MD Signature Date/Time: 05/27/2021/4:43:56 PM    Final    CT IMAGE GUIDED DRAINAGE BY PERCUTANEOUS CATHETER  Result Date: 05/28/2021 INDICATION: 70 year old female presents for possible drainage of lower abdominal/pelvic abscess EXAM: CT-GUIDED DRAINAGE OF PELVIC ABSCESS MODIFIED EXAM DISCONTINUATION FOR INABILITY TO PERFORM ABSCESS DRAINAGE MEDICATIONS: None ANESTHESIA/SEDATION: None COMPLICATIONS: None TECHNIQUE: Informed written consent was obtained from the patient after a thorough discussion of the procedural risks, benefits and alternatives. All questions were addressed. Maximal Sterile Barrier Technique was utilized including caps, mask, sterile gowns, sterile gloves, sterile drape, hand hygiene and skin antiseptic. A timeout was performed prior to the initiation of the procedure. PROCEDURE: Patient was positioned supine position on the CT gantry table. Scout CT was performed for planning purposes. After careful review of the images, we withdrew from the procedure, as there is no safe window for access into the fluid collection at this time. FINDINGS: Fluid collection persists just superior to the urinary bladder and the uterus, with small bowel surrounding fluid and gas collection in the central mesentery of the low abdomen. There is no safe window for percutaneous needle puncture. We withdrew from the attempt. IMPRESSION: Attempted CT-guided drainage of abdominal/pelvic abscess identifying no safe window for attempt at this time. Exam was discontinued. Signed, Dulcy Fanny. Dellia Nims, RPVI [facility] Electronically Signed   By: Corrie Mckusick D.O.   On: 05/28/2021 12:12  Anti-infectives: Anti-infectives (From admission, onward)    Start     Dose/Rate Route Frequency Ordered Stop   05/27/21 0500  piperacillin-tazobactam (ZOSYN) IVPB 3.375 g        3.375 g 12.5 mL/hr over 240 Minutes Intravenous  Every 8 hours 05/26/21 2126     05/26/21 2015  ceFEPIme (MAXIPIME) 2 g in sodium chloride 0.9 % 100 mL IVPB       See Hyperspace for full Linked Orders Report.   2 g 200 mL/hr over 30 Minutes Intravenous  Once 05/26/21 2004 05/26/21 2107   05/26/21 2015  metroNIDAZOLE (FLAGYL) IVPB 500 mg       See Hyperspace for full Linked Orders Report.   500 mg 100 mL/hr over 60 Minutes Intravenous  Once 05/26/21 2004 05/26/21 2135       Assessment/Plan: 1F Diverticulitis with abscess (9x5 cm) -AFVSS, WBC 10  - IR without a safe window for abscess drainage. - clinically improving. No pain. Having bowel function. Tolerating clears. -hopefully can avoid any surgery as this would be high risk   FEN: tolerating CLD since yesterday at lunchtime. Advance to FLD + ensure. Would not advance past FLD for now.  YS:AYTKZ VTE: SCD's, hep gtt Foley: none, external cath Dispo: SDU    - Below per primary service - Bilateral PE-heparin IV  Acute respiratory failure with hypoxia Thombocytopenia Chronic diastolic heart failure CKD IIIa HTN DM2 Morbid obesity    LOS: 3 days    Jill Alexanders 05/29/2021

## 2021-05-29 NOTE — Progress Notes (Signed)
ANTICOAGULATION CONSULT NOTE - Follow Up Consult  Pharmacy Consult for Heparin Indication: pulmonary embolus  Allergies  Allergen Reactions   Gluten Meal     Celiac    Patient Measurements: Height: 5\' 2"  (157.5 cm) Weight: 109.1 kg (240 lb 8.4 oz) IBW/kg (Calculated) : 50.1 Heparin Dosing Weight:  76.6 kg  Vital Signs: Temp: 98.8 F (37.1 C) (06/13 0824) Temp Source: Oral (06/13 0824) BP: 101/63 (06/13 0824) Pulse Rate: 70 (06/13 0824)  Labs: Recent Labs    05/26/21 1601 05/27/21 0327 05/27/21 1352 05/28/21 0031 05/28/21 0945 05/28/21 1028 05/28/21 2053 05/29/21 0027  HGB 13.1 11.7*  --  10.9*  --   --   --  10.6*  HCT 41.0 35.2*  --  33.7*  --   --   --  33.3*  PLT PLATELET CLUMPS NOTED ON SMEAR, UNABLE TO ESTIMATE 73*  --  84*  --   --   --  88*  HEPARINUNFRC  --  >1.10*   < > 1.02*  --  0.44 0.39 0.39  CREATININE 1.28* 1.26*  --   --  1.23*  --   --   --    < > = values in this interval not displayed.    Estimated Creatinine Clearance: 49.5 mL/min (A) (by C-G formula based on SCr of 1.23 mg/dL (H)).   Assessment:  Anticoag: IV heparin for acute BL PE - Hep level 0.39 in goal. Hgb 10.6. Plts only 88.  Goal of Therapy:  Heparin level 0.3-0.7 units/ml Monitor platelets by anticoagulation protocol: Yes   Plan:  Heparin drip at 1100 units/hr Monitor daily HL, CBC and s/s of bleeding  Zosyn 3.375g IV q8hr.   Noeh Sparacino S. 05/31/21, PharmD, BCPS Clinical Staff Pharmacist [email or web address] Merilynn Finland, Danamarie Minami Stillinger 05/29/2021,11:29 AM

## 2021-05-29 NOTE — Progress Notes (Signed)
Nutrition Follow-up  DOCUMENTATION CODES:   Obesity unspecified  INTERVENTION:   - Hx of Celiac Disease; pt will require gluten-free diet once diet advanced  - Ensure Enlive po BID, each supplement provides 350 kcal and 20 grams of protein  - Encourage PO intake  NUTRITION DIAGNOSIS:   Inadequate oral intake related to altered GI function, acute illness as evidenced by NPO status.  Progressing with diet advancement  GOAL:   Patient will meet greater than or equal to 90% of their needs  Progressing  MONITOR:   Diet advancement, PO intake, Weight trends  REASON FOR ASSESSMENT:   Malnutrition Screening Tool    ASSESSMENT:   70 year old female admitted with abdominal pain, N/V/D x 2 weeks with perforated sigmoid diverticulitis with abscess, also with PE. PMH includes CHF, HTN, DM, celiac disease.  6/12 - clear liquids 6/13 - full liquids  Per notes, pt presented to IR on 6/12 for attempt at CT guided abscess drain. However, procedure was aborted as there was no safe window for percutaneous drainage.  Diet advanced to full liquids this morning.  Spoke with pt and daughter at bedside. Pt reports that she is excited to have a full liquid diet and be allowed more options at mealtimes. She reports doing well on clear liquid diet yesterday and consumed jello, broth, and tea. She reports tolerating these items well and denies any N/V at this time.  Pt endorses a 43 lb weight loss but unsure of timeframe. Daughter guesses that weight loss has occurred over the last 6 months. Pt reports decreased appetite during this timeframe and only eating 2 meals daily. Pt reports that her family has been making her eat. For breakfast, pt typically eats liver pudding and eggs but does not finish the portions. Pt eats a variety of other foods at her second meal and consumes 1-2 Glucerna Shakes daily. Daughter reports that pt uses Glucerna Shakes as a meal replacement.  Pt's daughter shares of  difficulties pt has had recently with remembering medications and taking medications. Pt with hypothyroidism but apparently developed hyperthyroidism per daughter which has contributed to her weight loss. Pt was put on insulin due to prednisone requirements but has not been taking insulin consistently. Pt also not taking Nexium which daughter reports was the initial cause of her abdominal pain and N/V.  Reviewed weight history in chart. Pt with a total weight loss of 13.2 kg since 11/07/20. This is a 10.8% weight loss in 7 months which is not quite significant for timeframe. Pt is at risk for malnutrition but does not meet criteria at this time.  Surgery has ordered Ensure Enlive BID for pt. Pt willing to consume these supplements. RD will monitor for further diet advancement and adjust supplement regimen as appropriate.  Medications reviewed and include: Ensure Enlive BID, SSI q 4 hours, protonix, prednisone, IV abx, heparin gtt  Labs reviewed: potassium 2.8, creatinine 1.23 CBG's: 117-255 x 24 hours  UOP: 850 ml x 24 hours I/O's: +400 ml since admit  NUTRITION - FOCUSED PHYSICAL EXAM:  Flowsheet Row Most Recent Value  Orbital Region No depletion  Upper Arm Region No depletion  Thoracic and Lumbar Region No depletion  Buccal Region Mild depletion  Temple Region No depletion  Clavicle Bone Region Mild depletion  Clavicle and Acromion Bone Region Mild depletion  Scapular Bone Region No depletion  Dorsal Hand No depletion  Patellar Region No depletion  Anterior Thigh Region No depletion  Posterior Calf Region No depletion  Edema (  RD Assessment) Mild  [BLE]  Hair Reviewed  Eyes Reviewed  Mouth Reviewed  Skin Reviewed  Nails Reviewed       Diet Order:   Diet Order             Diet full liquid Room service appropriate? Yes; Fluid consistency: Thin  Diet effective now                   EDUCATION NEEDS:   Not appropriate for education at this time  Skin:  Skin  Assessment: Reviewed RN Assessment  Last BM:  05/28/21 smear type 6  Height:   Ht Readings from Last 1 Encounters:  05/27/21 5\' 2"  (1.575 m)    Weight:   Wt Readings from Last 1 Encounters:  05/27/21 109.1 kg    BMI:  Body mass index is 43.99 kg/m.  Estimated Nutritional Needs:   Kcal:  2000-2200 kcals  Protein:  110-120 g  Fluid:  >/= 2 L    07/27/21, MS, RD, LDN Inpatient Clinical Dietitian Please see AMiON for contact information.

## 2021-05-29 NOTE — Care Management Important Message (Signed)
Important Message  Patient Details  Name: [name] MRN: [id number] Date of Birth: [birth year]   Medicare Important Message Given:  Yes     Lanecia Sliva Stefan Church 05/29/2021, 3:45 PM

## 2021-05-30 LAB — BASIC METABOLIC PANEL
Anion gap: 8 (ref 5–15)
BUN: 16 mg/dL (ref 8–23)
CO2: 24 mmol/L (ref 22–32)
Calcium: 8 mg/dL — ABNORMAL LOW (ref 8.9–10.3)
Chloride: 103 mmol/L (ref 98–111)
Creatinine, Ser: 0.96 mg/dL (ref 0.44–1.00)
GFR, Estimated: >60 mL/min (ref >60)
Glucose, Bld: 230 mg/dL — ABNORMAL HIGH (ref 70–99)
Potassium: 3.4 mmol/L — ABNORMAL LOW (ref 3.5–5.1)
Sodium: 135 mmol/L (ref 135–145)

## 2021-05-30 LAB — CBC
HCT: 33.8 % — ABNORMAL LOW (ref 36.0–46.0)
Hemoglobin: 11 g/dL — ABNORMAL LOW (ref 12.0–15.0)
MCH: 31.3 pg (ref 26.0–34.0)
MCHC: 32.5 g/dL (ref 30.0–36.0)
MCV: 96 fL (ref 80.0–100.0)
Platelets: 111 10*3/uL — ABNORMAL LOW (ref 150–400)
RBC: 3.52 MIL/uL — ABNORMAL LOW (ref 3.87–5.11)
RDW: 15.4 % (ref 11.5–15.5)
WBC: 9.6 10*3/uL (ref 4.0–10.5)
nRBC: 0 % (ref 0.0–0.2)

## 2021-05-30 LAB — GLUCOSE, CAPILLARY
Glucose-Capillary: 219 mg/dL — ABNORMAL HIGH (ref 70–99)
Glucose-Capillary: 169 mg/dL — ABNORMAL HIGH (ref 70–99)
Glucose-Capillary: 207 mg/dL — ABNORMAL HIGH (ref 70–99)
Glucose-Capillary: 236 mg/dL — ABNORMAL HIGH (ref 70–99)
Glucose-Capillary: 329 mg/dL — ABNORMAL HIGH (ref 70–99)
Glucose-Capillary: 302 mg/dL — ABNORMAL HIGH (ref 70–99)

## 2021-05-30 LAB — OCCULT BLOOD X 1 CARD TO LAB, STOOL: Fecal Occult Bld: POSITIVE — AB

## 2021-05-30 LAB — HEMOGLOBIN AND HEMATOCRIT, BLOOD
HCT: 33.5 % — ABNORMAL LOW (ref 36.0–46.0)
Hemoglobin: 10.6 g/dL — ABNORMAL LOW (ref 12.0–15.0)

## 2021-05-30 LAB — HEPARIN LEVEL (UNFRACTIONATED): Heparin Unfractionated: 0.34 IU/mL (ref 0.30–0.70)

## 2021-05-30 LAB — C DIFFICILE QUICK SCREEN W PCR REFLEX
C Diff antigen: NEGATIVE
C Diff interpretation: NOT DETECTED
C Diff toxin: NEGATIVE

## 2021-05-30 MED ORDER — INSULIN ASPART 100 UNIT/ML IJ SOLN
0.0000 [IU] | Freq: Three times a day (TID) | INTRAMUSCULAR | Status: DC
  Administered 2021-05-30: 5 [IU] via SUBCUTANEOUS
  Administered 2021-05-30: 3 [IU] via SUBCUTANEOUS
  Administered 2021-05-30: 5 [IU] via SUBCUTANEOUS
  Administered 2021-05-31: 11 [IU] via SUBCUTANEOUS

## 2021-05-30 MED ORDER — ZINC OXIDE 40 % EX OINT
TOPICAL_OINTMENT | Freq: Every day | CUTANEOUS | Status: DC | PRN
  Filled 2021-05-30 (×2): qty 57

## 2021-05-30 MED ORDER — PANTOPRAZOLE SODIUM 40 MG PO TBEC
40.0000 mg | DELAYED_RELEASE_TABLET | Freq: Two times a day (BID) | ORAL | Status: DC
  Administered 2021-05-30 – 2021-06-13 (×25): 40 mg via ORAL
  Filled 2021-05-30 (×27): qty 1

## 2021-05-30 MED ORDER — POTASSIUM CHLORIDE CRYS ER 20 MEQ PO TBCR
40.0000 meq | EXTENDED_RELEASE_TABLET | Freq: Once | ORAL | Status: AC
  Administered 2021-05-30: 40 meq via ORAL
  Filled 2021-05-30: qty 2

## 2021-05-30 NOTE — Progress Notes (Signed)
Inpatient Rehab Admissions Coordinator Note:   Per therapy recommendations, pt was screened for CIR candidacy by Billy Turvey, MS CCC-SLP. At this time, Pt. Appears to have functional decline and is a potential  candidate for CIR. Will place order for rehab consult per protocol.  Please contact me with questions.   Fouad Taul, MS, CCC-SLP Rehab Admissions Coordinator  [phone] (celll) [phone] (office)  

## 2021-05-30 NOTE — Plan of Care (Signed)

## 2021-05-30 NOTE — Evaluation (Signed)
Physical Therapy Evaluation Patient Details Name: [name] MRN: [id number] DOB: 06-29-1951 Today's Date: 05/30/2021   History of Present Illness  Ms. Holtzclaw is a 70 y.o. female admitted on 05/26/21 for sigmoid diverticulitis with perforation and abscess, IR consulted for surgery vs drain placement and had an unsucessful attempt on 05/28/21.  Bil PE started on IV heparin.  Pt with significant PMH of anemia, CHF, DM, graves disease, HTN, obesity, stroke, seizure, and thyroid disease.  Clinical Impression  Although alert and oriented, pt seems quite confused at times, keeping her eyes closed most of the session.  Her daughter was not present during my assessment and was not reachable by phone to get an accurate sense of pt's baseline.  She was able to stand and transfer OOB to the recliner chair with two person hand held assist.  I do not feel she is currently safe to walk any distance mostly due to confusion, lethargy.   She will likely benefit from post acute rehab at discharge.   PT to follow acutely for deficits listed below.       Follow Up Recommendations CIR    Equipment Recommendations  Rolling walker with 5" wheels;Wheelchair (measurements PT);Wheelchair cushion (measurements PT);Hospital bed    Recommendations for Other Services Rehab consult     Precautions / Restrictions Precautions Precautions: Fall      Mobility  Bed Mobility Overal bed mobility: Needs Assistance Bed Mobility: Supine to Sit     Supine to sit: Mod assist;HOB elevated     General bed mobility comments: Mod assist to help move bil LEs to EOB, cues for hand placement and hand over hand assist to obtain, most assist needed to lift trunk up and scoot hips to EOB from maximally elevated HOB.    Transfers Overall transfer level: Needs assistance Equipment used: Rolling walker (2 wheeled);2 person hand held assist Transfers: Sit to/from [employer] Sit to Stand: Mod assist;+2 physical  assistance Stand pivot transfers: Mod assist;+2 physical assistance       General transfer comment: Two person mod assist to stand from bed to RW, pt leans over on elbows on narrow handles of RW, so sat back down and transferred to chair with two person hand held assist (felt to be safer), stood for ~2 mins for peri care and pad change, third person needed for pericare.  Ambulation/Gait             General Gait Details: too weak at the moment  Stairs            Wheelchair Mobility    Modified Rankin (Stroke Patients Only)       Balance Overall balance assessment: Needs assistance Sitting-balance support: Feet supported;Bilateral upper extremity supported Sitting balance-Leahy Scale: Poor     Standing balance support: Bilateral upper extremity supported Standing balance-Leahy Scale: Poor Standing balance comment: reliant on support in standing.                             Pertinent Vitals/Pain Pain Assessment: Faces Faces Pain Scale: Hurts whole lot Pain Location: bottom with peri care Pain Descriptors / Indicators: Guarding;Grimacing Pain Intervention(s): Limited activity within patient's tolerance;Monitored during session;Repositioned    Home Living Family/patient expects to be discharged to:: Private residence Living Arrangements: Alone                    Prior Function Level of Independence: Needs assistance   Gait /  Transfers Assistance Needed: Pt reports she walks with a cane, unclear as to if this is both community and home use or just community.  She reports "someone has to help me move my limbs"  ADL's / Homemaking Assistance Needed: per pt report she was recently independent, does not drive but can drive.        Hand Dominance   Dominant Hand: Right    Extremity/Trunk Assessment   Upper Extremity Assessment Upper Extremity Assessment: Generalized weakness    Lower Extremity Assessment Lower Extremity Assessment:  Generalized weakness    Cervical / Trunk Assessment Cervical / Trunk Assessment: Other exceptions Cervical / Trunk Exceptions: forward flexed in standing.  Communication   Communication: No difficulties  Cognition Arousal/Alertness: Lethargic Behavior During Therapy:  (emotionally labile) Overall Cognitive Status: Impaired/Different from baseline Area of Impairment: Problem solving;Memory                     Memory: Decreased short-term memory       Problem Solving: Difficulty sequencing;Requires verbal cues;Requires tactile cues General Comments: Pt alert and oriented x 3 (a littlie vague on her situation), however, gets tearful and then angry in a matter of seconds, talking about things that don't always make sense.  Eyes closed throughout session "I prefer it that way"      General Comments General comments (skin integrity, edema, etc.): Took 2 L O2 off during mobility and pt's O2 sats in th elow 90s on RA.  O2 Tillman re-applied at end of session.    Exercises     Assessment/Plan    PT Assessment Patient needs continued PT services  PT Problem List Decreased strength;Decreased activity tolerance;Decreased balance;Decreased mobility;Decreased cognition;Decreased knowledge of use of DME;Decreased safety awareness;Decreased knowledge of precautions;Cardiopulmonary status limiting activity;Obesity;Pain;Decreased skin integrity       PT Treatment Interventions DME instruction;Gait training;Stair training;Functional mobility training;Therapeutic exercise;Therapeutic activities;Balance training;Cognitive remediation;Patient/family education    PT Goals (Current goals can be found in the Care Plan section)  Acute Rehab PT Goals Patient Stated Goal: to figure out what is wrong with her PT Goal Formulation: With patient Time For Goal Achievement: 06/13/21 Potential to Achieve Goals: Good    Frequency Min 3X/week   Barriers to discharge        Co-evaluation                AM-PAC PT "6 Clicks" Mobility  Outcome Measure Help needed turning from your back to your side while in a flat bed without using bedrails?: A Lot Help needed moving from lying on your back to sitting on the side of a flat bed without using bedrails?: A Lot Help needed moving to and from a bed to a chair (including a wheelchair)?: Total Help needed standing up from a chair using your arms (e.g., wheelchair or bedside chair)?: Total Help needed to walk in hospital room?: Total Help needed climbing 3-5 steps with a railing? : Total 6 Click Score: 8    End of Session Equipment Utilized During Treatment: Gait belt Activity Tolerance: Patient limited by pain;Patient limited by lethargy;Patient limited by fatigue Patient left: in chair;with call bell/phone within reach;with chair alarm set   PT Visit Diagnosis: Muscle weakness (generalized) (M62.81);Difficulty in walking, not elsewhere classified (R26.2);Pain Pain - Right/Left:  (buttocks from wounds) Pain - part of body:  (buttocks from wounds)    Time: 0141-0301 PT Time Calculation (min) (ACUTE ONLY): 45 min   Charges:   PT Evaluation $PT Eval Moderate Complexity:  1 Mod PT Treatments $Therapeutic Activity: 23-37 mins       Corinna Capra, PT, DPT  Acute Rehabilitation Ortho Tech Supervisor [phone] pager [phone]) [phone] office

## 2021-05-30 NOTE — Progress Notes (Addendum)
Subjective/Chief Complaint: Afebrile,  WBC 9.6 Tolerating CLD but had increased pain and loose stools after attempting FLD yesterday, so backed off to CLD. Denies fever, chills, nausea, vomiting, or abd distention. Had 3 loose BMs yesterday.  Objective: Vital signs in last 24 hours: Temp:  [97.4 F (36.3 C)-98.8 F (37.1 C)] 97.6 F (36.4 C) (06/14 0317) Pulse Rate:  [70-96] 92 (06/14 0317) Resp:  [14-20] 18 (06/14 0317) BP: (101-140)/(63-91) 109/84 (06/14 0317) SpO2:  [93 %-99 %] 99 % (06/14 0317) Last BM Date: 05/28/21  Intake/Output from previous day: 06/13 0701 - 06/14 0700 In: 398.2 [I.V.:266.2; IV Piggyback:132.1] Out: -  Intake/Output this shift: No intake/output data recorded.  PE:   Constitutional: No acute distress, conversant, appears states age. Eyes: Anicteric sclerae, moist conjunctiva, no lid lag Lungs: Clear to auscultation bilaterally, normal respiratory effort CV: regular rate and rhythm, no murmurs, mild peripheral edema GI: Soft, obese, no masses or hepatosplenomegaly, non-tender to palpation Skin: No rashes, palpation reveals normal turgor Psychiatric: appropriate judgment and insight, oriented to person, place, and time  Lab Results:  Recent Labs    05/29/21 0027 05/30/21 0208  WBC 9.1 9.6  HGB 10.6* 11.0*  HCT 33.3* 33.8*  PLT 88* 111*   BMET Recent Labs    05/29/21 1631 05/30/21 0208  NA 136 135  K 3.5 3.4*  CL 102 103  CO2 23 24  GLUCOSE 195* 230*  BUN 13 16  CREATININE 1.02* 0.96  CALCIUM 8.0* 8.0*   PT/INR No results for input(s): LABPROT, INR in the last 72 hours. ABG No results for input(s): PHART, HCO3 in the last 72 hours.  Invalid input(s): PCO2, PO2  Studies/Results: CT IMAGE GUIDED DRAINAGE BY PERCUTANEOUS CATHETER  Result Date: 05/28/2021 INDICATION: 70 year old female presents for possible drainage of lower abdominal/pelvic abscess EXAM: CT-GUIDED DRAINAGE OF PELVIC ABSCESS MODIFIED EXAM DISCONTINUATION FOR  INABILITY TO PERFORM ABSCESS DRAINAGE MEDICATIONS: None ANESTHESIA/SEDATION: None COMPLICATIONS: None TECHNIQUE: Informed written consent was obtained from the patient after a thorough discussion of the procedural risks, benefits and alternatives. All questions were addressed. Maximal Sterile Barrier Technique was utilized including caps, mask, sterile gowns, sterile gloves, sterile drape, hand hygiene and skin antiseptic. A timeout was performed prior to the initiation of the procedure. PROCEDURE: Patient was positioned supine position on the CT gantry table. Scout CT was performed for planning purposes. After careful review of the images, we withdrew from the procedure, as there is no safe window for access into the fluid collection at this time. FINDINGS: Fluid collection persists just superior to the urinary bladder and the uterus, with small bowel surrounding fluid and gas collection in the central mesentery of the low abdomen. There is no safe window for percutaneous needle puncture. We withdrew from the attempt. IMPRESSION: Attempted CT-guided drainage of abdominal/pelvic abscess identifying no safe window for attempt at this time. Exam was discontinued. Signed, Yvone Neu. Reyne Dumas, RPVI [facility] Electronically Signed   By: Gilmer Mor D.O.   On: 05/28/2021 12:12    Anti-infectives: Anti-infectives (From admission, onward)    Start     Dose/Rate Route Frequency Ordered Stop   05/27/21 0500  piperacillin-tazobactam (ZOSYN) IVPB 3.375 g        3.375 g 12.5 mL/hr over 240 Minutes Intravenous Every 8 hours 05/26/21 2126     05/26/21 2015  ceFEPIme (MAXIPIME) 2 g in sodium chloride 0.9 % 100 mL IVPB       See Hyperspace for  full Linked Orders Report.   2 g 200 mL/hr over 30 Minutes Intravenous  Once 05/26/21 2004 05/26/21 2107   05/26/21 2015  metroNIDAZOLE (FLAGYL) IVPB 500 mg       See Hyperspace for full Linked Orders Report.   500  mg 100 mL/hr over 60 Minutes Intravenous  Once 05/26/21 2004 05/26/21 2135       Assessment/Plan: 28F Diverticulitis with intra-abdominal abscess (9x5 cm) -AFVSS, WBC 9.6 - IR without a safe window for abscess drainage 6/12 -non-tender on exam but had increased abdominal pain with FLD. Continue clears - continue IV abx.  - will likely need a repeat CT scan Thursday or Friday for follow up of intraabd abscess. I do have concerns that, given the size of the abscess, this may not fully resolve without IR drainage or surgery. No emergent surgical needs today.   - OOB/mobilize with therapies   FEN: CLD XM:IWOEH VTE: SCD's, hep gtt Foley: none, external cath Dispo: SDU   - Below per primary service - Bilateral PE-heparin IV  Acute respiratory failure with hypoxia Thombocytopenia Chronic diastolic heart failure CKD IIIa HTN DM2 Morbid obesity    LOS: 4 days    Adam Phenix 05/30/2021

## 2021-05-30 NOTE — Progress Notes (Signed)
PROGRESS NOTE    [name]  NID:[id number] DOB: 12-25-1950 DOA: 05/26/2021 PCP: Ollen Bowl, MD   Brief Narrative: [name] is a 70 y.o. female with a history of diastolic heart failure, hypertension, sleep apnea. Patient presented secondary to abdominal pain and found to have evidence of diverticulitis with perforation and abscess; general surgery consulted and patient started on Zosyn IV. Patient found incidentally to have a large PE with features of right heart strain, started on Heparin IV.   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 General surgery consulted and recommend IR consult for drain placement. Started empirically on Zosyn IV on admission. Afebrile. No leukocytosis. IR unable to find safe window for drain placement. General surgery recommending no surgery at this time. Patient is not tolerating advancement in diet.   -Continue Zosyn IV -ID recommendations: Continue Zosyn  Bilateral pulmonary embolism No provoking factor identified. Slightly hypotensive on admission which resolved with IV fluids. Started on Heparin IV. CT suggests evidence of right heart strain. Heart ultrasound confirms evidence of right heart strain. Overall, vitals are not concerning. Discussed with PCCM who recommended to continue anticoagulation with recommendation for outpatient pulmonology follow-up with Dr. Judeth Horn. -Continue Heparin IV. Switch to oral anticoagulation once surgical management plans have been solidified -PT eval  Acute respiratory failure with hypoxia Secondary to PE. -Continue oxygen  therapy -Wean to room air as able  Thrombocytopenia In setting of DVT and infection. Stable.  Chronic diastolic heart failure Stable.  CKD stage IIIa Chronic issue. Stable.  Primary hypertension Patient is on amlodipine, Coreg as an outpatient. Antihypertensives held secondary to hypotension on admission. Blood pressure controlled currently. No tachycardia noted. -Continue to hold Coreg/Amlodipine  Hypokalemia Hypomagnesemia Given repletion. Improved -Continue to replete as needed  Diabetes mellitus, type 2 -Continue SSI  Hypothyroidism Grave's disease with exophthalmos -Continue Synthroid -Continue home prednisone at planned reduced dose of 20 mg daily (patient previously taking 40 mg daily)  Morbid obesity Body mass index is 43.99 kg/m.   DVT prophylaxis: Heparin IV Code Status:   Code Status: Full Code Family Communication: Daughter at bedside Disposition Plan: Discharge home vs SNF pending management of diverticulitis/abscess   Consultants:  General surgery Interventional radiology Infectious disease  Procedures:  None  Antimicrobials: Zosyn IV    Subjective: Worsening abdominal pain with full liquid diet.   Objective: Vitals:   05/29/21 2003 05/29/21 2317 05/30/21 0317 05/30/21 0832  BP: 125/77 116/76 109/84 117/86  Pulse: 87 86 92 93  Resp: 20 15 18 16   Temp: 98.5 F (36.9 C) (!) 97.4 F (36.3 C) 97.6 F (36.4 C) 98.5 F (36.9 C)  TempSrc: Oral Oral Oral Oral  SpO2: 96% 99% 99% 99%  Weight:      Height:        Intake/Output Summary (Last 24 hours) at 05/30/2021 1008 Last data filed at 05/30/2021 0500 Gross per 24 hour  Intake 398.23 ml  Output --  Net 398.23 ml    Filed Weights   05/27/21 0041  Weight: 109.1 kg    Examination:  General exam: Appears calm and comfortable and in no acute distress. Conversant Respiratory: Clear to  auscultation. Respiratory effort normal with no intercostal retractions or use of accessory  muscles Cardiovascular: S1 & S2 heard, RRR. No murmurs, rubs, gallops or clicks. 1-2+ LE edema Gastrointestinal: Abdomen is non-distended, soft and mildly tender in lower quadrants. No masses felt. Normal bowel sounds heard Neurologic: No focal neurological deficits Musculoskeletal: No calf tenderness Skin: No cyanosis. No new rashes Psychiatry: Alert and oriented. Memory intact. Mood & affect appropriate   Data Reviewed: I have personally reviewed following labs and imaging studies  CBC Lab Results  Component Value Date   WBC 9.6 05/30/2021   RBC 3.52 (L) 05/30/2021   HGB 11.0 (L) 05/30/2021   HCT 33.8 (L) 05/30/2021   MCV 96.0 05/30/2021   MCH 31.3 05/30/2021   PLT 111 (L) 05/30/2021   MCHC 32.5 05/30/2021   RDW 15.4 05/30/2021   LYMPHSABS 0.4 (L) 05/26/2021   MONOABS 0.2 05/26/2021   EOSABS 0.1 05/26/2021   BASOSABS 0.0 05/26/2021     Last metabolic panel Lab Results  Component Value Date   NA 135 05/30/2021   K 3.4 (L) 05/30/2021   CL 103 05/30/2021   CO2 24 05/30/2021   BUN 16 05/30/2021   CREATININE 0.96 05/30/2021   GLUCOSE 230 (H) 05/30/2021   GFRNONAA >60 05/30/2021   GFRAA 61 11/07/2020   CALCIUM 8.0 (L) 05/30/2021   PROT 4.4 (L) 05/27/2021   ALBUMIN 2.2 (L) 05/27/2021   BILITOT 1.0 05/27/2021   ALKPHOS 52 05/27/2021   AST 13 (L) 05/27/2021   ALT 23 05/27/2021   ANIONGAP 8 05/30/2021    CBG (last 3)  Recent Labs    05/29/21 2319 05/30/21 0319 05/30/21 0756  GLUCAP 228* 219* 169*      GFR: Estimated Creatinine Clearance: 63.4 mL/min (by C-G formula based on SCr of 0.96 mg/dL).  Coagulation Profile: No results for input(s): INR, PROTIME in the last 168 hours.  Recent Results (from the past 240 hour(s))  Urine culture     Status: Abnormal   Collection Time: 05/23/21  7:44 PM   Specimen: Urine, Random  Result Value Ref Range Status   Specimen Description   Final    URINE, RANDOM Performed at [facility], [address], [place], [place] [address]    Special Requests   Final    NONE Performed at [facility], [address], [place], [place] [address]    Culture MULTIPLE SPECIES PRESENT, SUGGEST RECOLLECTION (A)  Final   Report Status 05/25/2021 FINAL  Final  Resp Panel by RT-PCR (Flu A&B, Covid) Nasopharyngeal Swab     Status: None   Collection Time: 05/26/21  5:19 PM   Specimen: Nasopharyngeal Swab; Nasopharyngeal(NP) swabs in vial transport medium  Result Value Ref Range Status   SARS Coronavirus 2 by RT PCR NEGATIVE NEGATIVE Final    Comment: (NOTE) SARS-CoV-2 target nucleic acids are NOT DETECTED.  The SARS-CoV-2 RNA is generally detectable in upper respiratory specimens during the acute phase of infection. The lowest concentration of SARS-CoV-2 viral copies this assay can detect is 138 copies/mL. A negative result does not preclude SARS-Cov-2 infection and should not be used as the sole basis for treatment or other patient management decisions. A negative result may occur with  improper specimen collection/handling, submission of specimen other than nasopharyngeal swab, presence of viral mutation(s) within the areas targeted by this assay, and inadequate number of viral copies(<138 copies/mL). A negative result must be combined with clinical observations, patient history, and epidemiological information. The  expected result is Negative.  Fact Sheet for Patients:  BloggerCourse.com  Fact Sheet for Healthcare Providers:  SeriousBroker.it  This test is no t yet approved or cleared by the [place] FDA and  has been authorized for detection and/or diagnosis of SARS-CoV-2 by FDA under an Emergency Use Authorization (EUA). This EUA will remain  in effect (meaning this test can be used) for the duration of the COVID-19 declaration under Section 564(b)(1) of the Act, 21 U.S.C.section 360bbb-3(b)(1), unless the  authorization is terminated  or revoked sooner.       Influenza A by PCR NEGATIVE NEGATIVE Final   Influenza B by PCR NEGATIVE NEGATIVE Final    Comment: (NOTE) The Xpert Xpress SARS-CoV-2/FLU/RSV plus assay is intended as an aid in the diagnosis of influenza from Nasopharyngeal swab specimens and should not be used as a sole basis for treatment. Nasal washings and aspirates are unacceptable for Xpert Xpress SARS-CoV-2/FLU/RSV testing.  Fact Sheet for Patients: BloggerCourse.com  Fact Sheet for Healthcare Providers: SeriousBroker.it  This test is not yet approved or cleared by the [place] FDA and has been authorized for detection and/or diagnosis of SARS-CoV-2 by FDA under an Emergency Use Authorization (EUA). This EUA will remain in effect (meaning this test can be used) for the duration of the COVID-19 declaration under Section 564(b)(1) of the Act, 21 U.S.C. section 360bbb-3(b)(1), unless the authorization is terminated or revoked.  Performed at [facility], [address] [place], [place] [address]   Surgical PCR screen     Status: None   Collection Time: 05/27/21  1:29 AM   Specimen: Nasal Mucosa; Nasal Swab  Result Value Ref Range Status   MRSA, PCR NEGATIVE NEGATIVE Final   Staphylococcus aureus NEGATIVE NEGATIVE Final    Comment: (NOTE) The Xpert SA Assay (FDA approved for NASAL specimens in patients 26 years of age and older), is one component of a comprehensive surveillance program. It is not intended to diagnose infection nor to guide or monitor treatment. Performed at [facility], [address] [place], [place] [address]          Radiology Studies: CT IMAGE GUIDED DRAINAGE BY PERCUTANEOUS CATHETER  Result Date: 05/28/2021 INDICATION: 70 year old female presents for possible drainage of lower abdominal/pelvic abscess EXAM: CT-GUIDED DRAINAGE OF PELVIC ABSCESS MODIFIED EXAM  DISCONTINUATION FOR INABILITY TO PERFORM ABSCESS DRAINAGE MEDICATIONS: None ANESTHESIA/SEDATION: None COMPLICATIONS: None TECHNIQUE: Informed written consent was obtained from the patient after a thorough discussion of the procedural risks, benefits and alternatives. All questions were addressed. Maximal Sterile Barrier Technique was utilized including caps, mask, sterile gowns, sterile gloves, sterile drape, hand hygiene and skin antiseptic. A timeout was performed prior to the initiation of the procedure. PROCEDURE: Patient was positioned supine position on the CT gantry table. Scout CT was performed for planning purposes. After careful review of the images, we withdrew from the procedure, as there is no safe window for access into the fluid collection at this time. FINDINGS: Fluid collection persists just superior to the urinary bladder and the uterus, with small bowel surrounding fluid and gas collection in the central mesentery of the low abdomen. There is no safe window for percutaneous needle puncture. We withdrew from the attempt. IMPRESSION: Attempted CT-guided drainage of abdominal/pelvic abscess identifying no safe window for attempt at this time. Exam was discontinued. Signed, Yvone Neu. Reyne Dumas, RPVI [facility] Electronically Signed   By: Gilmer Mor D.O.   On: 05/28/2021  12:12        Scheduled Meds:  feeding supplement  237 mL Oral BID BM   insulin aspart  0-15 Units Subcutaneous TID WC   levothyroxine  112 mcg Oral Once per day on Mon Tue Wed Thu Fri Sat   And   [START ON 06/04/2021] levothyroxine  56 mcg Oral Once per day on Sun   mupirocin ointment  1 application Nasal BID   pantoprazole  40 mg Oral Daily   predniSONE  20 mg Oral Q breakfast   rosuvastatin  5 mg Oral Daily   Continuous Infusions:  heparin 1,100 Units/hr (05/30/21 0628)   piperacillin-tazobactam (ZOSYN)  IV 3.375 g (05/30/21 0457)     LOS: 4 days      Jacquelin Hawkingalph Chael Urenda, MD [facility] 05/30/2021, 10:08 AM  If 7PM-7AM, please contact night-coverage [email or web address]

## 2021-05-30 NOTE — Consult Note (Signed)
WOC Nurse Consult Note: Patient receiving care in [facility] 4NP05 Reason for Consult: Frequent stools, moisture associated breakdown.  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  Pressure Injury POA: NA Dressing procedure/placement/frequency: Clean the groin and buttock area with soap and water, dry thoroughly and apply Desitin to all MASD areas PRN soiling.  Monitor the wound area(s) for worsening of condition such as: Signs/symptoms of infection, increase in size, development of or worsening of odor, development of pain, or increased pain at the affected locations.   Notify the medical team if any of these develop.  Thank you for the consult. WOC nurse will not follow at this time.   Please re-consult the WOC team if needed.  Renaldo Reel Katrinka Blazing, MSN, RN, CMSRN, Angus Seller, [facility] Wound Treatment Associate Pager [phone]

## 2021-05-30 NOTE — Progress Notes (Signed)
Patient had large liquid dark, thick, tarry stool.  Some blood clots noted as well. Patient on heparin drip.  Dr. Caleb Popp notified and orders for stool occult blood ordered.  H&H also ordered.  Will monitor.

## 2021-05-30 NOTE — Progress Notes (Signed)
[facility]  Date of Admission:  05/26/2021           Reason for visit: Follow up on intra-abdominal abscess  Current antibiotics: Pip tazo 6/10-- present   Previous antibiotics: Cefepime 6/10 x1 MTZ 6/10 x1  ASSESSMENT:    Intra-abdominal abscess: Secondary to perforated diverticulitis and initially measuring 9.2 x 5.8 cm.  Not amenable to IR drainage at this time.  Surgery evaluating and planning for likely repeat CT scan late this week for follow-up as there were concerns that this will not fully resolve without some sort of drainage or surgery given the size of her abscess. Bilateral PE: Incidentally noted and currently on heparin drip. CKD: Creatinine clearance today is 63.4 with a creatinine of 0.96. Diabetes: A1c is 9.4. Morbid obesity: Body mass index is 43.99 kg/m.  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 feeding supplement  237 mL Oral BID BM  . insulin aspart  0-15 Units Subcutaneous TID WC  . levothyroxine  112 mcg Oral Once per day on Mon Tue Wed Thu Fri Sat   And  . [START ON 06/04/2021] levothyroxine  56 mcg Oral Once per day on Sun  . mupirocin ointment  1 application Nasal BID  . pantoprazole  40 mg Oral Daily  . predniSONE  20 mg Oral Q breakfast  . rosuvastatin  5 mg Oral Daily    Continuous Infusions: . heparin 1,100 Units/hr (05/30/21 0628)  . piperacillin-tazobactam (ZOSYN)  IV 3.375 g (05/30/21 0457)   PRN Meds:.liver oil-zinc oxide, morphine injection  SUBJECTIVE:   24 hour events:  No acute events noted overnight Developed some abdominal pain with full liquid diet and has been transition back to clears. Afebrile WBC 9.6 No new imaging No new cultures  No new complaints this morning, tolerating Zosyn without nausea, vomiting, diarrhea at this time.  No fevers.  Discussed with daughter regarding repeat imaging per surgery conversation they had earlier today.  Review of Systems  All other systems reviewed and are negative.    OBJECTIVE:   Blood pressure 117/86, pulse 93, temperature 98.5 F (36.9 C), temperature source Oral, resp. rate 16, height 5\' 2"  (1.575 m), weight 109.1 kg, SpO2 99 %. Body mass index is 43.99 kg/m.  Physical Exam Constitutional:      General: She is not in acute distress.    Appearance: Normal appearance.  HENT:     Head: Normocephalic and atraumatic.  Eyes:     Extraocular Movements: Extraocular movements intact.     Conjunctiva/sclera: Conjunctivae normal.  Pulmonary:     Effort: Pulmonary effort is normal. No respiratory distress.     Comments: On nasal cannula Abdominal:     Palpations: Abdomen is soft.  Skin:    General: Skin is warm and dry.     Findings: No rash.  Neurological:  General: No focal deficit present.     Mental Status: She is alert and oriented to person, place, and time.  Psychiatric:        Mood and Affect: Mood normal.        Behavior: Behavior normal.     Lab Results: Lab Results  Component Value Date   WBC 9.6 05/30/2021   HGB 11.0 (L) 05/30/2021   HCT 33.8 (L) 05/30/2021   MCV 96.0 05/30/2021   PLT 111 (L) 05/30/2021    Lab Results  Component Value Date   NA 135 05/30/2021   K 3.4 (L) 05/30/2021   CO2 24 05/30/2021   GLUCOSE 230 (H) 05/30/2021   BUN 16 05/30/2021    CREATININE 0.96 05/30/2021   CALCIUM 8.0 (L) 05/30/2021   GFRNONAA >60 05/30/2021   GFRAA 61 11/07/2020    Lab Results  Component Value Date   ALT 23 05/27/2021   AST 13 (L) 05/27/2021   ALKPHOS 52 05/27/2021   BILITOT 1.0 05/27/2021    No results found for: CRP  No results found for: ESRSEDRATE   I have reviewed the micro and lab results in Epic.  Imaging: CT IMAGE GUIDED DRAINAGE BY PERCUTANEOUS CATHETER  Result Date: 05/28/2021 INDICATION: 70 year old female presents for possible drainage of lower abdominal/pelvic abscess EXAM: CT-GUIDED DRAINAGE OF PELVIC ABSCESS MODIFIED EXAM DISCONTINUATION FOR INABILITY TO PERFORM ABSCESS DRAINAGE MEDICATIONS: None ANESTHESIA/SEDATION: None COMPLICATIONS: None TECHNIQUE: Informed written consent was obtained from the patient after a thorough discussion of the procedural risks, benefits and alternatives. All questions were addressed. Maximal Sterile Barrier Technique was utilized including caps, mask, sterile gowns, sterile gloves, sterile drape, hand hygiene and skin antiseptic. A timeout was performed prior to the initiation of the procedure. PROCEDURE: Patient was positioned supine position on the CT gantry table. Scout CT was performed for planning purposes. After careful review of the images, we withdrew from the procedure, as there is no safe window for access into the fluid collection at this time. FINDINGS: Fluid collection persists just superior to the urinary bladder and the uterus, with small bowel surrounding fluid and gas collection in the central mesentery of the low abdomen. There is no safe window for percutaneous needle puncture. We withdrew from the attempt. IMPRESSION: Attempted CT-guided drainage of abdominal/pelvic abscess identifying no safe window for attempt at this time. Exam was discontinued. Signed, Yvone Neu. Reyne Dumas, RPVI [facility] Radiology Electronically Signed   By: Gilmer Mor D.O.   On: 05/28/2021 12:12     Imaging independently reviewed in Epic.    Vedia Coffer for Infectious Disease [facility] [phone] pager 05/30/2021, 10:51 AM

## 2021-05-30 NOTE — Progress Notes (Signed)
ANTICOAGULATION CONSULT NOTE - Follow Up Consult  Pharmacy Consult for Heparin Indication: pulmonary embolus  Allergies  Allergen Reactions   Gluten Meal     Celiac    Patient Measurements: Height: 5\' 2"  (157.5 cm) Weight: 109.1 kg (240 lb 8.4 oz) IBW/kg (Calculated) : 50.1 Heparin Dosing Weight:  76.6 kg  Vital Signs: Temp: 98.5 F (36.9 C) (06/14 0832) Temp Source: Oral (06/14 0832) BP: 117/86 (06/14 0832) Pulse Rate: 93 (06/14 0832)  Labs: Recent Labs    05/28/21 0031 05/28/21 0945 05/28/21 1028 05/28/21 2053 05/29/21 0027 05/29/21 1631 05/30/21 0208  HGB 10.9*  --   --   --  10.6*  --  11.0*  HCT 33.7*  --   --   --  33.3*  --  33.8*  PLT 84*  --   --   --  88*  --  111*  HEPARINUNFRC 1.02*  --    < > 0.39 0.39  --  0.34  CREATININE  --  1.23*  --   --   --  1.02* 0.96   < > = values in this interval not displayed.     Estimated Creatinine Clearance: 63.4 mL/min (by C-G formula based on SCr of 0.96 mg/dL).   Assessment:  Anticoag: IV heparin for acute BL PE - Hep level 0.34 in goal. Hgb 11. Plts 111 up.  Goal of Therapy:  Heparin level 0.3-0.7 units/ml Monitor platelets by anticoagulation protocol: Yes   Plan:  Heparin drip at 1100 units/hr Monitor daily HL, CBC and s/s of bleeding  Zosyn 3.375g IV q8hr.   Bruce Churilla S. 06/01/21, PharmD, BCPS Clinical Staff Pharmacist [email or web address] Merilynn Finland Stillinger 05/30/2021,12:13 PM

## 2021-05-30 NOTE — Progress Notes (Signed)
Call from nurse about patient having black tarry stool. Complicated by heparin IV in submassive PE as patient had evidence of right heart strain. Hemoccult ordered but stool sample is no longer available. Stat H&H ordered. Discussed with GI on call in case situation gets concerning overnight, however expect AM GI consult assuming patient is stable tonight. Will increase to Protonix 40 mg BID. Will obtain LE venous duplex to assess for possible DVT if situation suggests possible need for IVC filter.  Jacquelin Hawking, MD [facility] 05/30/2021, 6:32 PM

## 2021-05-30 NOTE — Progress Notes (Signed)
Inpatient Diabetes Program Recommendations  AACE/ADA: New Consensus Statement on Inpatient Glycemic Control (2015)  Target Ranges:  Prepandial:   less than 140 mg/dL      Peak postprandial:   less than 180 mg/dL (1-2 hours)      Critically ill patients:  140 - 180 mg/dL   Lab Results  Component Value Date   GLUCAP 236 (H) 05/30/2021   HGBA1C 9.4 (H) 04/22/2021    Review of Glycemic Control Results for CACHE, [name] (MRN [id number]) as of 05/30/2021 15:46  Ref. Range 05/29/2021 19:46 05/29/2021 23:19 05/30/2021 03:19 05/30/2021 07:56 05/30/2021 12:53 05/30/2021 15:19  Glucose-Capillary Latest Ref Range: 70 - 99 mg/dL 272 (H) 536 (H) 644 (H) 169 (H) 207 (H) 236 (H)   Diabetes history: DM 2 Outpatient Diabetes medications: Metformin 500 mg bid Current orders for Inpatient glycemic control:  Novolog moderate tid with meals Prednisone 20 mg daily  Inpatient Diabetes Program Recommendations:   If appropriate add Lantus 10 units daily.    Thanks,  Beryl Meager, RN, BC-ADM Inpatient Diabetes Coordinator Pager [phone] (8a-5p)

## 2021-05-31 ENCOUNTER — Inpatient Hospital Stay: Payer: Self-pay

## 2021-05-31 ENCOUNTER — Inpatient Hospital Stay (HOSPITAL_COMMUNITY): Payer: Medicare HMO

## 2021-05-31 ENCOUNTER — Encounter (HOSPITAL_COMMUNITY)
Admission: EM | Disposition: A | Discharge status: Skilled Nursing Facility | Payer: Self-pay | Source: Home / Self Care | Attending: Internal Medicine

## 2021-05-31 DIAGNOSIS — I2699 Other pulmonary embolism without acute cor pulmonale: Secondary | ICD-10-CM

## 2021-05-31 DIAGNOSIS — K921 Melena: Secondary | ICD-10-CM

## 2021-05-31 DIAGNOSIS — N189 Chronic kidney disease, unspecified: Secondary | ICD-10-CM

## 2021-05-31 LAB — BASIC METABOLIC PANEL
Anion gap: 9 (ref 5–15)
BUN: 25 mg/dL — ABNORMAL HIGH (ref 8–23)
CO2: 23 mmol/L (ref 22–32)
Calcium: 7.9 mg/dL — ABNORMAL LOW (ref 8.9–10.3)
Chloride: 103 mmol/L (ref 98–111)
Creatinine, Ser: 0.86 mg/dL (ref 0.44–1.00)
GFR, Estimated: >60 mL/min (ref >60)
Glucose, Bld: 310 mg/dL — ABNORMAL HIGH (ref 70–99)
Potassium: 5.1 mmol/L (ref 3.5–5.1)
Sodium: 135 mmol/L (ref 135–145)

## 2021-05-31 LAB — CBC
HCT: 27.9 % — ABNORMAL LOW (ref 36.0–46.0)
Hemoglobin: 9.1 g/dL — ABNORMAL LOW (ref 12.0–15.0)
MCH: 31.3 pg (ref 26.0–34.0)
MCHC: 32.6 g/dL (ref 30.0–36.0)
MCV: 95.9 fL (ref 80.0–100.0)
Platelets: 152 10*3/uL (ref 150–400)
RBC: 2.91 MIL/uL — ABNORMAL LOW (ref 3.87–5.11)
RDW: 15.3 % (ref 11.5–15.5)
WBC: 10.7 10*3/uL — ABNORMAL HIGH (ref 4.0–10.5)
nRBC: 0 % (ref 0.0–0.2)

## 2021-05-31 LAB — GLUCOSE, CAPILLARY
Glucose-Capillary: 309 mg/dL — ABNORMAL HIGH (ref 70–99)
Glucose-Capillary: 211 mg/dL — ABNORMAL HIGH (ref 70–99)
Glucose-Capillary: 135 mg/dL — ABNORMAL HIGH (ref 70–99)
Glucose-Capillary: 144 mg/dL — ABNORMAL HIGH (ref 70–99)
Glucose-Capillary: 86 mg/dL (ref 70–99)
Glucose-Capillary: 92 mg/dL (ref 70–99)

## 2021-05-31 LAB — GASTROINTESTINAL PANEL BY PCR, STOOL (REPLACES STOOL CULTURE)

## 2021-05-31 LAB — HEPARIN LEVEL (UNFRACTIONATED): Heparin Unfractionated: 0.29 IU/mL — ABNORMAL LOW (ref 0.30–0.70)

## 2021-05-31 LAB — HEMOGLOBIN AND HEMATOCRIT, BLOOD
HCT: 26.1 % — ABNORMAL LOW (ref 36.0–46.0)
HCT: 24.1 % — ABNORMAL LOW (ref 36.0–46.0)
Hemoglobin: 8.3 g/dL — ABNORMAL LOW (ref 12.0–15.0)
Hemoglobin: 8 g/dL — ABNORMAL LOW (ref 12.0–15.0)

## 2021-05-31 SURGERY — EGD (ESOPHAGOGASTRODUODENOSCOPY)
Anesthesia: Monitor Anesthesia Care

## 2021-05-31 MED ORDER — PREDNISONE 5 MG PO TABS: 10.0000 mg | ORAL_TABLET | Freq: Every day | ORAL | Status: DC

## 2021-05-31 MED ORDER — PREDNISONE 5 MG PO TABS: 30.0000 mg | ORAL_TABLET | Freq: Every day | ORAL | Status: DC

## 2021-05-31 MED ORDER — CALCIUM GLUCONATE-NACL 2-0.675 GM/100ML-% IV SOLN
2.0000 g | Freq: Once | INTRAVENOUS | Status: AC
  Administered 2021-05-31: 2000 mg via INTRAVENOUS
  Filled 2021-05-31: qty 100

## 2021-05-31 MED ORDER — INSULIN GLARGINE 100 UNIT/ML ~~LOC~~ SOLN
15.0000 [IU] | Freq: Every day | SUBCUTANEOUS | Status: DC
  Administered 2021-05-31: 15 [IU] via SUBCUTANEOUS
  Filled 2021-05-31 (×2): qty 0.15

## 2021-05-31 MED ORDER — PREDNISONE 20 MG PO TABS: 20.0000 mg | ORAL_TABLET | Freq: Every day | ORAL | Status: DC

## 2021-05-31 MED ORDER — ONDANSETRON HCL 4 MG/2ML IJ SOLN
4.0000 mg | Freq: Four times a day (QID) | INTRAMUSCULAR | Status: DC | PRN
  Administered 2021-05-31 – 2021-06-13 (×3): 4 mg via INTRAVENOUS
  Filled 2021-05-31 (×3): qty 2

## 2021-05-31 MED ORDER — INSULIN ASPART 100 UNIT/ML IJ SOLN
0.0000 [IU] | INTRAMUSCULAR | Status: DC
  Administered 2021-05-31: 2 [IU] via SUBCUTANEOUS
  Administered 2021-05-31: 5 [IU] via SUBCUTANEOUS

## 2021-05-31 NOTE — Progress Notes (Addendum)
PROGRESS NOTE    [name]  WNU:[id number] DOB: [birth year] DOA: 05/26/2021 PCP: Ollen Bowl, MD   Brief Narrative: [name] is a 70 y.o. female with a history of diastolic heart failure, hypertension, sleep apnea. Patient presented secondary to abdominal pain and found to have evidence of diverticulitis with perforation and abscess; general surgery consulted and patient started on Zosyn IV. Patient found incidentally to have a large PE with features of right heart strain, started on Heparin IV.   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 General surgery consulted and recommend IR consult for drain placement. Started empirically on Zosyn IV on admission. Afebrile. No leukocytosis. IR unable to find safe window for drain placement. General surgery recommending no surgery at this time. Patient is not tolerating advancement in diet.   -ID recommendations: Continue Zosyn  Bilateral pulmonary embolism No provoking factor identified. Slightly hypotensive on admission which resolved with IV fluids. Started on Heparin IV. CT suggests evidence of right heart strain. Heart ultrasound confirms evidence of right heart strain. Overall, vitals are not concerning. Discussed with PCCM who recommended to continue anticoagulation with recommendation for outpatient pulmonology follow-up with Dr. Judeth Horn. -Addendum: Holding Heparin IV. Switch to oral anticoagulation once surgical management plans have been solidified -PT eval -LE venous duplex in setting of GI bleed and possible need for IVC  filter  Melena Likely upper GI bleed Complicated by heparin use for acute PE. GI consulted -GI recommendations pending -NPO pending possible EGD -H&H q8 hours, CBC daily  Addendum: hgb down to 8.3 down from 9.1 earlier this morning and 10.6 last night with continued melenotic stool. Will need to hold heparin drip at this time. Discussed with Dr. Loreta Ave who will evaluate patient as soon as she can this afternoon.  Acute respiratory failure with hypoxia Secondary to PE. -Continue oxygen therapy -Wean to room air as able  Thrombocytopenia In setting of DVT and infection. Stable.  Chronic diastolic heart failure Stable.  CKD stage IIIa Chronic issue. Stable.  Primary hypertension Patient is on amlodipine, Coreg as an outpatient. Antihypertensives held secondary to hypotension on admission. Blood pressure controlled currently. No tachycardia noted. -Continue to hold Coreg/Amlodipine  Hypokalemia Hypomagnesemia Given repletion. Improved -Continue to replete as needed  Diabetes mellitus, type 2 Uncontrolled. Complicated by prednisone. Currently NPO -Lantus 15 units daily -SSI q4 hours while NPO; switch once started back on oral intake  Hypothyroidism Grave's disease with exophthalmos -Continue Synthroid -Prednisone taper (starting 6/15): Prednisone 30 mg daily x5 days Prednisone 20 mg daily x5 days Prednisone 10 mg daily x5 days, then stop  Morbid obesity Body mass index is 43.99 kg/m.   DVT prophylaxis: Heparin IV Code Status:   Code Status: Full Code Family Communication: Daughter at bedside and daughter on telephone Disposition Plan: Discharge home vs SNF pending management of diverticulitis/abscess   Consultants:  General surgery Interventional radiology Infectious disease Gastroenterology  Procedures:  None  Antimicrobials: Zosyn IV    Subjective: Not feeling well today. Abdominal pain. Nausea. Poor appetite. Continued bowel movements.    Objective: Vitals:   05/30/21 2302 05/31/21 0000 05/31/21 0417 05/31/21 0754  BP: (!) 144/82  118/87 116/88  Pulse: 92 91 91 92  Resp: 20 14 14 17   Temp: 97.9 F (36.6 C)  98.2 F (36.8 C) 98.9 F (37.2 C)  TempSrc: Oral  Oral Axillary  SpO2: 97% 98% 98% 100%  Weight:      Height:        Intake/Output Summary (Last 24 hours) at 05/31/2021 0859 Last data filed at 05/31/2021 0701 Gross per 24 hour  Intake 966.25 ml  Output 125 ml  Net 841.25 ml    Filed Weights   05/27/21 0041  Weight: 109.1 kg    Examination:  General exam: Appears calm and comfortable and in no acute distress. Conversant Respiratory: Clear to auscultation. Respiratory effort normal with no intercostal retractions or use of accessory muscles Cardiovascular: S1 & S2 heard, RRR. No murmurs, rubs, gallops or clicks. 1-2+ BLE edema Gastrointestinal: Abdomen is nondistended, soft and mildly tender diffusely. No masses felt. Decreased bowel sounds heard Neurologic: No focal neurological deficits Musculoskeletal: No calf tenderness Skin: No cyanosis. No new rashes Psychiatry: Alert and oriented. Memory intact. Depressed mood. Blunt affect.   Data Reviewed: I have personally reviewed following labs and imaging studies  CBC Lab Results  Component Value Date   WBC 10.7 (H) 05/31/2021   RBC 2.91 (L) 05/31/2021   HGB 9.1 (L) 05/31/2021   HCT 27.9 (L) 05/31/2021   MCV 95.9 05/31/2021   MCH 31.3 05/31/2021   PLT 152 05/31/2021   MCHC 32.6 05/31/2021   RDW 15.3 05/31/2021   LYMPHSABS 0.4 (L) 05/26/2021   MONOABS 0.2 05/26/2021   EOSABS 0.1 05/26/2021   BASOSABS 0.0 05/26/2021     Last metabolic panel Lab Results  Component Value Date   NA 135 05/31/2021   K 5.1 05/31/2021   CL 103 05/31/2021   CO2 23 05/31/2021   BUN 25 (H) 05/31/2021   CREATININE 0.86 05/31/2021   GLUCOSE 310 (H) 05/31/2021   GFRNONAA >60 05/31/2021   GFRAA 61 11/07/2020   CALCIUM 7.9 (L) 05/31/2021   PROT 4.4 (L)  05/27/2021   ALBUMIN 2.2 (L) 05/27/2021   BILITOT 1.0 05/27/2021   ALKPHOS 52 05/27/2021   AST 13 (L) 05/27/2021   ALT 23 05/27/2021   ANIONGAP 9 05/31/2021    CBG (last 3)  Recent Labs    05/30/21 2021 05/30/21 2307 05/31/21 0752  GLUCAP 329* 302* 309*      GFR: Estimated Creatinine Clearance: 70.8 mL/min (by C-G formula based on SCr of 0.86 mg/dL).  Coagulation Profile: No results for input(s): INR, PROTIME in the last 168 hours.  Recent Results (from the past 240 hour(s))  Urine culture     Status: Abnormal   Collection Time: 05/23/21  7:44 PM   Specimen: Urine, Random  Result Value Ref Range Status   Specimen Description   Final    URINE, RANDOM Performed at [facility], [address] [place], [place]    Special Requests   Final    NONE Performed at [facility], [address] [place], [place]    Culture MULTIPLE SPECIES PRESENT, SUGGEST RECOLLECTION (A)  Final   Report Status 05/25/2021 FINAL  Final  Resp Panel by RT-PCR (Flu A&B, Covid) Nasopharyngeal Swab     Status: None   Collection Time: 05/26/21  5:19 PM   Specimen: Nasopharyngeal Swab; Nasopharyngeal(NP) swabs in vial transport medium  Result Value Ref Range Status   SARS Coronavirus 2 by RT PCR NEGATIVE NEGATIVE Final  Comment: (NOTE) SARS-CoV-2 target nucleic acids are NOT DETECTED.  The SARS-CoV-2 RNA is generally detectable in upper respiratory specimens during the acute phase of infection. The lowest concentration of SARS-CoV-2 viral copies this assay can detect is 138 copies/mL. A negative result does not preclude SARS-Cov-2 infection and should not be used as the sole basis for treatment or other patient management decisions. A negative result may occur with  improper specimen collection/handling, submission of specimen other than nasopharyngeal swab, presence of viral mutation(s) within the areas targeted by this assay, and  inadequate number of viral copies(<138 copies/mL). A negative result must be combined with clinical observations, patient history, and epidemiological information. The expected result is Negative.  Fact Sheet for Patients:  BloggerCourse.comhttps://www.fda.gov/media/152166/download  Fact Sheet for Healthcare Providers:  SeriousBroker.ithttps://www.fda.gov/media/152162/download  This test is no t yet approved or cleared by the [place] FDA and  has been authorized for detection and/or diagnosis of SARS-CoV-2 by FDA under an Emergency Use Authorization (EUA). This EUA will remain  in effect (meaning this test can be used) for the duration of the COVID-19 declaration under Section 564(b)(1) of the Act, 21 U.S.C.section 360bbb-3(b)(1), unless the authorization is terminated  or revoked sooner.       Influenza A by PCR NEGATIVE NEGATIVE Final   Influenza B by PCR NEGATIVE NEGATIVE Final    Comment: (NOTE) The Xpert Xpress SARS-CoV-2/FLU/RSV plus assay is intended as an aid in the diagnosis of influenza from Nasopharyngeal swab specimens and should not be used as a sole basis for treatment. Nasal washings and aspirates are unacceptable for Xpert Xpress SARS-CoV-2/FLU/RSV testing.  Fact Sheet for Patients: BloggerCourse.comhttps://www.fda.gov/media/152166/download  Fact Sheet for Healthcare Providers: SeriousBroker.ithttps://www.fda.gov/media/152162/download  This test is not yet approved or cleared by the [place] FDA and has been authorized for detection and/or diagnosis of SARS-CoV-2 by FDA under an Emergency Use Authorization (EUA). This EUA will remain in effect (meaning this test can be used) for the duration of the COVID-19 declaration under Section 564(b)(1) of the Act, 21 U.S.C. section 360bbb-3(b)(1), unless the authorization is terminated or revoked.  Performed at [facility], [address] [place], [place] [address]   Surgical PCR screen     Status: None   Collection Time: 05/27/21  1:29 AM   Specimen: Nasal  Mucosa; Nasal Swab  Result Value Ref Range Status   MRSA, PCR NEGATIVE NEGATIVE Final   Staphylococcus aureus NEGATIVE NEGATIVE Final    Comment: (NOTE) The Xpert SA Assay (FDA approved for NASAL specimens in patients 71 years of age and older), is one component of a comprehensive surveillance program. It is not intended to diagnose infection nor to guide or monitor treatment. Performed at [facility], [address] [place], [place] [address]   C Difficile Quick Screen w PCR reflex     Status: None   Collection Time: 05/30/21  4:57 PM   Specimen: Per Rectum; Stool  Result Value Ref Range Status   C Diff antigen NEGATIVE NEGATIVE Final   C Diff toxin NEGATIVE NEGATIVE Final   C Diff interpretation No C. difficile detected.  Final    Comment: Performed at [facility], [address] [place], [place] [address]         Radiology Studies: No results found.      Scheduled Meds:  feeding supplement  237 mL Oral BID BM   insulin aspart  0-15 Units Subcutaneous TID WC   levothyroxine  112 mcg Oral Once per  day on Mon Tue Wed Thu Fri Sat   And   [START ON 06/04/2021] levothyroxine  56 mcg Oral Once per day on Sun   mupirocin ointment  1 application Nasal BID   pantoprazole  40 mg Oral BID   predniSONE  30 mg Oral Q breakfast   Followed by   Melene Muller ON 06/05/2021] predniSONE  20 mg Oral Q breakfast   Followed by   Melene Muller ON 06/10/2021] predniSONE  10 mg Oral Q breakfast   rosuvastatin  5 mg Oral Daily   Continuous Infusions:  heparin 1,250 Units/hr (05/31/21 0721)   piperacillin-tazobactam (ZOSYN)  IV 3.375 g (05/31/21 0440)     LOS: 5 days     Jacquelin Hawking, MD [facility] 05/31/2021, 8:59 AM  If 7PM-7AM, please contact night-coverage [email or web address]

## 2021-05-31 NOTE — Progress Notes (Signed)
BLE venous duplex has been completed.  Preliminary findings given to Joni Reining, Charity fundraiser.  Results can be found under chart review under CV PROC. 05/31/2021 5:05 PM Harvin Konicek RVT, RDMS

## 2021-05-31 NOTE — Consult Note (Signed)
Reason for Consult:Melenic stools with drop in blood count.  Referring Physician: THP.  [name] is an 70 y.o. female.  HPI: [name] is a 70 year old black female, with multiple medical problems listed below, presented to the hospital about 5 days ago with a 2-week history of abdominal pain nausea vomiting and diarrhea and was noted to have an mid-sigmoid colon abscess, noted on the CT of the abdomen pelvis [measuring 9.2 cm x 5.8 cm which could not be drained by IR there was no favorable window available and it was a complex abscess anterior to the uterus and in between loops of distal small bowel] secondary to perforation associated with sigmoid diverticulitis and large PE's laterally [with features of right heart strain] for which she was started on IV Heparin.  However since yesterday she has been having melenic stools with a drop in her hemoglobin from 10.6 g/dL to 8.3 g/dL She has a Flexi-Seal in place now and she has loose melenic stool in the Flexi-Seal tubing.She was quite somnolent during my history taking and therefore work was not able to get details from her.Most of the history been procured from chart review. She claims she has had abdominal pain and nausea with vomiting which comes and goes. I last saw in my office on 12/27/2010 when she had a colonoscopy done which was essentially normal except for internal hemorrhoids. There is a history of recent steroid use starting on 05/31/2021.  Past Medical History:  Diagnosis Date   Anemia    Celiac disease    CHF (congestive heart failure) ([place])    Depression    Diabetes mellitus without complication ([place])    Edema    lower extremities   Emphysema of lung ([place])    Enlarged heart    begining stage   Graves disease    Headache(784.0) 05/18/2013   Heart murmur    Hematochezia    Hemorrhoid    Hyperlipidemia    Hypertension    Migraine    Obese    OSA (obstructive sleep apnea)    Seizures ([place])    Sleep apnea    Stroke  ([place])    left P-O ICH 2007   Thyroid disease    hypothyroidism   Ulcer    Vitamin D deficiency    Past Surgical History:  Procedure Laterality Date   CESAREAN SECTION     HEMORRHOID SURGERY     IUD REMOVAL     TONSILLECTOMY     VEIN SURGERY     Family History  Problem Relation Age of Onset   Heart disease Father    Diabetes Father    Heart failure Father    Diabetes Mother    Thyroid disease Sister    Diabetes Brother    Social History:  reports that she has never smoked. She has never used smokeless tobacco. She reports that she does not drink alcohol and does not use drugs.  Allergies:  Allergies  Allergen Reactions   Gluten Meal     Celiac    Medications: I have reviewed the patient's current medications. Prior to Admission:  Medications Prior to Admission  Medication Sig Dispense Refill Last Dose   albuterol (VENTOLIN HFA) 108 (90 Base) MCG/ACT inhaler Inhale 2 puffs into the lungs every 6 (six) hours as needed for wheezing or shortness of breath.   unknown   amLODipine (NORVASC) 5 MG tablet Take 1 tablet (5 mg total) by mouth daily. 90 tablet 3 05/25/2021  carvedilol (COREG) 25 MG tablet Take 1 tablet (25 mg total) by mouth 2 (two) times daily with a meal. 60 tablet 5 05/25/2021 at 0800   esomeprazole (NEXIUM) 20 MG capsule Take 1 capsule by mouth daily.   05/26/2021   furosemide (LASIX) 20 MG tablet Take 1 tablet (20 mg total) by mouth daily. 7 tablet 0 05/26/2021   levothyroxine (SYNTHROID) 112 MCG tablet Take 1 tablet by mouth Monday through Saturday and 1/2 tablet on Sunday. 30 tablet 3 05/26/2021   metFORMIN (GLUCOPHAGE) 500 MG tablet Take 500 mg by mouth 2 (two) times daily.   05/25/2021   OVER THE COUNTER MEDICATION Take 1 tablet by mouth daily. Medication: Vitamin b12 3000 mcg   05/25/2021   potassium chloride (KLOR-CON) 10 MEQ tablet Take 2 tablets (20 mEq total) by mouth daily. 180 tablet 3 05/25/2021   predniSONE (DELTASONE) 20 MG tablet Take 60 mg by mouth daily  with breakfast.   05/25/2021   rosuvastatin (CRESTOR) 5 MG tablet Take 1 tablet (5 mg total) by mouth daily. 90 tablet 3 05/25/2021   Blood Glucose Monitoring Suppl (ONETOUCH VERIO REFLECT) w/Device KIT 1 each by Other route as directed.      Fluticasone-Salmeterol (ADVAIR) 250-50 MCG/DOSE AEPB Inhale 1 puff into the lungs daily as needed for allergies.   unknown   glucose blood (ONETOUCH VERIO) test strip Use as intstructed to check blood sugar once a day Dx Code E11.9 100 strip 12    Lancets (ONETOUCH ULTRASOFT) lancets Use as instructed 100 each 12    meclizine (ANTIVERT) 25 MG tablet Take 1 tablet by mouth daily as needed for dizziness.   unknown   Scheduled:  insulin aspart  0-15 Units Subcutaneous Q4H   insulin glargine  15 Units Subcutaneous Daily   levothyroxine  112 mcg Oral Once per day on Mon Tue Wed Thu Fri Sat   And   [START ON 06/04/2021] levothyroxine  56 mcg Oral Once per day on Sun   mupirocin ointment  1 application Nasal BID   pantoprazole  40 mg Oral BID   predniSONE  30 mg Oral Q breakfast   Followed by   [START ON 06/05/2021] predniSONE  20 mg Oral Q breakfast   Followed by   [START ON 06/10/2021] predniSONE  10 mg Oral Q breakfast   rosuvastatin  5 mg Oral Daily   Continuous:  piperacillin-tazobactam (ZOSYN)  IV 3.375 g (05/31/21 1148)   PRN:liver oil-zinc oxide, morphine injection, ondansetron (ZOFRAN) IV Anti-infectives (From admission, onward)    Start     Dose/Rate Route Frequency Ordered Stop   05/27/21 0500  piperacillin-tazobactam (ZOSYN) IVPB 3.375 g        3.375 g 12.5 mL/hr over 240 Minutes Intravenous Every 8 hours 05/26/21 2126     06 /10/22 2015  ceFEPIme (MAXIPIME) 2 g in sodium chloride 0.9 % 100 mL IVPB       See Hyperspace for full Linked Orders Report.   2 g 200 mL/hr over 30 Minutes Intravenous  Once 05/26/21 2004 05/26/21 2107   05/26/21 2015  metroNIDAZOLE (FLAGYL) IVPB 500 mg       See Hyperspace for full Linked Orders Report.   500 mg 100  mL/hr over 60 Minutes Intravenous  Once 05/26/21 2004 05/26/21 2135       Results for orders placed or performed during the hospital encounter of 05/26/21 (from the past 48 hour(s))  Glucose, capillary     Status: Abnormal   Collection Time: 05/29/21  4:00 PM  Result Value Ref Range   Glucose-Capillary 198 (H) 70 - 99 mg/dL    Comment: Glucose reference range applies only to samples taken after fasting for at least 8 hours.  Basic metabolic panel     Status: Abnormal   Collection Time: 05/29/21  4:31 PM  Result Value Ref Range   Sodium 136 135 - 145 mmol/L   Potassium 3.5 3.5 - 5.1 mmol/L    Comment: DELTA CHECK NOTED NO VISIBLE HEMOLYSIS    Chloride 102 98 - 111 mmol/L   CO2 23 22 - 32 mmol/L   Glucose, Bld 195 (H) 70 - 99 mg/dL    Comment: Glucose reference range applies only to samples taken after fasting for at least 8 hours.   BUN 13 8 - 23 mg/dL   Creatinine, Ser 1.02 (H) 0.44 - 1.00 mg/dL   Calcium 8.0 (L) 8.9 - 10.3 mg/dL   GFR, Estimated 59 (L) >60 mL/min    Comment: (NOTE) Calculated using the CKD-EPI Creatinine Equation (2021)    Anion gap 11 5 - 15    Comment: Performed at [facility] [address] [place], [place] [address]  Glucose, capillary     Status: Abnormal   Collection Time: 05/29/21  7:46 PM  Result Value Ref Range   Glucose-Capillary 319 (H) 70 - 99 mg/dL    Comment: Glucose reference range applies only to samples taken after fasting for at least 8 hours.  Glucose, capillary     Status: Abnormal   Collection Time: 05/29/21 11:19 PM  Result Value Ref Range   Glucose-Capillary 228 (H) 70 - 99 mg/dL    Comment: Glucose reference range applies only to samples taken after fasting for at least 8 hours.  CBC     Status: Abnormal   Collection Time: 05/30/21  2:08 AM  Result Value Ref Range   WBC 9.6 4.0 - 10.5 K/uL   RBC 3.52 (L) 3.87 - 5.11 MIL/uL   Hemoglobin 11.0 (L) 12.0 - 15.0 g/dL   HCT 33.8 (L) 36.0 - 46.0 %   MCV 96.0 80.0 - 100.0 fL    MCH 31.3 26.0 - 34.0 pg   MCHC 32.5 30.0 - 36.0 g/dL   RDW 15.4 11.5 - 15.5 %   Platelets 111 (L) 150 - 400 K/uL    Comment: CONSISTENT WITH PREVIOUS RESULT   nRBC 0.0 0.0 - 0.2 %    Comment: Performed at [facility], [place] [address], [place], [place] [address]  Heparin level (unfractionated)     Status: None   Collection Time: 05/30/21  2:08 AM  Result Value Ref Range   Heparin Unfractionated 0.34 0.30 - 0.70 IU/mL    Comment: (NOTE) The clinical reportable range upper limit is being lowered to >1.10 to align with the FDA approved guidance for the current laboratory assay.  If heparin results are below expected values, and patient dosage has  been confirmed, suggest follow up testing of antithrombin III levels. Performed at [facility], [place] [address], [place], [place] [address]   Basic metabolic panel     Status: Abnormal   Collection Time: 05/30/21  2:08 AM  Result Value Ref Range   Sodium 135 135 - 145 mmol/L   Potassium 3.4 (L) 3.5 - 5.1 mmol/L   Chloride 103 98 - 111 mmol/L   CO2 24 22 - 32 mmol/L   Glucose, Bld 230 (H) 70 - 99 mg/dL    Comment:  Glucose reference range applies only to samples taken after fasting for at least 8 hours.   BUN 16 8 - 23 mg/dL   Creatinine, Ser 0.96 0.44 - 1.00 mg/dL   Calcium 8.0 (L) 8.9 - 10.3 mg/dL   GFR, Estimated >60 >60 mL/min    Comment: (NOTE) Calculated using the CKD-EPI Creatinine Equation (2021)    Anion gap 8 5 - 15    Comment: Performed at [facility] [address] [place], [place] [address]  Glucose, capillary     Status: Abnormal   Collection Time: 05/30/21  3:19 AM  Result Value Ref Range   Glucose-Capillary 219 (H) 70 - 99 mg/dL    Comment: Glucose reference range applies only to samples taken after fasting for at least 8 hours.  Glucose, capillary     Status: Abnormal   Collection Time: 05/30/21  7:56 AM  Result Value Ref Range   Glucose-Capillary 169 (H) 70 - 99 mg/dL    Comment: Glucose  reference range applies only to samples taken after fasting for at least 8 hours.  Glucose, capillary     Status: Abnormal   Collection Time: 05/30/21 12:53 PM  Result Value Ref Range   Glucose-Capillary 207 (H) 70 - 99 mg/dL    Comment: Glucose reference range applies only to samples taken after fasting for at least 8 hours.  Glucose, capillary     Status: Abnormal   Collection Time: 05/30/21  3:19 PM  Result Value Ref Range   Glucose-Capillary 236 (H) 70 - 99 mg/dL    Comment: Glucose reference range applies only to samples taken after fasting for at least 8 hours.   Comment 1 Notify RN    Comment 2 Document in Chart   C Difficile Quick Screen w PCR reflex     Status: None   Collection Time: 05/30/21  4:57 PM   Specimen: Per Rectum; Stool  Result Value Ref Range   C Diff antigen NEGATIVE NEGATIVE   C Diff toxin NEGATIVE NEGATIVE   C Diff interpretation No C. difficile detected.     Comment: Performed at [facility], [place] [address], [place], [place] [address]  Hemoglobin and hematocrit, blood     Status: Abnormal   Collection Time: 05/30/21  5:36 PM  Result Value Ref Range   Hemoglobin 10.6 (L) 12.0 - 15.0 g/dL   HCT 33.5 (L) 36.0 - 46.0 %    Comment: Performed at [facility], [place] [address], [place], [place] [address]  Glucose, capillary     Status: Abnormal   Collection Time: 05/30/21  8:21 PM  Result Value Ref Range   Glucose-Capillary 329 (H) 70 - 99 mg/dL    Comment: Glucose reference range applies only to samples taken after fasting for at least 8 hours.  Occult blood card to lab, stool RN will collect     Status: Abnormal   Collection Time: 05/30/21  9:13 PM  Result Value Ref Range   Fecal Occult Bld POSITIVE (A) NEGATIVE    Comment: Performed at [facility], [address], [place], [place] [address]  Glucose, capillary     Status: Abnormal   Collection Time: 05/30/21 11:07 PM  Result Value Ref Range   Glucose-Capillary 302 (H) 70 - 99 mg/dL     Comment: Glucose reference range applies only to samples taken after fasting for at least 8 hours.  CBC     Status: Abnormal   Collection Time:  05/31/21  2:21 AM  Result Value Ref Range   WBC 10.7 (H) 4.0 - 10.5 K/uL   RBC 2.91 (L) 3.87 - 5.11 MIL/uL   Hemoglobin 9.1 (L) 12.0 - 15.0 g/dL   HCT 27.9 (L) 36.0 - 46.0 %   MCV 95.9 80.0 - 100.0 fL   MCH 31.3 26.0 - 34.0 pg   MCHC 32.6 30.0 - 36.0 g/dL   RDW 15.3 11.5 - 15.5 %   Platelets 152 150 - 400 K/uL   nRBC 0.0 0.0 - 0.2 %    Comment: Performed at [facility], [place] [address], [place], [place] [address]  Heparin level (unfractionated)     Status: Abnormal   Collection Time: 05/31/21  2:21 AM  Result Value Ref Range   Heparin Unfractionated 0.29 (L) 0.30 - 0.70 IU/mL    Comment: (NOTE) The clinical reportable range upper limit is being lowered to >1.10 to align with the FDA approved guidance for the current laboratory assay.  If heparin results are below expected values, and patient dosage has  been confirmed, suggest follow up testing of antithrombin III levels. Performed at [facility], [place] [address], [place], [place] [address]   Basic metabolic panel     Status: Abnormal   Collection Time: 05/31/21  2:21 AM  Result Value Ref Range   Sodium 135 135 - 145 mmol/L   Potassium 5.1 3.5 - 5.1 mmol/L    Comment: HEMOLYSIS AT THIS LEVEL MAY AFFECT RESULT   Chloride 103 98 - 111 mmol/L   CO2 23 22 - 32 mmol/L   Glucose, Bld 310 (H) 70 - 99 mg/dL    Comment: Glucose reference range applies only to samples taken after fasting for at least 8 hours.   BUN 25 (H) 8 - 23 mg/dL   Creatinine, Ser 0.86 0.44 - 1.00 mg/dL   Calcium 7.9 (L) 8.9 - 10.3 mg/dL   GFR, Estimated >60 >60 mL/min    Comment: (NOTE) Calculated using the CKD-EPI Creatinine Equation (2021)    Anion gap 9 5 - 15    Comment: Performed at [facility] [address] [place], [place] [address]  Glucose, capillary     Status: Abnormal   Collection  Time: 05/31/21  7:52 AM  Result Value Ref Range   Glucose-Capillary 309 (H) 70 - 99 mg/dL    Comment: Glucose reference range applies only to samples taken after fasting for at least 8 hours.  Hemoglobin and hematocrit, blood     Status: Abnormal   Collection Time: 05/31/21 11:03 AM  Result Value Ref Range   Hemoglobin 8.3 (L) 12.0 - 15.0 g/dL   HCT 26.1 (L) 36.0 - 46.0 %    Comment: Performed at [facility] [address] [place], [place] [address]  Glucose, capillary     Status: Abnormal   Collection Time: 05/31/21 11:15 AM  Result Value Ref Range   Glucose-Capillary 211 (H) 70 - 99 mg/dL    Comment: Glucose reference range applies only to samples taken after fasting for at least 8 hours.   Review of Systems  Constitutional:  Positive for activity change, appetite change, chills, diaphoresis and fatigue. Negative for fever and unexpected weight change.  HENT: Negative.    Eyes: Negative.   Respiratory: Negative.    Cardiovascular: Negative.   Gastrointestinal:  Positive for abdominal pain, anal bleeding, blood in stool, diarrhea and nausea. Negative for abdominal distention, constipation, rectal pain and vomiting.  Endocrine: Negative.   Genitourinary: Negative.   Musculoskeletal:  Positive for arthralgias.  Skin: Negative.   Allergic/Immunologic: Negative.   Neurological:  Positive for seizures, weakness, light-headedness and headaches. Negative for tremors, syncope, facial asymmetry, speech difficulty and numbness.  Hematological: Negative.   Psychiatric/Behavioral:  Positive for sleep disturbance. Negative for agitation, behavioral problems, confusion, decreased concentration, dysphoric mood, hallucinations, self-injury and suicidal ideas. The patient is nervous/anxious. The patient is not hyperactive.   Blood pressure 98/76, pulse 100, temperature 98.6 F (37 C), temperature source Oral, resp. rate 18, height 5' 2"  (1.575 m), weight 109.1 kg, SpO2 100 %. Physical  Exam Constitutional:      Appearance: She is ill-appearing.  HENT:     Head: Normocephalic and atraumatic.  Eyes:     General: No scleral icterus.    Pupils: Pupils are equal, round, and reactive to light.     Comments: Bilateral exophthalmos noted  Cardiovascular:     Rate and Rhythm: Normal rate and regular rhythm.     Heart sounds: Normal heart sounds.  Pulmonary:     Effort: Pulmonary effort is normal.     Breath sounds: Normal breath sounds.  Abdominal:     General: Abdomen is protuberant. Bowel sounds are decreased.     Palpations: Abdomen is soft. There is no shifting dullness or fluid wave.     Tenderness: There is no abdominal tenderness.     Comments: Flexi-seal tube is in place with melenic stool noted in the tube there is no evidence of bright red blood   Assessment/Plan: 1) Melena with posthemorrhagic anemia-plans are to proceed with a EGD tomorrow. Patient should be kept n.p.o. ?  Stress ulcer.  2) Complicated diverticulitis with an abscess in the mid-le to drain the abscess as there is no favorable window. Surgery on hold at this time due to other comorbidities.  Repeat scanning planned, later this week to further evaluate the diverticular abscess. 3) Bilateral pulmonary embolus-Heparin has been held due to ongoing melena. Lower extremity Dopplers will be done to rule out DVT. 4) Acute respiratory failure with hypoxia syncope secondary to PE. 5) Hypothyroidism/Graves' disease with exophthalmos. 6) AODM-Hemoglobin A1c is 9.4 7) CKD. 8) Morbid obesity. 9) Celiac sprue 10) Hypokalemia/Hypomagnesemia. 11) Seizure disorder. 12) Asthma. 13) Hypertension/Hyperlipidemia.  Juanita Craver 05/31/2021, 12:35 PM

## 2021-05-31 NOTE — Progress Notes (Signed)
OT Cancellation Note  Patient Details Name: [name] MRN: [id number] DOB: 09/14/1951   Cancelled Treatment:    Reason Eval/Treat Not Completed: Medical issues which prohibited therapy;Fatigue/lethargy limiting ability to participate;Pain limiting ability to participate (Pt having large bloody stools last night and today with a raw bottom per RN with a Hgb drop to 8.6. pt is very lethargic and wanting to rest unable to participate fully. OT to continue to follow for OT eval.)  [name], OTR/L Acute Rehabilitation Services Pager: [phone] Office: [phone]   Daviel Allegretto C 05/31/2021, 1:42 PM

## 2021-05-31 NOTE — Progress Notes (Signed)
Inpatient Diabetes Program Recommendations  AACE/ADA: New Consensus Statement on Inpatient Glycemic Control (2015)  Target Ranges:  Prepandial:   less than 140 mg/dL      Peak postprandial:   less than 180 mg/dL (1-2 hours)      Critically ill patients:  140 - 180 mg/dL   Lab Results  Component Value Date   GLUCAP 211 (H) 05/31/2021   HGBA1C 9.4 (H) 04/22/2021    Review of Glycemic Control Results for [name], [name] (MRN [id number]) as of 05/31/2021 14:22  Ref. Range 05/30/2021 15:19 05/30/2021 20:21 05/30/2021 23:07 05/31/2021 07:52 05/31/2021 11:15  Glucose-Capillary Latest Ref Range: 70 - 99 mg/dL 709 (H) 628 (H) 366 (H) 309 (H) 211 (H)   Diabetes history: DM 2 Outpatient Diabetes medications:  Metformin 500 mg bid Current orders for Inpatient glycemic control:  Novolog moderate q 4 hours (increased today) Lantus 15 units daily (increased today) Prednisone taper  Inpatient Diabetes Program Recommendations:    Agree with orders.  Will follow.   Thanks,  Beryl Meager, RN, BC-ADM Inpatient Diabetes Coordinator Pager [phone]  (8a-5p)

## 2021-05-31 NOTE — Progress Notes (Addendum)
Progress Note     Subjective: CC: significant abdominal pain and nausea with full liquid diet yesterday and back down to clears. Still having nausea this am and she feels like her abdominal pain is worse.  She's had melena and there is question of upper GI bleed  Objective: Vital signs in last 24 hours: Temp:  [97.7 F (36.5 C)-98.9 F (37.2 C)] 98.6 F (37 C) (06/15 1116) Pulse Rate:  [91-100] 100 (06/15 1116) Resp:  [14-20] 18 (06/15 1116) BP: (98-144)/(69-91) 98/76 (06/15 1116) SpO2:  [97 %-100 %] 100 % (06/15 1116) Last BM Date: 05/31/21  Intake/Output from previous day: 06/14 0701 - 06/15 0700 In: 1096.1 [P.O.:920; I.V.:176.1] Out: 325 [Urine:325] Intake/Output this shift: Total I/O In: 110.1 [I.V.:110.1] Out: -   PE: General: pleasant, WD, female who is laying in bed in NAD HEENT: head is normocephalic, atraumatic.  Mouth is pink and moist Heart: regular, rate, and rhythm. Palpable radial and pedal pulses bilaterally Lungs: CTAB.  Respiratory effort nonlabored on supplemental O2 via [place] Abd: soft, +BS, moderate tenderness to palpation of bilateral lower abdomen with guarding MS: all 4 extremities are symmetrical with no cyanosis, clubbing, or edema. Skin: warm and dry with no masses, lesions, or rashes Psych: A&Ox3 with an appropriate affect.    Lab Results:  Recent Labs    05/30/21 0208 05/30/21 1736 05/31/21 0221 05/31/21 1103  WBC 9.6  --  10.7*  --   HGB 11.0*   < > 9.1* 8.3*  HCT 33.8*   < > 27.9* 26.1*  PLT 111*  --  152  --    < > = values in this interval not displayed.   BMET Recent Labs    05/30/21 0208 05/31/21 0221  NA 135 135  K 3.4* 5.1  CL 103 103  CO2 24 23  GLUCOSE 230* 310*  BUN 16 25*  CREATININE 0.96 0.86  CALCIUM 8.0* 7.9*   PT/INR No results for input(s): LABPROT, INR in the last 72 hours. CMP     Component Value Date/Time   NA 135 05/31/2021 0221   NA 141 11/07/2020 1058   K 5.1 05/31/2021 0221   CL 103  05/31/2021 0221   CO2 23 05/31/2021 0221   GLUCOSE 310 (H) 05/31/2021 0221   BUN 25 (H) 05/31/2021 0221   BUN 13 11/07/2020 1058   CREATININE 0.86 05/31/2021 0221   CALCIUM 7.9 (L) 05/31/2021 0221   PROT 4.4 (L) 05/27/2021 0327   PROT 6.5 11/07/2020 1058   ALBUMIN 2.2 (L) 05/27/2021 0327   ALBUMIN 4.3 11/07/2020 1058   AST 13 (L) 05/27/2021 0327   ALT 23 05/27/2021 0327   ALKPHOS 52 05/27/2021 0327   BILITOT 1.0 05/27/2021 0327   BILITOT 0.6 11/07/2020 1058   GFRNONAA >60 05/31/2021 0221   GFRAA 61 11/07/2020 1058   Lipase     Component Value Date/Time   LIPASE 19 05/26/2021 1601       Studies/Results: No results found.  Anti-infectives: Anti-infectives (From admission, onward)    Start     Dose/Rate Route Frequency Ordered Stop   05/27/21 0500  piperacillin-tazobactam (ZOSYN) IVPB 3.375 g        3.375 g 12.5 mL/hr over 240 Minutes Intravenous Every 8 hours 05/26/21 2126     05/26/21 2015  ceFEPIme (MAXIPIME) 2 g in sodium chloride 0.9 % 100 mL IVPB       See Hyperspace for full Linked Orders Report.   2 g 200 mL/hr over  30 Minutes Intravenous  Once 05/26/21 2004 05/26/21 2107   05/26/21 2015  metroNIDAZOLE (FLAGYL) IVPB 500 mg       See Hyperspace for full Linked Orders Report.   500 mg 100 mL/hr over 60 Minutes Intravenous  Once 05/26/21 2004 05/26/21 2135        Assessment/Plan  48F Diverticulitis with intra-abdominal abscess (9x5 cm) -afebrile, low blood pressure, WBC 10.7 (9.6) - IR without a safe window for abscess drainage 6/12 -moderate tenderness on exam today and did not tolerate FLD. Continue clears - continue IV abx. - consider repeat CT scan tomorrow vs or exploration given her worsening exam for follow up/treatment of intraabd abscess. Given the size of the abscess, it may not fully resolve without IR drainage or surgery. - GI evaluation pending for possible UGI bleed. May need to coordinate abscess treatment in regard to potential GI  interventions - No emergent surgical needs today.   - OOB/mobilize with therapies    FEN: NPO pending GI eval, NPO MN in case of surgery tomorrow PN:TIRWE VTE: SCD's, hep gtt Foley: none, external cath Dispo: SDU   - Below per primary service - Bilateral PE-heparin IV Acute respiratory failure with hypoxia Thombocytopenia Chronic diastolic heart failure CKD IIIa HTN DM2 Morbid obesity      LOS: 5 days    Eric Form, [facility] Surgery 05/31/2021, 12:21 PM Please see Amion for pager number during day hours 7:00am-4:30pm

## 2021-05-31 NOTE — Progress Notes (Signed)
PHARMACY - TOTAL PARENTERAL NUTRITION CONSULT NOTE   Indication:  Intolerance to enteral feeding  Patient Measurements: Height: 5\' 2"  (157.5 cm) Weight: 109.1 kg (240 lb 8.4 oz) IBW/kg (Calculated) : 50.1 TPN AdjBW (KG): 64.9 Body mass index is 43.99 kg/m.  Assessment: 70 years of age female with known celiac disease, CHF, HTN, and DM admitted on 05/26/21 with perforated sigmoid diverticulitis and abscess as well as pulmonary embolism. IR unable to find a safe window for abscess drain placement and General Surgery recommending no surgery at this time. Attempted full liquids but had increased abdominal pain and loose stools so backed back down to clear liquids. Currently having bowel movements. Day #5 of hospital stay without adequate intake and patient has had N/V/D for 2 weeks prior to admission.   Glucose / Insulin: DM with uncontrolled CBGs - currently requiring Lantus 15 and SSI. Steroids contributing.  Electrolytes: K 5.1, Last Mg 1.7. No recent phosphorus available.  Renal: SCr 0.86 Hepatic: LFTs/Tbili wnl Intake / Output; MIVF:  GI Imaging: plan for repeat CT Thursday or Friday GI Surgeries / Procedures: none  Central access: PICC to be placed TPN start date: Plan for 6/16  Nutritional Goals (per RD recommendation on 05/29/21): kCal: 2000-2200, Protein: 110-120, Fluid: >= 2L  Current Nutrition:  NPO  Plan:  Ordered after cutoff time for TPN compounding - Will start TPN on 05/31/21.  TPN labs ordered.   06/02/21, PharmD, BCPS, BCCCP Clinical Pharmacist Please refer to [facility] for [facility] numbers 05/31/2021,3:49 PM

## 2021-05-31 NOTE — Progress Notes (Signed)
[facility]  Date of Admission:  05/26/2021           Reason for visit: Follow up on intra-abdominal abscess  Current antibiotics: Pip tazo 6/10-- present   Previous antibiotics: Cefepime 6/10 x1 MTZ 6/10 x1  ASSESSMENT:    Intra-abdominal abscess: Secondary to perforated diverticulitis and initially measuring 9.2 x 5.8 cm.  Not amenable to IR drainage at this time.  Surgery evaluated and tentatively planning for repeat CT scan later this week to follow-up given concerns that this will not fully resolve without some sort of drainage or surgery given the abscess size.  She has not been tolerating advancement of diet thus far.  She continues on piperacillin tazobactam. Melena: Complicated by heparin drip required for GI bleed.  Gastroenterology consult pending. Bilateral pulmonary embolism: Continues on heparin drip. CKD: Creatinine clearance this morning is 70.8 with a creatinine of 0.86. Diabetes: A1c is 9.4. Morbid obesity: Body mass index is 43.99 kg/m.  PLAN:    Continue piperacillin tazobactam Agree with repeating her CT scan later this week to reassess her abscess in hopes that there will be a safe pocket for drainage or potentially that she may need surgical intervention GI evaluation pending given concern for GI bleed.  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 feeding supplement  237 mL Oral BID BM  . insulin aspart  0-15 Units Subcutaneous Q4H  . insulin glargine  15 Units Subcutaneous Daily  .  levothyroxine  112 mcg Oral Once per day on Mon Tue Wed Thu Fri Sat   And  . [START ON 06/04/2021] levothyroxine  56 mcg Oral Once per day on Sun  . mupirocin ointment  1 application Nasal BID  . pantoprazole  40 mg Oral BID  . predniSONE  30 mg Oral Q breakfast   Followed by  . [START ON 06/05/2021] predniSONE  20 mg Oral Q breakfast   Followed by  . [START ON 06/10/2021] predniSONE  10 mg Oral Q breakfast  . rosuvastatin  5 mg Oral Daily   Continuous Infusions: . heparin 1,250 Units/hr (05/31/21 0721)  . piperacillin-tazobactam (ZOSYN)  IV 3.375 g (05/31/21 0440)   PRN Meds:.liver oil-zinc oxide, morphine injection, ondansetron (ZOFRAN) IV  SUBJECTIVE:   24 hour events:  Concerns for GI bleed noted overnight Afebrile WBC 10.7 Creatinine stable No new micro No new imaging  Patient seen at the bedside with her daughter.  Denies any acute new complaints.  She has some irritation on her backside from stool contamination and had a rectal tube placed.  She reports increased abdominal pain with full liquid diet and less pain when tolerating clears.  She reports some nausea but no vomiting.  No fevers.    Review of Systems  All other systems reviewed and are negative.    OBJECTIVE:   Blood pressure 116/88, pulse 92, temperature 98.9 F (37.2 C), temperature source Axillary, resp. rate 17, height 5\' 2"  (1.575 m), weight 109.1 kg, SpO2 100 %. Body mass index is 43.99 kg/m.  Physical Exam Constitutional:  Appearance: Normal appearance. She is ill-appearing.  HENT:     Head: Normocephalic and atraumatic.  Eyes:     Extraocular Movements: Extraocular movements intact.     Conjunctiva/sclera: Conjunctivae normal.  Pulmonary:     Effort: Pulmonary effort is normal. No respiratory distress.     Comments: On nasal cannula. Musculoskeletal:     Cervical back: Normal range of motion and neck supple.  Skin:    General: Skin is warm and dry.     Findings: No rash.   Neurological:     General: No focal deficit present.     Mental Status: She is alert and oriented to person, place, and time.  Psychiatric:        Mood and Affect: Mood normal.        Behavior: Behavior normal.     Lab Results: Lab Results  Component Value Date   WBC 10.7 (H) 05/31/2021   HGB 9.1 (L) 05/31/2021   HCT 27.9 (L) 05/31/2021   MCV 95.9 05/31/2021   PLT 152 05/31/2021    Lab Results  Component Value Date   NA 135 05/31/2021   K 5.1 05/31/2021   CO2 23 05/31/2021   GLUCOSE 310 (H) 05/31/2021   BUN 25 (H) 05/31/2021   CREATININE 0.86 05/31/2021   CALCIUM 7.9 (L) 05/31/2021   GFRNONAA >60 05/31/2021   GFRAA 61 11/07/2020    Lab Results  Component Value Date   ALT 23 05/27/2021   AST 13 (L) 05/27/2021   ALKPHOS 52 05/27/2021   BILITOT 1.0 05/27/2021    No results found for: CRP  No results found for: ESRSEDRATE   I have reviewed the micro and lab results in Epic.  Imaging: No results found.   Imaging independently reviewed in Epic.    Vedia Coffer for Infectious Disease [facility] [phone] pager 05/31/2021, 10:13 AM

## 2021-05-31 NOTE — Progress Notes (Signed)
ANTICOAGULATION CONSULT NOTE - Follow Up Consult  Pharmacy Consult for Heparin Indication: pulmonary embolus  Allergies  Allergen Reactions   Gluten Meal     Celiac    Patient Measurements: Height: 5\' 2"  (157.5 cm) Weight: 109.1 kg (240 lb 8.4 oz) IBW/kg (Calculated) : 50.1 Heparin Dosing Weight:    Vital Signs: Temp: 98.2 F (36.8 C) (06/15 0417) Temp Source: Oral (06/15 0417) BP: 118/87 (06/15 0417) Pulse Rate: 91 (06/15 0417)  Labs: Recent Labs    05/29/21 0027 05/29/21 1631 05/30/21 0208 05/30/21 1736 05/31/21 0221  HGB 10.6*  --  11.0* 10.6* 9.1*  HCT 33.3*  --  33.8* 33.5* 27.9*  PLT 88*  --  111*  --  152  HEPARINUNFRC 0.39  --  0.34  --  0.29*  CREATININE  --  1.02* 0.96  --  0.86    Estimated Creatinine Clearance: 70.8 mL/min (by C-G formula based on SCr of 0.86 mg/dL).   Assessment:  Anticoag: IV heparin for acute BL PE with RHS - Hep level 0.29 low. Hgb 10.6>9.1? positive fecal occult test noted. Plts up to 152. - 6/14 PM:  large liquid dark, thick, tarry stool.  Some blood clots noted as well. MD notified   Goal of Therapy:  Heparin level 0.3-0.7 units/ml Monitor platelets by anticoagulation protocol: Yes   Plan:   Increase Heparin drip to 1250 units/hr Monitor daily HL, CBC and s/s of bleeding  Zosyn 3.375g IV q8hr. Needs long-acting insulin coverage. Ca Gluconate 2g IV x 1 this AM (Ca adjusts to 9.34 with albumin 2.2)   Ellianna Ruest S. 09-17-1973, PharmD, BCPS Clinical Staff Pharmacist [email or web address]  Merilynn Finland, Aaryn Sermon Stillinger 05/31/2021,7:26 AM

## 2021-05-31 NOTE — Progress Notes (Signed)
Inpatient Rehab Admissions Coordinator:   Consult received.  Note ongoing medical workup for drop in Hgb, melenotic stool, and possible need for intervention to treat abscess.  Will f/u with patient once treatment plan in place.    Estill Dooms, PT, DPT Admissions Coordinator [phone] 05/31/21  1:53 PM

## 2021-05-31 NOTE — H&P (View-Only) (Signed)
Reason for Consult:Melenic stools with drop in blood count.  Referring Physician: THP.  [name] is an 70 y.o. female.  HPI: [name] is a 70 year old black female, with multiple medical problems listed below, presented to the hospital about 5 days ago with a 2-week history of abdominal pain nausea vomiting and diarrhea and was noted to have an mid-sigmoid colon abscess, noted on the CT of the abdomen pelvis [measuring 9.2 cm x 5.8 cm which could not be drained by IR there was no favorable window available and it was a complex abscess anterior to the uterus and in between loops of distal small bowel] secondary to perforation associated with sigmoid diverticulitis and large PE's laterally [with features of right heart strain] for which she was started on IV Heparin.  However since yesterday she has been having melenic stools with a drop in her hemoglobin from 10.6 g/dL to 8.3 g/dL She has a Flexi-Seal in place now and she has loose melenic stool in the Flexi-Seal tubing.She was quite somnolent during my history taking and therefore work was not able to get details from her.Most of the history been procured from chart review. She claims she has had abdominal pain and nausea with vomiting which comes and goes. I last saw in my office on 12/27/2010 when she had a colonoscopy done which was essentially normal except for internal hemorrhoids. There is a history of recent steroid use starting on 05/31/2021.  Past Medical History:  Diagnosis Date   Anemia    Celiac disease    CHF (congestive heart failure) ([place])    Depression    Diabetes mellitus without complication ([place])    Edema    lower extremities   Emphysema of lung ([place])    Enlarged heart    begining stage   Graves disease    Headache(784.0) 05/18/2013   Heart murmur    Hematochezia    Hemorrhoid    Hyperlipidemia    Hypertension    Migraine    Obese    OSA (obstructive sleep apnea)    Seizures ([place])    Sleep apnea    Stroke  ([place])    left P-O ICH 2007   Thyroid disease    hypothyroidism   Ulcer    Vitamin D deficiency    Past Surgical History:  Procedure Laterality Date   CESAREAN SECTION     HEMORRHOID SURGERY     IUD REMOVAL     TONSILLECTOMY     VEIN SURGERY     Family History  Problem Relation Age of Onset   Heart disease Father    Diabetes Father    Heart failure Father    Diabetes Mother    Thyroid disease Sister    Diabetes Brother    Social History:  reports that she has never smoked. She has never used smokeless tobacco. She reports that she does not drink alcohol and does not use drugs.  Allergies:  Allergies  Allergen Reactions   Gluten Meal     Celiac    Medications: I have reviewed the patient's current medications. Prior to Admission:  Medications Prior to Admission  Medication Sig Dispense Refill Last Dose   albuterol (VENTOLIN HFA) 108 (90 Base) MCG/ACT inhaler Inhale 2 puffs into the lungs every 6 (six) hours as needed for wheezing or shortness of breath.   unknown   amLODipine (NORVASC) 5 MG tablet Take 1 tablet (5 mg total) by mouth daily. 90 tablet 3 05/25/2021  carvedilol (COREG) 25 MG tablet Take 1 tablet (25 mg total) by mouth 2 (two) times daily with a meal. 60 tablet 5 05/25/2021 at 0800   esomeprazole (NEXIUM) 20 MG capsule Take 1 capsule by mouth daily.   05/26/2021   furosemide (LASIX) 20 MG tablet Take 1 tablet (20 mg total) by mouth daily. 7 tablet 0 05/26/2021   levothyroxine (SYNTHROID) 112 MCG tablet Take 1 tablet by mouth Monday through Saturday and 1/2 tablet on Sunday. 30 tablet 3 05/26/2021   metFORMIN (GLUCOPHAGE) 500 MG tablet Take 500 mg by mouth 2 (two) times daily.   05/25/2021   OVER THE COUNTER MEDICATION Take 1 tablet by mouth daily. Medication: Vitamin b12 3000 mcg   05/25/2021   potassium chloride (KLOR-CON) 10 MEQ tablet Take 2 tablets (20 mEq total) by mouth daily. 180 tablet 3 05/25/2021   predniSONE (DELTASONE) 20 MG tablet Take 60 mg by mouth daily  with breakfast.   05/25/2021   rosuvastatin (CRESTOR) 5 MG tablet Take 1 tablet (5 mg total) by mouth daily. 90 tablet 3 05/25/2021   Blood Glucose Monitoring Suppl (ONETOUCH VERIO REFLECT) w/Device KIT 1 each by Other route as directed.      Fluticasone-Salmeterol (ADVAIR) 250-50 MCG/DOSE AEPB Inhale 1 puff into the lungs daily as needed for allergies.   unknown   glucose blood (ONETOUCH VERIO) test strip Use as intstructed to check blood sugar once a day Dx Code E11.9 100 strip 12    Lancets (ONETOUCH ULTRASOFT) lancets Use as instructed 100 each 12    meclizine (ANTIVERT) 25 MG tablet Take 1 tablet by mouth daily as needed for dizziness.   unknown   Scheduled:  insulin aspart  0-15 Units Subcutaneous Q4H   insulin glargine  15 Units Subcutaneous Daily   levothyroxine  112 mcg Oral Once per day on Mon Tue Wed Thu Fri Sat   And   [START ON 06/04/2021] levothyroxine  56 mcg Oral Once per day on Sun   mupirocin ointment  1 application Nasal BID   pantoprazole  40 mg Oral BID   predniSONE  30 mg Oral Q breakfast   Followed by   [START ON 06/05/2021] predniSONE  20 mg Oral Q breakfast   Followed by   [START ON 06/10/2021] predniSONE  10 mg Oral Q breakfast   rosuvastatin  5 mg Oral Daily   Continuous:  piperacillin-tazobactam (ZOSYN)  IV 3.375 g (05/31/21 1148)   PRN:liver oil-zinc oxide, morphine injection, ondansetron (ZOFRAN) IV Anti-infectives (From admission, onward)    Start     Dose/Rate Route Frequency Ordered Stop   05/27/21 0500  piperacillin-tazobactam (ZOSYN) IVPB 3.375 g        3.375 g 12.5 mL/hr over 240 Minutes Intravenous Every 8 hours 05/26/21 2126     06 /10/22 2015  ceFEPIme (MAXIPIME) 2 g in sodium chloride 0.9 % 100 mL IVPB       See Hyperspace for full Linked Orders Report.   2 g 200 mL/hr over 30 Minutes Intravenous  Once 05/26/21 2004 05/26/21 2107   05/26/21 2015  metroNIDAZOLE (FLAGYL) IVPB 500 mg       See Hyperspace for full Linked Orders Report.   500 mg 100  mL/hr over 60 Minutes Intravenous  Once 05/26/21 2004 05/26/21 2135       Results for orders placed or performed during the hospital encounter of 05/26/21 (from the past 48 hour(s))  Glucose, capillary     Status: Abnormal   Collection Time: 05/29/21  4:00 PM  Result Value Ref Range   Glucose-Capillary 198 (H) 70 - 99 mg/dL    Comment: Glucose reference range applies only to samples taken after fasting for at least 8 hours.  Basic metabolic panel     Status: Abnormal   Collection Time: 05/29/21  4:31 PM  Result Value Ref Range   Sodium 136 135 - 145 mmol/L   Potassium 3.5 3.5 - 5.1 mmol/L    Comment: DELTA CHECK NOTED NO VISIBLE HEMOLYSIS    Chloride 102 98 - 111 mmol/L   CO2 23 22 - 32 mmol/L   Glucose, Bld 195 (H) 70 - 99 mg/dL    Comment: Glucose reference range applies only to samples taken after fasting for at least 8 hours.   BUN 13 8 - 23 mg/dL   Creatinine, Ser 1.02 (H) 0.44 - 1.00 mg/dL   Calcium 8.0 (L) 8.9 - 10.3 mg/dL   GFR, Estimated 59 (L) >60 mL/min    Comment: (NOTE) Calculated using the CKD-EPI Creatinine Equation (2021)    Anion gap 11 5 - 15    Comment: Performed at [facility] [address], [place], [place] [address]  Glucose, capillary     Status: Abnormal   Collection Time: 05/29/21  7:46 PM  Result Value Ref Range   Glucose-Capillary 319 (H) 70 - 99 mg/dL    Comment: Glucose reference range applies only to samples taken after fasting for at least 8 hours.  Glucose, capillary     Status: Abnormal   Collection Time: 05/29/21 11:19 PM  Result Value Ref Range   Glucose-Capillary 228 (H) 70 - 99 mg/dL    Comment: Glucose reference range applies only to samples taken after fasting for at least 8 hours.  CBC     Status: Abnormal   Collection Time: 05/30/21  2:08 AM  Result Value Ref Range   WBC 9.6 4.0 - 10.5 K/uL   RBC 3.52 (L) 3.87 - 5.11 MIL/uL   Hemoglobin 11.0 (L) 12.0 - 15.0 g/dL   HCT 33.8 (L) 36.0 - 46.0 %   MCV 96.0 80.0 - 100.0 fL    MCH 31.3 26.0 - 34.0 pg   MCHC 32.5 30.0 - 36.0 g/dL   RDW 15.4 11.5 - 15.5 %   Platelets 111 (L) 150 - 400 K/uL    Comment: CONSISTENT WITH PREVIOUS RESULT   nRBC 0.0 0.0 - 0.2 %    Comment: Performed at [facility], [place] [address], [place], [place] [address]  Heparin level (unfractionated)     Status: None   Collection Time: 05/30/21  2:08 AM  Result Value Ref Range   Heparin Unfractionated 0.34 0.30 - 0.70 IU/mL    Comment: (NOTE) The clinical reportable range upper limit is being lowered to >1.10 to align with the FDA approved guidance for the current laboratory assay.  If heparin results are below expected values, and patient dosage has  been confirmed, suggest follow up testing of antithrombin III levels. Performed at [facility], [place] [address] [place], [place] [address]   Basic metabolic panel     Status: Abnormal   Collection Time: 05/30/21  2:08 AM  Result Value Ref Range   Sodium 135 135 - 145 mmol/L   Potassium 3.4 (L) 3.5 - 5.1 mmol/L   Chloride 103 98 - 111 mmol/L   CO2 24 22 - 32 mmol/L   Glucose, Bld 230 (H) 70 - 99 mg/dL    Comment:  Glucose reference range applies only to samples taken after fasting for at least 8 hours.   BUN 16 8 - 23 mg/dL   Creatinine, Ser 0.96 0.44 - 1.00 mg/dL   Calcium 8.0 (L) 8.9 - 10.3 mg/dL   GFR, Estimated >60 >60 mL/min    Comment: (NOTE) Calculated using the CKD-EPI Creatinine Equation (2021)    Anion gap 8 5 - 15    Comment: Performed at [facility] [address], [place], [place] [address]  Glucose, capillary     Status: Abnormal   Collection Time: 05/30/21  3:19 AM  Result Value Ref Range   Glucose-Capillary 219 (H) 70 - 99 mg/dL    Comment: Glucose reference range applies only to samples taken after fasting for at least 8 hours.  Glucose, capillary     Status: Abnormal   Collection Time: 05/30/21  7:56 AM  Result Value Ref Range   Glucose-Capillary 169 (H) 70 - 99 mg/dL    Comment: Glucose  reference range applies only to samples taken after fasting for at least 8 hours.  Glucose, capillary     Status: Abnormal   Collection Time: 05/30/21 12:53 PM  Result Value Ref Range   Glucose-Capillary 207 (H) 70 - 99 mg/dL    Comment: Glucose reference range applies only to samples taken after fasting for at least 8 hours.  Glucose, capillary     Status: Abnormal   Collection Time: 05/30/21  3:19 PM  Result Value Ref Range   Glucose-Capillary 236 (H) 70 - 99 mg/dL    Comment: Glucose reference range applies only to samples taken after fasting for at least 8 hours.   Comment 1 Notify RN    Comment 2 Document in Chart   C Difficile Quick Screen w PCR reflex     Status: None   Collection Time: 05/30/21  4:57 PM   Specimen: Per Rectum; Stool  Result Value Ref Range   C Diff antigen NEGATIVE NEGATIVE   C Diff toxin NEGATIVE NEGATIVE   C Diff interpretation No C. difficile detected.     Comment: Performed at [facility], [place] [address] [place], [place] [address]  Hemoglobin and hematocrit, blood     Status: Abnormal   Collection Time: 05/30/21  5:36 PM  Result Value Ref Range   Hemoglobin 10.6 (L) 12.0 - 15.0 g/dL   HCT 33.5 (L) 36.0 - 46.0 %    Comment: Performed at [facility], [place] [address], [place], [place] [address]  Glucose, capillary     Status: Abnormal   Collection Time: 05/30/21  8:21 PM  Result Value Ref Range   Glucose-Capillary 329 (H) 70 - 99 mg/dL    Comment: Glucose reference range applies only to samples taken after fasting for at least 8 hours.  Occult blood card to lab, stool RN will collect     Status: Abnormal   Collection Time: 05/30/21  9:13 PM  Result Value Ref Range   Fecal Occult Bld POSITIVE (A) NEGATIVE    Comment: Performed at [facility], [address] [place], [place] [address]  Glucose, capillary     Status: Abnormal   Collection Time: 05/30/21 11:07 PM  Result Value Ref Range   Glucose-Capillary 302 (H) 70 - 99 mg/dL     Comment: Glucose reference range applies only to samples taken after fasting for at least 8 hours.  CBC     Status: Abnormal   Collection Time:  05/31/21  2:21 AM  Result Value Ref Range   WBC 10.7 (H) 4.0 - 10.5 K/uL   RBC 2.91 (L) 3.87 - 5.11 MIL/uL   Hemoglobin 9.1 (L) 12.0 - 15.0 g/dL   HCT 27.9 (L) 36.0 - 46.0 %   MCV 95.9 80.0 - 100.0 fL   MCH 31.3 26.0 - 34.0 pg   MCHC 32.6 30.0 - 36.0 g/dL   RDW 15.3 11.5 - 15.5 %   Platelets 152 150 - 400 K/uL   nRBC 0.0 0.0 - 0.2 %    Comment: Performed at [facility], [place] [address] [place], [place] [address]  Heparin level (unfractionated)     Status: Abnormal   Collection Time: 05/31/21  2:21 AM  Result Value Ref Range   Heparin Unfractionated 0.29 (L) 0.30 - 0.70 IU/mL    Comment: (NOTE) The clinical reportable range upper limit is being lowered to >1.10 to align with the FDA approved guidance for the current laboratory assay.  If heparin results are below expected values, and patient dosage has  been confirmed, suggest follow up testing of antithrombin III levels. Performed at [facility], [place] [address], [place], [place] [address]   Basic metabolic panel     Status: Abnormal   Collection Time: 05/31/21  2:21 AM  Result Value Ref Range   Sodium 135 135 - 145 mmol/L   Potassium 5.1 3.5 - 5.1 mmol/L    Comment: HEMOLYSIS AT THIS LEVEL MAY AFFECT RESULT   Chloride 103 98 - 111 mmol/L   CO2 23 22 - 32 mmol/L   Glucose, Bld 310 (H) 70 - 99 mg/dL    Comment: Glucose reference range applies only to samples taken after fasting for at least 8 hours.   BUN 25 (H) 8 - 23 mg/dL   Creatinine, Ser 0.86 0.44 - 1.00 mg/dL   Calcium 7.9 (L) 8.9 - 10.3 mg/dL   GFR, Estimated >60 >60 mL/min    Comment: (NOTE) Calculated using the CKD-EPI Creatinine Equation (2021)    Anion gap 9 5 - 15    Comment: Performed at [facility] [address] [place], [place] [address]  Glucose, capillary     Status: Abnormal   Collection  Time: 05/31/21  7:52 AM  Result Value Ref Range   Glucose-Capillary 309 (H) 70 - 99 mg/dL    Comment: Glucose reference range applies only to samples taken after fasting for at least 8 hours.  Hemoglobin and hematocrit, blood     Status: Abnormal   Collection Time: 05/31/21 11:03 AM  Result Value Ref Range   Hemoglobin 8.3 (L) 12.0 - 15.0 g/dL   HCT 26.1 (L) 36.0 - 46.0 %    Comment: Performed at [facility] [address], [place], [place] [address]  Glucose, capillary     Status: Abnormal   Collection Time: 05/31/21 11:15 AM  Result Value Ref Range   Glucose-Capillary 211 (H) 70 - 99 mg/dL    Comment: Glucose reference range applies only to samples taken after fasting for at least 8 hours.   Review of Systems  Constitutional:  Positive for activity change, appetite change, chills, diaphoresis and fatigue. Negative for fever and unexpected weight change.  HENT: Negative.    Eyes: Negative.   Respiratory: Negative.    Cardiovascular: Negative.   Gastrointestinal:  Positive for abdominal pain, anal bleeding, blood in stool, diarrhea and nausea. Negative for abdominal distention, constipation, rectal pain and vomiting.  Endocrine: Negative.   Genitourinary: Negative.   Musculoskeletal:  Positive for arthralgias.  Skin: Negative.   Allergic/Immunologic: Negative.   Neurological:  Positive for seizures, weakness, light-headedness and headaches. Negative for tremors, syncope, facial asymmetry, speech difficulty and numbness.  Hematological: Negative.   Psychiatric/Behavioral:  Positive for sleep disturbance. Negative for agitation, behavioral problems, confusion, decreased concentration, dysphoric mood, hallucinations, self-injury and suicidal ideas. The patient is nervous/anxious. The patient is not hyperactive.   Blood pressure 98/76, pulse 100, temperature 98.6 F (37 C), temperature source Oral, resp. rate 18, height 5' 2"  (1.575 m), weight 109.1 kg, SpO2 100 %. Physical  Exam Constitutional:      Appearance: She is ill-appearing.  HENT:     Head: Normocephalic and atraumatic.  Eyes:     General: No scleral icterus.    Pupils: Pupils are equal, round, and reactive to light.     Comments: Bilateral exophthalmos noted  Cardiovascular:     Rate and Rhythm: Normal rate and regular rhythm.     Heart sounds: Normal heart sounds.  Pulmonary:     Effort: Pulmonary effort is normal.     Breath sounds: Normal breath sounds.  Abdominal:     General: Abdomen is protuberant. Bowel sounds are decreased.     Palpations: Abdomen is soft. There is no shifting dullness or fluid wave.     Tenderness: There is no abdominal tenderness.     Comments: Flexi-seal tube is in place with melenic stool noted in the tube there is no evidence of bright red blood   Assessment/Plan: 1) Melena with posthemorrhagic anemia-plans are to proceed with a EGD tomorrow. Patient should be kept n.p.o. ?  Stress ulcer.  2) Complicated diverticulitis with an abscess in the mid-le to drain the abscess as there is no favorable window. Surgery on hold at this time due to other comorbidities.  Repeat scanning planned, later this week to further evaluate the diverticular abscess. 3) Bilateral pulmonary embolus-Heparin has been held due to ongoing melena. Lower extremity Dopplers will be done to rule out DVT. 4) Acute respiratory failure with hypoxia syncope secondary to PE. 5) Hypothyroidism/Graves' disease with exophthalmos. 6) AODM-Hemoglobin A1c is 9.4 7) CKD. 8) Morbid obesity. 9) Celiac sprue 10) Hypokalemia/Hypomagnesemia. 11) Seizure disorder. 12) Asthma. 13) Hypertension/Hyperlipidemia.  Juanita Craver 05/31/2021, 12:35 PM

## 2021-05-31 NOTE — Progress Notes (Signed)
Oncall inform about drop in hgb from10.6 to 9.1, positive fecal occult test and current infusion of heparin. Keep heparin infusion for now. Order received for flexiseal to help with MASD of perineum.

## 2021-06-01 ENCOUNTER — Inpatient Hospital Stay (HOSPITAL_COMMUNITY): Payer: Medicare HMO | Admitting: Certified Registered"

## 2021-06-01 ENCOUNTER — Inpatient Hospital Stay (HOSPITAL_COMMUNITY): Payer: Medicare HMO

## 2021-06-01 ENCOUNTER — Encounter (HOSPITAL_COMMUNITY): Payer: Self-pay | Admitting: Internal Medicine

## 2021-06-01 ENCOUNTER — Encounter (HOSPITAL_COMMUNITY)
Admission: EM | Disposition: A | Discharge status: Skilled Nursing Facility | Payer: Self-pay | Source: Home / Self Care | Attending: Internal Medicine

## 2021-06-01 DIAGNOSIS — R569 Unspecified convulsions: Secondary | ICD-10-CM

## 2021-06-01 DIAGNOSIS — I82432 Acute embolism and thrombosis of left popliteal vein: Secondary | ICD-10-CM

## 2021-06-01 DIAGNOSIS — K922 Gastrointestinal hemorrhage, unspecified: Secondary | ICD-10-CM

## 2021-06-01 HISTORY — PX: IR IVC FILTER PLMT / S&I /IMG GUID/MOD SED: IMG701

## 2021-06-01 HISTORY — PX: IR US GUIDE VASC ACCESS RIGHT: IMG2390

## 2021-06-01 HISTORY — PX: ESOPHAGOGASTRODUODENOSCOPY (EGD) WITH PROPOFOL: SHX5813

## 2021-06-01 LAB — DIFFERENTIAL
Abs Immature Granulocytes: 0.4 10*3/uL — ABNORMAL HIGH (ref 0.00–0.07)
Basophils Absolute: 0 10*3/uL (ref 0.0–0.1)
Basophils Relative: 0 %
Eosinophils Absolute: 0 10*3/uL (ref 0.0–0.5)
Eosinophils Relative: 0 %
Immature Granulocytes: 3 %
Lymphocytes Relative: 15 %
Lymphs Abs: 1.9 10*3/uL (ref 0.7–4.0)
Monocytes Absolute: 0.8 10*3/uL (ref 0.1–1.0)
Monocytes Relative: 6 %
Neutro Abs: 9.8 10*3/uL — ABNORMAL HIGH (ref 1.7–7.7)
Neutrophils Relative %: 76 %

## 2021-06-01 LAB — COMPREHENSIVE METABOLIC PANEL
ALT: 19 U/L (ref 0–44)
AST: 16 U/L (ref 15–41)
Albumin: 1.7 g/dL — ABNORMAL LOW (ref 3.5–5.0)
Alkaline Phosphatase: 99 U/L (ref 38–126)
Anion gap: 7 (ref 5–15)
BUN: 30 mg/dL — ABNORMAL HIGH (ref 8–23)
CO2: 25 mmol/L (ref 22–32)
Calcium: 8.3 mg/dL — ABNORMAL LOW (ref 8.9–10.3)
Chloride: 107 mmol/L (ref 98–111)
Creatinine, Ser: 1.16 mg/dL — ABNORMAL HIGH (ref 0.44–1.00)
GFR, Estimated: 51 mL/min — ABNORMAL LOW (ref >60)
Glucose, Bld: 107 mg/dL — ABNORMAL HIGH (ref 70–99)
Potassium: 4.2 mmol/L (ref 3.5–5.1)
Sodium: 139 mmol/L (ref 135–145)
Total Bilirubin: 0.7 mg/dL (ref 0.3–1.2)
Total Protein: 4.1 g/dL — ABNORMAL LOW (ref 6.5–8.1)

## 2021-06-01 LAB — CBC
HCT: 22.5 % — ABNORMAL LOW (ref 36.0–46.0)
Hemoglobin: 7.2 g/dL — ABNORMAL LOW (ref 12.0–15.0)
MCH: 30.9 pg (ref 26.0–34.0)
MCHC: 32 g/dL (ref 30.0–36.0)
MCV: 96.6 fL (ref 80.0–100.0)
Platelets: 154 10*3/uL (ref 150–400)
RBC: 2.33 MIL/uL — ABNORMAL LOW (ref 3.87–5.11)
RDW: 15.5 % (ref 11.5–15.5)
WBC: 12.9 10*3/uL — ABNORMAL HIGH (ref 4.0–10.5)
nRBC: 0.5 % — ABNORMAL HIGH (ref 0.0–0.2)

## 2021-06-01 LAB — GLUCOSE, CAPILLARY
Glucose-Capillary: 107 mg/dL — ABNORMAL HIGH (ref 70–99)
Glucose-Capillary: 126 mg/dL — ABNORMAL HIGH (ref 70–99)
Glucose-Capillary: 154 mg/dL — ABNORMAL HIGH (ref 70–99)
Glucose-Capillary: 227 mg/dL — ABNORMAL HIGH (ref 70–99)
Glucose-Capillary: 314 mg/dL — ABNORMAL HIGH (ref 70–99)
Glucose-Capillary: 341 mg/dL — ABNORMAL HIGH (ref 70–99)
Glucose-Capillary: 89 mg/dL (ref 70–99)

## 2021-06-01 LAB — HEMOGLOBIN AND HEMATOCRIT, BLOOD
HCT: 23.4 % — ABNORMAL LOW (ref 36.0–46.0)
HCT: 24.1 % — ABNORMAL LOW (ref 36.0–46.0)
HCT: 22.7 % — ABNORMAL LOW (ref 36.0–46.0)
Hemoglobin: 7.3 g/dL — ABNORMAL LOW (ref 12.0–15.0)
Hemoglobin: 7.4 g/dL — ABNORMAL LOW (ref 12.0–15.0)
Hemoglobin: 7 g/dL — ABNORMAL LOW (ref 12.0–15.0)

## 2021-06-01 LAB — TRIGLYCERIDES: Triglycerides: 108 mg/dL (ref <150)

## 2021-06-01 LAB — PREALBUMIN: Prealbumin: 7.7 mg/dL — ABNORMAL LOW (ref 18–38)

## 2021-06-01 LAB — PHOSPHORUS: Phosphorus: 2.5 mg/dL (ref 2.5–4.6)

## 2021-06-01 LAB — MAGNESIUM: Magnesium: 1.5 mg/dL — ABNORMAL LOW (ref 1.7–2.4)

## 2021-06-01 SURGERY — ESOPHAGOGASTRODUODENOSCOPY (EGD) WITH PROPOFOL
Anesthesia: Monitor Anesthesia Care

## 2021-06-01 SURGERY — EGD (ESOPHAGOGASTRODUODENOSCOPY)
Anesthesia: Monitor Anesthesia Care

## 2021-06-01 MED ORDER — METHYLPREDNISOLONE SODIUM SUCC 40 MG IJ SOLR
8.0000 mg | Freq: Every day | INTRAMUSCULAR | Status: DC
  Administered 2021-06-11 – 2021-06-13 (×3): 8 mg via INTRAVENOUS
  Filled 2021-06-01 (×3): qty 1

## 2021-06-01 MED ORDER — SODIUM CHLORIDE 0.9 % IV SOLN
INTRAVENOUS | Status: AC | PRN
  Administered 2021-06-01: 250 mL via INTRAVENOUS

## 2021-06-01 MED ORDER — TRAVASOL 10 % IV SOLN
INTRAVENOUS | Status: AC
  Filled 2021-06-01: qty 445.2

## 2021-06-01 MED ORDER — SODIUM CHLORIDE 0.9 % IV SOLN
INTRAVENOUS | Status: AC | PRN
  Administered 2021-06-01: 500 mL via INTRAMUSCULAR

## 2021-06-01 MED ORDER — METHYLPREDNISOLONE SODIUM SUCC 40 MG IJ SOLR
16.0000 mg | Freq: Every day | INTRAMUSCULAR | Status: AC
  Administered 2021-06-06 – 2021-06-10 (×5): 16 mg via INTRAVENOUS
  Filled 2021-06-01 (×5): qty 1

## 2021-06-01 MED ORDER — MAGNESIUM SULFATE 2 GM/50ML IV SOLN
2.0000 g | Freq: Once | INTRAVENOUS | Status: AC
  Administered 2021-06-01: 2 g via INTRAVENOUS
  Filled 2021-06-01: qty 50

## 2021-06-01 MED ORDER — LIDOCAINE HCL (PF) 1 % IJ SOLN
INTRAMUSCULAR | Status: AC
  Filled 2021-06-01: qty 30

## 2021-06-01 MED ORDER — PROPOFOL 500 MG/50ML IV EMUL
INTRAVENOUS | Status: DC | PRN
  Administered 2021-06-01: 75 ug/kg/min via INTRAVENOUS

## 2021-06-01 MED ORDER — SODIUM PHOSPHATES 45 MMOLE/15ML IV SOLN
10.0000 mmol | Freq: Once | INTRAVENOUS | Status: AC
  Administered 2021-06-01: 10 mmol via INTRAVENOUS
  Filled 2021-06-01: qty 3.33

## 2021-06-01 MED ORDER — FENTANYL CITRATE (PF) 100 MCG/2ML IJ SOLN
INTRAMUSCULAR | Status: AC | PRN
  Administered 2021-06-01: 25 ug via INTRAVENOUS

## 2021-06-01 MED ORDER — SODIUM CHLORIDE 0.9% FLUSH
10.0000 mL | Freq: Two times a day (BID) | INTRAVENOUS | Status: DC
  Administered 2021-06-01 – 2021-06-07 (×12): 10 mL
  Administered 2021-06-08: 20 mL
  Administered 2021-06-08 – 2021-06-17 (×19): 10 mL

## 2021-06-01 MED ORDER — METHYLPREDNISOLONE SODIUM SUCC 40 MG IJ SOLR
24.0000 mg | Freq: Every day | INTRAMUSCULAR | Status: AC
  Administered 2021-06-01 – 2021-06-05 (×5): 24 mg via INTRAVENOUS
  Filled 2021-06-01 (×5): qty 1

## 2021-06-01 MED ORDER — INSULIN ASPART 100 UNIT/ML IJ SOLN
0.0000 [IU] | INTRAMUSCULAR | Status: DC
  Administered 2021-06-01: 7 [IU] via SUBCUTANEOUS
  Administered 2021-06-01: 3 [IU] via SUBCUTANEOUS
  Administered 2021-06-02: 15 [IU] via SUBCUTANEOUS
  Administered 2021-06-02 (×2): 7 [IU] via SUBCUTANEOUS
  Administered 2021-06-02: 11 [IU] via SUBCUTANEOUS
  Administered 2021-06-02: 15 [IU] via SUBCUTANEOUS
  Administered 2021-06-02: 7 [IU] via SUBCUTANEOUS
  Administered 2021-06-03: 11 [IU] via SUBCUTANEOUS
  Administered 2021-06-03: 4 [IU] via SUBCUTANEOUS
  Administered 2021-06-03: 11 [IU] via SUBCUTANEOUS
  Administered 2021-06-03 – 2021-06-04 (×3): 7 [IU] via SUBCUTANEOUS
  Administered 2021-06-04 (×2): 3 [IU] via SUBCUTANEOUS
  Administered 2021-06-04: 11 [IU] via SUBCUTANEOUS
  Administered 2021-06-04: 4 [IU] via SUBCUTANEOUS
  Administered 2021-06-05: 11 [IU] via SUBCUTANEOUS
  Administered 2021-06-05: 15 [IU] via SUBCUTANEOUS
  Administered 2021-06-05: 3 [IU] via SUBCUTANEOUS
  Administered 2021-06-05: 4 [IU] via SUBCUTANEOUS
  Administered 2021-06-05: 3 [IU] via SUBCUTANEOUS
  Administered 2021-06-06: 11 [IU] via SUBCUTANEOUS
  Administered 2021-06-06: 7 [IU] via SUBCUTANEOUS
  Administered 2021-06-06: 4 [IU] via SUBCUTANEOUS
  Administered 2021-06-06: 3 [IU] via SUBCUTANEOUS
  Administered 2021-06-06: 4 [IU] via SUBCUTANEOUS
  Administered 2021-06-07 (×2): 3 [IU] via SUBCUTANEOUS
  Administered 2021-06-07: 4 [IU] via SUBCUTANEOUS
  Administered 2021-06-07 (×2): 7 [IU] via SUBCUTANEOUS
  Administered 2021-06-07: 4 [IU] via SUBCUTANEOUS
  Administered 2021-06-07: 7 [IU] via SUBCUTANEOUS
  Administered 2021-06-08 (×2): 3 [IU] via SUBCUTANEOUS

## 2021-06-01 MED ORDER — MIDAZOLAM HCL 2 MG/2ML IJ SOLN
INTRAMUSCULAR | Status: AC
  Filled 2021-06-01: qty 2

## 2021-06-01 MED ORDER — LIDOCAINE HCL (PF) 1 % IJ SOLN
INTRAMUSCULAR | Status: AC | PRN
  Administered 2021-06-01: 10 mL

## 2021-06-01 MED ORDER — MIDAZOLAM HCL 2 MG/2ML IJ SOLN
INTRAMUSCULAR | Status: AC | PRN
  Administered 2021-06-01: 1 mg via INTRAVENOUS

## 2021-06-01 MED ORDER — SODIUM CHLORIDE 0.9% FLUSH: 10.0000 mL | INTRAVENOUS | Status: DC | PRN

## 2021-06-01 MED ORDER — CHLORHEXIDINE GLUCONATE CLOTH 2 % EX PADS
6.0000 | MEDICATED_PAD | Freq: Every day | CUTANEOUS | Status: DC
  Administered 2021-06-02 – 2021-06-18 (×16): 6 via TOPICAL

## 2021-06-01 MED ORDER — FENTANYL CITRATE (PF) 100 MCG/2ML IJ SOLN
INTRAMUSCULAR | Status: AC
  Filled 2021-06-01: qty 2

## 2021-06-01 SURGICAL SUPPLY — 15 items

## 2021-06-01 NOTE — Progress Notes (Signed)
Okay to place PICC per Infectious Disease Dr. Earlene Plater.

## 2021-06-01 NOTE — Anesthesia Preprocedure Evaluation (Addendum)
Anesthesia Evaluation  Patient identified by MRN, date of birth, ID band Patient awake    Reviewed: Allergy & Precautions, NPO status , Patient's Chart, lab work & pertinent test results, reviewed documented beta blocker date and time   Airway Mallampati: III  TM Distance: >3 FB Neck ROM: Full    Dental no notable dental hx. (+) Teeth Intact, Dental Advisory Given   Pulmonary asthma , sleep apnea , COPD, PE   Pulmonary exam normal breath sounds clear to auscultation       Cardiovascular hypertension, Pt. on medications and Pt. on home beta blockers +CHF  Normal cardiovascular exam Rhythm:Regular Rate:Normal  TTE 2022 1. Left ventricular ejection fraction, by estimation, is 65 to 70%. The  left ventricle has normal function. The left ventricle has no regional  wall motion abnormalities. There is severe concentric left ventricular hypertrophy. Diastolic function  indeterminant due to severe MAC.  2. RV incompletely visualized. Based on available views, RV basal systolic function appears relatively preserved. The mid-to-apcial RV free wall appears mildly hypokinetic. The right ventricular size is mildly  enlarged. There is severely elevated  pulmonary artery systolic pressure. The estimated right ventricular  systolic pressure is 65 mmHg.  3. The mitral valve is degenerative. Trivial mitral valve regurgitation. No evidence of mitral stenosis. Severe mitral annular calcification.  4. The aortic valve is tricuspid. There is mild calcification of the  aortic valve. There is mild thickening of the aortic valve. Aortic valve  regurgitation is mild. Mild to moderate aortic valve  sclerosis/calcification is present, without any evidence  of aortic stenosis.  5. Aortic dilatation noted. There is mild dilatation of the ascending  aorta, measuring 41 mm.  6. The inferior vena cava is normal in size with greater than 50%  respiratory  variability, suggesting right atrial pressure of 3 mmHg.  7. Left atrial size was mildly dilated.  8. Severely elevated PASP with mild RV dilation and systolic dysfunction  suggestive of RV strain in the setting of acute PE.   Neuro/Psych  Headaches, Seizures -,  PSYCHIATRIC DISORDERS Depression CVA    GI/Hepatic Neg liver ROS, GERD  Controlled and Medicated,  Endo/Other  diabetes, Oral Hypoglycemic AgentsHypothyroidism Morbid obesity (BMI 44)  Renal/GU negative Renal ROS  negative genitourinary   Musculoskeletal negative musculoskeletal ROS (+)   Abdominal   Peds  Hematology  (+) Blood dyscrasia, anemia ,   Anesthesia Other Findings EGD for melena, Hgb 7.3  Presented to the hospital on 05/22/21 with a 2-week history of abdominal pain nausea vomiting and diarrhea and was noted to have an mid-sigmoid colon abscess, noted on the CT of the abdomen pelvis (measuring 9.2 cm x 5.8 cm which could not be drained by IR there was no favorable window available and it was a complex abscess anterior to the uterus and in between loops of distal small bowel) secondary to perforation associated with sigmoid diverticulitis and large PE's laterally (with features of right heart strain) for which she was started on IV Heparin now off heparin 2/2 GIB  Reproductive/Obstetrics                            Anesthesia Physical Anesthesia Plan  ASA: 4  Anesthesia Plan: MAC   Post-op Pain Management:    Induction: Intravenous  PONV Risk Score and Plan: 2 and Propofol infusion and Treatment may vary due to age or medical condition  Airway Management Planned: Natural Airway  Additional  Equipment:   Intra-op Plan:   Post-operative Plan:   Informed Consent: I have reviewed the patients History and Physical, chart, labs and discussed the procedure including the risks, benefits and alternatives for the proposed anesthesia with the patient or authorized representative who has  indicated his/her understanding and acceptance.     Dental advisory given  Plan Discussed with: CRNA  Anesthesia Plan Comments:         Anesthesia Quick Evaluation

## 2021-06-01 NOTE — Progress Notes (Signed)
PROGRESS NOTE  [name]  WGN:[id number] DOB: [birth year] DOA: 05/26/2021 PCP: Ollen Bowl, MD   Brief Narrative: [name] is a 70 y.o. female with a history of chronic HFpEF, HTN, OSA, T2DM, morbid obesity, and on prednisone taper for Graves' disease who presented initially 6/10 with abdominal pain found to have perforated diverticulitis and abscess on CT which also incidentally noted bilateral lower and central PE's with flattening LV for which heparin was started. Subsequent echo was limited due to habitus but showed mild RV dilation and systolic dysfunction suggestive of RV strain with severely elevated PASP ( ). Surgery has followed, though not taken to OR yet as patient is high risk for complications. There was no window for abscess drain per IR. Repeat CT is planned.   On 6/15, the patient developed melena with dropping hemoglobin for which anticoagulation was stopped and GI consulted, planning EGD 6/16.   Assessment & Plan: Principal Problem:   Diverticulitis of colon with perforation Active Problems:   Chronic diastolic CHF (congestive heart failure) (HCC)   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 (HCC)   Moderate persistent asthma, uncomplicated   Seizure (HCC)   Bilateral pulmonary embolism (HCC)  Acute blood loss anemia due to suspected upper GI bleed provoked by anticoagulation:  - Hgb 12.3 last week, 11.7 on admission, now down to 7.2. Recheck this AM w/type & screen is stable at 7.3 since stopping heparin. If she becomes symptomatic or hypotensive, she has consented to receiving blood products. - EGD today per Dr. Loreta Ave.  - Hold heparin.  - Continue serial H/H  Acute hypoxic respiratory failure due to bilateral PE with RV strain, pulmonary HTN:  - Continue supplemental oxygen if needed.  - Previously discussed with CCM who recommended against lytics. Will restart heparin ASAP, hopefully once source of upper GI bleeding is treated.  -  LLE with proximal (popliteal) and distal (PT vein) DVTs. If unable to initiate anticoagulation, will consult IR for IVC filter.  Perforated diverticulitis with abscess: Abd exam fortunately without peritonitis. - Continue zosyn per ID. No blood cultures drawn. WBC trending up, afebrile. - D/w general surgery at bedside. Will plan rescanning to assess abscess to inform decision regarding repeat attempt at drain vs. surgery.  Uncontrolled T2DM:  - Continue lantus 15u daily and q4h SSI.   Hypothyroidism, Graves' disease: Exophthalmos noted. - Continue steroid taper for this indication (does continue putting patient at risk of stress ulcer which is receiving SUP and steroid-induced hyperglycemia). D/w pharmacy, will provide this via IV for now.   Chronic HFpEF: Appears euvolemic at this time. Severe concentric LVH on echo. LVEF 65-70% - Monitor volume status closely  Stage IIIa CKD: CrCl 106ml/min currently.  Thrombocytopenia: Resolved.  Morbid obesity: Estimated body mass index is 43.99 kg/m as calculated from the following:   Height as of this encounter:  (1.575 m).   Weight as of this encounter: 109.1 kg.  DVT prophylaxis: Off heparin currently Code Status: Full Family Communication: Daughter at bedside Disposition Plan:  Status is: Inpatient  Remains inpatient appropriate because:Ongoing diagnostic testing needed not appropriate for outpatient work up, IV treatments appropriate due to intensity of illness or inability to take PO, and Inpatient level of care appropriate due to severity of illness  Dispo: The patient is from: Home              Anticipated d/c is to:  TBD              Patient currently  is not medically stable to d/c.   Difficult to place patient No  Consultants:  PCCM GI General surgery ID  Procedures:  EGD 06/01/2021 Dr. Loreta Ave pending  Antimicrobials: Cefepime, flagyl 6/10 Zosyn 6/10 >>   Subjective: Pt denies chest pain or dyspnea. Abd pain stable.  Still having dark loose stools through flexiseal last night. None this AM reportedly.  Objective: Vitals:   05/31/21 2324 06/01/21 0324 06/01/21 0700 06/01/21 1100  BP: 92/66 120/87 111/80 (!) 95/43  Pulse: 96 (!) 107    Resp: Temp: 97.6 F (36.4 C) (!) 97.5 F (36.4 C) 98.1 F (36.7 C) 97.6 F (36.4 C)  TempSrc: Oral Oral Oral Oral  SpO2: 100% 99% 99%   Weight:      Height:       No intake or output data in the 24 hours ending 06/01/21 1142 Filed Weights   05/27/21 0041  Weight: 109.1 kg    Gen: 70 y.o. female in no distress Pulm: Non-labored breathing supplemental oxygen. Clear to auscultation bilaterally. No wheezes. CV: Regular rate and rhythm. No murmur, rub, or gallop. LLE > RLE w/pitting edema, both with hyperpigmentation. GI: Abdomen soft, non-tender, non-distended, with normoactive bowel sounds. No organomegaly or masses felt. Ext: Warm, no deformities Skin: No other rashes, lesions or ulcers. Appears pale. Neuro: Alert and oriented. No focal neurological deficits. Psych: Judgement and insight appear normal. Mood & affect appropriate.   Data Reviewed: I have personally reviewed following labs and imaging studies  CBC: Recent Labs  Lab 05/26/21 1601 05/27/21 0327 05/28/21 0031 05/29/21 0027 05/30/21 0208 05/30/21 1736 05/31/21 0221 05/31/21 1103 05/31/21 1651 06/01/21 0503 06/01/21 0912  WBC 8.2   < > 10.1 9.1 9.6  --  10.7*  --   --  12.9*  --   NEUTROABS 7.5  --   --   --   --   --   --   --   --  9.8*  --   HGB 13.1   < > 10.9* 10.6* 11.0*   < > 9.1* 8.3* 8.0* 7.2* 7.3*  HCT 41.0   < > 33.7* 33.3* 33.8*   < > 27.9* 26.1* 24.1* 22.5* 23.4*  MCV 97.2   < > 96.3 96.8 96.0  --  95.9  --   --  96.6  --   PLT PLATELET CLUMPS NOTED ON SMEAR, UNABLE TO ESTIMATE   < > 84* 88* 111*  --  152  --   --  154  --    < > = values in this interval not displayed.   Basic Metabolic Panel: Recent Labs  Lab 05/26/21 1746 05/27/21 0327 05/28/21 0945  05/29/21 1631 05/30/21 0208 05/31/21 0221 06/01/21 0503  NA  --    < > 139 136 135 135 139  K  --    < > 2.8* 3.5 3.4* 5.1 4.2  CL  --    < > 106 102 103 103 107  CO2  --    < > GLUCOSE  --    < > 137* 195* 230* 310* 107*  BUN  --    < > 25* 30*  CREATININE  --    < > 1.23* 1.02* 0.96 0.86 1.16*  CALCIUM  --    < > 8.1* 8.0* 8.0* 7.9* 8.3*  MG 1.4*  --  1.7  --   --   --  1.5*  PHOS  --   --   --   --   --   --  2.5   < > = values in this interval not displayed.   GFR: Estimated Creatinine Clearance: 52.5 mL/min (A) (by C-G formula based on SCr of 1.16 mg/dL (H)). Liver Function Tests: Recent Labs  Lab 05/26/21 1601 05/27/21 0327 06/01/21 0503  AST 18 13* 16  ALT 26 23 19   ALKPHOS 61 52 99  BILITOT 1.2 1.0 0.7  PROT 5.5* 4.4* 4.1*  ALBUMIN 2.8* 2.2* 1.7*   Recent Labs  Lab 05/26/21 1601  LIPASE 19   No results for input(s): AMMONIA in the last 168 hours. Coagulation Profile: No results for input(s): INR, PROTIME in the last 168 hours. Cardiac Enzymes: No results for input(s): CKTOTAL, CKMB, CKMBINDEX, TROPONINI in the last 168 hours. BNP (last 3 results) No results for input(s): PROBNP in the last 8760 hours. HbA1C: No results for input(s): HGBA1C in the last 72 hours. CBG: Recent Labs  Lab 05/31/21 2005 05/31/21 2321 06/01/21 0402 06/01/21 0820 06/01/21 1106  GLUCAP 86 92 89 107* 126*   Lipid Profile: Recent Labs    06/01/21 0503  TRIG 108   Thyroid Function Tests: No results for input(s): TSH, T4TOTAL, FREET4, T3FREE, THYROIDAB in the last 72 hours. Anemia Panel: No results for input(s): VITAMINB12, FOLATE, FERRITIN, TIBC, IRON, RETICCTPCT in the last 72 hours. Urine analysis:    Component Value Date/Time   COLORURINE YELLOW 05/26/2021 2115   APPEARANCEUR CLEAR 05/26/2021 2115   LABSPEC 1.017 05/26/2021 2115   PHURINE 5.0 05/26/2021 2115   GLUCOSEU 50 (A) 05/26/2021 2115   GLUCOSEU NEGATIVE 07/13/2020 0900   HGBUR  SMALL (A) 05/26/2021 2115   BILIRUBINUR NEGATIVE 05/26/2021 2115   KETONESUR NEGATIVE 05/26/2021 2115   PROTEINUR NEGATIVE 05/26/2021 2115   UROBILINOGEN 0.2 07/13/2020 0900   NITRITE NEGATIVE 05/26/2021 2115   LEUKOCYTESUR TRACE (A) 05/26/2021 2115   Recent Results (from the past 240 hour(s))  Urine culture     Status: Abnormal   Collection Time: 05/23/21  7:44 PM   Specimen: Urine, Random  Result Value Ref Range Status   Specimen Description   Final    URINE, RANDOM Performed at [facility], [address] [place], [place]    Special Requests   Final    NONE Performed at [facility], [address] [place], [place]    Culture MULTIPLE SPECIES PRESENT, SUGGEST RECOLLECTION (A)  Final   Report Status 05/25/2021 FINAL  Final  Gastrointestinal Panel by PCR , Stool     Status: None   Collection Time: 05/26/21  4:59 PM   Specimen: Per Rectum; Stool  Result Value Ref Range Status   Campylobacter species NOT DETECTED NOT DETECTED Final   Plesimonas shigelloides NOT DETECTED NOT DETECTED Final   Salmonella species NOT DETECTED NOT DETECTED Final   Yersinia enterocolitica NOT DETECTED NOT DETECTED Final   Vibrio species NOT DETECTED NOT DETECTED Final   Vibrio cholerae NOT DETECTED NOT DETECTED Final   Enteroaggregative E coli (EAEC) NOT DETECTED NOT DETECTED Final   Enteropathogenic E coli (EPEC) NOT DETECTED NOT DETECTED Final   Enterotoxigenic E coli (ETEC) NOT DETECTED NOT DETECTED Final   Shiga like toxin producing E coli (STEC) NOT DETECTED NOT DETECTED Final   Shigella/Enteroinvasive E coli (EIEC) NOT DETECTED NOT DETECTED Final   Cryptosporidium NOT DETECTED NOT DETECTED Final   Cyclospora cayetanensis NOT DETECTED NOT DETECTED Final  Entamoeba histolytica NOT DETECTED NOT DETECTED Final   Giardia lamblia NOT DETECTED NOT DETECTED Final   Adenovirus F40/41 NOT DETECTED NOT DETECTED Final   Astrovirus NOT  DETECTED NOT DETECTED Final   Norovirus GI/GII NOT DETECTED NOT DETECTED Final   Rotavirus A NOT DETECTED NOT DETECTED Final   Sapovirus (I, II, IV, and V) NOT DETECTED NOT DETECTED Final    Comment: Performed at [facility], [address], [place], [place] [address]  Resp Panel by RT-PCR (Flu A&B, Covid) Nasopharyngeal Swab     Status: None   Collection Time: 05/26/21  5:19 PM   Specimen: Nasopharyngeal Swab; Nasopharyngeal(NP) swabs in vial transport medium  Result Value Ref Range Status   SARS Coronavirus 2 by RT PCR NEGATIVE NEGATIVE Final    Comment: (NOTE) SARS-CoV-2 target nucleic acids are NOT DETECTED.  The SARS-CoV-2 RNA is generally detectable in upper respiratory specimens during the acute phase of infection. The lowest concentration of SARS-CoV-2 viral copies this assay can detect is 138 copies/mL. A negative result does not preclude SARS-Cov-2 infection and should not be used as the sole basis for treatment or other patient management decisions. A negative result may occur with  improper specimen collection/handling, submission of specimen other than nasopharyngeal swab, presence of viral mutation(s) within the areas targeted by this assay, and inadequate number of viral copies(<138 copies/mL). A negative result must be combined with clinical observations, patient history, and epidemiological information. The expected result is Negative.  Fact Sheet for Patients:  BloggerCourse.com  Fact Sheet for Healthcare Providers:  SeriousBroker.it  This test is no t yet approved or cleared by the [place] FDA and  has been authorized for detection and/or diagnosis of SARS-CoV-2 by FDA under an Emergency Use Authorization (EUA). This EUA will remain  in effect (meaning this test can be used) for the duration of the COVID-19 declaration under Section 564(b)(1) of the Act, 21 U.S.C.section 360bbb-3(b)(1), unless  the authorization is terminated  or revoked sooner.       Influenza A by PCR NEGATIVE NEGATIVE Final   Influenza B by PCR NEGATIVE NEGATIVE Final    Comment: (NOTE) The Xpert Xpress SARS-CoV-2/FLU/RSV plus assay is intended as an aid in the diagnosis of influenza from Nasopharyngeal swab specimens and should not be used as a sole basis for treatment. Nasal washings and aspirates are unacceptable for Xpert Xpress SARS-CoV-2/FLU/RSV testing.  Fact Sheet for Patients: BloggerCourse.com  Fact Sheet for Healthcare Providers: SeriousBroker.it  This test is not yet approved or cleared by the [place] FDA and has been authorized for detection and/or diagnosis of SARS-CoV-2 by FDA under an Emergency Use Authorization (EUA). This EUA will remain in effect (meaning this test can be used) for the duration of the COVID-19 declaration under Section 564(b)(1) of the Act, 21 U.S.C. section 360bbb-3(b)(1), unless the authorization is terminated or revoked.  Performed at [facility], [address], [place], [place] [address]   Surgical PCR screen     Status: None   Collection Time: 05/27/21  1:29 AM   Specimen: Nasal Mucosa; Nasal Swab  Result Value Ref Range Status   MRSA, PCR NEGATIVE NEGATIVE Final   Staphylococcus aureus NEGATIVE NEGATIVE Final    Comment: (NOTE) The Xpert SA Assay (FDA approved for NASAL specimens in patients 39 years of age and older), is one component of a comprehensive surveillance program. It is not intended to diagnose infection nor to guide or monitor treatment. Performed at [facility]  [facility], [address] [place], [place] [address]   C Difficile Quick Screen w PCR reflex     Status: None   Collection Time: 05/30/21  4:57 PM   Specimen: Per Rectum; Stool  Result Value Ref Range Status   C Diff antigen NEGATIVE NEGATIVE Final   C Diff toxin NEGATIVE NEGATIVE Final   C Diff interpretation No  C. difficile detected.  Final    Comment: Performed at [facility], [address], [place], [place] [address]      Radiology Studies: VAS [place] LOWER EXTREMITY VENOUS (DVT)  Result Date: 05/31/2021  Lower Venous DVT Study Patient Name:  Aiya VAMPLE Toso  Date of Exam:   05/31/2021 Medical Rec #: [id number]           Accession #:    [id number] Date of Birth: 1951/10/15          Patient Gender: F Patient Age:   69Y Exam Location:  [facility] Procedure:      VAS [place] LOWER EXTREMITY VENOUS (DVT) Referring Phys: [id number] RALPH A NETTEY --------------------------------------------------------------------------------  Indications: Pulmonary embolism.  Risk Factors: Immobility. Limitations: Body habitus, poor ultrasound/tissue interface and Patient positioning. Comparison Study: No previous exams Performing Technologist: Jody Hill RVT, RDMS  Examination Guidelines: A complete evaluation includes B-mode imaging, spectral Doppler, color Doppler, and power Doppler as needed of all accessible portions of each vessel. Bilateral testing is considered an integral part of a complete examination. Limited examinations for reoccurring indications may be performed as noted. The reflux portion of the exam is performed with the patient in reverse Trendelenburg.  +---------+---------------+---------+-----------+----------+-------------------+ RIGHT    CompressibilityPhasicitySpontaneityPropertiesThrombus Aging      +---------+---------------+---------+-----------+----------+-------------------+ CFV      Full           Yes      Yes                                      +---------+---------------+---------+-----------+----------+-------------------+ SFJ      Full                                                             +---------+---------------+---------+-----------+----------+-------------------+ FV Prox  Full           Yes      Yes                                       +---------+---------------+---------+-----------+----------+-------------------+ FV Mid   Full           Yes      Yes                                      +---------+---------------+---------+-----------+----------+-------------------+ FV DistalFull           Yes      Yes                                      +---------+---------------+---------+-----------+----------+-------------------+ PFV  Full                                                             +---------+---------------+---------+-----------+----------+-------------------+ POP                     Yes      Yes                  Patent by color and                                                       doppler             +---------+---------------+---------+-----------+----------+-------------------+ PTV      Full                                                             +---------+---------------+---------+-----------+----------+-------------------+ PERO                                                  not visualized on                                                         this exam           +---------+---------------+---------+-----------+----------+-------------------+   Right Technical Findings: Right leg not evaluated. Not visualized segments include peroneal veins.  +---------+---------------+---------+-----------+----------+-------------------+ LEFT     CompressibilityPhasicitySpontaneityPropertiesThrombus Aging      +---------+---------------+---------+-----------+----------+-------------------+ CFV      Full           Yes      Yes                                      +---------+---------------+---------+-----------+----------+-------------------+ SFJ      Full                                                             +---------+---------------+---------+-----------+----------+-------------------+ FV Prox  Full           Yes      Yes                                       +---------+---------------+---------+-----------+----------+-------------------+ FV Mid   Full  Yes      Yes                                      +---------+---------------+---------+-----------+----------+-------------------+ FV DistalFull           Yes      Yes                                      +---------+---------------+---------+-----------+----------+-------------------+ PFV      Full                                                             +---------+---------------+---------+-----------+----------+-------------------+ POP      None           No       No                   Acute               +---------+---------------+---------+-----------+----------+-------------------+ PTV      None           No       No                   Acute               +---------+---------------+---------+-----------+----------+-------------------+ PERO                                                  not visualized on                                                         this exam           +---------+---------------+---------+-----------+----------+-------------------+   Left Technical Findings: Left leg not evaluated. Not visualized segments include peroneal veins.   Summary: BILATERAL: - No evidence of superficial venous thrombosis in the lower extremities, bilaterally. -No evidence of popliteal cyst, bilaterally. RIGHT: - There is no evidence of deep vein thrombosis in the lower extremity.  LEFT: - Findings consistent with acute deep vein thrombosis involving the left popliteal vein, and left posterior tibial veins.  *See table(s) above for measurements and observations. Electronically signed by Coral ElseVance Brabham MD on 05/31/2021 at 7:48:20 PM.    Final    US EKG SITE RITE  Result Date: 05/31/2021 If Site Rite image not attached, placement could not be confirmed due to current cardiac rhythm.   Scheduled Meds:  insulin aspart  0-20 Units  Subcutaneous Q4H   insulin glargine  15 Units Subcutaneous Daily   levothyroxine  112 mcg Oral Once per day on Mon Tue Wed Thu Fri Sat   And   [START ON 06/04/2021] levothyroxine  56 mcg Oral Once per day on Sun   methylPREDNISolone (SOLU-MEDROL) injection  24 mg Intravenous Daily  Followed by   Melene Muller ON 06/06/2021] methylPREDNISolone (SOLU-MEDROL) injection  16 mg Intravenous Daily   Followed by   Melene Muller ON 06/11/2021] methylPREDNISolone (SOLU-MEDROL) injection  8 mg Intravenous Daily   pantoprazole  40 mg Oral BID   rosuvastatin  5 mg Oral Daily   Continuous Infusions:  magnesium sulfate bolus IVPB 2 g (06/01/21 1103)   piperacillin-tazobactam (ZOSYN)  IV 3.375 g (06/01/21 0611)   sodium phosphate  Dextrose 5% IVPB     TPN ADULT (ION)       LOS: 6 days   Time spent: 35 minutes.  Tyrone Nine, MD [facility] [email or web address] 06/01/2021, 11:42 AM

## 2021-06-01 NOTE — Progress Notes (Signed)
Inpatient Rehab Admissions Coordinator:   Consult received.  Attempted to meet pt at bedside x2 but off unit for procedure.  Will f/u with pt tomorrow.  Note possible surgical plans so may not be medically ready for some time.   Estill Dooms, PT, DPT Admissions Coordinator [phone] 06/01/21  4:28 PM

## 2021-06-01 NOTE — Evaluation (Signed)
Occupational Therapy Evaluation Patient Details Name: [name] MRN: [id number] DOB: 13-Oct-1951 Today's Date: 06/01/2021    History of Present Illness 70 y.o. female admitted on 05/26/21 for sigmoid diverticulitis with perforation and abscess, IR consulted for surgery vs drain placement and had an unsucessful attempt on 05/28/21.  Bil PE started on IV heparin.  PMH including anemia, CHF, DM, graves disease, HTN, obesity, stroke, seizure, and thyroid disease.   Clinical Impression   PTA, pt was living alone and was performing ADLs and IADLs; granddaughter has been staying at dc. Difficulty deciphering when weakness started and her limitations at PLOF due to cognitive deficits and inconsistent information.  Pt currently requiring Max A for UB ADLs, Total A for LB ADLs, and Mod A +2 for functional transfers. Pt presenting with decreased cognition, balance, strength, and activity tolerance. Pt would benefit from further acute OT to facilitate safe dc. Recommend dc to SNF for further OT to optimize safety, independence with ADLs, and return to PLOF.     Follow Up Recommendations  SNF    Equipment Recommendations  None recommended by OT    Recommendations for Other Services PT consult     Precautions / Restrictions Precautions Precautions: Fall      Mobility Bed Mobility Overal bed mobility: Needs Assistance Bed Mobility: Supine to Sit;Sit to Supine     Supine to sit: Min assist;+2 for physical assistance;HOB elevated Sit to supine: Mod assist;+2 for physical assistance   General bed mobility comments: Min A for intiating bringing BLEs towards EOB and then elevating trunk. Mod A +2 to lower trunk adn bring BLEs over EOB in return to supine    Transfers Overall transfer level: Needs assistance Equipment used: Rolling walker (2 wheeled);2 person hand held assist Transfers: Sit to/from Stand Sit to Stand: Mod assist;+2 physical assistance         General transfer  comment: Mod A +2 to power up into standing. Fatigues quickly    Balance Overall balance assessment: Needs assistance Sitting-balance support: No upper extremity supported;Feet supported Sitting balance-Leahy Scale: Fair     Standing balance support: Bilateral upper extremity supported;During functional activity Standing balance-Leahy Scale: Poor Standing balance comment: reliant on support in standing.                           ADL either performed or assessed with clinical judgement   ADL Overall ADL's : Needs assistance/impaired Eating/Feeding: NPO   Grooming: Wash/dry face;Set up;Supervision/safety;Bed level   Upper Body Bathing: Maximal assistance;Sitting   Lower Body Bathing: Total assistance;Sit to/from stand   Upper Body Dressing : Maximal assistance;Sitting   Lower Body Dressing: Total assistance;Sit to/from stand   Toilet Transfer: Moderate assistance;+2 for physical assistance (sit<>stand only)           Functional mobility during ADLs: Moderate assistance;+2 for physical assistance (sit<>stand only) General ADL Comments: Pt presenting with decreased balance, strength, cognition ,and activity tolerance     Vision         Perception     Praxis      Pertinent Vitals/Pain Pain Assessment: Faces Faces Pain Scale: Hurts whole lot Pain Location: Generalized Pain Descriptors / Indicators: Guarding;Grimacing Pain Intervention(s): Monitored during session;Repositioned     Hand Dominance Right   Extremity/Trunk Assessment Upper Extremity Assessment Upper Extremity Assessment: Generalized weakness   Lower Extremity Assessment Lower Extremity Assessment: Generalized weakness   Cervical / Trunk Assessment Cervical / Trunk Assessment: Other exceptions Cervical /  Trunk Exceptions: Forward round of shoulder. increasecd body habitus   Communication Communication Communication: No difficulties   Cognition Arousal/Alertness: Lethargic Behavior  During Therapy: Flat affect Overall Cognitive Status: Impaired/Different from baseline Area of Impairment: Attention;Memory;Following commands;Safety/judgement;Awareness;Problem solving                   Current Attention Level: Sustained Memory: Decreased short-term memory Following Commands: Follows one step commands inconsistently;Follows one step commands with increased time Safety/Judgement: Decreased awareness of safety;Decreased awareness of deficits Awareness: Intellectual Problem Solving: Difficulty sequencing;Requires verbal cues;Requires tactile cues General Comments: Oriented to place, time, and sitation (vague with situation). Slow to respond. easily distracted and then forgetful with external noise and distractions. Decreased awarenees of her abilities and stating "I can't" thorughout. Repeating questions about who therapists' are despite already having been introduced   General Comments  BP: 83/67 (74) sitting in bed in chair position, 90/69 (77) following leg lifts, 101/69 (79) sitting in bed with HOB elevated after arm and leg AROM, 69/39 (50) EOB, 89/72 (79) after 2 minutes EOB, 97/70 (80) supine. HR 100-110s (max 123). SpO2 >95 on RA.    Exercises Exercises: General Upper Extremity General Exercises - Upper Extremity Shoulder Flexion: AAROM;Both;5 reps;Supine Shoulder Extension: AAROM;Both;5 reps;Supine Elbow Flexion: AROM;Both;10 reps;Supine Elbow Extension: AROM;Both;10 reps;Seated   Shoulder Instructions      Home Living Family/patient expects to be discharged to:: Private residence Living Arrangements: Alone Available Help at Discharge: Family;Available PRN/intermittently ("Granddaughter comes to stay with me" Granddaughter works during day) Type of Home: Apartment Home Access: Level entry     Home Layout: One level               Home Equipment: Environmental consultant - 2 wheels;Cane - single point          Prior Functioning/Environment Level of  Independence: Needs assistance  Gait / Transfers Assistance Needed: Pt at her normal walks with a cane, drives, manages her own medications (however, she has a very significant fear of needles and family has been helping inject her to ADL's / Homemaking Assistance Needed: per pt report she was recently independent, does not drive but can drive.            OT Problem List: Decreased strength;Decreased range of motion;Decreased activity tolerance;Impaired balance (sitting and/or standing);Decreased knowledge of use of DME or AE;Decreased cognition;Decreased safety awareness;Decreased knowledge of precautions;Pain;Obesity;Impaired UE functional use      OT Treatment/Interventions: Self-care/ADL training;Therapeutic exercise;Energy conservation;DME and/or AE instruction;Therapeutic activities;Patient/family education    OT Goals(Current goals can be found in the care plan section) Acute Rehab OT Goals Patient Stated Goal: to figure out what is wrong with her OT Goal Formulation: With patient Time For Goal Achievement: 06/15/21 Potential to Achieve Goals: Good  OT Frequency: Min 2X/week   Barriers to D/C:            Co-evaluation PT/OT/SLP Co-Evaluation/Treatment: Yes Reason for Co-Treatment: For patient/therapist safety;To address functional/ADL transfers   OT goals addressed during session: ADL's and self-care      AM-PAC OT "6 Clicks" Daily Activity     Outcome Measure Help from another person eating meals?: Total Help from another person taking care of personal grooming?: A Little Help from another person toileting, which includes using toliet, bedpan, or urinal?: Total Help from another person bathing (including washing, rinsing, drying)?: A Lot Help from another person to put on and taking off regular upper body clothing?: A Lot Help from another person to put on and taking off  regular lower body clothing?: Total 6 Click Score: 10   End of Session Equipment Utilized During  Treatment: Gait belt Nurse Communication: Mobility status  Activity Tolerance: Patient tolerated treatment well Patient left: in bed;with call bell/phone within reach;with bed alarm set  OT Visit Diagnosis: Unsteadiness on feet (R26.81);Other abnormalities of gait and mobility (R26.89);Muscle weakness (generalized) (M62.81);Pain Pain - part of body:  (Generalized)                Time: 9407-6808 OT Time Calculation (min): 41 min Charges:  OT General Charges $OT Visit: 1 Visit OT Evaluation $OT Eval Moderate Complexity: 1 Mod OT Treatments $Self Care/Home Management : 8-22 mins  Jensyn Cambria MSOT, OTR/L Acute Rehab Pager: [phone] Office: [phone]  Theodoro Grist Eletha Culbertson 06/01/2021, 1:21 PM

## 2021-06-01 NOTE — Progress Notes (Signed)
Referring Physician(s): Grunz,R  Supervising Physician: Mir, Biochemist, clinical  Patient Status:  [name] - In-pt  Chief Complaint: Blood in stool, DVT/PE   Subjective: Patient familiar to IR service from attempted CT-guided drainage of pelvic abscess but ultimately not performed secondary to no safe window on 05/28/2021.  She is a 70 year old [name] with history of heart failure, chronic kidney disease, hypertension, sleep apnea, diabetes, morbid obesity and on prednisone taper for Graves' disease who presented to [facility] on 05/26/21 with abdominal pain and found to have perforated diverticulitis with abscess in addition to incidentally noted bilateral lower and central PEs with flattening LV for which heparin was started.  She also underwent venous Dopplers of her lower extremities yesterday revealing acute DVT involving left popliteal and posterior tibial veins.  On 6/15 she developed melena with drop in hemoglobin for which anticoagulation was stopped and GI consulted.  EGD today revealed normal esophagus, erythematous mucosa in the antrum, duodenitis but no evidence of active bleeding.  Latest labs include WBC 12.9, hemoglobin 7.3 (11 on 6/14), platelets 154k, creatinine 1.16.  Request now received from primary care team for IVC filter placement.  Patient currently denies fever, headache, worsening chest pain, worsening dyspnea, cough, abdominal/back pain, nausea, vomiting.  Past Medical History:  Diagnosis Date   Anemia    Celiac disease    CHF (congestive heart failure) ([place])    Depression    Diabetes mellitus without complication ([place])    Edema    lower extremities   Emphysema of lung ([place])    Enlarged heart    begining stage   Graves disease    Headache(784.0) 05/18/2013   Heart murmur    Hematochezia    Hemorrhoid    Hyperlipidemia    Hypertension    Migraine    Obese    OSA (obstructive sleep apnea)    Seizures ([place])    Sleep apnea    Stroke ([place])    left P-O ICH 2007    Thyroid disease    hypothyroidism   Ulcer    Vitamin D deficiency    Past Surgical History:  Procedure Laterality Date   CESAREAN SECTION     HEMORRHOID SURGERY     IUD REMOVAL     TONSILLECTOMY     VEIN SURGERY        Allergies: Gluten meal  Medications: Prior to Admission medications   Medication Sig Start Date End Date Taking? Authorizing Provider  albuterol (VENTOLIN HFA) 108 (90 Base) MCG/ACT inhaler Inhale 2 puffs into the lungs every 6 (six) hours as needed for wheezing or shortness of breath.   Yes [provider]  amLODipine (NORVASC) 5 MG tablet Take 1 tablet (5 mg total) by mouth daily. 10/04/20  Yes Nahser, Wonda Cheng, MD  carvedilol (COREG) 25 MG tablet Take 1 tablet (25 mg total) by mouth 2 (two) times daily with a meal. 12/05/12  Yes Nahser, Wonda Cheng, MD  esomeprazole (NEXIUM) 20 MG capsule Take 1 capsule by mouth daily.   Yes [provider]  furosemide (LASIX) 20 MG tablet Take 1 tablet (20 mg total) by mouth daily. 05/22/21  Yes Hans Eden, NP  levothyroxine (SYNTHROID) 112 MCG tablet Take 1 tablet by mouth Monday through Saturday and 1/2 tablet on Sunday. 11/08/20  Yes Elayne Snare, MD  metFORMIN (GLUCOPHAGE) 500 MG tablet Take 500 mg by mouth 2 (two) times daily. 08/23/20  Yes [provider]  OVER THE COUNTER MEDICATION Take 1 tablet by mouth  daily. Medication: Vitamin b12 3000 mcg   Yes [provider]  potassium chloride (KLOR-CON) 10 MEQ tablet Take 2 tablets (20 mEq total) by mouth daily. 11/07/20  Yes Nahser, Philip J, MD  predniSONE (DELTASONE) 20 MG tablet Take 60 mg by mouth daily with breakfast.   Yes [provider]  rosuvastatin (CRESTOR) 5 MG tablet Take 1 tablet (5 mg total) by mouth daily. 11/08/20  Yes Nahser, Philip J, MD  Blood Glucose Monitoring Suppl (ONETOUCH VERIO REFLECT) w/Device KIT 1 each by Other route as directed. 08/10/20   [provider]  Fluticasone-Salmeterol (ADVAIR) 250-50  MCG/DOSE AEPB Inhale 1 puff into the lungs daily as needed for allergies.    [provider]  glucose blood (ONETOUCH VERIO) test strip Use as intstructed to check blood sugar once a day Dx Code E11.9 02/18/21   Kumar, Ajay, MD  Lancets (ONETOUCH ULTRASOFT) lancets Use as instructed 10/13/20   Kumar, Ajay, MD  meclizine (ANTIVERT) 25 MG tablet Take 1 tablet by mouth daily as needed for dizziness.    [provider]     Vital Signs: BP 119/75 (BP Location: Right Wrist)   Pulse 91   Temp 98.8 F (37.1 C) (Axillary)   Resp 15   Ht 5' 2" (1.575 m)   Wt 240 lb 8.4 oz (109.1 kg)   SpO2 99%   BMI 43.99 kg/m   Physical Exam patient awake, answers simple questions okay.  Daughter in room.  Chest clear to auscultation bilaterally.  Heart with regular rate and rhythm.  Abdomen soft, obese, positive bowel sounds, currently nontender.  Bilateral lower extremity edema noted.  Imaging: VAS US LOWER EXTREMITY VENOUS (DVT)  Result Date: 05/31/2021  Lower Venous DVT Study Patient Name:  [name]  Date of Exam:   05/31/2021 Medical Rec #: [id number]           Accession #:    [id number] Date of Birth: 06/27/1951          Patient Gender: F Patient Age:   69Y Exam Location:  [facility] Procedure:      VAS US LOWER EXTREMITY VENOUS (DVT) Referring Phys: [id number] RALPH A NETTEY --------------------------------------------------------------------------------  Indications: Pulmonary embolism.  Risk Factors: Immobility. Limitations: Body habitus, poor ultrasound/tissue interface and Patient positioning. Comparison Study: No previous exams Performing Technologist: Jody Hill RVT, RDMS  Examination Guidelines: A complete evaluation includes B-mode imaging, spectral Doppler, color Doppler, and power Doppler as needed of all accessible portions of each vessel. Bilateral testing is considered an integral part of a complete examination. Limited examinations for reoccurring indications may be  performed as noted. The reflux portion of the exam is performed with the patient in reverse Trendelenburg.  +---------+---------------+---------+-----------+----------+-------------------+ RIGHT    CompressibilityPhasicitySpontaneityPropertiesThrombus Aging      +---------+---------------+---------+-----------+----------+-------------------+ CFV      Full           Yes      Yes                                      +---------+---------------+---------+-----------+----------+-------------------+ SFJ      Full                                                             +---------+---------------+---------+-----------+----------+-------------------+   FV Prox  Full           Yes      Yes                                      +---------+---------------+---------+-----------+----------+-------------------+ FV Mid   Full           Yes      Yes                                      +---------+---------------+---------+-----------+----------+-------------------+ FV DistalFull           Yes      Yes                                      +---------+---------------+---------+-----------+----------+-------------------+ PFV      Full                                                             +---------+---------------+---------+-----------+----------+-------------------+ POP                     Yes      Yes                  Patent by color and                                                       doppler             +---------+---------------+---------+-----------+----------+-------------------+ PTV      Full                                                             +---------+---------------+---------+-----------+----------+-------------------+ PERO                                                  not visualized on                                                         this exam            +---------+---------------+---------+-----------+----------+-------------------+   Right Technical Findings: Right leg not evaluated. Not visualized segments include peroneal veins.  +---------+---------------+---------+-----------+----------+-------------------+ LEFT     CompressibilityPhasicitySpontaneityPropertiesThrombus Aging      +---------+---------------+---------+-----------+----------+-------------------+ CFV      Full           Yes        Yes                                      +---------+---------------+---------+-----------+----------+-------------------+ SFJ      Full                                                             +---------+---------------+---------+-----------+----------+-------------------+ FV Prox  Full           Yes      Yes                                      +---------+---------------+---------+-----------+----------+-------------------+ FV Mid   Full           Yes      Yes                                      +---------+---------------+---------+-----------+----------+-------------------+ FV DistalFull           Yes      Yes                                      +---------+---------------+---------+-----------+----------+-------------------+ PFV      Full                                                             +---------+---------------+---------+-----------+----------+-------------------+ POP      None           No       No                   Acute               +---------+---------------+---------+-----------+----------+-------------------+ PTV      None           No       No                   Acute               +---------+---------------+---------+-----------+----------+-------------------+ PERO                                                  not visualized on                                                         this exam            +---------+---------------+---------+-----------+----------+-------------------+   Left Technical Findings: Left leg not   evaluated. Not visualized segments include peroneal veins.   Summary: BILATERAL: - No evidence of superficial venous thrombosis in the lower extremities, bilaterally. -No evidence of popliteal cyst, bilaterally. RIGHT: - There is no evidence of deep vein thrombosis in the lower extremity.  LEFT: - Findings consistent with acute deep vein thrombosis involving the left popliteal vein, and left posterior tibial veins.  *See table(s) above for measurements and observations. Electronically signed by Harold Barban MD on 05/31/2021 at 7:48:20 PM.    Final    [place] EKG SITE RITE  Result Date: 05/31/2021 If Site Rite image not attached, placement could not be confirmed due to current cardiac rhythm.   Labs:  CBC: Recent Labs    05/29/21 0027 05/30/21 0208 05/30/21 1736 05/31/21 0221 05/31/21 1103 05/31/21 1651 06/01/21 0503 06/01/21 0912 06/01/21 1413  WBC 9.1 9.6  --  10.7*  --   --  12.9*  --   --   HGB 10.6* 11.0*   < > 9.1*   < > 8.0* 7.2* 7.3* 7.4*  HCT 33.3* 33.8*   < > 27.9*   < > 24.1* 22.5* 23.4* 24.1*  PLT 88* 111*  --  152  --   --  154  --   --    < > = values in this interval not displayed.    COAGS: No results for input(s): INR, APTT in the last 8760 hours.  BMP: Recent Labs    11/07/20 1058 03/26/21 1927 05/29/21 1631 05/30/21 0208 05/31/21 0221 06/01/21 0503  NA 141   < > 136 135 135 139  K 4.1   < > 3.5 3.4* 5.1 4.2  CL 103   < > 102 103 103 107  CO2 24   < > _0 GLUCOSE 118*   < > 195* 230* 310* 107*  BUN 13   < > 13 16 25* 30*  CALCIUM 9.9   < > 8.0* 8.0* 7.9* 8.3*  CREATININE 1.06*   < > 1.02* 0.96 0.86 1.16*  GFRNONAA 53*   < > 59* >60 >60 51*  GFRAA 61  --   --   --   --   --    < > = values in this interval not displayed.    LIVER FUNCTION TESTS: Recent Labs    05/23/21 2000 05/26/21 1601 05/27/21 0327 06/01/21 0503   BILITOT 1.8* 1.2 1.0 0.7  AST 31 18 13* 16  ALT 34 _1 ALKPHOS 62 61 52 99  PROT 6.3* 5.5* 4.4* 4.1*  ALBUMIN 3.8 2.8* 2.2* 1.7*    Assessment and Plan: Patient familiar to IR service from attempted CT-guided drainage of pelvic abscess but ultimately not performed secondary to no safe window on 05/28/2021.  She is a 70 year old [name] with history of heart failure, chronic kidney disease, hypertension, sleep apnea, diabetes, morbid obesity and on prednisone taper for Graves' disease who presented to [facility] on 05/26/21 with abdominal pain and found to have perforated diverticulitis with abscess in addition to incidentally noted bilateral lower and central PEs with flattening LV for which heparin was started.  She also underwent venous Dopplers of her lower extremities yesterday revealing acute DVT involving left popliteal and posterior tibial veins.  On 6/15 she developed melena with drop in hemoglobin for which anticoagulation was stopped and GI consulted.  EGD today revealed normal esophagus, erythematous mucosa in the antrum, duodenitis but no evidence of active bleeding.  Latest labs include WBC  12.9, hemoglobin 7.3 (11 on 6/14), platelets 154k, creatinine 1.16.  Request now received from primary care team for IVC filter placement. Risks and benefits discussed with the patient /daughter including, but not limited to bleeding, infection, contrast induced renal failure, filter fracture or migration which can lead to emergency surgery or even death, strut penetration with damage or irritation to adjacent structures and caval thrombosis.  All of the patient's questions were answered, patient is agreeable to proceed. Consent signed and in chart.  Procedure scheduled for this afternoon.    Electronically Signed: D. Rowe Robert, PA-C 06/01/2021, 2:51 PM   I spent a total of 25 minutes at the the patient's bedside AND on the patient's hospital floor or unit, greater than 50% of which was  counseling/coordinating care for IVC filter placement    Patient ID: [name], [name]   DOB: 1951-08-23, 70 y.o.   MRN: [id number]

## 2021-06-01 NOTE — Progress Notes (Signed)
Pharmacy Antibiotic Note  [name] is a 70 y.o. female admitted on 05/26/2021 with Perf diverticulitis with abscess, .  Pharmacy has been consulted for Zosyn dosing.  ID: Perf diverticulitis with abscess, Not amenable to IR drainage at this time. - afeb, WBC 12.9 rising. Afebrile. Scr 1.16 up slightly. Zosyn 6/10>>  6/10: MRSA PCR: neg 6/10: GI panel: neg 6/14: Cdiff neg  Plan: Con't Zosyn 3.375g IV q8hr. Pharmacy will sign off, except TPN. Please reconsult for further dosing assitance.     Height: 5\' 2"  (157.5 cm) Weight: 109.1 kg (240 lb 8.4 oz) IBW/kg (Calculated) : 50.1  Temp (24hrs), Avg:98.1 F (36.7 C), Min:97.5 F (36.4 C), Max:98.8 F (37.1 C)  Recent Labs  Lab 05/28/21 0031 05/28/21 0945 05/29/21 0027 05/29/21 1631 05/30/21 0208 05/31/21 0221 06/01/21 0503  WBC 10.1  --  9.1  --  9.6 10.7* 12.9*  CREATININE  --  1.23*  --  1.02* 0.96 0.86 1.16*    Estimated Creatinine Clearance: 52.5 mL/min (A) (by C-G formula based on SCr of 1.16 mg/dL (H)).    Allergies  Allergen Reactions   Gluten Meal     Celiac    [name] S. 06/03/21, PharmD, BCPS Clinical Staff Pharmacist [email or web address]  Merilynn Finland 06/01/2021 2:23 PM

## 2021-06-01 NOTE — Progress Notes (Signed)
PHARMACY - TOTAL PARENTERAL NUTRITION CONSULT NOTE   Indication:  Intolerance to enteral feeding  Patient Measurements: Height: 5\' 2"  (157.5 cm) Weight: 109.1 kg (240 lb 8.4 oz) IBW/kg (Calculated) : 50.1 TPN AdjBW (KG): 64.9 Body mass index is 43.99 kg/m.  Assessment: 70 years of age female with known celiac disease, CHF, HTN, and DM admitted on 05/26/21 with perforated sigmoid diverticulitis and abscess as well as pulmonary embolism. IR unable to find a safe window for abscess drain placement and General Surgery recommending no surgery at this time. Attempted full liquids but had increased abdominal pain and loose stools so backed back down to clear liquids. Currently having bowel movements. Day #6 of hospital stay without adequate intake and patient has had N/V/D for 2 weeks prior to admission.   Daughter reports abdominal pain starting around 2 weeks ago which led to decreased oral intake which has gotten progressively worse leading up to admission. Since abdominal pain started, maybe would eat around 2 times per day.  Daughter reports patient would eat healthy (vegetables, fish, eggs) prior to abdominal pain. Drinks Glucerna shakes throughout the day.  Reports ~30 pound weight loss over the past 6 months. Patient is at risk for refeeding.   Now with melenic stools and drop in hemoglobin. GI following and planning for EGD today.   Glucose / Insulin: DM with uncontrolled CBGs - Lantus 15 qd and mSSI ordered. CBGs <120 this AM so insulin held. Restarting steroids as IV while NPO. Electrolytes: Mg 1.5 (giving 4g IV); all others wnl Renal: SCr 1.16, BUN 30 Hepatic: LFTs/Tbili wnl, TG 108 Intake / Output; MIVF: UOP not quantified, Last BM 6/15, no MIVF GI Imaging: plan for repeat CT Thursday or Friday 6/10 CT A/P: perforated diverticulitis w/ large abscess  GI Surgeries / Procedures:  6/16: plan for EGD today  Central access: PICC to be placed TPN start date: Plan for 6/16  Nutritional  Goals (per RD recommendation on 05/29/21): kCal: 2000-2200, Protein: 110-120, Fluid: >= 2L Goal TPN 90 ml/hr will provide 115g AA, 63g ILE, and 297g CHO for a total of 2100 kcal/day  Current Nutrition:  NPO and TPN - to start 6/16  Plan:  Start TPN at 57mL/hr at 1800 Electrolytes in TPN: Na 7mEq/L, K 68mEq/L, Ca 67mEq/L, Mg 54mEq/L, and Phos 34mmol/L. Cl:Ac 1:1 Add standard MVI and trace elements to TPN Adj steroids from PO to IV per MD (has not been receiving while NPO) Adj SSI to Resistant q4h given restarting steroids  Add regular insulin 10 units to TPN Discontinue Lantus outside TPN Give sodium phos 12m IV x1  Give additional Mg 2g IV for a total of 4g today Monitor TPN labs on Mon/Thurs  , PharmD PGY-1 Acute Care Pharmacy Resident 06/01/2021,7:03 AM

## 2021-06-01 NOTE — Progress Notes (Signed)
Physical Therapy Treatment Patient Details Name: [name] MRN: [id number] DOB: Oct 12, 1951 Today's Date: 06/01/2021    History of Present Illness 70 y.o. female admitted on 05/26/21 for sigmoid diverticulitis with perforation and abscess, IR consulted for surgery vs drain placement and had an unsucessful attempt on 05/28/21.  Bil PE started on IV heparin.  PMH including anemia, CHF, DM, graves disease, HTN, obesity, stroke, seizure, and thyroid disease.    PT Comments    The pt was seen by PT/OT to maximize safety with mobility and progression of strengthening/mobility. The pt presents with decreased appreciation for her abilities, but was able to demo good AROM in BLE in all movements other than hip flexion (needed light assist). The pt was then able to tolerate transition to sitting EOB with minA and max cues for encouragement and sequencing. BP dropped (detailed below) but pt remained asymptomatic through session. The pt was then able to progress to x2 squat-pivot transfers along the EOB with modA, and then x1 sit-stand transfer with modA to initiate hip clearance, but then minA only to power up to standing. The pt will continue to benefit from skilled PT to maximize functional recovery and strength.   Follow Up Recommendations  CIR     Equipment Recommendations  Rolling walker with 5" wheels;Wheelchair (measurements PT);Wheelchair cushion (measurements PT);Hospital bed    Recommendations for Other Services       Precautions / Restrictions Precautions Precautions: Fall Restrictions Weight Bearing Restrictions: No    Mobility  Bed Mobility Overal bed mobility: Needs Assistance Bed Mobility: Supine to Sit;Sit to Supine     Supine to sit: Min assist;+2 for physical assistance;HOB elevated Sit to supine: Mod assist;+2 for physical assistance   General bed mobility comments: Min A for intiating bringing BLEs towards EOB and then elevating trunk. Mod A +2 to lower trunk adn  bring BLEs over EOB in return to supine    Transfers Overall transfer level: Needs assistance Equipment used: Rolling walker (2 wheeled);2 person hand held assist Transfers: Sit to/from Stand Sit to Stand: Mod assist;+2 physical assistance         General transfer comment: Mod A +2 to complete x2 squat pivot along EOB and then modA for initial hip clearance and minA to rise to full stand. fatigues quickly  Ambulation/Gait             General Gait Details: pt stating she is unable, did not attempt to initiate stsp       Balance Overall balance assessment: Needs assistance Sitting-balance support: No upper extremity supported;Feet supported Sitting balance-Leahy Scale: Fair     Standing balance support: Bilateral upper extremity supported;During functional activity Standing balance-Leahy Scale: Poor Standing balance comment: reliant on support in standing.                            Cognition Arousal/Alertness: Lethargic Behavior During Therapy: Flat affect Overall Cognitive Status: Impaired/Different from baseline Area of Impairment: Attention;Memory;Following commands;Safety/judgement;Awareness;Problem solving                   Current Attention Level: Sustained Memory: Decreased short-term memory Following Commands: Follows one step commands inconsistently;Follows one step commands with increased time Safety/Judgement: Decreased awareness of safety;Decreased awareness of deficits Awareness: Intellectual Problem Solving: Difficulty sequencing;Requires verbal cues;Requires tactile cues General Comments: Oriented to place, time, and sitation (vague with situation). Slow to respond. easily distracted and then forgetful with external noise and distractions. Decreased awarenees  of her abilities and stating "I can't" thorughout. Repeating questions about who therapists' are despite already having been introduced      Exercises General Exercises -  Upper Extremity Shoulder Flexion: AAROM;Both;5 reps;Supine Shoulder Extension: AAROM;Both;5 reps;Supine Elbow Flexion: AROM;Both;10 reps;Supine Elbow Extension: AROM;Both;10 reps;Seated General Exercises - Lower Extremity Ankle Circles/Pumps: AROM;Both;10 reps;Supine Quad Sets: AROM;Both;10 reps;Supine Heel Slides: AAROM;Both;5 reps;Supine Hip ABduction/ADduction: AROM;Both;10 reps;Supine    General Comments General comments (skin integrity, edema, etc.): BP: 83/67 (74) sitting in bed in chair position, 90/69 (77) following leg lifts, 101/69 (79) sitting in bed with HOB elevated after arm and leg AROM, 69/39 (50) EOB, 89/72 (79) after 2 minutes EOB, 97/70 (80) supine. HR 100-110s (max 123). SpO2 >95 on RA.      Pertinent Vitals/Pain Pain Assessment: Faces Faces Pain Scale: Hurts whole lot Pain Location: Generalized Pain Descriptors / Indicators: Guarding;Grimacing Pain Intervention(s): Limited activity within patient's tolerance;Monitored during session;Repositioned    Home Living Family/patient expects to be discharged to:: Private residence Living Arrangements: Alone Available Help at Discharge: Family;Available PRN/intermittently ("Granddaughter comes to stay with me" Granddaughter works during day) Type of Home: Apartment Home Access: Level entry   Home Layout: One level Home Equipment: Environmental consultant - 2 wheels;Cane - single point      Prior Function Level of Independence: Needs assistance  Gait / Transfers Assistance Needed: Pt at her normal walks with a cane, drives, manages her own medications (however, she has a very significant fear of needles and family has been helping inject her to ADL's / Homemaking Assistance Needed: per pt report she was recently independent, does not drive but can drive.     PT Goals (current goals can now be found in the care plan section) Acute Rehab PT Goals Patient Stated Goal: to figure out what is wrong with her PT Goal Formulation: With  patient Time For Goal Achievement: 06/13/21 Potential to Achieve Goals: Fair Progress towards PT goals: Progressing toward goals    Frequency    Min 3X/week      PT Plan Current plan remains appropriate    Co-evaluation PT/OT/SLP Co-Evaluation/Treatment: Yes Reason for Co-Treatment: For patient/therapist safety;To address functional/ADL transfers PT goals addressed during session: Balance;Mobility/safety with mobility;Strengthening/ROM OT goals addressed during session: ADL's and self-care      AM-PAC PT "6 Clicks" Mobility   Outcome Measure  Help needed turning from your back to your side while in a flat bed without using bedrails?: A Lot Help needed moving from lying on your back to sitting on the side of a flat bed without using bedrails?: A Lot Help needed moving to and from a bed to a chair (including a wheelchair)?: A Lot Help needed standing up from a chair using your arms (e.g., wheelchair or bedside chair)?: A Lot Help needed to walk in hospital room?: Total Help needed climbing 3-5 steps with a railing? : Total 6 Click Score: 10    End of Session Equipment Utilized During Treatment: Gait belt Activity Tolerance: Patient limited by pain;Patient limited by lethargy;Patient limited by fatigue Patient left: in bed;with call bell/phone within reach;with bed alarm set Nurse Communication: Mobility status PT Visit Diagnosis: Muscle weakness (generalized) (M62.81);Difficulty in walking, not elsewhere classified (R26.2);Pain     Time: 1010-1044 PT Time Calculation (min) (ACUTE ONLY): 34 min  Charges:  $Therapeutic Activity: 8-22 mins                     Rolm Baptise, PT, DPT   Acute Rehabilitation Department  Pager #: [phone]   Gaetana Michaelis 06/01/2021, 4:35 PM

## 2021-06-01 NOTE — Progress Notes (Signed)
Peripherally Inserted Central Catheter Placement  The IV Nurse has discussed with the patient and/or persons authorized to consent for the patient, the purpose of this procedure and the potential benefits and risks involved with this procedure.  The benefits include less needle sticks, lab draws from the catheter, and the patient may be discharged home with the catheter. Risks include, but not limited to, infection, bleeding, blood clot (thrombus formation), and puncture of an artery; nerve damage and irregular heartbeat and possibility to perform a PICC exchange if needed/ordered by physician.  Alternatives to this procedure were also discussed.  Bard Power PICC patient education guide, fact sheet on infection prevention and patient information card has been provided to patient /or left at bedside.    PICC Placement Documentation  PICC Double Lumen 06/01/21 PICC Right Brachial 39 cm 0 cm (Active)  Indication for Insertion or Continuance of Line Administration of hyperosmolar/irritating solutions (i.e. TPN, Vancomycin, etc.) 06/01/21 2145  Exposed Catheter (cm) 0 cm 06/01/21 2145  Site Assessment Clean;Dry;Intact 06/01/21 2145  Lumen #1 Status Flushed;Blood return noted;Saline locked 06/01/21 2145  Lumen #2 Status Flushed;Blood return noted;Saline locked 06/01/21 2145  Dressing Type Transparent;Securing device 06/01/21 2145  Dressing Status Clean;Dry;Intact 06/01/21 2145  Antimicrobial disc in place? Yes 06/01/21 2145  Safety Lock Not Applicable 06/01/21 2145  Line Care Connections checked and tightened 06/01/21 2145  Dressing Intervention New dressing 06/01/21 2145  Dressing Change Due 06/08/21 06/01/21 2145       Levie Wages, Norton Pastel 06/01/2021, 9:48 PM

## 2021-06-01 NOTE — Progress Notes (Signed)
Spoke with Primary RN to see if the IV Team could come and place PICC She stated that the patient was off the floor in a procedure at this time. Primary RN to notify IV team when patient returns to unit.

## 2021-06-01 NOTE — Interval H&P Note (Signed)
History and Physical Interval Note:  06/01/2021 12:49 PM  [name]  has presented today for surgery, with the diagnosis of melena.  The various methods of treatment have been discussed with the patient and family. After consideration of risks, benefits and other options for treatment, the patient has consented to  Procedure(s): ESOPHAGOGASTRODUODENOSCOPY (EGD) WITH PROPOFOL (N/A) as a surgical intervention.  The patient's history has been reviewed, patient examined, no change in status, stable for surgery.  I have reviewed the patient's chart and labs.  Questions were answered to the patient's satisfaction.     Rin Gorton D

## 2021-06-01 NOTE — Progress Notes (Signed)
Late hang for IV TNA due to PICC insertion unable to be done prior to ordered hang time.

## 2021-06-01 NOTE — Progress Notes (Signed)
[facility]  Date of Admission:  05/26/2021           Reason for visit: Follow up on intra-abdominal abscess  Current antibiotics: Pip tazo 6/10-- present   Previous antibiotics: Cefepime 6/10 x1 MTZ 6/10 x1  ASSESSMENT:    Intra-abdominal abscess: Secondary to perforated diverticulitis with initial imaging measuring 9.2 x 5.8 cm.  This was not amenable to IR drainage.  Surgery has evaluated her and tentatively planning for repeat imaging versus surgical exploration.  There are concerns that this will not fully resolve without some sort of drainage or surgical procedure given the abscess size.  She continues on piperacillin tazobactam for now. Melena/GI bleed: Complicated by heparin drip required for bilateral PE/DVT which has now been stopped given her precipitous drop in hemoglobin.  Gastroenterology planning for EGD today. Bilateral PE/DVT: Heparin drip on hold in the setting of GI bleed. 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 insulin aspart  0-20 Units Subcutaneous Q4H  . insulin glargine  15 Units Subcutaneous Daily  . levothyroxine  112 mcg Oral Once per day on Mon Tue Wed Thu Fri Sat   And  . [START ON 06/04/2021] levothyroxine  56 mcg Oral Once per day on Sun  . methylPREDNISolone (SOLU-MEDROL) injection  24 mg Intravenous Daily   Followed by  . [START ON 06/06/2021] methylPREDNISolone (SOLU-MEDROL) injection  16 mg Intravenous  Daily   Followed by  . [START ON 06/11/2021] methylPREDNISolone (SOLU-MEDROL) injection  8 mg Intravenous Daily  . pantoprazole  40 mg Oral BID  . rosuvastatin  5 mg Oral Daily   Continuous Infusions: . magnesium sulfate bolus IVPB    . piperacillin-tazobactam (ZOSYN)  IV 3.375 g (06/01/21 16100611)  . sodium phosphate  Dextrose 5% IVPB     PRN Meds:.liver oil-zinc oxide, morphine injection, ondansetron (ZOFRAN) IV  SUBJECTIVE:   24 hour events:  No acute events noted overnight Afebrile WBC slightly increased to 12.9 Hemoglobin continues to drop most recently 7.3 down from 13.1 on 6/10 Seen by GI with plan for EGD today Heparin drip has been stopped Lower extremity Dopplers significant for DVT involving the left popliteal and left posterior tibial veins  Patient reports no current nausea at the moment.  She has been n.p.o. but tolerating sucking on cold water sponges.  She continues to have abdominal pain that randomly comes and goes but she does not associate the pain with anything specific.  She has no fevers or chills.  She seems to be tolerating her antibiotics.  Review of Systems  All other systems reviewed and are negative.    OBJECTIVE:   Blood pressure 111/80, pulse (!) 107, temperature 98.1 F (36.7 C), temperature source Oral, resp. rate 15, height 5\' 2"  (1.575 m), weight 109.1 kg, SpO2 99 %. Body mass index is 43.99 kg/m.  Physical Exam Constitutional:      Comments: Ill-appearing woman, lying in bed, no acute distress, holding a blue vomit bag close to her mouth  HENT:     Head: Normocephalic and atraumatic.  Eyes:     Extraocular Movements: Extraocular movements intact.     Conjunctiva/sclera: Conjunctivae normal.     Comments: Bilateral exophthalmos  Cardiovascular:     Rate and Rhythm: Tachycardia present.  Pulmonary:     Effort: Pulmonary effort is normal. No respiratory distress.  Abdominal:     General: There is no distension.     Palpations: Abdomen  is soft.     Tenderness: There is no abdominal tenderness. There is no guarding or rebound.  Musculoskeletal:     Cervical back: Normal range of motion and neck supple.  Skin:    General: Skin is warm and dry.     Coloration: Skin is pale.  Neurological:     General: No focal deficit present.     Mental Status: She is oriented to person, place, and time.  Psychiatric:        Mood and Affect: Mood normal.        Behavior: Behavior normal.     Lab Results: Lab Results  Component Value Date   WBC 12.9 (H) 06/01/2021   HGB 7.3 (L) 06/01/2021   HCT 23.4 (L) 06/01/2021   MCV 96.6 06/01/2021   PLT 154 06/01/2021    Lab Results  Component Value Date   NA 139 06/01/2021   K 4.2 06/01/2021   CO2 25 06/01/2021   GLUCOSE 107 (H) 06/01/2021   BUN 30 (H) 06/01/2021   CREATININE 1.16 (H) 06/01/2021   CALCIUM 8.3 (L) 06/01/2021   GFRNONAA 51 (L) 06/01/2021   GFRAA 61 11/07/2020    Lab Results  Component Value Date   ALT 19 06/01/2021   AST 16 06/01/2021   ALKPHOS 99 06/01/2021   BILITOT 0.7 06/01/2021    No results found for: CRP  No results found for: ESRSEDRATE   I have reviewed the micro and lab results in Epic.  Imaging: VAS [place] LOWER EXTREMITY VENOUS (DVT)  Result Date: 05/31/2021  Lower Venous DVT Study Patient Name:  [name]  Date of Exam:   05/31/2021 Medical Rec #: [id number]           Accession #:    [id number] Date of Birth: 12/28/1950          Patient Gender: F Patient Age:   69Y Exam Location:  [facility] Procedure:      VAS [place] LOWER EXTREMITY VENOUS (DVT) Referring Phys: [id number] RALPH A NETTEY --------------------------------------------------------------------------------  Indications: Pulmonary embolism.  Risk Factors: Immobility. Limitations: Body habitus, poor ultrasound/tissue interface and Patient positioning. Comparison Study: No previous exams Performing Technologist: Jody Hill RVT, RDMS  Examination Guidelines: A complete evaluation includes  B-mode imaging, spectral Doppler, color Doppler, and power Doppler as needed of all accessible portions of each vessel. Bilateral testing is considered an integral part of a complete examination. Limited examinations for reoccurring indications may be performed as noted. The reflux portion of the exam is performed with the patient in reverse Trendelenburg.  +---------+---------------+---------+-----------+----------+-------------------+ RIGHT    CompressibilityPhasicitySpontaneityPropertiesThrombus Aging      +---------+---------------+---------+-----------+----------+-------------------+ CFV      Full           Yes      Yes                                      +---------+---------------+---------+-----------+----------+-------------------+ SFJ      Full                                                             +---------+---------------+---------+-----------+----------+-------------------+  FV Prox  Full           Yes      Yes                                      +---------+---------------+---------+-----------+----------+-------------------+ FV Mid   Full           Yes      Yes                                      +---------+---------------+---------+-----------+----------+-------------------+ FV DistalFull           Yes      Yes                                      +---------+---------------+---------+-----------+----------+-------------------+ PFV      Full                                                             +---------+---------------+---------+-----------+----------+-------------------+ POP                     Yes      Yes                  Patent by color and                                                       doppler             +---------+---------------+---------+-----------+----------+-------------------+ PTV      Full                                                              +---------+---------------+---------+-----------+----------+-------------------+ PERO                                                  not visualized on                                                         this exam           +---------+---------------+---------+-----------+----------+-------------------+   Right Technical Findings: Right leg not evaluated. Not visualized segments include peroneal veins.  +---------+---------------+---------+-----------+----------+-------------------+ LEFT     CompressibilityPhasicitySpontaneityPropertiesThrombus Aging      +---------+---------------+---------+-----------+----------+-------------------+ CFV      Full           Yes  Yes                                      +---------+---------------+---------+-----------+----------+-------------------+ SFJ      Full                                                             +---------+---------------+---------+-----------+----------+-------------------+ FV Prox  Full           Yes      Yes                                      +---------+---------------+---------+-----------+----------+-------------------+ FV Mid   Full           Yes      Yes                                      +---------+---------------+---------+-----------+----------+-------------------+ FV DistalFull           Yes      Yes                                      +---------+---------------+---------+-----------+----------+-------------------+ PFV      Full                                                             +---------+---------------+---------+-----------+----------+-------------------+ POP      None           No       No                   Acute               +---------+---------------+---------+-----------+----------+-------------------+ PTV      None           No       No                   Acute                +---------+---------------+---------+-----------+----------+-------------------+ PERO                                                  not visualized on                                                         this exam           +---------+---------------+---------+-----------+----------+-------------------+   Left Technical Findings: Left leg not  evaluated. Not visualized segments include peroneal veins.   Summary: BILATERAL: - No evidence of superficial venous thrombosis in the lower extremities, bilaterally. -No evidence of popliteal cyst, bilaterally. RIGHT: - There is no evidence of deep vein thrombosis in the lower extremity.  LEFT: - Findings consistent with acute deep vein thrombosis involving the left popliteal vein, and left posterior tibial veins.  *See table(s) above for measurements and observations. Electronically signed by Coral Else MD on 05/31/2021 at 7:48:20 PM.    Final    [place] EKG SITE RITE  Result Date: 05/31/2021 If Site Rite image not attached, placement could not be confirmed due to current cardiac rhythm.    Imaging independently reviewed in Epic.    Vedia Coffer for Infectious Disease [facility] [phone] pager 06/01/2021, 10:26 AM

## 2021-06-01 NOTE — Progress Notes (Signed)
Nutrition Follow-up  DOCUMENTATION CODES:   Obesity unspecified  INTERVENTION:   - TPN dosing per pharmacy  Monitor magnesium, potassium, and phosphorus twice daily for at least 3 days, MD to replete as needed, as pt is at risk for refeeding syndrome given inadequate nutrition x 6 days (likely longer).  NUTRITION DIAGNOSIS:   Inadequate oral intake related to altered GI function, acute illness as evidenced by NPO status.  Ongoing, being addressed via initiation of TPN  GOAL:   Patient will meet greater than or equal to 90% of their needs  Progressing with initiation of TPN  MONITOR:   Diet advancement, PO intake, Weight trends  REASON FOR ASSESSMENT:   Consult New TPN/TNA  ASSESSMENT:   70 year old female admitted with abdominal pain, N/V/D x 2 weeks with perforated sigmoid diverticulitis with abscess, also with PE. PMH includes CHF, HTN, DM, celiac disease.  6/12 - clear liquids 6/13 - full liquids 6/15 - NPO  Pt with significant abdominal pain and nausea when on full liquid diet. Pt was not tolerating diet advancement. Pt currently NPO for EGD today due to melena and concern for upper GI bleed.  Plan is for PICC placement and initiation of TPN today as pt is currently day 6 of this admission without adequate nutrition and has not tolerated diet advancement. Pt also with a 2 week history PTA of N/V/D.  Spoke with pt at bedside. Daughter Alphonzo Lemmings was not present. Pt reports being unable to tolerate diet advancement as is described in Surgery notes. Pt not as coherent as she was during RD visit on Monday. Pt appeared a little more confused. Discussed plan for initiation of TPN for nutrition. Pt states that she is "all for it."  Per Pharmacy note, plan is to start TPN today at 35 ml/hr. Goal rate is 90 ml/hr which will provide 2100 kcal and 115 grams of protein.  Per Surgery note, given size of mid-sigmoid colon abscess, it may not fully resolve without IR drainage or  surgery. Plan for repeat CT today or Friday.  Medications reviewed and include: SSI q 4 hours, lantus 15 units daily, protonix, prednisone taper, IV magnesium sulfate 2 grams once, IV abx  Labs reviewed: BUN 30, creatinine 1.16, magnesium 1.5 (being replaced), hemoglobin 7.3, HCT 23.4 CBG's: 86-144 x 24 hours  Diet Order:   Diet Order             Diet NPO time specified  Diet effective midnight                   EDUCATION NEEDS:   Not appropriate for education at this time  Skin:  Skin Assessment: Skin Integrity Issues: Other: MASD to perineal region  Last BM:  05/31/21 large red type 7 via rectal tube  Height:   Ht Readings from Last 1 Encounters:  05/27/21 5\' 2"  (1.575 m)    Weight:   Wt Readings from Last 1 Encounters:  05/27/21 109.1 kg    BMI:  Body mass index is 43.99 kg/m.  Estimated Nutritional Needs:   Kcal:  2000-2200 kcals  Protein:  110-120 grams  Fluid:  >/= 2 L    07/27/21, MS, RD, LDN Inpatient Clinical Dietitian Please see AMiON for contact information.

## 2021-06-01 NOTE — Procedures (Signed)
Interventional Radiology Procedure Note  Procedure: IVC filter placement  Indication: PE/DVT.  GI Bleed.  Findings: Please refer to procedural dictation for full description.  Complications: None  EBL: < 10 mL  Acquanetta Belling, MD [phone]

## 2021-06-01 NOTE — Progress Notes (Signed)
   06/01/21 1217  Clinical Encounter Type  Visited With Patient  Visit Type Initial  Referral From Family  Consult/Referral To Chaplain  Spiritual Encounters  Spiritual Needs Prayer  Advance Directives (For Healthcare)  Does Patient Have a Medical Advance Directive? No  Would patient like information on creating a medical advance directive? No - Patient declined  The chaplain responded to the page to speak with the patient about an advance directive. The patient spoke about her condition and said that her daughters were making medical decisions in the event she was unable to make them. She asked someone to follow up when her daughter was present to talk more about the advanced direct. The chaplain spoke with the daughter Alphonzo Lemmings over the phone. The chaplain prayed with the patient prior to leaving the room in order to ease the patient's mind about an upcoming procedure. The chaplain will pass the information along to the chaplain on the floor for follow up.

## 2021-06-01 NOTE — Progress Notes (Addendum)
General Surgery Follow Up Note  Subjective:    Overnight Issues:   Objective:  Vital signs for last 24 hours: Temp:  [97.5 F (36.4 C)-98.8 F (37.1 C)] 97.5 F (36.4 C) (06/16 0324) Pulse Rate:  [96-107] 107 (06/16 0324) Resp:  [13-18] 15 (06/16 0324) BP: (92-120)/(66-87) 120/87 (06/16 0324) SpO2:  [99 %-100 %] 99 % (06/16 0324)  Hemodynamic parameters for last 24 hours:    Intake/Output from previous day: 06/15 0701 - 06/16 0700 In: 110.1 [I.V.:110.1] Out: -   Intake/Output this shift: No intake/output data recorded.  Vent settings for last 24 hours:    Physical Exam:  Gen: comfortable, no distress Neuro: non-focal exam, f/c HEENT: PERRL Neck: supple CV: RRR Pulm: mildly labored breathing on Monona Abd: soft, NT GU: clear yellow urine Extr: wwp, +LE edema   Results for orders placed or performed during the hospital encounter of 05/26/21 (from the past 24 hour(s))  Hemoglobin and hematocrit, blood     Status: Abnormal   Collection Time: 05/31/21 11:03 AM  Result Value Ref Range   Hemoglobin 8.3 (L) 12.0 - 15.0 g/dL   HCT 67.6 (L) 19.5 - 09.3 %  Glucose, capillary     Status: Abnormal   Collection Time: 05/31/21 11:15 AM  Result Value Ref Range   Glucose-Capillary 211 (H) 70 - 99 mg/dL  Glucose, capillary     Status: Abnormal   Collection Time: 05/31/21  3:56 PM  Result Value Ref Range   Glucose-Capillary 135 (H) 70 - 99 mg/dL  Hemoglobin and hematocrit, blood     Status: Abnormal   Collection Time: 05/31/21  4:51 PM  Result Value Ref Range   Hemoglobin 8.0 (L) 12.0 - 15.0 g/dL   HCT 26.7 (L) 12.4 - 58.0 %  Glucose, capillary     Status: Abnormal   Collection Time: 05/31/21  5:09 PM  Result Value Ref Range   Glucose-Capillary 144 (H) 70 - 99 mg/dL  Glucose, capillary     Status: None   Collection Time: 05/31/21  8:05 PM  Result Value Ref Range   Glucose-Capillary 86 70 - 99 mg/dL  Glucose, capillary     Status: None   Collection Time: 05/31/21  11:21 PM  Result Value Ref Range   Glucose-Capillary 92 70 - 99 mg/dL  Glucose, capillary     Status: None   Collection Time: 06/01/21  4:02 AM  Result Value Ref Range   Glucose-Capillary 89 70 - 99 mg/dL  Triglycerides     Status: None   Collection Time: 06/01/21  5:03 AM  Result Value Ref Range   Triglycerides 108 <150 mg/dL  CBC     Status: Abnormal   Collection Time: 06/01/21  5:03 AM  Result Value Ref Range   WBC 12.9 (H) 4.0 - 10.5 K/uL   RBC 2.33 (L) 3.87 - 5.11 MIL/uL   Hemoglobin 7.2 (L) 12.0 - 15.0 g/dL   HCT 99.8 (L) 33.8 - 25.0 %   MCV 96.6 80.0 - 100.0 fL   MCH 30.9 26.0 - 34.0 pg   MCHC 32.0 30.0 - 36.0 g/dL   RDW 53.9 76.7 - 34.1 %   Platelets 154 150 - 400 K/uL   nRBC 0.5 (H) 0.0 - 0.2 %  Differential     Status: Abnormal   Collection Time: 06/01/21  5:03 AM  Result Value Ref Range   Neutrophils Relative % 76 %   Neutro Abs 9.8 (H) 1.7 - 7.7 K/uL   Lymphocytes  Relative 15 %   Lymphs Abs 1.9 0.7 - 4.0 K/uL   Monocytes Relative 6 %   Monocytes Absolute 0.8 0.1 - 1.0 K/uL   Eosinophils Relative 0 %   Eosinophils Absolute 0.0 0.0 - 0.5 K/uL   Basophils Relative 0 %   Basophils Absolute 0.0 0.0 - 0.1 K/uL   WBC Morphology MILD LEFT SHIFT (1-5% METAS, OCC MYELO, OCC BANDS)    Immature Granulocytes 3 %   Abs Immature Granulocytes 0.40 (H) 0.00 - 0.07 K/uL  Comprehensive metabolic panel     Status: Abnormal   Collection Time: 06/01/21  5:03 AM  Result Value Ref Range   Sodium 139 135 - 145 mmol/L   Potassium 4.2 3.5 - 5.1 mmol/L   Chloride 107 98 - 111 mmol/L   CO2 25 22 - 32 mmol/L   Glucose, Bld 107 (H) 70 - 99 mg/dL   BUN 30 (H) 8 - 23 mg/dL   Creatinine, Ser 8.83 (H) 0.44 - 1.00 mg/dL   Calcium 8.3 (L) 8.9 - 10.3 mg/dL   Total Protein 4.1 (L) 6.5 - 8.1 g/dL   Albumin 1.7 (L) 3.5 - 5.0 g/dL   AST 16 15 - 41 U/L   ALT 19 0 - 44 U/L   Alkaline Phosphatase 99 38 - 126 U/L   Total Bilirubin 0.7 0.3 - 1.2 mg/dL   GFR, Estimated 51 (L) >60 mL/min   Anion  gap 7 5 - 15  Magnesium     Status: Abnormal   Collection Time: 06/01/21  5:03 AM  Result Value Ref Range   Magnesium 1.5 (L) 1.7 - 2.4 mg/dL  Phosphorus     Status: None   Collection Time: 06/01/21  5:03 AM  Result Value Ref Range   Phosphorus 2.5 2.5 - 4.6 mg/dL  Prealbumin     Status: Abnormal   Collection Time: 06/01/21  5:03 AM  Result Value Ref Range   Prealbumin 7.7 (L) 18 - 38 mg/dL    Assessment & Plan: The plan of care was discussed with the bedside nurse for the day,  Joni Reining, who is in agreement with this plan and no additional concerns were raised.   Present on Admission:  Diverticulitis of colon with perforation  Chronic diastolic CHF (congestive heart failure) (HCC)  Moderate persistent asthma, uncomplicated    LOS: 6 days   Additional comments:I reviewed the patient's new clinical lab test results.   and I reviewed the patients new imaging test results.     35F Diverticulitis with intra-abdominal abscess (9x5 cm) -afebrile, low blood pressure, WBC 12.9 from 10.7 - IR without a safe window for abscess drainage 6/12 - moderate tenderness on exam 6/15, but nontender this AM. Did not tolerate FLD 6/15, currently NPO for EGD this AM - continue IV abx - await EGD results, plan for repeat CT scan vs operative exploration, however abdominal exam is fairly benign today, so in favor of repeat imaging. Given the size of the abscess, it may not fully resolve without IR drainage or surgery. Discussed this AM the near 100% likelihood of colostomy creation at the time of surgery - No emergent surgical needs today.   - OOB/mobilize with therapies    FEN: NPO pending EGD, cont NPO post-procedure while making decision regarding surgery GP:QDIYM VTE: SCD's, hep gtt on hold, continue holding post-EGD wile making decision regarding surgery Foley: none, external cath Dispo: SDU   - Below per primary service - Bilateral PE-heparin IV, recommend considering IVCF Acute  respiratory  failure with hypoxia Thombocytopenia Chronic diastolic heart failure CKD IIIa HTN DM2 Morbid obesity      Diamantina Monks, MD Trauma & General Surgery Please use [email or web address] to contact on call provider  06/01/2021  *Care during the described time interval was provided by me. I have reviewed this patient's available data, including medical history, events of note, physical examination and test results as part of my evaluation.

## 2021-06-01 NOTE — Transfer of Care (Signed)
Immediate Anesthesia Transfer of Care Note  Patient: [name]  Procedure(s) Performed: ESOPHAGOGASTRODUODENOSCOPY (EGD) WITH PROPOFOL  Patient Location: Endoscopy Unit  Anesthesia Type:MAC  Level of Consciousness: lethargic and responds to stimulation  Airway & Oxygen Therapy: Patient Spontanous Breathing  Post-op Assessment: Report given to RN  Post vital signs: Reviewed and stable  Last Vitals:  Vitals Value Taken Time  BP 87/62 06/01/21 1308  Temp    Pulse 89 06/01/21 1309  Resp 16 06/01/21 1310  SpO2 96 % 06/01/21 1309  Vitals shown include unvalidated device data.  Last Pain:  Vitals:   06/01/21 1218  TempSrc: Oral  PainSc: 0-No pain      Patients Stated Pain Goal: 0 (74/25/95 [id number])  Complications: No notable events documented.

## 2021-06-01 NOTE — Op Note (Addendum)
[facility] Patient Name: [name] Procedure Date : 06/01/2021 MRN: [id number] Attending MD: Jeani Hawking , MD Date of Birth: 1951-08-30 CSN: [id number] Age: 70 Admit Type: Inpatient Procedure:                Upper GI endoscopy Indications:              Melena Providers:                Jeani Hawking, MD, Clearnce Sorrel, RN, Rosilyn Mings, Technician Referring MD:              Medicines:                Propofol per Anesthesia Complications:            No immediate complications. Estimated Blood Loss:     Estimated blood loss: none. Procedure:                Pre-Anesthesia Assessment:                           - Prior to the procedure, a History and Physical                            was performed, and patient medications and                            allergies were reviewed. The patient's tolerance of                            previous anesthesia was also reviewed. The risks                            and benefits of the procedure and the sedation                            options and risks were discussed with the patient.                            All questions were answered, and informed consent                            was obtained. Prior Anticoagulants: The patient has                            taken no previous anticoagulant or antiplatelet                            agents. ASA Grade Assessment: III - A patient with                            severe systemic disease. After reviewing the risks                            and  benefits, the patient was deemed in                            satisfactory condition to undergo the procedure.                           - Sedation was administered by an anesthesia                            professional. Deep sedation was attained.                           After obtaining informed consent, the endoscope was                            passed under direct vision. Throughout the                             procedure, the patient's blood pressure, pulse, and                            oxygen saturations were monitored continuously. The                            GIF-H190 ([id number] Olympus gastroscope was                            introduced through the mouth, and advanced to the                            second part of duodenum. The upper GI endoscopy was                            accomplished without difficulty. The patient                            tolerated the procedure well. Scope In: Scope Out: Findings:      The esophagus was normal.      Patchy moderately erythematous mucosa without bleeding was found in the       gastric antrum.      Patchy moderate inflammation characterized by erythema was found in the       duodenal bulb.      There was no evidence of any active bleeding. In the antrum there were       patches of erythema, but no clear ulcerations or erosions. In the       duodenum there was a more confluent duodenitis and there was the       possibility of a healing serpentine ulceration. If it was an ulceration       the ulcer(s) were nearly healed. Impression:               - Normal esophagus.                           - Erythematous mucosa in the antrum.                           -  Duodenitis.                           - No specimens collected. Recommendation:           - Return patient to hospital ward for ongoing care.                           - Full liquid diet.                           - Continue present medications.                           - Continue with PPI.                           - Check H. pylori serology in this situation and                            treat if positive.                           - Follow HGB and transfuse if necessary.                           - Management of diverticular abscess per Surgery.                           - Signing off. Call with any questions. Procedure Code(s):        --- Professional ---                            [phone], Esophagogastroduodenoscopy, flexible,                            transoral; diagnostic, including collection of                            specimen(s) by brushing or washing, when performed                            (separate procedure) Diagnosis Code(s):        --- Professional ---                           K31.89, Other diseases of stomach and duodenum                           K29.80, Duodenitis without bleeding                           K92.1, Melena (includes Hematochezia) CPT copyright 2019 [employer]. All rights reserved. The codes documented in this report are preliminary and upon coder review may  be revised to meet current compliance requirements. Jeani Hawking, MD Jeani Hawking, MD 06/01/2021 1:17:53 PM This report has been signed electronically. Number of Addenda: 0

## 2021-06-02 DIAGNOSIS — J9601 Acute respiratory failure with hypoxia: Secondary | ICD-10-CM

## 2021-06-02 DIAGNOSIS — E039 Hypothyroidism, unspecified: Secondary | ICD-10-CM

## 2021-06-02 LAB — PREPARE RBC (CROSSMATCH)

## 2021-06-02 LAB — CBC
HCT: 23.3 % — ABNORMAL LOW (ref 36.0–46.0)
HCT: 24.6 % — ABNORMAL LOW (ref 36.0–46.0)
Hemoglobin: 7.6 g/dL — ABNORMAL LOW (ref 12.0–15.0)
Hemoglobin: 8 g/dL — ABNORMAL LOW (ref 12.0–15.0)
MCH: 31.5 pg (ref 26.0–34.0)
MCH: 30.8 pg (ref 26.0–34.0)
MCHC: 32.6 g/dL (ref 30.0–36.0)
MCHC: 32.5 g/dL (ref 30.0–36.0)
MCV: 96.7 fL (ref 80.0–100.0)
MCV: 94.6 fL (ref 80.0–100.0)
Platelets: 151 10*3/uL (ref 150–400)
Platelets: 144 10*3/uL — ABNORMAL LOW (ref 150–400)
RBC: 2.41 MIL/uL — ABNORMAL LOW (ref 3.87–5.11)
RBC: 2.6 MIL/uL — ABNORMAL LOW (ref 3.87–5.11)
RDW: 16.5 % — ABNORMAL HIGH (ref 11.5–15.5)
RDW: 17 % — ABNORMAL HIGH (ref 11.5–15.5)
WBC: 16.5 10*3/uL — ABNORMAL HIGH (ref 4.0–10.5)
WBC: 17.9 10*3/uL — ABNORMAL HIGH (ref 4.0–10.5)
nRBC: 1.1 % — ABNORMAL HIGH (ref 0.0–0.2)
nRBC: 1.3 % — ABNORMAL HIGH (ref 0.0–0.2)

## 2021-06-02 LAB — BASIC METABOLIC PANEL
Anion gap: 10 (ref 5–15)
BUN: 21 mg/dL (ref 8–23)
CO2: 21 mmol/L — ABNORMAL LOW (ref 22–32)
Calcium: 8.4 mg/dL — ABNORMAL LOW (ref 8.9–10.3)
Chloride: 106 mmol/L (ref 98–111)
Creatinine, Ser: 1.05 mg/dL — ABNORMAL HIGH (ref 0.44–1.00)
GFR, Estimated: 57 mL/min — ABNORMAL LOW (ref >60)
Glucose, Bld: 272 mg/dL — ABNORMAL HIGH (ref 70–99)
Potassium: 3.8 mmol/L (ref 3.5–5.1)
Sodium: 137 mmol/L (ref 135–145)

## 2021-06-02 LAB — GLUCOSE, CAPILLARY
Glucose-Capillary: 326 mg/dL — ABNORMAL HIGH (ref 70–99)
Glucose-Capillary: 239 mg/dL — ABNORMAL HIGH (ref 70–99)
Glucose-Capillary: 216 mg/dL — ABNORMAL HIGH (ref 70–99)
Glucose-Capillary: 239 mg/dL — ABNORMAL HIGH (ref 70–99)
Glucose-Capillary: 251 mg/dL — ABNORMAL HIGH (ref 70–99)
Glucose-Capillary: 270 mg/dL — ABNORMAL HIGH (ref 70–99)

## 2021-06-02 LAB — PHOSPHORUS: Phosphorus: 3.4 mg/dL (ref 2.5–4.6)

## 2021-06-02 LAB — HEMOGLOBIN AND HEMATOCRIT, BLOOD
HCT: 21.4 % — ABNORMAL LOW (ref 36.0–46.0)
Hemoglobin: 6.7 g/dL — CL (ref 12.0–15.0)

## 2021-06-02 LAB — H. PYLORI ANTIBODY, IGG: H Pylori IgG: 0.28 Index Value (ref 0.00–0.79)

## 2021-06-02 LAB — MAGNESIUM: Magnesium: 2 mg/dL (ref 1.7–2.4)

## 2021-06-02 MED ORDER — INSULIN GLARGINE 100 UNIT/ML ~~LOC~~ SOLN
8.0000 [IU] | Freq: Every day | SUBCUTANEOUS | Status: DC
  Filled 2021-06-02: qty 0.08

## 2021-06-02 MED ORDER — SODIUM CHLORIDE 0.9% IV SOLUTION: Freq: Once | INTRAVENOUS | Status: DC

## 2021-06-02 MED ORDER — TRAVASOL 10 % IV SOLN
INTRAVENOUS | Status: AC
  Filled 2021-06-02: qty 445.2

## 2021-06-02 MED ORDER — INSULIN GLARGINE 100 UNIT/ML ~~LOC~~ SOLN
12.0000 [IU] | Freq: Every day | SUBCUTANEOUS | Status: DC
  Administered 2021-06-02 – 2021-06-03 (×2): 12 [IU] via SUBCUTANEOUS
  Filled 2021-06-02 (×3): qty 0.12

## 2021-06-02 MED ORDER — BOOST / RESOURCE BREEZE PO LIQD CUSTOM
1.0000 | Freq: Three times a day (TID) | ORAL | Status: DC
  Administered 2021-06-02 – 2021-06-05 (×8): 1 via ORAL

## 2021-06-02 MED ORDER — GERHARDT'S BUTT CREAM
TOPICAL_CREAM | CUTANEOUS | Status: DC | PRN
  Filled 2021-06-02: qty 1

## 2021-06-02 NOTE — Progress Notes (Signed)
Nutrition Follow-up  DOCUMENTATION CODES:   Obesity unspecified  INTERVENTION:   - TPN dosing per pharmacy  Continue to monitor magnesium, potassium, and phosphorus twice daily for at least 3 days total, MD to replete as needed, as pt is at risk for refeeding syndrome.  - Boost Breeze po TID, each supplement provides 250 kcal and 9 grams of protein  NUTRITION DIAGNOSIS:   Inadequate oral intake related to altered GI function, acute illness as evidenced by NPO status.  Ongoing, being addressed via TPN and diet advancement  GOAL:   Patient will meet greater than or equal to 90% of their needs  Progressing with TPN and diet advancement  MONITOR:   Diet advancement, PO intake, Weight trends, I & O's, Supplement acceptance  REASON FOR ASSESSMENT:   Consult New TPN/TNA  ASSESSMENT:   70 year old female admitted with abdominal pain, N/V/D x 2 weeks with perforated sigmoid diverticulitis with abscess, also with PE. PMH includes CHF, HTN, DM, celiac disease.  6/12 - clear liquids 6/13 - full liquids 6/15 - NPO 6/16 - s/p EGD without evidence of active bleeding, s/p IVC filter placement by IR, PICC placed, TPN started 6/17 - clear liquids  Pt's diet was advanced to clear liquids today but per Surgery note, pt having abdominal pain with PO liquids. Per Surgery, plan is to monitor abdominal exam on liquid diet and likely repeat imaging tomorrow if not improving to re-evaluate intraabdominal abscess and possible window for IR drainage.  Spoke with pt and daughter Alphonzo Lemmings at bedside. Pt with an opened bottle of Ensure at bedside and reports that she either drank it last night or this morning but doesn't remember. Pt reports also drinking some water, coffee, and broth. Pt denies any N/V at this time. Pt's daughter Alphonzo Lemmings reports pt has "more energy" now that TPN has been started.  Pt willing to try Boost Breeze oral nutrition supplements. She is concerned about her blood sugars  being elevated. Discussed with pt that insulin can be adjusted as need to cover for higher blood sugars.  Medications reviewed and include: SSI q 4 hours, lantus 8 units daily, levothyroxine, IV solu-medrol, protonix, IV abx  Labs reviewed: creatinine 1.05, hemoglobin 7.6 CBG's: 126-341 x 24 hours  I/O's: +2.2 L since admit  Diet Order:   Diet Order             Diet clear liquid Room service appropriate? Yes; Fluid consistency: Thin  Diet effective now                   EDUCATION NEEDS:   Not appropriate for education at this time  Skin:  Skin Assessment: Skin Integrity Issues: Other: MASD to perineal region  Last BM:  06/02/21 type 7 via rectal tube  Height:   Ht Readings from Last 1 Encounters:  05/27/21 5\' 2"  (1.575 m)    Weight:   Wt Readings from Last 1 Encounters:  05/27/21 109.1 kg    BMI:  Body mass index is 43.99 kg/m.  Estimated Nutritional Needs:   Kcal:  2000-2200 kcals  Protein:  110-120 g  Fluid:  >/= 2 L    07/27/21, MS, RD, LDN Inpatient Clinical Dietitian Please see AMiON for contact information.

## 2021-06-02 NOTE — Progress Notes (Signed)
[facility]  Date of Admission:  05/26/2021           Reason for visit: Follow up on intra-abdominal abscess  Current antibiotics: Pip tazo 6/10-- present   Previous antibiotics: Cefepime 6/10 x1 MTZ 6/10 x1   ASSESSMENT:    Intra-abdominal abscess: Secondary to perforated diverticulitis with initial measurement of 9.2 x 5.8 cm on CT scan.  This was not amenable to IR drainage.  Surgery has been following and tentatively planning for repeat imaging versus surgical exploration.  There are concerns that this will not fully resolve without some sort of drainage or surgical procedure given the abscess size.  She continues on piperacillin tazobactam with stability for now. Melena/GI bleed: Complicated by heparin drip that was required for bilateral PE/DVT.  This is currently being held given her drop in hemoglobin which was 6.7 this morning and she received blood products.  Status post IVC filter yesterday as well as EGD which did not show any active bleeding. Bilateral PE/DVT: She was having GI bleed with heparin and so this is being held.  Status post IVC filter yesterday. Acute hypoxic respiratory failure: Secondary to #3. Diabetes: A1c is 9.4 and has been hyperglycemic inpatient. Obesity: Body mass index is 43.99 kg/m. Hypothyroidism, Graves' disease: With exophthalmos and currently on a steroid taper.  PLAN:    Continue piperacillin tazobactam Follow-up surgery plans for repeat imaging versus OR exploration Glycemic control, anticoagulation per primary team Will follow.  Dr. 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 sodium chloride   Intravenous Once  . Chlorhexidine Gluconate Cloth  6 each Topical Daily  . insulin aspart  0-20 Units Subcutaneous Q4H  . insulin glargine  8 Units Subcutaneous Daily  . levothyroxine  112 mcg Oral Once per day on Mon Tue Wed Thu Fri Sat   And  . [START ON 06/04/2021] levothyroxine  56 mcg Oral Once per day on Sun  . methylPREDNISolone (SOLU-MEDROL) injection  24 mg Intravenous Daily   Followed by  . [START ON 06/06/2021] methylPREDNISolone (SOLU-MEDROL) injection  16 mg Intravenous Daily   Followed by  . [START ON 06/11/2021] methylPREDNISolone (SOLU-MEDROL) injection  8 mg Intravenous Daily  . pantoprazole  40 mg Oral BID  . sodium chloride flush  10-40 mL Intracatheter Q12H   Continuous Infusions: . piperacillin-tazobactam (ZOSYN)  IV 3.375 g (06/02/21 0537)  . TPN ADULT (ION) 35 mL/hr at 06/01/21 2157   PRN Meds:.liver oil-zinc oxide, morphine injection, ondansetron (ZOFRAN) IV, sodium chloride flush  SUBJECTIVE:   24 hour events:  Status post EGD with GI yesterday: Found to have erythematous mucosa in the antrum and duodenitis.  No specimens collected.  No interventions undertaken. Status post IVC filter placement Status post PICC line placement for TPN Afebrile Stable BP Remains on 2 L nasal cannula Hemoglobin 6.7 and receiving PRBCs No micro No new imaging  No new complaints this morning.  Afebrile and tolerating piperacillin tazobactam.  She is currently tolerating clear liquids without significant nausea or abdominal pain.  She has not advance beyond this.  She tolerated all her procedures without issues yesterday.  Review of Systems  All other systems reviewed and are negative.    OBJECTIVE:   Blood pressure 128/85, pulse 81, temperature 97.6 F (36.4 C), temperature source Oral, resp. rate 16, height 5\' 2"  (1.575 m), weight 109.1 kg, SpO2 98 %. Body mass index is 43.99 kg/m.  Physical Exam Constitutional:       Appearance: Normal appearance. She is obese.  HENT:     Head: Normocephalic and atraumatic.  Eyes:     Extraocular Movements: Extraocular movements intact.     Conjunctiva/sclera: Conjunctivae normal.     Comments: Bilateral exophthalmos noted.  Pulmonary:     Effort: Pulmonary effort is normal. No respiratory distress.     Comments: On nasal cannula Abdominal:     Palpations: Abdomen is soft.     Tenderness: There is no abdominal tenderness. There is no guarding or rebound.  Musculoskeletal:     Cervical back: Normal range of motion and neck supple.     Comments: Upper extremity PICC line in place  Skin:    General: Skin is warm and dry.     Findings: No rash.  Neurological:     General: No focal deficit present.     Mental Status: She is alert and oriented to person, place, and time.  Psychiatric:        Mood and Affect: Mood normal.        Behavior: Behavior normal.     Lab Results: Lab Results  Component Value Date   WBC 12.9 (H) 06/01/2021   HGB 6.7 (LL) 06/02/2021   HCT 21.4 (L) 06/02/2021   MCV 96.6 06/01/2021   PLT 154 06/01/2021    Lab Results  Component Value Date   NA 139 06/01/2021   K 4.2 06/01/2021   CO2 25 06/01/2021   GLUCOSE 107 (H) 06/01/2021   BUN 30 (H) 06/01/2021   CREATININE 1.16 (H) 06/01/2021   CALCIUM 8.3 (L) 06/01/2021   GFRNONAA 51 (L) 06/01/2021   GFRAA 61 11/07/2020    Lab Results  Component Value Date   ALT 19 06/01/2021   AST 16 06/01/2021   ALKPHOS 99 06/01/2021   BILITOT 0.7 06/01/2021    No results found for: CRP  No results found for: ESRSEDRATE   I have reviewed the micro and lab results in Epic.  Imaging: IR IVC FILTER PLMT / S&I 06/03/2021 GUID/MOD SED  Result Date: 06/01/2021 INDICATION: PE/DVT.  Acute GI bleed. EXAM: Retrievable IVC filter placement MEDICATIONS: None. ANESTHESIA/SEDATION: One mg IV Versed; 25 mcg IV Fentanyl Moderate Sedation Time:  10 minutes The patient was continuously monitored during the  procedure by the interventional radiology nurse under my direct supervision. FLUOROSCOPY TIME:  Fluoroscopy Time: 0 minutes 48 seconds (76 mGy). COMPLICATIONS: None immediate. PROCEDURE: Informed written consent was obtained from the patient after a thorough discussion of the procedural risks, benefits and alternatives. All questions were addressed. Maximal Sterile Barrier Technique was utilized including caps, mask, sterile gowns, sterile gloves, sterile drape, hand hygiene and skin antiseptic. A timeout was performed prior to the initiation of the procedure. Patient positioned supine on the angiography table. Right neck prepped and draped in usual sterile fashion. All elements of maximal sterile barrier were utilized including, cap, mask, sterile gown, sterile gloves, large sterile drape, hand scrubbing and 2% Chlorhexidine for skin cleaning. The right internal jugular vein was evaluated with ultrasound and shown to be patent. A permanent ultrasound image was obtained and placed in the patient's medical record. Using sterile gel  and a sterile probe cover, the right internal jugular vein was entered with a 21 ga needle during real time ultrasound guidance. 21 gauge needle exchanged for a transitional dilator set over 0.018 inch guidewire. Transitional dilator set exchanged for 10 fr sheath over 0.035 inch guidewire. Sheath placed to the infrarenal IVC and CO2 venogram performed to delineate the position of the renal veins. Retrievable Denali IVC filter deployed in the infrarenal location. Sheath removed and hemostasis achieved with manual compression. IMPRESSION: Retrievable IVC filter placement as above. PLAN: This IVC filter is potentially retrievable. The patient will be assessed for filter retrieval by Interventional Radiology in approximately 8-12 weeks. Further recommendations regarding filter retrieval, continued surveillance or declaration of device permanence, will be made at that time. Electronically  Signed   By: Acquanetta BellingFarhaan  Mir M.D.   On: 06/01/2021 16:42   IR US Guide Vasc Access Right  Result Date: 06/01/2021 INDICATION: PE/DVT.  Acute GI bleed. EXAM: Retrievable IVC filter placement MEDICATIONS: None. ANESTHESIA/SEDATION: One mg IV Versed; 25 mcg IV Fentanyl Moderate Sedation Time:  10 minutes The patient was continuously monitored during the procedure by the interventional radiology nurse under my direct supervision. FLUOROSCOPY TIME:  Fluoroscopy Time: 0 minutes 48 seconds (76 mGy). COMPLICATIONS: None immediate. PROCEDURE: Informed written consent was obtained from the patient after a thorough discussion of the procedural risks, benefits and alternatives. All questions were addressed. Maximal Sterile Barrier Technique was utilized including caps, mask, sterile gowns, sterile gloves, sterile drape, hand hygiene and skin antiseptic. A timeout was performed prior to the initiation of the procedure. Patient positioned supine on the angiography table. Right neck prepped and draped in usual sterile fashion. All elements of maximal sterile barrier were utilized including, cap, mask, sterile gown, sterile gloves, large sterile drape, hand scrubbing and 2% Chlorhexidine for skin cleaning. The right internal jugular vein was evaluated with ultrasound and shown to be patent. A permanent ultrasound image was obtained and placed in the patient's medical record. Using sterile gel and a sterile probe cover, the right internal jugular vein was entered with a 21 ga needle during real time ultrasound guidance. 21 gauge needle exchanged for a transitional dilator set over 0.018 inch guidewire. Transitional dilator set exchanged for 10 fr sheath over 0.035 inch guidewire. Sheath placed to the infrarenal IVC and CO2 venogram performed to delineate the position of the renal veins. Retrievable Denali IVC filter deployed in the infrarenal location. Sheath removed and hemostasis achieved with manual compression. IMPRESSION:  Retrievable IVC filter placement as above. PLAN: This IVC filter is potentially retrievable. The patient will be assessed for filter retrieval by Interventional Radiology in approximately 8-12 weeks. Further recommendations regarding filter retrieval, continued surveillance or declaration of device permanence, will be made at that time. Electronically Signed   By: Acquanetta BellingFarhaan  Mir M.D.   On: 06/01/2021 16:42   VAS US LOWER EXTREMITY VENOUS (DVT)  Result Date: 05/31/2021  Lower Venous DVT Study Patient Name:  [name]  Date of Exam:   05/31/2021 Medical Rec #: [id number]           Accession #:    [id number] Date of Birth: [birth year]          Patient Gender: F Patient Age:   69Y [facility] Procedure:      VAS US LOWER EXTREMITY VENOUS (DVT) Referring Phys: [id number] RALPH A NETTEY --------------------------------------------------------------------------------  Indications: Pulmonary embolism.  Risk Factors: Immobility. Limitations: Body habitus, poor ultrasound/tissue interface and Patient positioning. Comparison  Study: No previous exams Performing Technologist: Jody Hill RVT, RDMS  Examination Guidelines: A complete evaluation includes B-mode imaging, spectral Doppler, color Doppler, and power Doppler as needed of all accessible portions of each vessel. Bilateral testing is considered an integral part of a complete examination. Limited examinations for reoccurring indications may be performed as noted. The reflux portion of the exam is performed with the patient in reverse Trendelenburg.  +---------+---------------+---------+-----------+----------+-------------------+ RIGHT    CompressibilityPhasicitySpontaneityPropertiesThrombus Aging      +---------+---------------+---------+-----------+----------+-------------------+ CFV      Full           Yes      Yes                                      +---------+---------------+---------+-----------+----------+-------------------+  SFJ      Full                                                             +---------+---------------+---------+-----------+----------+-------------------+ FV Prox  Full           Yes      Yes                                      +---------+---------------+---------+-----------+----------+-------------------+ FV Mid   Full           Yes      Yes                                      +---------+---------------+---------+-----------+----------+-------------------+ FV DistalFull           Yes      Yes                                      +---------+---------------+---------+-----------+----------+-------------------+ PFV      Full                                                             +---------+---------------+---------+-----------+----------+-------------------+ POP                     Yes      Yes                  Patent by color and                                                       doppler             +---------+---------------+---------+-----------+----------+-------------------+ PTV      Full                                                             +---------+---------------+---------+-----------+----------+-------------------+  PERO                                                  not visualized on                                                         this exam           +---------+---------------+---------+-----------+----------+-------------------+   Right Technical Findings: Right leg not evaluated. Not visualized segments include peroneal veins.  +---------+---------------+---------+-----------+----------+-------------------+ LEFT     CompressibilityPhasicitySpontaneityPropertiesThrombus Aging      +---------+---------------+---------+-----------+----------+-------------------+ CFV      Full           Yes      Yes                                       +---------+---------------+---------+-----------+----------+-------------------+ SFJ      Full                                                             +---------+---------------+---------+-----------+----------+-------------------+ FV Prox  Full           Yes      Yes                                      +---------+---------------+---------+-----------+----------+-------------------+ FV Mid   Full           Yes      Yes                                      +---------+---------------+---------+-----------+----------+-------------------+ FV DistalFull           Yes      Yes                                      +---------+---------------+---------+-----------+----------+-------------------+ PFV      Full                                                             +---------+---------------+---------+-----------+----------+-------------------+ POP      None           No       No                   Acute               +---------+---------------+---------+-----------+----------+-------------------+ PTV      None  No       No                   Acute               +---------+---------------+---------+-----------+----------+-------------------+ PERO                                                  not visualized on                                                         this exam           +---------+---------------+---------+-----------+----------+-------------------+   Left Technical Findings: Left leg not evaluated. Not visualized segments include peroneal veins.   Summary: BILATERAL: - No evidence of superficial venous thrombosis in the lower extremities, bilaterally. -No evidence of popliteal cyst, bilaterally. RIGHT: - There is no evidence of deep vein thrombosis in the lower extremity.  LEFT: - Findings consistent with acute deep vein thrombosis involving the left popliteal vein, and left posterior tibial veins.  *See table(s) above for  measurements and observations. Electronically signed by Coral Else MD on 05/31/2021 at 7:48:20 PM.    Final    [place] EKG SITE RITE  Result Date: 05/31/2021 If Site Rite image not attached, placement could not be confirmed due to current cardiac rhythm.    Imaging independently reviewed in Epic.    [facility] [phone] pager 06/02/2021, 7:53 AM

## 2021-06-02 NOTE — Telephone Encounter (Signed)
RN returned call to patients phone number and was able to reach her daughter Alphonzo Lemmings ([place]). Daughter states patient is currently hospitalized and has been admitted since last Friday. Patient was found to have a blood clot in her lungs as well as an abscess in her stomach. Per daughter, plan is pending as they are trying to decide if patient will have a JP drain placed on surgery for a colostomy. Daughter does state patient will be returning home with home health and PT when appropriate. RN advised I would update Dr.Nahser. Daughter in agreement.

## 2021-06-02 NOTE — Progress Notes (Signed)
PROGRESS NOTE  [name]  IOX:[id number] DOB: 07/02/1951 DOA: 05/26/2021 PCP: Ollen Bowl, MD   Brief Narrative: [name] is a 70 y.o. female with a history of chronic HFpEF, HTN, OSA, T2DM, morbid obesity, and on prednisone taper for Graves' disease who presented initially 6/10 with abdominal pain found to have perforated diverticulitis and abscess on CT which also incidentally noted bilateral lower and central PE's with flattening LV for which heparin was started. Subsequent echo was limited due to habitus but showed mild RV dilation and systolic dysfunction suggestive of RV strain with severely elevated PASP ( ). Surgery has followed, though not taken to OR yet as patient is high risk for complications. There was no window for abscess drain per IR. Repeat CT is planned.   On 6/15, the patient developed melena with dropping hemoglobin for which anticoagulation was stopped and GI consulted, underwent EGD on 6/16 which did not show any active bleeding.  Assessment & Plan: Principal Problem:   Diverticulitis of colon with perforation Active Problems:   Chronic diastolic CHF (congestive heart failure) (HCC)   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 (HCC)   Moderate persistent asthma, uncomplicated   Seizure (HCC)   Bilateral pulmonary embolism (HCC)  Acute blood loss anemia due to suspected upper GI bleed provoked by anticoagulation:  - Hgb 12.3 last week, 11.7 on admission, continues to drop.  She has consented to receiving blood products. - EGD negative for any acute bleeding.  Hemoglobin dropped requiring blood transfusion again on night of 6/17.  Currently can resume anticoagulation per GI but Hold heparin until hemoglobin stabilizes.  Acute hypoxic respiratory failure due to bilateral PE with RV strain, pulmonary HTN:  - Continue supplemental oxygen if needed.  - Previously discussed with CCM who recommended against lytics. Will restart heparin  ASAP, hopefully once H&H stabilizes. Underwent IVC filter placement on 6/16.  Perforated diverticulitis with abscess: Abd exam fortunately without peritonitis. - Continue zosyn per ID. No blood cultures drawn. WBC trending up, afebrile. - D/w general surgery at bedside.  We will advance her diet but if the patient continues to worsen leukocytosis or worsening abdominal pain or minimal oral intake then may consider reimaging versus surgery.  Uncontrolled T2DM:  - Continue lantus 12u daily and q4h SSI.   Hypothyroidism, Graves' disease: Exophthalmos noted. - Continue steroid taper for this indication (does continue putting patient at risk of stress ulcer which is receiving SUP and steroid-induced hyperglycemia). D/w pharmacy, will provide this via IV for now.   Chronic HFpEF: Appears euvolemic at this time. Severe concentric LVH on echo. LVEF 65-70% - Monitor volume status closely  Stage IIIa CKD: CrCl 68ml/min currently.  Thrombocytopenia: Resolved.  Morbid obesity: Estimated body mass index is 43.99 kg/m as calculated from the following:   Height as of this encounter: 5\' 2"  (1.575 m).   Weight as of this encounter: 109.1 kg.  DVT prophylaxis: Off heparin currently Code Status: Full Family Communication: Daughter at bedside Disposition Plan:  Status is: Inpatient  Remains inpatient appropriate because:Ongoing diagnostic testing needed not appropriate for outpatient work up, IV treatments appropriate due to intensity of illness or inability to take PO, and Inpatient level of care appropriate due to severity of illness  Dispo: The patient is from: Home              Anticipated d/c is to:  TBD              Patient currently is not medically stable to  d/c.   Difficult to place patient No  Consultants:  PCCM GI General surgery ID  Procedures:  EGD 06/01/2021 Dr. Loreta Ave pending  Antimicrobials: Cefepime, flagyl 6/10 Zosyn 6/10 >>   Subjective: Does not have any chest pain or  shortness of breath On oxygen.  No nausea no vomiting.  Continues to have some abdominal pain.  Objective: Vitals:   06/02/21 0443 06/02/21 0711 06/02/21 1131 06/02/21 1542  BP: 95/68 128/85 119/76 121/78  Pulse: 83 81 74 75  Resp: 16   18  Temp: 97.8 F (36.6 C) 97.6 F (36.4 C) 97.6 F (36.4 C) 97.8 F (36.6 C)  TempSrc: Oral Oral Oral Oral  SpO2: 93% 98% 97% 98%  Weight:      Height:        Intake/Output Summary (Last 24 hours) at 06/02/2021 1818 Last data filed at 06/02/2021 1618 Gross per 24 hour  Intake 1800.2 ml  Output 1100 ml  Net 700.2 ml   Filed Weights   05/27/21 0041  Weight: 109.1 kg   General: Appear in mild distress, no Rash; Oral Mucosa Clear, moist. no Abnormal Neck Mass Or lumps, Conjunctiva normal  Cardiovascular: S1 and S2 Present, no Murmur, Respiratory: good respiratory effort, Bilateral Air entry present and bilateral crackles, no wheezes Abdomen: Bowel Sound present, Soft and no tenderness Extremities: no Pedal edema Neurology: alert and oriented to time, place, and person affect appropriate. no new focal deficit Gait not checked due to patient safety concerns   Data Reviewed: I have personally reviewed following labs and imaging studies  CBC: Recent Labs  Lab 05/29/21 0027 05/30/21 0208 05/30/21 1736 05/31/21 0221 05/31/21 1103 06/01/21 0503 06/01/21 0912 06/01/21 1413 06/01/21 1823 06/02/21 0020 06/02/21 0728  WBC 9.1 9.6  --  10.7*  --  12.9*  --   --   --   --  16.5*  NEUTROABS  --   --   --   --   --  9.8*  --   --   --   --   --   HGB 10.6* 11.0*   < > 9.1*   < > 7.2* 7.3* 7.4* 7.0* 6.7* 7.6*  HCT 33.3* 33.8*   < > 27.9*   < > 22.5* 23.4* 24.1* 22.7* 21.4* 23.3*  MCV 96.8 96.0  --  95.9  --  96.6  --   --   --   --  96.7  PLT 88* 111*  --  152  --  154  --   --   --   --  151   < > = values in this interval not displayed.    Basic Metabolic Panel: Recent Labs  Lab 05/28/21 0945 05/29/21 1631 05/30/21 0208  05/31/21 0221 06/01/21 0503 06/02/21 0728  NA 139 136 135 135 139 137  K 2.8* 3.5 3.4* 5.1 4.2 3.8  CL 106 102 103 103 107 106  CO2 22 23 24 23 25  21*  GLUCOSE 137* 195* 230* 310* 107* 272*  BUN 22 13 16  25* 30* 21  CREATININE 1.23* 1.02* 0.96 0.86 1.16* 1.05*  CALCIUM 8.1* 8.0* 8.0* 7.9* 8.3* 8.4*  MG 1.7  --   --   --  1.5* 2.0  PHOS  --   --   --   --  2.5 3.4    GFR: Estimated Creatinine Clearance: 58 mL/min (A) (by C-G formula based on SCr of 1.05 mg/dL (H)). Liver Function Tests: Recent Labs  Lab 05/27/21 0327 06/01/21  0503  AST 13* 16  ALT 23 19  ALKPHOS 52 99  BILITOT 1.0 0.7  PROT 4.4* 4.1*  ALBUMIN 2.2* 1.7*    No results for input(s): LIPASE, AMYLASE in the last 168 hours.  No results for input(s): AMMONIA in the last 168 hours. Coagulation Profile: No results for input(s): INR, PROTIME in the last 168 hours. Cardiac Enzymes: No results for input(s): CKTOTAL, CKMB, CKMBINDEX, TROPONINI in the last 168 hours. BNP (last 3 results) No results for input(s): PROBNP in the last 8760 hours. HbA1C: No results for input(s): HGBA1C in the last 72 hours. CBG: Recent Labs  Lab 06/01/21 2341 06/02/21 0355 06/02/21 0800 06/02/21 1138 06/02/21 1543  GLUCAP 341* 326* 239* 216* 239*    Lipid Profile: Recent Labs    06/01/21 0503  TRIG 108    Thyroid Function Tests: No results for input(s): TSH, T4TOTAL, FREET4, T3FREE, THYROIDAB in the last 72 hours. Anemia Panel: No results for input(s): VITAMINB12, FOLATE, FERRITIN, TIBC, IRON, RETICCTPCT in the last 72 hours. Urine analysis:    Component Value Date/Time   COLORURINE YELLOW 05/26/2021 2115   APPEARANCEUR CLEAR 05/26/2021 2115   LABSPEC 1.017 05/26/2021 2115   PHURINE 5.0 05/26/2021 2115   GLUCOSEU 50 (A) 05/26/2021 2115   GLUCOSEU NEGATIVE 07/13/2020 0900   HGBUR SMALL (A) 05/26/2021 2115   BILIRUBINUR NEGATIVE 05/26/2021 2115   KETONESUR NEGATIVE 05/26/2021 2115   PROTEINUR NEGATIVE 05/26/2021  2115   UROBILINOGEN 0.2 07/13/2020 0900   NITRITE NEGATIVE 05/26/2021 2115   LEUKOCYTESUR TRACE (A) 05/26/2021 2115   Recent Results (from the past 240 hour(s))  Urine culture     Status: Abnormal   Collection Time: 05/23/21  7:44 PM   Specimen: Urine, Random  Result Value Ref Range Status   Specimen Description   Final    URINE, RANDOM Performed at [facility], [address] [place], [place] [address]    Special Requests   Final    NONE Performed at [facility], [address] [place], [place] [address]    Culture MULTIPLE SPECIES PRESENT, SUGGEST RECOLLECTION (A)  Final   Report Status 05/25/2021 FINAL  Final  Gastrointestinal Panel by PCR , Stool     Status: None   Collection Time: 05/26/21  4:59 PM   Specimen: Per Rectum; Stool  Result Value Ref Range Status   Campylobacter species NOT DETECTED NOT DETECTED Final   Plesimonas shigelloides NOT DETECTED NOT DETECTED Final   Salmonella species NOT DETECTED NOT DETECTED Final   Yersinia enterocolitica NOT DETECTED NOT DETECTED Final   Vibrio species NOT DETECTED NOT DETECTED Final   Vibrio cholerae NOT DETECTED NOT DETECTED Final   Enteroaggregative E coli (EAEC) NOT DETECTED NOT DETECTED Final   Enteropathogenic E coli (EPEC) NOT DETECTED NOT DETECTED Final   Enterotoxigenic E coli (ETEC) NOT DETECTED NOT DETECTED Final   Shiga like toxin producing E coli (STEC) NOT DETECTED NOT DETECTED Final   Shigella/Enteroinvasive E coli (EIEC) NOT DETECTED NOT DETECTED Final   Cryptosporidium NOT DETECTED NOT DETECTED Final   Cyclospora cayetanensis NOT DETECTED NOT DETECTED Final   Entamoeba histolytica NOT DETECTED NOT DETECTED Final   Giardia lamblia NOT DETECTED NOT DETECTED Final   Adenovirus F40/41 NOT DETECTED NOT DETECTED Final   Astrovirus NOT DETECTED NOT DETECTED Final   Norovirus GI/GII NOT DETECTED NOT DETECTED Final   Rotavirus A NOT DETECTED NOT DETECTED Final   Sapovirus (I,  II, IV, and V) NOT DETECTED  NOT DETECTED Final    Comment: Performed at [facility], [address] [place], [place] [address]  Resp Panel by RT-PCR (Flu A&B, Covid) Nasopharyngeal Swab     Status: None   Collection Time: 05/26/21  5:19 PM   Specimen: Nasopharyngeal Swab; Nasopharyngeal(NP) swabs in vial transport medium  Result Value Ref Range Status   SARS Coronavirus 2 by RT PCR NEGATIVE NEGATIVE Final    Comment: (NOTE) SARS-CoV-2 target nucleic acids are NOT DETECTED.  The SARS-CoV-2 RNA is generally detectable in upper respiratory specimens during the acute phase of infection. The lowest concentration of SARS-CoV-2 viral copies this assay can detect is 138 copies/mL. A negative result does not preclude SARS-Cov-2 infection and should not be used as the sole basis for treatment or other patient management decisions. A negative result may occur with  improper specimen collection/handling, submission of specimen other than nasopharyngeal swab, presence of viral mutation(s) within the areas targeted by this assay, and inadequate number of viral copies(<138 copies/mL). A negative result must be combined with clinical observations, patient history, and epidemiological information. The expected result is Negative.  Fact Sheet for Patients:  BloggerCourse.com  Fact Sheet for Healthcare Providers:  SeriousBroker.it  This test is no t yet approved or cleared by the [place] FDA and  has been authorized for detection and/or diagnosis of SARS-CoV-2 by FDA under an Emergency Use Authorization (EUA). This EUA will remain  in effect (meaning this test can be used) for the duration of the COVID-19 declaration under Section 564(b)(1) of the Act, 21 U.S.C.section 360bbb-3(b)(1), unless the authorization is terminated  or revoked sooner.       Influenza A by PCR NEGATIVE NEGATIVE Final   Influenza B by PCR NEGATIVE NEGATIVE  Final    Comment: (NOTE) The Xpert Xpress SARS-CoV-2/FLU/RSV plus assay is intended as an aid in the diagnosis of influenza from Nasopharyngeal swab specimens and should not be used as a sole basis for treatment. Nasal washings and aspirates are unacceptable for Xpert Xpress SARS-CoV-2/FLU/RSV testing.  Fact Sheet for Patients: BloggerCourse.com  Fact Sheet for Healthcare Providers: SeriousBroker.it  This test is not yet approved or cleared by the [place] FDA and has been authorized for detection and/or diagnosis of SARS-CoV-2 by FDA under an Emergency Use Authorization (EUA). This EUA will remain in effect (meaning this test can be used) for the duration of the COVID-19 declaration under Section 564(b)(1) of the Act, 21 U.S.C. section 360bbb-3(b)(1), unless the authorization is terminated or revoked.  Performed at [facility], [address], [place], [place] [address]   Surgical PCR screen     Status: None   Collection Time: 05/27/21  1:29 AM   Specimen: Nasal Mucosa; Nasal Swab  Result Value Ref Range Status   MRSA, PCR NEGATIVE NEGATIVE Final   Staphylococcus aureus NEGATIVE NEGATIVE Final    Comment: (NOTE) The Xpert SA Assay (FDA approved for NASAL specimens in patients 49 years of age and older), is one component of a comprehensive surveillance program. It is not intended to diagnose infection nor to guide or monitor treatment. Performed at [facility], [address], [place], [place] [address]   C Difficile Quick Screen w PCR reflex     Status: None   Collection Time: 05/30/21  4:57 PM   Specimen: Per Rectum; Stool  Result Value Ref Range Status   C Diff antigen NEGATIVE NEGATIVE Final   C Diff toxin NEGATIVE NEGATIVE Final   C  Diff interpretation No C. difficile detected.  Final    Comment: Performed at [facility], [address] [place], [place] [address]      Radiology  Studies: IR IVC FILTER PLMT / S&I Lenise Arena GUID/MOD SED  Result Date: 06/01/2021 INDICATION: PE/DVT.  Acute GI bleed. EXAM: Retrievable IVC filter placement MEDICATIONS: None. ANESTHESIA/SEDATION: One mg IV Versed; 25 mcg IV Fentanyl Moderate Sedation Time:  10 minutes The patient was continuously monitored during the procedure by the interventional radiology nurse under my direct supervision. FLUOROSCOPY TIME:  Fluoroscopy Time: 0 minutes 48 seconds (76 mGy). COMPLICATIONS: None immediate. PROCEDURE: Informed written consent was obtained from the patient after a thorough discussion of the procedural risks, benefits and alternatives. All questions were addressed. Maximal Sterile Barrier Technique was utilized including caps, mask, sterile gowns, sterile gloves, sterile drape, hand hygiene and skin antiseptic. A timeout was performed prior to the initiation of the procedure. Patient positioned supine on the angiography table. Right neck prepped and draped in usual sterile fashion. All elements of maximal sterile barrier were utilized including, cap, mask, sterile gown, sterile gloves, large sterile drape, hand scrubbing and 2% Chlorhexidine for skin cleaning. The right internal jugular vein was evaluated with ultrasound and shown to be patent. A permanent ultrasound image was obtained and placed in the patient's medical record. Using sterile gel and a sterile probe cover, the right internal jugular vein was entered with a 21 ga needle during real time ultrasound guidance. 21 gauge needle exchanged for a transitional dilator set over 0.018 inch guidewire. Transitional dilator set exchanged for 10 fr sheath over 0.035 inch guidewire. Sheath placed to the infrarenal IVC and CO2 venogram performed to delineate the position of the renal veins. Retrievable Denali IVC filter deployed in the infrarenal location. Sheath removed and hemostasis achieved with manual compression. IMPRESSION: Retrievable IVC filter placement as  above. PLAN: This IVC filter is potentially retrievable. The patient will be assessed for filter retrieval by Interventional Radiology in approximately 8-12 weeks. Further recommendations regarding filter retrieval, continued surveillance or declaration of device permanence, will be made at that time. Electronically Signed   By: Acquanetta Belling M.D.   On: 06/01/2021 16:42   IR US Guide Vasc Access Right  Result Date: 06/01/2021 INDICATION: PE/DVT.  Acute GI bleed. EXAM: Retrievable IVC filter placement MEDICATIONS: None. ANESTHESIA/SEDATION: One mg IV Versed; 25 mcg IV Fentanyl Moderate Sedation Time:  10 minutes The patient was continuously monitored during the procedure by the interventional radiology nurse under my direct supervision. FLUOROSCOPY TIME:  Fluoroscopy Time: 0 minutes 48 seconds (76 mGy). COMPLICATIONS: None immediate. PROCEDURE: Informed written consent was obtained from the patient after a thorough discussion of the procedural risks, benefits and alternatives. All questions were addressed. Maximal Sterile Barrier Technique was utilized including caps, mask, sterile gowns, sterile gloves, sterile drape, hand hygiene and skin antiseptic. A timeout was performed prior to the initiation of the procedure. Patient positioned supine on the angiography table. Right neck prepped and draped in usual sterile fashion. All elements of maximal sterile barrier were utilized including, cap, mask, sterile gown, sterile gloves, large sterile drape, hand scrubbing and 2% Chlorhexidine for skin cleaning. The right internal jugular vein was evaluated with ultrasound and shown to be patent. A permanent ultrasound image was obtained and placed in the patient's medical record. Using sterile gel and a sterile probe cover, the right internal jugular vein was entered with a 21 ga needle during real time ultrasound guidance. 21 gauge needle exchanged  for a transitional dilator set over 0.018 inch guidewire. Transitional  dilator set exchanged for 10 fr sheath over 0.035 inch guidewire. Sheath placed to the infrarenal IVC and CO2 venogram performed to delineate the position of the renal veins. Retrievable Denali IVC filter deployed in the infrarenal location. Sheath removed and hemostasis achieved with manual compression. IMPRESSION: Retrievable IVC filter placement as above. PLAN: This IVC filter is potentially retrievable. The patient will be assessed for filter retrieval by Interventional Radiology in approximately 8-12 weeks. Further recommendations regarding filter retrieval, continued surveillance or declaration of device permanence, will be made at that time. Electronically Signed   By: Acquanetta Belling M.D.   On: 06/01/2021 16:42    Scheduled Meds:  sodium chloride   Intravenous Once   Chlorhexidine Gluconate Cloth  6 each Topical Daily   feeding supplement  1 Container Oral TID BM   insulin aspart  0-20 Units Subcutaneous Q4H   insulin glargine  12 Units Subcutaneous Daily   levothyroxine  112 mcg Oral Once per day on Mon Tue Wed Thu Fri Sat   And   [START ON 06/04/2021] levothyroxine  56 mcg Oral Once per day on Sun   methylPREDNISolone (SOLU-MEDROL) injection  24 mg Intravenous Daily   Followed by   Melene Muller ON 06/06/2021] methylPREDNISolone (SOLU-MEDROL) injection  16 mg Intravenous Daily   Followed by   Melene Muller ON 06/11/2021] methylPREDNISolone (SOLU-MEDROL) injection  8 mg Intravenous Daily   pantoprazole  40 mg Oral BID   sodium chloride flush  10-40 mL Intracatheter Q12H   Continuous Infusions:  piperacillin-tazobactam (ZOSYN)  IV 3.375 g (06/02/21 1224)   TPN ADULT (ION) 35 mL/hr at 06/02/21 1743     LOS: 7 days   Time spent: 35 minutes.  Lynden Oxford, MD [facility] [email or web address] 06/02/2021, 6:18 PM

## 2021-06-02 NOTE — Anesthesia Postprocedure Evaluation (Signed)
Anesthesia Post Note  Patient: [name]  Procedure(s) Performed: ESOPHAGOGASTRODUODENOSCOPY (EGD) WITH PROPOFOL     Patient location during evaluation: Endoscopy Anesthesia Type: MAC Level of consciousness: awake and alert Pain management: pain level controlled Vital Signs Assessment: post-procedure vital signs reviewed and stable Respiratory status: spontaneous breathing, nonlabored ventilation, respiratory function stable and patient connected to nasal cannula oxygen Cardiovascular status: blood pressure returned to baseline and stable Postop Assessment: no apparent nausea or vomiting Anesthetic complications: no   No notable events documented.  Last Vitals:  Vitals:   06/02/21 0443 06/02/21 0711  BP: 95/68 128/85  Pulse: 83 81  Resp: 16   Temp: 36.6 C 36.4 C  SpO2: 93% 98%    Last Pain:  Vitals:   06/02/21 0711  TempSrc: Oral  PainSc:                  [name] L Blanch Stang

## 2021-06-02 NOTE — Progress Notes (Signed)
General Surgery Follow Up Note  Subjective:    Overnight Issues: Hgb 6.7 overnight and pt transfused 1 u pRBC >> hgb 7.6 This AM she reports some abdominal pain after drinking broth and some ensure. Denies nausea or vomiting. Having BMs.  Objective:  Vital signs for last 24 hours: Temp:  [97.4 F (36.3 C)-98.8 F (37.1 C)] 97.6 F (36.4 C) (06/17 0711) Pulse Rate:  [81-105] 81 (06/17 0711) Resp:  [13-25] 16 (06/17 0443) BP: (87-128)/(40-92) 128/85 (06/17 0711) SpO2:  [93 %-100 %] 98 % (06/17 0711)     Intake/Output from previous day: 06/16 0701 - 06/17 0700 In: 1214 [I.V.:418.4; Blood:377.1; IV Piggyback:418.6] Out: -   Intake/Output this shift: Total I/O In: -  Out: 650 [Urine:650]   Physical Exam:  Gen: comfortable, no distress Neuro: non-focal exam, f/c HEENT: PERRL Neck: supple CV: RRR Pulm: normal effort on [place] Abd: soft, obese, NT GU: clear yellow urine Extr: wwp, +LE edema   Results for orders placed or performed during the hospital encounter of 05/26/21 (from the past 24 hour(s))  Glucose, capillary     Status: Abnormal   Collection Time: 06/01/21 11:06 AM  Result Value Ref Range   Glucose-Capillary 126 (H) 70 - 99 mg/dL  Hemoglobin and hematocrit, blood     Status: Abnormal   Collection Time: 06/01/21  2:13 PM  Result Value Ref Range   Hemoglobin 7.4 (L) 12.0 - 15.0 g/dL   HCT 65.6 (L) 81.2 - 75.1 %  Glucose, capillary     Status: Abnormal   Collection Time: 06/01/21  4:54 PM  Result Value Ref Range   Glucose-Capillary 154 (H) 70 - 99 mg/dL  Hemoglobin and hematocrit, blood     Status: Abnormal   Collection Time: 06/01/21  6:23 PM  Result Value Ref Range   Hemoglobin 7.0 (L) 12.0 - 15.0 g/dL   HCT 70.0 (L) 17.4 - 94.4 %  Glucose, capillary     Status: Abnormal   Collection Time: 06/01/21 10:22 PM  Result Value Ref Range   Glucose-Capillary 227 (H) 70 - 99 mg/dL  Glucose, capillary     Status: Abnormal   Collection Time: 06/01/21 11:40 PM   Result Value Ref Range   Glucose-Capillary 314 (H) 70 - 99 mg/dL  Glucose, capillary     Status: Abnormal   Collection Time: 06/01/21 11:41 PM  Result Value Ref Range   Glucose-Capillary 341 (H) 70 - 99 mg/dL  Hemoglobin and hematocrit, blood     Status: Abnormal   Collection Time: 06/02/21 12:20 AM  Result Value Ref Range   Hemoglobin 6.7 (LL) 12.0 - 15.0 g/dL   HCT 96.7 (L) 59.1 - 63.8 %  Prepare RBC (crossmatch)     Status: None   Collection Time: 06/02/21  2:00 AM  Result Value Ref Range   Order Confirmation      ORDER PROCESSED BY BLOOD BANK Performed at [facility], [address] [place], [place] [address]   Glucose, capillary     Status: Abnormal   Collection Time: 06/02/21  3:55 AM  Result Value Ref Range   Glucose-Capillary 326 (H) 70 - 99 mg/dL  CBC     Status: Abnormal   Collection Time: 06/02/21  7:28 AM  Result Value Ref Range   WBC 16.5 (H) 4.0 - 10.5 K/uL   RBC 2.41 (L) 3.87 - 5.11 MIL/uL   Hemoglobin 7.6 (L) 12.0 - 15.0 g/dL   HCT 93.5 (L) 70.1 - 77.9 %  MCV 96.7 80.0 - 100.0 fL   MCH 31.5 26.0 - 34.0 pg   MCHC 32.6 30.0 - 36.0 g/dL   RDW 45.3 (H) 64.6 - 80.3 %   Platelets 151 150 - 400 K/uL   nRBC 1.1 (H) 0.0 - 0.2 %  Basic metabolic panel     Status: Abnormal   Collection Time: 06/02/21  7:28 AM  Result Value Ref Range   Sodium 137 135 - 145 mmol/L   Potassium 3.8 3.5 - 5.1 mmol/L   Chloride 106 98 - 111 mmol/L   CO2 21 (L) 22 - 32 mmol/L   Glucose, Bld 272 (H) 70 - 99 mg/dL   BUN 21 8 - 23 mg/dL   Creatinine, Ser 2.12 (H) 0.44 - 1.00 mg/dL   Calcium 8.4 (L) 8.9 - 10.3 mg/dL   GFR, Estimated 57 (L) >60 mL/min   Anion gap 10 5 - 15  Magnesium     Status: None   Collection Time: 06/02/21  7:28 AM  Result Value Ref Range   Magnesium 2.0 1.7 - 2.4 mg/dL  Phosphorus     Status: None   Collection Time: 06/02/21  7:28 AM  Result Value Ref Range   Phosphorus 3.4 2.5 - 4.6 mg/dL  Glucose, capillary     Status: Abnormal   Collection Time:  06/02/21  8:00 AM  Result Value Ref Range   Glucose-Capillary 239 (H) 70 - 99 mg/dL    Assessment & Plan: The plan of care was discussed with the bedside nurse for the day,  Joni Reining, who is in agreement with this plan and no additional concerns were raised.   Present on Admission:  Diverticulitis of colon with perforation  Chronic diastolic CHF (congestive heart failure) (HCC)  Moderate persistent asthma, uncomplicated    LOS: 7 days   Additional comments:I reviewed the patient's new clinical lab test results.   and I reviewed the patients new imaging test results.     61F Diverticulitis with intra-abdominal abscess (9x5 cm) -afebrile, low blood pressure, WBC 16.5 from 12.5 - IR without a safe window for abscess drainage 6/12 - nontender on exam but having abd pain with PO liquids. - continue IV abx - plan to monitor abd exam on liquid diet and repeat CBC in AM. Likely plan repeat imaging tomorrow 6/18 if not improving ti re-evaluate intraabd abscess and possible window for IR drainage. Given the size of the abscess, it may not fully resolve without IR drainage or surgery. Discussed the near 100% likelihood of colostomy creation at the time of surgery if surgery is required. - No emergent surgical needs today.   - OOB/mobilize with therapies   FEN: CLD, ok to try FLD today if patient desires, though I think she wants to stick with clears. YQ:MGNOI VTE: SCD's, hep gtt on hold Foley: none, external cath Dispo: SDU   - Below per primary service - Bilateral PE-heparin IV held due to hgb requiring transfusion last night, s/p IVCF 6/16 by IR Melena - EGD 6/16 normal esophagus, erythematous mucosa in the antrum, duodenitis but no active bleeding. GI signed off. Acute respiratory failure with hypoxia Thombocytopenia Chronic diastolic heart failure CKD IIIa HTN DM2 Morbid obesity      Hosie Spangle, PA-C Trauma & General Surgery Please use [email or web address] to contact on call  provider  06/02/2021  *Care during the described time interval was provided by me. I have reviewed this patient's available data, including medical history, events of note, physical examination and test  results as part of my evaluation.

## 2021-06-02 NOTE — Progress Notes (Addendum)
PHARMACY - TOTAL PARENTERAL NUTRITION CONSULT NOTE   Indication:  Intolerance to enteral feeding  Patient Measurements: Height: 5\' 2"  (157.5 cm) Weight: 109.1 kg (240 lb 8.4 oz) IBW/kg (Calculated) : 50.1 TPN AdjBW (KG): 64.9 Body mass index is 43.99 kg/m.  Assessment: 70 years of age female with known celiac disease, CHF, HTN, and DM admitted on 05/26/21 with perforated sigmoid diverticulitis and abscess as well as pulmonary embolism. IR unable to find a safe window for abscess drain placement and General Surgery recommending no surgery at this time. Attempted full liquids but had increased abdominal pain and loose stools so backed back down to clear liquids. Currently having bowel movements. Day #6 of hospital stay without adequate intake and patient has had N/V/D for 2 weeks prior to admission.   Daughter reports abdominal pain starting around 2 weeks ago which led to decreased oral intake which has gotten progressively worse leading up to admission. Since abdominal pain started, maybe would eat around 2 times per day.  Daughter reports patient would eat healthy (vegetables, fish, eggs) prior to abdominal pain. Drinks Glucerna shakes throughout the day.  Reports ~30 pound weight loss over the past 6 months. Patient is at risk for refeeding.   Patient started on CLD 6/17 AM- daughter reports she has had a cup of coffee and is fixing to drink broth once it cools.  Patient denies nausea or abdominal pain.  Discussed with [facility]- continue TPN for now and repeat imaging tomorrow.    Glucose / Insulin: DM with uncontrolled CBGs. D/c'd Lantus and added regular insulin 10 units to TPN.  rSSI 40 units used. Restarting steroids as IV while NPO.  Electrolytes: Na 137, K 3.8, Mg 2 (s/p 4g IV yesterday), Phos 3.4 (s/p NaPhos 7/17 IV yest), CoCa 10.2, CO2 21 Renal: SCr 1.05 (improving), BUN 21 Hepatic: LFTs/Tbili wnl, TG 108 Intake / Output; MIVF: UOP not quantified, Last BM 6/15, no MIVF GI Imaging: plan  for repeat CT Thursday or Friday 6/10 CT A/P: perforated diverticulitis w/ large abscess  GI Surgeries / Procedures:  6/16 EGD: normal esophagus, erythematous mucosa, duodenitis, healed ulcer  Central access: Double lumen PICC 6/16 TPN start date: 6/16  Nutritional Goals (per RD recommendation on 05/29/21): kCal: 2000-2200, Protein: 110-120, Fluid: >= 2L Goal TPN 90 ml/hr will provide 115g AA, 63g ILE, and 297g CHO for a total of 2100 kcal/day  Current Nutrition:  Clear liquids and TPN   Plan:  Continue TPN at 68mL/hr at 1800 Electrolytes in TPN: Na 80mEq/L, K 72mEq/L, Ca 46mEq/L, Mg 8/L, and Phos 20mmol/L. Cl:Ac 1:2 Add standard MVI and trace elements to TPN Continue Resistant SSI q4h  Remove regular insulin from TPN bag until insulin needs determined due to Lantus being resumed F/u insulin regimen - per MD  F/u BMET 6/18 - may need to reduce Ca in TPN Monitor TPN labs on Mon/Thurs  7/18, PharmD PGY-1 Acute Care Pharmacy Resident 06/02/2021,7:03 AM  PM UPDATE: -Order for Lantus 8 units QD discontinued  -Discussed w/ Dr. 06/04/2021 - recommended Lantus 10 units BID earlier today and discussed hyperglycemia limiting TPN advancement.  MD said he would address high blood sugars. Will continue to monitor CBG and f/u insulin plan.

## 2021-06-03 ENCOUNTER — Inpatient Hospital Stay (HOSPITAL_COMMUNITY): Payer: Medicare HMO

## 2021-06-03 ENCOUNTER — Encounter (HOSPITAL_COMMUNITY): Payer: Self-pay | Admitting: Gastroenterology

## 2021-06-03 LAB — TYPE AND SCREEN
ABO/RH(D): B POS
Antibody Screen: NEGATIVE
Unit division: 0

## 2021-06-03 LAB — HEMOGLOBIN AND HEMATOCRIT, BLOOD
HCT: 22.1 % — ABNORMAL LOW (ref 36.0–46.0)
HCT: 22.1 % — ABNORMAL LOW (ref 36.0–46.0)
Hemoglobin: 7.1 g/dL — ABNORMAL LOW (ref 12.0–15.0)
Hemoglobin: 7.2 g/dL — ABNORMAL LOW (ref 12.0–15.0)

## 2021-06-03 LAB — BPAM RBC
Blood Product Expiration Date: 202206282359
ISSUE DATE / TIME: 202206170211
Unit Type and Rh: 7300

## 2021-06-03 LAB — CBC
HCT: 21.7 % — ABNORMAL LOW (ref 36.0–46.0)
Hemoglobin: 7.2 g/dL — ABNORMAL LOW (ref 12.0–15.0)
MCH: 31.7 pg (ref 26.0–34.0)
MCHC: 33.2 g/dL (ref 30.0–36.0)
MCV: 95.6 fL (ref 80.0–100.0)
Platelets: 157 10*3/uL (ref 150–400)
RBC: 2.27 MIL/uL — ABNORMAL LOW (ref 3.87–5.11)
RDW: 17.2 % — ABNORMAL HIGH (ref 11.5–15.5)
WBC: 16.3 10*3/uL — ABNORMAL HIGH (ref 4.0–10.5)
nRBC: 1.2 % — ABNORMAL HIGH (ref 0.0–0.2)

## 2021-06-03 LAB — CBC WITH DIFFERENTIAL/PLATELET
Abs Immature Granulocytes: 0.5 10*3/uL — ABNORMAL HIGH (ref 0.00–0.07)
Basophils Absolute: 0 10*3/uL (ref 0.0–0.1)
Basophils Relative: 0 %
Eosinophils Absolute: 0 10*3/uL (ref 0.0–0.5)
Eosinophils Relative: 0 %
HCT: 22.4 % — ABNORMAL LOW (ref 36.0–46.0)
Hemoglobin: 7.3 g/dL — ABNORMAL LOW (ref 12.0–15.0)
Immature Granulocytes: 3 %
Lymphocytes Relative: 8 %
Lymphs Abs: 1.3 10*3/uL (ref 0.7–4.0)
MCH: 31.3 pg (ref 26.0–34.0)
MCHC: 32.6 g/dL (ref 30.0–36.0)
MCV: 96.1 fL (ref 80.0–100.0)
Monocytes Absolute: 0.6 10*3/uL (ref 0.1–1.0)
Monocytes Relative: 4 %
Neutro Abs: 13.1 10*3/uL — ABNORMAL HIGH (ref 1.7–7.7)
Neutrophils Relative %: 85 %
Platelets: 176 10*3/uL (ref 150–400)
RBC: 2.33 MIL/uL — ABNORMAL LOW (ref 3.87–5.11)
RDW: 17.1 % — ABNORMAL HIGH (ref 11.5–15.5)
WBC: 15.5 10*3/uL — ABNORMAL HIGH (ref 4.0–10.5)
nRBC: 1.2 % — ABNORMAL HIGH (ref 0.0–0.2)

## 2021-06-03 LAB — COMPREHENSIVE METABOLIC PANEL
ALT: 19 U/L (ref 0–44)
AST: 14 U/L — ABNORMAL LOW (ref 15–41)
Albumin: 1.9 g/dL — ABNORMAL LOW (ref 3.5–5.0)
Alkaline Phosphatase: 94 U/L (ref 38–126)
Anion gap: 7 (ref 5–15)
BUN: 12 mg/dL (ref 8–23)
CO2: 25 mmol/L (ref 22–32)
Calcium: 8.7 mg/dL — ABNORMAL LOW (ref 8.9–10.3)
Chloride: 105 mmol/L (ref 98–111)
Creatinine, Ser: 0.7 mg/dL (ref 0.44–1.00)
GFR, Estimated: >60 mL/min (ref >60)
Glucose, Bld: 236 mg/dL — ABNORMAL HIGH (ref 70–99)
Potassium: 4.3 mmol/L (ref 3.5–5.1)
Sodium: 137 mmol/L (ref 135–145)
Total Bilirubin: 0.6 mg/dL (ref 0.3–1.2)
Total Protein: 4.6 g/dL — ABNORMAL LOW (ref 6.5–8.1)

## 2021-06-03 LAB — GLUCOSE, CAPILLARY
Glucose-Capillary: 197 mg/dL — ABNORMAL HIGH (ref 70–99)
Glucose-Capillary: 201 mg/dL — ABNORMAL HIGH (ref 70–99)
Glucose-Capillary: 265 mg/dL — ABNORMAL HIGH (ref 70–99)
Glucose-Capillary: 291 mg/dL — ABNORMAL HIGH (ref 70–99)
Glucose-Capillary: 416 mg/dL — ABNORMAL HIGH (ref 70–99)

## 2021-06-03 LAB — PHOSPHORUS: Phosphorus: 2.3 mg/dL — ABNORMAL LOW (ref 2.5–4.6)

## 2021-06-03 LAB — MAGNESIUM: Magnesium: 1.7 mg/dL (ref 1.7–2.4)

## 2021-06-03 MED ORDER — INSULIN ASPART 100 UNIT/ML IJ SOLN
22.0000 [IU] | Freq: Once | INTRAMUSCULAR | Status: AC
  Administered 2021-06-03: 22 [IU] via SUBCUTANEOUS

## 2021-06-03 MED ORDER — TRAVASOL 10 % IV SOLN
INTRAVENOUS | Status: AC
  Filled 2021-06-03: qty 445.2

## 2021-06-03 MED ORDER — K PHOS MONO-SOD PHOS DI & MONO 155-852-130 MG PO TABS
250.0000 mg | ORAL_TABLET | Freq: Once | ORAL | Status: AC
  Administered 2021-06-03: 250 mg via ORAL
  Filled 2021-06-03: qty 1

## 2021-06-03 MED ORDER — IOHEXOL 300 MG/ML  SOLN
100.0000 mL | Freq: Once | INTRAMUSCULAR | Status: AC | PRN
  Administered 2021-06-03: 100 mL via INTRAVENOUS

## 2021-06-03 NOTE — Progress Notes (Signed)
PROGRESS NOTE    [name]  SWF:[id number] DOB: 1951-06-14 DOA: 05/26/2021 PCP: Ollen Bowl, MD   Brief Narrative:   [name] is a 70 y.o. female with a history of chronic HFpEF, HTN, OSA, T2DM, morbid obesity, and on prednisone taper for Graves' disease who presented initially 6/10 with abdominal pain found to have perforated diverticulitis and abscess on CT which also incidentally noted bilateral lower and central PE's with flattening LV for which heparin was started. Subsequent echo was limited due to habitus but showed mild RV dilation and systolic dysfunction suggestive of RV strain with severely elevated PASP ( ). Surgery has followed, though not taken to OR yet as patient is high risk for complications. There was no window for abscess drain per IR. Repeat CT was planned. On 6/15, the patient developed melena with dropping hemoglobin for which anticoagulation was stopped and GI consulted, underwent EGD on 6/16 which did not show any active bleeding.  6/18: Denies complains ON. Hgb is trending down this morning. Will hold off today on resuming anticoagulation. Follow q8 H&H.    Assessment & Plan: Acute blood loss anemia due to suspected upper GI bleed provoked by anticoagulation:     - Hgb 12.3 last week, 11.7 on admission, continues to drop.     - She has consented to receiving blood products.     - EGD negative for any acute bleeding.     - Hemoglobin dropped requiring blood transfusion again on night of 6/17.  Currently can resume anticoagulation per GI but Hold heparin until hemoglobin stabilizes.     - 6/18:Hgb down to 7.2; hold heparin for today, continue to monitor H&H   Acute hypoxic respiratory failure due to bilateral PE with RV strain, pulmonary HTN:     - Continue supplemental oxygen if needed.     - Previously discussed with CCM who recommended against lytics. Will restart heparin ASAP, hopefully once H&H stabilizes.     - Underwent IVC filter  placement on 6/16.   Perforated diverticulitis with abscess     - Abd exam fortunately without peritonitis.     - Continue zosyn per ID. No blood cultures drawn. WBC trending up, afebrile.     - D/w general surgery at bedside.  We will advance her diet but if the patient continues to worsen leukocytosis or worsening abdominal pain or minimal oral intake then may consider reimaging versus surgery.     - 6/18: WBC down a little this morning. She is currently on CLD and tolerating. TPN is also on. Will continue current regimen for now   Uncontrolled T2DM:     - Continue lantus 12u daily and q4h SSI.     - 6/18: glucose this AM 190-200 which is improved from the 220 - 270 from yesterday; can continue current regimen   Hypothyroidism, Graves' disease     - Exophthalmos noted.     - Continue steroid taper for this indication (does continue putting patient at risk of stress ulcer which is receiving SUP and steroid-induced hyperglycemia).      - D/w pharmacy, will provide this via IV for now.   Chronic HFpEF     - Appears euvolemic at this time.      - Severe concentric LVH on echo. LVEF 65-70%     - 6/18: volume status is good this morning   Stage IIIa CKD     - follow renal fxn; watch nephrotoxins   Thrombocytopenia     -  Resolved.   Morbid obesity     - Estimated body mass index is 43.99 kg/m as calculated from the following:       Height as of this encounter: 5\' 2"  (1.575 m).       Weight as of this encounter: 109.1 kg.     - counsel on diet, lifestyle changes; follow up outpt  DVT prophylaxis: held d/t GIB; resume as Hgb stabilizes Code Status: FULL Family Communication: w/ dtr at bedside   Status is: Inpatient  Remains inpatient appropriate because:Inpatient level of care appropriate due to severity of illness  Dispo: The patient is from: Home              Anticipated d/c is to:  TBD              Patient currently is not medically stable to d/c.   Difficult to place patient  No  Consultants:  GI Gen surgery ID PCCM  Procedures:  EGD 06/01/21  Antimicrobials:  Cefepime, flagyl d/c'd sozsyn   Subjective: Denies complaints ON. Says she is feeling better  Objective: Vitals:   06/02/21 1542 06/02/21 1950 06/02/21 2323 06/03/21 0323  BP: 121/78 118/70 126/78 134/85  Pulse: 75 69 68 66  Resp: 18 17 18    Temp: 97.8 F (36.6 C) 97.7 F (36.5 C) 97.6 F (36.4 C) 98.3 F (36.8 C)  TempSrc: Oral Oral Oral Oral  SpO2: 98% 94% 100% 100%  Weight:      Height:        Intake/Output Summary (Last 24 hours) at 06/03/2021 0713 Last data filed at 06/02/2021 1618 Gross per 24 hour  Intake 1040.85 ml  Output 1100 ml  Net -59.15 ml   Filed Weights   05/27/21 0041  Weight: 109.1 kg    Examination:  General: 70 y.o. female resting in bed in NAD Eyes: PERRL, normal sclera ENMT: Nares patent w/o discharge, orophaynx clear, dentition normal, ears w/o discharge/lesions/ulcers Neck: Supple, trachea midline Cardiovascular: RRR, +S1, S2, no m/g/r, equal pulses throughout Respiratory: CTABL, no w/r/r, normal WOB GI: BS+, NDNT, no masses noted, no organomegaly noted MSK: No e/c/c Skin: No rashes, bruises, ulcerations noted Neuro: A&O x 3, no focal deficits Psyc: Appropriate interaction and affect, calm/cooperative   Data Reviewed: I have personally reviewed following labs and imaging studies.  CBC: Recent Labs  Lab 05/31/21 0221 05/31/21 1103 06/01/21 0503 06/01/21 0912 06/01/21 1823 06/02/21 0020 06/02/21 0728 06/02/21 1750 06/03/21 0312  WBC 10.7*  --  12.9*  --   --   --  16.5* 17.9* 16.3*  NEUTROABS  --   --  9.8*  --   --   --   --   --   --   HGB 9.1*   < > 7.2*   < > 7.0* 6.7* 7.6* 8.0* 7.2*  HCT 27.9*   < > 22.5*   < > 22.7* 21.4* 23.3* 24.6* 21.7*  MCV 95.9  --  96.6  --   --   --  96.7 94.6 95.6  PLT 152  --  154  --   --   --  151 144* 157   < > = values in this interval not displayed.   Basic Metabolic Panel: Recent Labs  Lab  05/28/21 0945 05/29/21 1631 05/30/21 0208 05/31/21 0221 06/01/21 0503 06/02/21 0728  NA 139 136 135 135 139 137  K 2.8* 3.5 3.4* 5.1 4.2 3.8  CL 106 102 103 103 107 106  CO2 22  23 24 23 25  21*  GLUCOSE 137* 195* 230* 310* 107* 272*  BUN 22 13 16  25* 30* 21  CREATININE 1.23* 1.02* 0.96 0.86 1.16* 1.05*  CALCIUM 8.1* 8.0* 8.0* 7.9* 8.3* 8.4*  MG 1.7  --   --   --  1.5* 2.0  PHOS  --   --   --   --  2.5 3.4   GFR: Estimated Creatinine Clearance: 58 mL/min (A) (by C-G formula based on SCr of 1.05 mg/dL (H)). Liver Function Tests: Recent Labs  Lab 06/01/21 0503  AST 16  ALT 19  ALKPHOS 99  BILITOT 0.7  PROT 4.1*  ALBUMIN 1.7*   No results for input(s): LIPASE, AMYLASE in the last 168 hours. No results for input(s): AMMONIA in the last 168 hours. Coagulation Profile: No results for input(s): INR, PROTIME in the last 168 hours. Cardiac Enzymes: No results for input(s): CKTOTAL, CKMB, CKMBINDEX, TROPONINI in the last 168 hours. BNP (last 3 results) No results for input(s): PROBNP in the last 8760 hours. HbA1C: No results for input(s): HGBA1C in the last 72 hours. CBG: Recent Labs  Lab 06/02/21 1138 06/02/21 1543 06/02/21 2003 06/02/21 2312 06/03/21 0321  GLUCAP 216* 239* 251* 270* 197*   Lipid Profile: Recent Labs    06/01/21 0503  TRIG 108   Thyroid Function Tests: No results for input(s): TSH, T4TOTAL, FREET4, T3FREE, THYROIDAB in the last 72 hours. Anemia Panel: No results for input(s): VITAMINB12, FOLATE, FERRITIN, TIBC, IRON, RETICCTPCT in the last 72 hours. Sepsis Labs: No results for input(s): PROCALCITON, LATICACIDVEN in the last 168 hours.  Recent Results (from the past 240 hour(s))  Gastrointestinal Panel by PCR , Stool     Status: None   Collection Time: 05/26/21  4:59 PM   Specimen: Per Rectum; Stool  Result Value Ref Range Status   Campylobacter species NOT DETECTED NOT DETECTED Final   Plesimonas shigelloides NOT DETECTED NOT DETECTED Final    Salmonella species NOT DETECTED NOT DETECTED Final   Yersinia enterocolitica NOT DETECTED NOT DETECTED Final   Vibrio species NOT DETECTED NOT DETECTED Final   Vibrio cholerae NOT DETECTED NOT DETECTED Final   Enteroaggregative E coli (EAEC) NOT DETECTED NOT DETECTED Final   Enteropathogenic E coli (EPEC) NOT DETECTED NOT DETECTED Final   Enterotoxigenic E coli (ETEC) NOT DETECTED NOT DETECTED Final   Shiga like toxin producing E coli (STEC) NOT DETECTED NOT DETECTED Final   Shigella/Enteroinvasive E coli (EIEC) NOT DETECTED NOT DETECTED Final   Cryptosporidium NOT DETECTED NOT DETECTED Final   Cyclospora cayetanensis NOT DETECTED NOT DETECTED Final   Entamoeba histolytica NOT DETECTED NOT DETECTED Final   Giardia lamblia NOT DETECTED NOT DETECTED Final   Adenovirus F40/41 NOT DETECTED NOT DETECTED Final   Astrovirus NOT DETECTED NOT DETECTED Final   Norovirus GI/GII NOT DETECTED NOT DETECTED Final   Rotavirus A NOT DETECTED NOT DETECTED Final   Sapovirus (I, II, IV, and V) NOT DETECTED NOT DETECTED Final    Comment: Performed at [facility], [address] [place], [place] [id number]  Resp Panel by RT-PCR (Flu A&B, Covid) Nasopharyngeal Swab     Status: None   Collection Time: 05/26/21  5:19 PM   Specimen: Nasopharyngeal Swab; Nasopharyngeal(NP) swabs in vial transport medium  Result Value Ref Range Status   SARS Coronavirus 2 by RT PCR NEGATIVE NEGATIVE Final    Comment: (NOTE) SARS-CoV-2 target nucleic acids are NOT DETECTED.  The SARS-CoV-2 RNA is generally detectable in upper  respiratory specimens during the acute phase of infection. The lowest concentration of SARS-CoV-2 viral copies this assay can detect is 138 copies/mL. A negative result does not preclude SARS-Cov-2 infection and should not be used as the sole basis for treatment or other patient management decisions. A negative result may occur with  improper specimen collection/handling, submission of  specimen other than nasopharyngeal swab, presence of viral mutation(s) within the areas targeted by this assay, and inadequate number of viral copies(<138 copies/mL). A negative result must be combined with clinical observations, patient history, and epidemiological information. The expected result is Negative.  Fact Sheet for Patients:  BloggerCourse.com  Fact Sheet for Healthcare Providers:  SeriousBroker.it  This test is no t yet approved or cleared by the [place] FDA and  has been authorized for detection and/or diagnosis of SARS-CoV-2 by FDA under an Emergency Use Authorization (EUA). This EUA will remain  in effect (meaning this test can be used) for the duration of the COVID-19 declaration under Section 564(b)(1) of the Act, 21 U.S.C.section 360bbb-3(b)(1), unless the authorization is terminated  or revoked sooner.       Influenza A by PCR NEGATIVE NEGATIVE Final   Influenza B by PCR NEGATIVE NEGATIVE Final    Comment: (NOTE) The Xpert Xpress SARS-CoV-2/FLU/RSV plus assay is intended as an aid in the diagnosis of influenza from Nasopharyngeal swab specimens and should not be used as a sole basis for treatment. Nasal washings and aspirates are unacceptable for Xpert Xpress SARS-CoV-2/FLU/RSV testing.  Fact Sheet for Patients: BloggerCourse.com  Fact Sheet for Healthcare Providers: SeriousBroker.it  This test is not yet approved or cleared by the [place] FDA and has been authorized for detection and/or diagnosis of SARS-CoV-2 by FDA under an Emergency Use Authorization (EUA). This EUA will remain in effect (meaning this test can be used) for the duration of the COVID-19 declaration under Section 564(b)(1) of the Act, 21 U.S.C. section 360bbb-3(b)(1), unless the authorization is terminated or revoked.  Performed at [facility], [address],  [place], [place] [address]   Surgical PCR screen     Status: None   Collection Time: 05/27/21  1:29 AM   Specimen: Nasal Mucosa; Nasal Swab  Result Value Ref Range Status   MRSA, PCR NEGATIVE NEGATIVE Final   Staphylococcus aureus NEGATIVE NEGATIVE Final    Comment: (NOTE) The Xpert SA Assay (FDA approved for NASAL specimens in patients 51 years of age and older), is one component of a comprehensive surveillance program. It is not intended to diagnose infection nor to guide or monitor treatment. Performed at [facility], [address] [place], [place] [address]   C Difficile Quick Screen w PCR reflex     Status: None   Collection Time: 05/30/21  4:57 PM   Specimen: Per Rectum; Stool  Result Value Ref Range Status   C Diff antigen NEGATIVE NEGATIVE Final   C Diff toxin NEGATIVE NEGATIVE Final   C Diff interpretation No C. difficile detected.  Final    Comment: Performed at [facility], [address], [place], [place] [address]      Radiology Studies: IR IVC FILTER PLMT / S&I Lenise Arena GUID/MOD SED  Result Date: 06/01/2021 INDICATION: PE/DVT.  Acute GI bleed. EXAM: Retrievable IVC filter placement MEDICATIONS: None. ANESTHESIA/SEDATION: One mg IV Versed; 25 mcg IV Fentanyl Moderate Sedation Time:  10 minutes The patient was continuously monitored during the procedure by the interventional radiology nurse under my direct supervision. FLUOROSCOPY  TIME:  Fluoroscopy Time: 0 minutes 48 seconds (76 mGy). COMPLICATIONS: None immediate. PROCEDURE: Informed written consent was obtained from the patient after a thorough discussion of the procedural risks, benefits and alternatives. All questions were addressed. Maximal Sterile Barrier Technique was utilized including caps, mask, sterile gowns, sterile gloves, sterile drape, hand hygiene and skin antiseptic. A timeout was performed prior to the initiation of the procedure. Patient positioned supine on the angiography table. Right neck  prepped and draped in usual sterile fashion. All elements of maximal sterile barrier were utilized including, cap, mask, sterile gown, sterile gloves, large sterile drape, hand scrubbing and 2% Chlorhexidine for skin cleaning. The right internal jugular vein was evaluated with ultrasound and shown to be patent. A permanent ultrasound image was obtained and placed in the patient's medical record. Using sterile gel and a sterile probe cover, the right internal jugular vein was entered with a 21 ga needle during real time ultrasound guidance. 21 gauge needle exchanged for a transitional dilator set over 0.018 inch guidewire. Transitional dilator set exchanged for 10 fr sheath over 0.035 inch guidewire. Sheath placed to the infrarenal IVC and CO2 venogram performed to delineate the position of the renal veins. Retrievable Denali IVC filter deployed in the infrarenal location. Sheath removed and hemostasis achieved with manual compression. IMPRESSION: Retrievable IVC filter placement as above. PLAN: This IVC filter is potentially retrievable. The patient will be assessed for filter retrieval by Interventional Radiology in approximately 8-12 weeks. Further recommendations regarding filter retrieval, continued surveillance or declaration of device permanence, will be made at that time. Electronically Signed   By: Acquanetta Belling M.D.   On: 06/01/2021 16:42   IR US Guide Vasc Access Right  Result Date: 06/01/2021 INDICATION: PE/DVT.  Acute GI bleed. EXAM: Retrievable IVC filter placement MEDICATIONS: None. ANESTHESIA/SEDATION: One mg IV Versed; 25 mcg IV Fentanyl Moderate Sedation Time:  10 minutes The patient was continuously monitored during the procedure by the interventional radiology nurse under my direct supervision. FLUOROSCOPY TIME:  Fluoroscopy Time: 0 minutes 48 seconds (76 mGy). COMPLICATIONS: None immediate. PROCEDURE: Informed written consent was obtained from the patient after a thorough discussion of the  procedural risks, benefits and alternatives. All questions were addressed. Maximal Sterile Barrier Technique was utilized including caps, mask, sterile gowns, sterile gloves, sterile drape, hand hygiene and skin antiseptic. A timeout was performed prior to the initiation of the procedure. Patient positioned supine on the angiography table. Right neck prepped and draped in usual sterile fashion. All elements of maximal sterile barrier were utilized including, cap, mask, sterile gown, sterile gloves, large sterile drape, hand scrubbing and 2% Chlorhexidine for skin cleaning. The right internal jugular vein was evaluated with ultrasound and shown to be patent. A permanent ultrasound image was obtained and placed in the patient's medical record. Using sterile gel and a sterile probe cover, the right internal jugular vein was entered with a 21 ga needle during real time ultrasound guidance. 21 gauge needle exchanged for a transitional dilator set over 0.018 inch guidewire. Transitional dilator set exchanged for 10 fr sheath over 0.035 inch guidewire. Sheath placed to the infrarenal IVC and CO2 venogram performed to delineate the position of the renal veins. Retrievable Denali IVC filter deployed in the infrarenal location. Sheath removed and hemostasis achieved with manual compression. IMPRESSION: Retrievable IVC filter placement as above. PLAN: This IVC filter is potentially retrievable. The patient will be assessed for filter retrieval by Interventional Radiology in approximately 8-12 weeks. Further recommendations regarding filter retrieval, continued  surveillance or declaration of device permanence, will be made at that time. Electronically Signed   By: Acquanetta Belling M.D.   On: 06/01/2021 16:42     Scheduled Meds:  sodium chloride   Intravenous Once   Chlorhexidine Gluconate Cloth  6 each Topical Daily   feeding supplement  1 Container Oral TID BM   insulin aspart  0-20 Units Subcutaneous Q4H   insulin  glargine  12 Units Subcutaneous Daily   levothyroxine  112 mcg Oral Once per day on Mon Tue Wed Thu Fri Sat   And   [START ON 06/04/2021] levothyroxine  56 mcg Oral Once per day on Sun   methylPREDNISolone (SOLU-MEDROL) injection  24 mg Intravenous Daily   Followed by   Melene Muller ON 06/06/2021] methylPREDNISolone (SOLU-MEDROL) injection  16 mg Intravenous Daily   Followed by   Melene Muller ON 06/11/2021] methylPREDNISolone (SOLU-MEDROL) injection  8 mg Intravenous Daily   pantoprazole  40 mg Oral BID   sodium chloride flush  10-40 mL Intracatheter Q12H   Continuous Infusions:  piperacillin-tazobactam (ZOSYN)  IV 3.375 g (06/03/21 1308)   TPN ADULT (ION) 35 mL/hr at 06/02/21 1743     LOS: 8 days    Time spent: 25 minutes   Teddy Spike, DO [facility]  If 7PM-7AM, please contact night-coverage [email or web address] 06/03/2021, 7:13 AM

## 2021-06-03 NOTE — Progress Notes (Addendum)
2 Days Post-Op  Subjective: CC: Having some lower abdominal pain this morning. Tolerating CLD without n/v. BM documented yesterday.   Objective: Vital signs in last 24 hours: Temp:  [97.6 F (36.4 C)-98.3 F (36.8 C)] 97.9 F (36.6 C) (06/18 0700) Pulse Rate:  [66-75] 66 (06/18 0323) Resp:  [17-18] 18 (06/17 2323) BP: (118-134)/(70-88) 132/88 (06/18 0700) SpO2:  [94 %-100 %] 100 % (06/18 0323) Last BM Date: 06/02/21  Intake/Output from previous day: 06/17 0701 - 06/18 0700 In: 1040.9 [P.O.:680; I.V.:360.9] Out: 1100 [Urine:1100] Intake/Output this shift: No intake/output data recorded.  PE: Gen:  Alert, NAD, pleasant Pulm: Normal rate and effort, on o2 Abd: Soft, mild distension, lower abdominal tenderness reported with palpation without peritonitis. +BS GU: clear yellow urine in cannister  Skin: no rashes noted, warm and dry   Lab Results:  Recent Labs    06/02/21 1750 06/03/21 0312  WBC 17.9* 16.3*  HGB 8.0* 7.2*  HCT 24.6* 21.7*  PLT 144* 157   BMET Recent Labs    06/02/21 0728 06/03/21 0845  NA 137 137  K 3.8 4.3  CL 106 105  CO2 21* 25  GLUCOSE 272* 236*  BUN 21 12  CREATININE 1.05* 0.70  CALCIUM 8.4* 8.7*   PT/INR No results for input(s): LABPROT, INR in the last 72 hours. CMP     Component Value Date/Time   NA 137 06/03/2021 0845   NA 141 11/07/2020 1058   K 4.3 06/03/2021 0845   CL 105 06/03/2021 0845   CO2 25 06/03/2021 0845   GLUCOSE 236 (H) 06/03/2021 0845   BUN 12 06/03/2021 0845   BUN 13 11/07/2020 1058   CREATININE 0.70 06/03/2021 0845   CALCIUM 8.7 (L) 06/03/2021 0845   PROT 4.6 (L) 06/03/2021 0845   PROT 6.5 11/07/2020 1058   ALBUMIN 1.9 (L) 06/03/2021 0845   ALBUMIN 4.3 11/07/2020 1058   AST 14 (L) 06/03/2021 0845   ALT 19 06/03/2021 0845   ALKPHOS 94 06/03/2021 0845   BILITOT 0.6 06/03/2021 0845   BILITOT 0.6 11/07/2020 1058   GFRNONAA >60 06/03/2021 0845   GFRAA 61 11/07/2020 1058   Lipase     Component  Value Date/Time   LIPASE 19 05/26/2021 1601       Studies/Results: IR IVC FILTER PLMT / S&I Lenise Arena GUID/MOD SED  Result Date: 06/01/2021 INDICATION: PE/DVT.  Acute GI bleed. EXAM: Retrievable IVC filter placement MEDICATIONS: None. ANESTHESIA/SEDATION: One mg IV Versed; 25 mcg IV Fentanyl Moderate Sedation Time:  10 minutes The patient was continuously monitored during the procedure by the interventional radiology nurse under my direct supervision. FLUOROSCOPY TIME:  Fluoroscopy Time: 0 minutes 48 seconds (76 mGy). COMPLICATIONS: None immediate. PROCEDURE: Informed written consent was obtained from the patient after a thorough discussion of the procedural risks, benefits and alternatives. All questions were addressed. Maximal Sterile Barrier Technique was utilized including caps, mask, sterile gowns, sterile gloves, sterile drape, hand hygiene and skin antiseptic. A timeout was performed prior to the initiation of the procedure. Patient positioned supine on the angiography table. Right neck prepped and draped in usual sterile fashion. All elements of maximal sterile barrier were utilized including, cap, mask, sterile gown, sterile gloves, large sterile drape, hand scrubbing and 2% Chlorhexidine for skin cleaning. The right internal jugular vein was evaluated with ultrasound and shown to be patent. A permanent ultrasound image was obtained and placed in the patient's medical record. Using sterile gel and a sterile probe cover, the right internal  jugular vein was entered with a 21 ga needle during real time ultrasound guidance. 21 gauge needle exchanged for a transitional dilator set over 0.018 inch guidewire. Transitional dilator set exchanged for 10 fr sheath over 0.035 inch guidewire. Sheath placed to the infrarenal IVC and CO2 venogram performed to delineate the position of the renal veins. Retrievable Denali IVC filter deployed in the infrarenal location. Sheath removed and hemostasis achieved with manual  compression. IMPRESSION: Retrievable IVC filter placement as above. PLAN: This IVC filter is potentially retrievable. The patient will be assessed for filter retrieval by Interventional Radiology in approximately 8-12 weeks. Further recommendations regarding filter retrieval, continued surveillance or declaration of device permanence, will be made at that time. Electronically Signed   By: Acquanetta Belling M.D.   On: 06/01/2021 16:42   IR US Guide Vasc Access Right  Result Date: 06/01/2021 INDICATION: PE/DVT.  Acute GI bleed. EXAM: Retrievable IVC filter placement MEDICATIONS: None. ANESTHESIA/SEDATION: One mg IV Versed; 25 mcg IV Fentanyl Moderate Sedation Time:  10 minutes The patient was continuously monitored during the procedure by the interventional radiology nurse under my direct supervision. FLUOROSCOPY TIME:  Fluoroscopy Time: 0 minutes 48 seconds (76 mGy). COMPLICATIONS: None immediate. PROCEDURE: Informed written consent was obtained from the patient after a thorough discussion of the procedural risks, benefits and alternatives. All questions were addressed. Maximal Sterile Barrier Technique was utilized including caps, mask, sterile gowns, sterile gloves, sterile drape, hand hygiene and skin antiseptic. A timeout was performed prior to the initiation of the procedure. Patient positioned supine on the angiography table. Right neck prepped and draped in usual sterile fashion. All elements of maximal sterile barrier were utilized including, cap, mask, sterile gown, sterile gloves, large sterile drape, hand scrubbing and 2% Chlorhexidine for skin cleaning. The right internal jugular vein was evaluated with ultrasound and shown to be patent. A permanent ultrasound image was obtained and placed in the patient's medical record. Using sterile gel and a sterile probe cover, the right internal jugular vein was entered with a 21 ga needle during real time ultrasound guidance. 21 gauge needle exchanged for a  transitional dilator set over 0.018 inch guidewire. Transitional dilator set exchanged for 10 fr sheath over 0.035 inch guidewire. Sheath placed to the infrarenal IVC and CO2 venogram performed to delineate the position of the renal veins. Retrievable Denali IVC filter deployed in the infrarenal location. Sheath removed and hemostasis achieved with manual compression. IMPRESSION: Retrievable IVC filter placement as above. PLAN: This IVC filter is potentially retrievable. The patient will be assessed for filter retrieval by Interventional Radiology in approximately 8-12 weeks. Further recommendations regarding filter retrieval, continued surveillance or declaration of device permanence, will be made at that time. Electronically Signed   By: Acquanetta Belling M.D.   On: 06/01/2021 16:42    Anti-infectives: Anti-infectives (From admission, onward)    Start     Dose/Rate Route Frequency Ordered Stop   05/27/21 0500  piperacillin-tazobactam (ZOSYN) IVPB 3.375 g        3.375 g 12.5 mL/hr over 240 Minutes Intravenous Every 8 hours 05/26/21 2126     05/26/21 2015  ceFEPIme (MAXIPIME) 2 g in sodium chloride 0.9 % 100 mL IVPB       See Hyperspace for full Linked Orders Report.   2 g 200 mL/hr over 30 Minutes Intravenous  Once 05/26/21 2004 05/26/21 2107   05/26/21 2015  metroNIDAZOLE (FLAGYL) IVPB 500 mg       See Hyperspace for full Linked Orders Report.  500 mg 100 mL/hr over 60 Minutes Intravenous  Once 05/26/21 2004 05/26/21 2135        Assessment/Plan  69F Diverticulitis with intra-abdominal abscess (9x5 cm) - afebrile, WBC stable at 16.3 from 16.5 - IR without a safe window for abscess drainage 6/12 - Appears she was NT yesterday on exam but has some mild lower abdominal tenderness this am. Will continue with plan for repeat imaging today, to re-evaluate intraabd abscess and possible window for IR drainage. Given the size of the abscess, it may not fully resolve without IR drainage or surgery. Our  team has discussed the likelihood of colostomy creation if surgery was required.  - continue IV abx - No emergent surgical needs today.   - OOB/mobilize with therapies    FEN: NPO for CT, TPN ID: Zosyn VTE: SCD's, hep gtt on hold for anemia  Foley: none, external cath Dispo: SDU   - Below per primary service - Bilateral PE-heparin IV held due to hgb requiring transfusion. Hgb 7.2 this am from 8.0. S/p IVCF 6/16 by IR Melena - EGD 6/16 normal esophagus, erythematous mucosa in the antrum, duodenitis but no active bleeding. GI signed off. Acute respiratory failure with hypoxia Thombocytopenia Chronic diastolic heart failure CKD IIIa HTN DM2 Morbid obesity    LOS: 8 days    Jacinto Halim , [facility] 06/03/2021, 10:42 AM Please see Amion for pager number during day hours 7:00am-4:30pm

## 2021-06-03 NOTE — Progress Notes (Signed)
PT CBG 416. N.P. Informed. 22 units ordered and given to the patient. Will continue to monitor.

## 2021-06-03 NOTE — Progress Notes (Addendum)
PHARMACY - TOTAL PARENTERAL NUTRITION CONSULT NOTE   Indication:  Intolerance to enteral feeding  Patient Measurements: Height: 5\' 2"  (157.5 cm) Weight: 109.1 kg (240 lb 8.4 oz) IBW/kg (Calculated) : 50.1 TPN AdjBW (KG): 64.9 Body mass index is 43.99 kg/m.  Assessment: 70 years of age female with known celiac disease, CHF, HTN, and DM admitted on 05/26/21 with perforated sigmoid diverticulitis and abscess as well as pulmonary embolism. IR unable to find a safe window for abscess drain placement and General Surgery recommending no surgery at this time. Attempted full liquids but had increased abdominal pain and loose stools so backed back down to clear liquids. Currently having bowel movements. Day #6 of hospital stay without adequate intake and patient has had N/V/D for 2 weeks prior to admission.   Daughter reports abdominal pain starting around 2 weeks ago which led to decreased oral intake which has gotten progressively worse leading up to admission. Since abdominal pain started, maybe would eat around 2 times per day.  Daughter reports patient would eat healthy (vegetables, fish, eggs) prior to abdominal pain. Drinks Glucerna shakes throughout the day.  Reports ~30 pound weight loss over the past 6 months. Patient is at risk for refeeding.   Patient started on CLD 6/17 AM- daughter reports she has had a cup of coffee and is fixing to drink broth once it cools.  Patient denies nausea or abdominal pain.  Discussed with CCS- continue TPN for now and repeat imaging tomorrow.    Glucose / Insulin: DM with uncontrolled CBGs. Lantus 12 units daily (no insulin in TPN).  rSSI 36 units used. Restarting steroids as IV while NPO.  Electrolytes: Na 137, K 4.3, Mg 1.7, Phos 2.3, CoCa 10.4, CO2 25 Renal: SCr 0.7, BUN 12 Hepatic: LFTs/Tbili wnl, TG 108 Intake / Output; MIVF: UOP documentation not accurate, Last BM 6/17, no MIVF GI Imaging:  6/10 CT A/P: perforated diverticulitis w/ large abscess  GI  Surgeries / Procedures:  6/16 EGD: normal esophagus, erythematous mucosa, duodenitis, healed ulcer  Central access: Double lumen PICC 6/16 TPN start date: 6/16  Nutritional Goals (per RD recommendation on 05/29/21): kCal: 2000-2200, Protein: 110-120, Fluid: >= 2L Goal TPN 90 ml/hr will provide 115g AA, 63g ILE, and 297g CHO for a total of 2100 kcal/day  Current Nutrition:  Clear liquids and TPN   Plan:  Unable to advance TPN until CBGs consistently <200  Continue TPN at 52mL/hr at 1800 Electrolytes in TPN: Na 77mEq/L, K 24mEq/L, decr Ca 29mEq/L, Mg 8/L, and Phos 36mmol/L. Cl:Ac 1:2 Add standard MVI and trace elements to TPN Continue Resistant SSI q4h  Insulin regimen per MD  F/u BMET 6/19 Monitor TPN labs on Mon/Thurs  KPhos Neutral 250 mg PO x1 to avoid dextrose in IV products  Thank you for involving pharmacy in this patient's care.  7/19, PharmD, BCPS Clinical Pharmacist Clinical phone for 06/03/2021 until 3p is 06/05/2021 06/03/2021 7:16 AM  **Pharmacist phone directory can be found on [email or web address] listed under [facility]**

## 2021-06-04 LAB — BASIC METABOLIC PANEL
Anion gap: 5 (ref 5–15)
BUN: 10 mg/dL (ref 8–23)
CO2: 29 mmol/L (ref 22–32)
Calcium: 8.4 mg/dL — ABNORMAL LOW (ref 8.9–10.3)
Chloride: 102 mmol/L (ref 98–111)
Creatinine, Ser: 0.73 mg/dL (ref 0.44–1.00)
GFR, Estimated: >60 mL/min (ref >60)
Glucose, Bld: 123 mg/dL — ABNORMAL HIGH (ref 70–99)
Potassium: 3.8 mmol/L (ref 3.5–5.1)
Sodium: 136 mmol/L (ref 135–145)

## 2021-06-04 LAB — GLUCOSE, CAPILLARY
Glucose-Capillary: 133 mg/dL — ABNORMAL HIGH (ref 70–99)
Glucose-Capillary: 144 mg/dL — ABNORMAL HIGH (ref 70–99)
Glucose-Capillary: 188 mg/dL — ABNORMAL HIGH (ref 70–99)
Glucose-Capillary: 224 mg/dL — ABNORMAL HIGH (ref 70–99)
Glucose-Capillary: 250 mg/dL — ABNORMAL HIGH (ref 70–99)
Glucose-Capillary: 259 mg/dL — ABNORMAL HIGH (ref 70–99)
Glucose-Capillary: 89 mg/dL (ref 70–99)

## 2021-06-04 LAB — PHOSPHORUS: Phosphorus: 2.3 mg/dL — ABNORMAL LOW (ref 2.5–4.6)

## 2021-06-04 LAB — HEMOGLOBIN AND HEMATOCRIT, BLOOD
HCT: 20.7 % — ABNORMAL LOW (ref 36.0–46.0)
Hemoglobin: 6.6 g/dL — CL (ref 12.0–15.0)

## 2021-06-04 LAB — PREPARE RBC (CROSSMATCH)

## 2021-06-04 LAB — MAGNESIUM: Magnesium: 1.6 mg/dL — ABNORMAL LOW (ref 1.7–2.4)

## 2021-06-04 MED ORDER — SODIUM CHLORIDE 0.9% IV SOLUTION: Freq: Once | INTRAVENOUS | Status: AC

## 2021-06-04 MED ORDER — K PHOS MONO-SOD PHOS DI & MONO 155-852-130 MG PO TABS
500.0000 mg | ORAL_TABLET | Freq: Once | ORAL | Status: AC
  Administered 2021-06-04: 500 mg via ORAL
  Filled 2021-06-04: qty 2

## 2021-06-04 MED ORDER — INSULIN GLARGINE 100 UNIT/ML ~~LOC~~ SOLN
6.0000 [IU] | Freq: Every day | SUBCUTANEOUS | Status: DC
  Administered 2021-06-04 – 2021-06-06 (×3): 6 [IU] via SUBCUTANEOUS
  Filled 2021-06-04 (×3): qty 0.06

## 2021-06-04 MED ORDER — MAGNESIUM SULFATE 2 GM/50ML IV SOLN
2.0000 g | Freq: Once | INTRAVENOUS | Status: AC
  Administered 2021-06-04: 2 g via INTRAVENOUS
  Filled 2021-06-04: qty 50

## 2021-06-04 MED ORDER — TRACE MINERALS CU-MN-SE-ZN 300-55-60-3000 MCG/ML IV SOLN
INTRAVENOUS | Status: AC
  Filled 2021-06-04: qty 445.2

## 2021-06-04 NOTE — Progress Notes (Signed)
[facility] Progress Note:   3 Days Post-Op  Subjective: Mental status is alert and pleasant.  Complaints none. Objective: Vital signs in last 24 hours: Temp:  [97.6 F (36.4 C)-98.7 F (37.1 C)] 97.7 F (36.5 C) (06/19 0927) Pulse Rate:  [61-89] 71 (06/19 0927) Resp:  [13-18] 16 (06/19 0705) BP: (108-144)/(74-84) 135/84 (06/19 0927) SpO2:  [84 %-100 %] 97 % (06/19 0927)  Intake/Output from previous day: 06/18 0701 - 06/19 0700 In: 3016 [P.O.:1440; I.V.:1246.6; IV Piggyback:329.4] Out: 1300 [Urine:1250; Stool:50] Intake/Output this shift: Total I/O In: 601.8 [I.V.:206.8; Blood:345; IV Piggyback:50] Out: 0   Physical Exam: Work of breathing is not labored at rest.    Lab Results:  Results for orders placed or performed during the hospital encounter of 05/26/21 (from the past 48 hour(s))  Glucose, capillary     Status: Abnormal   Collection Time: 06/02/21 11:38 AM  Result Value Ref Range   Glucose-Capillary 216 (H) 70 - 99 mg/dL    Comment: Glucose reference range applies only to samples taken after fasting for at least 8 hours.  Glucose, capillary     Status: Abnormal   Collection Time: 06/02/21  3:43 PM  Result Value Ref Range   Glucose-Capillary 239 (H) 70 - 99 mg/dL    Comment: Glucose reference range applies only to samples taken after fasting for at least 8 hours.  CBC     Status: Abnormal   Collection Time: 06/02/21  5:50 PM  Result Value Ref Range   WBC 17.9 (H) 4.0 - 10.5 K/uL   RBC 2.60 (L) 3.87 - 5.11 MIL/uL   Hemoglobin 8.0 (L) 12.0 - 15.0 g/dL   HCT 55.3 (L) 74.8 - 27.0 %   MCV 94.6 80.0 - 100.0 fL   MCH 30.8 26.0 - 34.0 pg   MCHC 32.5 30.0 - 36.0 g/dL   RDW 78.6 (H) 75.4 - 49.2 %   Platelets 144 (L) 150 - 400 K/uL   nRBC 1.3 (H) 0.0 - 0.2 %    Comment: Performed at [facility], [address], [place], [place] [address]  Glucose, capillary     Status: Abnormal   Collection Time: 06/02/21  8:03 PM  Result Value Ref Range    Glucose-Capillary 251 (H) 70 - 99 mg/dL    Comment: Glucose reference range applies only to samples taken after fasting for at least 8 hours.  Glucose, capillary     Status: Abnormal   Collection Time: 06/02/21 11:12 PM  Result Value Ref Range   Glucose-Capillary 270 (H) 70 - 99 mg/dL    Comment: Glucose reference range applies only to samples taken after fasting for at least 8 hours.  CBC     Status: Abnormal   Collection Time: 06/03/21  3:12 AM  Result Value Ref Range   WBC 16.3 (H) 4.0 - 10.5 K/uL   RBC 2.27 (L) 3.87 - 5.11 MIL/uL   Hemoglobin 7.2 (L) 12.0 - 15.0 g/dL   HCT 12.1 (L) 97.5 - 88.3 %   MCV 95.6 80.0 - 100.0 fL   MCH 31.7 26.0 - 34.0 pg   MCHC 33.2 30.0 - 36.0 g/dL   RDW 25.4 (H) 98.2 - 64.1 %   Platelets 157 150 - 400 K/uL   nRBC 1.2 (H) 0.0 - 0.2 %    Comment: Performed at [facility], [address] [place], [place] [address]  Glucose, capillary     Status: Abnormal   Collection Time: 06/03/21  3:21 AM  Result Value Ref Range   Glucose-Capillary 197 (H) 70 - 99 mg/dL    Comment: Glucose reference range applies only to samples taken after fasting for at least 8 hours.  Glucose, capillary     Status: Abnormal   Collection Time: 06/03/21  8:39 AM  Result Value Ref Range   Glucose-Capillary 201 (H) 70 - 99 mg/dL    Comment: Glucose reference range applies only to samples taken after fasting for at least 8 hours.  Magnesium     Status: None   Collection Time: 06/03/21  8:45 AM  Result Value Ref Range   Magnesium 1.7 1.7 - 2.4 mg/dL    Comment: Performed at [name], [address] [place], [place] [address]  Phosphorus     Status: Abnormal   Collection Time: 06/03/21  8:45 AM  Result Value Ref Range   Phosphorus 2.3 (L) 2.5 - 4.6 mg/dL    Comment: Performed at [facility], [address] [place], [place] [address]  Comprehensive metabolic panel     Status: Abnormal   Collection Time: 06/03/21  8:45 AM  Result Value Ref Range   Sodium  137 135 - 145 mmol/L   Potassium 4.3 3.5 - 5.1 mmol/L   Chloride 105 98 - 111 mmol/L   CO2 25 22 - 32 mmol/L   Glucose, Bld 236 (H) 70 - 99 mg/dL    Comment: Glucose reference range applies only to samples taken after fasting for at least 8 hours.   BUN 12 8 - 23 mg/dL   Creatinine, Ser 0.980.70 0.44 - 1.00 mg/dL   Calcium 8.7 (L) 8.9 - 10.3 mg/dL   Total Protein 4.6 (L) 6.5 - 8.1 g/dL   Albumin 1.9 (L) 3.5 - 5.0 g/dL   AST 14 (L) 15 - 41 U/L   ALT 19 0 - 44 U/L   Alkaline Phosphatase 94 38 - 126 U/L   Total Bilirubin 0.6 0.3 - 1.2 mg/dL   GFR, Estimated >11>60 >91>60 mL/min    Comment: (NOTE) Calculated using the CKD-EPI Creatinine Equation (2021)    Anion gap 7 5 - 15    Comment: Performed at [facility], [address] [place], [place] [address]  Glucose, capillary     Status: Abnormal   Collection Time: 06/03/21 11:16 AM  Result Value Ref Range   Glucose-Capillary 265 (H) 70 - 99 mg/dL    Comment: Glucose reference range applies only to samples taken after fasting for at least 8 hours.  Hemoglobin and hematocrit, blood     Status: Abnormal   Collection Time: 06/03/21 12:11 PM  Result Value Ref Range   Hemoglobin 7.1 (L) 12.0 - 15.0 g/dL   HCT 56.222.1 (L) 13.036.0 - 86.546.0 %    Comment: Performed at [facility], [address] [place], [place] [address]  CBC with Differential/Platelet     Status: Abnormal   Collection Time: 06/03/21 12:26 PM  Result Value Ref Range   WBC 15.5 (H) 4.0 - 10.5 K/uL   RBC 2.33 (L) 3.87 - 5.11 MIL/uL   Hemoglobin 7.3 (L) 12.0 - 15.0 g/dL   HCT 62.922.4 (L) 52.836.0 - 41.346.0 %   MCV 96.1 80.0 - 100.0 fL   MCH 31.3 26.0 - 34.0 pg   MCHC 32.6 30.0 - 36.0 g/dL   RDW 24.417.1 (H) 01.011.5 - 27.215.5 %   Platelets 176 150 - 400 K/uL   nRBC 1.2 (H) 0.0 -  0.2 %   Neutrophils Relative % 85 %   Neutro Abs 13.1 (H) 1.7 - 7.7 K/uL   Lymphocytes Relative 8 %   Lymphs Abs 1.3 0.7 - 4.0 K/uL   Monocytes Relative 4 %   Monocytes Absolute 0.6 0.1 - 1.0 K/uL   Eosinophils  Relative 0 %   Eosinophils Absolute 0.0 0.0 - 0.5 K/uL   Basophils Relative 0 %   Basophils Absolute 0.0 0.0 - 0.1 K/uL   Immature Granulocytes 3 %   Abs Immature Granulocytes 0.50 (H) 0.00 - 0.07 K/uL    Comment: Performed at [facility], [address] [place], [place] [address]  Glucose, capillary     Status: Abnormal   Collection Time: 06/03/21  3:21 PM  Result Value Ref Range   Glucose-Capillary 291 (H) 70 - 99 mg/dL    Comment: Glucose reference range applies only to samples taken after fasting for at least 8 hours.  Hemoglobin and hematocrit, blood     Status: Abnormal   Collection Time: 06/03/21  8:13 PM  Result Value Ref Range   Hemoglobin 7.2 (L) 12.0 - 15.0 g/dL   HCT 60.4 (L) 54.0 - 98.1 %    Comment: Performed at [facility], [address], [place], [place] [address]  Glucose, capillary     Status: Abnormal   Collection Time: 06/03/21  8:26 PM  Result Value Ref Range   Glucose-Capillary 416 (H) 70 - 99 mg/dL    Comment: Glucose reference range applies only to samples taken after fasting for at least 8 hours.  Glucose, capillary     Status: Abnormal   Collection Time: 06/04/21 12:08 AM  Result Value Ref Range   Glucose-Capillary 224 (H) 70 - 99 mg/dL    Comment: Glucose reference range applies only to samples taken after fasting for at least 8 hours.  Glucose, capillary     Status: Abnormal   Collection Time: 06/04/21  3:50 AM  Result Value Ref Range   Glucose-Capillary 133 (H) 70 - 99 mg/dL    Comment: Glucose reference range applies only to samples taken after fasting for at least 8 hours.  Hemoglobin and hematocrit, blood     Status: Abnormal   Collection Time: 06/04/21  4:00 AM  Result Value Ref Range   Hemoglobin 6.6 (LL) 12.0 - 15.0 g/dL    Comment: REPEATED TO VERIFY THIS CRITICAL RESULT HAS VERIFIED AND BEEN CALLED TO A.BAILIFF,RN BY MELISSA BROGDON ON 06 19 2022 AT 0436, AND HAS BEEN READ BACK.     HCT 20.7 (L) 36.0 - 46.0 %    Comment:  Performed at [facility], [address] [place], [place] [address]  Basic metabolic panel     Status: Abnormal   Collection Time: 06/04/21  4:00 AM  Result Value Ref Range   Sodium 136 135 - 145 mmol/L   Potassium 3.8 3.5 - 5.1 mmol/L   Chloride 102 98 - 111 mmol/L   CO2 29 22 - 32 mmol/L   Glucose, Bld 123 (H) 70 - 99 mg/dL    Comment: Glucose reference range applies only to samples taken after fasting for at least 8 hours.   BUN 10 8 - 23 mg/dL   Creatinine, Ser 2.13 0.44 - 1.00 mg/dL   Calcium 8.4 (L) 8.9 - 10.3 mg/dL   GFR, Estimated >08 >65 mL/min    Comment: (NOTE) Calculated using the CKD-EPI Creatinine Equation (2021)    Anion gap 5  5 - 15    Comment: Performed at [facility], [address], [place], [place] [address]  Phosphorus     Status: Abnormal   Collection Time: 06/04/21  4:00 AM  Result Value Ref Range   Phosphorus 2.3 (L) 2.5 - 4.6 mg/dL    Comment: Performed at [facility], [address], [place], [place] [address]  Magnesium     Status: Abnormal   Collection Time: 06/04/21  4:00 AM  Result Value Ref Range   Magnesium 1.6 (L) 1.7 - 2.4 mg/dL    Comment: Performed at [facility], [address], [place], [place] [address]  Type and screen [facility]     Status: None (Preliminary result)   Collection Time: 06/04/21  4:51 AM  Result Value Ref Range   ABO/RH(D) B POS    Antibody Screen NEG    Sample Expiration 06/07/2021,2359    Unit Number [id number]    Blood Component Type RED CELLS,LR    Unit division 00    Status of Unit ISSUED    Transfusion Status OK TO TRANSFUSE    Crossmatch Result      Compatible Performed at [facility], [address], [place], [place] [address]   Prepare RBC (crossmatch)     Status: None   Collection Time: 06/04/21  4:51 AM  Result Value Ref Range   Order Confirmation      ORDER PROCESSED BY BLOOD BANK Performed at [facility], [address]  [place], [place] [address]   Glucose, capillary     Status: None   Collection Time: 06/04/21  8:01 AM  Result Value Ref Range   Glucose-Capillary 89 70 - 99 mg/dL    Comment: Glucose reference range applies only to samples taken after fasting for at least 8 hours.    Radiology/Results: CT ABDOMEN PELVIS W CONTRAST  Result Date: 06/03/2021 CLINICAL DATA:  History of diverticulitis, follow-up exam EXAM: CT ABDOMEN AND PELVIS WITH CONTRAST TECHNIQUE: Multidetector CT imaging of the abdomen and pelvis was performed using the standard protocol following bolus administration of intravenous contrast. CONTRAST:  OMNIPAQUE IOHEXOL 300 MG/ML  SOLN COMPARISON:  05/26/2021 FINDINGS: Lower chest: Lung bases demonstrate some minimal atelectatic changes on the right. Previously seen pulmonary emboli are again identified right greater than left. Hepatobiliary: Fatty infiltration of the liver is noted. A small cyst is noted within the right lobe. Gallbladder is unremarkable. Pancreas: Unremarkable. No pancreatic ductal dilatation or surrounding inflammatory changes. Spleen: Normal in size without focal abnormality. Adrenals/Urinary Tract: Adrenal glands are unremarkable. Kidneys demonstrate a normal enhancement pattern bilaterally. No definitive renal calculi are seen. No obstructive changes are noted. Normal excretion is noted on delayed images. The bladder is well distended. Stomach/Bowel: Rectal tube is noted within the rectum. The sigmoid colon again demonstrates diverticulosis although the degree of inflammatory change has improved in the interval from the prior exam. The more proximal colon is unremarkable. Appendix is well visualized and within normal limits. Extraluminal air is noted surrounding the colon in the pelvis slightly increased in the interval from the prior exam. Additionally a large air-fluid collection is again identified interposed between the small bowel loops and uterus adjacent to the sigmoid  colon. This now measures 10.5 x 6.8 cm in greatest transverse and AP dimensions and extends for at least 6.7 cm in craniocaudad projection. This is again consistent with a large abscess. The abscess is again draped with  multiple loops of small bowel precluding percutaneous drainage. The surrounding small bowel loops again demonstrates some mild inflammatory thickening. More proximal small bowel is within normal limits. No obstructive changes are seen. The stomach is unremarkable. Vascular/Lymphatic: Aortic atherosclerosis. No enlarged abdominal or pelvic lymph nodes. IVC filter is now seen in satisfactory position. Reproductive: Uterus and bilateral adnexa are unremarkable. Other: No free fluid is noted within the pelvis. No herniation is seen. Musculoskeletal: Degenerative changes of lumbar spine are noted. No acute bony abnormality is seen. IMPRESSION: Persistent and increasing abscess within the lower abdomen/pelvis interposed between multiple loops of inflamed small bowel in the uterus. The abscess is again draped with multiple loops of inflamed small bowel precluding percutaneous drainage. Persistent bilateral pulmonary emboli right greater than left. Fatty liver. Previously seen diverticulitis inflammatory change has improved in the interval from the prior study. Electronically Signed   By: Alcide Clever M.D.   On: 06/03/2021 15:21    Anti-infectives: Anti-infectives (From admission, onward)    Start     Dose/Rate Route Frequency Ordered Stop   05/27/21 0500  piperacillin-tazobactam (ZOSYN) IVPB 3.375 g        3.375 g 12.5 mL/hr over 240 Minutes Intravenous Every 8 hours 05/26/21 2126     05/26/21 2015  ceFEPIme (MAXIPIME) 2 g in sodium chloride 0.9 % 100 mL IVPB       See Hyperspace for full Linked Orders Report.   2 g 200 mL/hr over 30 Minutes Intravenous  Once 05/26/21 2004 05/26/21 2107   05/26/21 2015  metroNIDAZOLE (FLAGYL) IVPB 500 mg       See Hyperspace for full Linked Orders Report.    500 mg 100 mL/hr over 60 Minutes Intravenous  Once 05/26/21 2004 05/26/21 2135       Assessment/Plan: Problem List: Patient Active Problem List   Diagnosis Date Noted   Diverticulitis of colon with perforation 05/26/2021   Bilateral pulmonary embolism (HCC) 05/26/2021   Cardiac arrhythmia 05/23/2021   Chronic kidney disease due to hypertension 05/23/2021   Chronic kidney disease, stage 3a (HCC) 05/23/2021   Diabetic renal disease (HCC) 05/23/2021   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   Gastroesophageal reflux disease 05/23/2021   Hyperlipidemia 05/23/2021   Moderate persistent asthma, uncomplicated 05/23/2021   Stroke (HCC) 05/23/2021   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 05/23/2021   Morbid obesity (HCC) 11/07/2020   Optic neuropathy, compressive 08/24/2020   Thyroid eye disease 08/04/2020   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 (HCC) 04/09/2020   Hypertensive retinopathy of both eyes 04/09/2020   Anemia 08/07/2017   Celiac disease 08/07/2017   Graves' disease with exophthalmos 08/07/2017   History of depression 08/07/2017   Diastolic dysfunction 05/25/2013   Headache(784.0) 05/18/2013   Chronic diastolic CHF (congestive heart failure) (HCC) 12/04/2011   Hemorrhoids, internal, with bleeding 11/28/2011   Seizure (HCC) 06/30/2010    CT still with no window for perc drainage.   3 Days Post-Op    LOS: 9 days   Matt B. Daphine Deutscher, MD, [facility] [phone] to reach the surgeon on call.    06/04/2021 11:14 AM

## 2021-06-04 NOTE — Progress Notes (Signed)
PROGRESS NOTE    Damira Kem  ALP:[id number] DOB: Sep 27, 1951 DOA: 05/26/2021 PCP: Ollen Bowl, MD    Brief Narrative:  Mrs. Lesperance was admitted to the hospital with the working diagnosis of perforated diverticulitis complicated with intra-abdominal abscess, pulmonary embolism and acute blood loss anemia (upper GI bleed).    70 year old female past medical history for diastolic heart failure, hypertension, type 2 diabetes mellitus, and obstructive sleep apnea who presented with abdominal pain, nausea, vomiting and diarrhea for 2 weeks.  Patient was referred to the ED by her primary care provider.  On her initial physical examination she was hypotensive that improved with IV fluids to 110/82, heart rate 95, respiratory rate 22, oxygen saturation 96%, her lungs were clear to auscultation, heart S1-S2, present, rhythmic, soft tender to palpation, no rebound or guarding, no lower extremity edema.  Sodium 134, potassium 2.8, chloride 98, bicarb 24, glucose 287, BUN 23, creatinine 1.28, white count 8.2, hemoglobin 13.1, hematocrit 41.0, platelets clumped.  CT of the abdomen pelvis with perforated diverticulitis involving the mid sigmoid colon with a large 9.2x5.8 complex abscess anterior to the uterus and in between distal small bowel loops.  Bilateral central and lower lobe pulmonary emboli.  Chest radiograph no infiltrates.  EKG 99 bpm, left axis deviation, left anterior fascicular block, normal intervals, sinus rhythm with PACs, no significant ST segment or T wave changes.  Assessment & Plan:   Principal Problem:   Diverticulitis of colon with perforation Active Problems:   Chronic diastolic CHF (congestive heart failure) (HCC)   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 (HCC)   Moderate persistent asthma, uncomplicated   Seizure (HCC)   Bilateral pulmonary embolism (HCC)   Acute diverticulitis, complicated with intra-abdominal abscess. Patient with no significant  abdominal pain and tolerating TPN for nutrition. Wbc 15,5 and Hgb down to 6,6.   Plan to continue antibiotic therapy with Zosyn #8. Continue pain control and nutritional support.  Follow with surgery recommendations, currently no window to place a drain catheter, with increasing and persistent abscess within the lower abdomen and pelvis.   2. Acute bilateral pulmonary embolism/ acute hypoxemic respiratory failure. Patient with no chest pain or dyspnea at rest. Oxygenation is 95% on nasal cannula 2L/min.   Not able to anticoagulate due to GI bleeding, patient had a IVC filter placed. Plan to continue to hold on anticoagulation for now.   3. Acute blood loss anemia, due to GI bleeding. Hgb down to 6,6 this am.  Plan for PRBC transfusion today and follow cell count in am.   4. Uncontrolled T2DM Hyper and hypoglycemia. Continue glucose cover and monitoring with insulin sliding scale. Capillary glucose this am down to 89. Had large dose of insulin aspart last night.  Plan to decrease basal insulin to 6 units and continue close monitoring.   5. CKD stage 3a renal function with serum cr at 0,73 with K at 3,8 and serum bicarbonate at 29. Continue close follow up on renal function and electrolytes.   6. Heart failure with diastolic dysfunction No clinical sings of decompensation   7. Hyperthyroidism. Continue with levothyroxine. On steroid taper.   Patient continue to be at high risk for worsening abscess   Status is: Inpatient  Remains inpatient appropriate because:IV treatments appropriate due to intensity of illness or inability to take PO  Dispo: The patient is from: Home              Anticipated d/c is to: SNF  Patient currently is not medically stable to d/c.   Difficult to place patient No  DVT prophylaxis: IVC filter   Code Status:   full  Family Communication:  No family at the bedside      Consultants:  Surgery    Antimicrobials:  Zosyn      Subjective: Patient continue to be very weak and deconditioned, not yet back to her baseline, no nausea or vomiting, no chest pain or dyspnea.   Objective: Vitals:   06/04/21 0705 06/04/21 0911 06/04/21 0927 06/04/21 1100  BP: 123/74  135/84 121/79  Pulse: 68 71 71   Resp: 16   12  Temp: 97.6 F (36.4 C)  97.7 F (36.5 C) 97.6 F (36.4 C)  TempSrc: Oral  Oral Oral  SpO2: 97% 98% 97% 95%  Weight:      Height:        Intake/Output Summary (Last 24 hours) at 06/04/2021 1307 Last data filed at 06/04/2021 1208 Gross per 24 hour  Intake 3737.73 ml  Output 1700 ml  Net 2037.73 ml   Filed Weights   05/27/21 0041  Weight: 109.1 kg    Examination:   General: Not in pain or dyspnea, deconditioned  Neurology: Awake and alert, non focal  E ENT: mild pallor, no icterus, oral mucosa moist Cardiovascular: No JVD. S1-S2 present, rhythmic, no gallops, rubs, or murmurs. No lower extremity edema. Pulmonary: positive breath sounds bilaterally, with no wheezing, rhonchi or rales. Gastrointestinal. Abdomen protuberant, not tender to palpation,  Skin. No rashes Musculoskeletal: no joint deformities     Data Reviewed: I have personally reviewed following labs and imaging studies  CBC: Recent Labs  Lab 06/01/21 0503 06/01/21 0912 06/02/21 0728 06/02/21 1750 06/03/21 0312 06/03/21 1211 06/03/21 1226 06/03/21 2013 06/04/21 0400  WBC 12.9*  --  16.5* 17.9* 16.3*  --  15.5*  --   --   NEUTROABS 9.8*  --   --   --   --   --  13.1*  --   --   HGB 7.2*   < > 7.6* 8.0* 7.2* 7.1* 7.3* 7.2* 6.6*  HCT 22.5*   < > 23.3* 24.6* 21.7* 22.1* 22.4* 22.1* 20.7*  MCV 96.6  --  96.7 94.6 95.6  --  96.1  --   --   PLT 154  --  151 144* 157  --  176  --   --    < > = values in this interval not displayed.   Basic Metabolic Panel: Recent Labs  Lab 05/31/21 0221 06/01/21 0503 06/02/21 0728 06/03/21 0845 06/04/21 0400  NA 135 139 137 137 136  K 5.1 4.2 3.8 4.3 3.8  CL 103 107 106 105  102  CO2 23 25 21* 25 29  GLUCOSE 310* 107* 272* 236* 123*  BUN 25* 30* 21 12 10   CREATININE 0.86 1.16* 1.05* 0.70 0.73  CALCIUM 7.9* 8.3* 8.4* 8.7* 8.4*  MG  --  1.5* 2.0 1.7 1.6*  PHOS  --  2.5 3.4 2.3* 2.3*   GFR: Estimated Creatinine Clearance: 76.1 mL/min (by C-G formula based on SCr of 0.73 mg/dL). Liver Function Tests: Recent Labs  Lab 06/01/21 0503 06/03/21 0845  AST 16 14*  ALT 19 19  ALKPHOS 99 94  BILITOT 0.7 0.6  PROT 4.1* 4.6*  ALBUMIN 1.7* 1.9*   No results for input(s): LIPASE, AMYLASE in the last 168 hours. No results for input(s): AMMONIA in the last 168 hours. Coagulation Profile: No results for  input(s): INR, PROTIME in the last 168 hours. Cardiac Enzymes: No results for input(s): CKTOTAL, CKMB, CKMBINDEX, TROPONINI in the last 168 hours. BNP (last 3 results) No results for input(s): PROBNP in the last 8760 hours. HbA1C: No results for input(s): HGBA1C in the last 72 hours. CBG: Recent Labs  Lab 06/03/21 2026 06/04/21 0008 06/04/21 0350 06/04/21 0801 06/04/21 1135  GLUCAP 416* 224* 133* 89 188*   Lipid Profile: No results for input(s): CHOL, HDL, LDLCALC, TRIG, CHOLHDL, LDLDIRECT in the last 72 hours. Thyroid Function Tests: No results for input(s): TSH, T4TOTAL, FREET4, T3FREE, THYROIDAB in the last 72 hours. Anemia Panel: No results for input(s): VITAMINB12, FOLATE, FERRITIN, TIBC, IRON, RETICCTPCT in the last 72 hours.    Radiology Studies: I have reviewed all of the imaging during this hospital visit personally     Scheduled Meds:  Chlorhexidine Gluconate Cloth  6 each Topical Daily   feeding supplement  1 Container Oral TID BM   insulin aspart  0-20 Units Subcutaneous Q4H   insulin glargine  6 Units Subcutaneous Daily   levothyroxine  112 mcg Oral Once per day on Mon Tue Wed Thu Fri Sat   And   levothyroxine  56 mcg Oral Once per day on Sun   methylPREDNISolone (SOLU-MEDROL) injection  24 mg Intravenous Daily   Followed by    Melene Muller ON 06/06/2021] methylPREDNISolone (SOLU-MEDROL) injection  16 mg Intravenous Daily   Followed by   Melene Muller ON 06/11/2021] methylPREDNISolone (SOLU-MEDROL) injection  8 mg Intravenous Daily   pantoprazole  40 mg Oral BID   sodium chloride flush  10-40 mL Intracatheter Q12H   Continuous Infusions:  piperacillin-tazobactam (ZOSYN)  IV 3.375 g (06/04/21 1208)   TPN ADULT (ION) 35 mL/hr at 06/03/21 1744   TPN ADULT (ION)       LOS: 9 days        Lyndie Vanderloop Annett Gula, MD

## 2021-06-04 NOTE — Progress Notes (Signed)
Received critical lab from Abby, RN (4NICU) that hemoglobin 6.6; paged Dr. Bruna Potter, on call, for orders.

## 2021-06-04 NOTE — Progress Notes (Signed)
PHARMACY - TOTAL PARENTERAL NUTRITION CONSULT NOTE   Indication:  Intolerance to enteral feeding  Patient Measurements: Height: 5\' 2"  (157.5 cm) Weight: 109.1 kg (240 lb 8.4 oz) IBW/kg (Calculated) : 50.1 TPN AdjBW (KG): 64.9 Body mass index is 43.99 kg/m.  Assessment: 70 years of age female with known celiac disease, CHF, HTN, and DM admitted on 05/26/21 with perforated sigmoid diverticulitis and abscess as well as pulmonary embolism. IR unable to find a safe window for abscess drain placement and General Surgery recommending no surgery at this time. Attempted full liquids but had increased abdominal pain and loose stools so backed back down to clear liquids. Currently having bowel movements. Day #6 of hospital stay without adequate intake and patient has had N/V/D for 2 weeks prior to admission.   Daughter reports abdominal pain starting around 2 weeks ago which led to decreased oral intake which has gotten progressively worse leading up to admission. Since abdominal pain started, maybe would eat around 2 times per day.  Daughter reports patient would eat healthy (vegetables, fish, eggs) prior to abdominal pain. Drinks Glucerna shakes throughout the day.  Reports ~30 pound weight loss over the past 6 months. Patient is at risk for refeeding.   Patient started on CLD 6/17 AM- daughter reports she has had a cup of coffee and is fixing to drink broth once it cools.  Patient denies nausea or abdominal pain.  Discussed with CCS- continue TPN for now and repeat imaging tomorrow.    Glucose / Insulin: DM with erratic CBGs. Lantus 12>6 units daily.  rSSI 61 units used. Restarting steroids as IV while NPO.  Electrolytes: Na 136, K 3.8, Mg 1.6, Phos 2.3, CoCa down 10.1, CO2 29 Renal: SCr 0.73, BUN 10 Hepatic: LFTs/Tbili wnl, TG 108 Intake / Output; MIVF: UOP documentation not accurate, Last BM 6/17, no MIVF GI Imaging:  6/10 CT A/P: perforated diverticulitis w/ large abscess  6/18 CT A/P: persistent  and increasing abscess within lower abd/pelvis interposed between multiple loops of inflamed small bowel precluding perc drain GI Surgeries / Procedures:  6/16 EGD: normal esophagus, erythematous mucosa, duodenitis, healed ulcer  Central access: Double lumen PICC 6/16 TPN start date: 6/16  Nutritional Goals (per RD recommendation on 05/29/21): kCal: 2000-2200, Protein: 110-120, Fluid: >= 2L Goal TPN 90 ml/hr will provide 115g AA, 63g ILE, and 297g CHO for a total of 2100 kcal/day  Current Nutrition:  Clear liquids and TPN   Plan:  Unable to advance TPN until CBGs consistently <200  Continue TPN at 36mL/hr at 1800 Electrolytes in TPN: Na 11mEq/L, K 45mEq/L, decr Ca 19mEq/L, Mg 8/L, and Phos 79mmol/L. Cl:Ac 1:2 Add standard MVI and trace elements to TPN Continue Resistant SSI q4h  Insulin regimen per MD   Monitor TPN labs on Mon/Thurs  KPhos Neutral 500 mg PO x1 to avoid dextrose in IV products Mg 2 g IV x1  Thank you for involving pharmacy in this patient's care.  12m, PharmD, BCPS Clinical Pharmacist Clinical phone for 06/04/2021 until 3p is [phone] 06/04/2021 7:07 AM  **Pharmacist phone directory can be found on [email or web address] listed under [facility]**

## 2021-06-05 ENCOUNTER — Encounter (HOSPITAL_COMMUNITY): Payer: Self-pay | Admitting: Internal Medicine

## 2021-06-05 DIAGNOSIS — K5720 Diverticulitis of large intestine with perforation and abscess without bleeding: Secondary | ICD-10-CM | POA: Diagnosis not present

## 2021-06-05 DIAGNOSIS — I2699 Other pulmonary embolism without acute cor pulmonale: Secondary | ICD-10-CM | POA: Diagnosis not present

## 2021-06-05 DIAGNOSIS — I5032 Chronic diastolic (congestive) heart failure: Secondary | ICD-10-CM | POA: Diagnosis not present

## 2021-06-05 DIAGNOSIS — E119 Type 2 diabetes mellitus without complications: Secondary | ICD-10-CM | POA: Diagnosis not present

## 2021-06-05 LAB — CBC WITH DIFFERENTIAL/PLATELET
Abs Immature Granulocytes: 0.89 10*3/uL — ABNORMAL HIGH (ref 0.00–0.07)
Basophils Absolute: 0.1 10*3/uL (ref 0.0–0.1)
Basophils Relative: 0 %
Eosinophils Absolute: 0 10*3/uL (ref 0.0–0.5)
Eosinophils Relative: 0 %
HCT: 26.1 % — ABNORMAL LOW (ref 36.0–46.0)
Hemoglobin: 8.5 g/dL — ABNORMAL LOW (ref 12.0–15.0)
Immature Granulocytes: 6 %
Lymphocytes Relative: 16 %
Lymphs Abs: 2.3 10*3/uL (ref 0.7–4.0)
MCH: 30.9 pg (ref 26.0–34.0)
MCHC: 32.6 g/dL (ref 30.0–36.0)
MCV: 94.9 fL (ref 80.0–100.0)
Monocytes Absolute: 1 10*3/uL (ref 0.1–1.0)
Monocytes Relative: 7 %
Neutro Abs: 10.6 10*3/uL — ABNORMAL HIGH (ref 1.7–7.7)
Neutrophils Relative %: 71 %
Platelets: 209 10*3/uL (ref 150–400)
RBC: 2.75 MIL/uL — ABNORMAL LOW (ref 3.87–5.11)
RDW: 16.8 % — ABNORMAL HIGH (ref 11.5–15.5)
WBC: 14.8 10*3/uL — ABNORMAL HIGH (ref 4.0–10.5)
nRBC: 2.1 % — ABNORMAL HIGH (ref 0.0–0.2)

## 2021-06-05 LAB — BPAM RBC
Blood Product Expiration Date: 202207052359
ISSUE DATE / TIME: 202206190644
Unit Type and Rh: 7300

## 2021-06-05 LAB — TYPE AND SCREEN
ABO/RH(D): B POS
Antibody Screen: NEGATIVE
Unit division: 0

## 2021-06-05 LAB — COMPREHENSIVE METABOLIC PANEL
ALT: 25 U/L (ref 0–44)
AST: 24 U/L (ref 15–41)
Albumin: 2 g/dL — ABNORMAL LOW (ref 3.5–5.0)
Alkaline Phosphatase: 93 U/L (ref 38–126)
Anion gap: 7 (ref 5–15)
BUN: 9 mg/dL (ref 8–23)
CO2: 30 mmol/L (ref 22–32)
Calcium: 8.5 mg/dL — ABNORMAL LOW (ref 8.9–10.3)
Chloride: 99 mmol/L (ref 98–111)
Creatinine, Ser: 0.66 mg/dL (ref 0.44–1.00)
GFR, Estimated: >60 mL/min (ref >60)
Glucose, Bld: 143 mg/dL — ABNORMAL HIGH (ref 70–99)
Potassium: 4 mmol/L (ref 3.5–5.1)
Sodium: 136 mmol/L (ref 135–145)
Total Bilirubin: 0.5 mg/dL (ref 0.3–1.2)
Total Protein: 4.4 g/dL — ABNORMAL LOW (ref 6.5–8.1)

## 2021-06-05 LAB — PREALBUMIN: Prealbumin: 24.2 mg/dL (ref 18–38)

## 2021-06-05 LAB — GLUCOSE, CAPILLARY
Glucose-Capillary: 137 mg/dL — ABNORMAL HIGH (ref 70–99)
Glucose-Capillary: 138 mg/dL — ABNORMAL HIGH (ref 70–99)
Glucose-Capillary: 140 mg/dL — ABNORMAL HIGH (ref 70–99)
Glucose-Capillary: 159 mg/dL — ABNORMAL HIGH (ref 70–99)
Glucose-Capillary: 269 mg/dL — ABNORMAL HIGH (ref 70–99)
Glucose-Capillary: 313 mg/dL — ABNORMAL HIGH (ref 70–99)

## 2021-06-05 LAB — TRIGLYCERIDES: Triglycerides: 94 mg/dL (ref <150)

## 2021-06-05 LAB — MAGNESIUM: Magnesium: 1.9 mg/dL (ref 1.7–2.4)

## 2021-06-05 LAB — PHOSPHORUS: Phosphorus: 3.1 mg/dL (ref 2.5–4.6)

## 2021-06-05 MED ORDER — FAT EMUL FISH OIL/PLANT BASED 20% (SMOFLIPID)IV EMUL
INTRAVENOUS | Status: AC
  Filled 2021-06-05: qty 572.4

## 2021-06-05 MED ORDER — INSULIN REGULAR HUMAN 100 UNIT/ML IJ SOLN
INTRAMUSCULAR | Status: DC
  Filled 2021-06-05: qty 572.4

## 2021-06-05 NOTE — Progress Notes (Addendum)
[facility]  Date of Admission:  05/26/2021     CC: Complicated diverticulitis/intraabd abscess  Lines: Peripheral iv's  Abx: 6/11-c Pip-tazo  ASSESSMENT: Intraabd abscess Complicated diverticulitis Gib -- egd no source of bleeding Dvt/pe (new -- bilateral PE); s/p ivc filter 6/16 Acute hypoxic resp failure due to PE, improved/resolved Dm2 Obesity Hx Grave's disease with orbitopathy, on steroid taper for this Hx Grave's disease treatment since with hypothyroidism HFpEF   Patient had IR evaluation 6/12 and there was no safe window for drainage. She had repeat ct on 6/18 which showed increased abscess size. She has been started on tpn  Had gib but resolved. Previous egd this admission no active source of bleeding. Ivc filter placed 6/16. Heparin stopped 6/15  Surgery is also comanaging and given comorbid condition with dvt/pe and steroid use, currently would like to avoid invasive management  Would benefit from repeat IR input for drainage   PLAN: Continue piptazo Please rediscuss case with IR given repeat ct finding showing increased abscess size, for drainage option Duration and final antibiotics plan dependent on IR's input  I spent more than 35 minute reviewing data/chart, and coordinating care and >50% direct face to face time providing counseling/discussing diagnostics/treatment plan with patient   Principal Problem:   Diverticulitis of colon with perforation Active Problems:   Chronic diastolic CHF (congestive heart failure) (HCC)   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 (HCC)   Moderate persistent asthma, uncomplicated   Seizure (HCC)   Bilateral pulmonary embolism (HCC)   Allergies  Allergen Reactions   Gluten Meal     Celiac    Scheduled Meds:  Chlorhexidine Gluconate Cloth  6 each Topical Daily   feeding supplement  1 Container Oral TID BM   insulin aspart  0-20 Units Subcutaneous Q4H    insulin glargine  6 Units Subcutaneous Daily   levothyroxine  112 mcg Oral Once per day on Mon Tue Wed Thu Fri Sat   And   levothyroxine  56 mcg Oral Once per day on Sun   [START ON 06/06/2021] methylPREDNISolone (SOLU-MEDROL) injection  16 mg Intravenous Daily   Followed by   Melene Muller ON 06/11/2021] methylPREDNISolone (SOLU-MEDROL) injection  8 mg Intravenous Daily   pantoprazole  40 mg Oral BID   sodium chloride flush  10-40 mL Intracatheter Q12H   Continuous Infusions:  piperacillin-tazobactam (ZOSYN)  IV 3.375 g (06/05/21 1327)   TPN ADULT (ION) 35 mL/hr at 06/05/21 0528   TPN ADULT (ION)     PRN Meds:.Gerhardt's butt cream, liver oil-zinc oxide, morphine injection, ondansetron (ZOFRAN) IV, sodium chloride flush   SUBJECTIVE: Feelign well No n/v/diarrhea Started on tpn Has rectal tube  Review of Systems: ROS All other ROS was negative, except mentioned above     OBJECTIVE: Vitals:   06/05/21 0336 06/05/21 0436 06/05/21 0645 06/05/21 0900  BP: (!) 134/94 128/75 121/71 113/69  Pulse: 73 73 64   Resp: 14 13 16    Temp:  98.2 F (36.8 C)  97.7 F (36.5 C)  TempSrc:  Oral  Axillary  SpO2: 98% 96% 97%   Weight:      Height:       Body mass index is 43.99 kg/m.  Physical Exam General/constitutional: no distress, pleasant HEENT: chronic stable exophthalmos Neck supple CV: rrr no mrg Lungs: clear to auscultation, normal respiratory effort Abd: Soft, Nontender Ext: no edema Skin: No Rash Neuro: nonfocal MSK: no peripheral joint swelling/tenderness/warmth; back spines  nontender   Lab Results Lab Results  Component Value Date   WBC 14.8 (H) 06/05/2021   HGB 8.5 (L) 06/05/2021   HCT 26.1 (L) 06/05/2021   MCV 94.9 06/05/2021   PLT 209 06/05/2021    Lab Results  Component Value Date   CREATININE 0.66 06/05/2021   BUN 9 06/05/2021   NA 136 06/05/2021   K 4.0 06/05/2021   CL 99 06/05/2021   CO2 30 06/05/2021    Lab Results  Component Value Date   ALT 25  06/05/2021   AST 24 06/05/2021   ALKPHOS 93 06/05/2021   BILITOT 0.5 06/05/2021      Microbiology: Recent Results (from the past 240 hour(s))  Gastrointestinal Panel by PCR , Stool     Status: None   Collection Time: 05/26/21  4:59 PM   Specimen: Per Rectum; Stool  Result Value Ref Range Status   Campylobacter species NOT DETECTED NOT DETECTED Final   Plesimonas shigelloides NOT DETECTED NOT DETECTED Final   Salmonella species NOT DETECTED NOT DETECTED Final   Yersinia enterocolitica NOT DETECTED NOT DETECTED Final   Vibrio species NOT DETECTED NOT DETECTED Final   Vibrio cholerae NOT DETECTED NOT DETECTED Final   Enteroaggregative E coli (EAEC) NOT DETECTED NOT DETECTED Final   Enteropathogenic E coli (EPEC) NOT DETECTED NOT DETECTED Final   Enterotoxigenic E coli (ETEC) NOT DETECTED NOT DETECTED Final   Shiga like toxin producing E coli (STEC) NOT DETECTED NOT DETECTED Final   Shigella/Enteroinvasive E coli (EIEC) NOT DETECTED NOT DETECTED Final   Cryptosporidium NOT DETECTED NOT DETECTED Final   Cyclospora cayetanensis NOT DETECTED NOT DETECTED Final   Entamoeba histolytica NOT DETECTED NOT DETECTED Final   Giardia lamblia NOT DETECTED NOT DETECTED Final   Adenovirus F40/41 NOT DETECTED NOT DETECTED Final   Astrovirus NOT DETECTED NOT DETECTED Final   Norovirus GI/GII NOT DETECTED NOT DETECTED Final   Rotavirus A NOT DETECTED NOT DETECTED Final   Sapovirus (I, II, IV, and V) NOT DETECTED NOT DETECTED Final    Comment: Performed at [facility], [address] [place], [place] [address]  Resp Panel by RT-PCR (Flu A&B, Covid) Nasopharyngeal Swab     Status: None   Collection Time: 05/26/21  5:19 PM   Specimen: Nasopharyngeal Swab; Nasopharyngeal(NP) swabs in vial transport medium  Result Value Ref Range Status   SARS Coronavirus 2 by RT PCR NEGATIVE NEGATIVE Final    Comment: (NOTE) SARS-CoV-2 target nucleic acids are NOT DETECTED.  The SARS-CoV-2 RNA is  generally detectable in upper respiratory specimens during the acute phase of infection. The lowest concentration of SARS-CoV-2 viral copies this assay can detect is 138 copies/mL. A negative result does not preclude SARS-Cov-2 infection and should not be used as the sole basis for treatment or other patient management decisions. A negative result may occur with  improper specimen collection/handling, submission of specimen other than nasopharyngeal swab, presence of viral mutation(s) within the areas targeted by this assay, and inadequate number of viral copies(<138 copies/mL). A negative result must be combined with clinical observations, patient history, and epidemiological information. The expected result is Negative.  Fact Sheet for Patients:  BloggerCourse.com  Fact Sheet for Healthcare Providers:  SeriousBroker.it  This test is no t yet approved or cleared by the [place] FDA and  has been authorized for detection and/or diagnosis of SARS-CoV-2 by FDA under an Emergency Use Authorization (EUA). This EUA will remain  in effect (meaning this test can be  used) for the duration of the COVID-19 declaration under Section 564(b)(1) of the Act, 21 U.S.C.section 360bbb-3(b)(1), unless the authorization is terminated  or revoked sooner.       Influenza A by PCR NEGATIVE NEGATIVE Final   Influenza B by PCR NEGATIVE NEGATIVE Final    Comment: (NOTE) The Xpert Xpress SARS-CoV-2/FLU/RSV plus assay is intended as an aid in the diagnosis of influenza from Nasopharyngeal swab specimens and should not be used as a sole basis for treatment. Nasal washings and aspirates are unacceptable for Xpert Xpress SARS-CoV-2/FLU/RSV testing.  Fact Sheet for Patients: BloggerCourse.com  Fact Sheet for Healthcare Providers: SeriousBroker.it  This test is not yet approved or cleared by the [place] FDA and has been authorized for detection and/or diagnosis of SARS-CoV-2 by FDA under an Emergency Use Authorization (EUA). This EUA will remain in effect (meaning this test can be used) for the duration of the COVID-19 declaration under Section 564(b)(1) of the Act, 21 U.S.C. section 360bbb-3(b)(1), unless the authorization is terminated or revoked.  Performed at [facility], [address] [place], [place] [address]   Surgical PCR screen     Status: None   Collection Time: 05/27/21  1:29 AM   Specimen: Nasal Mucosa; Nasal Swab  Result Value Ref Range Status   MRSA, PCR NEGATIVE NEGATIVE Final   Staphylococcus aureus NEGATIVE NEGATIVE Final    Comment: (NOTE) The Xpert SA Assay (FDA approved for NASAL specimens in patients 59 years of age and older), is one component of a comprehensive surveillance program. It is not intended to diagnose infection nor to guide or monitor treatment. Performed at [facility], [address] [place], [place] [address]   C Difficile Quick Screen w PCR reflex     Status: None   Collection Time: 05/30/21  4:57 PM   Specimen: Per Rectum; Stool  Result Value Ref Range Status   C Diff antigen NEGATIVE NEGATIVE Final   C Diff toxin NEGATIVE NEGATIVE Final   C Diff interpretation No C. difficile detected.  Final    Comment: Performed at [facility], [address], [place], [place] [address]     Serology:   Imaging: If present, new imagings (plain films, ct scans, and mri) have been personally visualized and interpreted; radiology reports have been reviewed. Decision making incorporated into the Impression / Recommendations.  6/18 abd ct Stomach/Bowel: Rectal tube is noted within the rectum. The sigmoid colon again demonstrates diverticulosis although the degree of inflammatory change has improved in the interval from the prior exam. The more proximal colon is unremarkable. Appendix is well visualized and within  normal limits. Extraluminal air is noted surrounding the colon in the pelvis slightly increased in the interval from the prior exam. Additionally a large air-fluid collection is again identified interposed between the small bowel loops and uterus adjacent to the sigmoid colon. This now measures 10.5 x 6.8 cm in greatest transverse and AP dimensions and extends for at least 6.7 cm in craniocaudad projection (increased from previous). This is again consistent with a large abscess. The abscess is again draped with multiple loops of small bowel precluding percutaneous drainage. The surrounding small bowel loops again demonstrates some mild inflammatory thickening.  Raymondo Band, MD [facility] [phone] pager    06/05/2021, 1:31 PM

## 2021-06-05 NOTE — Progress Notes (Signed)
PHARMACY - TOTAL PARENTERAL NUTRITION CONSULT NOTE   Indication:  Intolerance to enteral feeding  Patient Measurements: Height: 5\' 2"  (157.5 cm) Weight: 109.1 kg (240 lb 8.4 oz) IBW/kg (Calculated) : 50.1 TPN AdjBW (KG): 64.9 Body mass index is 43.99 kg/m.  Assessment: 70 years of age female with known celiac disease, CHF, HTN, and DM admitted on 05/26/21 with perforated sigmoid diverticulitis and abscess as well as pulmonary embolism. IR unable to find a safe window for abscess drain placement and General Surgery recommending no surgery at this time. Attempted full liquids but had increased abdominal pain and loose stools so backed back down to clear liquids. Currently having bowel movements. Day #6 of hospital stay without adequate intake and patient has had N/V/D for 2 weeks prior to admission.   Daughter reports abdominal pain starting around 2 weeks ago which led to decreased oral intake which has gotten progressively worse leading up to admission. Since abdominal pain started, maybe would eat around 2 times per day.  Daughter reports patient would eat healthy (vegetables, fish, eggs) prior to abdominal pain. Drinks Glucerna shakes throughout the day.  Reports ~30 pound weight loss over the past 6 months. Patient is at risk for refeeding.     Glucose / Insulin: DM with erratic CBGs, 89-259. Lantus 12>6 units daily.  rSSI 28 units used. Restarting steroids as IV while NPO.  Electrolytes: Na 136, K 4, Mg 1.9, Phos 3.1, CoCa down 10.1, CO2 30 Renal: SCr 0.66, BUN 9 Hepatic: LFTs/Tbili wnl, TG 108, albumin 2, prealbumin 24.2 Intake / Output; MIVF: UOP 0.8 ml/kg/hr, Last BM 6/19, no MIVF GI Imaging:  6/10 CT A/P: perforated diverticulitis w/ large abscess  6/18 CT A/P: persistent and increasing abscess within lower abd/pelvis interposed between multiple loops of inflamed small bowel precluding perc drain GI Surgeries / Procedures:  6/16 EGD: normal esophagus, erythematous mucosa,  duodenitis, healed ulcer  Central access: Double lumen PICC 6/16 TPN start date: 6/16  Nutritional Goals (per RD recommendation on 05/29/21): kCal: 2000-2200, Protein: 110-120, Fluid: >= 2L Goal TPN 90 ml/hr will provide 115g AA, 63g ILE, and 297g CHO for a total of 2100 kcal/day  Current Nutrition:  Clear liquids and TPN   Plan:  Increase TPN to 65mL/hr at 1800 Electrolytes in TPN: Na 23mEq/L, decr K 68mEq/L, decr Ca 48mEq/L, Mg 8/L, and Phos 63mmol/L. Cl:Ac 1:1 Add standard MVI and trace elements to TPN Add 10 units regular insulin to TPN Continue Resistant SSI q4h  Lantus 6 units SQ daily per MD - no plan to adjust today per Dr 12m Monitor TPN labs on Mon/Thurs, CMP in am   Thank you for involving pharmacy in this patient's care.  Ella Jubilee, PharmD, BCPS Clinical Pharmacist Clinical phone for 06/05/2021 until 3p is [phone] 06/05/2021 7:22 AM  **Pharmacist phone directory can be found on [email or web address] listed under [facility]**

## 2021-06-05 NOTE — Progress Notes (Signed)
Physical Therapy Treatment Patient Details Name: [name] MRN: [id number] DOB: 01/21/1951 Today's Date: 06/05/2021    History of Present Illness 70 y.o. female admitted on 05/26/21 for sigmoid diverticulitis with perforation and abscess, IR consulted for surgery vs drain placement and had an unsucessful attempt on 05/28/21.  Bil PE started on IV heparin.  PMH including anemia, CHF, DM, graves disease, HTN, obesity, stroke, seizure, and thyroid disease.    PT Comments    The pt is making excellent progress with strength, mobility, and activity tolerance this morning. Today's session was focused on activation and strengthening of LE both in the form of bed-level exercises and OOB mobility. The pt was able to complete a series of sit-stand transfers, progressing from modA to minA or minG at times with max cues, effort, and increased time to power up. The pt relies heavily on UE support and truncal movements to generate momentum for LE movement for stepping. Focus on isolated LE strengthening with standing exercises later in session with good performance and improvement in clearance. The pt will continue to benefit from maximal mobility as well as skilled PT to further progress strengthening and mobility.   HR max of 145 with standing exercises, returned to 100s within 5-10 seconds of seated rest.     Follow Up Recommendations  CIR     Equipment Recommendations  Rolling walker with 5" wheels;Wheelchair (measurements PT);Wheelchair cushion (measurements PT);Hospital bed    Recommendations for Other Services       Precautions / Restrictions Precautions Precautions: Fall Precaution Comments: flexiseal Restrictions Weight Bearing Restrictions: No    Mobility  Bed Mobility Overal bed mobility: Needs Assistance Bed Mobility: Supine to Sit     Supine to sit: Min assist;HOB elevated     General bed mobility comments: minA with sequential cues and HOB raised. Pt able to complete  movement with BLE and reaching to pull on therapist to raise trunk from bed. minA to scoot to EOB    Transfers Overall transfer level: Needs assistance Equipment used: Rolling walker (2 wheeled) Transfers: Sit to/from Stand Sit to Stand: Mod assist;Min assist         General transfer comment: modA from low surface, progressed to minA or minG at times with max cues and slightly elevated surface. completed x8 through session  Ambulation/Gait Ambulation/Gait assistance: Min assist Gait Distance (Feet): 6 Feet Assistive device: Rolling walker (2 wheeled) Gait Pattern/deviations: Step-to pattern;Decreased stride length;Trunk flexed Gait velocity: decreased Gait velocity interpretation: <1.31 ft/sec, indicative of household ambulator General Gait Details: pt with significant truncal movement and rotation to generate steps, very little isolated hip flexion. minimal clearance and stride length       Balance Overall balance assessment: Needs assistance Sitting-balance support: No upper extremity supported;Feet supported Sitting balance-Leahy Scale: Fair     Standing balance support: Bilateral upper extremity supported;During functional activity Standing balance-Leahy Scale: Poor Standing balance comment: reliant on support in standing.                            Cognition Arousal/Alertness: Awake/alert Behavior During Therapy: Flat affect Overall Cognitive Status: Impaired/Different from baseline Area of Impairment: Memory;Following commands;Safety/judgement;Problem solving                   Current Attention Level: Sustained Memory: Decreased short-term memory Following Commands: Follows one step commands with increased time Safety/Judgement: Decreased awareness of safety Awareness: Intellectual Problem Solving: Difficulty sequencing;Requires verbal cues;Requires tactile cues  General Comments: pt alert and oriented, in better spirits and with improved  motivation. The pt was able to follow all commands (or make attempt) with slightly increased time. needed prompts repeated at times, needing simple instructions as she has trouble retaining multiple pieces of information      Exercises General Exercises - Lower Extremity Ankle Circles/Pumps: AROM;Both;20 reps;Supine Quad Sets: AROM;Both;10 reps;Supine Heel Slides: AROM;Both;5 reps;Supine Hip Flexion/Marching: AROM;AAROM;Both;10 reps;Standing (2 sets of 5)    General Comments        Pertinent Vitals/Pain Pain Assessment: No/denies pain Pain Intervention(s): Monitored during session     PT Goals (current goals can now be found in the care plan section) Acute Rehab PT Goals Patient Stated Goal: to get stronger PT Goal Formulation: With patient Time For Goal Achievement: 06/13/21 Potential to Achieve Goals: Good Progress towards PT goals: Progressing toward goals    Frequency    Min 4X/week      PT Plan Frequency needs to be updated       AM-PAC PT "6 Clicks" Mobility   Outcome Measure  Help needed turning from your back to your side while in a flat bed without using bedrails?: A Little Help needed moving from lying on your back to sitting on the side of a flat bed without using bedrails?: A Little Help needed moving to and from a bed to a chair (including a wheelchair)?: A Lot Help needed standing up from a chair using your arms (e.g., wheelchair or bedside chair)?: A Lot Help needed to walk in hospital room?: A Little Help needed climbing 3-5 steps with a railing? : Total 6 Click Score: 14    End of Session Equipment Utilized During Treatment: Gait belt;Oxygen Activity Tolerance: Patient limited by fatigue;Patient tolerated treatment well Patient left: in chair;with call bell/phone within reach;with chair alarm set Nurse Communication: Mobility status PT Visit Diagnosis: Muscle weakness (generalized) (M62.81);Difficulty in walking, not elsewhere classified  (R26.2);Pain     Time: 4098-1191 PT Time Calculation (min) (ACUTE ONLY): 37 min  Charges:  $Gait Training: 8-22 mins $Therapeutic Exercise: 8-22 mins                     Rolm Baptise, PT, DPT   Acute Rehabilitation Department Pager #: [phone]   Gaetana Michaelis 06/05/2021, 9:28 AM

## 2021-06-05 NOTE — H&P (Signed)
Chief Complaint: Patient was seen in consultation today for image guided aspiration and possible drain placement for intra-abdominal fluid collection Chief Complaint  Patient presents with   Abdominal Pain   at the request of  Dr. Cathlean Sauer, Jerilynn Mages.   Referring Physician(s): Dr. Cathlean Sauer, Jerilynn Mages.   Supervising Physician: Sandi Mariscal  Patient Status: [facility] - In-pt  History of Present Illness: [name] is a 70 y.o. female with extensive PMH listed below who presented to ED on 05/26/2021 due to abdominal pain, nausea, vomiting, and diarrhea for 2 weeks, was found to have a perforated sigmoid diverticulitis with abscess and bilateral pulmonary embolism. Patient was started on abx and heparin and was admitted, hospital course was complicated by acute GI bleeding after initiation of heparin. Patient s/p EGD with GI, no source of acute  bleeding found. Patient is s/p IVC filter placement with IR on 06/01/2021.   IR was consulted on 05/27/2021 for an aspiration and possible drain placement of the abscess, no safe window for access into fluid collection was found at the time of the initial consultation.  Patient has been receiving IV antibiotics and clinically improving, however, follow-up CT on 6/18 showed increased abscess size despite IV antibiotics.  Infectious disease recommended rediscussing with IR for possible aspiration and drain placement.  IR was requested for image guided aspiration and possible drain placement for the intra-abdominal/pelvic fluid collection.  Patient laying in bed, not in acute distress.  Reports she is very cold.  Denise headache, fever, shortness of breath, cough, chest pain, abdominal pain, nausea ,vomiting, and bleeding.   Past Medical History:  Diagnosis Date   Anemia    Celiac disease    CHF (congestive heart failure) ([place])    Depression    Diabetes mellitus without complication ([place])    Edema    lower extremities   Emphysema of lung ([place])    Enlarged heart     begining stage   Graves disease    Headache(784.0) 05/18/2013   Heart murmur    Hematochezia    Hemorrhoid    Hyperlipidemia    Hypertension    Migraine    Obese    OSA (obstructive sleep apnea)    Seizures ([place])    Sleep apnea    Stroke ([place])    left P-O ICH 2007   Thyroid disease    hypothyroidism   Ulcer    Vitamin D deficiency     Past Surgical History:  Procedure Laterality Date   CESAREAN SECTION     ESOPHAGOGASTRODUODENOSCOPY (EGD) WITH PROPOFOL N/A 06/01/2021   Procedure: ESOPHAGOGASTRODUODENOSCOPY (EGD) WITH PROPOFOL;  Surgeon: Carol Ada, MD;  Location: [facility];  Service: Endoscopy;  Laterality: N/A;   HEMORRHOID SURGERY     IR IVC FILTER PLMT / S&I /IMG GUID/MOD SED  06/01/2021   IR US GUIDE VASC ACCESS RIGHT  06/01/2021   IUD REMOVAL     TONSILLECTOMY     VEIN SURGERY      Allergies: Gluten meal  Medications: Prior to Admission medications   Medication Sig Start Date End Date Taking? Authorizing Provider  albuterol (VENTOLIN HFA) 108 (90 Base) MCG/ACT inhaler Inhale 2 puffs into the lungs every 6 (six) hours as needed for wheezing or shortness of breath.   Yes [provider]  amLODipine (NORVASC) 5 MG tablet Take 1 tablet (5 mg total) by mouth daily. 10/04/20  Yes Nahser, Wonda Cheng, MD  carvedilol (COREG) 25 MG tablet Take 1 tablet (25 mg total) by mouth  2 (two) times daily with a meal. 12/05/12  Yes Nahser, Wonda Cheng, MD  esomeprazole (NEXIUM) 20 MG capsule Take 1 capsule by mouth daily.   Yes [provider]  furosemide (LASIX) 20 MG tablet Take 1 tablet (20 mg total) by mouth daily. 05/22/21  Yes Hans Eden, NP  levothyroxine (SYNTHROID) 112 MCG tablet Take 1 tablet by mouth Monday through Saturday and 1/2 tablet on Sunday. 11/08/20  Yes Elayne Snare, MD  metFORMIN (GLUCOPHAGE) 500 MG tablet Take 500 mg by mouth 2 (two) times daily. 08/23/20  Yes [provider]  OVER THE COUNTER MEDICATION Take 1 tablet by mouth daily.  Medication: Vitamin b12 3000 mcg   Yes [provider]  potassium chloride (KLOR-CON) 10 MEQ tablet Take 2 tablets (20 mEq total) by mouth daily. 11/07/20  Yes Nahser, Wonda Cheng, MD  predniSONE (DELTASONE) 20 MG tablet Take 60 mg by mouth daily with breakfast.   Yes [provider]  rosuvastatin (CRESTOR) 5 MG tablet Take 1 tablet (5 mg total) by mouth daily. 11/08/20  Yes Nahser, Wonda Cheng, MD  Blood Glucose Monitoring Suppl (ONETOUCH VERIO REFLECT) w/Device KIT 1 each by Other route as directed. 08/10/20   [provider]  Fluticasone-Salmeterol (ADVAIR) 250-50 MCG/DOSE AEPB Inhale 1 puff into the lungs daily as needed for allergies.    [provider]  glucose blood (ONETOUCH VERIO) test strip Use as intstructed to check blood sugar once a day Dx Code E11.9 02/18/21   Elayne Snare, MD  Lancets [facility] ULTRASOFT) lancets Use as instructed 10/13/20   Elayne Snare, MD  meclizine (ANTIVERT) 25 MG tablet Take 1 tablet by mouth daily as needed for dizziness.    [provider]     Family History  Problem Relation Age of Onset   Heart disease Father    Diabetes Father    Heart failure Father    Diabetes Mother    Thyroid disease Sister    Diabetes Brother     Social History   Socioeconomic History   Marital status: Widowed    Spouse name: Not on file   Number of children: Not on file   Years of education: Not on file   Highest education level: Not on file  Occupational History   Not on file  Tobacco Use   Smoking status: Never   Smokeless tobacco: Never  Vaping Use   Vaping Use: Never used  Substance and Sexual Activity   Alcohol use: No   Drug use: No   Sexual activity: Never  Other Topics Concern   Not on file  Social History Narrative   Not on file   Social Determinants of Health   Financial Resource Strain: Not on file  Food Insecurity: Not on file  Transportation Needs: Not on file  Physical Activity: Not on file  Stress: Not  on file  Social Connections: Not on file     Review of Systems: A 12 point ROS discussed and pertinent positives are indicated in the HPI above.  All other systems are negative.   Vital Signs: BP 113/69 (BP Location: Right Arm)   Pulse 64   Temp 97.7 F (36.5 C) (Axillary)   Resp 16   Ht _0  (1.575 m)   Wt 240 lb 8.4 oz (109.1 kg)   SpO2 97%   BMI 43.99 kg/m   Physical Exam Vitals reviewed.  Constitutional:      General: She is not in acute distress.  Appearance: She is not ill-appearing.  HENT:     Head: Normocephalic and atraumatic.  Cardiovascular:     Rate and Rhythm: Normal rate and regular rhythm.     Heart sounds: Normal heart sounds.  Pulmonary:     Effort: Pulmonary effort is normal.     Breath sounds: Normal breath sounds.  Abdominal:     General: Bowel sounds are normal.     Palpations: Abdomen is soft.  Skin:    General: Skin is warm and dry.     Coloration: Skin is not cyanotic or jaundiced.  Neurological:     Mental Status: She is alert and oriented to person, place, and time.  Psychiatric:        Mood and Affect: Mood normal.        Behavior: Behavior normal.    MD Evaluation Airway: WNL Heart: WNL Abdomen: WNL Chest/ Lungs: WNL ASA  Classification: 3 Mallampati/Airway Score: Two  Imaging: DG Chest 2 View  Result Date: 05/26/2021 CLINICAL DATA:  Hypoxia. EXAM: CHEST - 2 VIEW COMPARISON:  May 23, 2021. FINDINGS: The heart size and mediastinal contours are within normal limits. Both lungs are clear. The visualized skeletal structures are unremarkable. IMPRESSION: No active cardiopulmonary disease. Electronically Signed   By: Marijo Conception M.D.   On: 05/26/2021 16:48   CT ABDOMEN PELVIS W CONTRAST  Result Date: 06/03/2021 CLINICAL DATA:  History of diverticulitis, follow-up exam EXAM: CT ABDOMEN AND PELVIS WITH CONTRAST TECHNIQUE: Multidetector CT imaging of the abdomen and pelvis was performed using the standard protocol following bolus  administration of intravenous contrast. CONTRAST:  192m OMNIPAQUE IOHEXOL 300 MG/ML  SOLN COMPARISON:  05/26/2021 FINDINGS: Lower chest: Lung bases demonstrate some minimal atelectatic changes on the right. Previously seen pulmonary emboli are again identified right greater than left. Hepatobiliary: Fatty infiltration of the liver is noted. A small cyst is noted within the right lobe. Gallbladder is unremarkable. Pancreas: Unremarkable. No pancreatic ductal dilatation or surrounding inflammatory changes. Spleen: Normal in size without focal abnormality. Adrenals/Urinary Tract: Adrenal glands are unremarkable. Kidneys demonstrate a normal enhancement pattern bilaterally. No definitive renal calculi are seen. No obstructive changes are noted. Normal excretion is noted on delayed images. The bladder is well distended. Stomach/Bowel: Rectal tube is noted within the rectum. The sigmoid colon again demonstrates diverticulosis although the degree of inflammatory change has improved in the interval from the prior exam. The more proximal colon is unremarkable. Appendix is well visualized and within normal limits. Extraluminal air is noted surrounding the colon in the pelvis slightly increased in the interval from the prior exam. Additionally a large air-fluid collection is again identified interposed between the small bowel loops and uterus adjacent to the sigmoid colon. This now measures 10.5 x 6.8 cm in greatest transverse and AP dimensions and extends for at least 6.7 cm in craniocaudad projection. This is again consistent with a large abscess. The abscess is again draped with multiple loops of small bowel precluding percutaneous drainage. The surrounding small bowel loops again demonstrates some mild inflammatory thickening. More proximal small bowel is within normal limits. No obstructive changes are seen. The stomach is unremarkable. Vascular/Lymphatic: Aortic atherosclerosis. No enlarged abdominal or pelvic lymph  nodes. IVC filter is now seen in satisfactory position. Reproductive: Uterus and bilateral adnexa are unremarkable. Other: No free fluid is noted within the pelvis. No herniation is seen. Musculoskeletal: Degenerative changes of lumbar spine are noted. No acute bony abnormality is seen. IMPRESSION: Persistent and increasing abscess within the  lower abdomen/pelvis interposed between multiple loops of inflamed small bowel in the uterus. The abscess is again draped with multiple loops of inflamed small bowel precluding percutaneous drainage. Persistent bilateral pulmonary emboli right greater than left. Fatty liver. Previously seen diverticulitis inflammatory change has improved in the interval from the prior study. Electronically Signed   By: Inez Catalina M.D.   On: 06/03/2021 15:21   CT Abdomen Pelvis W Contrast  Result Date: 05/26/2021 CLINICAL DATA:  Two week history of abdominal pain, nausea, vomiting and diarrhea. Abdominal tenderness and hypotensive. EXAM: CT ABDOMEN AND PELVIS WITH CONTRAST TECHNIQUE: Multidetector CT imaging of the abdomen and pelvis was performed using the standard protocol following bolus administration of intravenous contrast. CONTRAST:  23m OMNIPAQUE IOHEXOL 300 MG/ML  SOLN COMPARISON:  None. FINDINGS: Lower chest: The lung bases are clear of an acute process. No pleural effusions or pulmonary lesions. Findings of bilateral central and lower lobe pulmonary emboli are noted. A dedicated CT chest may be helpful for further evaluation but there does appear to be flattening of the left ventricle which could suggest right heart strain. Hepatobiliary: No hepatic lesions or intrahepatic biliary dilatation. The gallbladder is grossly normal. No common bile duct dilatation. Pancreas: No mass, inflammation or ductal dilatation. Spleen: Normal size. No focal lesions. Adrenals/Urinary Tract: Adrenal glands and kidneys are unremarkable. The bladder is unremarkable. Stomach/Bowel: The stomach,  duodenum and small bowel are unremarkable. No findings for small bowel obstruction. There are some mildly inflamed distal ileal loops of small bowel in the pelvis due to a large pelvic abscess. The terminal ileum is normal. The appendix is. There is sigmoid colon diverticulosis and I suspect there is perforated diverticulitis involving the mid sigmoid colon. There is a large complex abscess anterior to the uterus and in between distal small bowel loops. This measures a maximum of 9.2 x 5.8 cm. There is also some associated free pelvic fluid. Vascular/Lymphatic: Moderate scattered atherosclerotic changes involving the aorta and iliac arteries. No aneurysm. The major venous structures are patent. No mesenteric or retroperitoneal mass or adenopathy. No pelvic adenopathy or inguinal adenopathy. Reproductive: The uterus and ovaries are unremarkable. There are calcifications associated with both ovaries. Other: Small amount of free pelvic fluid. Musculoskeletal: No significant bony findings. IMPRESSION: 1. CT findings consistent with perforated diverticulitis involving the mid sigmoid colon with a large (9.2 x 5.8 cm) complex abscess anterior to the uterus and in between distal small bowel loops. 2. Bilateral central and lower lobe pulmonary emboli. Dedicated CTA chest for further evaluation may be helpful if clinically necessary. There is flattening of the left ventricle which suggesting right heart strain. These results were called by telephone at the time of interpretation on 05/26/2021 at 8:05 pm to [facility], who verbally acknowledged these results. Aortic Atherosclerosis (ICD10-I70.0). Electronically Signed   By: PMarijo SanesM.D.   On: 05/26/2021 20:05   IR IVC FILTER PLMT / S&I /Burke KeelsGUID/MOD SED  Result Date: 06/01/2021 INDICATION: PE/DVT.  Acute GI bleed. EXAM: Retrievable IVC filter placement MEDICATIONS: None. ANESTHESIA/SEDATION: One mg IV Versed; 25 mcg IV Fentanyl Moderate Sedation Time:  10  minutes The patient was continuously monitored during the procedure by the interventional radiology nurse under my direct supervision. FLUOROSCOPY TIME:  Fluoroscopy Time: 0 minutes 48 seconds (76 mGy). COMPLICATIONS: None immediate. PROCEDURE: Informed written consent was obtained from the patient after a thorough discussion of the procedural risks, benefits and alternatives. All questions were addressed. Maximal Sterile Barrier Technique was utilized including  caps, mask, sterile gowns, sterile gloves, sterile drape, hand hygiene and skin antiseptic. A timeout was performed prior to the initiation of the procedure. Patient positioned supine on the angiography table. Right neck prepped and draped in usual sterile fashion. All elements of maximal sterile barrier were utilized including, cap, mask, sterile gown, sterile gloves, large sterile drape, hand scrubbing and 2% Chlorhexidine for skin cleaning. The right internal jugular vein was evaluated with ultrasound and shown to be patent. A permanent ultrasound image was obtained and placed in the patient's medical record. Using sterile gel and a sterile probe cover, the right internal jugular vein was entered with a 21 ga needle during real time ultrasound guidance. 21 gauge needle exchanged for a transitional dilator set over 0.018 inch guidewire. Transitional dilator set exchanged for 10 fr sheath over 0.035 inch guidewire. Sheath placed to the infrarenal IVC and CO2 venogram performed to delineate the position of the renal veins. Retrievable Denali IVC filter deployed in the infrarenal location. Sheath removed and hemostasis achieved with manual compression. IMPRESSION: Retrievable IVC filter placement as above. PLAN: This IVC filter is potentially retrievable. The patient will be assessed for filter retrieval by Interventional Radiology in approximately 8-12 weeks. Further recommendations regarding filter retrieval, continued surveillance or declaration of device  permanence, will be made at that time. Electronically Signed   By: Miachel Roux M.D.   On: 06/01/2021 16:42   IR US Guide Vasc Access Right  Result Date: 06/01/2021 INDICATION: PE/DVT.  Acute GI bleed. EXAM: Retrievable IVC filter placement MEDICATIONS: None. ANESTHESIA/SEDATION: One mg IV Versed; 25 mcg IV Fentanyl Moderate Sedation Time:  10 minutes The patient was continuously monitored during the procedure by the interventional radiology nurse under my direct supervision. FLUOROSCOPY TIME:  Fluoroscopy Time: 0 minutes 48 seconds (76 mGy). COMPLICATIONS: None immediate. PROCEDURE: Informed written consent was obtained from the patient after a thorough discussion of the procedural risks, benefits and alternatives. All questions were addressed. Maximal Sterile Barrier Technique was utilized including caps, mask, sterile gowns, sterile gloves, sterile drape, hand hygiene and skin antiseptic. A timeout was performed prior to the initiation of the procedure. Patient positioned supine on the angiography table. Right neck prepped and draped in usual sterile fashion. All elements of maximal sterile barrier were utilized including, cap, mask, sterile gown, sterile gloves, large sterile drape, hand scrubbing and 2% Chlorhexidine for skin cleaning. The right internal jugular vein was evaluated with ultrasound and shown to be patent. A permanent ultrasound image was obtained and placed in the patient's medical record. Using sterile gel and a sterile probe cover, the right internal jugular vein was entered with a 21 ga needle during real time ultrasound guidance. 21 gauge needle exchanged for a transitional dilator set over 0.018 inch guidewire. Transitional dilator set exchanged for 10 fr sheath over 0.035 inch guidewire. Sheath placed to the infrarenal IVC and CO2 venogram performed to delineate the position of the renal veins. Retrievable Denali IVC filter deployed in the infrarenal location. Sheath removed and  hemostasis achieved with manual compression. IMPRESSION: Retrievable IVC filter placement as above. PLAN: This IVC filter is potentially retrievable. The patient will be assessed for filter retrieval by Interventional Radiology in approximately 8-12 weeks. Further recommendations regarding filter retrieval, continued surveillance or declaration of device permanence, will be made at that time. Electronically Signed   By: Miachel Roux M.D.   On: 06/01/2021 16:42   DG Chest Portable 1 View  Result Date: 05/23/2021 CLINICAL DATA:  Shortness of breath with  exertion EXAM: PORTABLE CHEST 1 VIEW COMPARISON:  01/23/2021 FINDINGS: No new consolidation or edema. No pleural effusion. Normal heart size. No acute osseous abnormality. IMPRESSION: No acute process in the chest. Electronically Signed   By: Macy Mis M.D.   On: 05/23/2021 20:43   ECHOCARDIOGRAM COMPLETE  Result Date: 05/27/2021    ECHOCARDIOGRAM REPORT   Patient Name:   Taelor VAMPLE Principato Date of Exam: 05/27/2021 Medical Rec #:  [id number]          Height:       62.0 in Accession #:    [id number]         Weight:       240.5 lb Date of Birth:  1951-10-21         BSA:          2.068 m Patient Age:    27 years           BP:           117/68 mmHg Patient Gender: F                  HR:           86 bpm. Exam Location:  Inpatient Procedure: 2D Echo, Cardiac Doppler and Color Doppler STAT ECHO Indications:    Pulmonary embolus  History:        Patient has prior history of Echocardiogram examinations, most                 recent 01/02/2012. CHF; Risk Factors:Sleep Apnea, Hypertension                 and Diabetes.  Sonographer:    Clayton Lefort RDCS (AE) Referring Phys: [id number] RALPH A NETTEY  Sonographer Comments: Patient is morbidly obese. Image acquisition challenging due to patient body habitus. Image acquistion challening due to patient movement. IMPRESSIONS  1. Left ventricular ejection fraction, by estimation, is 65 to 70%. The left ventricle has normal function.  The left ventricle has no regional wall motion abnormalities. There is severe concentric left ventricular hypertrophy. 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  2. RV incompletely visualized. Based on available views, RV basal systolic function appears relatively preserved. The mid-to-apcial RV free wall appears mildly hypokinetic. The right ventricular size is mildly enlarged. There is severely elevated pulmonary artery systolic pressure. The estimated right ventricular systolic pressure is 65 mmHg.  3. The mitral valve is degenerative. Trivial mitral valve regurgitation. No evidence of mitral stenosis. Severe mitral annular calcification.  4. The aortic valve is tricuspid. There is mild calcification of the aortic valve. There is mild thickening of the aortic valve. Aortic valve regurgitation is mild. Mild to moderate aortic valve sclerosis/calcification is present, without any evidence of aortic stenosis.  5. Aortic dilatation noted. There is mild dilatation of the ascending aorta, measuring 41 mm.  6. The inferior vena cava is normal in size with greater than 50% respiratory variability, suggesting right atrial pressure of 3 mmHg.  7. Left atrial size was mildly dilated.  8. Severely elevated PASP with mild RV dilation and systolic dysfunction suggestive of RV strain in the setting of acute PE. Comparison(s): Compared to prior TTE in 2013, the PASP is now severely elevated at 12mHg (previously normal) consistent with acute PE. FINDINGS  Left Ventricle: Left ventricular ejection fraction, by estimation, is 65 to 70%. The left ventricle has normal function. The left ventricle has no regional wall motion abnormalities. The left ventricular internal cavity size was  small. There is severe concentric left ventricular hypertrophy. 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 Right Ventricle: RV incompletely visualized. The right ventricular size is mildly enlarged. Right vetricular wall thickness was  not well visualized. Based on available views, RV basal systolic function appears relatively preserved. The mid free wall appears mildly hypokinetic on available views. There is severely elevated pulmonary artery systolic pressure. The tricuspid regurgitant velocity is 3.87 m/s, and with an assumed right atrial pressure of 5 mmHg, the estimated right ventricular systolic pressure is 73.5 mmHg. Left Atrium: Left atrial size was mildly dilated. Right Atrium: Right atrial size was normal in size. Pericardium: There is no evidence of pericardial effusion. Mitral Valve: The mitral valve is degenerative in appearance. There is moderate thickening of the mitral valve leaflet(s). There is moderate calcification of the mitral valve leaflet(s). Severe mitral annular calcification. Trivial mitral valve regurgitation. No evidence of mitral valve stenosis. Tricuspid Valve: The tricuspid valve is normal in structure. Tricuspid valve regurgitation is trivial. Aortic Valve: The aortic valve is tricuspid. There is mild calcification of the aortic valve. There is mild thickening of the aortic valve. Aortic valve regurgitation is mild. Aortic regurgitation PHT measures 635 msec. Mild to moderate aortic valve sclerosis/calcification is present, without any evidence of aortic stenosis. Aortic valve mean gradient measures 6.3 mmHg. Aortic valve peak gradient measures 10.6 mmHg. Aortic valve area, by VTI measures 2.75 cm. Pulmonic Valve: The pulmonic valve was grossly normal. Pulmonic valve regurgitation is trivial. Aorta: Aortic dilatation noted. There is mild dilatation of the ascending aorta, measuring 41 mm. Venous: The inferior vena cava is normal in size with greater than 50% respiratory variability, suggesting right atrial pressure of 3 mmHg. IAS/Shunts: No atrial level shunt detected by color flow Doppler.  LEFT VENTRICLE PLAX 2D LVIDd:         2.40 cm LVIDs:         1.80 cm LV PW:         1.90 cm LV IVS:        2.10 cm LVOT  diam:     2.20 cm LV SV:         68 LV SV Index:   33 LVOT Area:     3.80 cm  RIGHT VENTRICLE             IVC RV Basal diam:  2.50 cm     IVC diam: 1.40 cm RV S prime:     13.70 cm/s TAPSE (M-mode): 2.7 cm LEFT ATRIUM             Index       RIGHT ATRIUM           Index LA diam:        3.40 cm 1.64 cm/m  RA Area:     13.60 cm LA Vol (A2C):   41.8 ml 20.22 ml/m RA Volume:   26.80 ml  12.96 ml/m LA Vol (A4C):   48.5 ml 23.46 ml/m LA Biplane Vol: 44.7 ml 21.62 ml/m  AORTIC VALVE AV Area (Vmax):    2.55 cm AV Area (Vmean):   2.78 cm AV Area (VTI):     2.75 cm AV Vmax:           162.67 cm/s AV Vmean:          115.667 cm/s AV VTI:            0.248 m AV Peak Grad:      10.6 mmHg AV Mean Grad:  6.3 mmHg LVOT Vmax:         109.00 cm/s LVOT Vmean:        84.567 cm/s LVOT VTI:          0.179 m LVOT/AV VTI ratio: 0.72 AI PHT:            635 msec  AORTA Ao Root diam: 3.20 cm Ao Asc diam:  4.10 cm TRICUSPID VALVE TR Peak grad:   59.9 mmHg TR Vmax:        387.00 cm/s  SHUNTS Systemic VTI:  0.18 m Systemic Diam: 2.20 cm Gwyndolyn Kaufman MD Electronically signed by Gwyndolyn Kaufman MD Signature Date/Time: 05/27/2021/4:43:56 PM    Final    CT IMAGE GUIDED DRAINAGE BY PERCUTANEOUS CATHETER  Result Date: 05/28/2021 INDICATION: 69 year old female presents for possible drainage of lower abdominal/pelvic abscess EXAM: CT-GUIDED DRAINAGE OF PELVIC ABSCESS MODIFIED EXAM DISCONTINUATION FOR INABILITY TO PERFORM ABSCESS DRAINAGE MEDICATIONS: None ANESTHESIA/SEDATION: None COMPLICATIONS: None TECHNIQUE: Informed written consent was obtained from the patient after a thorough discussion of the procedural risks, benefits and alternatives. All questions were addressed. Maximal Sterile Barrier Technique was utilized including caps, mask, sterile gowns, sterile gloves, sterile drape, hand hygiene and skin antiseptic. A timeout was performed prior to the initiation of the procedure. PROCEDURE: Patient was positioned supine position  on the CT gantry table. Scout CT was performed for planning purposes. After careful review of the images, we withdrew from the procedure, as there is no safe window for access into the fluid collection at this time. FINDINGS: Fluid collection persists just superior to the urinary bladder and the uterus, with small bowel surrounding fluid and gas collection in the central mesentery of the low abdomen. There is no safe window for percutaneous needle puncture. We withdrew from the attempt. IMPRESSION: Attempted CT-guided drainage of abdominal/pelvic abscess identifying no safe window for attempt at this time. Exam was discontinued. Signed, Dulcy Fanny. Dellia Nims, RPVI [facility] Radiology Electronically Signed   By: Corrie Mckusick D.O.   On: 05/28/2021 12:12   VAS [place] LOWER EXTREMITY VENOUS (DVT)  Result Date: 05/31/2021  Lower Venous DVT Study Patient Name:  Guillermo VAMPLE Maziarz  Date of Exam:   05/31/2021 Medical Rec #: [id number]           Accession #:    [id number] Date of Birth: [birth year]          Patient Gender: F Patient Age:   69Y Exam Location:  [facility] Procedure:      VAS [place] LOWER EXTREMITY VENOUS (DVT) Referring Phys: [id number] RALPH A NETTEY --------------------------------------------------------------------------------  Indications: Pulmonary embolism.  Risk Factors: Immobility. Limitations: Body habitus, poor ultrasound/tissue interface and Patient positioning. Comparison Study: No previous exams Performing Technologist: Jody Hill RVT, RDMS  Examination Guidelines: A complete evaluation includes B-mode imaging, spectral Doppler, color Doppler, and power Doppler as needed of all accessible portions of each vessel. Bilateral testing is considered an integral part of a complete examination. Limited examinations for reoccurring indications may be performed as noted. The reflux portion of the exam is performed with the patient in reverse Trendelenburg.   +---------+---------------+---------+-----------+----------+-------------------+ RIGHT    CompressibilityPhasicitySpontaneityPropertiesThrombus Aging      +---------+---------------+---------+-----------+----------+-------------------+ CFV      Full           Yes      Yes                                      +---------+---------------+---------+-----------+----------+-------------------+  SFJ      Full                                                             +---------+---------------+---------+-----------+----------+-------------------+ FV Prox  Full           Yes      Yes                                      +---------+---------------+---------+-----------+----------+-------------------+ FV Mid   Full           Yes      Yes                                      +---------+---------------+---------+-----------+----------+-------------------+ FV DistalFull           Yes      Yes                                      +---------+---------------+---------+-----------+----------+-------------------+ PFV      Full                                                             +---------+---------------+---------+-----------+----------+-------------------+ POP                     Yes      Yes                  Patent by color and                                                       doppler             +---------+---------------+---------+-----------+----------+-------------------+ PTV      Full                                                             +---------+---------------+---------+-----------+----------+-------------------+ PERO                                                  not visualized on  this exam           +---------+---------------+---------+-----------+----------+-------------------+   Right Technical Findings: Right leg not evaluated. Not visualized segments include  peroneal veins.  +---------+---------------+---------+-----------+----------+-------------------+ LEFT     CompressibilityPhasicitySpontaneityPropertiesThrombus Aging      +---------+---------------+---------+-----------+----------+-------------------+ CFV      Full           Yes      Yes                                      +---------+---------------+---------+-----------+----------+-------------------+ SFJ      Full                                                             +---------+---------------+---------+-----------+----------+-------------------+ FV Prox  Full           Yes      Yes                                      +---------+---------------+---------+-----------+----------+-------------------+ FV Mid   Full           Yes      Yes                                      +---------+---------------+---------+-----------+----------+-------------------+ FV DistalFull           Yes      Yes                                      +---------+---------------+---------+-----------+----------+-------------------+ PFV      Full                                                             +---------+---------------+---------+-----------+----------+-------------------+ POP      None           No       No                   Acute               +---------+---------------+---------+-----------+----------+-------------------+ PTV      None           No       No                   Acute               +---------+---------------+---------+-----------+----------+-------------------+ PERO                                                  not visualized on  this exam           +---------+---------------+---------+-----------+----------+-------------------+   Left Technical Findings: Left leg not evaluated. Not visualized segments include peroneal veins.   Summary: BILATERAL: - No evidence of superficial  venous thrombosis in the lower extremities, bilaterally. -No evidence of popliteal cyst, bilaterally. RIGHT: - There is no evidence of deep vein thrombosis in the lower extremity.  LEFT: - Findings consistent with acute deep vein thrombosis involving the left popliteal vein, and left posterior tibial veins.  *See table(s) above for measurements and observations. Electronically signed by Harold Barban MD on 05/31/2021 at 7:48:20 PM.    Final    [place] EKG SITE RITE  Result Date: 05/31/2021 If Site Rite image not attached, placement could not be confirmed due to current cardiac rhythm.   Labs:  CBC: Recent Labs    06/02/21 1750 06/03/21 0312 06/03/21 1211 06/03/21 1226 06/03/21 2013 06/04/21 0400 06/05/21 0512  WBC 17.9* 16.3*  --  15.5*  --   --  14.8*  HGB 8.0* 7.2*   < > 7.3* 7.2* 6.6* 8.5*  HCT 24.6* 21.7*   < > 22.4* 22.1* 20.7* 26.1*  PLT 144* 157  --  176  --   --  209   < > = values in this interval not displayed.    COAGS: No results for input(s): INR, APTT in the last 8760 hours.  BMP: Recent Labs    11/07/20 1058 03/26/21 1927 06/02/21 0728 06/03/21 0845 06/04/21 0400 06/05/21 0500  NA 141   < > 137 137 136 136  K 4.1   < > 3.8 4.3 3.8 4.0  CL 103   < > 106 105 102 99  CO2 24   < > 21* _0 GLUCOSE 118*   < > 272* 236* 123* 143*  BUN 13   < > _1 CALCIUM 9.9   < > 8.4* 8.7* 8.4* 8.5*  CREATININE 1.06*   < > 1.05* 0.70 0.73 0.66  GFRNONAA 53*   < > 57* >60 >60 >60  GFRAA 61  --   --   --   --   --    < > = values in this interval not displayed.    LIVER FUNCTION TESTS: Recent Labs    05/27/21 0327 06/01/21 0503 06/03/21 0845 06/05/21 0500  BILITOT 1.0 0.7 0.6 0.5  AST 13* 16 14* 24  ALT _2 ALKPHOS 52 99 94 93  PROT 4.4* 4.1* 4.6* 4.4*  ALBUMIN 2.2* 1.7* 1.9* 2.0*    TUMOR MARKERS: No results for input(s): AFPTM, CEA, CA199, CHROMGRNA in the last 8760 hours.  Assessment and Plan: 70 y.o. female with a perforated sigmoid  diverticulitis with abscess.  IR was consulted on 05/27/2021 for an aspiration and possible drain placement of the abscess, no safe window for access into fluid collection was found at the time of the initial consultation.  Patient has been receiving IV antibiotics and clinically improving, however, follow-up CT on 6/18 showed increased abscess size despite IV antibiotics.  Infectious disease recommended rediscussing with IR for possible aspiration and drain placement.  IR was requested for image guided aspiration and possible drain placement for the intra-abdominal/pelvic fluid collection. Case was reviewed by Dr. Pascal Lux, who recommends placing a foley catheter to decompress the urinary bladder prior to the procedure to achieve safe access to the fluid collection.  Dr. Cathlean Sauer notified above recommendation.   The procedure is tentatively  scheduled for tomorrow pending IR schedule. NPO at midnight VSS No anticoag/plt INR pending Hgb dropped to 6.6 yesterday, pt received 1 PRBC. Hgb this morning 8.5, plt 209.  Will recheck labs tomorrow.   Risks and benefits discussed with the patient including bleeding, infection, damage to adjacent structures, bowel perforation/fistula connection, and sepsis.  All of the patient's questions were answered, patient is agreeable to proceed. Consent signed and in IR binder.    Thank you for this interesting consult.  I greatly enjoyed meeting [employer] and look forward to participating in their care.  A copy of this report was sent to the requesting provider on this date.  Electronically Signed: Tera Mater, PA-C 06/05/2021, 2:54 PM   I spent a total of    40 Minutes in face to face in clinical consultation, greater than 50% of which was counseling/coordinating care for aspiration and possible drain placement for an intraabodminla fluid collection

## 2021-06-05 NOTE — Progress Notes (Signed)
The patient is refusing her scheduled Protonix oral . Stated she's going to do surgery and that she should not take anything by mouth. She's rejecting the education provided by this RN .The patient is  very upset screaming/yelling and calling this RN by names. Called her daughter Alphonzo Lemmings to see if she  could calm her down. Whitney is at the bedside calming patient's emotions. Patient finally took her medicine with the help of her daughter. Will continue to monitor.

## 2021-06-05 NOTE — Progress Notes (Signed)
4 Days Post-Op  Subjective: CC: Denies any complaints. Tolerating clear liquids at this point without nausea nor emesis. Denies distention or bloating. 2 of her daughters are present at bedside this morning   Objective: Vital signs in last 24 hours: Temp:  [97.6 F (36.4 C)-98.2 F (36.8 C)] 97.7 F (36.5 C) (06/20 0900) Pulse Rate:  [64-77] 64 (06/20 0645) Resp:  [11-16] 16 (06/20 0645) BP: (106-156)/(69-94) 113/69 (06/20 0900) SpO2:  [93 %-98 %] 97 % (06/20 0645) Last BM Date: 06/05/21  Intake/Output from previous day: 06/19 0701 - 06/20 0700 In: 2265.5 [P.O.:840; I.V.:876.5; Blood:345; IV Piggyback:204] Out: 1700 [Urine:1700] Intake/Output this shift: Total I/O In: 240 [P.O.:240] Out: -   PE: Gen:  Alert, NAD, pleasant; up in chair Pulm: Normal rate and effort, on o2 Abd: Soft, nontender throughout; not significantly distended per se. No rebound nor guarding GU: clear yellow urine in cannister on wall Skin: no rashes noted, warm and dry   Lab Results:  Recent Labs    06/03/21 1226 06/03/21 2013 06/04/21 0400 06/05/21 0512  WBC 15.5*  --   --  14.8*  HGB 7.3*   < > 6.6* 8.5*  HCT 22.4*   < > 20.7* 26.1*  PLT 176  --   --  209   < > = values in this interval not displayed.   BMET Recent Labs    06/04/21 0400 06/05/21 0500  NA 136 136  K 3.8 4.0  CL 102 99  CO2 29 30  GLUCOSE 123* 143*  BUN 10 9  CREATININE 0.73 0.66  CALCIUM 8.4* 8.5*   PT/INR No results for input(s): LABPROT, INR in the last 72 hours. CMP     Component Value Date/Time   NA 136 06/05/2021 0500   NA 141 11/07/2020 1058   K 4.0 06/05/2021 0500   CL 99 06/05/2021 0500   CO2 30 06/05/2021 0500   GLUCOSE 143 (H) 06/05/2021 0500   BUN 9 06/05/2021 0500   BUN 13 11/07/2020 1058   CREATININE 0.66 06/05/2021 0500   CALCIUM 8.5 (L) 06/05/2021 0500   PROT 4.4 (L) 06/05/2021 0500   PROT 6.5 11/07/2020 1058   ALBUMIN 2.0 (L) 06/05/2021 0500   ALBUMIN 4.3 11/07/2020 1058   AST  24 06/05/2021 0500   ALT 25 06/05/2021 0500   ALKPHOS 93 06/05/2021 0500   BILITOT 0.5 06/05/2021 0500   BILITOT 0.6 11/07/2020 1058   GFRNONAA >60 06/05/2021 0500   GFRAA 61 11/07/2020 1058   Lipase     Component Value Date/Time   LIPASE 19 05/26/2021 1601       Studies/Results: CT ABDOMEN PELVIS W CONTRAST  Result Date: 06/03/2021 CLINICAL DATA:  History of diverticulitis, follow-up exam EXAM: CT ABDOMEN AND PELVIS WITH CONTRAST TECHNIQUE: Multidetector CT imaging of the abdomen and pelvis was performed using the standard protocol following bolus administration of intravenous contrast. CONTRAST:  OMNIPAQUE IOHEXOL 300 MG/ML  SOLN COMPARISON:  05/26/2021 FINDINGS: Lower chest: Lung bases demonstrate some minimal atelectatic changes on the right. Previously seen pulmonary emboli are again identified right greater than left. Hepatobiliary: Fatty infiltration of the liver is noted. A small cyst is noted within the right lobe. Gallbladder is unremarkable. Pancreas: Unremarkable. No pancreatic ductal dilatation or surrounding inflammatory changes. Spleen: Normal in size without focal abnormality. Adrenals/Urinary Tract: Adrenal glands are unremarkable. Kidneys demonstrate a normal enhancement pattern bilaterally. No definitive renal calculi are seen. No obstructive changes are noted. Normal excretion is noted on  delayed images. The bladder is well distended. Stomach/Bowel: Rectal tube is noted within the rectum. The sigmoid colon again demonstrates diverticulosis although the degree of inflammatory change has improved in the interval from the prior exam. The more proximal colon is unremarkable. Appendix is well visualized and within normal limits. Extraluminal air is noted surrounding the colon in the pelvis slightly increased in the interval from the prior exam. Additionally a large air-fluid collection is again identified interposed between the small bowel loops and uterus adjacent to the  sigmoid colon. This now measures 10.5 x 6.8 cm in greatest transverse and AP dimensions and extends for at least 6.7 cm in craniocaudad projection. This is again consistent with a large abscess. The abscess is again draped with multiple loops of small bowel precluding percutaneous drainage. The surrounding small bowel loops again demonstrates some mild inflammatory thickening. More proximal small bowel is within normal limits. No obstructive changes are seen. The stomach is unremarkable. Vascular/Lymphatic: Aortic atherosclerosis. No enlarged abdominal or pelvic lymph nodes. IVC filter is now seen in satisfactory position. Reproductive: Uterus and bilateral adnexa are unremarkable. Other: No free fluid is noted within the pelvis. No herniation is seen. Musculoskeletal: Degenerative changes of lumbar spine are noted. No acute bony abnormality is seen. IMPRESSION: Persistent and increasing abscess within the lower abdomen/pelvis interposed between multiple loops of inflamed small bowel in the uterus. The abscess is again draped with multiple loops of inflamed small bowel precluding percutaneous drainage. Persistent bilateral pulmonary emboli right greater than left. Fatty liver. Previously seen diverticulitis inflammatory change has improved in the interval from the prior study. Electronically Signed   By: Alcide Clever M.D.   On: 06/03/2021 15:21    Anti-infectives: Anti-infectives (From admission, onward)    Start     Dose/Rate Route Frequency Ordered Stop   05/27/21 0500  piperacillin-tazobactam (ZOSYN) IVPB 3.375 g        3.375 g 12.5 mL/hr over 240 Minutes Intravenous Every 8 hours 05/26/21 2126     05/26/21 2015  ceFEPIme (MAXIPIME) 2 g in sodium chloride 0.9 % 100 mL IVPB       See Hyperspace for full Linked Orders Report.   2 g 200 mL/hr over 30 Minutes Intravenous  Once 05/26/21 2004 05/26/21 2107   05/26/21 2015  metroNIDAZOLE (FLAGYL) IVPB 500 mg       See Hyperspace for full Linked Orders  Report.   500 mg 100 mL/hr over 60 Minutes Intravenous  Once 05/26/21 2004 05/26/21 2135        Assessment/Plan  59F Diverticulitis with intra-abdominal abscess (9x5 cm) - afebrile, WBC downtrending; is on chronic steroids as well with wean attempts underway - IR without a safe window for abscess drainage 6/12 - continue IV abx - No emergent surgical needs at this juncture - OOB/mobilize with therapies  - We spent time reviewing options going forward; she is clinically improving currently with IV abx; would be reasonable to see how things go with current treatment plan. We also reviewed surgery which would involve Hartmann's type procedure, potential for small bowel resection given CT findings. We reviewed what a colostomy would entail. She is on chronic steroids and would have increased risks of perioperative morbidity including wound complications, infection, dehiscence/evisceration, hernias, pulmonary related complications, worsening of pre-existing medical problems including progression of her kidney related dysfunction which could even result in dialysis.   - Would engage infectious disease for long-term abx plans    FEN: on clears and tpn. Continue clears today  ID: Zosyn VTE: SCD's, hep gtt was on hold for anemia. From our perspective resumption will be up to primary service Foley: none, external cath Dispo: SDU   - Below per primary service - Bilateral PE-heparin IV held due to hgb requiring transfusion. Hgb 7.2 this am from 8.0. S/p IVCF 6/16 by IR Melena - EGD 6/16 normal esophagus, erythematous mucosa in the antrum, duodenitis but no active bleeding. GI signed off. Acute respiratory failure with hypoxia Thombocytopenia Chronic diastolic heart failure CKD IIIa HTN Graves dz DM2 Morbid obesity    LOS: 10 days   Marin Olp, MD [facility] Use [email or web address] to contact on call provider

## 2021-06-05 NOTE — Progress Notes (Signed)
PROGRESS NOTE    [name]  JOA:[id number] DOB: 23-Jun-1951 DOA: 05/26/2021 PCP: Ollen Bowl, MD    Brief Narrative:  [name] was admitted to the hospital with the working diagnosis of perforated diverticulitis complicated with intra-abdominal abscess, pulmonary embolism and acute blood loss anemia (upper GI bleed).     70 year old female past medical history for diastolic heart failure, hypertension, type 2 diabetes mellitus, and obstructive sleep apnea who presented with abdominal pain, nausea, vomiting and diarrhea for 2 weeks.  Patient was referred to the ED by her primary care provider.  On her initial physical examination she was hypotensive that improved with IV fluids to 110/82, heart rate 95, respiratory rate 22, oxygen saturation 96%, her lungs were clear to auscultation, heart S1-S2, present, rhythmic, soft tender to palpation, no rebound or guarding, no lower extremity edema.   Sodium 134, potassium 2.8, chloride 98, bicarb 24, glucose 287, BUN 23, creatinine 1.28, white count 8.2, hemoglobin 13.1, hematocrit 41.0, platelets clumped.   CT of the abdomen pelvis with perforated diverticulitis involving the mid sigmoid colon with a large 9.2x5.8 complex abscess anterior to the uterus and in between distal small bowel loops.  Bilateral central and lower lobe pulmonary emboli.   Chest radiograph no infiltrates.   EKG 99 bpm, left axis deviation, left anterior fascicular block, normal intervals, sinus rhythm with PACs, no significant ST segment or T wave changes.  Patient placed on broad spectrum antibiotic therapy and surgery/ ID were consulted. Patient placed on TPN for nutrition and anticoagulated for pulmonary embolism.   Positive GI bleeding that obligated discontinuation of anticoagulation. IVC filter was placed. Follow up abdomen and pelvis CT with worsening abscess.   Consulted IR for possible drainage.    Assessment & Plan:   Principal Problem:    Colonic diverticular abscess Active Problems:   Chronic diastolic CHF (congestive heart failure) (HCC)   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 (HCC)   Moderate persistent asthma, uncomplicated   Seizure (HCC)   Bilateral pulmonary embolism (HCC)     Acute diverticulitis, complicated with intra-abdominal abscess.  Patient is tolerating po well with no nausea or vomiting, no abdominal pain, dyspnea or chest pain. Wbc continue to be elevated at 14,8.    On antibiotic therapy with Zosyn #9.  IR has been re-consulted for possible abscess drainage specially considering worsening in size per last CT. Follow up with ID and surgery recommendations. For now will continue with TPN for nutritional support. Place foley catheter in preparation for possible IR procedure.   2. Acute bilateral pulmonary embolism/ acute hypoxemic respiratory failure. Sp IVC filter.  Patient required PRBC transfusion yesterday for Hgb at 6,6. Today with no signs of bleeding. Plan to wait for possible IR procedure before attempting to resume anticoagulation.    3. Acute blood loss anemia, due to GI bleeding.  Sp PRBC transfusion with Hgb up to 8.5 Continue to follow cell count in am.    4. Uncontrolled T2DM Hyper and hypoglycemia. Fasting glucose this am was 143,  Continue with insulin infusion per TPN and low dose basal at 6 units plus sliding scale.     5. CKD stage 3a  renal function continue to be stable with serum cr at 0,66 with K at 4,0 and bicarbonate at 30.    6. Heart failure with diastolic dysfunction No acute signs of decompensation   7. Hyperthyroidism. On levothyroxine and steroid taper.   Patient continue to be at high risk for worsening abdominal abscess  Status is: Inpatient  Remains inpatient appropriate because:IV treatments appropriate due to intensity of illness or inability to take PO  Dispo: The patient is from: Home              Anticipated d/c is to: SNF               Patient currently is not medically stable to d/c.   Difficult to place patient No   DVT prophylaxis: Scd   Code Status:   full  Family Communication: I spoke over the phone with the patient's daughtwers about patient's  condition, plan of care, prognosis and all questions were addressed.    Consultants:  Surgery  ID   Antimicrobials:  Zosyn    Subjective: Patient with no nausea or vomiting, no chest pain or abdominal pain, continue to be very weak and deconditioned  Objective: Vitals:   06/05/21 0336 06/05/21 0436 06/05/21 0645 06/05/21 0900  BP: (!) 134/94 128/75 121/71 113/69  Pulse: 73 73 64   Resp: 14 13 16    Temp:  98.2 F (36.8 C)  97.7 F (36.5 C)  TempSrc:  Oral  Axillary  SpO2: 98% 96% 97%   Weight:      Height:        Intake/Output Summary (Last 24 hours) at 06/05/2021 1537 Last data filed at 06/05/2021 1500 Gross per 24 hour  Intake 2062.47 ml  Output 1300 ml  Net 762.47 ml   Filed Weights   05/27/21 0041  Weight: 109.1 kg    Examination:   General: Not in pain or dyspnea  Neurology: Awake and alert, non focal  E ENT: mild pallor, no icterus, oral mucosa moist. Positive exophthalmus.  Cardiovascular: No JVD. S1-S2 present, rhythmic, no gallops, rubs, or murmurs. ++ bilateral lower extremity edema. Pulmonary: positive breath sounds bilaterally, adequate air movement, no wheezing, rhonchi or rales. Gastrointestinal. Abdomen protuberant, soft and non tender, no rebounf or guarding Skin. No rashes Musculoskeletal: no joint deformities     Data Reviewed: I have personally reviewed following labs and imaging studies  CBC: Recent Labs  Lab 06/01/21 0503 06/01/21 0912 06/02/21 0728 06/02/21 1750 06/03/21 0312 06/03/21 1211 06/03/21 1226 06/03/21 2013 06/04/21 0400 06/05/21 0512  WBC 12.9*  --  16.5* 17.9* 16.3*  --  15.5*  --   --  14.8*  NEUTROABS 9.8*  --   --   --   --   --  13.1*  --   --  10.6*  HGB 7.2*   < > 7.6* 8.0* 7.2* 7.1*  7.3* 7.2* 6.6* 8.5*  HCT 22.5*   < > 23.3* 24.6* 21.7* 22.1* 22.4* 22.1* 20.7* 26.1*  MCV 96.6  --  96.7 94.6 95.6  --  96.1  --   --  94.9  PLT 154  --  151 144* 157  --  176  --   --  209   < > = values in this interval not displayed.   Basic Metabolic Panel: Recent Labs  Lab 06/01/21 0503 06/02/21 0728 06/03/21 0845 06/04/21 0400 06/05/21 0500  NA 139 137 137 136 136  K 4.2 3.8 4.3 3.8 4.0  CL 107 106 105 102 99  CO2 25 21* 25 29 30   GLUCOSE 107* 272* 236* 123* 143*  BUN 30* 21 12 10 9   CREATININE 1.16* 1.05* 0.70 0.73 0.66  CALCIUM 8.3* 8.4* 8.7* 8.4* 8.5*  MG 1.5* 2.0 1.7 1.6* 1.9  PHOS 2.5 3.4 2.3* 2.3* 3.1   GFR:  Estimated Creatinine Clearance: 76.1 mL/min (by C-G formula based on SCr of 0.66 mg/dL). Liver Function Tests: Recent Labs  Lab 06/01/21 0503 06/03/21 0845 06/05/21 0500  AST 16 14* 24  ALT 19 19 25   ALKPHOS 99 94 93  BILITOT 0.7 0.6 0.5  PROT 4.1* 4.6* 4.4*  ALBUMIN 1.7* 1.9* 2.0*   No results for input(s): LIPASE, AMYLASE in the last 168 hours. No results for input(s): AMMONIA in the last 168 hours. Coagulation Profile: No results for input(s): INR, PROTIME in the last 168 hours. Cardiac Enzymes: No results for input(s): CKTOTAL, CKMB, CKMBINDEX, TROPONINI in the last 168 hours. BNP (last 3 results) No results for input(s): PROBNP in the last 8760 hours. HbA1C: No results for input(s): HGBA1C in the last 72 hours. CBG: Recent Labs  Lab 06/04/21 1946 06/04/21 2349 06/05/21 0456 06/05/21 0729 06/05/21 1148  GLUCAP 259* 144* 137* 140* 269*   Lipid Profile: Recent Labs    06/05/21 0500  TRIG 94   Thyroid Function Tests: No results for input(s): TSH, T4TOTAL, FREET4, T3FREE, THYROIDAB in the last 72 hours. Anemia Panel: No results for input(s): VITAMINB12, FOLATE, FERRITIN, TIBC, IRON, RETICCTPCT in the last 72 hours.    Radiology Studies: I have reviewed all of the imaging during this hospital visit personally     Scheduled  Meds:  Chlorhexidine Gluconate Cloth  6 each Topical Daily   feeding supplement  1 Container Oral TID BM   insulin aspart  0-20 Units Subcutaneous Q4H   insulin glargine  6 Units Subcutaneous Daily   levothyroxine  112 mcg Oral Once per day on Mon Tue Wed Thu Fri Sat   And   levothyroxine  56 mcg Oral Once per day on Sun   [START ON 06/06/2021] methylPREDNISolone (SOLU-MEDROL) injection  16 mg Intravenous Daily   Followed by   06/08/2021 ON 06/11/2021] methylPREDNISolone (SOLU-MEDROL) injection  8 mg Intravenous Daily   pantoprazole  40 mg Oral BID   sodium chloride flush  10-40 mL Intracatheter Q12H   Continuous Infusions:  piperacillin-tazobactam (ZOSYN)  IV 3.375 g (06/05/21 1327)   TPN ADULT (ION) 35 mL/hr at 06/05/21 0528   TPN ADULT (ION)       LOS: 10 days        [name] 06/07/21, MD

## 2021-06-05 NOTE — Progress Notes (Signed)
Inpatient Rehab Admissions Coordinator:   Met with patient and her two daughters at the bedside to discuss CIR goals/expectations.  We discussed goals of supervision level, with 24/7 recommended, and daughters confirm they are able to provide this level of care.  I let them know average length of stay on CIR is about 2 weeks, dependent upon progress while admitted.  I reviewed insurance authorization process and let them know that [place] [employer] would not approve SNF following CIR; the only option is to go home with family support.  Currently, pt is not medically ready for CIR admission, and we will follow for firm plan regarding treatment of her abscess, especially given its increasing size.    Shann Medal, PT, DPT Admissions Coordinator [phone] 06/05/21  1:51 PM

## 2021-06-06 ENCOUNTER — Inpatient Hospital Stay (HOSPITAL_COMMUNITY): Payer: Medicare HMO

## 2021-06-06 DIAGNOSIS — I5032 Chronic diastolic (congestive) heart failure: Secondary | ICD-10-CM | POA: Diagnosis not present

## 2021-06-06 DIAGNOSIS — E119 Type 2 diabetes mellitus without complications: Secondary | ICD-10-CM | POA: Diagnosis not present

## 2021-06-06 DIAGNOSIS — I2699 Other pulmonary embolism without acute cor pulmonale: Secondary | ICD-10-CM | POA: Diagnosis not present

## 2021-06-06 DIAGNOSIS — K5720 Diverticulitis of large intestine with perforation and abscess without bleeding: Secondary | ICD-10-CM | POA: Diagnosis not present

## 2021-06-06 LAB — COMPREHENSIVE METABOLIC PANEL
ALT: 28 U/L (ref 0–44)
AST: 24 U/L (ref 15–41)
Albumin: 2 g/dL — ABNORMAL LOW (ref 3.5–5.0)
Alkaline Phosphatase: 93 U/L (ref 38–126)
Anion gap: 12 (ref 5–15)
BUN: 10 mg/dL (ref 8–23)
CO2: 27 mmol/L (ref 22–32)
Calcium: 8.9 mg/dL (ref 8.9–10.3)
Chloride: 99 mmol/L (ref 98–111)
Creatinine, Ser: 0.65 mg/dL (ref 0.44–1.00)
GFR, Estimated: >60 mL/min (ref >60)
Glucose, Bld: 123 mg/dL — ABNORMAL HIGH (ref 70–99)
Potassium: 3.9 mmol/L (ref 3.5–5.1)
Sodium: 138 mmol/L (ref 135–145)
Total Bilirubin: 0.6 mg/dL (ref 0.3–1.2)
Total Protein: 4.5 g/dL — ABNORMAL LOW (ref 6.5–8.1)

## 2021-06-06 LAB — PHOSPHORUS: Phosphorus: 3.1 mg/dL (ref 2.5–4.6)

## 2021-06-06 LAB — GLUCOSE, CAPILLARY
Glucose-Capillary: 118 mg/dL — ABNORMAL HIGH (ref 70–99)
Glucose-Capillary: 170 mg/dL — ABNORMAL HIGH (ref 70–99)
Glucose-Capillary: 174 mg/dL — ABNORMAL HIGH (ref 70–99)
Glucose-Capillary: 212 mg/dL — ABNORMAL HIGH (ref 70–99)
Glucose-Capillary: 218 mg/dL — ABNORMAL HIGH (ref 70–99)
Glucose-Capillary: 279 mg/dL — ABNORMAL HIGH (ref 70–99)

## 2021-06-06 LAB — MAGNESIUM: Magnesium: 1.8 mg/dL (ref 1.7–2.4)

## 2021-06-06 LAB — PROTIME-INR
INR: 1.1 (ref 0.8–1.2)
Prothrombin Time: 14.2 seconds (ref 11.4–15.2)

## 2021-06-06 MED ORDER — FENTANYL CITRATE (PF) 100 MCG/2ML IJ SOLN
INTRAMUSCULAR | Status: AC | PRN
  Administered 2021-06-06 (×2): 50 ug via INTRAVENOUS

## 2021-06-06 MED ORDER — SODIUM CHLORIDE 0.9 % IV SOLN: INTRAVENOUS | Status: DC

## 2021-06-06 MED ORDER — ENSURE ENLIVE PO LIQD
237.0000 mL | Freq: Two times a day (BID) | ORAL | Status: DC
  Administered 2021-06-06 – 2021-06-18 (×14): 237 mL via ORAL

## 2021-06-06 MED ORDER — FENTANYL CITRATE (PF) 100 MCG/2ML IJ SOLN
INTRAMUSCULAR | Status: AC
  Filled 2021-06-06: qty 4

## 2021-06-06 MED ORDER — SODIUM CHLORIDE 0.9% FLUSH
5.0000 mL | Freq: Three times a day (TID) | INTRAVENOUS | Status: DC
  Administered 2021-06-06 – 2021-06-23 (×48): 5 mL

## 2021-06-06 MED ORDER — MIDAZOLAM HCL 2 MG/2ML IJ SOLN
INTRAMUSCULAR | Status: AC | PRN
  Administered 2021-06-06 (×2): 1 mg via INTRAVENOUS

## 2021-06-06 MED ORDER — MIDAZOLAM HCL 2 MG/2ML IJ SOLN
INTRAMUSCULAR | Status: AC
  Filled 2021-06-06: qty 4

## 2021-06-06 MED ORDER — LIDOCAINE HCL 1 % IJ SOLN
INTRAMUSCULAR | Status: AC
  Filled 2021-06-06: qty 10

## 2021-06-06 MED ORDER — TRAVASOL 10 % IV SOLN
INTRAVENOUS | Status: AC
  Filled 2021-06-06: qty 826.8

## 2021-06-06 NOTE — Progress Notes (Signed)
Patient has new drain in lower abdominal region to gravity. IR got 180 mL from abscess. Gerharts cream was order for pressure ulcers on bottom. Ensure was ordered but patient is really worried about sugar levels. She is on a full liquid diet but does not like choices available.

## 2021-06-06 NOTE — Progress Notes (Signed)
PROGRESS NOTE    [name]  XTK:[id number] DOB: Dec 24, 1950 DOA: 05/26/2021 PCP: Ollen Bowl, MD    Brief Narrative:  Mrs. Forman was admitted to the hospital with the working diagnosis of perforated diverticulitis complicated with intra-abdominal abscess, pulmonary embolism and acute blood loss anemia (upper GI bleed).     70 year old female past medical history for diastolic heart failure, hypertension, type 2 diabetes mellitus, and obstructive sleep apnea who presented with abdominal pain, nausea, vomiting and diarrhea for 2 weeks.  Patient was referred to the ED by her primary care provider.  On her initial physical examination she was hypotensive that improved with IV fluids to 110/82, heart rate 95, respiratory rate 22, oxygen saturation 96%, her lungs were clear to auscultation, heart S1-S2, present, rhythmic, soft tender to palpation, no rebound or guarding, no lower extremity edema.   Sodium 134, potassium 2.8, chloride 98, bicarb 24, glucose 287, BUN 23, creatinine 1.28, white count 8.2, hemoglobin 13.1, hematocrit 41.0, platelets clumped.   CT of the abdomen pelvis with perforated diverticulitis involving the mid sigmoid colon with a large 9.2x5.8 complex abscess anterior to the uterus and in between distal small bowel loops.  Bilateral central and lower lobe pulmonary emboli.   Chest radiograph no infiltrates.   EKG 99 bpm, left axis deviation, left anterior fascicular block, normal intervals, sinus rhythm with PACs, no significant ST segment or T wave changes.   Patient placed on broad spectrum antibiotic therapy and surgery/ ID were consulted. Patient placed on TPN for nutrition and anticoagulated for pulmonary embolism.   Positive GI bleeding that obligated discontinuation of anticoagulation. IVC filter was placed. Follow up abdomen and pelvis CT with worsening abscess.   06/21: 10 Fr drainage catheter placed into the diverticular abscess per IR    Assessment  & Plan:   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 (HCC)   Moderate persistent asthma, uncomplicated   Seizure (HCC)   Bilateral pulmonary embolism (HCC)       Acute diverticulitis, complicated with intra-abdominal abscess. (no sepsis)  Today underwent drain placement to abscess with good toleration. She is tolerating po well, with no nausea or vomiting, no significant abdominal pain.    Plant to continue antibiotic therapy with Zosyn # 10  Follow up with cultures from drainage. IF continue to tolerate po diet well, will plan to start wean off TPN tomorrow.    2. Acute bilateral pulmonary embolism/ acute hypoxemic respiratory failure. Sp IVC filter. sp PRBC transfusion   Follow with cell count in am, if stable will attempt to resume anticoagulation.    3. Acute blood loss anemia, due to GI bleeding.  Sp PRBC 1 unit PRBC transfusion   Follow cell count in am.    4. Uncontrolled T2DM Hyper and hypoglycemia. Fasting glucose this am was 143, Fasting glucose is 123 this am , continue insulin coverage per TPN per protocol. Will hold on long acting insulin for now.  Patient is tolerating soft diet well.     5. CKD stage 3a  her renal function continue to be stable with serum cr at 0,65 with K at 3,9 and serum bicarbonate at 27. Follow up renal function in am.     6. Heart failure with diastolic dysfunction No signs of acute  decompensation   7. Hyperthyroidism. Continue with levothyroxine and steroid taper.   Patient continue to be at  high risk for worsening abdominal abscess   Status is: Inpatient  Remains inpatient appropriate because:Inpatient level of care appropriate due to severity of illness  Dispo: The patient is from: Home              Anticipated d/c is to: CIR              Patient currently is not medically stable to d/c.    Difficult to place patient No   DVT prophylaxis: IVC filter   Code Status:   full  Family Communication:  No family at the bedside      Consultants:  Surgery  ID IR   Procedures:  Abscess drain placement 06/21  Antimicrobials:  Zosyn     Subjective: Patient with no nausea or vomiting, no dyspnea or chest pain, no significant abdominal pain.   Objective: Vitals:   06/06/21 1025 06/06/21 1030 06/06/21 1035 06/06/21 1059  BP: 106/87 120/82 121/78 124/83  Pulse: 71 70 74 65  Resp: 12 12 12 20   Temp:    97.7 F (36.5 C)  TempSrc:      SpO2: 99% 97% 97% 97%  Weight:      Height:        Intake/Output Summary (Last 24 hours) at 06/06/2021 1537 Last data filed at 06/06/2021 1100 Gross per 24 hour  Intake 1225.33 ml  Output 3780 ml  Net -2554.67 ml   Filed Weights   05/27/21 0041  Weight: 109.1 kg    Examination:   General: Not in pain or dyspnea, deconditioned  Neurology: Awake and alert, non focal  E ENT: mild pallor, no icterus, oral mucosa moist Cardiovascular: No JVD. S1-S2 present, rhythmic, no gallops, rubs, or murmurs. No lower extremity edema. Pulmonary: positive breath sounds bilaterally, with no wheezing, rhonchi or rales. Gastrointestinal. Abdomen protuberant, drain in place Skin. No rashes Musculoskeletal: no joint deformities     Data Reviewed: I have personally reviewed following labs and imaging studies  CBC: Recent Labs  Lab 06/01/21 0503 06/01/21 0912 06/02/21 0728 06/02/21 1750 06/03/21 0312 06/03/21 1211 06/03/21 1226 06/03/21 2013 06/04/21 0400 06/05/21 0512  WBC 12.9*  --  16.5* 17.9* 16.3*  --  15.5*  --   --  14.8*  NEUTROABS 9.8*  --   --   --   --   --  13.1*  --   --  10.6*  HGB 7.2*   < > 7.6* 8.0* 7.2* 7.1* 7.3* 7.2* 6.6* 8.5*  HCT 22.5*   < > 23.3* 24.6* 21.7* 22.1* 22.4* 22.1* 20.7* 26.1*  MCV 96.6  --  96.7 94.6 95.6  --  96.1  --   --  94.9  PLT 154  --  151 144* 157  --  176  --   --  209   < > = values in this  interval not displayed.   Basic Metabolic Panel: Recent Labs  Lab 06/02/21 0728 06/03/21 0845 06/04/21 0400 06/05/21 0500 06/06/21 0230  NA 137 137 136 136 138  K 3.8 4.3 3.8 4.0 3.9  CL 106 105 102 99 99  CO2 21* 25 29 30 27   GLUCOSE 272* 236* 123* 143* 123*  BUN 21 12 10 9 10   CREATININE 1.05* 0.70 0.73 0.66 0.65  CALCIUM 8.4* 8.7* 8.4* 8.5* 8.9  MG 2.0 1.7 1.6* 1.9 1.8  PHOS 3.4 2.3* 2.3* 3.1 3.1   GFR: Estimated Creatinine Clearance: 76.1 mL/min (by C-G formula based on SCr of 0.65 mg/dL). Liver Function  Tests: Recent Labs  Lab 06/01/21 0503 06/03/21 0845 06/05/21 0500 06/06/21 0230  AST 16 14* 24 24  ALT 19 19 25 28   ALKPHOS 99 94 93 93  BILITOT 0.7 0.6 0.5 0.6  PROT 4.1* 4.6* 4.4* 4.5*  ALBUMIN 1.7* 1.9* 2.0* 2.0*   No results for input(s): LIPASE, AMYLASE in the last 168 hours. No results for input(s): AMMONIA in the last 168 hours. Coagulation Profile: Recent Labs  Lab 06/06/21 0230  INR 1.1   Cardiac Enzymes: No results for input(s): CKTOTAL, CKMB, CKMBINDEX, TROPONINI in the last 168 hours. BNP (last 3 results) No results for input(s): PROBNP in the last 8760 hours. HbA1C: No results for input(s): HGBA1C in the last 72 hours. CBG: Recent Labs  Lab 06/05/21 2331 06/06/21 0427 06/06/21 0741 06/06/21 1103 06/06/21 1511  GLUCAP 138* 118* 170* 174* 218*   Lipid Profile: Recent Labs    06/05/21 0500  TRIG 94   Thyroid Function Tests: No results for input(s): TSH, T4TOTAL, FREET4, T3FREE, THYROIDAB in the last 72 hours. Anemia Panel: No results for input(s): VITAMINB12, FOLATE, FERRITIN, TIBC, IRON, RETICCTPCT in the last 72 hours.    Radiology Studies: I have reviewed all of the imaging during this hospital visit personally     Scheduled Meds:  Chlorhexidine Gluconate Cloth  6 each Topical Daily   feeding supplement  237 mL Oral BID BM   fentaNYL       insulin aspart  0-20 Units Subcutaneous Q4H   levothyroxine  112 mcg Oral  Once per day on Mon Tue Wed Thu Fri Sat   And   levothyroxine  56 mcg Oral Once per day on Sun   lidocaine       methylPREDNISolone (SOLU-MEDROL) injection  16 mg Intravenous Daily   Followed by   Wed ON 06/11/2021] methylPREDNISolone (SOLU-MEDROL) injection  8 mg Intravenous Daily   midazolam       pantoprazole  40 mg Oral BID   sodium chloride flush  10-40 mL Intracatheter Q12H   sodium chloride flush  5 mL Intracatheter Q8H   Continuous Infusions:  sodium chloride     sodium chloride     piperacillin-tazobactam (ZOSYN)  IV 3.375 g (06/06/21 1216)   TPN ADULT (ION) 45 mL/hr at 06/05/21 1728   TPN ADULT (ION)       LOS: 11 days        Koya Hunger 06/07/21, MD

## 2021-06-06 NOTE — Progress Notes (Signed)
OT Cancellation Note  Patient Details Name: [name] MRN: [id number] DOB: 04-May-1951   Cancelled Treatment:    Reason Eval/Treat Not Completed: Patient declined, no reason specified (Pt resting after being repositioned. Had procedure today and family member in room requests pt to rest. OT following to continue OT intervention.)  [name], OTR/L Acute Rehabilitation Services Pager: [phone] Office: [phone]   [name] 06/06/2021, 2:25 PM

## 2021-06-06 NOTE — Progress Notes (Signed)
Inpatient Rehab Admissions Coordinator:   Spoke to acute care providers to determine timing of starting insurance auth.  Should be able to start in about 48 hours.    Estill Dooms, PT, DPT Admissions Coordinator [phone] 06/06/21  3:33 PM

## 2021-06-06 NOTE — Progress Notes (Signed)
[facility]  Date of Admission:  05/26/2021      Total days of antibiotics 11   Zosyn 6/11 >> current          ASSESSMENT: [name] is a 70 y.o. female with intraabdominal abscess following perforated diverticula. Increased in size on recent CT scan now s/p percutaneous drain placement. 180cc purulent drainage removed and specimens sent to micro and pending any data. Continue zosyn for now.   Hopeful she can be cleared to resume POs and progress diet to shorten TNP duration. Would like for her to be off as soon as she can to avoid further infections a/w this.   Plan for discharge to CIR once medically ready.    PLAN: Continue zosyn  Follow micro collected from drainage  Diet progression per primary team / surgery - would like to have her off as soon as she can support herself    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 (HCC)   Moderate persistent asthma, uncomplicated   Seizure (HCC)   Bilateral pulmonary embolism (HCC)    Chlorhexidine Gluconate Cloth  6 each Topical Daily   feeding supplement  1 Container Oral TID BM   fentaNYL       insulin aspart  0-20 Units Subcutaneous Q4H   levothyroxine  112 mcg Oral Once per day on Mon Tue Wed Thu Fri Sat   And   levothyroxine  56 mcg Oral Once per day on Sun   lidocaine       methylPREDNISolone (SOLU-MEDROL) injection  16 mg Intravenous Daily   Followed by   Melene Muller ON 06/11/2021] methylPREDNISolone (SOLU-MEDROL) injection  8 mg Intravenous Daily   midazolam       pantoprazole  40 mg Oral BID   sodium chloride flush  10-40 mL Intracatheter Q12H   sodium chloride flush  5 mL Intracatheter Q8H    SUBJECTIVE: Wondering when she can eat. She feels hungry and ready to try. Her drain was just placed for abscess and happy she had a lot of stuff  come out.   Her daughters are present and all agree they are glad this was placed and hopes she can avoid surgery and cure the infection this way.    Review of Systems: Review of Systems  Constitutional:  Negative for chills, fever and malaise/fatigue.  HENT:  Negative for sore throat.   Respiratory:  Negative for cough.   Cardiovascular:  Negative for chest pain.  Gastrointestinal:  Negative for abdominal pain, diarrhea, nausea and vomiting.  Genitourinary:  Negative for dysuria.  Skin:  Negative for rash.  Neurological:  Negative for dizziness.     Allergies  Allergen Reactions   Gluten Meal     Celiac    OBJECTIVE: Vitals:   06/06/21 1025 06/06/21 1030 06/06/21 1035 06/06/21 1059  BP: 106/87 120/82 121/78 124/83  Pulse: 71 70 74 65  Resp: 12 12 12 20   Temp:    97.7 F (36.5 C)  TempSrc:      SpO2: 99% 97% 97% 97%  Weight:      Height:       Body mass index is 43.99 kg/m.  Physical Exam Constitutional:      Appearance: She is well-developed.     Comments: Resting comfortably in bed with family present.   Cardiovascular:  Rate and Rhythm: Normal rate and regular rhythm.  Pulmonary:     Effort: Pulmonary effort is normal.     Breath sounds: Normal breath sounds.  Abdominal:     General: There is no distension.     Palpations: Abdomen is soft.     Comments: JP drain in place with tan material in bulb   Skin:    General: Skin is warm and dry.     Capillary Refill: Capillary refill takes less than 2 seconds.  Neurological:     Mental Status: She is alert and oriented to person, place, and time.    Lab Results Lab Results  Component Value Date   WBC 14.8 (H) 06/05/2021   HGB 8.5 (L) 06/05/2021   HCT 26.1 (L) 06/05/2021   MCV 94.9 06/05/2021   PLT 209 06/05/2021    Lab Results  Component Value Date   CREATININE 0.65 06/06/2021   BUN 10 06/06/2021   NA 138 06/06/2021   K 3.9 06/06/2021   CL 99 06/06/2021   CO2 27 06/06/2021    Lab Results   Component Value Date   ALT 28 06/06/2021   AST 24 06/06/2021   ALKPHOS 93 06/06/2021   BILITOT 0.6 06/06/2021     Microbiology: Recent Results (from the past 240 hour(s))  C Difficile Quick Screen w PCR reflex     Status: None   Collection Time: 05/30/21  4:57 PM   Specimen: Per Rectum; Stool  Result Value Ref Range Status   C Diff antigen NEGATIVE NEGATIVE Final   C Diff toxin NEGATIVE NEGATIVE Final   C Diff interpretation No C. difficile detected.  Final    Comment: Performed at [facility], [address] [place], [place] [address]    Rexene Alberts, MSN, NP-C [facility] Cell: [phone] Pager: [phone]  06/06/2021  12:27 PM

## 2021-06-06 NOTE — Progress Notes (Signed)
Progress Note  5 Days Post-Op  Subjective: CC: feeling well following IR drain placement this morning. Tolerated clears well yesterday without nausea/emesis or abdominal pain. She got OOB to chair yesterday. No SHOB on supplemental O2. Having bowel function   Objective: Vital signs in last 24 hours: Temp:  [97.7 F (36.5 C)-98.7 F (37.1 C)] 97.7 F (36.5 C) (06/21 1059) Pulse Rate:  [59-74] 65 (06/21 1059) Resp:  [11-20] 20 (06/21 1059) BP: (106-135)/(74-87) 124/83 (06/21 1059) SpO2:  [93 %-100 %] 97 % (06/21 1059) Last BM Date: 06/05/21  Intake/Output from previous day: 06/20 0701 - 06/21 0700 In: 1624 [P.O.:480; I.V.:982.1; IV Piggyback:162] Out: 1850 [Urine:1850] Intake/Output this shift: Total I/O In: -  Out: 1030 [Urine:850; Drains:180]  PE: General: pleasant, WD, female who is laying in bed in NAD HEENT: head is normocephalic, atraumatic.  Mouth is pink and moist Heart: regular, rate, and rhythm. Palpable radial pulses bilaterally Lungs: CTAB, no wheezes, rhonchi, or rales noted.  Respiratory effort nonlabored on supplemental O2 via [place] Abd: soft, ND, +BS, no TTP. Lower abdominal drain with blood tinged straw colored fluid MS: all 4 extremities are symmetrical with no cyanosis, clubbing, or edema. No calf TTP Skin: warm and dry with no masses, lesions, or rashes Psych: A&Ox3 with an appropriate affect.    Lab Results:  Recent Labs    06/03/21 1226 06/03/21 2013 06/04/21 0400 06/05/21 0512  WBC 15.5*  --   --  14.8*  HGB 7.3*   < > 6.6* 8.5*  HCT 22.4*   < > 20.7* 26.1*  PLT 176  --   --  209   < > = values in this interval not displayed.   BMET Recent Labs    06/05/21 0500 06/06/21 0230  NA 136 138  K 4.0 3.9  CL 99 99  CO2 30 27  GLUCOSE 143* 123*  BUN 9 10  CREATININE 0.66 0.65  CALCIUM 8.5* 8.9   PT/INR Recent Labs    06/06/21 0230  LABPROT 14.2  INR 1.1   CMP     Component Value Date/Time   NA 138 06/06/2021 0230   NA 141  11/07/2020 1058   K 3.9 06/06/2021 0230   CL 99 06/06/2021 0230   CO2 27 06/06/2021 0230   GLUCOSE 123 (H) 06/06/2021 0230   BUN 10 06/06/2021 0230   BUN 13 11/07/2020 1058   CREATININE 0.65 06/06/2021 0230   CALCIUM 8.9 06/06/2021 0230   PROT 4.5 (L) 06/06/2021 0230   PROT 6.5 11/07/2020 1058   ALBUMIN 2.0 (L) 06/06/2021 0230   ALBUMIN 4.3 11/07/2020 1058   AST 24 06/06/2021 0230   ALT 28 06/06/2021 0230   ALKPHOS 93 06/06/2021 0230   BILITOT 0.6 06/06/2021 0230   BILITOT 0.6 11/07/2020 1058   GFRNONAA >60 06/06/2021 0230   GFRAA 61 11/07/2020 1058   Lipase     Component Value Date/Time   LIPASE 19 05/26/2021 1601       Studies/Results: No results found.  Anti-infectives: Anti-infectives (From admission, onward)    Start     Dose/Rate Route Frequency Ordered Stop   05/27/21 0500  piperacillin-tazobactam (ZOSYN) IVPB 3.375 g        3.375 g 12.5 mL/hr over 240 Minutes Intravenous Every 8 hours 05/26/21 2126     05/26/21 2015  ceFEPIme (MAXIPIME) 2 g in sodium chloride 0.9 % 100 mL IVPB       See Hyperspace for full Linked Orders Report.  2 g 200 mL/hr over 30 Minutes Intravenous  Once 05/26/21 2004 05/26/21 2107   05/26/21 2015  metroNIDAZOLE (FLAGYL) IVPB 500 mg       See Hyperspace for full Linked Orders Report.   500 mg 100 mL/hr over 60 Minutes Intravenous  Once 05/26/21 2004 05/26/21 2135        Assessment/Plan 10F Diverticulitis with intra-abdominal abscess (9x5 cm) - afebrile, WBC downtrending; is on chronic steroids as well with wean attempts underway. Recheck CBC tomorrow - No emergent surgical needs at this juncture - OOB/mobilize with therapies  - Dr. Cliffton Asters had extensive discussion with her yesterday regarding treatment options including surgery and related risks because of her underlying chronic steroid needs.  - Continue drain and IV abx - ID is following   FEN: on clears and tpn. Continue clears today ID: Zosyn VTE: SCD's, hep gtt was on  hold for anemia. From our perspective resumption will be up to primary service Foley: none, external cath Dispo: SDU   - Below per primary service - Bilateral PE-heparin IV held due to hgb requiring transfusion. Hgb 7.2 this am from 8.0. S/p IVCF 6/16 by IR Melena - EGD 6/16 normal esophagus, erythematous mucosa in the antrum, duodenitis but no active bleeding. GI signed off. Acute respiratory failure with hypoxia Thombocytopenia Chronic diastolic heart failure CKD IIIa HTN Graves dz DM2 Morbid obesity     LOS: 11 days    Eric Form, [facility] 06/06/2021, 11:53 AM Please see Amion for pager number during day hours 7:00am-4:30pm

## 2021-06-06 NOTE — Progress Notes (Signed)
Nutrition Follow-up  DOCUMENTATION CODES:   Obesity unspecified  INTERVENTION:   - If PO intake is inadequate on full liquid diet, recommend discontinuing TPN and starting tube feeds as pt is having bowel function  - Hx of Celiac Disease; pt will require gluten-free diet once diet advanced  - Ensure Enlive po TID, each supplement provides 350 kcal and 20 grams of protein  - Encourage PO intake  - TPN dosing per Pharmacy, recommend weaning TPN once pt demonstrates tolerance of full liquid diet  NUTRITION DIAGNOSIS:   Inadequate oral intake related to altered GI function, acute illness as evidenced by NPO status.  Ongoing, being addressed via diet advancement and oral nutrition supplements  GOAL:   Patient will meet greater than or equal to 90% of their needs  Progressing  MONITOR:   Diet advancement, PO intake, Weight trends, I & O's, Supplement acceptance  REASON FOR ASSESSMENT:   Consult New TPN/TNA  ASSESSMENT:   70 year old female admitted with abdominal pain, N/V/D x 2 weeks with perforated sigmoid diverticulitis with abscess, also with PE. PMH includes CHF, HTN, DM, celiac disease.  6/12 - clear liquids 6/13 - full liquids 6/15 - NPO 6/16 - s/p EGD without evidence of active bleeding, s/p IVC filter placement by IR, PICC placed, TPN started @ 35 ml/hr 6/17 - clear liquids 6/20 - TPN advanced to 45 ml/hr 6/21 - NPO, s/p IR aspiration of intra-abdominal abscess and drain placement, clear liquids  Pt was NPO for IR procedure today. Pt was on clear liquids after procedure and now on full liquid diet as of 1400.  Spoke with pt and two daughters at bedside. Will order oral nutrition supplements to aid pt in meeting kcal and protein needs. Encouraged pt to consume oral nutrition supplements but pt is concerned about blood sugars. RD assured pt that insulin doses would be adjusted as appropriate and that pt should still try to consume supplements.  If PO intake is  inadequate on full liquid diet, recommend discontinuing TPN and starting tube feeds as pt is having bowel function.  Discussed pt with Pharmacy this AM. Per Pharmacy note, plan is to increase TPN to 65 ml/hr at 1800 today. Pt's goal rate remains 90 ml/hr. TPN started 6/16 and pt has yet to reach goal rate.  No new weights since admission. Pt with moderate pitting edema to BLE.  Meal Completion: 50% x 2 clear liquid meals on 6/20, 100% x 1 clear liquid meal on 6/21  Medications reviewed and include: Boost Breeze TID, SSI q 4 hours, lantus 6 units daily, levothyroxine, IV solu-medrol, protonix, IV abx, TPN  Labs reviewed: hemoglobin 8.5 CBG's: 118-313 x 24 hours  UOP: 1850 ml x 24 hours I/O's: +3.8 L since admit  Diet Order:   Diet Order             Diet full liquid Room service appropriate? Yes; Fluid consistency: Thin  Diet effective 1400                   EDUCATION NEEDS:   Not appropriate for education at this time  Skin:  Skin Assessment: Skin Integrity Issues: Other: MASD to perineal region  Last BM:  06/05/21 type 7 via rectal tube  Height:   Ht Readings from Last 1 Encounters:  05/27/21 5\' 2"  (1.575 m)    Weight:   Wt Readings from Last 1 Encounters:  05/27/21 109.1 kg    BMI:  Body mass index is 43.99 kg/m.  Estimated  Nutritional Needs:   Kcal:  2000-2200 kcals  Protein:  110-120 g  Fluid:  >/= 2 L    Mertie Clause, MS, RD, LDN Inpatient Clinical Dietitian Please see AMiON for contact information.

## 2021-06-06 NOTE — Progress Notes (Addendum)
PHARMACY - TOTAL PARENTERAL NUTRITION CONSULT NOTE   Indication:  Intolerance to enteral feeding , diverticulitis w/ intra-abdominal abscess  Patient Measurements: Height: 5\' 2"  (157.5 cm) Weight: 109.1 kg (240 lb 8.4 oz) IBW/kg (Calculated) : 50.1 TPN AdjBW (KG): 64.9 Body mass index is 43.99 kg/m.  Assessment: 70 years of age female with known celiac disease, CHF, HTN, and DM admitted on 05/26/21 with perforated sigmoid diverticulitis and abscess as well as pulmonary embolism. IR unable to find a safe window for abscess drain placement and General Surgery recommending no surgery at this time. Attempted full liquids but had increased abdominal pain and loose stools so backed back down to clear liquids. Currently having bowel movements. Day #6 of hospital stay without adequate intake and patient has had N/V/D for 2 weeks prior to admission.   Daughter reports abdominal pain starting around 2 weeks ago which led to decreased oral intake which has gotten progressively worse leading up to admission. Since abdominal pain started, maybe would eat around 2 times per day.  Daughter reports patient would eat healthy (vegetables, fish, eggs) prior to abdominal pain. Drinks Glucerna shakes throughout the day.  Reports ~30 pound weight loss over the past 6 months. Patient is at risk for refeeding.   Has been tolerating CLD without nausea or emesis - now NPO for image guided aspiration and drain placement for intra-abdominal/pelvic fluid collection 6/21.  Glucose / Insulin: hx DM, CBGs 118-313. Lantus 6 units daily, regular insulin 10 units added to TPN on 6/20.  rSSI 43 units used. IV steroid taper beginning today.   Electrolytes: Na 138, K 3.9, Mg 1.8, Phos 3.1, CoCa 10.5 Renal: SCr <1, BUN 10 Hepatic: LFTs/Tbili wnl, TG 108, albumin 2, prealbumin 24.2 Intake / Output; MIVF: UOP 0.8 ml/kg/hr, Last BM 6/19, no MIVF GI Imaging:  6/10 CT A/P: perforated diverticulitis w/ large abscess  6/18 CT A/P:  persistent and increasing abscess within lower abd/pelvis interposed between multiple loops of inflamed small bowel precluding perc drain GI Surgeries / Procedures:  6/16 EGD: normal esophagus, erythematous mucosa, duodenitis, healed ulcer  Central access: Double lumen PICC 6/16 TPN start date: 6/16  Nutritional Goals (per RD recommendation on 05/29/21): kCal: 2000-2200, Protein: 110-120, Fluid: >= 2L Goal TPN 90 ml/hr will provide 115g AA, 63g ILE, and 297g CHO for a total of 2100 kcal/day  Current Nutrition:  NPO and TPN (NPO for procedure 6/21)   Plan:  Increase TPN to 24mL/hr at 1800 Electrolytes in TPN: Na 44mEq/L, K 62mEq/L, Ca 22mEq/L, Mg 10 mEq/L, and Phos 63mmol/L. Cl:Ac 1:1 Add standard MVI and trace elements to TPN Increase regular insulin to 40 units in TPN -patient received ~52 units insulin yesterday on TPN at 16ml/hr.  Increasing insulin to match SSI coverage requirements.  Continue Resistant SSI q4h  Discontinue lantus 6 units outside TPN bag Monitor TPN labs on Mon/Thurs F/u BMET, Mg, Phos 6/22  Thank you for involving pharmacy in this patient's care.  7/22, PharmD PGY-1 Acute Care Pharmacy Resident 06/06/2021 7:50 AM  **Pharmacist phone directory can be found on [email or web address] listed under [facility]**

## 2021-06-06 NOTE — Procedures (Signed)
Pre procedural Dx: Diverticular abscess Post procedural Dx: Same  Technically successful CT guided placed of a 10 Fr drainage catheter placement into the diverticular abscess within the midline of the lower abdomen/upper pelvis yielding 180 cc of purulent fluid.    A representative aspirated sample was capped and sent to the laboratory for analysis.    EBL: Trace Complications: None immediate  Katherina Right, MD Pager #: [phone]

## 2021-06-06 NOTE — Progress Notes (Addendum)
PT Cancellation Note  Patient Details Name: Calissa Swenor MRN: [id number] DOB: 1951/08/12   Cancelled Treatment:    Reason Eval/Treat Not Completed: Patient at procedure or test/unavailable (IR).  Lillia Pauls, PT, DPT Acute Rehabilitation Services Pager [phone] Office [phone]    Norval Morton 06/06/2021, 9:06 AM

## 2021-06-07 LAB — GLUCOSE, CAPILLARY
Glucose-Capillary: 137 mg/dL — ABNORMAL HIGH (ref 70–99)
Glucose-Capillary: 144 mg/dL — ABNORMAL HIGH (ref 70–99)
Glucose-Capillary: 154 mg/dL — ABNORMAL HIGH (ref 70–99)
Glucose-Capillary: 161 mg/dL — ABNORMAL HIGH (ref 70–99)
Glucose-Capillary: 233 mg/dL — ABNORMAL HIGH (ref 70–99)
Glucose-Capillary: 233 mg/dL — ABNORMAL HIGH (ref 70–99)

## 2021-06-07 LAB — BASIC METABOLIC PANEL
Anion gap: 8 (ref 5–15)
BUN: 18 mg/dL (ref 8–23)
CO2: 30 mmol/L (ref 22–32)
Calcium: 8.9 mg/dL (ref 8.9–10.3)
Chloride: 100 mmol/L (ref 98–111)
Creatinine, Ser: 0.78 mg/dL (ref 0.44–1.00)
GFR, Estimated: >60 mL/min (ref >60)
Glucose, Bld: 136 mg/dL — ABNORMAL HIGH (ref 70–99)
Potassium: 4.2 mmol/L (ref 3.5–5.1)
Sodium: 138 mmol/L (ref 135–145)

## 2021-06-07 LAB — PHOSPHORUS: Phosphorus: 2.8 mg/dL (ref 2.5–4.6)

## 2021-06-07 LAB — MAGNESIUM: Magnesium: 1.9 mg/dL (ref 1.7–2.4)

## 2021-06-07 MED ORDER — TRAVASOL 10 % IV SOLN
INTRAVENOUS | Status: DC
  Filled 2021-06-07: qty 572.4

## 2021-06-07 NOTE — Progress Notes (Signed)
Referring Physician(s): Dr. Nadeen Landau  Supervising Physician: Markus Daft  Patient Status:  [facility] - In-pt  Chief Complaint: Intra-abdominal abscess  Subjective: Patient resting comfortably in chair.  Family at bedside.  On IV Zosyn.  Pelvic/midline drain in place with purulent-appearing output.  No concerns.   Allergies: Gluten meal  Medications: Prior to Admission medications   Medication Sig Start Date End Date Taking? Authorizing Provider  albuterol (VENTOLIN HFA) 108 (90 Base) MCG/ACT inhaler Inhale 2 puffs into the lungs every 6 (six) hours as needed for wheezing or shortness of breath.   Yes [provider]  amLODipine (NORVASC) 5 MG tablet Take 1 tablet (5 mg total) by mouth daily. 10/04/20  Yes Nahser, Wonda Cheng, MD  carvedilol (COREG) 25 MG tablet Take 1 tablet (25 mg total) by mouth 2 (two) times daily with a meal. 12/05/12  Yes Nahser, Wonda Cheng, MD  esomeprazole (NEXIUM) 20 MG capsule Take 1 capsule by mouth daily.   Yes [provider]  furosemide (LASIX) 20 MG tablet Take 1 tablet (20 mg total) by mouth daily. 05/22/21  Yes Hans Eden, NP  levothyroxine (SYNTHROID) 112 MCG tablet Take 1 tablet by mouth Monday through Saturday and 1/2 tablet on Sunday. 11/08/20  Yes Elayne Snare, MD  metFORMIN (GLUCOPHAGE) 500 MG tablet Take 500 mg by mouth 2 (two) times daily. 08/23/20  Yes [provider]  OVER THE COUNTER MEDICATION Take 1 tablet by mouth daily. Medication: Vitamin b12 3000 mcg   Yes [provider]  potassium chloride (KLOR-CON) 10 MEQ tablet Take 2 tablets (20 mEq total) by mouth daily. 11/07/20  Yes Nahser, Wonda Cheng, MD  predniSONE (DELTASONE) 20 MG tablet Take 60 mg by mouth daily with breakfast.   Yes [provider]  rosuvastatin (CRESTOR) 5 MG tablet Take 1 tablet (5 mg total) by mouth daily. 11/08/20  Yes Nahser, Wonda Cheng, MD  Blood Glucose Monitoring Suppl (ONETOUCH VERIO REFLECT) w/Device KIT 1 each by  Other route as directed. 08/10/20   [provider]  Fluticasone-Salmeterol (ADVAIR) 250-50 MCG/DOSE AEPB Inhale 1 puff into the lungs daily as needed for allergies.    [provider]  glucose blood (ONETOUCH VERIO) test strip Use as intstructed to check blood sugar once a day Dx Code E11.9 02/18/21   Elayne Snare, MD  Lancets [facility] ULTRASOFT) lancets Use as instructed 10/13/20   Elayne Snare, MD  meclizine (ANTIVERT) 25 MG tablet Take 1 tablet by mouth daily as needed for dizziness.    [provider]     Vital Signs: BP 102/66 (BP Location: Left Arm)   Pulse 84   Temp 97.6 F (36.4 C) (Oral)   Resp 17   Ht 5' 2"  (1.575 m)   Wt 240 lb 8.4 oz (109.1 kg)   SpO2 97%   BMI 43.99 kg/m   Physical Exam NAD, alert Abdomen: soft, non-tender.   Midline drain in place. Insertion site difficult to visualize due to body habitus and position in chair, however overall appears intact, clean, and dry.  Purulent-appearing output in collection bag.   Imaging: CT IMAGE GUIDED DRAINAGE BY PERCUTANEOUS CATHETER  Result Date: 06/06/2021 INDICATION: Diverticular abscess. Please perform image guided drainage catheter placement for infection source control purposes. Note, patient was to undergo image guided drainage catheter placement on 05/28/2021 however the procedure was terminated secondary to lack of an adequate percutaneous window. EXAM: ULTRASOUND CT-GUIDED LOWER ABDOMINAL/UPPER PELVIC DIVERTICULAR ABSCESS DRAINAGE CATHETER PLACEMENT COMPARISON:  CT abdomen  pelvis-06/03/2021; 05/26/2021 Aborted image guided drainage catheter placement-05/28/2021 MEDICATIONS: The patient is currently admitted to the hospital and receiving intravenous antibiotics. The antibiotics were administered within an appropriate time frame prior to the initiation of the procedure. ANESTHESIA/SEDATION: Moderate (conscious) sedation was employed during this procedure. A total of Versed 2 mg and Fentanyl 100 mcg  was administered intravenously. Moderate Sedation Time: 21 minutes. The patient's level of consciousness and vital signs were monitored continuously by radiology nursing throughout the procedure under my direct supervision. CONTRAST:  None COMPLICATIONS: None immediate. PROCEDURE: Informed written consent was obtained from the patient after a discussion of the risks, benefits and alternatives to treatment. The patient was placed supine on the CT gantry and a pre procedural CT was performed re-demonstrating the known abscess/fluid collection within the midline of the lower abdomen/pelvis with dominant ill-defined air in fluid containing component measuring at least 9.1 x 7.1 cm (image 13, series 2). The CT gantry table position was marked and the collection was identified sonographically. The procedure was planned. A timeout was performed prior to the initiation of the procedure. The skin overlying the ventral aspect of the lower abdomen/upper pelvis was prepped and draped in the usual sterile fashion. The overlying soft tissues were anesthetized with 1% lidocaine with epinephrine. Under direct ultrasound guidance, the complex fluid collection was accessed with 18 gauge trocar needle. Multiple ultrasound images were saved procedural documentation purposes. A small amount of purulent fluid was aspirated and a short Amplatz wire was coiled within the collection. Appropriate positioning was confirmed with a limited CT scan. The tract was serially dilated allowing placement of a 10 [place] all-purpose drainage catheter. Appropriate positioning was confirmed with a limited postprocedural CT scan. Approximately 180 ml of purulent fluid was aspirated. The tube was connected to a drainage bag and sutured in place. A dressing was placed. The patient tolerated the procedure well without immediate post procedural complication. IMPRESSION: Successful ultrasound and CT guided placement of a 10 [place] all purpose drain catheter  into the midline of the lower abdomen/upper pelvis with aspiration of 180 mL of purulent fluid. Samples were sent to the laboratory as requested by the ordering clinical team. Electronically Signed   By: Sandi Mariscal M.D.   On: 06/06/2021 14:30    Labs:  CBC: Recent Labs    06/02/21 1750 06/03/21 0312 06/03/21 1211 06/03/21 1226 06/03/21 2013 06/04/21 0400 06/05/21 0512  WBC 17.9* 16.3*  --  15.5*  --   --  14.8*  HGB 8.0* 7.2*   < > 7.3* 7.2* 6.6* 8.5*  HCT 24.6* 21.7*   < > 22.4* 22.1* 20.7* 26.1*  PLT 144* 157  --  176  --   --  209   < > = values in this interval not displayed.    COAGS: Recent Labs    06/06/21 0230  INR 1.1    BMP: Recent Labs    11/07/20 1058 03/26/21 1927 06/04/21 0400 06/05/21 0500 06/06/21 0230 06/07/21 0350  NA 141   < > 136 136 138 138  K 4.1   < > 3.8 4.0 3.9 4.2  CL 103   < > 102 99 99 100  CO2 24   < > 29 30 27 30   GLUCOSE 118*   < > 123* 143* 123* 136*  BUN 13   < > 10 9 10 18   CALCIUM 9.9   < > 8.4* 8.5* 8.9 8.9  CREATININE 1.06*   < > 0.73 0.66 0.65 0.78  GFRNONAA 53*   < > >60 >60 >60 >60  GFRAA 61  --   --   --   --   --    < > = values in this interval not displayed.    LIVER FUNCTION TESTS: Recent Labs    06/01/21 0503 06/03/21 0845 06/05/21 0500 06/06/21 0230  BILITOT 0.7 0.6 0.5 0.6  AST 16 14* 24 24  ALT 19 19 25 28   ALKPHOS 99 94 93 93  PROT 4.1* 4.6* 4.4* 4.5*  ALBUMIN 1.7* 1.9* 2.0* 2.0*    Assessment and Plan: Intra-abdominal abscess s/p drain placement 6/21 by Dr. Charletta Cousin remains in place without issue today.  Draining purulent-appearing fluid.  On TPN, trial sipping water.  On IV Zosyn, culture positive for moderate enterococcus faecalis.  Orders for flushes in place.  Maintain flushing TID, record output daily.   IR following.   Electronically Signed: Docia Barrier, PA 06/07/2021, 2:11 PM   I spent a total of 15 Minutes at the the patient's bedside AND on the patient's hospital  floor or unit, greater than 50% of which was counseling/coordinating care for intra-abdominal abscess.

## 2021-06-07 NOTE — Progress Notes (Addendum)
PROGRESS NOTE    [name]  IOE:[id number] DOB: 11/06/1951 DOA: 05/26/2021 PCP: Ollen Bowl, MD    Brief Narrative:  Mrs. [name] was admitted to the hospital with the working diagnosis of perforated diverticulitis complicated with intra-abdominal abscess, pulmonary embolism and acute blood loss anemia (upper GI bleed).     70 year old female past medical history for diastolic heart failure, hypertension, type 2 diabetes mellitus, and obstructive sleep apnea who presented with abdominal pain, nausea, vomiting and diarrhea for 2 weeks.  Patient was referred to the ED by her primary care provider.  On her initial physical examination she was hypotensive that improved with IV fluids to 110/82, heart rate 95, respiratory rate 22, oxygen saturation 96%, her lungs were clear to auscultation, heart S1-S2, present, rhythmic, soft tender to palpation, no rebound or guarding, no lower extremity edema.   Sodium 134, potassium 2.8, chloride 98, bicarb 24, glucose 287, BUN 23, creatinine 1.28, white count 8.2, hemoglobin 13.1, hematocrit 41.0, platelets clumped.   CT of the abdomen pelvis with perforated diverticulitis involving the mid sigmoid colon with a large 9.2x5.8 complex abscess anterior to the uterus and in between distal small bowel loops.  Bilateral central and lower lobe pulmonary emboli.   Chest radiograph no infiltrates.   EKG 99 bpm, left axis deviation, left anterior fascicular block, normal intervals, sinus rhythm with PACs, no significant ST segment or T wave changes.   Patient placed on broad spectrum antibiotic therapy and surgery/ ID were consulted. Patient placed on TPN for nutrition and anticoagulated for pulmonary embolism.   Positive GI bleeding that obligated discontinuation of anticoagulation. IVC filter was placed. Follow up abdomen and pelvis CT with worsening abscess.   06/21: 10 Fr drainage catheter placed into the diverticular abscess per IR   Tolerating  well full liquids, tapering off TPN.   Assessment & Plan:   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 (HCC)   Moderate persistent asthma, uncomplicated   Seizure (HCC)   Bilateral pulmonary embolism (HCC)      Acute diverticulitis, complicated with intra-abdominal abscess. (no sepsis) No abdominal pain, no nausea or vomiting, she has been afebrile.  Abscess drain culture positive for gram positive cocci in pairs.    Tolerating well antibiotic therapy with Zosyn # 11  Continue tapering off TPN.  Now on full liquids with good toleration, ok for soft diet per surgery recommendations.   Ok to transfer to medical ward, discontinue telemetry.    2. Acute bilateral pulmonary embolism/ acute hypoxemic respiratory failure. Sp IVC filter. sp PRBC transfusion   If cell count stable in am, will resume anticoagulation with heparin and if good toleration oral anticoagulation.    3. Acute blood loss anemia, due to GI bleeding.  Sp PRBC 1 unit PRBC transfusion   Pending check cell count in am.    4. Uncontrolled T2DM Hyper and hypoglycemia. Fasting glucose this am was 143, Fasting glucose this am 136, will continue insulin per TPN, will resume sq basal when off TPN. Continue glucose cover and monitoring with insulin sliding scale.    5. CKD stage 3a  stable renal function and electrolytes, plan to dc TPN in am.  Avoid nephrotoxic medications or hypotension    6. Heart failure with diastolic dysfunction No current signs of acute  decompensation   7. Hyperthyroidism. On levothyroxine and steroid taper.   Status is: Inpatient  Remains inpatient appropriate because:IV treatments appropriate due to intensity of illness or inability to take PO  Dispo: The patient is from: Home              Anticipated d/c is to: CIR              Patient currently is  not medically stable to d/c.   Difficult to place patient No    DVT prophylaxis: Ivc filter   Code Status:   full  Family Communication:  I spoke with patient's daughter at the bedside, we talked in detail about patient's condition, plan of care and prognosis and all questions were addressed.     Consultants:  Surgery  ID   Procedures:  Abdominal abscess drain   Antimicrobials:  Zosyn     Subjective: Patient is out of bed to the chair, no nausea or vomiting, tolerating po well,. No chest pain or abdominal pain   Objective: Vitals:   06/06/21 2305 06/07/21 0305 06/07/21 0748 06/07/21 1111  BP: 111/77 137/89 110/84 102/66  Pulse: 89 73 93 84  Resp: 16 14 19 17   Temp: 97.8 F (36.6 C) 98 F (36.7 C) 98.6 F (37 C) 97.6 F (36.4 C)  TempSrc: Oral Oral Oral Oral  SpO2: 95% 95% 94% 97%  Weight:      Height:        Intake/Output Summary (Last 24 hours) at 06/07/2021 1246 Last data filed at 06/07/2021 1200 Gross per 24 hour  Intake 2268.93 ml  Output 1775 ml  Net 493.93 ml   Filed Weights   05/27/21 0041  Weight: 109.1 kg    Examination:   General: Not in pain or dyspnea, deconditioned  Neurology: Awake and alert, non focal  E ENT: no pallor, no icterus, oral mucosa moist, exophthalmos  Cardiovascular: No JVD. S1-S2 present, rhythmic, no gallops, rubs, or murmurs. + pitting bilateral lower extremity edema. Pulmonary: positive breath sounds bilaterally, adequate air movement, no wheezing, rhonchi or rales. Gastrointestinal. Abdomen soft and non tender Skin. No rashes Musculoskeletal: no joint deformities     Data Reviewed: I have personally reviewed following labs and imaging studies  CBC: Recent Labs  Lab 06/01/21 0503 06/01/21 0912 06/02/21 0728 06/02/21 1750 06/03/21 0312 06/03/21 1211 06/03/21 1226 06/03/21 2013 06/04/21 0400 06/05/21 0512  WBC 12.9*  --  16.5* 17.9* 16.3*  --  15.5*  --   --  14.8*  NEUTROABS 9.8*  --   --   --   --   --   13.1*  --   --  10.6*  HGB 7.2*   < > 7.6* 8.0* 7.2* 7.1* 7.3* 7.2* 6.6* 8.5*  HCT 22.5*   < > 23.3* 24.6* 21.7* 22.1* 22.4* 22.1* 20.7* 26.1*  MCV 96.6  --  96.7 94.6 95.6  --  96.1  --   --  94.9  PLT 154  --  151 144* 157  --  176  --   --  209   < > = values in this interval not displayed.   Basic Metabolic Panel: Recent Labs  Lab 06/03/21 0845 06/04/21 0400 06/05/21 0500 06/06/21 0230 06/07/21 0350  NA 137 136 136 138 138  K 4.3 3.8 4.0 3.9 4.2  CL 105 102 99 99 100  CO2 25 29 30 27 30   GLUCOSE 236* 123* 143* 123* 136*  BUN 12 10 9 10 18   CREATININE 0.70 0.73 0.66 0.65 0.78  CALCIUM 8.7* 8.4* 8.5* 8.9 8.9  MG  1.7 1.6* 1.9 1.8 1.9  PHOS 2.3* 2.3* 3.1 3.1 2.8   GFR: Estimated Creatinine Clearance: 76.1 mL/min (by C-G formula based on SCr of 0.78 mg/dL). Liver Function Tests: Recent Labs  Lab 06/01/21 0503 06/03/21 0845 06/05/21 0500 06/06/21 0230  AST 16 14* 24 24  ALT 19 19 25 28   ALKPHOS 99 94 93 93  BILITOT 0.7 0.6 0.5 0.6  PROT 4.1* 4.6* 4.4* 4.5*  ALBUMIN 1.7* 1.9* 2.0* 2.0*   No results for input(s): LIPASE, AMYLASE in the last 168 hours. No results for input(s): AMMONIA in the last 168 hours. Coagulation Profile: Recent Labs  Lab 06/06/21 0230  INR 1.1   Cardiac Enzymes: No results for input(s): CKTOTAL, CKMB, CKMBINDEX, TROPONINI in the last 168 hours. BNP (last 3 results) No results for input(s): PROBNP in the last 8760 hours. HbA1C: No results for input(s): HGBA1C in the last 72 hours. CBG: Recent Labs  Lab 06/06/21 1931 06/06/21 2346 06/07/21 0335 06/07/21 0745 06/07/21 1121  GLUCAP 279* 212* 137* 144* 154*   Lipid Profile: Recent Labs    06/05/21 0500  TRIG 94   Thyroid Function Tests: No results for input(s): TSH, T4TOTAL, FREET4, T3FREE, THYROIDAB in the last 72 hours. Anemia Panel: No results for input(s): VITAMINB12, FOLATE, FERRITIN, TIBC, IRON, RETICCTPCT in the last 72 hours.    Radiology Studies: I have reviewed  all of the imaging during this hospital visit personally     Scheduled Meds:  Chlorhexidine Gluconate Cloth  6 each Topical Daily   feeding supplement  237 mL Oral BID BM   insulin aspart  0-20 Units Subcutaneous Q4H   levothyroxine  112 mcg Oral Once per day on Mon Tue Wed Thu Fri Sat   And   levothyroxine  56 mcg Oral Once per day on Sun   methylPREDNISolone (SOLU-MEDROL) injection  16 mg Intravenous Daily   Followed by   Mon ON 06/11/2021] methylPREDNISolone (SOLU-MEDROL) injection  8 mg Intravenous Daily   pantoprazole  40 mg Oral BID   sodium chloride flush  10-40 mL Intracatheter Q12H   sodium chloride flush  5 mL Intracatheter Q8H   Continuous Infusions:  sodium chloride     sodium chloride     piperacillin-tazobactam (ZOSYN)  IV 3.375 g (06/07/21 1225)   TPN ADULT (ION) 65 mL/hr at 06/06/21 2000   TPN ADULT (ION)       LOS: 12 days        Rolando Whitby 06/08/21, MD

## 2021-06-07 NOTE — Progress Notes (Signed)
Physical Therapy Treatment Patient Details Name: [name] MRN: [id number] DOB: October 24, 1951 Today's Date: 06/07/2021    History of Present Illness 70 y.o. female admitted on 05/26/21 for sigmoid diverticulitis with perforation and abscess, IR consulted for surgery vs drain placement and had an unsucessful attempt on 05/28/21.  Bil PE started on IV heparin. S/p CT guided placement of a 10 Fr drainage catheter placement into the diverticular abscess within the midline of the lower abdomen/upper pelvis 6/21. PMH including anemia, CHF, DM, graves disease, HTN, obesity, stroke, seizure, and thyroid disease.    PT Comments    Pt is making good progress with physical therapy, ambulating an increased distance of up to ~37 ft before fatiguing. Pt is needing minA physically to steady and direct her during mobility but max cues to sequence turns and remind pt to clear her feet and keep the RW proximal to her. Pt with slow processing, STM deficits, and poor problem-solving skills. Pt is at high risk for falls. Pt is very motivated to improve and demonstrated this determination through progressing from needing maxA to transfer to stand the first rep to only needing minA by the final rep. Will continue to follow acutely. Current recommendations remain appropriate.   Follow Up Recommendations  CIR     Equipment Recommendations  Rolling walker with 5" wheels;Wheelchair (measurements PT);Wheelchair cushion (measurements PT);Hospital bed    Recommendations for Other Services       Precautions / Restrictions Precautions Precautions: Fall Precaution Comments: flexiseal, R gravity drain Restrictions Weight Bearing Restrictions: No    Mobility  Bed Mobility Overal bed mobility: Needs Assistance Bed Mobility: Rolling;Sidelying to Sit;Sit to Sidelying Rolling: Min assist Sidelying to sit: Min assist;HOB elevated     Sit to sidelying: Mod assist General bed mobility comments: Cued pt to log roll  to R with use of bed rail. Pt quick to roll and needing blocking to prevent roll off bed. Cues to bring legs off bed and ascend trunk with R UE, minA. ModA to manage legs to return to supine.    Transfers Overall transfer level: Needs assistance Equipment used: Rolling walker (2 wheeled) Transfers: Sit to/from Stand Sit to Stand: Mod assist;Min assist;Max assist         General transfer comment: Sit <> stand 3x from EOB, progressing from needing maxA first rep > modA second rep > minA 3rd rep. Repeated cues to push up from bed and pretend RW was not present to avoid pt from jumping hands to RW to pull up, success noted. Pt rocks 3x prior to transfer attempt to gain momentum.  Ambulation/Gait Ambulation/Gait assistance: Min assist Gait Distance (Feet): 37 Feet Assistive device: Rolling walker (2 wheeled) Gait Pattern/deviations: Decreased stride length;Trunk flexed;Step-through pattern;Shuffle Gait velocity: decreased Gait velocity interpretation: <1.31 ft/sec, indicative of household ambulator General Gait Details: Pt with trunk flexed, taking small slow steps. Pt with poor feet clearance. Needs cues to remain RW proximal to body when stepping posteriorly. Cues to sequence turns.   Stairs             Wheelchair Mobility    Modified Rankin (Stroke Patients Only)       Balance Overall balance assessment: Needs assistance Sitting-balance support: No upper extremity supported;Feet supported Sitting balance-Leahy Scale: Fair     Standing balance support: Bilateral upper extremity supported;During functional activity Standing balance-Leahy Scale: Poor Standing balance comment: reliant on support in standing.  Cognition Arousal/Alertness: Awake/alert Behavior During Therapy: WFL for tasks assessed/performed Overall Cognitive Status: Impaired/Different from baseline Area of Impairment: Memory;Following  commands;Safety/judgement;Problem solving;Orientation;Attention                 Orientation Level: Disoriented to;Time (pt asking if it was morning) Current Attention Level: Sustained Memory: Decreased short-term memory Following Commands: Follows one step commands with increased time;Follows one step commands consistently;Follows multi-step commands inconsistently Safety/Judgement: Decreased awareness of safety Awareness: Intellectual Problem Solving: Difficulty sequencing;Requires verbal cues;Requires tactile cues;Slow processing General Comments: Pt disoriented to time, asking if it was morning. This may be due to just waking from a nap. Pt with moments of slow processing and needing repeated single step cues. Poor sequencing, needing continual directions with mobility.      Exercises Other Exercises Other Exercises: Sit <> stand 3x from EOB    General Comments        Pertinent Vitals/Pain Pain Assessment: Faces Faces Pain Scale: Hurts a little bit Pain Location: abdomen Pain Descriptors / Indicators: Discomfort;Grimacing;Guarding Pain Intervention(s): Monitored during session;Limited activity within patient's tolerance;Repositioned    Home Living                      Prior Function            PT Goals (current goals can now be found in the care plan section) Acute Rehab PT Goals Patient Stated Goal: to improve PT Goal Formulation: With patient Time For Goal Achievement: 06/13/21 Potential to Achieve Goals: Good Progress towards PT goals: Progressing toward goals    Frequency    Min 4X/week      PT Plan Current plan remains appropriate    Co-evaluation              AM-PAC PT "6 Clicks" Mobility   Outcome Measure  Help needed turning from your back to your side while in a flat bed without using bedrails?: A Little Help needed moving from lying on your back to sitting on the side of a flat bed without using bedrails?: A Little Help  needed moving to and from a bed to a chair (including a wheelchair)?: A Lot Help needed standing up from a chair using your arms (e.g., wheelchair or bedside chair)?: A Lot Help needed to walk in hospital room?: A Little Help needed climbing 3-5 steps with a railing? : Total 6 Click Score: 14    End of Session Equipment Utilized During Treatment: Gait belt Activity Tolerance: Patient tolerated treatment well Patient left: with call bell/phone within reach;in bed;with bed alarm set Nurse Communication: Mobility status;Other (comment) (confusion) PT Visit Diagnosis: Muscle weakness (generalized) (M62.81);Difficulty in walking, not elsewhere classified (R26.2);Unsteadiness on feet (R26.81);Other abnormalities of gait and mobility (R26.89)     Time: 4401-0272 PT Time Calculation (min) (ACUTE ONLY): 30 min  Charges:  $Gait Training: 8-22 mins $Therapeutic Activity: 8-22 mins                     Raymond Gurney, PT, DPT Acute Rehabilitation Services  Pager: [phone] Office: [phone]    [name] 06/07/2021, 6:47 PM

## 2021-06-07 NOTE — Progress Notes (Signed)
PHARMACY - TOTAL PARENTERAL NUTRITION CONSULT NOTE   Indication:  Intolerance to enteral feeding , diverticulitis w/ intra-abdominal abscess  Patient Measurements: Height: 5\' 2"  (157.5 cm) Weight: 109.1 kg (240 lb 8.4 oz) IBW/kg (Calculated) : 50.1 TPN AdjBW (KG): 64.9 Body mass index is 43.99 kg/m.  Assessment: 70 years of age female with known celiac disease, CHF, HTN, and DM admitted on 05/26/21 with perforated sigmoid diverticulitis and abscess as well as pulmonary embolism. IR unable to find a safe window for abscess drain placement and General Surgery recommending no surgery at this time. Attempted full liquids but had increased abdominal pain and loose stools so backed back down to clear liquids. Currently having bowel movements. Day #6 of hospital stay without adequate intake and patient has had N/V/D for 2 weeks prior to admission.   Daughter reports abdominal pain starting around 2 weeks ago which led to decreased oral intake which has gotten progressively worse leading up to admission. Since abdominal pain started, maybe would eat around 2 times per day.  Daughter reports patient would eat healthy (vegetables, fish, eggs) prior to abdominal pain. Drinks Glucerna shakes throughout the day.  Reports ~30 pound weight loss over the past 6 months. Patient is at risk for refeeding.   Started on FLD 6/21- has been tolerating, denies nausea/vomiting or abdominal pain.  Per MD wean TPN to half today.  Glucose / Insulin: hx DM, CBGs <200. Lantus 6 units daily, regular insulin 40 units added to TPN on 6/21.  rSSI 39 units used. IV steroid taper.   Electrolytes: Na 138, K 4.2, Mg 1.9, Phos 2.8, CoCa 10.5 Renal: SCr <1, BUN 18 Hepatic: LFTs/Tbili wnl, TG 94, albumin 2, prealbumin 24.2 Intake / Output; MIVF: UOP 3150 mL, Last BM 6/21, no MIVF GI Imaging:  6/10 CT A/P: perforated diverticulitis w/ large abscess  6/18 CT A/P: persistent and increasing abscess within lower abd/pelvis interposed  between multiple loops of inflamed small bowel precluding perc drain GI Surgeries / Procedures:  6/16 EGD: normal esophagus, erythematous mucosa, duodenitis, healed ulcer  Central access: Double lumen PICC 6/16 TPN start date: 6/16  Nutritional Goals (per RD recommendation on 05/29/21): kCal: 2000-2200, Protein: 110-120, Fluid: >= 2L Goal TPN 90 ml/hr will provide 115g AA, 63g ILE, and 297g CHO for a total of 2100 kcal/day  Current Nutrition:  Full liquids and TPN   Plan:  Reduce TPN to 45 mL/hr at 1800 (meeting ~50% nutrition needs) Electrolytes in TPN (adjusted to keep same overall amount as previous TPN bag): Na 51mEq/L, K 24mEq/L, Ca 88mEq/L, Mg 10 mEq/L, and Phos 15mmol/L. Cl:Ac 1:1 Add standard MVI and trace elements to TPN Adjust regular insulin to 20 units in TPN bag given reduction in rate  Continue Resistant SSI q4h  Monitor TPN labs on Mon/Thurs F/u ability to advance diet and wean TPN further  Thank you for involving pharmacy in this patient's care.  12m, PharmD PGY-1 Acute Care Pharmacy Resident 06/07/2021 8:19 AM  **Pharmacist phone directory can be found on [email or web address] listed under [facility]**

## 2021-06-07 NOTE — Progress Notes (Signed)
Occupational Therapy Treatment Patient Details Name: [name] MRN: [id number] DOB: 02/17/1951 Today's Date: 06/07/2021    History of present illness 70 y.o. female admitted on 05/26/21 for sigmoid diverticulitis with perforation and abscess, IR consulted for surgery vs drain placement and had an unsucessful attempt on 05/28/21.  Bil PE started on IV heparin. S/p CT guided placement of a 10 Fr drainage catheter placement into the diverticular abscess within the midline of the lower abdomen/upper pelvis 6/21. PMH including anemia, CHF, DM, graves disease, HTN, obesity, stroke, seizure, and thyroid disease.   OT comments   Pt progressing today for OOB transfers and ADL. Pt unable to perform sit to stand with RW and +1 assist; pt modA to maxA  with stedy for sit to stand from bed, from stedy x2 and from built up recliner. Pt's session focused on bed mobility, transfers using stedy and sock donning/grooming. Pt with minimal pain at that time and very motivated to get OOB. Pt continues to require set-upA to totalA for ADL and minA for bed  mobility; modA to maxA for sit to stands. Pt's daughter in room and appears motivated with her present. Pt would greatly benefit from continued OT skilled services. OT following acutely. BP sitting 118/87, 95% O2 on RA and 91 BPM; max HR 132 BPM very brief.    Follow Up Recommendations  CIR    Equipment Recommendations  None recommended by OT    Recommendations for Other Services      Precautions / Restrictions Precautions Precautions: Fall Precaution Comments: flexiseal, R gravity drain Restrictions Weight Bearing Restrictions: No       Mobility Bed Mobility Overal bed mobility: Needs Assistance Bed Mobility: Rolling;Sidelying to Sit Rolling: Min guard Sidelying to sit: Min assist     Sit to sidelying: Mod assist General bed mobility comments: log roll; RLE going way over and requiring assist for control    Transfers Overall transfer  level: Needs assistance Equipment used: Ambulation equipment used Transfers: Sit to/from Stand;Stand Pivot Transfers Sit to Stand: Max assist;From elevated surface Stand pivot transfers: Max assist;From elevated surface       General transfer comment: Sit to stand x5 times from bed x2, xstedy x2 and from recliner x1 with momentum ranging from modA to maxA    Balance Overall balance assessment: Needs assistance Sitting-balance support: No upper extremity supported;Feet supported Sitting balance-Leahy Scale: Fair     Standing balance support: Bilateral upper extremity supported;During functional activity Standing balance-Leahy Scale: Poor Standing balance comment: reliant on support in standing.                           ADL either performed or assessed with clinical judgement   ADL Overall ADL's : Needs assistance/impaired     Grooming: Set up;Wash/dry face               Lower Body Dressing: Total assistance;Bed level Lower Body Dressing Details (indicate cue type and reason): no ability to assist Toilet Transfer: Moderate assistance Toilet Transfer Details (indicate cue type and reason): use of stedy to recliner Toileting- Clothing Manipulation and Hygiene: Total assistance       Functional mobility during ADLs: Moderate assistance;Cueing for safety;Cueing for sequencing (Pt unable to perform sit to stand with RW and +1 assist; pt modA with stedy for sit to stand from bed, from stedy x2 and from built up recliner.) General ADL Comments: Pt's session focused on bed mobility, transfers using stedy  and sock donning/grooming. Pt with minimal pain at that time and very motivated to get OOB.     Vision       Perception     Praxis      Cognition Arousal/Alertness: Awake/alert Behavior During Therapy: WFL for tasks assessed/performed Overall Cognitive Status: Impaired/Different from baseline Area of Impairment: Memory;Following  commands;Safety/judgement;Problem solving;Orientation;Attention                 Orientation Level: Disoriented to;Time Current Attention Level: Sustained Memory: Decreased short-term memory Following Commands: Follows one step commands with increased time;Follows one step commands consistently;Follows multi-step commands inconsistently Safety/Judgement: Decreased awareness of safety Awareness: Intellectual Problem Solving: Difficulty sequencing;Requires verbal cues;Requires tactile cues;Slow processing General Comments: Pt continues to have poor memory and often asking "what am I doing now?" Pt able to folow simple commands in short phrases. Pt appears very motivated. DAughter in room encouraging to pt.        Exercises Exercises: Other exercises Other Exercises Other Exercises: sit to stand x5   Shoulder Instructions       General Comments BP sitting 118/87, 95% O2 on RA and 91 BPM; max HR 132 BPM very brief.    Pertinent Vitals/ Pain       Pain Assessment: Faces Faces Pain Scale: Hurts a little bit Pain Location: abdomen Pain Descriptors / Indicators: Discomfort Pain Intervention(s): Monitored during session;Premedicated before session  Home Living                                          Prior Functioning/Environment              Frequency  Min 2X/week        Progress Toward Goals  OT Goals(current goals can now be found in the care plan section)  Progress towards OT goals: Progressing toward goals  Acute Rehab OT Goals Patient Stated Goal: to improve OT Goal Formulation: With patient Time For Goal Achievement: 06/15/21 Potential to Achieve Goals: Good  Plan Discharge plan remains appropriate    Co-evaluation                 AM-PAC OT "6 Clicks" Daily Activity     Outcome Measure   Help from another person eating meals?: A Lot Help from another person taking care of personal grooming?: A Little Help from another  person toileting, which includes using toliet, bedpan, or urinal?: Total Help from another person bathing (including washing, rinsing, drying)?: A Lot Help from another person to put on and taking off regular upper body clothing?: A Lot Help from another person to put on and taking off regular lower body clothing?: Total 6 Click Score: 11    End of Session Equipment Utilized During Treatment: Gait belt;Rolling walker;Oxygen  OT Visit Diagnosis: Unsteadiness on feet (R26.81);Other abnormalities of gait and mobility (R26.89);Muscle weakness (generalized) (M62.81);Pain Pain - part of body:  (abdomen)   Activity Tolerance Patient tolerated treatment well   Patient Left in chair;with call bell/phone within reach   Nurse Communication Mobility status        Time: 1120-1210 OT Time Calculation (min): 50 min  Charges: OT General Charges $OT Visit: 1 Visit OT Treatments $Self Care/Home Management : 8-22 mins $Therapeutic Activity: 23-37 mins  [name], OTR/L Acute Rehabilitation Services Pager: [phone] Office: [phone]    [name] 06/07/2021, 8:20 PM

## 2021-06-07 NOTE — Progress Notes (Signed)
Progress Note  6 Days Post-Op  Subjective: CC: Feeling great. No complaints. Tolerating full liquids without n/v. Denies any abdominal pain. Passing gas/BM.   Objective: Vital signs in last 24 hours: Temp:  [97.7 F (36.5 C)-98.7 F (37.1 C)] 98 F (36.7 C) (06/22 0305) Pulse Rate:  [65-94] 73 (06/22 0305) Resp:  [12-20] 14 (06/22 0305) BP: (106-137)/(74-89) 137/89 (06/22 0305) SpO2:  [94 %-100 %] 95 % (06/22 0305) Last BM Date: 06/06/21  Intake/Output from previous day: 06/21 0701 - 06/22 0700 In: 1633.9 [P.O.:720; I.V.:752.1; IV Piggyback:156.8] Out: 3705 [Urine:3150; Drains:205; Stool:350] Intake/Output this shift: No intake/output data recorded.  PE: General: pleasant, WD, female who is laying in bed in NAD HEENT: head is normocephalic.  Mouth is pink and moist Heart: rrr Lungs: normal work of breathing Abd: soft, NT, ND. Purulent fluid in drain appliance, no apparent stool Skin: warm and dry Psych: A&Ox3 with an appropriate affect.    Lab Results:  Recent Labs    06/05/21 0512  WBC 14.8*  HGB 8.5*  HCT 26.1*  PLT 209   BMET Recent Labs    06/06/21 0230 06/07/21 0350  NA 138 138  K 3.9 4.2  CL 99 100  CO2 27 30  GLUCOSE 123* 136*  BUN 10 18  CREATININE 0.65 0.78  CALCIUM 8.9 8.9   PT/INR Recent Labs    06/06/21 0230  LABPROT 14.2  INR 1.1   CMP     Component Value Date/Time   NA 138 06/07/2021 0350   NA 141 11/07/2020 1058   K 4.2 06/07/2021 0350   CL 100 06/07/2021 0350   CO2 30 06/07/2021 0350   GLUCOSE 136 (H) 06/07/2021 0350   BUN 18 06/07/2021 0350   BUN 13 11/07/2020 1058   CREATININE 0.78 06/07/2021 0350   CALCIUM 8.9 06/07/2021 0350   PROT 4.5 (L) 06/06/2021 0230   PROT 6.5 11/07/2020 1058   ALBUMIN 2.0 (L) 06/06/2021 0230   ALBUMIN 4.3 11/07/2020 1058   AST 24 06/06/2021 0230   ALT 28 06/06/2021 0230   ALKPHOS 93 06/06/2021 0230   BILITOT 0.6 06/06/2021 0230   BILITOT 0.6 11/07/2020 1058   GFRNONAA >60 06/07/2021  0350   GFRAA 61 11/07/2020 1058   Lipase     Component Value Date/Time   LIPASE 19 05/26/2021 1601       Studies/Results: CT IMAGE GUIDED DRAINAGE BY PERCUTANEOUS CATHETER  Result Date: 06/06/2021 INDICATION: Diverticular abscess. Please perform image guided drainage catheter placement for infection source control purposes. Note, patient was to undergo image guided drainage catheter placement on 05/28/2021 however the procedure was terminated secondary to lack of an adequate percutaneous window. EXAM: ULTRASOUND CT-GUIDED LOWER ABDOMINAL/UPPER PELVIC DIVERTICULAR ABSCESS DRAINAGE CATHETER PLACEMENT COMPARISON:  CT abdomen pelvis-06/03/2021; 05/26/2021 Aborted image guided drainage catheter placement-05/28/2021 MEDICATIONS: The patient is currently admitted to the hospital and receiving intravenous antibiotics. The antibiotics were administered within an appropriate time frame prior to the initiation of the procedure. ANESTHESIA/SEDATION: Moderate (conscious) sedation was employed during this procedure. A total of Versed 2 mg and Fentanyl 100 mcg was administered intravenously. Moderate Sedation Time: 21 minutes. The patient's level of consciousness and vital signs were monitored continuously by radiology nursing throughout the procedure under my direct supervision. CONTRAST:  None COMPLICATIONS: None immediate. PROCEDURE: Informed written consent was obtained from the patient after a discussion of the risks, benefits and alternatives to treatment. The patient was placed supine on the CT gantry and a pre procedural CT was  performed re-demonstrating the known abscess/fluid collection within the midline of the lower abdomen/pelvis with dominant ill-defined air in fluid containing component measuring at least 9.1 x 7.1 cm (image 13, series 2). The CT gantry table position was marked and the collection was identified sonographically. The procedure was planned. A timeout was performed prior to the  initiation of the procedure. The skin overlying the ventral aspect of the lower abdomen/upper pelvis was prepped and draped in the usual sterile fashion. The overlying soft tissues were anesthetized with 1% lidocaine with epinephrine. Under direct ultrasound guidance, the complex fluid collection was accessed with 18 gauge trocar needle. Multiple ultrasound images were saved procedural documentation purposes. A small amount of purulent fluid was aspirated and a short Amplatz wire was coiled within the collection. Appropriate positioning was confirmed with a limited CT scan. The tract was serially dilated allowing placement of a 10 [place] all-purpose drainage catheter. Appropriate positioning was confirmed with a limited postprocedural CT scan. Approximately 180 ml of purulent fluid was aspirated. The tube was connected to a drainage bag and sutured in place. A dressing was placed. The patient tolerated the procedure well without immediate post procedural complication. IMPRESSION: Successful ultrasound and CT guided placement of a 10 French all purpose drain catheter into the midline of the lower abdomen/upper pelvis with aspiration of 180 mL of purulent fluid. Samples were sent to the laboratory as requested by the ordering clinical team. Electronically Signed   By: Simonne Come M.D.   On: 06/06/2021 14:30    Anti-infectives: Anti-infectives (From admission, onward)    Start     Dose/Rate Route Frequency Ordered Stop   05/27/21 0500  piperacillin-tazobactam (ZOSYN) IVPB 3.375 g        3.375 g 12.5 mL/hr over 240 Minutes Intravenous Every 8 hours 05/26/21 2126     05/26/21 2015  ceFEPIme (MAXIPIME) 2 g in sodium chloride 0.9 % 100 mL IVPB       See Hyperspace for full Linked Orders Report.   2 g 200 mL/hr over 30 Minutes Intravenous  Once 05/26/21 2004 05/26/21 2107   05/26/21 2015  metroNIDAZOLE (FLAGYL) IVPB 500 mg       See Hyperspace for full Linked Orders Report.   500 mg 100 mL/hr over 60 Minutes  Intravenous  Once 05/26/21 2004 05/26/21 2135        Assessment/Plan 54F Diverticulitis with intra-abdominal abscess (9x5 cm) - afebrile, WBC downtrending; is on chronic steroids as well with wean attempts underway. Recheck CBC tomorrow - No emergent surgical needs at this juncture - OOB/mobilize with therapies  - Dr. Cliffton Asters had extensive discussion with her yesterday regarding treatment options including surgery and related risks because of her underlying chronic steroid needs.  - Continue drain and IV abx - ID is following   FEN: ok for soft diet ID: Continue Zosyn today VTE: SCD's, hep gtt as per primary service Foley: none, external cath Dispo: SDU   - Below per primary service - Bilateral PE-heparin IV held due to hgb requiring transfusion. Hgb 7.2 this am from 8.0. S/p IVCF 6/16 by IR Melena - EGD 6/16 normal esophagus, erythematous mucosa in the antrum, duodenitis but no active bleeding. GI signed off. Acute respiratory failure with hypoxia Thombocytopenia Chronic diastolic heart failure CKD IIIa HTN Graves dz DM2 Morbid obesity     LOS: 12 days   Marin Olp, MD [facility] Use [email or web address] to contact on call provider

## 2021-06-08 LAB — CBC WITH DIFFERENTIAL/PLATELET
Abs Immature Granulocytes: 0.59 10*3/uL — ABNORMAL HIGH (ref 0.00–0.07)
Basophils Absolute: 0.1 10*3/uL (ref 0.0–0.1)
Basophils Relative: 1 %
Eosinophils Absolute: 0 10*3/uL (ref 0.0–0.5)
Eosinophils Relative: 0 %
HCT: 25.3 % — ABNORMAL LOW (ref 36.0–46.0)
Hemoglobin: 7.8 g/dL — ABNORMAL LOW (ref 12.0–15.0)
Immature Granulocytes: 6 %
Lymphocytes Relative: 17 %
Lymphs Abs: 1.8 10*3/uL (ref 0.7–4.0)
MCH: 30.5 pg (ref 26.0–34.0)
MCHC: 30.8 g/dL (ref 30.0–36.0)
MCV: 98.8 fL (ref 80.0–100.0)
Monocytes Absolute: 0.8 10*3/uL (ref 0.1–1.0)
Monocytes Relative: 8 %
Neutro Abs: 7.4 10*3/uL (ref 1.7–7.7)
Neutrophils Relative %: 68 %
Platelets: 213 10*3/uL (ref 150–400)
RBC: 2.56 MIL/uL — ABNORMAL LOW (ref 3.87–5.11)
RDW: 16.9 % — ABNORMAL HIGH (ref 11.5–15.5)
WBC: 10.8 10*3/uL — ABNORMAL HIGH (ref 4.0–10.5)
nRBC: 0.9 % — ABNORMAL HIGH (ref 0.0–0.2)

## 2021-06-08 LAB — COMPREHENSIVE METABOLIC PANEL
ALT: 24 U/L (ref 0–44)
AST: 21 U/L (ref 15–41)
Albumin: 2 g/dL — ABNORMAL LOW (ref 3.5–5.0)
Alkaline Phosphatase: 77 U/L (ref 38–126)
Anion gap: 5 (ref 5–15)
BUN: 17 mg/dL (ref 8–23)
CO2: 27 mmol/L (ref 22–32)
Calcium: 8.5 mg/dL — ABNORMAL LOW (ref 8.9–10.3)
Chloride: 103 mmol/L (ref 98–111)
Creatinine, Ser: 0.76 mg/dL (ref 0.44–1.00)
GFR, Estimated: >60 mL/min (ref >60)
Glucose, Bld: 145 mg/dL — ABNORMAL HIGH (ref 70–99)
Potassium: 4.3 mmol/L (ref 3.5–5.1)
Sodium: 135 mmol/L (ref 135–145)
Total Bilirubin: <0.1 mg/dL — ABNORMAL LOW (ref 0.3–1.2)
Total Protein: 4.4 g/dL — ABNORMAL LOW (ref 6.5–8.1)

## 2021-06-08 LAB — PHOSPHORUS: Phosphorus: 3.1 mg/dL (ref 2.5–4.6)

## 2021-06-08 LAB — GLUCOSE, CAPILLARY
Glucose-Capillary: 131 mg/dL — ABNORMAL HIGH (ref 70–99)
Glucose-Capillary: 128 mg/dL — ABNORMAL HIGH (ref 70–99)
Glucose-Capillary: 238 mg/dL — ABNORMAL HIGH (ref 70–99)
Glucose-Capillary: 300 mg/dL — ABNORMAL HIGH (ref 70–99)
Glucose-Capillary: 152 mg/dL — ABNORMAL HIGH (ref 70–99)

## 2021-06-08 LAB — MAGNESIUM: Magnesium: 1.9 mg/dL (ref 1.7–2.4)

## 2021-06-08 MED ORDER — INSULIN GLARGINE 100 UNIT/ML ~~LOC~~ SOLN
20.0000 [IU] | Freq: Every day | SUBCUTANEOUS | Status: DC
  Administered 2021-06-08 – 2021-06-11 (×4): 20 [IU] via SUBCUTANEOUS
  Filled 2021-06-08 (×5): qty 0.2

## 2021-06-08 MED ORDER — ENOXAPARIN SODIUM 40 MG/0.4ML IJ SOSY
40.0000 mg | PREFILLED_SYRINGE | INTRAMUSCULAR | Status: DC
  Administered 2021-06-08 – 2021-06-11 (×4): 40 mg via SUBCUTANEOUS
  Filled 2021-06-08 (×4): qty 0.4

## 2021-06-08 MED ORDER — INSULIN ASPART 100 UNIT/ML IJ SOLN
0.0000 [IU] | Freq: Three times a day (TID) | INTRAMUSCULAR | Status: DC
  Administered 2021-06-08: 7 [IU] via SUBCUTANEOUS
  Administered 2021-06-08: 11 [IU] via SUBCUTANEOUS
  Administered 2021-06-09: 7 [IU] via SUBCUTANEOUS
  Administered 2021-06-10: 4 [IU] via SUBCUTANEOUS
  Administered 2021-06-11: 3 [IU] via SUBCUTANEOUS

## 2021-06-08 NOTE — Progress Notes (Addendum)
PROGRESS NOTE    [name]  YNW:[id number] DOB: 1951-08-23 DOA: 05/26/2021 PCP: Ollen Bowl, MD    Brief Narrative:  [name] was admitted to the hospital with the working diagnosis of perforated diverticulitis complicated with intra-abdominal abscess, pulmonary embolism and acute blood loss anemia (lower GI bleed).     70 year old female past medical history for diastolic heart failure, hypertension, type 2 diabetes mellitus, and obstructive sleep apnea who presented with abdominal pain, nausea, vomiting and diarrhea for 2 weeks.  Patient was referred to the ED by her primary care provider.  On her initial physical examination she was hypotensive that improved with IV fluids to 110/82, heart rate 95, respiratory rate 22, oxygen saturation 96%, her lungs were clear to auscultation, heart S1-S2, present, rhythmic, soft tender to palpation, no rebound or guarding, no lower extremity edema.   Sodium 134, potassium 2.8, chloride 98, bicarb 24, glucose 287, BUN 23, creatinine 1.28, white count 8.2, hemoglobin 13.1, hematocrit 41.0, platelets clumped.   CT of the abdomen pelvis with perforated diverticulitis involving the mid sigmoid colon with a large 9.2x5.8 complex abscess anterior to the uterus and in between distal small bowel loops.  Bilateral central and lower lobe pulmonary emboli.   Chest radiograph no infiltrates.   EKG 99 bpm, left axis deviation, left anterior fascicular block, normal intervals, sinus rhythm with PACs, no significant ST segment or T wave changes.   Patient was placed on broad spectrum antibiotic therapy and surgery/ ID were consulted. Patient placed on TPN for nutrition and anticoagulated for pulmonary embolism.   Positive GI bleeding that obligated discontinuation of anticoagulation. IVC filter was placed. Follow up abdomen and pelvis CT with worsening abscess.   06/21: 10 Fr drainage catheter placed into the diverticular abscess per IR    Diet was  advanced with good toleration, TPN was discontinued.  Plan to continue antibiotic therapy with Zosyn and have follow up CT scan in few days.   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 (no sepsis) No abdominal pain and tolerating po with no nausea or vomiting.  Abscess drain culture positive for gram positive cocci in pairs pending final result.  Wbc is down to 10,8   Continue antibiotic therapy with Zosyn # 12  Tolerating po well, TPN has been discontinued. Continue to encourage out of bed to the chair tid with meals.    2. Acute bilateral pulmonary embolism/ acute hypoxemic respiratory failure. acute deep vein thrombosis involving the left popliteal vein and left posterior tibial arteries. Sp IVC filter. sp PRBC transfusion  Her cell count today with Hgb at 7,8 and Hct at 25.3 Will resume prophylactic anticoagulation for now with enoxaparin and have close follow up for signs of bleeding.  When no signs of further bleeding and Hgb and Hct stable will plan to upgrade to full anticoagulation and possible removal of IVC filter.    3. Acute blood loss anemia, due to GI bleeding (lower).  Sp PRBC 1 unit PRBC transfusion Upper endoscopy with normal esophagus and no gastric or duodenal ulcers on 06/01/21/    Hgb this am is 7.8, continue close follow up of cell count.  Will start patient on enoxaparin prophylactic dose.  4. Uncontrolled T2DM Hyper and hypoglycemia. Patient has been on 20 units of insulin through TPN over last 24 hrs with appropriate glucose control.  Will plan to transition to sq insulin 20 units tonight and continue sliding scale for glucose  cover and monitoring.  Patient is tolerating po well.    5. CKD stage 3a  stable renal function and electrolytes, plan to dc TPN in am. Avoid nephrotoxic medications or hypotension    6. Heart failure with diastolic dysfunction No signs of acute  decompensation   7. Hyperthyroidism. tang levothyroxine and continue with steroid taper.  8. Seizures. No clinical seizures.    Patient continue to be at high risk for worsening bleeding   Status is: Inpatient  Remains inpatient appropriate because:Inpatient level of care appropriate due to severity of illness  Dispo: The patient is from: Home              Anticipated d/c is to: CIR              Patient currently is not medically stable to d/c.   Difficult to place patient No   DVT prophylaxis: Enoxaparin   Code Status:   full  Family Communication:  I spoke with patient's daughters at the bedside, we talked in detail about patient's condition, plan of care and prognosis and all questions were addressed.   Consultants:  Surgery  ID  GI   Procedures:  Upper endoscopy Abdominal abscess drain   Antimicrobials:  Zosyn     Subjective: Patient is feeling better, no nausea or vomiting, she is tolerating well soft diet. No dyspnea or chest pain   Objective: Vitals:   06/07/21 2334 06/08/21 0323 06/08/21 0748 06/08/21 1224  BP: 120/83 118/75 113/67 108/82  Pulse:  69 75 88  Resp:   15 15  Temp: 98.1 F (36.7 C) 97.7 F (36.5 C) 97.9 F (36.6 C) 98 F (36.7 C)  TempSrc: Oral Oral    SpO2:  96% 99%   Weight:      Height:        Intake/Output Summary (Last 24 hours) at 06/08/2021 1425 Last data filed at 06/08/2021 0175 Gross per 24 hour  Intake 1293.95 ml  Output 1025 ml  Net 268.95 ml   Filed Weights   05/27/21 0041  Weight: 109.1 kg    Examination:   General: Not in pain or dyspnea, deconditioned  Neurology: Awake and alert, non focal  E ENT: mild pallor, no icterus, oral mucosa moist Cardiovascular: No JVD.  S1-S2 present, rhythmic, no gallops, rubs, or murmurs. No lower extremity edema. Pulmonary: positive breath sounds bilaterally, adequate air movement, no wheezing, rhonchi or rales. Gastrointestinal. Abdomen drain in place, it is soft and non tender Skin. No rashes Musculoskeletal: no joint deformities     Data Reviewed: I have personally reviewed following labs and imaging studies  CBC: Recent Labs  Lab 06/02/21 1750 06/03/21 0312 06/03/21 1211 06/03/21 1226 06/03/21 2013 06/04/21 0400 06/05/21 0512 06/08/21 0459  WBC 17.9* 16.3*  --  15.5*  --   --  14.8* 10.8*  NEUTROABS  --   --   --  13.1*  --   --  10.6* 7.4  HGB 8.0* 7.2*   < > 7.3* 7.2* 6.6* 8.5* 7.8*  HCT 24.6* 21.7*   < > 22.4* 22.1* 20.7* 26.1* 25.3*  MCV 94.6 95.6  --  96.1  --   --  94.9 98.8  PLT 144* 157  --  176  --   --  209 213   < > = values in this interval not displayed.   Basic Metabolic Panel: Recent Labs  Lab 06/04/21 0400 06/05/21 0500 06/06/21 0230 06/07/21 0350 06/08/21 0459  NA 136 136 138 138 135  K 3.8 4.0 3.9 4.2 4.3  CL 102 99 99 100 103  CO2 29 30 27 30 27   GLUCOSE 123* 143* 123* 136* 145*  BUN 10 9 10 18 17   CREATININE 0.73 0.66 0.65 0.78 0.76  CALCIUM 8.4* 8.5* 8.9 8.9 8.5*  MG 1.6* 1.9 1.8 1.9 1.9  PHOS 2.3* 3.1 3.1 2.8 3.1   GFR: Estimated Creatinine Clearance: 76.1 mL/min (by C-G formula based on SCr of 0.76 mg/dL). Liver Function Tests: Recent Labs  Lab 06/03/21 0845 06/05/21 0500 06/06/21 0230 06/08/21 0459  AST 14* 24 24 21   ALT 19 25 28 24   ALKPHOS 94 93 93 77  BILITOT 0.6 0.5 0.6 <0.1*  PROT 4.6* 4.4* 4.5* 4.4*  ALBUMIN 1.9* 2.0* 2.0* 2.0*   No results for input(s): LIPASE, AMYLASE in the last 168 hours. No results for input(s): AMMONIA in the last 168 hours. Coagulation Profile: Recent Labs  Lab 06/06/21 0230  INR 1.1   Cardiac Enzymes: No results for input(s): CKTOTAL, CKMB, CKMBINDEX, TROPONINI in the last 168 hours. BNP (last 3 results) No  results for input(s): PROBNP in the last 8760 hours. HbA1C: No results for input(s): HGBA1C in the last 72 hours. CBG: Recent Labs  Lab 06/07/21 2019 06/07/21 2334 06/08/21 0337 06/08/21 0742 06/08/21 1221  GLUCAP 233* 161* 131* 128* 238*   Lipid Profile: No results for input(s): CHOL, HDL, LDLCALC, TRIG, CHOLHDL, LDLDIRECT in the last 72 hours. Thyroid Function Tests: No results for input(s): TSH, T4TOTAL, FREET4, T3FREE, THYROIDAB in the last 72 hours. Anemia Panel: No results for input(s): VITAMINB12, FOLATE, FERRITIN, TIBC, IRON, RETICCTPCT in the last 72 hours.    Radiology Studies: I have reviewed all of the imaging during this hospital visit personally     Scheduled Meds:  Chlorhexidine Gluconate Cloth  6 each Topical Daily   feeding supplement  237 mL Oral BID BM   insulin aspart  0-20 Units Subcutaneous TID WC   insulin glargine  20 Units Subcutaneous QHS   levothyroxine  112 mcg Oral Once per day on Mon Tue Wed Thu Fri Sat   And   levothyroxine  56 mcg Oral Once per day on Sun   methylPREDNISolone (SOLU-MEDROL) injection  16 mg Intravenous Daily   Followed by   Fri ON 06/11/2021] methylPREDNISolone (SOLU-MEDROL) injection  8 mg Intravenous Daily   pantoprazole  40 mg Oral BID   sodium chloride flush  10-40 mL Intracatheter Q12H   sodium chloride flush  5 mL Intracatheter Q8H   Continuous Infusions:  sodium chloride     sodium chloride     piperacillin-tazobactam (ZOSYN)  IV 3.375 g (06/08/21 1309)   TPN ADULT (ION) 45 mL/hr at 06/07/21 1746     LOS: 13 days        Athan Casalino Melene Muller, MD

## 2021-06-08 NOTE — Progress Notes (Signed)
PHARMACY - TOTAL PARENTERAL NUTRITION CONSULT NOTE   Indication:  Intolerance to enteral feeding , diverticulitis w/ intra-abdominal abscess  Patient Measurements: Height: 5\' 2"  (157.5 cm) Weight: 109.1 kg (240 lb 8.4 oz) IBW/kg (Calculated) : 50.1 TPN AdjBW (KG): 64.9 Body mass index is 43.99 kg/m.  Assessment: 70 years of age female with known celiac disease, CHF, HTN, and DM admitted on 05/26/21 with perforated sigmoid diverticulitis and abscess as well as pulmonary embolism. IR unable to find a safe window for abscess drain placement and General Surgery recommending no surgery at this time. Attempted full liquids but had increased abdominal pain and loose stools so backed back down to clear liquids. Currently having bowel movements. Day #6 of hospital stay without adequate intake and patient has had N/V/D for 2 weeks prior to admission.   Daughter reports abdominal pain starting around 2 weeks ago which led to decreased oral intake which has gotten progressively worse leading up to admission. Since abdominal pain started, maybe would eat around 2 times per day.  Daughter reports patient would eat healthy (vegetables, fish, eggs) prior to abdominal pain. Drinks Glucerna shakes throughout the day.  Reports ~30 pound weight loss over the past 6 months. Patient is at risk for refeeding.   Started on FLD 6/21- has been tolerating, denies nausea/vomiting or abdominal pain.  Per MD wean TPN to half today.  Glucose / Insulin: hx DM, CBGs <200. regular insulin adjusted to 20 units added to TPN on 6/22.  rSSI 31 units used. IV steroid taper.   Electrolytes: Na 135, K 4.3, Mg 1.9, Phos 3.1, CoCa 10.1 Renal: SCr <1, BUN 17 Hepatic: LFTs/Tbili wnl, TG 94, albumin 2, prealbumin 24.2 Intake / Output; MIVF: UOP 525 mL, Last BM 6/22, no MIVF GI Imaging:  6/10 CT A/P: perforated diverticulitis w/ large abscess  6/18 CT A/P: persistent and increasing abscess within lower abd/pelvis interposed between  multiple loops of inflamed small bowel precluding perc drain GI Surgeries / Procedures:  6/16 EGD: normal esophagus, erythematous mucosa, duodenitis, healed ulcer  Central access: Double lumen PICC 6/16 TPN start date: 6/16  Nutritional Goals (per RD recommendation on 05/29/21): kCal: 2000-2200, Protein: 110-120, Fluid: >= 2L Goal TPN 90 ml/hr will provide 115g AA, 63g ILE, and 297g CHO for a total of 2100 kcal/day  Current Nutrition:  Soft diet and TPN   Plan:  Reduce TPN to 25 mL/hr at 1700 for 1 hour, then stop and d/c TPN at 1800 Insulin regimen per MD Adjust Resistant SSI to TID w/ meals  Thank you for involving pharmacy in this patient's care.  05/31/21, PharmD PGY-1 Acute Care Pharmacy Resident 06/08/2021 10:18 AM  **Pharmacist phone directory can be found on [email or web address] listed under [facility]**

## 2021-06-08 NOTE — Progress Notes (Signed)
Physical Therapy Treatment Patient Details Name: [name] MRN: [id number] DOB: July 28, 1951 Today's Date: 06/08/2021    History of Present Illness 70 y.o. female admitted on 05/26/21 for sigmoid diverticulitis with perforation and abscess, IR consulted for surgery vs drain placement and had an unsucessful attempt on 05/28/21.  Bil PE started on IV heparin. S/p CT guided placement of a 10 Fr drainage catheter placement into the diverticular abscess within the midline of the lower abdomen/upper pelvis 6/21. PMH including anemia, CHF, DM, graves disease, HTN, obesity, stroke, seizure, and thyroid disease.    PT Comments    Pt continues to be disoriented to time and date along with specific hospital and situation. Pt needs targets to ambulate to in order to direct her as she has difficulty following commands to step laterally or L or R. Performed several sit <> stand reps along with seated exercises prior to gait bouts to facilitate improved lower extremity strength. Pt with poor hip muscle strength, compensating when trying to isolate the hip with exercises, and resulting in poor bil feet clearance during gait. Will continue to follow acutely. Current recommendations remain appropriate.    Follow Up Recommendations  CIR     Equipment Recommendations  Rolling walker with 5" wheels;Wheelchair (measurements PT);Wheelchair cushion (measurements PT);Hospital bed    Recommendations for Other Services       Precautions / Restrictions Precautions Precautions: Fall Precaution Comments: flexiseal, R gravity drain Restrictions Weight Bearing Restrictions: No    Mobility  Bed Mobility Overal bed mobility: Needs Assistance Bed Mobility: Supine to Sit     Supine to sit: Min assist;HOB elevated     General bed mobility comments: Extra time and repeated cues to manage legs off EOB, minA to complete leg management.    Transfers Overall transfer level: Needs assistance Equipment used:  Rolling walker (2 wheeled) Transfers: Sit to/from Stand Sit to Stand: Mod assist;Min assist;Max assist         General transfer comment: Sit <> stand 5x from EOB and 1x from recliner, progressing from needing maxA first rep > modA second rep > minA final reps. Repeated cues to push up from bed and pretend RW was not present to avoid pt from jumping hands to RW to pull up, success noted with fair carryover. Pt rocks 2-3x prior to transfer attempt to gain momentum.  Ambulation/Gait Ambulation/Gait assistance: Min assist Gait Distance (Feet): 20 Feet (x2 bouts of ~20 ft each bout) Assistive device: Rolling walker (2 wheeled) Gait Pattern/deviations: Decreased stride length;Trunk flexed;Step-through pattern;Shuffle Gait velocity: decreased Gait velocity interpretation: <1.31 ft/sec, indicative of household ambulator General Gait Details: Pt with trunk flexed, taking small slow steps. Pt with poor feet clearance, cues to correct but min success. Needs cues to remain RW proximal to body when stepping posteriorly. Cues to sequence turns. Needs targets to ambulate to in order to direct her.   Stairs             Wheelchair Mobility    Modified Rankin (Stroke Patients Only)       Balance Overall balance assessment: Needs assistance Sitting-balance support: No upper extremity supported;Feet supported Sitting balance-Leahy Scale: Fair     Standing balance support: Bilateral upper extremity supported;During functional activity Standing balance-Leahy Scale: Poor Standing balance comment: reliant on support in standing.                            Cognition Arousal/Alertness: Awake/alert Behavior During Therapy: WFL for  tasks assessed/performed Overall Cognitive Status: Impaired/Different from baseline Area of Impairment: Memory;Following commands;Safety/judgement;Problem solving;Orientation;Attention                 Orientation Level: Disoriented  to;Time;Place;Situation Current Attention Level: Sustained Memory: Decreased short-term memory Following Commands: Follows one step commands with increased time;Follows one step commands consistently;Follows multi-step commands inconsistently Safety/Judgement: Decreased awareness of safety Awareness: Intellectual Problem Solving: Difficulty sequencing;Requires verbal cues;Requires tactile cues;Slow processing General Comments: Pt disoriented to time and date, stating it was early morning in July. Pt unsure which hospital she was at and the situation. Pt with moments of slow processing and needing repeated single step cues. Poor sequencing, needing continual directions with mobility.      Exercises General Exercises - Lower Extremity Long Arc Quad: Both;10 reps;Seated Hip Flexion/Marching: Both;10 reps;Seated (pt compensates) Toe Raises: Both;15 reps;Seated Heel Raises: Both;15 reps;Seated Other Exercises Other Exercises: Sit <> stand 5x from EOB    General Comments        Pertinent Vitals/Pain Pain Assessment: No/denies pain    Home Living                      Prior Function            PT Goals (current goals can now be found in the care plan section) Acute Rehab PT Goals Patient Stated Goal: to improve PT Goal Formulation: With patient Time For Goal Achievement: 06/13/21 Potential to Achieve Goals: Good Progress towards PT goals: Progressing toward goals    Frequency    Min 4X/week      PT Plan Current plan remains appropriate    Co-evaluation              AM-PAC PT "6 Clicks" Mobility   Outcome Measure  Help needed turning from your back to your side while in a flat bed without using bedrails?: A Little Help needed moving from lying on your back to sitting on the side of a flat bed without using bedrails?: A Little Help needed moving to and from a bed to a chair (including a wheelchair)?: A Little Help needed standing up from a chair using  your arms (e.g., wheelchair or bedside chair)?: A Lot Help needed to walk in hospital room?: A Little Help needed climbing 3-5 steps with a railing? : Total 6 Click Score: 15    End of Session Equipment Utilized During Treatment: Gait belt Activity Tolerance: Patient tolerated treatment well Patient left: with call bell/phone within reach;in chair;with chair alarm set   PT Visit Diagnosis: Muscle weakness (generalized) (M62.81);Difficulty in walking, not elsewhere classified (R26.2);Unsteadiness on feet (R26.81);Other abnormalities of gait and mobility (R26.89)     Time: 0109-3235 PT Time Calculation (min) (ACUTE ONLY): 25 min  Charges:  $Gait Training: 8-22 mins $Therapeutic Exercise: 8-22 mins                     Raymond Gurney, PT, DPT Acute Rehabilitation Services  Pager: [phone] Office: [phone]    Jewel Baize 06/08/2021, 12:11 PM

## 2021-06-08 NOTE — Progress Notes (Signed)
Nutrition Follow-up  DOCUMENTATION CODES:   Obesity unspecified  INTERVENTION:   - Hx of Celiac Disease; pt will require gluten-free diet once diet advanced  - Please obtain updated weight  - Ensure Enlive po BID, each supplement provides 350 kcal and 20 grams of protein  - Pt with Glucerna Shakes from home in room and can consume these as desired, each supplement provides 220 kcal and 10 grams of protein  - Encourage PO intake  NUTRITION DIAGNOSIS:   Inadequate oral intake related to altered GI function, acute illness as evidenced by NPO status.  Ongoing, being addressed via diet advancement and oral nutrition supplements  GOAL:   Patient will meet greater than or equal to 90% of their needs  Progressing  MONITOR:   PO intake, Supplement acceptance, Diet advancement, Labs, Weight trends, I & O's  REASON FOR ASSESSMENT:   Consult New TPN/TNA  ASSESSMENT:   70 year old female admitted with abdominal pain, N/V/D x 2 weeks with perforated sigmoid diverticulitis with abscess, also with PE. PMH includes CHF, HTN, DM, celiac disease.  6/12 - clear liquids 6/13 - full liquids 6/15 - NPO 6/16 - s/p EGD without evidence of active bleeding, s/p IVC filter placement by IR, PICC placed, TPN started @ 35 ml/hr 6/17 - clear liquids 6/20 - TPN advanced to 45 ml/hr 6/21 - NPO, s/p IR aspiration of intra-abdominal abscess and drain placement, TPN advanced to 65 ml/hr, clear liquids, later full liquids 6/22 - TPN weaned to 45 ml/hr, GI soft diet  Plan is to wean TPN today and stop at 1800. Pt on a GI soft diet and tolerating without N/V or abdominal discomfort.  Spoke with pt and daughter [name] at bedside. [name] reports pt consumed ~75% of dinner meal yesterday and ~75% of lunch meal today. [name] states that on Tuesday pt consumed 2 Ensure supplements. Pt with Glucerna in room from home. Encouraged pt to consume Ensure to optimize kcal and protein intake and pt is willing to do  so.  No new weight since admission. Recommend obtaining updated weight.  Pt with mild pitting generalized edema and moderate pitting edema to BLE per nursing documentation.  Meal Completion: 75% x 1 meal on 6/22  Medications reviewed and include: Ensure Enlive BID, SSI q 4 hours, levothyroxine, IV solu-medrol taper, protonix, TPN  Labs reviewed: hemoglobin 7.8 CBG's: 128-233 x 24 hours  UOP: 300 ml x 12 hours Abdominal drain: 25 ml x 24 hours Stool: 200 ml via rectal tube + 2 unmeasured occurrences x 24 hours I/O's: +3.9 L since admit  Diet Order:   Diet Order             DIET SOFT Room service appropriate? Yes; Fluid consistency: Thin  Diet effective now                   EDUCATION NEEDS:   Education needs have been addressed  Skin:  Skin Assessment: Skin Integrity Issues: Other: MASD to perineal region  Last BM:  06/08/21 rectal tube  Height:   Ht Readings from Last 1 Encounters:  05/27/21 5\' 2"  (1.575 m)    Weight:   Wt Readings from Last 1 Encounters:  05/27/21 109.1 kg    BMI:  Body mass index is 43.99 kg/m.  Estimated Nutritional Needs:   Kcal:  2000-2200 kcals  Protein:  110-120 g  Fluid:  >/= 2 L    07/27/21, MS, RD, LDN Inpatient Clinical Dietitian Please see AMiON for contact information.

## 2021-06-08 NOTE — Progress Notes (Signed)
[facility]  Date of Admission:  05/26/2021      Total days of antibiotics 13   Zosyn 6/11 >> current          ASSESSMENT: [name] is a 70 y.o. female with intraabdominal abscess following perforated diverticula. Increased in size on recent CT scan now s/p percutaneous drain placement. 180cc purulent drainage removed and specimens sent to micro and pending any data. Initially culture report this morning showed enterococcus and an unidentified GNR but now culture report has been updated to just reflect "culture reincubated." Will follow and discuss with micro tomorrow after it updates.   Would recommend repeating CT scan in a few days to re-assess drainage and see if we could potentially convert to oral therapy at that point. D/W surgery team at the bedside.   TPN has been weaning with goals to D/C given she is tolerating PO diet better. Please keep PICC in place until we know more about micro and repeat CT scan of abscess progress until we can determine she is a good PO candidate for therapy.   Plan for discharge to CIR once medically ready.    PLAN: Continue zosyn  Follow micro collected from drainage  Would repeat CT scan in a few days to ensure adequate enough drainage and safe to convert to PO Please keep PICC for now pending final abx recommendations.    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 (HCC)   Moderate persistent asthma, uncomplicated   Seizure (HCC)   Bilateral pulmonary embolism (HCC)    Chlorhexidine Gluconate Cloth  6 each Topical Daily   feeding supplement  237 mL Oral BID BM   insulin aspart  0-20 Units Subcutaneous TID WC   levothyroxine  112 mcg Oral Once per day on Mon Tue Wed Thu Fri Sat   And   levothyroxine  56 mcg Oral Once per day on Sun   methylPREDNISolone  (SOLU-MEDROL) injection  16 mg Intravenous Daily   Followed by   Melene Muller ON 06/11/2021] methylPREDNISolone (SOLU-MEDROL) injection  8 mg Intravenous Daily   pantoprazole  40 mg Oral BID   sodium chloride flush  10-40 mL Intracatheter Q12H   sodium chloride flush  5 mL Intracatheter Q8H    SUBJECTIVE: She is happy she tolerated "real food" again without any abdominal pain or nausea.   Review of Systems: Review of Systems  Constitutional:  Negative for chills, fever and malaise/fatigue.  HENT:  Negative for sore throat.   Respiratory:  Negative for cough.   Cardiovascular:  Negative for chest pain.  Gastrointestinal:  Negative for abdominal pain, diarrhea, nausea and vomiting.  Genitourinary:  Negative for dysuria.  Skin:  Negative for rash.  Neurological:  Negative for dizziness.     Allergies  Allergen Reactions   Gluten Meal     Celiac    OBJECTIVE: Vitals:   06/07/21 2334 06/08/21 0323 06/08/21 0748 06/08/21 1224  BP: 120/83 118/75 113/67 108/82  Pulse:  69 75 88  Resp:   15 15  Temp: 98.1 F (36.7 C) 97.7 F (36.5 C) 97.9 F (36.6 C) 98 F (36.7 C)  TempSrc: Oral Oral    SpO2:  96% 99%   Weight:      Height:       Body mass index is 43.99 kg/m.  Physical Exam  Constitutional:      Appearance: She is well-developed.     Comments: Resting comfortably in bed with family present.   Cardiovascular:     Rate and Rhythm: Normal rate and regular rhythm.  Pulmonary:     Effort: Pulmonary effort is normal.     Breath sounds: Normal breath sounds.  Abdominal:     General: There is no distension.     Palpations: Abdomen is soft.     Comments: JP drain in place with tan material in bulb   Skin:    General: Skin is warm and dry.     Capillary Refill: Capillary refill takes less than 2 seconds.  Neurological:     Mental Status: She is alert and oriented to person, place, and time.    Lab Results Lab Results  Component Value Date   WBC 10.8 (H) 06/08/2021    HGB 7.8 (L) 06/08/2021   HCT 25.3 (L) 06/08/2021   MCV 98.8 06/08/2021   PLT 213 06/08/2021    Lab Results  Component Value Date   CREATININE 0.76 06/08/2021   BUN 17 06/08/2021   NA 135 06/08/2021   K 4.3 06/08/2021   CL 103 06/08/2021   CO2 27 06/08/2021    Lab Results  Component Value Date   ALT 24 06/08/2021   AST 21 06/08/2021   ALKPHOS 77 06/08/2021   BILITOT <0.1 (L) 06/08/2021     Microbiology: Recent Results (from the past 240 hour(s))  C Difficile Quick Screen w PCR reflex     Status: None   Collection Time: 05/30/21  4:57 PM   Specimen: Per Rectum; Stool  Result Value Ref Range Status   C Diff antigen NEGATIVE NEGATIVE Final   C Diff toxin NEGATIVE NEGATIVE Final   C Diff interpretation No C. difficile detected.  Final    Comment: Performed at [facility], [address], [place], [place] [address]  Aerobic/Anaerobic Culture w Gram Stain (surgical/deep wound)     Status: None (Preliminary result)   Collection Time: 06/06/21 10:36 AM   Specimen: Abscess  Result Value Ref Range Status   Specimen Description ABSCESS  Final   Special Requests DRAIN  Final   Gram Stain   Final    ABUNDANT WBC PRESENT, PREDOMINANTLY PMN RARE GRAM POSITIVE COCCI IN PAIRS    Culture   Final    CULTURE REINCUBATED FOR BETTER GROWTH Performed at [facility], [address] [place], [place] [address]    Report Status PENDING  Incomplete    Rexene Alberts, MSN, NP-C [facility] Cell: [phone] Pager: [phone]  06/08/2021  12:49 PM

## 2021-06-08 NOTE — Progress Notes (Signed)
  Referring Physician(s): White, C.   Supervising Physician: Hassell, Daniel  Patient Status:  [facility] - In-pt  Chief Complaint:  Intra-abd/pelvic abscess s/p drain placement with IR on 6/21  Subjective:  Pt laying in bed, not in acute distress. Her daughter at the beside.  Pt states that she is doing well, denies abdominal pain, N/V, no complaints regarding the drain.  States that ID came by and discussed f/u CT in next couple days.    Allergies: Gluten meal  Medications: Prior to Admission medications   Medication Sig Start Date End Date Taking? Authorizing Provider  albuterol (VENTOLIN HFA) 108 (90 Base) MCG/ACT inhaler Inhale 2 puffs into the lungs every 6 (six) hours as needed for wheezing or shortness of breath.   Yes [provider]  amLODipine (NORVASC) 5 MG tablet Take 1 tablet (5 mg total) by mouth daily. 10/04/20  Yes Nahser, Philip J, MD  carvedilol (COREG) 25 MG tablet Take 1 tablet (25 mg total) by mouth 2 (two) times daily with a meal. 12/05/12  Yes Nahser, Philip J, MD  esomeprazole (NEXIUM) 20 MG capsule Take 1 capsule by mouth daily.   Yes [provider]  furosemide (LASIX) 20 MG tablet Take 1 tablet (20 mg total) by mouth daily. 05/22/21  Yes White, Adrienne R, NP  levothyroxine (SYNTHROID) 112 MCG tablet Take 1 tablet by mouth Monday through Saturday and 1/2 tablet on Sunday. 11/08/20  Yes Kumar, Ajay, MD  metFORMIN (GLUCOPHAGE) 500 MG tablet Take 500 mg by mouth 2 (two) times daily. 08/23/20  Yes [provider]  OVER THE COUNTER MEDICATION Take 1 tablet by mouth daily. Medication: Vitamin b12 3000 mcg   Yes [provider]  potassium chloride (KLOR-CON) 10 MEQ tablet Take 2 tablets (20 mEq total) by mouth daily. 11/07/20  Yes Nahser, Philip J, MD  predniSONE (DELTASONE) 20 MG tablet Take 60 mg by mouth daily with breakfast.   Yes [provider]  rosuvastatin (CRESTOR) 5 MG tablet Take 1 tablet (5 mg total) by mouth  daily. 11/08/20  Yes Nahser, Philip J, MD  Blood Glucose Monitoring Suppl (ONETOUCH VERIO REFLECT) w/Device KIT 1 each by Other route as directed. 08/10/20   [provider]  Fluticasone-Salmeterol (ADVAIR) 250-50 MCG/DOSE AEPB Inhale 1 puff into the lungs daily as needed for allergies.    [provider]  glucose blood (ONETOUCH VERIO) test strip Use as intstructed to check blood sugar once a day Dx Code E11.9 02/18/21   Kumar, Ajay, MD  Lancets (ONETOUCH ULTRASOFT) lancets Use as instructed 10/13/20   Kumar, Ajay, MD  meclizine (ANTIVERT) 25 MG tablet Take 1 tablet by mouth daily as needed for dizziness.    [provider]     Vital Signs: BP 113/67 (BP Location: Left Arm)   Pulse 75   Temp 97.9 F (36.6 C)   Resp 15   Ht 5' 2" (1.575 m)   Wt 240 lb 8.4 oz (109.1 kg)   SpO2 99%   BMI 43.99 kg/m   Physical Exam Vitals reviewed.  Constitutional:      General: She is not in acute distress.    Appearance: She is well-developed.  HENT:     Head: Normocephalic and atraumatic.  Cardiovascular:     Rate and Rhythm: Normal rate.  Pulmonary:     Effort: Pulmonary effort is normal.  Abdominal:     Palpations: Abdomen is soft.  Skin:    General: Skin is warm and dry.       Coloration: Skin is not jaundiced.     Comments: Positive left-mid lower abdomen drain to a gravity bag. Site is unremarkable with no erythema, edema, tenderness, bleeding or drainage. Suture and stat lock in place. Dressing is clean, dry, and intact. 5 ml of  tan colored purulent fluid noted in the gravity. Drain aspirates and flushes well.    Neurological:     Mental Status: She is alert and oriented to person, place, and time.  Psychiatric:        Mood and Affect: Mood normal.        Behavior: Behavior normal.    Imaging: CT IMAGE GUIDED DRAINAGE BY PERCUTANEOUS CATHETER  Result Date: 06/06/2021 INDICATION: Diverticular abscess. Please perform image guided drainage catheter placement  for infection source control purposes. Note, patient was to undergo image guided drainage catheter placement on 05/28/2021 however the procedure was terminated secondary to lack of an adequate percutaneous window. EXAM: ULTRASOUND CT-GUIDED LOWER ABDOMINAL/UPPER PELVIC DIVERTICULAR ABSCESS DRAINAGE CATHETER PLACEMENT COMPARISON:  CT abdomen pelvis-06/03/2021; 05/26/2021 Aborted image guided drainage catheter placement-05/28/2021 MEDICATIONS: The patient is currently admitted to the hospital and receiving intravenous antibiotics. The antibiotics were administered within an appropriate time frame prior to the initiation of the procedure. ANESTHESIA/SEDATION: Moderate (conscious) sedation was employed during this procedure. A total of Versed 2 mg and Fentanyl 100 mcg was administered intravenously. Moderate Sedation Time: 21 minutes. The patient's level of consciousness and vital signs were monitored continuously by radiology nursing throughout the procedure under my direct supervision. CONTRAST:  None COMPLICATIONS: None immediate. PROCEDURE: Informed written consent was obtained from the patient after a discussion of the risks, benefits and alternatives to treatment. The patient was placed supine on the CT gantry and a pre procedural CT was performed re-demonstrating the known abscess/fluid collection within the midline of the lower abdomen/pelvis with dominant ill-defined air in fluid containing component measuring at least 9.1 x 7.1 cm (image 13, series 2). The CT gantry table position was marked and the collection was identified sonographically. The procedure was planned. A timeout was performed prior to the initiation of the procedure. The skin overlying the ventral aspect of the lower abdomen/upper pelvis was prepped and draped in the usual sterile fashion. The overlying soft tissues were anesthetized with 1% lidocaine with epinephrine. Under direct ultrasound guidance, the complex fluid collection was accessed  with 18 gauge trocar needle. Multiple ultrasound images were saved procedural documentation purposes. A small amount of purulent fluid was aspirated and a short Amplatz wire was coiled within the collection. Appropriate positioning was confirmed with a limited CT scan. The tract was serially dilated allowing placement of a 10 [place] all-purpose drainage catheter. Appropriate positioning was confirmed with a limited postprocedural CT scan. Approximately 180 ml of purulent fluid was aspirated. The tube was connected to a drainage bag and sutured in place. A dressing was placed. The patient tolerated the procedure well without immediate post procedural complication. IMPRESSION: Successful ultrasound and CT guided placement of a 10 French all purpose drain catheter into the midline of the lower abdomen/upper pelvis with aspiration of 180 mL of purulent fluid. Samples were sent to the laboratory as requested by the ordering clinical team. Electronically Signed   By: Sandi Mariscal M.D.   On: 06/06/2021 14:30    Labs:  CBC: Recent Labs    06/03/21 0312 06/03/21 1211 06/03/21 1226 06/03/21 2013 06/04/21 0400 06/05/21 0512 06/08/21 0459  WBC 16.3*  --  15.5*  --   --  14.8* 10.8*  HGB 7.2*   < > 7.3* 7.2* 6.6* 8.5* 7.8*  HCT 21.7*   < > 22.4* 22.1* 20.7* 26.1* 25.3*  PLT 157  --  176  --   --  209 213   < > = values in this interval not displayed.    COAGS: Recent Labs    06/06/21 0230  INR 1.1    BMP: Recent Labs    11/07/20 1058 03/26/21 1927 06/05/21 0500 06/06/21 0230 06/07/21 0350 06/08/21 0459  NA 141   < > 136 138 138 135  K 4.1   < > 4.0 3.9 4.2 4.3  CL 103   < > 99 99 100 103  CO2 24   < > 30 27 30 27  GLUCOSE 118*   < > 143* 123* 136* 145*  BUN 13   < > 9 10 18 17  CALCIUM 9.9   < > 8.5* 8.9 8.9 8.5*  CREATININE 1.06*   < > 0.66 0.65 0.78 0.76  GFRNONAA 53*   < > >60 >60 >60 >60  GFRAA 61  --   --   --   --   --    < > = values in this interval not displayed.    LIVER  FUNCTION TESTS: Recent Labs    06/03/21 0845 06/05/21 0500 06/06/21 0230 06/08/21 0459  BILITOT 0.6 0.5 0.6 <0.1*  AST 14* 24 24 21  ALT 19 25 28 24  ALKPHOS 94 93 93 77  PROT 4.6* 4.4* 4.5* 4.4*  ALBUMIN 1.9* 2.0* 2.0* 2.0*    Assessment and Plan: 69 y.o. female with a perforated sigmoid diverticulitis with abscess; s/p drain placement with Dr. Watts on 6/21   Pt stable, drain intact, flushes and aspirates well. Puncture site unremarkable, no s/s of bleeding or infection.  OP 25 cc - tan colored, purulent  VSS WBC trending down, 10.8 today (14.8 yesterday) Cx few gram - rods   Continue with flushing TID, output recording q shift and dressing changes as needed. Would consider additional imaging when output is less than 10 ml for 24 hours not including flush material.    Further treatment plan per [facility] Appreciate and agree with the plan.  IR to follow.    Electronically Signed:  H , PA-C 06/08/2021, 9:31 AM   I spent a total of 15 Minutes at the the patient's bedside AND on the patient's hospital floor or unit, greater than 50% of which was counseling/coordinating care for left mid lower abdominal drain       

## 2021-06-08 NOTE — Progress Notes (Signed)
Progress Note  7 Days Post-Op  Subjective: CC: no complaints this am. Had soft diet for dinner and breakfast with good appetite, no nausea/emesis or abdominal pain.  Daughters are bedside  Objective: Vital signs in last 24 hours: Temp:  [97.6 F (36.4 C)-98.1 F (36.7 C)] 97.9 F (36.6 C) (06/23 0748) Pulse Rate:  [69-84] 75 (06/23 0748) Resp:  [15-17] 15 (06/23 0748) BP: (102-128)/(66-83) 113/67 (06/23 0748) SpO2:  [95 %-99 %] 99 % (06/23 0748) Last BM Date: 06/06/21  Intake/Output from previous day: 06/22 0701 - 06/23 0700 In: 2169 [P.O.:860; I.V.:1087.2; IV Piggyback:161.8] Out: 525 [Urine:300; Drains:25; Stool:200] Intake/Output this shift: Total I/O In: -  Out: 500 [Urine:500]  PE: General: pleasant, WD, female who is sitting up in bed in NAD HEENT: head is normocephalic, atraumatic. Mouth is pink and moist Heart:  Palpable radial pulses bilaterally Lungs: Respiratory effort nonlabored Abd: soft, NT, ND, +BS, drain with mix of milky purulent and yellow serous output MS: all 4 extremities are symmetrical with no cyanosis, clubbing, or edema. Skin: warm and dry with no masses, lesions, or rashes Psych: A&Ox3 with an appropriate affect.    Lab Results:  Recent Labs    06/08/21 0459  WBC 10.8*  HGB 7.8*  HCT 25.3*  PLT 213   BMET Recent Labs    06/07/21 0350 06/08/21 0459  NA 138 135  K 4.2 4.3  CL 100 103  CO2 30 27  GLUCOSE 136* 145*  BUN 18 17  CREATININE 0.78 0.76  CALCIUM 8.9 8.5*   PT/INR Recent Labs    06/06/21 0230  LABPROT 14.2  INR 1.1   CMP     Component Value Date/Time   NA 135 06/08/2021 0459   NA 141 11/07/2020 1058   K 4.3 06/08/2021 0459   CL 103 06/08/2021 0459   CO2 27 06/08/2021 0459   GLUCOSE 145 (H) 06/08/2021 0459   BUN 17 06/08/2021 0459   BUN 13 11/07/2020 1058   CREATININE 0.76 06/08/2021 0459   CALCIUM 8.5 (L) 06/08/2021 0459   PROT 4.4 (L) 06/08/2021 0459   PROT 6.5 11/07/2020 1058   ALBUMIN 2.0 (L)  06/08/2021 0459   ALBUMIN 4.3 11/07/2020 1058   AST 21 06/08/2021 0459   ALT 24 06/08/2021 0459   ALKPHOS 77 06/08/2021 0459   BILITOT <0.1 (L) 06/08/2021 0459   BILITOT 0.6 11/07/2020 1058   GFRNONAA >60 06/08/2021 0459   GFRAA 61 11/07/2020 1058   Lipase     Component Value Date/Time   LIPASE 19 05/26/2021 1601       Studies/Results: CT IMAGE GUIDED DRAINAGE BY PERCUTANEOUS CATHETER  Result Date: 06/06/2021 INDICATION: Diverticular abscess. Please perform image guided drainage catheter placement for infection source control purposes. Note, patient was to undergo image guided drainage catheter placement on 05/28/2021 however the procedure was terminated secondary to lack of an adequate percutaneous window. EXAM: ULTRASOUND CT-GUIDED LOWER ABDOMINAL/UPPER PELVIC DIVERTICULAR ABSCESS DRAINAGE CATHETER PLACEMENT COMPARISON:  CT abdomen pelvis-06/03/2021; 05/26/2021 Aborted image guided drainage catheter placement-05/28/2021 MEDICATIONS: The patient is currently admitted to the hospital and receiving intravenous antibiotics. The antibiotics were administered within an appropriate time frame prior to the initiation of the procedure. ANESTHESIA/SEDATION: Moderate (conscious) sedation was employed during this procedure. A total of Versed 2 mg and Fentanyl 100 mcg was administered intravenously. Moderate Sedation Time: 21 minutes. The patient's level of consciousness and vital signs were monitored continuously by radiology nursing throughout the procedure under my direct supervision. CONTRAST:  None  COMPLICATIONS: None immediate. PROCEDURE: Informed written consent was obtained from the patient after a discussion of the risks, benefits and alternatives to treatment. The patient was placed supine on the CT gantry and a pre procedural CT was performed re-demonstrating the known abscess/fluid collection within the midline of the lower abdomen/pelvis with dominant ill-defined air in fluid containing  component measuring at least 9.1 x 7.1 cm (image 13, series 2). The CT gantry table position was marked and the collection was identified sonographically. The procedure was planned. A timeout was performed prior to the initiation of the procedure. The skin overlying the ventral aspect of the lower abdomen/upper pelvis was prepped and draped in the usual sterile fashion. The overlying soft tissues were anesthetized with 1% lidocaine with epinephrine. Under direct ultrasound guidance, the complex fluid collection was accessed with 18 gauge trocar needle. Multiple ultrasound images were saved procedural documentation purposes. A small amount of purulent fluid was aspirated and a short Amplatz wire was coiled within the collection. Appropriate positioning was confirmed with a limited CT scan. The tract was serially dilated allowing placement of a 10 [place] all-purpose drainage catheter. Appropriate positioning was confirmed with a limited postprocedural CT scan. Approximately 180 ml of purulent fluid was aspirated. The tube was connected to a drainage bag and sutured in place. A dressing was placed. The patient tolerated the procedure well without immediate post procedural complication. IMPRESSION: Successful ultrasound and CT guided placement of a 10 French all purpose drain catheter into the midline of the lower abdomen/upper pelvis with aspiration of 180 mL of purulent fluid. Samples were sent to the laboratory as requested by the ordering clinical team. Electronically Signed   By: Simonne Come M.D.   On: 06/06/2021 14:30    Anti-infectives: Anti-infectives (From admission, onward)    Start     Dose/Rate Route Frequency Ordered Stop   05/27/21 0500  piperacillin-tazobactam (ZOSYN) IVPB 3.375 g        3.375 g 12.5 mL/hr over 240 Minutes Intravenous Every 8 hours 05/26/21 2126     05/26/21 2015  ceFEPIme (MAXIPIME) 2 g in sodium chloride 0.9 % 100 mL IVPB       See Hyperspace for full Linked Orders Report.    2 g 200 mL/hr over 30 Minutes Intravenous  Once 05/26/21 2004 05/26/21 2107   05/26/21 2015  metroNIDAZOLE (FLAGYL) IVPB 500 mg       See Hyperspace for full Linked Orders Report.   500 mg 100 mL/hr over 60 Minutes Intravenous  Once 05/26/21 2004 05/26/21 2135        Assessment/Plan  70F Diverticulitis with intra-abdominal abscess (9x5 cm) - afebrile, WBC downtrending; is on chronic steroids as well with wean attempts underway - Continue drain (25 mL out and becoming more serous) and IV abx -  culture positive for moderate enterococcus faecalis, few gram neg rods. Follow  - ID is following as well and briefly discussed patient with them this morning. Recheck CT in next 2-3 days with transition to PO pending abscess improvement - No emergent surgical needs at this juncture - OOB/mobilize with therapies    FEN: soft - wean TPN ID: Zosyn 6/10>> VTE: SCD's Foley: none, external cath Dispo: SDU   - Below per primary service - Bilateral PE-heparin IV held due to hgb requiring transfusion. S/p IVCF 6/16 by IR Melena - EGD 6/16 normal esophagus, erythematous mucosa in the antrum, duodenitis but no active bleeding. GI signed off. Acute respiratory failure with hypoxia Thombocytopenia Chronic diastolic  heart failure CKD IIIa HTN Graves dz DM2 Morbid obesity     LOS: 13 days    Eric Form, [facility] Surgery 06/08/2021, 10:00 AM Please see Amion for pager number during day hours 7:00am-4:30pm

## 2021-06-09 LAB — CBC WITH DIFFERENTIAL/PLATELET
Abs Immature Granulocytes: 0.32 10*3/uL — ABNORMAL HIGH (ref 0.00–0.07)
Basophils Absolute: 0.1 10*3/uL (ref 0.0–0.1)
Basophils Relative: 1 %
Eosinophils Absolute: 0 10*3/uL (ref 0.0–0.5)
Eosinophils Relative: 0 %
HCT: 27.2 % — ABNORMAL LOW (ref 36.0–46.0)
Hemoglobin: 8.6 g/dL — ABNORMAL LOW (ref 12.0–15.0)
Immature Granulocytes: 3 %
Lymphocytes Relative: 24 %
Lymphs Abs: 2.9 10*3/uL (ref 0.7–4.0)
MCH: 31 pg (ref 26.0–34.0)
MCHC: 31.6 g/dL (ref 30.0–36.0)
MCV: 98.2 fL (ref 80.0–100.0)
Monocytes Absolute: 1 10*3/uL (ref 0.1–1.0)
Monocytes Relative: 8 %
Neutro Abs: 7.8 10*3/uL — ABNORMAL HIGH (ref 1.7–7.7)
Neutrophils Relative %: 64 %
Platelets: 249 10*3/uL (ref 150–400)
RBC: 2.77 MIL/uL — ABNORMAL LOW (ref 3.87–5.11)
RDW: 17.1 % — ABNORMAL HIGH (ref 11.5–15.5)
WBC: 12.1 10*3/uL — ABNORMAL HIGH (ref 4.0–10.5)
nRBC: 0.7 % — ABNORMAL HIGH (ref 0.0–0.2)

## 2021-06-09 LAB — BASIC METABOLIC PANEL
Anion gap: 9 (ref 5–15)
BUN: 15 mg/dL (ref 8–23)
CO2: 29 mmol/L (ref 22–32)
Calcium: 9.1 mg/dL (ref 8.9–10.3)
Chloride: 99 mmol/L (ref 98–111)
Creatinine, Ser: 0.8 mg/dL (ref 0.44–1.00)
GFR, Estimated: >60 mL/min (ref >60)
Glucose, Bld: 76 mg/dL (ref 70–99)
Potassium: 3.9 mmol/L (ref 3.5–5.1)
Sodium: 137 mmol/L (ref 135–145)

## 2021-06-09 LAB — GLUCOSE, CAPILLARY
Glucose-Capillary: 65 mg/dL — ABNORMAL LOW (ref 70–99)
Glucose-Capillary: 148 mg/dL — ABNORMAL HIGH (ref 70–99)
Glucose-Capillary: 230 mg/dL — ABNORMAL HIGH (ref 70–99)
Glucose-Capillary: 139 mg/dL — ABNORMAL HIGH (ref 70–99)

## 2021-06-09 NOTE — Progress Notes (Signed)
[facility]  Date of Admission:  05/26/2021      Total days of antibiotics 15  Zosyn 6/11 >> current          ASSESSMENT: [name] is a 70 y.o. female with intraabdominal abscess following perforated diverticula. Increased in size on recent CT scan >> s/p percutaneous drain placement 6/21 yielding 180cc purulent drainage.   Drain with 25cc output over the last 24h. Probably repeat CT scan Monday unless IR recommends differently.   TPN has been weaning with goals to D/C given she is tolerating PO diet better. Please keep PICC in place until we know more about micro and repeat CT scan of abscess progress until we can determine she is a good PO candidate for therapy based on final cultures and repeat scan.   Agree with surgery - OK to go to CIR and we can continue care with her up there.   Cold intolerance - may be due to anemia but her daughter is also requesting TSH panel to be checked since. Will dever to primary team.    PLAN: Continue zosyn  Follow micro collected from drainage  CT scan recommended to be repeated Monday (unless IR team feels differently) Please keep PICC for now pending final abx recommendations.  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)   Moderate persistent asthma, uncomplicated   Seizure (HCC)   Bilateral pulmonary embolism (HCC)    Chlorhexidine Gluconate Cloth  6 each Topical Daily   enoxaparin (LOVENOX) injection  40 mg Subcutaneous Q24H   feeding supplement  237 mL Oral BID BM   insulin aspart  0-20 Units Subcutaneous TID WC   insulin glargine  20 Units Subcutaneous QHS   levothyroxine  112 mcg Oral Once per day on Mon Tue Wed Thu Fri Sat   And   levothyroxine  56 mcg Oral Once per day on Sun    methylPREDNISolone (SOLU-MEDROL) injection  16 mg Intravenous Daily   Followed by   Melene Muller ON 06/11/2021] methylPREDNISolone (SOLU-MEDROL) injection  8 mg Intravenous Daily   pantoprazole  40 mg Oral BID   sodium chloride flush  10-40 mL Intracatheter Q12H   sodium chloride flush  5 mL Intracatheter Q8H    SUBJECTIVE: She is pretty cold all the time. Tolerating diet well.     Review of Systems: Review of Systems  Constitutional:  Negative for chills, fever and malaise/fatigue.  HENT:  Negative for sore throat.   Respiratory:  Negative for cough.   Cardiovascular:  Negative for chest pain.  Gastrointestinal:  Negative for abdominal pain, diarrhea, nausea and vomiting.  Genitourinary:  Negative for dysuria.  Skin:  Negative for rash.  Neurological:  Negative for dizziness.     Allergies  Allergen Reactions   Gluten Meal     Celiac    OBJECTIVE: Vitals:   06/09/21 0000 06/09/21 0400 06/09/21 0815 06/09/21 1137  BP: 104/78 123/89 120/70 95/72  Pulse: 80 70 80 86  Resp: 16 16 16 16   Temp: 97.9 F (36.6 C) 98.6 F (37 C) 97.6 F (36.4 C) 97.6 F (36.4 C)  TempSrc: Oral Oral Oral Oral  SpO2: 98% 97% 98% 99%  Weight:      Height:       Body mass index  is 43.99 kg/m.  Physical Exam Constitutional:      Appearance: She is well-developed.     Comments: Resting comfortably in bed with family present.   Cardiovascular:     Rate and Rhythm: Normal rate and regular rhythm.  Pulmonary:     Effort: Pulmonary effort is normal.     Breath sounds: Normal breath sounds.  Abdominal:     General: There is no distension.     Palpations: Abdomen is soft.     Comments: JP drain in place with tan material in bulb   Skin:    General: Skin is warm and dry.     Capillary Refill: Capillary refill takes less than 2 seconds.  Neurological:     Mental Status: She is alert and oriented to person, place, and time.    Lab Results Lab Results  Component Value Date   WBC 12.1 (H)  06/09/2021   HGB 8.6 (L) 06/09/2021   HCT 27.2 (L) 06/09/2021   MCV 98.2 06/09/2021   PLT 249 06/09/2021    Lab Results  Component Value Date   CREATININE 0.80 06/09/2021   BUN 15 06/09/2021   NA 137 06/09/2021   K 3.9 06/09/2021   CL 99 06/09/2021   CO2 29 06/09/2021    Lab Results  Component Value Date   ALT 24 06/08/2021   AST 21 06/08/2021   ALKPHOS 77 06/08/2021   BILITOT <0.1 (L) 06/08/2021     Microbiology: Recent Results (from the past 240 hour(s))  C Difficile Quick Screen w PCR reflex     Status: None   Collection Time: 05/30/21  4:57 PM   Specimen: Per Rectum; Stool  Result Value Ref Range Status   C Diff antigen NEGATIVE NEGATIVE Final   C Diff toxin NEGATIVE NEGATIVE Final   C Diff interpretation No C. difficile detected.  Final    Comment: Performed at [facility], [address] [place], [place] [address]  Aerobic/Anaerobic Culture w Gram Stain (surgical/deep wound)     Status: None (Preliminary result)   Collection Time: 06/06/21 10:36 AM   Specimen: Abscess  Result Value Ref Range Status   Specimen Description ABSCESS  Final   Special Requests DRAIN  Final   Gram Stain   Final    ABUNDANT WBC PRESENT, PREDOMINANTLY PMN RARE GRAM POSITIVE COCCI IN PAIRS    Culture   Final    CULTURE REINCUBATED FOR BETTER GROWTH HOLDING FOR POSSIBLE ANAEROBE Performed at [facility], [address] [place], [place] [address]    Report Status PENDING  Incomplete    Rexene Alberts, MSN, NP-C [facility] Cell: [phone] Pager: [phone]  06/09/2021  11:57 AM

## 2021-06-09 NOTE — Progress Notes (Signed)
Referring Physician(s): Nadeen Landau  Supervising Physician: Arne Cleveland  Patient Status:  [facility] - In-pt  Chief Complaint: Intra-abdominal abscess s/p drain placement 06/06/21  Subjective:  Patient sitting in chair, denies any complaints regarding the drain but states she needs to "boo boo" soon. No concerns noted by staff.  Allergies: Gluten meal  Medications: Prior to Admission medications   Medication Sig Start Date End Date Taking? Authorizing Provider  albuterol (VENTOLIN HFA) 108 (90 Base) MCG/ACT inhaler Inhale 2 puffs into the lungs every 6 (six) hours as needed for wheezing or shortness of breath.   Yes [provider]  amLODipine (NORVASC) 5 MG tablet Take 1 tablet (5 mg total) by mouth daily. 10/04/20  Yes Nahser, Wonda Cheng, MD  carvedilol (COREG) 25 MG tablet Take 1 tablet (25 mg total) by mouth 2 (two) times daily with a meal. 12/05/12  Yes Nahser, Wonda Cheng, MD  esomeprazole (NEXIUM) 20 MG capsule Take 1 capsule by mouth daily.   Yes [provider]  furosemide (LASIX) 20 MG tablet Take 1 tablet (20 mg total) by mouth daily. 05/22/21  Yes Hans Eden, NP  levothyroxine (SYNTHROID) 112 MCG tablet Take 1 tablet by mouth Monday through Saturday and 1/2 tablet on Sunday. 11/08/20  Yes Elayne Snare, MD  metFORMIN (GLUCOPHAGE) 500 MG tablet Take 500 mg by mouth 2 (two) times daily. 08/23/20  Yes [provider]  OVER THE COUNTER MEDICATION Take 1 tablet by mouth daily. Medication: Vitamin b12 3000 mcg   Yes [provider]  potassium chloride (KLOR-CON) 10 MEQ tablet Take 2 tablets (20 mEq total) by mouth daily. 11/07/20  Yes Nahser, Wonda Cheng, MD  predniSONE (DELTASONE) 20 MG tablet Take 60 mg by mouth daily with breakfast.   Yes [provider]  rosuvastatin (CRESTOR) 5 MG tablet Take 1 tablet (5 mg total) by mouth daily. 11/08/20  Yes Nahser, Wonda Cheng, MD  Blood Glucose Monitoring Suppl (ONETOUCH VERIO REFLECT) w/Device  KIT 1 each by Other route as directed. 08/10/20   [provider]  Fluticasone-Salmeterol (ADVAIR) 250-50 MCG/DOSE AEPB Inhale 1 puff into the lungs daily as needed for allergies.    [provider]  glucose blood (ONETOUCH VERIO) test strip Use as intstructed to check blood sugar once a day Dx Code E11.9 02/18/21   Elayne Snare, MD  Lancets [facility] ULTRASOFT) lancets Use as instructed 10/13/20   Elayne Snare, MD  meclizine (ANTIVERT) 25 MG tablet Take 1 tablet by mouth daily as needed for dizziness.    [provider]     Vital Signs: BP 95/72 (BP Location: Left Arm)   Pulse 86   Temp 97.6 F (36.4 C) (Oral)   Resp 16   Ht 5' 2"  (1.575 m)   Wt 240 lb 8.4 oz (109.1 kg)   SpO2 99%   BMI 43.99 kg/m   Physical Exam Vitals and nursing note reviewed.  Constitutional:      General: She is not in acute distress. Cardiovascular:     Rate and Rhythm: Normal rate.  Pulmonary:     Effort: Pulmonary effort is normal.  Abdominal:     Comments: (+) lower abdominal drain to suction with ~10 cc tan purulent output. Flushes/aspirates easily. Suture/stat lock in tact. Clean, dry, dressed appropriately. Non tender.  Skin:    General: Skin is warm and dry.  Neurological:     Mental Status: She is alert.    Imaging: CT IMAGE GUIDED DRAINAGE BY PERCUTANEOUS CATHETER  Result Date: 06/06/2021 INDICATION: Diverticular abscess. Please perform image guided drainage catheter placement for infection source control purposes. Note, patient was to undergo image guided drainage catheter placement on 05/28/2021 however the procedure was terminated secondary to lack of an adequate percutaneous window. EXAM: ULTRASOUND CT-GUIDED LOWER ABDOMINAL/UPPER PELVIC DIVERTICULAR ABSCESS DRAINAGE CATHETER PLACEMENT COMPARISON:  CT abdomen pelvis-06/03/2021; 05/26/2021 Aborted image guided drainage catheter placement-05/28/2021 MEDICATIONS: The patient is currently admitted to the hospital and  receiving intravenous antibiotics. The antibiotics were administered within an appropriate time frame prior to the initiation of the procedure. ANESTHESIA/SEDATION: Moderate (conscious) sedation was employed during this procedure. A total of Versed 2 mg and Fentanyl 100 mcg was administered intravenously. Moderate Sedation Time: 21 minutes. The patient's level of consciousness and vital signs were monitored continuously by radiology nursing throughout the procedure under my direct supervision. CONTRAST:  None COMPLICATIONS: None immediate. PROCEDURE: Informed written consent was obtained from the patient after a discussion of the risks, benefits and alternatives to treatment. The patient was placed supine on the CT gantry and a pre procedural CT was performed re-demonstrating the known abscess/fluid collection within the midline of the lower abdomen/pelvis with dominant ill-defined air in fluid containing component measuring at least 9.1 x 7.1 cm (image 13, series 2). The CT gantry table position was marked and the collection was identified sonographically. The procedure was planned. A timeout was performed prior to the initiation of the procedure. The skin overlying the ventral aspect of the lower abdomen/upper pelvis was prepped and draped in the usual sterile fashion. The overlying soft tissues were anesthetized with 1% lidocaine with epinephrine. Under direct ultrasound guidance, the complex fluid collection was accessed with 18 gauge trocar needle. Multiple ultrasound images were saved procedural documentation purposes. A small amount of purulent fluid was aspirated and a short Amplatz wire was coiled within the collection. Appropriate positioning was confirmed with a limited CT scan. The tract was serially dilated allowing placement of a 10 [place] all-purpose drainage catheter. Appropriate positioning was confirmed with a limited postprocedural CT scan. Approximately 180 ml of purulent fluid was aspirated. The  tube was connected to a drainage bag and sutured in place. A dressing was placed. The patient tolerated the procedure well without immediate post procedural complication. IMPRESSION: Successful ultrasound and CT guided placement of a 10 French all purpose drain catheter into the midline of the lower abdomen/upper pelvis with aspiration of 180 mL of purulent fluid. Samples were sent to the laboratory as requested by the ordering clinical team. Electronically Signed   By: Sandi Mariscal M.D.   On: 06/06/2021 14:30    Labs:  CBC: Recent Labs    06/03/21 1226 06/03/21 2013 06/04/21 0400 06/05/21 0512 06/08/21 0459 06/09/21 0458  WBC 15.5*  --   --  14.8* 10.8* 12.1*  HGB 7.3*   < > 6.6* 8.5* 7.8* 8.6*  HCT 22.4*   < > 20.7* 26.1* 25.3* 27.2*  PLT 176  --   --  209 213 249   < > = values in this interval not displayed.    COAGS: Recent Labs    06/06/21 0230  INR 1.1    BMP: Recent Labs    11/07/20 1058 03/26/21 1927 06/06/21 0230 06/07/21 0350 06/08/21 0459 06/09/21 0458  NA 141   < > 138 138 135 137  K 4.1   < > 3.9 4.2 4.3 3.9  CL 103   < > 99 100 103 99  CO2 24   < > 27  30 27 29   GLUCOSE 118*   < > 123* 136* 145* 76  BUN 13   < > 10 18 17 15   CALCIUM 9.9   < > 8.9 8.9 8.5* 9.1  CREATININE 1.06*   < > 0.65 0.78 0.76 0.80  GFRNONAA 53*   < > >60 >60 >60 >60  GFRAA 61  --   --   --   --   --    < > = values in this interval not displayed.    LIVER FUNCTION TESTS: Recent Labs    06/03/21 0845 06/05/21 0500 06/06/21 0230 06/08/21 0459  BILITOT 0.6 0.5 0.6 <0.1*  AST 14* 24 24 21   ALT 19 25 28 24   ALKPHOS 94 93 93 77  PROT 4.6* 4.4* 4.5* 4.4*  ALBUMIN 1.9* 2.0* 2.0* 2.0*    Assessment and Plan:  70 y/o F with perforated sigmoid diverticulitis with abscess s/p drain placement in IR 6/21 seen today for routine follow up.  Per I/O 25 cc output so far today, ~10 cc tan purulent output in bulb today. No concerns with flushing noted.  WBC 12.1 today, was 10.8  yesterday, afebrile. Culture of aspirate pending, currently receiving Zosyn per ID. Given down trending output will monitor over the weekend/early next week and plan for repeat imaging/possible drain injection to assess for abscess resolution.  Continue TID flushes with 5 cc NS, record drain output Qshift, dressing changes QD/PRN if soiled.   IR will continue to follow.  Electronically Signed: Joaquim Nam, PA-C 06/09/2021, 1:59 PM   I spent a total of 15 Minutes at the the patient's bedside AND on the patient's hospital floor or unit, greater than 50% of which was counseling/coordinating care for intra-abdominal drain follow up.

## 2021-06-09 NOTE — Progress Notes (Signed)
Inpatient Rehab Admissions Coordinator:   Awaiting determination from insurance regarding CIR auth.    Estill Dooms, PT, DPT Admissions Coordinator [phone] 06/09/21  10:18 AM

## 2021-06-09 NOTE — Progress Notes (Signed)
Patient is surgically stable for DC.  ID has recommended continuing IV abx therapy and a follow up scan on Monday, so will defer to them on this plan.  The patient could certainly still go to CIR and these things be done while she is there.  We can follow up next week after these things are done to check in.  Otherwise continue drains and diet and will arrange outpatient follow up.  [name] 11:06 AM 06/09/2021

## 2021-06-09 NOTE — Progress Notes (Signed)
Inpatient Diabetes Program Recommendations  AACE/ADA: New Consensus Statement on Inpatient Glycemic Control  Target Ranges:  Prepandial:   less than 140 mg/dL      Peak postprandial:   less than 180 mg/dL (1-2 hours)      Critically ill patients:  140 - 180 mg/dL   Results for [name], FOSDICK VAMPLE (MRN [id number]) as of 06/09/2021 09:58  Ref. Range 06/08/2021 07:42 06/08/2021 12:21 06/08/2021 15:16 06/08/2021 20:01 06/09/2021 08:18  Glucose-Capillary Latest Ref Range: 70 - 99 mg/dL 638 (H) 937 (H) 342 (H) 152 (H) 65 (L)    Review of Glycemic Control  Diabetes history: DM2 Outpatient Diabetes medications: Metformin 500 mg BID Current orders for Inpatient glycemic control: Lantus 20 units QHS, Novolog 0-20 units TID with meals; Solumedrol 16 mg daily  Inpatient Diabetes Program Recommendations:    Insulin: Per notes in chart TPN was discontinued and tolerating PO intake. Fasting glucose 65 mg/dl today. Please consider decreasing Lantus 10 units QHS.  Thanks, Orlando Penner, RN, MSN, CDE Diabetes Coordinator Inpatient Diabetes Program [phone] (Team Pager from 8am to 5pm)

## 2021-06-09 NOTE — Progress Notes (Signed)
Physical Therapy Treatment Patient Details Name: [name] MRN: [id number] DOB: 1951/05/18 Today's Date: 06/09/2021    History of Present Illness 70 y.o. female admitted on 05/26/21 for sigmoid diverticulitis with perforation and abscess, IR consulted for surgery vs drain placement and had an unsucessful attempt on 05/28/21.  Bil PE started on IV heparin. S/p CT guided placement of a 10 Fr drainage catheter placement into the diverticular abscess within the midline of the lower abdomen/upper pelvis 6/21. PMH including anemia, CHF, DM, graves disease, HTN, obesity, stroke, seizure, and thyroid disease.    PT Comments    Pt is very motivated to participate and improve. She continues to be limited by deficits in endurance, balance, and lower extremity weakness. Thus, focused session on increasing lower extremity strength, particularly with hip flexion in standing to encourage feet clearance with gait. Attempted to apply the strengthening exercises into functional mobility through performing multiple transfers from a low bed surface and gait within the room. Will continue to follow acutely. Current recommendations remain appropriate.   Follow Up Recommendations  CIR     Equipment Recommendations  Rolling walker with 5" wheels;Wheelchair (measurements PT);Wheelchair cushion (measurements PT);Hospital bed    Recommendations for Other Services       Precautions / Restrictions Precautions Precautions: Fall Precaution Comments: flexiseal, R gravity drain Restrictions Weight Bearing Restrictions: No    Mobility  Bed Mobility Overal bed mobility: Needs Assistance Bed Mobility: Supine to Sit     Supine to sit: HOB elevated;Min guard     General bed mobility comments: Increased time managing legs off bed and using bed rails to ascend trunk with HOB significantly elevated, min guard assist.    Transfers Overall transfer level: Needs assistance Equipment used: Rolling walker (2  wheeled) Transfers: Sit to/from Stand Sit to Stand: Mod assist;Min assist         General transfer comment: Sit <> stand 4x from EOB, needing mod-minA each rep. Improved recall of placement of hands with transfer, but needing intermittent cues. Pt rocks 2-3x prior to transfer attempt to gain momentum. Needs x2-3 attempts for first transfer rep to be successful.  Ambulation/Gait Ambulation/Gait assistance: Min assist Gait Distance (Feet): 30 Feet Assistive device: Rolling walker (2 wheeled) Gait Pattern/deviations: Decreased stride length;Trunk flexed;Step-through pattern;Shuffle Gait velocity: decreased Gait velocity interpretation: <1.31 ft/sec, indicative of household ambulator General Gait Details: Pt with trunk flexed, taking small slow steps. Pt with poor feet clearance, cues to correct but min success. Needs targets to ambulate to in order to direct her. Tends to bump RW into obstacles often. Pt fatigues quickly.   Stairs             Wheelchair Mobility    Modified Rankin (Stroke Patients Only)       Balance Overall balance assessment: Needs assistance Sitting-balance support: No upper extremity supported;Feet supported Sitting balance-Leahy Scale: Fair     Standing balance support: Bilateral upper extremity supported;During functional activity Standing balance-Leahy Scale: Poor Standing balance comment: reliant on support in standing.                            Cognition Arousal/Alertness: Awake/alert Behavior During Therapy: WFL for tasks assessed/performed Overall Cognitive Status: Impaired/Different from baseline Area of Impairment: Memory;Following commands;Safety/judgement;Problem solving;Orientation;Attention                 Orientation Level: Disoriented to;Time Current Attention Level: Sustained Memory: Decreased short-term memory Following Commands: Follows one step commands  with increased time;Follows one step commands  consistently;Follows multi-step commands inconsistently Safety/Judgement: Decreased awareness of safety Awareness: Intellectual Problem Solving: Difficulty sequencing;Requires verbal cues;Requires tactile cues;Slow processing General Comments: Pt disoriented to time, thinking it was the afternoon when it was actually ~8:45 am. Pt with moments of slow processing and needing repeated single step cues. Poor sequencing, needing continual directions with mobility. Poor awareness of safety, bumping into obstacles often with her RW.      Exercises General Exercises - Lower Extremity Long Arc Quad: Both;10 reps;Seated (focusing on eccentric control) Hip Flexion/Marching: Both;Limitations;5 reps (with RW) Toe Raises: Both;Seated;10 reps (attempted in standing, but unsuccessful without significant compensation) Heel Raises: Both;Seated;10 reps (attempted in standing, but unsuccessful without significant compensation) Other Exercises Other Exercises: Sit <> stand 4x from EOB    General Comments        Pertinent Vitals/Pain Pain Assessment: No/denies pain    Home Living                      Prior Function            PT Goals (current goals can now be found in the care plan section) Acute Rehab PT Goals Patient Stated Goal: to improve PT Goal Formulation: With patient Time For Goal Achievement: 06/13/21 Potential to Achieve Goals: Good Progress towards PT goals: Progressing toward goals    Frequency    Min 4X/week      PT Plan Current plan remains appropriate    Co-evaluation              AM-PAC PT "6 Clicks" Mobility   Outcome Measure  Help needed turning from your back to your side while in a flat bed without using bedrails?: A Little Help needed moving from lying on your back to sitting on the side of a flat bed without using bedrails?: A Little Help needed moving to and from a bed to a chair (including a wheelchair)?: A Little Help needed standing up from  a chair using your arms (e.g., wheelchair or bedside chair)?: A Lot Help needed to walk in hospital room?: A Little Help needed climbing 3-5 steps with a railing? : Total 6 Click Score: 15    End of Session Equipment Utilized During Treatment: Gait belt Activity Tolerance: Patient tolerated treatment well Patient left: with call bell/phone within reach;in chair;with chair alarm set;with family/visitor present Nurse Communication: Mobility status (rectal tube fell out during transfer) PT Visit Diagnosis: Muscle weakness (generalized) (M62.81);Difficulty in walking, not elsewhere classified (R26.2);Unsteadiness on feet (R26.81);Other abnormalities of gait and mobility (R26.89)     Time: 0109-3235 PT Time Calculation (min) (ACUTE ONLY): 27 min  Charges:  $Therapeutic Exercise: 8-22 mins $Therapeutic Activity: 8-22 mins                     Raymond Gurney, PT, DPT Acute Rehabilitation Services  Pager: [phone] Office: [phone]    Jewel Baize 06/09/2021, 9:34 AM

## 2021-06-09 NOTE — Progress Notes (Signed)
PROGRESS NOTE    [name]  EQA:[id number] DOB: [birth year] DOA: 05/26/2021 PCP: Ollen Bowl, MD  Chief Complaint  Patient presents with   Abdominal Pain    Brief Narrative: As well as admitted to the hospital with a perforated diverticulitis complicated by intra-abdominal abscess, pulmonary embolism and acute blood loss anemia due to lower GI bleed.   Subjective: No acute overnight events reported by nursing staff.  Seen and examined at bedside today.  She is sitting in the recliner next to the chair.  Tolerating p.o. diet.  Pain is well controlled.  Passing flatus.  No longer on TPN.  Daughter is present at bedside.  No fevers or chills.  Drain output decreasing.   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 (no sepsis) No abdominal pain and tolerating po with no nausea or vomiting.  Abscess drain culture being reincubated.  Awaiting final results.  ID following. Wbc is slightly increased to 12.1 today.  But on steroids.  Abdominal abscess drain has decreasing output.  IR following.   Continue antibiotic therapy with Zosyn # 13 Tolerating po well, TPN has been discontinued. Continue to encourage out of bed to the chair tid with meals.    2. Acute bilateral pulmonary embolism/ acute hypoxemic respiratory failure. acute deep vein thrombosis involving the left popliteal vein and left posterior tibial arteries. Sp IVC filter. sp PRBC transfusion   Hemoglobin improved to 8.6 today and hematocrit improved to 27.2. Signs of bleeding.  Continue Lovenox prophylactic dosing. When no signs of further bleeding and Hgb and Hct stable will plan to  upgrade to full anticoagulation and possible removal of IVC filter.    3. Acute blood loss anemia, due to GI bleeding (lower).  Sp PRBC 1 unit PRBC transfusion Upper endoscopy with normal esophagus and no gastric or duodenal ulcers on 06/01/21/   Hemoglobin improved this morning Continue Lovenox prophylactic dosing   4. Uncontrolled T2DM Hyper and hypoglycemia.   continue Lantus 20 units daily and sliding scale insulin with meals 3 times a day Patient is tolerating po well.  Sugars controlled in the low 100s.   5. CKD stage 3a  stable    6. Heart failure with diastolic dysfunction No signs of acute  decompensation   7. Hyperthyroidism. continue levothyroxine and continue with steroid taper.   8. Seizures. No clinical seizures.     Patient continue to be at high risk for worsening bleeding    Status is: Inpatient   Remains inpatient appropriate because:Inpatient level of care appropriate due to severity of illness   Dispo: The patient is from: Home              Anticipated d/c is to: CIR              Patient currently is not medically stable to d/c.              Difficult to place patient No     DVT prophylaxis:      Enoxaparin   Code Status:              full  Family Communication:       I spoke with patient's daughters at the bedside,  we talked in detail about patient's condition, plan of care and prognosis and all questions were addressed.     Consultants:  Surgery ID GI    Procedures:  Upper endoscopy Abdominal abscess drain    Antimicrobials:  Zosyn       Objective: Vitals:   06/09/21 0400 06/09/21 0815 06/09/21 1137 06/09/21 1516  BP: 123/89 120/70 95/72 129/79  Pulse: 70 80 86 90  Resp: 16 16 16 18   Temp: 98.6 F (37 C) 97.6 F (36.4 C) 97.6 F (36.4 C) 97.8 F (36.6 C)  TempSrc: Oral Oral Oral Oral  SpO2: 97% 98% 99% 96%  Weight:      Height:        Intake/Output Summary (Last 24 hours) at 06/09/2021 1711 Last data filed at 06/09/2021  1527 Gross per 24 hour  Intake 755 ml  Output 1875 ml  Net -1120 ml   Filed Weights   05/27/21 0041  Weight: 109.1 kg    Examination:  General exam: Appears calm and comfortable.  Deconditioned. Respiratory system: Clear to auscultation. Respiratory effort normal. Cardiovascular system: S1 & S2 heard, RRR. No JVD, murmurs, rubs, gallops or clicks. No pedal edema. Gastrointestinal system: Abdomen is nondistended, soft and nontender. No organomegaly or masses felt. Normal bowel sounds heard.  Abdominal drain in place. Central nervous system: Alert and oriented. No focal neurological deficits. Extremities: Symmetric 5 x 5 power. Skin: No rashes, lesions or ulcers Psychiatry: Judgement and insight appear normal. Mood & affect appropriate.     Data Reviewed: I have personally reviewed following labs and imaging studies  CBC: Recent Labs  Lab 06/03/21 0312 06/03/21 1211 06/03/21 1226 06/03/21 2013 06/04/21 0400 06/05/21 0512 06/08/21 0459 06/09/21 0458  WBC 16.3*  --  15.5*  --   --  14.8* 10.8* 12.1*  NEUTROABS  --   --  13.1*  --   --  10.6* 7.4 7.8*  HGB 7.2*   < > 7.3* 7.2* 6.6* 8.5* 7.8* 8.6*  HCT 21.7*   < > 22.4* 22.1* 20.7* 26.1* 25.3* 27.2*  MCV 95.6  --  96.1  --   --  94.9 98.8 98.2  PLT 157  --  176  --   --  209 213 249   < > = values in this interval not displayed.    Basic Metabolic Panel: Recent Labs  Lab 06/04/21 0400 06/05/21 0500 06/06/21 0230 06/07/21 0350 06/08/21 0459 06/09/21 0458  NA 136 136 138 138 135 137  K 3.8 4.0 3.9 4.2 4.3 3.9  CL 102 99 99 100 103 99  CO2 29 30 27 30 27 29   GLUCOSE 123* 143* 123* 136* 145* 76  BUN 10 9 10 18 17 15   CREATININE 0.73 0.66 0.65 0.78 0.76 0.80  CALCIUM 8.4* 8.5* 8.9 8.9 8.5* 9.1  MG 1.6* 1.9 1.8 1.9 1.9  --   PHOS 2.3* 3.1 3.1 2.8 3.1  --     GFR: Estimated Creatinine Clearance: 76.1 mL/min (by C-G formula based on SCr of 0.8 mg/dL).  Liver Function Tests: Recent Labs  Lab 06/03/21 0845  06/05/21 0500 06/06/21 0230 06/08/21 0459  AST 14* 24 24 21   ALT 19 25 28 24   ALKPHOS 94 93 93 77  BILITOT 0.6 0.5 0.6 <0.1*  PROT 4.6* 4.4* 4.5* 4.4*  ALBUMIN 1.9* 2.0* 2.0* 2.0*    CBG: Recent Labs  Lab 06/08/21 1516 06/08/21 2001 06/09/21 0818 06/09/21 1135 06/09/21 1516  GLUCAP 300* 152* 65* 148* 230*  Recent Results (from the past 240 hour(s))  Aerobic/Anaerobic Culture w Gram Stain (surgical/deep wound)     Status: None (Preliminary result)   Collection Time: 06/06/21 10:36 AM   Specimen: Abscess  Result Value Ref Range Status   Specimen Description ABSCESS  Final   Special Requests DRAIN  Final   Gram Stain   Final    ABUNDANT WBC PRESENT, PREDOMINANTLY PMN RARE GRAM POSITIVE COCCI IN PAIRS    Culture   Final    CULTURE REINCUBATED FOR BETTER GROWTH MODERATE BACTEROIDES THETAIOTAOMICRON BETA LACTAMASE POSITIVE Performed at [facility], [address] [place], [place] [address]    Report Status PENDING  Incomplete         Radiology Studies: No results found.      Scheduled Meds:  Chlorhexidine Gluconate Cloth  6 each Topical Daily   enoxaparin (LOVENOX) injection  40 mg Subcutaneous Q24H   feeding supplement  237 mL Oral BID BM   insulin aspart  0-20 Units Subcutaneous TID WC   insulin glargine  20 Units Subcutaneous QHS   levothyroxine  112 mcg Oral Once per day on Mon Tue Wed Thu Fri Sat   And   levothyroxine  56 mcg Oral Once per day on Sun   methylPREDNISolone (SOLU-MEDROL) injection  16 mg Intravenous Daily   Followed by   Melene Muller ON 06/11/2021] methylPREDNISolone (SOLU-MEDROL) injection  8 mg Intravenous Daily   pantoprazole  40 mg Oral BID   sodium chloride flush  10-40 mL Intracatheter Q12H   sodium chloride flush  5 mL Intracatheter Q8H   Continuous Infusions:  sodium chloride     sodium chloride     piperacillin-tazobactam (ZOSYN)  IV 3.375 g (06/09/21 1528)     LOS: 14 days    Time spent: 35  minutes    Verdia Kuba, MD [facility]   To contact the attending provider between 7A-7P or the covering provider during after hours 7P-7A, please log into the web site [email or web address] and access using universal [place] password for that web site. If you do not have the password, please call the hospital operator.  06/09/2021, 5:11 PM

## 2021-06-10 LAB — CBC
HCT: 25.9 % — ABNORMAL LOW (ref 36.0–46.0)
Hemoglobin: 8.3 g/dL — ABNORMAL LOW (ref 12.0–15.0)
MCH: 31 pg (ref 26.0–34.0)
MCHC: 32 g/dL (ref 30.0–36.0)
MCV: 96.6 fL (ref 80.0–100.0)
Platelets: 234 10*3/uL (ref 150–400)
RBC: 2.68 MIL/uL — ABNORMAL LOW (ref 3.87–5.11)
RDW: 17.3 % — ABNORMAL HIGH (ref 11.5–15.5)
WBC: 10.7 10*3/uL — ABNORMAL HIGH (ref 4.0–10.5)
nRBC: 0.8 % — ABNORMAL HIGH (ref 0.0–0.2)

## 2021-06-10 LAB — BASIC METABOLIC PANEL
Anion gap: 8 (ref 5–15)
BUN: 13 mg/dL (ref 8–23)
CO2: 28 mmol/L (ref 22–32)
Calcium: 8.9 mg/dL (ref 8.9–10.3)
Chloride: 97 mmol/L — ABNORMAL LOW (ref 98–111)
Creatinine, Ser: 0.98 mg/dL (ref 0.44–1.00)
GFR, Estimated: >60 mL/min (ref >60)
Glucose, Bld: 71 mg/dL (ref 70–99)
Potassium: 3.9 mmol/L (ref 3.5–5.1)
Sodium: 133 mmol/L — ABNORMAL LOW (ref 135–145)

## 2021-06-10 LAB — GLUCOSE, CAPILLARY
Glucose-Capillary: 61 mg/dL — ABNORMAL LOW (ref 70–99)
Glucose-Capillary: 85 mg/dL (ref 70–99)
Glucose-Capillary: 85 mg/dL (ref 70–99)
Glucose-Capillary: 173 mg/dL — ABNORMAL HIGH (ref 70–99)
Glucose-Capillary: 164 mg/dL — ABNORMAL HIGH (ref 70–99)

## 2021-06-10 LAB — TSH: TSH: 21.788 u[IU]/mL — ABNORMAL HIGH (ref 0.350–4.500)

## 2021-06-10 LAB — T4, FREE: Free T4: 0.97 ng/dL (ref 0.61–1.12)

## 2021-06-10 MED ORDER — LEVOTHYROXINE SODIUM 112 MCG PO TABS
112.0000 ug | ORAL_TABLET | Freq: Every day | ORAL | Status: DC
  Administered 2021-06-11 – 2021-06-23 (×12): 112 ug via ORAL
  Filled 2021-06-10 (×12): qty 1

## 2021-06-10 NOTE — Progress Notes (Signed)
PROGRESS NOTE    [name]  EHM:[id number] DOB: [birth year] DOA: 05/26/2021 PCP: Ollen Bowl, MD  Chief Complaint  Patient presents with   Abdominal Pain    Brief Narrative: As well as admitted to the hospital with a perforated diverticulitis complicated by intra-abdominal abscess, pulmonary embolism and acute blood loss anemia due to lower GI bleed.   Subjective: No acute overnight events reported by nursing staff.  Seen and examined at bedside today.  Sitting up in bed  Tolerating p.o. diet.  Pain is well controlled.  Passing flatus.  No longer on TPN.    No fevers or chills.  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 (no sepsis) No abdominal pain and tolerating po with no nausea or vomiting.  Abscess drain culture being reincubated.  Awaiting final results.  ID following. Wbc improved.  Abdominal abscess drain has decreasing output.  IR following.   Continue antibiotic therapy with Zosyn # 14 Tolerating po well, TPN has been discontinued. Continue to encourage out of bed to the chair tid with meals.    2. Acute bilateral pulmonary embolism/ acute hypoxemic respiratory failure. acute deep vein thrombosis involving the left popliteal vein and left posterior tibial arteries. Sp IVC filter. sp PRBC transfusion   Hemoglobin stable at 8.3 No signs of bleeding.  Continue Lovenox prophylactic dosing. When no signs of further bleeding and Hgb and Hct stable will plan to upgrade to full anticoagulation and possible removal of IVC filter.    3. Acute blood loss anemia, due to GI bleeding (lower).  Sp PRBC 1  unit PRBC transfusion Upper endoscopy with normal esophagus and no gastric or duodenal ulcers on 06/01/21/   Hemoglobin stable Continue Lovenox prophylactic dosing   4. Uncontrolled T2DM Hyper and hypoglycemia.   continue Lantus 20 units daily and sliding scale insulin with meals 3 times a day Patient is tolerating po well.  Sugars controlled in the low 100s.   5. CKD stage 3a  stable    6. Heart failure with diastolic dysfunction No signs of acute  decompensation   7. Hypothyroidism: Follows with [facility] endocrine on daily. Unclear how compliant she is with this medication. TSH elevated at 21. Keep current dose and close follow up with endocrine on d/c. Last saw them early June   8. Seizures. No clinical seizures.     Patient continue to be at high risk for worsening bleeding    Status is: Inpatient   Remains inpatient appropriate because:Inpatient level of care appropriate due to severity of illness   Dispo: The patient is from: Home              Anticipated d/c is to: CIR vs SNF              Patient currently is not medically stable to d/c.              Difficult to place patient No     DVT prophylaxis:      Enoxaparin   Code Status:              full  Family Communication:       No family  present at bedside     Consultants:  Surgery ID GI    Procedures:  Upper endoscopy Abdominal abscess drain    Antimicrobials:  Zosyn       Objective: Vitals:   06/10/21 0351 06/10/21 0751 06/10/21 1100 06/10/21 1500  BP: 107/81 105/66 122/78 (!) 130/95  Pulse: 77 75 76 70  Resp:    15  Temp: 97.8 F (36.6 C) 98.1 F (36.7 C) 97.7 F (36.5 C) 98.1 F (36.7 C)  TempSrc: Oral Oral Oral Oral  SpO2: 95% 98% 98% 100%  Weight:      Height:        Intake/Output Summary (Last 24 hours) at 06/10/2021 1929 Last data filed at 06/10/2021 1726 Gross per 24 hour  Intake 1038.2 ml  Output 10 ml  Net 1028.2 ml    Filed Weights   05/27/21 0041  Weight: 109.1 kg     Examination:  General exam: Appears calm and comfortable.  Deconditioned. Exophthalmos  Respiratory system: Clear to auscultation. Respiratory effort normal. Cardiovascular system: S1 & S2 heard, RRR. No JVD, murmurs, rubs, gallops or clicks. No pedal edema. Gastrointestinal system: Abdomen is nondistended, soft and nontender. No organomegaly or masses felt. Normal bowel sounds heard.  Abdominal drain in place. Central nervous system: Alert and oriented. No focal neurological deficits. Extremities: Symmetric 5 x 5 power. Skin: No rashes, lesions or ulcers Psychiatry: Judgement and insight appear normal. Mood & affect appropriate.     Data Reviewed: I have personally reviewed following labs and imaging studies  CBC: Recent Labs  Lab 06/04/21 0400 06/05/21 0512 06/08/21 0459 06/09/21 0458 06/10/21 0244  WBC  --  14.8* 10.8* 12.1* 10.7*  NEUTROABS  --  10.6* 7.4 7.8*  --   HGB 6.6* 8.5* 7.8* 8.6* 8.3*  HCT 20.7* 26.1* 25.3* 27.2* 25.9*  MCV  --  94.9 98.8 98.2 96.6  PLT  --  209 213 249 234     Basic Metabolic Panel: Recent Labs  Lab 06/04/21 0400 06/05/21 0500 06/06/21 0230 06/07/21 0350 06/08/21 0459 06/09/21 0458 06/10/21 0244  NA 136 136 138 138 135 137 133*  K 3.8 4.0 3.9 4.2 4.3 3.9 3.9  CL 102 99 99 100 103 99 97*  CO2 29 30 27 30 27 29 28   GLUCOSE 123* 143* 123* 136* 145* 76 71  BUN 10 9 10 18 17 15 13   CREATININE 0.73 0.66 0.65 0.78 0.76 0.80 0.98  CALCIUM 8.4* 8.5* 8.9 8.9 8.5* 9.1 8.9  MG 1.6* 1.9 1.8 1.9 1.9  --   --   PHOS 2.3* 3.1 3.1 2.8 3.1  --   --      GFR: Estimated Creatinine Clearance: 62.1 mL/min (by C-G formula based on SCr of 0.98 mg/dL).  Liver Function Tests: Recent Labs  Lab 06/05/21 0500 06/06/21 0230 06/08/21 0459  AST 24 24 21   ALT 25 28 24   ALKPHOS 93 93 77  BILITOT 0.5 0.6 <0.1*  PROT 4.4* 4.5* 4.4*  ALBUMIN 2.0* 2.0* 2.0*     CBG: Recent Labs  Lab 06/09/21 2200 06/10/21 0803 06/10/21 0839 06/10/21 1125  06/10/21 1602  GLUCAP 139* 61* 85 85 173*      Recent Results (from the past 240 hour(s))  Aerobic/Anaerobic Culture w Gram Stain (surgical/deep wound)     Status: None (Preliminary result)   Collection Time: 06/06/21 10:36 AM   Specimen: Abscess  Result Value Ref Range Status   Specimen Description ABSCESS  Final   Special Requests  DRAIN  Final   Gram Stain   Final    ABUNDANT WBC PRESENT, PREDOMINANTLY PMN RARE GRAM POSITIVE COCCI IN PAIRS    Culture   Final    FEW PSEUDOMONAS AERUGINOSA SUSCEPTIBILITIES TO FOLLOW WITHIN MIXED ORGANISMS MODERATE BACTEROIDES THETAIOTAOMICRON BETA LACTAMASE POSITIVE Performed at [facility], [address] [place], [place] [address]    Report Status PENDING  Incomplete          Radiology Studies: No results found.      Scheduled Meds:  Chlorhexidine Gluconate Cloth  6 each Topical Daily   enoxaparin (LOVENOX) injection  40 mg Subcutaneous Q24H   feeding supplement  237 mL Oral BID BM   insulin aspart  0-20 Units Subcutaneous TID WC   insulin glargine  20 Units Subcutaneous QHS   [START ON 06/11/2021] levothyroxine  112 mcg Oral Q0600   [START ON 06/11/2021] methylPREDNISolone (SOLU-MEDROL) injection  8 mg Intravenous Daily   pantoprazole  40 mg Oral BID   sodium chloride flush  10-40 mL Intracatheter Q12H   sodium chloride flush  5 mL Intracatheter Q8H   Continuous Infusions:  sodium chloride     sodium chloride     piperacillin-tazobactam (ZOSYN)  IV 3.375 g (06/10/21 1353)     LOS: 15 days    Time spent: 35 minutes    Verdia Kuba, MD [facility]   To contact the attending provider between 7A-7P or the covering provider during after hours 7P-7A, please log into the web site [email or web address] and access using universal [place] password for that web site. If you do not have the password, please call the hospital operator.  06/10/2021, 7:29 PM

## 2021-06-10 NOTE — Plan of Care (Signed)
  Problem: Health Behavior/Discharge Planning: Goal: Ability to manage health-related needs will improve Outcome: Progressing   Problem: Clinical Measurements: Goal: Ability to maintain clinical measurements within normal limits will improve Outcome: Progressing   

## 2021-06-11 LAB — COMPREHENSIVE METABOLIC PANEL
ALT: 26 U/L (ref 0–44)
AST: 20 U/L (ref 15–41)
Albumin: 2.2 g/dL — ABNORMAL LOW (ref 3.5–5.0)
Alkaline Phosphatase: 82 U/L (ref 38–126)
Anion gap: 6 (ref 5–15)
BUN: 10 mg/dL (ref 8–23)
CO2: 28 mmol/L (ref 22–32)
Calcium: 8.7 mg/dL — ABNORMAL LOW (ref 8.9–10.3)
Chloride: 103 mmol/L (ref 98–111)
Creatinine, Ser: 1.05 mg/dL — ABNORMAL HIGH (ref 0.44–1.00)
GFR, Estimated: 57 mL/min — ABNORMAL LOW (ref >60)
Glucose, Bld: 68 mg/dL — ABNORMAL LOW (ref 70–99)
Potassium: 3.5 mmol/L (ref 3.5–5.1)
Sodium: 137 mmol/L (ref 135–145)
Total Bilirubin: 0.5 mg/dL (ref 0.3–1.2)
Total Protein: 4.8 g/dL — ABNORMAL LOW (ref 6.5–8.1)

## 2021-06-11 LAB — AEROBIC/ANAEROBIC CULTURE W GRAM STAIN (SURGICAL/DEEP WOUND)

## 2021-06-11 LAB — CBC
HCT: 26.9 % — ABNORMAL LOW (ref 36.0–46.0)
Hemoglobin: 8.5 g/dL — ABNORMAL LOW (ref 12.0–15.0)
MCH: 30.7 pg (ref 26.0–34.0)
MCHC: 31.6 g/dL (ref 30.0–36.0)
MCV: 97.1 fL (ref 80.0–100.0)
Platelets: 252 10*3/uL (ref 150–400)
RBC: 2.77 MIL/uL — ABNORMAL LOW (ref 3.87–5.11)
RDW: 17.7 % — ABNORMAL HIGH (ref 11.5–15.5)
WBC: 10.1 10*3/uL (ref 4.0–10.5)
nRBC: 0.4 % — ABNORMAL HIGH (ref 0.0–0.2)

## 2021-06-11 LAB — GLUCOSE, CAPILLARY
Glucose-Capillary: 54 mg/dL — ABNORMAL LOW (ref 70–99)
Glucose-Capillary: 75 mg/dL (ref 70–99)
Glucose-Capillary: 141 mg/dL — ABNORMAL HIGH (ref 70–99)
Glucose-Capillary: 112 mg/dL — ABNORMAL HIGH (ref 70–99)

## 2021-06-11 MED ORDER — SACCHAROMYCES BOULARDII 250 MG PO CAPS
250.0000 mg | ORAL_CAPSULE | Freq: Two times a day (BID) | ORAL | Status: DC
  Administered 2021-06-11 – 2021-06-23 (×23): 250 mg via ORAL
  Filled 2021-06-11 (×24): qty 1

## 2021-06-11 MED ORDER — SIMETHICONE 80 MG PO CHEW
80.0000 mg | CHEWABLE_TABLET | Freq: Once | ORAL | Status: AC
  Administered 2021-06-11: 80 mg via ORAL
  Filled 2021-06-11: qty 1

## 2021-06-11 NOTE — Progress Notes (Signed)
PROGRESS NOTE    [name]  DXA:[id number] DOB: 1951-05-17 DOA: 05/26/2021 PCP: Ollen Bowl, MD  Chief Complaint  Patient presents with   Abdominal Pain    Brief Narrative: As well as admitted to the hospital with a perforated diverticulitis complicated by intra-abdominal abscess, pulmonary embolism and acute blood loss anemia due to lower GI bleed.   Subjective: No acute overnight events reported by nursing staff.  Seen and examined at bedside today.  Sitting up in bed  Tolerating p.o. diet.  Pain is well controlled.  Passing flatus.  No longer on TPN.    No fevers or chills.  Drains being managed by surgery. Daughters present at bedside.    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 (no sepsis) No abdominal pain and tolerating po with no nausea or vomiting.  Abscess drain culture growing Pseudomonas and Bacteroides. Awaiting sensitivities and susceptibilities.  ID following. Abdominal abscess drain has decreasing output.  IR following.   Continue antibiotic therapy with Zosyn # 15 Tolerating po well, no longer on TPN. Continue to encourage out of bed to the chair tid with meals.    2. Acute bilateral pulmonary embolism/ acute hypoxemic respiratory failure. acute deep vein thrombosis involving the left popliteal vein and left posterior tibial arteries. Sp IVC filter. sp PRBC transfusion   Hemoglobin stable  No signs of bleeding.  Continue Lovenox prophylactic dosing. When no signs of further bleeding and Hgb and Hct stable will plan to upgrade to full anticoagulation and possible removal of IVC filter.    3. Acute blood loss anemia, due  to GI bleeding (lower).  Sp PRBC 1 unit PRBC transfusion Upper endoscopy with normal esophagus and no gastric or duodenal ulcers on 06/01/21/   Hemoglobin stable Continue Lovenox prophylactic dosing   4. Uncontrolled T2DM Hyper and hypoglycemia.   continue Lantus 20 units daily and sliding scale insulin with meals 3 times a day Patient is tolerating po well.  Sugars controlled in the low 100s.   5. CKD stage 3a  stable    6. Heart failure with diastolic dysfunction No signs of acute  decompensation   7. Hypothyroidism: Follows with [facility] endocrine on daily. Discussed with daughters and she missed a week of medication prior to hospitalization. TSH elevated at 21. Keep current dose and close follow up with endocrine on d/c. Last saw them early June   8. Seizures. No clinical seizures.     Patient continue to be at high risk   Status is: Inpatient   Remains inpatient appropriate because:Inpatient level of care appropriate due to severity of illness   Dispo: The patient is from: Home              Anticipated d/c is to: CIR vs SNF              Patient currently is not medically stable to d/c.              Difficult to place patient No     DVT prophylaxis:      Enoxaparin   Code Status:              full  Family Communication:  No family present at bedside     Consultants:  Surgery ID GI    Procedures:  Upper endoscopy Abdominal abscess drain    Antimicrobials:  Zosyn       Objective: Vitals:   06/11/21 0457 06/11/21 0700 06/11/21 1100 06/11/21 1500  BP: 102/74 108/66 113/71 122/81  Pulse: 81 81 80 75  Resp: 16 16 15 14   Temp: 98.1 F (36.7 C) 97.8 F (36.6 C) 97.7 F (36.5 C) 98.1 F (36.7 C)  TempSrc: Oral Oral Oral Oral  SpO2: 97% 98% 98% 98%  Weight:      Height:        Intake/Output Summary (Last 24 hours) at 06/11/2021 1722 Last data filed at 06/11/2021 1500 Gross per 24 hour  Intake 1200 ml  Output 605 ml  Net 595 ml    Filed  Weights   05/27/21 0041  Weight: 109.1 kg    Examination:  General exam: Appears calm and comfortable.  Deconditioned. Exophthalmos  Respiratory system: Clear to auscultation. Respiratory effort normal. Cardiovascular system: S1 & S2 heard, RRR. No JVD, murmurs, rubs, gallops or clicks. No pedal edema. Gastrointestinal system: Abdomen is nondistended, soft and nontender. No organomegaly or masses felt. Normal bowel sounds heard.  Abdominal drain in place. Central nervous system: Alert and oriented. No focal neurological deficits. Extremities: Symmetric 5 x 5 power. Skin: No rashes, lesions or ulcers Psychiatry: Judgement and insight appear normal. Mood & affect appropriate.     Data Reviewed: I have personally reviewed following labs and imaging studies  CBC: Recent Labs  Lab 06/05/21 0512 06/08/21 0459 06/09/21 0458 06/10/21 0244 06/11/21 0246  WBC 14.8* 10.8* 12.1* 10.7* 10.1  NEUTROABS 10.6* 7.4 7.8*  --   --   HGB 8.5* 7.8* 8.6* 8.3* 8.5*  HCT 26.1* 25.3* 27.2* 25.9* 26.9*  MCV 94.9 98.8 98.2 96.6 97.1  PLT 209 213 249 234 252     Basic Metabolic Panel: Recent Labs  Lab 06/05/21 0500 06/06/21 0230 06/07/21 0350 06/08/21 0459 06/09/21 0458 06/10/21 0244 06/11/21 0246  NA 136 138 138 135 137 133* 137  K 4.0 3.9 4.2 4.3 3.9 3.9 3.5  CL 99 99 100 103 99 97* 103  CO2 30 27 30 27 29 28 28   GLUCOSE 143* 123* 136* 145* 76 71 68*  BUN 9 10 18 17 15 13 10   CREATININE 0.66 0.65 0.78 0.76 0.80 0.98 1.05*  CALCIUM 8.5* 8.9 8.9 8.5* 9.1 8.9 8.7*  MG 1.9 1.8 1.9 1.9  --   --   --   PHOS 3.1 3.1 2.8 3.1  --   --   --      GFR: Estimated Creatinine Clearance: 58 mL/min (A) (by C-G formula based on SCr of 1.05 mg/dL (H)).  Liver Function Tests: Recent Labs  Lab 06/05/21 0500 06/06/21 0230 06/08/21 0459 06/11/21 0246  AST 24 24 21 20   ALT 25 28 24 26   ALKPHOS 93 93 77 82  BILITOT 0.5 0.6 <0.1* 0.5  PROT 4.4* 4.5* 4.4* 4.8*  ALBUMIN 2.0* 2.0* 2.0* 2.2*      CBG: Recent Labs  Lab 06/10/21 1602 06/10/21 2128 06/11/21 0828 06/11/21 1133 06/11/21 1549  GLUCAP 173* 164* 54* 75 141*      Recent Results (from the past 240 hour(s))  Aerobic/Anaerobic Culture w Gram Stain (surgical/deep wound)     Status: None (Preliminary result)   Collection Time: 06/06/21 10:36 AM   Specimen: Abscess  Result Value Ref Range Status  Specimen Description ABSCESS  Final   Special Requests DRAIN  Final   Gram Stain   Final    ABUNDANT WBC PRESENT, PREDOMINANTLY PMN RARE GRAM POSITIVE COCCI IN PAIRS    Culture   Final    FEW PSEUDOMONAS AERUGINOSA WITHIN MIXED ORGANISMS MODERATE BACTEROIDES THETAIOTAOMICRON BETA LACTAMASE POSITIVE Performed at [facility], [address], [place], [place] [address]    Report Status PENDING  Incomplete   Organism ID, Bacteria PSEUDOMONAS AERUGINOSA  Final      Susceptibility   Pseudomonas aeruginosa - MIC*    CEFTAZIDIME 4 SENSITIVE Sensitive     CIPROFLOXACIN <=0.25 SENSITIVE Sensitive     GENTAMICIN <=1 SENSITIVE Sensitive     IMIPENEM 1 SENSITIVE Sensitive     PIP/TAZO <=4 SENSITIVE Sensitive     CEFEPIME 2 SENSITIVE Sensitive     * FEW PSEUDOMONAS AERUGINOSA          Radiology Studies: No results found.      Scheduled Meds:  Chlorhexidine Gluconate Cloth  6 each Topical Daily   enoxaparin (LOVENOX) injection  40 mg Subcutaneous Q24H   feeding supplement  237 mL Oral BID BM   insulin aspart  0-20 Units Subcutaneous TID WC   insulin glargine  20 Units Subcutaneous QHS   levothyroxine  112 mcg Oral Q0600   methylPREDNISolone (SOLU-MEDROL) injection  8 mg Intravenous Daily   pantoprazole  40 mg Oral BID   saccharomyces boulardii  250 mg Oral BID   sodium chloride flush  10-40 mL Intracatheter Q12H   sodium chloride flush  5 mL Intracatheter Q8H   Continuous Infusions:  sodium chloride     sodium chloride     piperacillin-tazobactam (ZOSYN)  IV 3.375 g (06/11/21 1309)      LOS: 16 days    Time spent: 35 minutes    Verdia Kuba, MD [facility]   To contact the attending provider between 7A-7P or the covering provider during after hours 7P-7A, please log into the web site [email or web address] and access using universal [place] password for that web site. If you do not have the password, please call the hospital operator.  06/11/2021, 5:22 PM

## 2021-06-12 ENCOUNTER — Inpatient Hospital Stay (HOSPITAL_COMMUNITY): Payer: Medicare HMO

## 2021-06-12 LAB — BASIC METABOLIC PANEL
Anion gap: 8 (ref 5–15)
BUN: 8 mg/dL (ref 8–23)
CO2: 29 mmol/L (ref 22–32)
Calcium: 8.8 mg/dL — ABNORMAL LOW (ref 8.9–10.3)
Chloride: 100 mmol/L (ref 98–111)
Creatinine, Ser: 1.06 mg/dL — ABNORMAL HIGH (ref 0.44–1.00)
GFR, Estimated: 57 mL/min — ABNORMAL LOW (ref >60)
Glucose, Bld: 70 mg/dL (ref 70–99)
Potassium: 3.7 mmol/L (ref 3.5–5.1)
Sodium: 137 mmol/L (ref 135–145)

## 2021-06-12 LAB — CBC
HCT: 26.1 % — ABNORMAL LOW (ref 36.0–46.0)
Hemoglobin: 8 g/dL — ABNORMAL LOW (ref 12.0–15.0)
MCH: 30.5 pg (ref 26.0–34.0)
MCHC: 30.7 g/dL (ref 30.0–36.0)
MCV: 99.6 fL (ref 80.0–100.0)
Platelets: 232 10*3/uL (ref 150–400)
RBC: 2.62 MIL/uL — ABNORMAL LOW (ref 3.87–5.11)
RDW: 18.1 % — ABNORMAL HIGH (ref 11.5–15.5)
WBC: 9.3 10*3/uL (ref 4.0–10.5)
nRBC: 0.3 % — ABNORMAL HIGH (ref 0.0–0.2)

## 2021-06-12 LAB — GLUCOSE, CAPILLARY
Glucose-Capillary: 63 mg/dL — ABNORMAL LOW (ref 70–99)
Glucose-Capillary: 86 mg/dL (ref 70–99)
Glucose-Capillary: 86 mg/dL (ref 70–99)
Glucose-Capillary: 114 mg/dL — ABNORMAL HIGH (ref 70–99)
Glucose-Capillary: 88 mg/dL (ref 70–99)

## 2021-06-12 MED ORDER — INSULIN GLARGINE 100 UNIT/ML ~~LOC~~ SOLN
16.0000 [IU] | Freq: Every day | SUBCUTANEOUS | Status: DC
  Administered 2021-06-12: 16 [IU] via SUBCUTANEOUS
  Filled 2021-06-12 (×2): qty 0.16

## 2021-06-12 MED ORDER — ENOXAPARIN SODIUM 120 MG/0.8ML IJ SOSY
105.0000 mg | PREFILLED_SYRINGE | Freq: Two times a day (BID) | INTRAMUSCULAR | Status: DC
  Administered 2021-06-12 – 2021-06-13 (×2): 105 mg via SUBCUTANEOUS
  Filled 2021-06-12 (×2): qty 0.7

## 2021-06-12 MED ORDER — SIMETHICONE 40 MG/0.6ML PO SUSP
20.0000 mg | Freq: Four times a day (QID) | ORAL | Status: DC
  Administered 2021-06-12 – 2021-06-23 (×40): 20 mg via ORAL
  Filled 2021-06-12 (×46): qty 0.6

## 2021-06-12 MED ORDER — IOHEXOL 300 MG/ML  SOLN
100.0000 mL | Freq: Once | INTRAMUSCULAR | Status: AC | PRN
  Administered 2021-06-12: 100 mL via INTRAVENOUS

## 2021-06-12 NOTE — Progress Notes (Addendum)
Inpatient Diabetes Program Recommendations  AACE/ADA: New Consensus Statement on Inpatient Glycemic Control (2015)  Target Ranges:  Prepandial:   less than 140 mg/dL      Peak postprandial:   less than 180 mg/dL (1-2 hours)      Critically ill patients:  140 - 180 mg/dL   Results for ANUOLUWAPO, MEFFERD VAMPLE (MRN [id number]) as of 06/12/2021 09:12  Ref. Range 06/11/2021 08:28 06/11/2021 11:33 06/11/2021 15:49 06/11/2021 20:55  Glucose-Capillary Latest Ref Range: 70 - 99 mg/dL 54 (L) 75 622 (H)  3 units NOVOLOG  112 (H)    20 units LANTUS   Results for NALANIE, WINIECKI VAMPLE (MRN [id number]) as of 06/12/2021 09:12  Ref. Range 06/12/2021 07:49  Glucose-Capillary Latest Ref Range: 70 - 99 mg/dL 63 (L)    Home DM Meds: Metformin 500 mg BID   Current Orders: Lantus 16 units QHS     Novolog 0-20 units TID     Solumedrol 8 mg Daily    MD- Note Hypoglycemia yesterday AM and again this AM.  Note Lantus dose was reduced for tonight (got 20 units last PM and will get 16 units tonight)  If this trend continues, may consider further reduction of the Lantus to 10 units QHS  May also consider reducing the Novolog SSi to the 0-15 unit (Moderate) scale    --Will follow patient during hospitalization--  Ambrose Finland RN, MSN, CDE Diabetes Coordinator Inpatient Glycemic Control Team Team Pager: [phone] (8a-5p)

## 2021-06-12 NOTE — Progress Notes (Signed)
Physical Therapy Treatment Patient Details Name: [name] MRN: [id number] DOB: [birth year] Today's Date: 06/12/2021    History of Present Illness 70 y.o. female admitted on 05/26/21 for sigmoid diverticulitis with perforation and abscess, IR consulted for surgery vs drain placement and had an unsucessful attempt on 05/28/21.  Bil PE started on IV heparin. S/p CT guided placement of a 10 Fr drainage catheter placement into the diverticular abscess within the midline of the lower abdomen/upper pelvis 6/21. PMH including anemia, CHF, DM, graves disease, HTN, obesity, stroke, seizure, and thyroid disease.    PT Comments    Pt progressing towards her physical therapy goals, exhibiting improved activity tolerance and ambulation distance today. Session focused on seated exercises for BLE strengthening and transfer/gait training. Pt requiring moderate assist for transfers to standing; ambulating x 50 feet with a walker and min assist (chair follow utilized). Pt demonstrates generalized weakness, decreased cardiopulmonary endurance,balance deficits. Pt presents as a high fall risk based on decreased gait speed and safety awareness. In light of this and decreased caregiver support, recommend CIR at discharge.    Follow Up Recommendations  CIR     Equipment Recommendations  Rolling walker with 5" wheels;Wheelchair (measurements PT);Wheelchair cushion (measurements PT);Hospital bed    Recommendations for Other Services       Precautions / Restrictions Precautions Precautions: Fall Precaution Comments: flexiseal, R gravity drain Restrictions Weight Bearing Restrictions: No    Mobility  Bed Mobility Overal bed mobility: Needs Assistance Bed Mobility: Supine to Sit     Supine to sit: HOB elevated;Min guard Sit to supine: Min assist   General bed mobility comments: Increased time managing legs off bed and using bed rails to ascend trunk with HOB significantly elevated, min guard  assist. MinA for LE negotiation back into bed    Transfers Overall transfer level: Needs assistance Equipment used: Rolling walker (2 wheeled) Transfers: Sit to/from Stand Sit to Stand: Mod assist;Min assist         General transfer comment: Cues for hand placement, use of momentum to power up into standing, up to modA to achieve upright  Ambulation/Gait Ambulation/Gait assistance: Min assist;+2 safety/equipment Gait Distance (Feet): 50 Feet Assistive device: Rolling walker (2 wheeled) Gait Pattern/deviations: Decreased stride length;Trunk flexed;Step-through pattern;Shuffle;Narrow base of support Gait velocity: decreased   General Gait Details: Cues for upper trunk control, activity pacing, wider BOS. MinA for balance, chair follow utilized   Social research officer, government Rankin (Stroke Patients Only)       Balance Overall balance assessment: Needs assistance Sitting-balance support: No upper extremity supported;Feet supported Sitting balance-Leahy Scale: Fair     Standing balance support: Bilateral upper extremity supported;During functional activity Standing balance-Leahy Scale: Poor Standing balance comment: reliant on support in standing.                            Cognition Arousal/Alertness: Awake/alert Behavior During Therapy: WFL for tasks assessed/performed Overall Cognitive Status: Impaired/Different from baseline Area of Impairment: Memory;Following commands;Safety/judgement;Problem solving;Attention                   Current Attention Level: Selective Memory: Decreased short-term memory Following Commands: Follows one step commands with increased time;Follows one step commands consistently;Follows multi-step commands inconsistently Safety/Judgement: Decreased awareness of safety Awareness: Intellectual Problem Solving: Difficulty sequencing;Requires verbal cues;Requires tactile cues;Slow  processing General Comments: Needs cues for recall and  carryover      Exercises General Exercises - Lower Extremity Long Arc Quad: Both;10 reps;Seated Hip Flexion/Marching: Both;5 reps;Seated    General Comments        Pertinent Vitals/Pain Pain Assessment: Faces Faces Pain Scale: Hurts a little bit Pain Location: abdomen Pain Descriptors / Indicators: Discomfort Pain Intervention(s): Monitored during session    Home Living                      Prior Function            PT Goals (current goals can now be found in the care plan section) Acute Rehab PT Goals Patient Stated Goal: to improve PT Goal Formulation: With patient Time For Goal Achievement: 06/13/21 Potential to Achieve Goals: Good Progress towards PT goals: Progressing toward goals    Frequency    Min 4X/week      PT Plan Current plan remains appropriate    Co-evaluation              AM-PAC PT "6 Clicks" Mobility   Outcome Measure  Help needed turning from your back to your side while in a flat bed without using bedrails?: A Little Help needed moving from lying on your back to sitting on the side of a flat bed without using bedrails?: A Little Help needed moving to and from a bed to a chair (including a wheelchair)?: A Little Help needed standing up from a chair using your arms (e.g., wheelchair or bedside chair)?: A Lot Help needed to walk in hospital room?: A Little Help needed climbing 3-5 steps with a railing? : Total 6 Click Score: 15    End of Session Equipment Utilized During Treatment: Gait belt Activity Tolerance: Patient tolerated treatment well Patient left: with call bell/phone within reach;in chair;with chair alarm set;with family/visitor present Nurse Communication: Mobility status PT Visit Diagnosis: Muscle weakness (generalized) (M62.81);Difficulty in walking, not elsewhere classified (R26.2);Unsteadiness on feet (R26.81);Other abnormalities of gait and mobility  (R26.89)     Time: 6333-5456 PT Time Calculation (min) (ACUTE ONLY): 9 min  Charges:  $Gait Training: 8-22 mins                     Lillia Pauls, PT, DPT Acute Rehabilitation Services Pager [phone] Office [phone]    [name] 06/12/2021, 4:48 PM

## 2021-06-12 NOTE — Progress Notes (Addendum)
Referring Physician(s): Dr Deland Pretty   Supervising Physician: Ruthann Cancer  Patient Status:  [facility] - In-pt  Chief Complaint:  Diverticular abscess  Subjective:  Drain placed in IR 06/06/21 OP still is thin and brown color OP is slowing Denies pain; flushes easily Afeb    Allergies: Gluten meal  Medications: Prior to Admission medications   Medication Sig Start Date End Date Taking? Authorizing Provider  albuterol (VENTOLIN HFA) 108 (90 Base) MCG/ACT inhaler Inhale 2 puffs into the lungs every 6 (six) hours as needed for wheezing or shortness of breath.   Yes [provider]  amLODipine (NORVASC) 5 MG tablet Take 1 tablet (5 mg total) by mouth daily. 10/04/20  Yes Nahser, Wonda Cheng, MD  carvedilol (COREG) 25 MG tablet Take 1 tablet (25 mg total) by mouth 2 (two) times daily with a meal. 12/05/12  Yes Nahser, Wonda Cheng, MD  esomeprazole (NEXIUM) 20 MG capsule Take 1 capsule by mouth daily.   Yes [provider]  furosemide (LASIX) 20 MG tablet Take 1 tablet (20 mg total) by mouth daily. 05/22/21  Yes Hans Eden, NP  levothyroxine (SYNTHROID) 112 MCG tablet Take 1 tablet by mouth Monday through Saturday and 1/2 tablet on Sunday. 11/08/20  Yes Elayne Snare, MD  metFORMIN (GLUCOPHAGE) 500 MG tablet Take 500 mg by mouth 2 (two) times daily. 08/23/20  Yes [provider]  OVER THE COUNTER MEDICATION Take 1 tablet by mouth daily. Medication: Vitamin b12 3000 mcg   Yes [provider]  potassium chloride (KLOR-CON) 10 MEQ tablet Take 2 tablets (20 mEq total) by mouth daily. 11/07/20  Yes Nahser, Wonda Cheng, MD  predniSONE (DELTASONE) 20 MG tablet Take 60 mg by mouth daily with breakfast.   Yes [provider]  rosuvastatin (CRESTOR) 5 MG tablet Take 1 tablet (5 mg total) by mouth daily. 11/08/20  Yes Nahser, Wonda Cheng, MD  Blood Glucose Monitoring Suppl (ONETOUCH VERIO REFLECT) w/Device KIT 1 each by Other route as directed. 08/10/20    [provider]  Fluticasone-Salmeterol (ADVAIR) 250-50 MCG/DOSE AEPB Inhale 1 puff into the lungs daily as needed for allergies.    [provider]  glucose blood (ONETOUCH VERIO) test strip Use as intstructed to check blood sugar once a day Dx Code E11.9 02/18/21   Elayne Snare, MD  Lancets [facility] ULTRASOFT) lancets Use as instructed 10/13/20   Elayne Snare, MD  meclizine (ANTIVERT) 25 MG tablet Take 1 tablet by mouth daily as needed for dizziness.    [provider]     Vital Signs: BP 105/75 (BP Location: Left Arm)   Pulse 80   Temp 97.6 F (36.4 C) (Oral)   Resp 18   Ht 5' 2"  (1.575 m)   Wt 240 lb 8.4 oz (109.1 kg)   SpO2 99%   BMI 43.99 kg/m   Physical Exam Vitals reviewed.  Skin:    General: Skin is warm.     Comments: Site of RLQ drain is c/d/I NT no sign of infection No bleeding OP is brown thin fluid  For CT today  Culture FEW PSEUDOMONAS AERUGINOSA  WITHIN MIXED ORGANISMS  MODERATE BACTEROIDES THETAIOTAOMICRON  BETA LACTAMASE POSITIVE  Performed at [facility], [place] [address], [place], [place] [address]  Report Status 06/11/2021 FINAL  Organism ID, Bacteria PSEUDOMONAS AERUGINOSA      Neurological:     Mental Status: She is alert.    Imaging: No results found.  Labs:  CBC:  Recent Labs    06/09/21 0458 06/10/21 0244 06/11/21 0246 06/12/21 0341  WBC 12.1* 10.7* 10.1 9.3  HGB 8.6* 8.3* 8.5* 8.0*  HCT 27.2* 25.9* 26.9* 26.1*  PLT 249 234 252 232    COAGS: Recent Labs    06/06/21 0230  INR 1.1    BMP: Recent Labs    11/07/20 1058 03/26/21 1927 06/09/21 0458 06/10/21 0244 06/11/21 0246 06/12/21 0341  NA 141   < > 137 133* 137 137  K 4.1   < > 3.9 3.9 3.5 3.7  CL 103   < > 99 97* 103 100  CO2 24   < > 29 28 28 29   GLUCOSE 118*   < > 76 71 68* 70  BUN 13   < > 15 13 10 8   CALCIUM 9.9   < > 9.1 8.9 8.7* 8.8*  CREATININE 1.06*   < > 0.80 0.98 1.05* 1.06*  GFRNONAA 53*   < > >60 >60 57* 57*  GFRAA  61  --   --   --   --   --    < > = values in this interval not displayed.    LIVER FUNCTION TESTS: Recent Labs    06/05/21 0500 06/06/21 0230 06/08/21 0459 06/11/21 0246  BILITOT 0.5 0.6 <0.1* 0.5  AST 24 24 21 20   ALT 25 28 24 26   ALKPHOS 93 93 77 82  PROT 4.4* 4.5* 4.4* 4.8*  ALBUMIN 2.0* 2.0* 2.0* 2.2*    Assessment and Plan:  Diverticular abscess  Drain placed in IR 06/06/21 OP slowing; flushes well Afeb For CT today Will need drain injection to r/o fistula to bowel before removal  Electronically Signed: Lavonia Drafts, PA-C 06/12/2021, 10:31 AM   I spent a total of 15 Minutes at the the patient's bedside AND on the patient's hospital floor or unit, greater than 50% of which was counseling/coordinating care for pelvic abscess

## 2021-06-12 NOTE — Progress Notes (Signed)
Progress Note  11 Days Post-Op  Subjective: Patient eating lunch. Denies significant abdominal pain or nausea. Having bowel function. Daughters at bedside.   Objective: Vital signs in last 24 hours: Temp:  [97.6 F (36.4 C)-98.7 F (37.1 C)] 97.8 F (36.6 C) (06/27 1213) Pulse Rate:  [71-93] 71 (06/27 1213) Resp:  [14-20] 20 (06/27 1213) BP: (93-122)/(61-81) 121/76 (06/27 1213) SpO2:  [93 %-99 %] 93 % (06/27 1213) Last BM Date: 06/12/21  Intake/Output from previous day: 06/26 0701 - 06/27 0700 In: 935 [P.O.:840; IV Piggyback:50] Out: 615 [Urine:600; Drains:15] Intake/Output this shift: Total I/O In: 270 [P.O.:240; Other:30] Out: 500 [Urine:500]  PE: General: pleasant, WD, female who is sitting up in bed in NAD HEENT: head is normocephalic, atraumatic. Mouth is pink and moist Heart:  Palpable radial pulses bilaterally Lungs: Respiratory effort nonlabored Abd: soft, NT, ND, +BS, drain with minimal serous fluid MS: all 4 extremities are symmetrical with no cyanosis, clubbing, or edema. Skin: warm and dry with no masses, lesions, or rashes Psych: A&Ox3 with an appropriate affect.   Lab Results:  Recent Labs    06/11/21 0246 06/12/21 0341  WBC 10.1 9.3  HGB 8.5* 8.0*  HCT 26.9* 26.1*  PLT 252 232   BMET Recent Labs    06/11/21 0246 06/12/21 0341  NA 137 137  K 3.5 3.7  CL 103 100  CO2 28 29  GLUCOSE 68* 70  BUN 10 8  CREATININE 1.05* 1.06*  CALCIUM 8.7* 8.8*   PT/INR No results for input(s): LABPROT, INR in the last 72 hours. CMP     Component Value Date/Time   NA 137 06/12/2021 0341   NA 141 11/07/2020 1058   K 3.7 06/12/2021 0341   CL 100 06/12/2021 0341   CO2 29 06/12/2021 0341   GLUCOSE 70 06/12/2021 0341   BUN 8 06/12/2021 0341   BUN 13 11/07/2020 1058   CREATININE 1.06 (H) 06/12/2021 0341   CALCIUM 8.8 (L) 06/12/2021 0341   PROT 4.8 (L) 06/11/2021 0246   PROT 6.5 11/07/2020 1058   ALBUMIN 2.2 (L) 06/11/2021 0246   ALBUMIN 4.3  11/07/2020 1058   AST 20 06/11/2021 0246   ALT 26 06/11/2021 0246   ALKPHOS 82 06/11/2021 0246   BILITOT 0.5 06/11/2021 0246   BILITOT 0.6 11/07/2020 1058   GFRNONAA 57 (L) 06/12/2021 0341   GFRAA 61 11/07/2020 1058   Lipase     Component Value Date/Time   LIPASE 19 05/26/2021 1601       Studies/Results: No results found.  Anti-infectives: Anti-infectives (From admission, onward)    Start     Dose/Rate Route Frequency Ordered Stop   05/27/21 0500  piperacillin-tazobactam (ZOSYN) IVPB 3.375 g        3.375 g 12.5 mL/hr over 240 Minutes Intravenous Every 8 hours 05/26/21 2126     05/26/21 2015  ceFEPIme (MAXIPIME) 2 g in sodium chloride 0.9 % 100 mL IVPB       See Hyperspace for full Linked Orders Report.   2 g 200 mL/hr over 30 Minutes Intravenous  Once 05/26/21 2004 05/26/21 2107   05/26/21 2015  metroNIDAZOLE (FLAGYL) IVPB 500 mg       See Hyperspace for full Linked Orders Report.   500 mg 100 mL/hr over 60 Minutes Intravenous  Once 05/26/21 2004 05/26/21 2135        Assessment/Plan  45F Diverticulitis with intra-abdominal abscess (9x5 cm) - afebrile, no leukocytosis is on chronic steroids as well with wean  attempts underway - Continue drain  -  culture positive for moderate enterococcus faecalis, few gram neg rods. Follow  - ID is following and recommending repeat CT A/P - not done yet - No emergent surgical needs at this juncture - OOB/mobilize with therapies   STABLE FOR DISCHARGE TO CIR FROM A SURGICAL PERSPECTIVE   FEN: soft  ID: Zosyn 6/10>> VTE: SCD's Foley: none, external cath Dispo: SDU   - Below per primary service - Bilateral PE-heparin IV held due to hgb requiring transfusion. S/p IVCF 6/16 by IR Melena - EGD 6/16 normal esophagus, erythematous mucosa in the antrum, duodenitis but no active bleeding. GI signed off. Acute respiratory failure with hypoxia Thombocytopenia Chronic diastolic heart failure CKD IIIa HTN Graves dz DM2 Morbid  obesity    LOS: 17 days    [name], [facility] 06/12/2021, 12:58 PM Please see Amion for pager number during day hours 7:00am-4:30pm

## 2021-06-12 NOTE — Progress Notes (Signed)
ANTICOAGULATION CONSULT NOTE - Initial Consult  Pharmacy Consult for Enoxaparin Indication: pulmonary embolus and DVT  Allergies  Allergen Reactions   Gluten Meal     Celiac    Patient Measurements: Height: 5\' 2"  (157.5 cm) Weight: 109.1 kg (240 lb 8.4 oz) IBW/kg (Calculated) : 50.1  Vital Signs: Temp: 97.8 F (36.6 C) (06/27 1213) Temp Source: Oral (06/27 1213) BP: 121/76 (06/27 1213) Pulse Rate: 71 (06/27 1213)  Labs: Recent Labs    06/10/21 0244 06/11/21 0246 06/12/21 0341  HGB 8.3* 8.5* 8.0*  HCT 25.9* 26.9* 26.1*  PLT 234 252 232  CREATININE 0.98 1.05* 1.06*    Estimated Creatinine Clearance: 57.5 mL/min (A) (by C-G formula based on SCr of 1.06 mg/dL (H)).   Medical History: Past Medical History:  Diagnosis Date   Anemia    Celiac disease    CHF (congestive heart failure) (HCC)    Depression    Diabetes mellitus without complication (HCC)    Edema    lower extremities   Emphysema of lung (HCC)    Enlarged heart    begining stage   Graves disease    Headache(784.0) 05/18/2013   Heart murmur    Hematochezia    Hemorrhoid    Hyperlipidemia    Hypertension    Migraine    Obese    OSA (obstructive sleep apnea)    Seizures (HCC)    Sleep apnea    Stroke [facility])    left P-O ICH 2007   Thyroid disease    hypothyroidism   Ulcer    Vitamin D deficiency    Assessment: 70 yo female presented on 05/26/2021 with abdominal pain found to have sigmoid diverticulitis with perforation and abscess. Hospital course complicated by PE and DVT s/p IVC filter placement and acute blood loss anemia due to GIB. Patient has been receiving enoxaparin 40mg  q24h SQ for DVT prophylaxis. Last dose ~1800 on 6/26. Pharmacy consulted to dose enoxaparin for treatment dosing of DVT and PE. Hgb 8.0. Plt wnl. No bleeding noted. Updated weight obtained.   Goal of Therapy:  Anti-Xa level 0.6-1 units/ml 4hrs after LMWH dose given Monitor platelets by anticoagulation protocol: Yes    Plan:  Start enoxaparin 105mg  SQ q12h Watch for s/s of bleeding  Transition to oral anticoagulant if stable on treatment dose enoxaparin  , PharmD Clinical Pharmacist  06/12/2021,3:54 PM

## 2021-06-12 NOTE — Progress Notes (Signed)
Inpatient Rehab Admissions Coordinator:   Notified by [facility] this AM that request for CIR has been denied.  Dr. Riley Kill completed peer to peer with medical director at [facility], and states that the denial was upheld.  I let the pt and her family know, and they would like to complete and expedited appeal, which I can begin as soon as I hear back from [place].    Estill Dooms, PT, DPT Admissions Coordinator [phone] 06/12/21  1:32 PM

## 2021-06-12 NOTE — Progress Notes (Addendum)
[facility]  Date of Admission:  05/26/2021     CC: Complicated diverticulitis/intraabd abscess  Lines: Peripheral iv's  Abx: 6/11-c Pip-tazo  ASSESSMENT: Intraabd abscess Complicated diverticulitis Gib -- egd no source of bleeding Dvt/pe (new -- bilateral PE); s/p ivc filter 6/16 Acute hypoxic resp failure due to PE, improved/resolved Dm2 Obesity Hx Grave's disease with orbitopathy, on steroid taper for this Hx Grave's disease treatment since with hypothyroidism HFpEF   Patient had IR evaluation 6/12 and there was no safe window for drainage. She had repeat ct on 6/18 which showed increased abscess size. She has been started on tpn  Had gib but resolved. Previous egd this admission no active source of bleeding. Ivc filter placed 6/16. Heparin stopped 6/15  Surgery is also comanaging and given comorbid condition with dvt/pe and steroid use, currently would like to avoid invasive management  S/p IR placement percutaneous drain 6/21. Cx growing pansenstive pseudomonas. Rather unexpected  Clinically improving.   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 (HCC)   Moderate persistent asthma, uncomplicated   Seizure (HCC)   Bilateral pulmonary embolism (HCC)   Allergies  Allergen Reactions   Gluten Meal     Celiac    Scheduled Meds:  Chlorhexidine Gluconate Cloth  6 each Topical Daily   enoxaparin (LOVENOX) injection  40 mg Subcutaneous Q24H   feeding supplement  237 mL Oral BID BM   insulin aspart  0-20 Units Subcutaneous TID WC   insulin glargine  16 Units  Subcutaneous QHS   levothyroxine  112 mcg Oral Q0600   methylPREDNISolone (SOLU-MEDROL) injection  8 mg Intravenous Daily   pantoprazole  40 mg Oral BID   saccharomyces boulardii  250 mg Oral BID   sodium chloride flush  10-40 mL Intracatheter Q12H   sodium chloride flush  5 mL Intracatheter Q8H   Continuous Infusions:  sodium chloride     sodium chloride     piperacillin-tazobactam (ZOSYN)  IV 3.375 g (06/12/21 1236)   PRN Meds:.Gerhardt's butt cream, liver oil-zinc oxide, morphine injection, ondansetron (ZOFRAN) IV, sodium chloride flush   SUBJECTIVE: Doing well Eating well No abd pain No n/v/diarrhea No f/c   Review of Systems: ROS All other ROS was negative, except mentioned above     OBJECTIVE: Vitals:   06/12/21 0021 06/12/21 0318 06/12/21 0753 06/12/21 1213  BP: 105/73 115/77 105/75 121/76  Pulse: 85 75 80 71  Resp: 18 18 18 20   Temp: 98.6 F (37 C) 98.7 F (37.1 C) 97.6 F (36.4 C) 97.8 F (36.6 C)  TempSrc: Oral Oral Oral Oral  SpO2: 97% 99% 99% 93%  Weight:      Height:       Body mass index is 43.99 kg/m.  Physical Exam General/constitutional: no distress, pleasant HEENT: Normocephalic, PER, Conj Clear, EOMI, Oropharynx clear Neck supple CV: rrr no mrg Lungs: clear to auscultation, normal respiratory effort Abd: Soft, Nontender -- drain murky fluid Ext: no edema Skin: No Rash Neuro: nonfocal MSK: no peripheral joint swelling/tenderness/warmth; back spines nontender     Lab Results Lab Results  Component Value Date  WBC 9.3 06/12/2021   HGB 8.0 (L) 06/12/2021   HCT 26.1 (L) 06/12/2021   MCV 99.6 06/12/2021   PLT 232 06/12/2021    Lab Results  Component Value Date   CREATININE 1.06 (H) 06/12/2021   BUN 8 06/12/2021   NA 137 06/12/2021   K 3.7 06/12/2021   CL 100 06/12/2021   CO2 29 06/12/2021    Lab Results  Component Value Date   ALT 26 06/11/2021   AST 20 06/11/2021   ALKPHOS 82 06/11/2021   BILITOT 0.5 06/11/2021       Microbiology: Recent Results (from the past 240 hour(s))  Aerobic/Anaerobic Culture w Gram Stain (surgical/deep wound)     Status: None   Collection Time: 06/06/21 10:36 AM   Specimen: Abscess  Result Value Ref Range Status   Specimen Description ABSCESS  Final   Special Requests DRAIN  Final   Gram Stain   Final    ABUNDANT WBC PRESENT, PREDOMINANTLY PMN RARE GRAM POSITIVE COCCI IN PAIRS    Culture   Final    FEW PSEUDOMONAS AERUGINOSA WITHIN MIXED ORGANISMS MODERATE BACTEROIDES THETAIOTAOMICRON BETA LACTAMASE POSITIVE Performed at [facility], [address] [place], [place] [address]    Report Status 06/11/2021 FINAL  Final   Organism ID, Bacteria PSEUDOMONAS AERUGINOSA  Final      Susceptibility   Pseudomonas aeruginosa - MIC*    CEFTAZIDIME 4 SENSITIVE Sensitive     CIPROFLOXACIN <=0.25 SENSITIVE Sensitive     GENTAMICIN <=1 SENSITIVE Sensitive     IMIPENEM 1 SENSITIVE Sensitive     PIP/TAZO <=4 SENSITIVE Sensitive     CEFEPIME 2 SENSITIVE Sensitive     * FEW PSEUDOMONAS AERUGINOSA     Serology:   Imaging: If present, new imagings (plain films, ct scans, and mri) have been personally visualized and interpreted; radiology reports have been reviewed. Decision making incorporated into the Impression / Recommendations.  6/18 abd ct Stomach/Bowel: Rectal tube is noted within the rectum. The sigmoid colon again demonstrates diverticulosis although the degree of inflammatory change has improved in the interval from the prior exam. The more proximal colon is unremarkable. Appendix is well visualized and within normal limits. Extraluminal air is noted surrounding the colon in the pelvis slightly increased in the interval from the prior exam. Additionally a large air-fluid collection is again identified interposed between the small bowel loops and uterus adjacent to the sigmoid colon. This now measures 10.5 x 6.8 cm in greatest transverse and AP dimensions  and extends for at least 6.7 cm in craniocaudad projection (increased from previous). This is again consistent with a large abscess. The abscess is again draped with multiple loops of small bowel precluding percutaneous drainage. The surrounding small bowel loops again demonstrates some mild inflammatory thickening.  Raymondo Band, MD [facility] [phone] pager    06/12/2021, 2:09 PM

## 2021-06-12 NOTE — Progress Notes (Addendum)
PROGRESS NOTE    [name]  NOM:[id number] DOB: 1951/10/01 DOA: 05/26/2021 PCP: Ollen Bowl, MD  Chief Complaint  Patient presents with   Abdominal Pain    Brief Narrative: As well as admitted to the hospital with a perforated diverticulitis complicated by intra-abdominal abscess, pulmonary embolism and acute blood loss anemia due to lower GI bleed.   Subjective: No acute overnight events reported by nursing staff.  Seen and examined at bedside today.  Sitting up in bed  Tolerating p.o. diet.  Pain is well controlled.  Passing flatus. Asking for something for excessive gas. No longer on TPN.    No fevers or chills.  Drains being managed by surgery. Daughters present at bedside. Repeat CT A/P today. IR and ID following as well in consultation.   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 (no sepsis) No abdominal pain and tolerating po with no nausea or vomiting.  Abscess drain culture growing Pseudomonas and Bacteroides. Pan sensitive.  ID following. Will defer to them for de-escalation based off S/S. Abdominal abscess drain has decreasing output.  IR following.   Continue antibiotic therapy with Zosyn # 16 Tolerating po well, no longer on TPN. Continue to encourage out of bed to the chair tid with meals.  Plan CIR for rehab and if denied then SNF   2. Acute bilateral pulmonary embolism/ acute hypoxemic respiratory failure. acute deep vein thrombosis involving the left popliteal vein and left posterior tibial arteries. Sp IVC filter. sp PRBC transfusion   Hemoglobin stable  No signs of bleeding. Discussed with surgery and okay with full  dose anticoagulation now. Will trial on full dose lovenox and if not signs of overt bleeding can transition to DOAC   3. Acute blood loss anemia, due to GI bleeding (lower).  Sp PRBC 1 unit PRBC transfusion Upper endoscopy with normal esophagus and no gastric or duodenal ulcers on 06/01/21/   Hemoglobin stable    4. Uncontrolled T2DM Hyper and hypoglycemia.   Sugars on the low side. Decrease Lantus from 20 units to 16 units daily and continue sliding scale insulin with meals 3 times a day Patient is tolerating po well.    5. CKD stage 3a  stable    6. Heart failure with diastolic dysfunction No signs of acute  decompensation   7. Hypothyroidism: Follows with [facility] endocrine on daily. Discussed with daughters and she missed a week of medication prior to hospitalization. TSH elevated at 21. Keep current dose and close follow up with endocrine on d/c. Last saw them early June   8. Seizures. No clinical seizures.     Patient continue to be at high risk   Status is: Inpatient   Remains inpatient appropriate because:Inpatient level of care appropriate due to severity of illness   Dispo: The patient is from: Home              Anticipated d/c is to: CIR vs SNF              Patient currently is not medically stable to d/c.              Difficult to place patient No     DVT prophylaxis:  Enoxaparin   Code Status:              full  Family Communication:       No family present at bedside     Consultants:  Surgery ID IR GI    Procedures:  Upper endoscopy Abdominal abscess drain    Antimicrobials:  Zosyn       Objective: Vitals:   06/12/21 0021 06/12/21 0318 06/12/21 0753 06/12/21 1213  BP: 105/73 115/77 105/75 121/76  Pulse: 85 75 80 71  Resp: 18 18 18 20   Temp: 98.6 F (37 C) 98.7 F (37.1 C) 97.6 F (36.4 C) 97.8 F (36.6 C)  TempSrc: Oral Oral Oral Oral  SpO2: 97% 99% 99% 93%  Weight:      Height:        Intake/Output Summary (Last 24 hours)  at 06/12/2021 1514 Last data filed at 06/12/2021 1109 Gross per 24 hour  Intake 365 ml  Output 1115 ml  Net -750 ml    Filed Weights   05/27/21 0041  Weight: 109.1 kg    Examination:  General exam: Appears calm and comfortable.  Deconditioned. Exophthalmos  Respiratory system: Clear to auscultation. Respiratory effort normal. Cardiovascular system: S1 & S2 heard, RRR. No JVD, murmurs, rubs, gallops or clicks. No pedal edema. Gastrointestinal system: Abdomen is nondistended, soft and nontender. No organomegaly or masses felt. Normal bowel sounds heard.  Abdominal drain in place. Central nervous system: Alert and oriented. No focal neurological deficits. Extremities: Symmetric 5 x 5 power. Skin: No rashes, lesions or ulcers Psychiatry: Judgement and insight appear normal. Mood & affect appropriate.     Data Reviewed: I have personally reviewed following labs and imaging studies  CBC: Recent Labs  Lab 06/08/21 0459 06/09/21 0458 06/10/21 0244 06/11/21 0246 06/12/21 0341  WBC 10.8* 12.1* 10.7* 10.1 9.3  NEUTROABS 7.4 7.8*  --   --   --   HGB 7.8* 8.6* 8.3* 8.5* 8.0*  HCT 25.3* 27.2* 25.9* 26.9* 26.1*  MCV 98.8 98.2 96.6 97.1 99.6  PLT 213 249 234 252 232     Basic Metabolic Panel: Recent Labs  Lab 06/06/21 0230 06/07/21 0350 06/08/21 0459 06/09/21 0458 06/10/21 0244 06/11/21 0246 06/12/21 0341  NA 138 138 135 137 133* 137 137  K 3.9 4.2 4.3 3.9 3.9 3.5 3.7  CL 99 100 103 99 97* 103 100  CO2 27 30 27 29 28 28 29   GLUCOSE 123* 136* 145* 76 71 68* 70  BUN 10 18 17 15 13 10 8   CREATININE 0.65 0.78 0.76 0.80 0.98 1.05* 1.06*  CALCIUM 8.9 8.9 8.5* 9.1 8.9 8.7* 8.8*  MG 1.8 1.9 1.9  --   --   --   --   PHOS 3.1 2.8 3.1  --   --   --   --      GFR: Estimated Creatinine Clearance: 57.5 mL/min (A) (by C-G formula based on SCr of 1.06 mg/dL (H)).  Liver Function Tests: Recent Labs  Lab 06/06/21 0230 06/08/21 0459 06/11/21 0246  AST 24 21 20   ALT 28 24 26    ALKPHOS 93 77 82  BILITOT 0.6 <0.1* 0.5  PROT 4.5* 4.4* 4.8*  ALBUMIN 2.0* 2.0* 2.2*     CBG: Recent Labs  Lab 06/11/21 1549 06/11/21 2055 06/12/21 0749 06/12/21 0837 06/12/21 1210  GLUCAP 141* 112* 63* 86 86      Recent Results (from the past 240 hour(s))  Aerobic/Anaerobic Culture w Gram Stain (surgical/deep wound)  Status: None   Collection Time: 06/06/21 10:36 AM   Specimen: Abscess  Result Value Ref Range Status   Specimen Description ABSCESS  Final   Special Requests DRAIN  Final   Gram Stain   Final    ABUNDANT WBC PRESENT, PREDOMINANTLY PMN RARE GRAM POSITIVE COCCI IN PAIRS    Culture   Final    FEW PSEUDOMONAS AERUGINOSA WITHIN MIXED ORGANISMS MODERATE BACTEROIDES THETAIOTAOMICRON BETA LACTAMASE POSITIVE Performed at [facility], [address] [place], [place] [address]    Report Status 06/11/2021 FINAL  Final   Organism ID, Bacteria PSEUDOMONAS AERUGINOSA  Final      Susceptibility   Pseudomonas aeruginosa - MIC*    CEFTAZIDIME 4 SENSITIVE Sensitive     CIPROFLOXACIN <=0.25 SENSITIVE Sensitive     GENTAMICIN <=1 SENSITIVE Sensitive     IMIPENEM 1 SENSITIVE Sensitive     PIP/TAZO <=4 SENSITIVE Sensitive     CEFEPIME 2 SENSITIVE Sensitive     * FEW PSEUDOMONAS AERUGINOSA          Radiology Studies: No results found.      Scheduled Meds:  Chlorhexidine Gluconate Cloth  6 each Topical Daily   enoxaparin (LOVENOX) injection  40 mg Subcutaneous Q24H   feeding supplement  237 mL Oral BID BM   insulin aspart  0-20 Units Subcutaneous TID WC   insulin glargine  16 Units Subcutaneous QHS   levothyroxine  112 mcg Oral Q0600   methylPREDNISolone (SOLU-MEDROL) injection  8 mg Intravenous Daily   pantoprazole  40 mg Oral BID   saccharomyces boulardii  250 mg Oral BID   simethicone  20 mg Oral QID   sodium chloride flush  10-40 mL Intracatheter Q12H   sodium chloride flush  5 mL Intracatheter Q8H   Continuous Infusions:  sodium  chloride     sodium chloride     piperacillin-tazobactam (ZOSYN)  IV 3.375 g (06/12/21 1236)     LOS: 17 days    Time spent: 35 minutes    Verdia Kuba, MD [facility]   To contact the attending provider between 7A-7P or the covering provider during after hours 7P-7A, please log into the web site [email or web address] and access using universal August password for that web site. If you do not have the password, please call the hospital operator.  06/12/2021, 3:14 PM

## 2021-06-13 ENCOUNTER — Inpatient Hospital Stay (HOSPITAL_COMMUNITY): Payer: Medicare HMO

## 2021-06-13 LAB — CBC WITH DIFFERENTIAL/PLATELET
Abs Immature Granulocytes: 0.06 10*3/uL (ref 0.00–0.07)
Abs Immature Granulocytes: 0.1 10*3/uL — ABNORMAL HIGH (ref 0.00–0.07)
Basophils Absolute: 0.1 10*3/uL (ref 0.0–0.1)
Basophils Absolute: 0.1 10*3/uL (ref 0.0–0.1)
Basophils Relative: 1 %
Basophils Relative: 1 %
Eosinophils Absolute: 0 10*3/uL (ref 0.0–0.5)
Eosinophils Absolute: 0 10*3/uL (ref 0.0–0.5)
Eosinophils Relative: 0 %
Eosinophils Relative: 0 %
HCT: 28.4 % — ABNORMAL LOW (ref 36.0–46.0)
HCT: 28.5 % — ABNORMAL LOW (ref 36.0–46.0)
Hemoglobin: 8.8 g/dL — ABNORMAL LOW (ref 12.0–15.0)
Hemoglobin: 8.8 g/dL — ABNORMAL LOW (ref 12.0–15.0)
Immature Granulocytes: 1 %
Immature Granulocytes: 1 %
Lymphocytes Relative: 16 %
Lymphocytes Relative: 10 %
Lymphs Abs: 1.6 10*3/uL (ref 0.7–4.0)
Lymphs Abs: 1.2 10*3/uL (ref 0.7–4.0)
MCH: 31.1 pg (ref 26.0–34.0)
MCH: 31 pg (ref 26.0–34.0)
MCHC: 31 g/dL (ref 30.0–36.0)
MCHC: 30.9 g/dL (ref 30.0–36.0)
MCV: 100.4 fL — ABNORMAL HIGH (ref 80.0–100.0)
MCV: 100.4 fL — ABNORMAL HIGH (ref 80.0–100.0)
Monocytes Absolute: 1 10*3/uL (ref 0.1–1.0)
Monocytes Absolute: 0.7 10*3/uL (ref 0.1–1.0)
Monocytes Relative: 10 %
Monocytes Relative: 6 %
Neutro Abs: 7.3 10*3/uL (ref 1.7–7.7)
Neutro Abs: 10.4 10*3/uL — ABNORMAL HIGH (ref 1.7–7.7)
Neutrophils Relative %: 72 %
Neutrophils Relative %: 82 %
Platelets: 242 10*3/uL (ref 150–400)
Platelets: 264 10*3/uL (ref 150–400)
RBC: 2.83 MIL/uL — ABNORMAL LOW (ref 3.87–5.11)
RBC: 2.84 MIL/uL — ABNORMAL LOW (ref 3.87–5.11)
RDW: 18.5 % — ABNORMAL HIGH (ref 11.5–15.5)
RDW: 18.4 % — ABNORMAL HIGH (ref 11.5–15.5)
WBC: 10 10*3/uL (ref 4.0–10.5)
WBC: 12.5 10*3/uL — ABNORMAL HIGH (ref 4.0–10.5)
nRBC: 0.2 % (ref 0.0–0.2)
nRBC: 0.2 % (ref 0.0–0.2)

## 2021-06-13 LAB — CBC
HCT: 24.4 % — ABNORMAL LOW (ref 36.0–46.0)
HCT: 22.6 % — ABNORMAL LOW (ref 36.0–46.0)
Hemoglobin: 7.7 g/dL — ABNORMAL LOW (ref 12.0–15.0)
Hemoglobin: 7 g/dL — ABNORMAL LOW (ref 12.0–15.0)
MCH: 31.7 pg (ref 26.0–34.0)
MCH: 31.5 pg (ref 26.0–34.0)
MCHC: 31.6 g/dL (ref 30.0–36.0)
MCHC: 31 g/dL (ref 30.0–36.0)
MCV: 100.4 fL — ABNORMAL HIGH (ref 80.0–100.0)
MCV: 101.8 fL — ABNORMAL HIGH (ref 80.0–100.0)
Platelets: 253 10*3/uL (ref 150–400)
Platelets: 212 10*3/uL (ref 150–400)
RBC: 2.43 MIL/uL — ABNORMAL LOW (ref 3.87–5.11)
RBC: 2.22 MIL/uL — ABNORMAL LOW (ref 3.87–5.11)
RDW: 18.4 % — ABNORMAL HIGH (ref 11.5–15.5)
RDW: 18.7 % — ABNORMAL HIGH (ref 11.5–15.5)
WBC: 10.8 10*3/uL — ABNORMAL HIGH (ref 4.0–10.5)
WBC: 10.9 10*3/uL — ABNORMAL HIGH (ref 4.0–10.5)
nRBC: 0.2 % (ref 0.0–0.2)
nRBC: 0.5 % — ABNORMAL HIGH (ref 0.0–0.2)

## 2021-06-13 LAB — GLUCOSE, CAPILLARY
Glucose-Capillary: 70 mg/dL (ref 70–99)
Glucose-Capillary: 75 mg/dL (ref 70–99)
Glucose-Capillary: 86 mg/dL (ref 70–99)
Glucose-Capillary: 104 mg/dL — ABNORMAL HIGH (ref 70–99)

## 2021-06-13 LAB — COMPREHENSIVE METABOLIC PANEL
ALT: 23 U/L (ref 0–44)
AST: 17 U/L (ref 15–41)
Albumin: 2.4 g/dL — ABNORMAL LOW (ref 3.5–5.0)
Alkaline Phosphatase: 69 U/L (ref 38–126)
Anion gap: 14 (ref 5–15)
BUN: 6 mg/dL — ABNORMAL LOW (ref 8–23)
CO2: 25 mmol/L (ref 22–32)
Calcium: 8.7 mg/dL — ABNORMAL LOW (ref 8.9–10.3)
Chloride: 100 mmol/L (ref 98–111)
Creatinine, Ser: 0.9 mg/dL (ref 0.44–1.00)
GFR, Estimated: >60 mL/min (ref >60)
Glucose, Bld: 68 mg/dL — ABNORMAL LOW (ref 70–99)
Potassium: 3 mmol/L — ABNORMAL LOW (ref 3.5–5.1)
Sodium: 139 mmol/L (ref 135–145)
Total Bilirubin: 0.5 mg/dL (ref 0.3–1.2)
Total Protein: 4.9 g/dL — ABNORMAL LOW (ref 6.5–8.1)

## 2021-06-13 LAB — OCCULT BLOOD GASTRIC / DUODENUM (SPECIMEN CUP): Occult Blood, Gastric: POSITIVE — AB

## 2021-06-13 MED ORDER — POTASSIUM CHLORIDE CRYS ER 20 MEQ PO TBCR
40.0000 meq | EXTENDED_RELEASE_TABLET | Freq: Every day | ORAL | Status: DC
  Administered 2021-06-13 – 2021-06-15 (×2): 40 meq via ORAL
  Filled 2021-06-13 (×2): qty 2

## 2021-06-13 MED ORDER — OXYCODONE-ACETAMINOPHEN 5-325 MG PO TABS: 1.0000 | ORAL_TABLET | ORAL | Status: DC | PRN

## 2021-06-13 MED ORDER — MORPHINE SULFATE (PF) 2 MG/ML IV SOLN: 0.5000 mg | INTRAVENOUS | Status: DC | PRN

## 2021-06-13 MED ORDER — PREDNISONE 20 MG PO TABS: 40.0000 mg | ORAL_TABLET | Freq: Every day | ORAL | Status: DC

## 2021-06-13 MED ORDER — PANTOPRAZOLE INFUSION (NEW) - SIMPLE MED
8.0000 mg/h | INTRAVENOUS | Status: AC
  Administered 2021-06-13 – 2021-06-16 (×6): 8 mg/h via INTRAVENOUS
  Filled 2021-06-13 (×6): qty 80

## 2021-06-13 MED ORDER — INSULIN GLARGINE 100 UNIT/ML ~~LOC~~ SOLN
10.0000 [IU] | Freq: Every day | SUBCUTANEOUS | Status: DC
  Administered 2021-06-13: 10 [IU] via SUBCUTANEOUS
  Filled 2021-06-13 (×2): qty 0.1

## 2021-06-13 MED ORDER — METHYLPREDNISOLONE SODIUM SUCC 40 MG IJ SOLR
8.0000 mg | Freq: Every day | INTRAMUSCULAR | Status: DC
  Administered 2021-06-14 – 2021-06-16 (×3): 8 mg via INTRAVENOUS
  Filled 2021-06-13 (×3): qty 1

## 2021-06-13 MED ORDER — HEPARIN (PORCINE) 25000 UT/250ML-% IV SOLN
1100.0000 [IU]/h | INTRAVENOUS | Status: DC
  Filled 2021-06-13: qty 250

## 2021-06-13 MED ORDER — SODIUM CHLORIDE 0.9 % IV BOLUS
500.0000 mL | Freq: Once | INTRAVENOUS | Status: AC
  Administered 2021-06-13: 500 mL via INTRAVENOUS

## 2021-06-13 MED ORDER — PANTOPRAZOLE 80MG IVPB - SIMPLE MED
80.0000 mg | Freq: Once | INTRAVENOUS | Status: AC
  Administered 2021-06-13: 80 mg via INTRAVENOUS
  Filled 2021-06-13: qty 80

## 2021-06-13 NOTE — Consult Note (Signed)
   [facility] Inpatient Consult   06/13/2021  [name] 1951/09/22 [id number]  [facility] [[facility]]  Accountable Care Organization [ACO] Patient: [name] Medicare  Primary Care Provider:  Ollen Bowl, MD is an [employer] and this provider office is listed to do the Transition of Care follow up calls and appointments  Patient screened for length of stay  [17 days] hospitalization with noted high risk score for unplanned readmission risk and to assess for potential [facility]  [[facility]] Care Management service needs for post hospital transition.  Review of patient's medical record reveals patient is appealing for a CIR [inpatient rehab] level of care.  Plan:  Continue to follow progress and disposition to assess for post hospital care management needs.    For questions contact:   Charlesetta Shanks, RN BSN CCM [facility]  [phone] business mobile phone Toll free office [phone]  Fax number: [phone] [email or web address]

## 2021-06-13 NOTE — Progress Notes (Signed)
[facility]  Date of Admission:  05/26/2021     CC: Complicated diverticulitis/intraabd abscess  Lines: Peripheral iv's  Abx: 6/11-c Pip-tazo  ASSESSMENT: Intraabd abscess Complicated diverticulitis Gib -- egd no source of bleeding Dvt/pe (new -- bilateral PE); s/p ivc filter 6/16 Acute hypoxic resp failure due to PE, improved/resolved Dm2 Obesity Hx Grave's disease with orbitopathy, on steroid taper for this Hx Grave's disease treatment since with hypothyroidism HFpEF   Patient had IR evaluation 6/12 and there was no safe window for drainage. She had repeat ct on 6/18 which showed increased abscess size. She has been started on tpn  Had gib but resolved. Previous egd this admission no active source of bleeding. Ivc filter placed 6/16. Heparin stopped 6/15  Surgery is also comanaging and given comorbid condition with dvt/pe and steroid use, currently would like to avoid invasive management  S/p IR placement percutaneous drain 6/21. Cx growing pansenstive pseudomonas. Rather unexpected  6/28 assessment Ct repeat abdomen indicate abscess had resolved. Discussed with IR, who planned contrast study of catheter to r/o fistula Off tpn several days prior, tolerating PO Clinically much iproved  PLAN: Continue pip-tazo; transition to PO levofloxacin 750 mg po daily when able to tolerate PO well.  Would treat for 10 days of antibiotics after the day the percutaneous catheter is removed No need for outpatient ID follow up Discussed with primary team ID will sign off  I have spent a total of 20 minutes of face-to-face and non-face-to-face time, excluding clinical staff time, preparing to see patient, ordering tests and/or medications, and provide counseling the patient   Principal Problem:   Colonic diverticular abscess Active Problems:   Chronic diastolic CHF (congestive heart failure) (HCC)   Diabetes mellitus without ophthalmic  manifestations (HCC)   Moderate persistent asthma, uncomplicated   Seizure (HCC)   Bilateral pulmonary embolism (HCC)   Allergies  Allergen Reactions   Gluten Meal     Celiac    Scheduled Meds:  Chlorhexidine Gluconate Cloth  6 each Topical Daily   feeding supplement  237 mL Oral BID BM   insulin aspart  0-20 Units Subcutaneous TID WC   insulin glargine  10 Units Subcutaneous QHS   levothyroxine  112 mcg Oral Q0600   methylPREDNISolone (SOLU-MEDROL) injection  8 mg Intravenous Daily   potassium chloride  40 mEq Oral Daily   saccharomyces boulardii  250 mg Oral BID   simethicone  20 mg Oral QID   sodium chloride flush  10-40 mL Intracatheter Q12H   sodium chloride flush  5 mL Intracatheter Q8H   Continuous Infusions:  sodium chloride     sodium chloride     [START ON 06/14/2021] heparin     pantoprazole 8 mg/hr (06/13/21 1217)   piperacillin-tazobactam (ZOSYN)  IV 3.375 g (06/13/21 1211)   PRN Meds:.Gerhardt's butt cream, liver oil-zinc oxide, morphine injection, ondansetron (ZOFRAN) IV, sodium chloride flush   SUBJECTIVE: Eating well No f/c No n/v/diarrhea No abd pain  Reviewed ct abd result with patient Spoke with IR team about their plan for catheter management    Review of Systems: ROS All other ROS was negative, except mentioned above     OBJECTIVE: Vitals:   06/13/21 0338 06/13/21 0900 06/13/21 1120 06/13/21 1531  BP: 103/71 130/80 124/88 99/75  Pulse: 71 98 95 (!) 108  Resp: 18 18 18 18   Temp: 97.8 F (36.6 C) 98 F (36.7 C) 98.4 F (36.9 C)  98.7 F (37.1 C)  TempSrc: Oral Oral Oral Oral  SpO2: 95% 100% 100% 100%  Weight:      Height:       Body mass index is 42.82 kg/m.  Physical Exam General/constitutional: no distress, pleasant HEENT: Normocephalic, PER, Conj Clear, EOMI, Oropharynx clear Neck supple CV: rrr no mrg Lungs: clear to auscultation, normal respiratory effort Abd: Soft, Nontender; catheter in place, minimal serous  drainage Ext: no edema Skin: No Rash Neuro: nonfocal MSK: no peripheral joint swelling/tenderness/warmth; back spines nontender     Lab Results Lab Results  Component Value Date   WBC 12.5 (H) 06/13/2021   HGB 8.8 (L) 06/13/2021   HCT 28.5 (L) 06/13/2021   MCV 100.4 (H) 06/13/2021   PLT 264 06/13/2021    Lab Results  Component Value Date   CREATININE 0.90 06/13/2021   BUN 6 (L) 06/13/2021   NA 139 06/13/2021   K 3.0 (L) 06/13/2021   CL 100 06/13/2021   CO2 25 06/13/2021    Lab Results  Component Value Date   ALT 23 06/13/2021   AST 17 06/13/2021   ALKPHOS 69 06/13/2021   BILITOT 0.5 06/13/2021      Microbiology: Recent Results (from the past 240 hour(s))  Aerobic/Anaerobic Culture w Gram Stain (surgical/deep wound)     Status: None   Collection Time: 06/06/21 10:36 AM   Specimen: Abscess  Result Value Ref Range Status   Specimen Description ABSCESS  Final   Special Requests DRAIN  Final   Gram Stain   Final    ABUNDANT WBC PRESENT, PREDOMINANTLY PMN RARE GRAM POSITIVE COCCI IN PAIRS    Culture   Final    FEW PSEUDOMONAS AERUGINOSA WITHIN MIXED ORGANISMS MODERATE BACTEROIDES THETAIOTAOMICRON BETA LACTAMASE POSITIVE Performed at [facility], [address] [place], [place] [address]    Report Status 06/11/2021 FINAL  Final   Organism ID, Bacteria PSEUDOMONAS AERUGINOSA  Final      Susceptibility   Pseudomonas aeruginosa - MIC*    CEFTAZIDIME 4 SENSITIVE Sensitive     CIPROFLOXACIN <=0.25 SENSITIVE Sensitive     GENTAMICIN <=1 SENSITIVE Sensitive     IMIPENEM 1 SENSITIVE Sensitive     PIP/TAZO <=4 SENSITIVE Sensitive     CEFEPIME 2 SENSITIVE Sensitive     * FEW PSEUDOMONAS AERUGINOSA     Serology:   Imaging: If present, new imagings (plain films, ct scans, and mri) have been personally visualized and interpreted; radiology reports have been reviewed. Decision making incorporated into the Impression / Recommendations.  6/27 abd  ct Interval placement of percutaneous drainage catheter into large pelvic abscess noted on prior exam. The abscess appears to be nearly completely decompressed.  6/18 abd ct Stomach/Bowel: Rectal tube is noted within the rectum. The sigmoid colon again demonstrates diverticulosis although the degree of inflammatory change has improved in the interval from the prior exam. The more proximal colon is unremarkable. Appendix is well visualized and within normal limits. Extraluminal air is noted surrounding the colon in the pelvis slightly increased in the interval from the prior exam. Additionally a large air-fluid collection is again identified interposed between the small bowel loops and uterus adjacent to the sigmoid colon. This now measures 10.5 x 6.8 cm in greatest transverse and AP dimensions and extends for at least 6.7 cm in craniocaudad projection (increased from previous). This is again consistent with a large abscess. The abscess is again draped with multiple loops of small bowel precluding percutaneous drainage. The  surrounding small bowel loops again demonstrates some mild inflammatory thickening.  Raymondo Band, MD [facility] [phone] pager    06/13/2021, 4:00 PM

## 2021-06-13 NOTE — Progress Notes (Signed)
Pt had episode of N/V with dark red appearing emesis.  Dr. Mahala Menghini advised.

## 2021-06-13 NOTE — Progress Notes (Signed)
ANTICOAGULATION CONSULT NOTE - Initial Consult  Pharmacy Consult for lovenox>heparin Indication: pulmonary embolus and DVT  Allergies  Allergen Reactions   Gluten Meal     Celiac    Patient Measurements: Height: 5\' 2"  (157.5 cm) Weight: 106.2 kg (234 lb 2.1 oz) IBW/kg (Calculated) : 50.1  Vital Signs: Temp: 98.7 F (37.1 C) (06/28 1531) Temp Source: Oral (06/28 1531) BP: 99/75 (06/28 1531) Pulse Rate: 108 (06/28 1531)  Labs: Recent Labs    06/11/21 0246 06/12/21 0341 06/13/21 0627 06/13/21 1143  HGB 8.5* 8.0* 8.8* 8.8*  HCT 26.9* 26.1* 28.4* 28.5*  PLT 252 232 242 264  CREATININE 1.05* 1.06* 0.90  --      Estimated Creatinine Clearance: 66.6 mL/min (by C-G formula based on SCr of 0.9 mg/dL).   Medical History: Past Medical History:  Diagnosis Date   Anemia    Celiac disease    CHF (congestive heart failure) (HCC)    Depression    Diabetes mellitus without complication (HCC)    Edema    lower extremities   Emphysema of lung (HCC)    Enlarged heart    begining stage   Graves disease    Headache(784.0) 05/18/2013   Heart murmur    Hematochezia    Hemorrhoid    Hyperlipidemia    Hypertension    Migraine    Obese    OSA (obstructive sleep apnea)    Seizures (HCC)    Sleep apnea    Stroke [facility])    left P-O ICH 2007   Thyroid disease    hypothyroidism   Ulcer    Vitamin D deficiency    Assessment: 70 yo [name] presented on 05/26/2021 with abdominal pain found to have sigmoid diverticulitis with perforation and abscess. Hospital course complicated by PE and DVT s/p IVC filter placement and acute blood loss anemia due to GIB. Patient has been on lovenox 105mg  SQ q 12h with last dose 6/28 @0500 , now to transition to heparin gtt with workup for possible GI bleed.   Discussed with Dr. and will hold off on starting heparin until 0700 on 06/14/2021 with GIB.   Goal of Therapy:  Heparin level 0.3-0.5 units/ml Monitor platelets by anticoagulation  protocol: Yes   Plan:  Start heparin at 1100 units/hr with no bolus at 0700 on 6/29 Check 6 hr heparin level  Follow up bleeding   [name] [name], PharmD Clinical Pharmacist  06/13/2021 3:52 PM

## 2021-06-13 NOTE — Progress Notes (Addendum)
HOSPITAL MEDICINE OVERNIGHT EVENT NOTE    Notified that hemoglobin has dropped to 7.7, down from 8.8 earlier in the day.  Discussed patient condition with nursing.  Patient denies abdominal pain, has no change in mentation.  Patient has had virtually no urine output via pure wick catheter since beginning of shift.  Blood pressures have been trending downward since earlier in the afternoon with the last several blood pressures being 99/75, 90/61 and most recently 80/62.  Dr. Mahala Menghini the day provider discussed recent events with Dr. Elnoria Howard with gastroenterology at change of shift.  It was recommended that if hemoglobin downtrends to order stat CT angiogram.  Placing order for stat CT angiogram, repeat CBC (at 11PM) along with type and screen since one has not been performed since 6/19.  Ordering 500 cc isotonic fluid bolus in the meantime to temporize blood pressure and will likely proceed with blood transfusion once complete.  Will likely discuss with GI or IR based on CTA results.   Marinda Elk  MD [facility]   ADDENDUM (6/29 1:45am)  Hemoglobin has continued to downtrend to 7.0.  Patient has exhibited a least 100 cc of black loose stool mixed with dark red blood and occasional red clots.  Type and screen has resulted and therefore we are proceeding with 2 units of packed red blood cell transfusion.  Patient is receiving the first unit now.  Blood pressures are improving after initiation of blood transfusion.    Patient sent for stat CTA abdomen and pelvis but unfortunately according to radiology the study is nondiagnostic due to an injector and scanner malfunction.  I discussed the case with Dr. Elnoria Howard with gastroenterology.  He agrees with completing the blood transfusion in an effort to maintain hemodynamic stability as well as continued intravenous Protonix.  Plan is to proceed with endoscopic intervention in the morning.    We will continue to monitor patient closely.  Deno Lunger Yun Gutierrez

## 2021-06-13 NOTE — Hospital Course (Signed)
70 black female HFpEF EF 65-70% with mid to apical RV free wall mildly hypokinetic-severe PASP 65 7 mmHg aortic dilatation ascending aorta 41 mm HTN DM TY two 9.4 this admission BMI 43 OSA Appointment 05/26/2021 and/V/D = 2 weeks + abdominal pain CT ABD = perforated sigmoid diverticulitis 9.2X 5.8 cm-General surgery consulted-IR drainage unamenable History of Graves' disease on steroids Found to also have pulmonary embolism (unprovoked)  Placed on broad-spectrum antibiotics 6/11-TPN started and kept n.p.o.  Developed eventual GI bleed 6/16-EGD this admission no active source of bleeding given 1 unit PRBC -anticoagulation DC- 6/16 IVC filter placed 6/21 10 French drainage catheter placed into abscess growing Enterococcus gram-negative rod Last CT scan abdomen pelvis 6/27 nearly decompressed per general surgery   BUN/creatinine 8/1.06 WBC 9.3 hemoglobin 8.0 platelet 232

## 2021-06-13 NOTE — Progress Notes (Signed)
OT Cancellation Note  Patient Details Name: [name] MRN: [id number] DOB: 1951/08/28   Cancelled Treatment:    Reason Eval/Treat Not Completed: Medical issues which prohibited therapy (Pt with bloody stool heading to IR soon. OT to continue to follow for OT intervention.)  [name], OTR/L Acute Rehabilitation Services Pager: [phone] Office: [phone]   [name] C 06/13/2021, 3:05 PM

## 2021-06-13 NOTE — Progress Notes (Addendum)
PROGRESS NOTE   [name]  LKG:[id number] DOB: Jan 17, 1951 DOA: 05/26/2021 PCP: Ollen Bowl, MD  Brief Narrative:  85 black female HFpEF EF 65-70% with mid to apical RV free wall mildly hypokinetic-severe PASP 65 7 mmHg aortic dilatation ascending aorta 41 mm HTN DM TY two 9.4 this admission BMI 43 OSA History of Graves' disease on steroids  Appointment 05/26/2021 N/V/D = 2 weeks + abdominal pain CT ABD = perforated sigmoid diverticulitis 9.2X 5.8 cm-General surgery consulted-IR drainage unamenable  Found to also have pulmonary embolism (unprovoked)  Placed on broad-spectrum antibiotics 6/11-TPN started and kept n.p.o.  Developed eventual GI bleed 6/16-EGD this admission no active source of bleeding given 1 unit PRBC -anticoagulation DC- 6/16 IVC filter placed 6/21 10 French drainage catheter placed into abscess growing Enterococcus gram-negative rod Last CT scan abdomen pelvis 6/27 nearly decompressed per general surgery   BUN/creatinine 8/1.06 WBC 9.3 hemoglobin 8.0 platelet 232  Hospital-Problem based course  Lower GI bleed, upper Endo 6/16 (Dr. Elnoria Howard) no ulcers 1 unit blood transfused Hemoglobin remains in the 8 range Biliary emesis 6/28 Does not look bloody will obtain Gastroccult to ensure Obtain CBC I will follow and if it drops below 8 we may need to consider bleeding as an etiology Placed on clear liquid diet-hold Lovenox therapeutic dosing today-start Protonix gtt. with bolus If recurs will need gastroenterology input  Fluids at 75 cc/h now Perforated sigmoid diverticulitis 9.2 X5.8-10 French catheter placed 6/21 with Enterococcus Defer to IR for further management of drain CT 6/27 indicates decompressing abscess Drain injection per IR today Currently on Zosyn-infectious disease managing de-escalatio Attempt to de-escalate from morphine to Percocet when it is clear that she is not having any further emesis Provoked pulmonary embolus this admission  status post IVC filter (not a candidate for anticoagulation secondary to acute GI bleed) Defer retrieval ability to IR-continue the same ongoing Transition therapeutic Lovenox-->Eliquis eventually (currently Lovenox on hold secondary to possible bleed) Hypokalemia Replace with runs of potassium.today Repeat labs a.m. with magnesium in addition Graves' disease on steroids-follows with Melissa Raffety nurse practitioner at [facility] diagnosed ~ 20 years ago Is on Tepezza from [facility] and has been on chronic prednisone Continue levothyroxine 112 Currently is on Solu-Medrol 8 mg daily--transition back to prednisone 40 when no further vomit   DVT prophylaxis: scd Code Status: full Family Communication: Long discussion at bedside with daughter Updated her on plans because had seen the patient prior to her having biliary emesis-we will slow down the diet and keep on clears only today If Gastroccult turns positive Dr. Elnoria Howard will be consulted and I will need to convert all her meds to IV Disposition:  Status is: Inpatient  Remains inpatient appropriate because:Hemodynamically unstable, Persistent severe electrolyte disturbances, and Unsafe d/c plan  Dispo: The patient is from:  Unclear at this time              Anticipated d/c is to:  Unclear at this time              Patient currently is not medically stable to d/c.   Difficult to place patient No  Consultants:  Surgery ID IR GI    Procedures:  Upper endoscopy Abdominal abscess drain    Antimicrobials:  Zosyn     Subjective:  Coherent pleasant was quite tearful and emotional earlier this morning I left the room and was called back by nursing as she had small amount of greenish emesis it does not seem  to be bloody to me looks more biliary but we are confirming that it is not blood She does not have abdominal pain -- Chest pain, - fever, minus chills, - blurred vision or double vision at this  time  Objective: Vitals:   06/12/21 1929 06/12/21 2332 06/13/21 0338 06/13/21 0900  BP: 116/80 120/80 103/71 130/80  Pulse: 70 84 71 98  Resp: 16 16 18 18   Temp: 97.7 F (36.5 C) 98.2 F (36.8 C) 97.8 F (36.6 C) 98 F (36.7 C)  TempSrc: Oral Oral Oral Oral  SpO2: 91% 93% 95% 100%  Weight:      Height:        Intake/Output Summary (Last 24 hours) at 06/13/2021 1024 Last data filed at 06/13/2021 0900 Gross per 24 hour  Intake 555 ml  Output 2715 ml  Net -2160 ml   Filed Weights   05/27/21 0041 06/12/21 1600  Weight: 109.1 kg 106.2 kg    Examination:  Significant proptosis noted-slightly labile emotionally S1-S2 no murmur Drain in lower abdomen Abdomen grossly obese no rebound no guarding when I examined Chest clear no added sound no rales no rhonchi PICC line in place Patient on oxygen Neurologically intact moving all 4 limbs equally  Data Reviewed: personally reviewed   CBC    Component Value Date/Time   WBC 10.0 06/13/2021 0627   RBC 2.83 (L) 06/13/2021 0627   HGB 8.8 (L) 06/13/2021 0627   HCT 28.4 (L) 06/13/2021 0627   PLT 242 06/13/2021 0627   MCV 100.4 (H) 06/13/2021 0627   MCH 31.1 06/13/2021 0627   MCHC 31.0 06/13/2021 0627   RDW 18.5 (H) 06/13/2021 0627   LYMPHSABS 1.6 06/13/2021 0627   MONOABS 1.0 06/13/2021 0627   EOSABS 0.0 06/13/2021 0627   BASOSABS 0.1 06/13/2021 0627   CMP Latest Ref Rng & Units 06/13/2021 06/12/2021 06/11/2021  Glucose 70 - 99 mg/dL 06/13/2021) 70 81(W)  BUN 8 - 23 mg/dL 6(L) 8 10  Creatinine 29(H - 1.00 mg/dL 3.71 6.96) 7.89(F)  Sodium 135 - 145 mmol/L 139 137 137  Potassium 3.5 - 5.1 mmol/L 3.0(L) 3.7 3.5  Chloride 98 - 111 mmol/L 100 100 103  CO2 22 - 32 mmol/L 25 29 28   Calcium 8.9 - 10.3 mg/dL 8.10(F) ) 7.5(Z)  Total Protein 6.5 - 8.1 g/dL 4.9(L) - 4.8(L)  Total Bilirubin 0.3 - 1.2 mg/dL 0.5 - 0.5  Alkaline Phos 38 - 126 U/L 69 - 82  AST 15 - 41 U/L 17 - 20  ALT 0 - 44 U/L 23 - 26     Radiology Studies: CT  ABDOMEN PELVIS W CONTRAST  Result Date: 06/12/2021 CLINICAL DATA:  Intra-abdominal abscess. EXAM: CT ABDOMEN AND PELVIS WITH CONTRAST TECHNIQUE: Multidetector CT imaging of the abdomen and pelvis was performed using the standard protocol following bolus administration of intravenous contrast. CONTRAST:  06-06-1989 OMNIPAQUE IOHEXOL 300 MG/ML  SOLN COMPARISON:  June 03, 2021. FINDINGS: Lower chest: No acute abnormality. Hepatobiliary: No focal liver abnormality is seen. No gallstones, gallbladder wall thickening, or biliary dilatation. Pancreas: Unremarkable. No pancreatic ductal dilatation or surrounding inflammatory changes. Spleen: Normal in size without focal abnormality. Adrenals/Urinary Tract: Adrenal glands are unremarkable. Kidneys are normal, without renal calculi, focal lesion, or hydronephrosis. Bladder is unremarkable. Stomach/Bowel: Stomach appears normal. No colonic dilatation is noted. No evidence of bowel obstruction is noted. Vascular/Lymphatic: Aortic atherosclerosis. No enlarged abdominal or pelvic lymph nodes. IVC filter is noted in infrarenal position. Reproductive: Uterus and bilateral adnexa are unremarkable.  Other: There is been interval placement of percutaneous drainage catheter into large pelvic abscess noted on prior exam. The abscess appears to be nearly completely decompressed. Musculoskeletal: No acute or significant osseous findings. IMPRESSION: Interval placement of percutaneous drainage catheter into large pelvic abscess noted on prior exam. The abscess appears to be nearly completely decompressed. Electronically Signed   By: Lupita Raider M.D.   On: 06/12/2021 16:07     Scheduled Meds:  Chlorhexidine Gluconate Cloth  6 each Topical Daily   enoxaparin (LOVENOX) injection  105 mg Subcutaneous Q12H   feeding supplement  237 mL Oral BID BM   insulin aspart  0-20 Units Subcutaneous TID WC   insulin glargine  16 Units Subcutaneous QHS   levothyroxine  112 mcg Oral Q0600    methylPREDNISolone (SOLU-MEDROL) injection  8 mg Intravenous Daily   pantoprazole  40 mg Oral BID   saccharomyces boulardii  250 mg Oral BID   simethicone  20 mg Oral QID   sodium chloride flush  10-40 mL Intracatheter Q12H   sodium chloride flush  5 mL Intracatheter Q8H   Continuous Infusions:  sodium chloride     sodium chloride     piperacillin-tazobactam (ZOSYN)  IV 3.375 g (06/13/21 0502)     LOS: 18 days   Time spent: 45  Rhetta Mura, MD [facility] To contact the attending provider between 7A-7P or the covering provider during after hours 7P-7A, please log into the web site [email or web address] and access using universal Advance password for that web site. If you do not have the password, please call the hospital operator.  06/13/2021, 10:24 AM

## 2021-06-13 NOTE — Progress Notes (Signed)
Progress Note  12 Days Post-Op  Subjective: She is feeling emotional and discourage from her long admission. She has not been eating much and she and daughter think this is related to taste and limited food options with gluten restricted diet (celiac). She had a little nausea but no emesis at time of my visit  Of note shortly after rounding I was contacted by primary that she had episode of bilious emesis.  Objective: Vital signs in last 24 hours: Temp:  [97.7 F (36.5 C)-98.2 F (36.8 C)] 98 F (36.7 C) (06/28 0900) Pulse Rate:  [70-98] 98 (06/28 0900) Resp:  [16-20] 18 (06/28 0900) BP: (103-130)/(68-80) 130/80 (06/28 0900) SpO2:  [91 %-100 %] 100 % (06/28 0900) Weight:  [106.2 kg] 106.2 kg (06/27 1600) Last BM Date: 06/13/21  Intake/Output from previous day: 06/27 0701 - 06/28 0700 In: 555 [P.O.:480] Out: 2115 [Urine:2100; Drains:15] Intake/Output this shift: Total I/O In: 270 [P.O.:240; Other:30] Out: 600 [Urine:600]  PE: General: tearful, WD, female who is sitting up in bed in NAD HEENT: head is normocephalic, atraumatic. Mouth is pink and moist Heart:  Palpable radial pulses bilaterally Lungs: Respiratory effort nonlabored Abd: soft, NT, ND, +BS, drain with minimal sanguinous fluid MS: all 4 extremities are symmetrical with no cyanosis, clubbing, or edema. Skin: warm and dry with no masses, lesions, or rashes Psych: A&Ox3 with an appropriate affect.   Lab Results:  Recent Labs    06/12/21 0341 06/13/21 0627  WBC 9.3 10.0  HGB 8.0* 8.8*  HCT 26.1* 28.4*  PLT 232 242    BMET Recent Labs    06/12/21 0341 06/13/21 0627  NA 137 139  K 3.7 3.0*  CL 100 100  CO2 29 25  GLUCOSE 70 68*  BUN 8 6*  CREATININE 1.06* 0.90  CALCIUM 8.8* 8.7*    PT/INR No results for input(s): LABPROT, INR in the last 72 hours. CMP     Component Value Date/Time   NA 139 06/13/2021 0627   NA 141 11/07/2020 1058   K 3.0 (L) 06/13/2021 0627   CL 100 06/13/2021 0627    CO2 25 06/13/2021 0627   GLUCOSE 68 (L) 06/13/2021 0627   BUN 6 (L) 06/13/2021 0627   BUN 13 11/07/2020 1058   CREATININE 0.90 06/13/2021 0627   CALCIUM 8.7 (L) 06/13/2021 0627   PROT 4.9 (L) 06/13/2021 0627   PROT 6.5 11/07/2020 1058   ALBUMIN 2.4 (L) 06/13/2021 0627   ALBUMIN 4.3 11/07/2020 1058   AST 17 06/13/2021 0627   ALT 23 06/13/2021 0627   ALKPHOS 69 06/13/2021 0627   BILITOT 0.5 06/13/2021 0627   BILITOT 0.6 11/07/2020 1058   GFRNONAA >60 06/13/2021 0627   GFRAA 61 11/07/2020 1058   Lipase     Component Value Date/Time   LIPASE 19 05/26/2021 1601       Studies/Results: CT ABDOMEN PELVIS W CONTRAST  Result Date: 06/12/2021 CLINICAL DATA:  Intra-abdominal abscess. EXAM: CT ABDOMEN AND PELVIS WITH CONTRAST TECHNIQUE: Multidetector CT imaging of the abdomen and pelvis was performed using the standard protocol following bolus administration of intravenous contrast. CONTRAST:  OMNIPAQUE IOHEXOL 300 MG/ML  SOLN COMPARISON:  June 03, 2021. FINDINGS: Lower chest: No acute abnormality. Hepatobiliary: No focal liver abnormality is seen. No gallstones, gallbladder wall thickening, or biliary dilatation. Pancreas: Unremarkable. No pancreatic ductal dilatation or surrounding inflammatory changes. Spleen: Normal in size without focal abnormality. Adrenals/Urinary Tract: Adrenal glands are unremarkable. Kidneys are normal, without renal calculi, focal lesion,  or hydronephrosis. Bladder is unremarkable. Stomach/Bowel: Stomach appears normal. No colonic dilatation is noted. No evidence of bowel obstruction is noted. Vascular/Lymphatic: Aortic atherosclerosis. No enlarged abdominal or pelvic lymph nodes. IVC filter is noted in infrarenal position. Reproductive: Uterus and bilateral adnexa are unremarkable. Other: There is been interval placement of percutaneous drainage catheter into large pelvic abscess noted on prior exam. The abscess appears to be nearly completely decompressed.  Musculoskeletal: No acute or significant osseous findings. IMPRESSION: Interval placement of percutaneous drainage catheter into large pelvic abscess noted on prior exam. The abscess appears to be nearly completely decompressed. Electronically Signed   By: Lupita Raider M.D.   On: 06/12/2021 16:07    Anti-infectives: Anti-infectives (From admission, onward)    Start     Dose/Rate Route Frequency Ordered Stop   05/27/21 0500  piperacillin-tazobactam (ZOSYN) IVPB 3.375 g        3.375 g 12.5 mL/hr over 240 Minutes Intravenous Every 8 hours 05/26/21 2126     05/26/21 2015  ceFEPIme (MAXIPIME) 2 g in sodium chloride 0.9 % 100 mL IVPB       See Hyperspace for full Linked Orders Report.   2 g 200 mL/hr over 30 Minutes Intravenous  Once 05/26/21 2004 05/26/21 2107   05/26/21 2015  metroNIDAZOLE (FLAGYL) IVPB 500 mg       See Hyperspace for full Linked Orders Report.   500 mg 100 mL/hr over 60 Minutes Intravenous  Once 05/26/21 2004 05/26/21 2135        Assessment/Plan  27F Diverticulitis with intra-abdominal abscess (9x5 cm) - afebrile, WBC up to 12.5 today, is on chronic steroids as well  - Continue drain  -  culture positive for pseudomonas sensitive to zosyn - Repeat CT with abscess nearly completely decompressed. - IR planning drain injection today - No emergent surgical needs at this juncture - ID involved managing abx - hoping to switch to oral soon pending emesis - OOB/mobilize with therapies  - agree with clears    FEN: clears, IVF ID: Zosyn 6/10>> VTE: SCD's Foley: none, external cath Dispo: SDU   - Below per primary service - Bilateral PE-heparin IV held due to hgb requiring transfusion. S/p IVCF 6/16 by IR Melena - EGD 6/16 normal esophagus, erythematous mucosa in the antrum, duodenitis but no active bleeding. GI signed off. Acute respiratory failure with hypoxia Thombocytopenia Chronic diastolic heart failure CKD IIIa HTN Graves dz DM2 Morbid obesity    LOS:  18 days    Eric Form, [facility] Surgery 06/13/2021, 10:24 AM Please see Amion for pager number during day hours 7:00am-4:30pm

## 2021-06-13 NOTE — Progress Notes (Signed)
Referring Physician(s): Dr Deland Pretty  Supervising Physician: Ruthann Cancer  Patient Status:  [facility] - In-pt  Chief Complaint:  Diverticular abscess  Subjective:  Drain placed in IR 06/06/21 OP still is thin and brown color OP is slowing Denies pain; flushes easily Afeb CT yesterday:  IMPRESSION: Interval placement of percutaneous drainage catheter into large pelvic abscess noted on prior exam. The abscess appears to be nearly completely decompressed.   Allergies: Gluten meal  Medications: Prior to Admission medications   Medication Sig Start Date End Date Taking? Authorizing Provider  albuterol (VENTOLIN HFA) 108 (90 Base) MCG/ACT inhaler Inhale 2 puffs into the lungs every 6 (six) hours as needed for wheezing or shortness of breath.   Yes [provider]  amLODipine (NORVASC) 5 MG tablet Take 1 tablet (5 mg total) by mouth daily. 10/04/20  Yes Nahser, Wonda Cheng, MD  carvedilol (COREG) 25 MG tablet Take 1 tablet (25 mg total) by mouth 2 (two) times daily with a meal. 12/05/12  Yes Nahser, Wonda Cheng, MD  esomeprazole (NEXIUM) 20 MG capsule Take 1 capsule by mouth daily.   Yes [provider]  furosemide (LASIX) 20 MG tablet Take 1 tablet (20 mg total) by mouth daily. 05/22/21  Yes Hans Eden, NP  levothyroxine (SYNTHROID) 112 MCG tablet Take 1 tablet by mouth Monday through Saturday and 1/2 tablet on Sunday. 11/08/20  Yes Elayne Snare, MD  metFORMIN (GLUCOPHAGE) 500 MG tablet Take 500 mg by mouth 2 (two) times daily. 08/23/20  Yes [provider]  OVER THE COUNTER MEDICATION Take 1 tablet by mouth daily. Medication: Vitamin b12 3000 mcg   Yes [provider]  potassium chloride (KLOR-CON) 10 MEQ tablet Take 2 tablets (20 mEq total) by mouth daily. 11/07/20  Yes Nahser, Wonda Cheng, MD  predniSONE (DELTASONE) 20 MG tablet Take 60 mg by mouth daily with breakfast.   Yes [provider]  rosuvastatin (CRESTOR) 5 MG tablet Take 1 tablet (5  mg total) by mouth daily. 11/08/20  Yes Nahser, Wonda Cheng, MD  Blood Glucose Monitoring Suppl (ONETOUCH VERIO REFLECT) w/Device KIT 1 each by Other route as directed. 08/10/20   [provider]  Fluticasone-Salmeterol (ADVAIR) 250-50 MCG/DOSE AEPB Inhale 1 puff into the lungs daily as needed for allergies.    [provider]  glucose blood (ONETOUCH VERIO) test strip Use as intstructed to check blood sugar once a day Dx Code E11.9 02/18/21   Elayne Snare, MD  Lancets [facility] ULTRASOFT) lancets Use as instructed 10/13/20   Elayne Snare, MD  meclizine (ANTIVERT) 25 MG tablet Take 1 tablet by mouth daily as needed for dizziness.    [provider]     Vital Signs: BP 103/71 (BP Location: Left Arm)   Pulse 71   Temp 97.8 F (36.6 C) (Oral)   Resp 18   Ht 5' 2" (1.575 m)   Wt 234 lb 2.1 oz (106.2 kg)   SpO2 95%   BMI 42.82 kg/m   Physical Exam Skin:    Comments: Site is c/d/I No infection OP minimal Yellow/bloody  Afeb NT to touch    Imaging: CT ABDOMEN PELVIS W CONTRAST  Result Date: 06/12/2021 CLINICAL DATA:  Intra-abdominal abscess. EXAM: CT ABDOMEN AND PELVIS WITH CONTRAST TECHNIQUE: Multidetector CT imaging of the abdomen and pelvis was performed using the standard protocol following bolus administration of intravenous contrast. CONTRAST:  14m OMNIPAQUE IOHEXOL 300 MG/ML  SOLN COMPARISON:  June 03, 2021. FINDINGS: Lower chest: No  acute abnormality. Hepatobiliary: No focal liver abnormality is seen. No gallstones, gallbladder wall thickening, or biliary dilatation. Pancreas: Unremarkable. No pancreatic ductal dilatation or surrounding inflammatory changes. Spleen: Normal in size without focal abnormality. Adrenals/Urinary Tract: Adrenal glands are unremarkable. Kidneys are normal, without renal calculi, focal lesion, or hydronephrosis. Bladder is unremarkable. Stomach/Bowel: Stomach appears normal. No colonic dilatation is noted. No evidence of bowel  obstruction is noted. Vascular/Lymphatic: Aortic atherosclerosis. No enlarged abdominal or pelvic lymph nodes. IVC filter is noted in infrarenal position. Reproductive: Uterus and bilateral adnexa are unremarkable. Other: There is been interval placement of percutaneous drainage catheter into large pelvic abscess noted on prior exam. The abscess appears to be nearly completely decompressed. Musculoskeletal: No acute or significant osseous findings. IMPRESSION: Interval placement of percutaneous drainage catheter into large pelvic abscess noted on prior exam. The abscess appears to be nearly completely decompressed. Electronically Signed   By: Marijo Conception M.D.   On: 06/12/2021 16:07    Labs:  CBC: Recent Labs    06/10/21 0244 06/11/21 0246 06/12/21 0341 06/13/21 0627  WBC 10.7* 10.1 9.3 10.0  HGB 8.3* 8.5* 8.0* 8.8*  HCT 25.9* 26.9* 26.1* 28.4*  PLT 234 252 232 242    COAGS: Recent Labs    06/06/21 0230  INR 1.1    BMP: Recent Labs    11/07/20 1058 03/26/21 1927 06/10/21 0244 06/11/21 0246 06/12/21 0341 06/13/21 0627  NA 141   < > 133* 137 137 139  K 4.1   < > 3.9 3.5 3.7 3.0*  CL 103   < > 97* 103 100 100  CO2 24   < > _0 GLUCOSE 118*   < > 71 68* 70 68*  BUN 13   < > _1 6*  CALCIUM 9.9   < > 8.9 8.7* 8.8* 8.7*  CREATININE 1.06*   < > 0.98 1.05* 1.06* 0.90  GFRNONAA 53*   < > >60 57* 57* >60  GFRAA 61  --   --   --   --   --    < > = values in this interval not displayed.    LIVER FUNCTION TESTS: Recent Labs    06/06/21 0230 06/08/21 0459 06/11/21 0246 06/13/21 0627  BILITOT 0.6 <0.1* 0.5 0.5  AST _2 ALT _3 ALKPHOS 93 77 82 69  PROT 4.5* 4.4* 4.8* 4.9*  ALBUMIN 2.0* 2.0* 2.2* 2.4*    Assessment and Plan:  Divertic abscess drain Minimal OP Flushes easily CT showing resolution For drain injection today in IR  Electronically Signed: Lavonia Drafts, PA-C 06/13/2021, 8:35 AM   I spent a total of 15 Minutes at  the the patient's bedside AND on the patient's hospital floor or unit, greater than 50% of which was counseling/coordinating care for divertic abscess drain

## 2021-06-13 NOTE — Progress Notes (Signed)
ANTICOAGULATION CONSULT NOTE - Initial Consult  Pharmacy Consult for lovenox>heparin Indication: pulmonary embolus and DVT  Allergies  Allergen Reactions   Gluten Meal     Celiac    Patient Measurements: Height: 5\' 2"  (157.5 cm) Weight: 106.2 kg (234 lb 2.1 oz) IBW/kg (Calculated) : 50.1  Vital Signs: Temp: 98.4 F (36.9 C) (06/28 1120) Temp Source: Oral (06/28 1120) BP: 124/88 (06/28 1120) Pulse Rate: 95 (06/28 1120)  Labs: Recent Labs    06/11/21 0246 06/12/21 0341 06/13/21 0627 06/13/21 1143  HGB 8.5* 8.0* 8.8* 8.8*  HCT 26.9* 26.1* 28.4* 28.5*  PLT 252 232 242 264  CREATININE 1.05* 1.06* 0.90  --      Estimated Creatinine Clearance: 66.6 mL/min (by C-G formula based on SCr of 0.9 mg/dL).   Medical History: Past Medical History:  Diagnosis Date   Anemia    Celiac disease    CHF (congestive heart failure) (HCC)    Depression    Diabetes mellitus without complication (HCC)    Edema    lower extremities   Emphysema of lung (HCC)    Enlarged heart    begining stage   Graves disease    Headache(784.0) 05/18/2013   Heart murmur    Hematochezia    Hemorrhoid    Hyperlipidemia    Hypertension    Migraine    Obese    OSA (obstructive sleep apnea)    Seizures (HCC)    Sleep apnea    Stroke [facility])    left P-O ICH 2007   Thyroid disease    hypothyroidism   Ulcer    Vitamin D deficiency    Assessment: 70 yo female presented on 05/26/2021 with abdominal pain found to have sigmoid diverticulitis with perforation and abscess. Hospital course complicated by PE and DVT s/p IVC filter placement and acute blood loss anemia due to GIB. Patient has been on lovenox 105mg  SQ q 12h with last dose 6/28 @0500 , now to transition to heparin gtt with workup for possible GI bleed  Goal of Therapy:  Heparin level 0.3-0.5 units/ml Monitor platelets by anticoagulation protocol: Yes   Plan:  Heparin gtt at 1100 units/hr, no bolus @1700  F/u 6 hour heparin level F/u GIB  workup and ability to transition to PO  , PharmD Clinical Pharmacist ED Pharmacist Phone # [phone] 06/13/2021 2:55 PM

## 2021-06-13 NOTE — Progress Notes (Signed)
D/w Dr. Stana Bunting usually equivocal and not reliable Hemoglobin is stable Will keep on clear liquids today Start Heparin gtt this pm--tansition to loveneox in the near future  If frank blood in emesis, or dark tarry stool-please let MD know and contact Dr. Elnoria Howard Additionally.  Pleas Koch, MD [facility] 2:47 PM

## 2021-06-13 NOTE — Progress Notes (Signed)
Inpatient Rehab Admissions Coordinator:   Expedited appeal begun today at 12:40pm.  Will take 72 hours to process.   Estill Dooms, PT, DPT Admissions Coordinator [phone] 06/13/21  2:23 PM

## 2021-06-14 ENCOUNTER — Encounter (HOSPITAL_COMMUNITY)
Admission: EM | Disposition: A | Discharge status: Skilled Nursing Facility | Payer: Self-pay | Source: Home / Self Care | Attending: Internal Medicine

## 2021-06-14 ENCOUNTER — Inpatient Hospital Stay (HOSPITAL_COMMUNITY): Payer: Medicare HMO

## 2021-06-14 HISTORY — PX: ESOPHAGOGASTRODUODENOSCOPY: SHX5428

## 2021-06-14 HISTORY — PX: IR SINUS/FIST TUBE CHK-NON GI: IMG673

## 2021-06-14 LAB — CBC WITH DIFFERENTIAL/PLATELET
Abs Immature Granulocytes: 0.09 10*3/uL — ABNORMAL HIGH (ref 0.00–0.07)
Basophils Absolute: 0.1 10*3/uL (ref 0.0–0.1)
Basophils Relative: 1 %
Eosinophils Absolute: 0 10*3/uL (ref 0.0–0.5)
Eosinophils Relative: 0 %
HCT: 30 % — ABNORMAL LOW (ref 36.0–46.0)
Hemoglobin: 9.5 g/dL — ABNORMAL LOW (ref 12.0–15.0)
Immature Granulocytes: 1 %
Lymphocytes Relative: 17 %
Lymphs Abs: 2.1 10*3/uL (ref 0.7–4.0)
MCH: 31 pg (ref 26.0–34.0)
MCHC: 31.7 g/dL (ref 30.0–36.0)
MCV: 98 fL (ref 80.0–100.0)
Monocytes Absolute: 1.2 10*3/uL — ABNORMAL HIGH (ref 0.1–1.0)
Monocytes Relative: 10 %
Neutro Abs: 8.7 10*3/uL — ABNORMAL HIGH (ref 1.7–7.7)
Neutrophils Relative %: 71 %
Platelets: 187 10*3/uL (ref 150–400)
RBC: 3.06 MIL/uL — ABNORMAL LOW (ref 3.87–5.11)
RDW: 17.2 % — ABNORMAL HIGH (ref 11.5–15.5)
WBC: 12.2 10*3/uL — ABNORMAL HIGH (ref 4.0–10.5)
nRBC: 0.3 % — ABNORMAL HIGH (ref 0.0–0.2)

## 2021-06-14 LAB — COMPREHENSIVE METABOLIC PANEL
ALT: 18 U/L (ref 0–44)
AST: 16 U/L (ref 15–41)
Albumin: 2 g/dL — ABNORMAL LOW (ref 3.5–5.0)
Alkaline Phosphatase: 53 U/L (ref 38–126)
Anion gap: 6 (ref 5–15)
BUN: 16 mg/dL (ref 8–23)
CO2: 23 mmol/L (ref 22–32)
Calcium: 8.1 mg/dL — ABNORMAL LOW (ref 8.9–10.3)
Chloride: 111 mmol/L (ref 98–111)
Creatinine, Ser: 0.98 mg/dL (ref 0.44–1.00)
GFR, Estimated: >60 mL/min (ref >60)
Glucose, Bld: 92 mg/dL (ref 70–99)
Potassium: 3.9 mmol/L (ref 3.5–5.1)
Sodium: 140 mmol/L (ref 135–145)
Total Bilirubin: 0.5 mg/dL (ref 0.3–1.2)
Total Protein: 4.1 g/dL — ABNORMAL LOW (ref 6.5–8.1)

## 2021-06-14 LAB — GLUCOSE, CAPILLARY
Glucose-Capillary: 101 mg/dL — ABNORMAL HIGH (ref 70–99)
Glucose-Capillary: 129 mg/dL — ABNORMAL HIGH (ref 70–99)
Glucose-Capillary: 130 mg/dL — ABNORMAL HIGH (ref 70–99)
Glucose-Capillary: 68 mg/dL — ABNORMAL LOW (ref 70–99)
Glucose-Capillary: 79 mg/dL (ref 70–99)
Glucose-Capillary: 80 mg/dL (ref 70–99)
Glucose-Capillary: 88 mg/dL (ref 70–99)

## 2021-06-14 LAB — PREPARE RBC (CROSSMATCH)

## 2021-06-14 LAB — MAGNESIUM: Magnesium: 1.6 mg/dL — ABNORMAL LOW (ref 1.7–2.4)

## 2021-06-14 LAB — HEMOGLOBIN AND HEMATOCRIT, BLOOD
HCT: 27.2 % — ABNORMAL LOW (ref 36.0–46.0)
Hemoglobin: 8.9 g/dL — ABNORMAL LOW (ref 12.0–15.0)

## 2021-06-14 SURGERY — EGD (ESOPHAGOGASTRODUODENOSCOPY)
Anesthesia: Moderate Sedation

## 2021-06-14 MED ORDER — SODIUM CHLORIDE 0.9 % IV BOLUS
1000.0000 mL | Freq: Once | INTRAVENOUS | Status: AC
  Administered 2021-06-14: 1000 mL via INTRAVENOUS

## 2021-06-14 MED ORDER — INSULIN ASPART 100 UNIT/ML IJ SOLN
0.0000 [IU] | INTRAMUSCULAR | Status: DC
  Administered 2021-06-14 – 2021-06-16 (×4): 1 [IU] via SUBCUTANEOUS

## 2021-06-14 MED ORDER — FENTANYL CITRATE (PF) 100 MCG/2ML IJ SOLN
INTRAMUSCULAR | Status: AC
  Filled 2021-06-14: qty 4

## 2021-06-14 MED ORDER — SODIUM CHLORIDE 0.9 % IV SOLN: INTRAVENOUS | Status: DC

## 2021-06-14 MED ORDER — SODIUM CHLORIDE 0.9% IV SOLUTION: Freq: Once | INTRAVENOUS | Status: AC

## 2021-06-14 MED ORDER — DEXTROSE 50 % IV SOLN
INTRAVENOUS | Status: AC
  Administered 2021-06-14: 25 mL
  Filled 2021-06-14: qty 50

## 2021-06-14 MED ORDER — SODIUM CHLORIDE 0.9 % IV BOLUS: 1000.0000 mL | INTRAVENOUS | Status: DC

## 2021-06-14 MED ORDER — MIDAZOLAM HCL (PF) 10 MG/2ML IJ SOLN
INTRAMUSCULAR | Status: DC | PRN
  Administered 2021-06-14 (×2): 2 mg via INTRAVENOUS

## 2021-06-14 MED ORDER — DIPHENHYDRAMINE HCL 50 MG/ML IJ SOLN
INTRAMUSCULAR | Status: AC
  Filled 2021-06-14: qty 1

## 2021-06-14 MED ORDER — IOHEXOL 300 MG/ML  SOLN
50.0000 mL | Freq: Once | INTRAMUSCULAR | Status: AC | PRN
  Administered 2021-06-14: 10 mL

## 2021-06-14 MED ORDER — MIRTAZAPINE 15 MG PO TABS
7.5000 mg | ORAL_TABLET | Freq: Every day | ORAL | Status: DC
  Administered 2021-06-14 – 2021-06-22 (×9): 7.5 mg via ORAL
  Filled 2021-06-14 (×9): qty 1

## 2021-06-14 MED ORDER — MIDAZOLAM HCL (PF) 5 MG/ML IJ SOLN
INTRAMUSCULAR | Status: AC
  Filled 2021-06-14: qty 1

## 2021-06-14 MED ORDER — SODIUM CHLORIDE 0.9% IV SOLUTION: Freq: Once | INTRAVENOUS | Status: DC

## 2021-06-14 MED ORDER — BUTAMBEN-TETRACAINE-BENZOCAINE 2-2-14 % EX AERO
INHALATION_SPRAY | CUTANEOUS | Status: DC | PRN
  Administered 2021-06-14: 2 via TOPICAL

## 2021-06-14 MED ORDER — INSULIN ASPART 100 UNIT/ML IJ SOLN
0.0000 [IU] | INTRAMUSCULAR | Status: DC
Start: 2021-06-14 — End: 2021-06-14

## 2021-06-14 MED ORDER — IOHEXOL 350 MG/ML SOLN
100.0000 mL | Freq: Once | INTRAVENOUS | Status: AC | PRN
  Administered 2021-06-14: 100 mL via INTRAVENOUS

## 2021-06-14 MED ORDER — MAGNESIUM SULFATE 2 GM/50ML IV SOLN
2.0000 g | Freq: Once | INTRAVENOUS | Status: AC
  Administered 2021-06-14: 2 g via INTRAVENOUS
  Filled 2021-06-14: qty 50

## 2021-06-14 MED ORDER — SODIUM CHLORIDE 0.9 % IV BOLUS: 1000.0000 mL | Freq: Once | INTRAVENOUS | Status: DC

## 2021-06-14 NOTE — Op Note (Signed)
[facility] Patient Name: [name] Procedure Date : 06/14/2021 MRN: [id number] Attending MD: Jeani Hawking , MD Date of Birth: 19-Nov-1951 CSN: [id number] Age: 70 Admit Type: Inpatient Procedure:                Upper GI endoscopy Indications:              Hematemesis Providers:                Jeani Hawking, MD, Adolph Pollack, RN, Rozetta Nunnery, Technician Referring MD:              Medicines:                Midazolam 4 mg IV Complications:            No immediate complications. Estimated Blood Loss:     Estimated blood loss: none. Procedure:                Pre-Anesthesia Assessment:                           - Prior to the procedure, a History and Physical                            was performed, and patient medications and                            allergies were reviewed. The patient's tolerance of                            previous anesthesia was also reviewed. The risks                            and benefits of the procedure and the sedation                            options and risks were discussed with the patient.                            All questions were answered, and informed consent                            was obtained. Prior Anticoagulants: The patient has                            taken no previous anticoagulant or antiplatelet                            agents. ASA Grade Assessment: III - A patient with                            severe systemic disease. After reviewing the risks  and benefits, the patient was deemed in                            satisfactory condition to undergo the procedure.                           - Sedation was administered by an endoscopy nurse.                            Deep sedation was attained.                           After obtaining informed consent, the endoscope was                            passed under direct vision. Throughout the                             procedure, the patient's blood pressure, pulse, and                            oxygen saturations were monitored continuously. The                            GIF-H190 ([id number]) Olympus gastroscope was                            introduced through the mouth, and advanced to the                            second part of duodenum. The upper GI endoscopy was                            somewhat difficult due to the patient's position                            intolerance. Successful completion of the procedure                            was aided by increasing the dose of sedation                            medication. The patient tolerated the procedure                            fairly well. Scope In: Scope Out: Findings:      The esophagus was normal.      Patchy mildly erythematous mucosa without bleeding was found in the       gastric antrum.      Hematin (altered blood/coffee-ground-like material) was found in the       gastric body.      Diffuse moderately erythematous mucosa without active bleeding and with       stigmata of bleeding was found in the duodenal bulb.      Entry into  the gastric lumen showed a significant amount of hematin, but       there was no clear source of bleeding. Erythema of the antral mucosa was       identified, but there was no evidence of any friability or active       bleeding. In the duodenal bulb and at the transition from the bulb into       D2 showed a moderate amount of erythema and hematin, but no active       bleeding. There was evidence of very superficial healing ulcerations. It       is not clear if the current findings can explain the amount of bleeding       from last evening. Impression:               - Normal esophagus.                           - Erythematous mucosa in the antrum.                           - Hematin (altered blood/coffee-ground-like                            material) in the gastric body.                            - Erythematous duodenopathy.                           - No specimens collected. Recommendation:           - Return patient to hospital ward for ongoing care.                           - Clear liquid diet.                           - Continue with pantoprazole.                           - Follow HGB and transfuse if necessary.                           - If bleeding recurs, reattempt CTA. Procedure Code(s):        --- Professional ---                           [phone], Esophagogastroduodenoscopy, flexible,                            transoral; diagnostic, including collection of                            specimen(s) by brushing or washing, when performed                            (separate procedure) Diagnosis Code(s):        --- Professional ---  K31.89, Other diseases of stomach and duodenum                           K92.2, Gastrointestinal hemorrhage, unspecified                           K92.0, Hematemesis CPT copyright 2019 American Medical Association. All rights reserved. The codes documented in this report are preliminary and upon coder review may  be revised to meet current compliance requirements. Jeani HawkingPatrick Brecken Dewoody, MD Jeani HawkingPatrick Josepha Barbier, MD 06/14/2021 1:21:04 PM This report has been signed electronically. Number of Addenda: 0

## 2021-06-14 NOTE — Progress Notes (Addendum)
   06/14/21 0648  Clinical Encounter Type  Visited With Patient  Visit Type Initial;Psychological support;Spiritual support;Social support  Referral From Nurse  Consult/Referral To Chaplain  Spiritual Encounters  Spiritual Needs Prayer;Emotional  Stress Factors  Patient Stress Factors Health changes;Loss of control   Chaplain responded to page from Pt's nurse. When chaplain arrived, Pt was crying, saying that she has rights. Pt has been feeling a loss of control due to hospital stay and health changes. Pt says that everyone thinks she's crazy and can't understand what she is feeling. This Chaplain affirmed that at times things can feel hopeless and that doesn't make someone 'crazy.' Chaplain affirmed the support of her daughters and when asked, shared that she is allowed to have an outside minister visit her. Pt expressed that she wants to die or at least be put to sleep for a couple months until this is over. When Chaplain asked some clarifying questions, Pt stated she doesn't actually want to die, but is tired and doesn't know what to do. Pt later stated again that she did not want to die and didn't know why she had said those things. When Pt said she didn't know how to keep from getting to this point again, Chaplain mentioned talking to a friend or therapist as a possible support. Chaplain prayed with Pt for strength and peace. Over the course of the visit there was a noticeable change in Pt's demeanor from defensiveness to more calm. Chaplain referred Pt to day chaplain for a follow-up visit.  This note was prepared by Chaplain Resident, Tacy Learn, MDiv. Chaplain remains available as needed through the on-call pager: [phone].

## 2021-06-14 NOTE — Evaluation (Signed)
SLP Cancellation Note  Patient Details Name: [name] MRN: [id number] DOB: Oct 07, 1951   Cancelled treatment:       Reason Eval/Treat Not Completed: Patient at procedure or test/unavailable (pt at IR, will continue efforts)  Rolena Infante, MS [facility] SLP Acute Rehab Services Office [phone] Pager [phone]  Chales Abrahams 06/14/2021, 10:45 AM

## 2021-06-14 NOTE — Procedures (Signed)
Interventional Radiology Procedure Note  Procedure: Pelvic abscessogram  Indication: Decreased output  Findings: Please refer to procedural dictation for full description.  Complications: None  EBL: < 10 mL  Acquanetta Belling, MD [phone]

## 2021-06-14 NOTE — Progress Notes (Signed)
Pt had CBC ordered for 1600, pt was receiving blood unable to get, blood ended at 1800 CBC rescheduled for 2000.

## 2021-06-14 NOTE — Progress Notes (Signed)
Nutrition Follow-up  DOCUMENTATION CODES:   Obesity unspecified  INTERVENTION:   - Hx of Celiac Disease; pt will require gluten-free diet once diet advanced   - Continue Ensure Enlive po BID, each supplement provides 350 kcal and 20 grams of protein  - Pt with Glucerna Shakes from home in room and can consume these as desired, each supplement provides 220 kcal and 10 grams of protein  - Encourage PO intake  - Provided diet education handouts regarding Celiac Disease to pt's daughter  NUTRITION DIAGNOSIS:   Inadequate oral intake related to altered GI function, acute illness as evidenced by NPO status.  Progressing with diet advancement  GOAL:   Patient will meet greater than or equal to 90% of their needs  Progressing  MONITOR:   PO intake, Supplement acceptance, Diet advancement, Labs, Weight trends, I & O's  REASON FOR ASSESSMENT:   Consult New TPN/TNA  ASSESSMENT:   70 year old female admitted with abdominal pain, N/V/D x 2 weeks with perforated sigmoid diverticulitis with abscess, also with PE. PMH includes CHF, HTN, DM, celiac disease.  6/12 - clear liquids 6/13 - full liquids 6/15 - NPO 6/16 - s/p EGD without evidence of active bleeding, s/p IVC filter placement by IR, PICC placed, TPN started @ 35 ml/hr 6/17 - clear liquids 6/20 - TPN advanced to 45 ml/hr 6/21 - NPO, s/p IR aspiration of intra-abdominal abscess and drain placement, TPN advanced to 65 ml/hr, clear liquids, later full liquids 6/22 - TPN weaned to 45 ml/hr, GI soft diet 6/23 - TPN d/c 6/28 - hematemesis, NPO 6/29 - s/p pelvic abscessogram showing no sign of fistula, s/p EGD with no source of active bleeding, clear liquids 6/30 - full liquids  Spoke with pt and daughter at bedside. Pt drinking broth from a cup at time of RD visit. Pt reports that her appetite was poor when she was consuming solid foods but that she thinks it is improving. Pt does not want to drink oral nutrition supplements  because she thinks that they contributed to her GI upset and diarrhea. Pt's daughter reports that it was not the Glucerna or Ensure that contributed to GI upset but that it was the probiotic. Pt's daughters to encourage PO intake and will encourage the pt to consume supplements. Pt has been advanced to a full liquid diet today so options have expanded.  Pt's daughter requesting information regarding diet for Celiac Disease. RD provided pt's daughter with 2 handouts from the Academy [employer].  Admit weight: 109.1 kg Current weight: 106.2 kg  Meal Completion: 0-80%  Medications reviewed and include: Ensure Enlive BID, SSI q 4 hours, IV solu-medrol, remeron, klor-con 40 mEq daily, florastor, simethicone, IV protonix, IV abx, IV KCl 10 mEq x 4 runs  Labs reviewed: potassium 3.2, hemoglobin 8.4 CBG's: 51-130 x 24 hours  NUTRITION - FOCUSED PHYSICAL EXAM:  Flowsheet Row Most Recent Value  Orbital Region Mild depletion  Upper Arm Region No depletion  Thoracic and Lumbar Region No depletion  Buccal Region Mild depletion  Temple Region Mild depletion  Clavicle Bone Region Mild depletion  Clavicle and Acromion Bone Region Mild depletion  Scapular Bone Region No depletion  Dorsal Hand No depletion  Patellar Region No depletion  Anterior Thigh Region No depletion  Posterior Calf Region Mild depletion  Edema (RD Assessment) Mild  Hair Reviewed  Eyes Reviewed  Mouth Reviewed  Skin Reviewed  Nails Reviewed       Diet Order:   Diet Order  Diet full liquid Room service appropriate? Yes; Fluid consistency: Thin  Diet effective now                   EDUCATION NEEDS:   Education needs have been addressed  Skin:  Skin Assessment: Skin Integrity Issues: Other: MASD to perineal region  Last BM:  06/14/21 smear type 6/type 7 via rectal tube  Height:   Ht Readings from Last 1 Encounters:  06/12/21 5\' 2"  (1.575 m)    Weight:   Wt Readings from  Last 1 Encounters:  06/12/21 106.2 kg    BMI:  Body mass index is 42.82 kg/m.  Estimated Nutritional Needs:   Kcal:  2000-2200 kcals  Protein:  110-120 grams  Fluid:  >/= 2 L    06/14/21, MS, RD, LDN Inpatient Clinical Dietitian Please see AMiON for contact information.

## 2021-06-14 NOTE — Progress Notes (Signed)
Inpatient Diabetes Program Recommendations  AACE/ADA: New Consensus Statement on Inpatient Glycemic Control (2015)  Target Ranges:  Prepandial:   less than 140 mg/dL      Peak postprandial:   less than 180 mg/dL (1-2 hours)      Critically ill patients:  140 - 180 mg/dL   Results for [name], [name] (MRN [id number]) as of 06/14/2021 07:26  Ref. Range 06/13/2021 08:08 06/13/2021 09:25 06/13/2021 11:13 06/13/2021 14:44  Glucose-Capillary Latest Ref Range: 70 - 99 mg/dL 70 75 86 867 (H)  10 units LANTUS @21 :34   Results for WINSTON, SOBCZYK [name] (MRN [name]) as of 06/14/2021 07:26  Ref. Range 06/14/2021 03:10  Glucose-Capillary Latest Ref Range: 70 - 99 mg/dL 68 (L)    Home DM Meds: Metformin 500 mg BID     Current Orders: Lantus 10 units QHS                           Novolog 0-20 units Q4 hours                           Solumedrol 8 mg Daily    MD- Note CBG 68 this AM.  Received 10 units Lantus last PM.  Please consider reducing the Lantus to 5 units QHS  May also consider reducing the Novolog SSi to the 0-15 unit scale Q4 hours    --Will follow patient during hospitalization--  06/16/2021 RN, MSN, CDE Diabetes Coordinator Inpatient Glycemic Control Team Team Pager: [phone] (8a-5p)

## 2021-06-14 NOTE — Progress Notes (Addendum)
Progress Note  13 Days Post-Op  Subjective: Events yesterday and last night noted - bloody emesis and stool. In good spirits at time of my visit and had just come back from IR. She has had no further nausea/emesis. She denies abdominal pain. She has been NPO  Objective: Vital signs in last 24 hours: Temp:  [97.6 F (36.4 C)-99.2 F (37.3 C)] 98.9 F (37.2 C) (06/29 0843) Pulse Rate:  [80-109] 89 (06/29 0843) Resp:  [11-26] 20 (06/29 0843) BP: (80-141)/(60-121) 98/73 (06/29 0843) SpO2:  [91 %-100 %] 93 % (06/29 0843) Last BM Date: 06/13/21  Intake/Output from previous day: 06/28 0701 - 06/29 0700 In: 1855 [P.O.:360; I.V.:655; VZCHY:850; IV Piggyback:100] Out: 600 [Urine:600] Intake/Output this shift: No intake/output data recorded.  PE: General: tearful, WD, female who is sitting up in bed in NAD HEENT: head is normocephalic, atraumatic. Mouth is pink and moist Heart:  Palpable radial pulses bilaterally Lungs: Respiratory effort nonlabored Abd: soft, NT, ND, +BS, drain with scant serosanguinous fluid MS: all 4 extremities are symmetrical with no cyanosis, clubbing, or edema. Skin: warm and dry with no masses, lesions, or rashes Psych: A&Ox3 with an appropriate affect.   Lab Results:  Recent Labs    06/13/21 2257 06/14/21 0632 06/14/21 0932  WBC 10.9* 12.2*  --   HGB 7.0* 9.5* 8.9*  HCT 22.6* 30.0* 27.2*  PLT 212 187  --     BMET Recent Labs    06/13/21 0627 06/14/21 0632  NA 139 140  K 3.0* 3.9  CL 100 111  CO2 25 23  GLUCOSE 68* 92  BUN 6* 16  CREATININE 0.90 0.98  CALCIUM 8.7* 8.1*    PT/INR No results for input(s): LABPROT, INR in the last 72 hours. CMP     Component Value Date/Time   NA 140 06/14/2021 0632   NA 141 11/07/2020 1058   K 3.9 06/14/2021 0632   CL 111 06/14/2021 0632   CO2 23 06/14/2021 0632   GLUCOSE 92 06/14/2021 0632   BUN 16 06/14/2021 0632   BUN 13 11/07/2020 1058   CREATININE 0.98 06/14/2021 0632   CALCIUM 8.1 (L)  06/14/2021 0632   PROT 4.1 (L) 06/14/2021 0632   PROT 6.5 11/07/2020 1058   ALBUMIN 2.0 (L) 06/14/2021 0632   ALBUMIN 4.3 11/07/2020 1058   AST 16 06/14/2021 0632   ALT 18 06/14/2021 0632   ALKPHOS 53 06/14/2021 0632   BILITOT 0.5 06/14/2021 0632   BILITOT 0.6 11/07/2020 1058   GFRNONAA >60 06/14/2021 0632   GFRAA 61 11/07/2020 1058   Lipase     Component Value Date/Time   LIPASE 19 05/26/2021 1601       Studies/Results: CT ABDOMEN PELVIS W CONTRAST  Result Date: 06/12/2021 CLINICAL DATA:  Intra-abdominal abscess. EXAM: CT ABDOMEN AND PELVIS WITH CONTRAST TECHNIQUE: Multidetector CT imaging of the abdomen and pelvis was performed using the standard protocol following bolus administration of intravenous contrast. CONTRAST:  OMNIPAQUE IOHEXOL 300 MG/ML  SOLN COMPARISON:  June 03, 2021. FINDINGS: Lower chest: No acute abnormality. Hepatobiliary: No focal liver abnormality is seen. No gallstones, gallbladder wall thickening, or biliary dilatation. Pancreas: Unremarkable. No pancreatic ductal dilatation or surrounding inflammatory changes. Spleen: Normal in size without focal abnormality. Adrenals/Urinary Tract: Adrenal glands are unremarkable. Kidneys are normal, without renal calculi, focal lesion, or hydronephrosis. Bladder is unremarkable. Stomach/Bowel: Stomach appears normal. No colonic dilatation is noted. No evidence of bowel obstruction is noted. Vascular/Lymphatic: Aortic atherosclerosis. No enlarged abdominal or pelvic  lymph nodes. IVC filter is noted in infrarenal position. Reproductive: Uterus and bilateral adnexa are unremarkable. Other: There is been interval placement of percutaneous drainage catheter into large pelvic abscess noted on prior exam. The abscess appears to be nearly completely decompressed. Musculoskeletal: No acute or significant osseous findings. IMPRESSION: Interval placement of percutaneous drainage catheter into large pelvic abscess noted on prior exam.  The abscess appears to be nearly completely decompressed. Electronically Signed   By: Lupita Raider M.D.   On: 06/12/2021 16:07   CT Angio Abd/Pel w/ and/or w/o  Result Date: 06/14/2021 CLINICAL DATA:  GI bleed EXAM: CTA ABDOMEN AND PELVIS WITHOUT AND WITH CONTRAST TECHNIQUE: Multidetector CT imaging of the abdomen and pelvis was performed using the standard protocol during bolus administration of intravenous contrast. Multiplanar reconstructed images and MIPs were obtained and reviewed to evaluate the vascular anatomy. CONTRAST:  OMNIPAQUE IOHEXOL 350 MG/ML SOLN COMPARISON:  None. FINDINGS: During the study, the injector shot off which stops the scanning. Only arterial phase imaging in the upper abdomen was obtained. The study was restarted, but this resulted in non arterial phase imaging through the pelvis. Therefore, the study is nondiagnostic for GI bleed. VASCULAR Aorta: Normal caliber.  Scattered calcifications. Celiac: Patent SMA: Patent Renals: Patent IMA: Patent Inflow: Calcifications in the iliac vessels. No aneurysm or dissection. Proximal Outflow: Grossly unremarkable Veins: No obvious venous abnormality within the limitations of this study. IVC filter in the infrarenal IVC. Review of the MIP images confirms the above findings. NON-VASCULAR Lower chest: No acute abnormality. Hepatobiliary: No focal hepatic abnormality. Gallbladder unremarkable. Pancreas: No focal abnormality or ductal dilatation. Spleen: No focal abnormality.  Normal size. Adrenals/Urinary Tract: No adrenal abnormality. No focal renal abnormality. No stones or hydronephrosis. Urinary bladder is unremarkable. Stomach/Bowel: Scattered colonic diverticula. No active extravasation seen within the transverse colon, hepatic flexure or splenic flexure. Cannot assess for contrast extravasation in the lower abdomen and pelvis. Lymphatic: No adenopathy Reproductive: Uterus and adnexa unremarkable.  No mass. Other: There is a midline  pigtail drainage catheter in place in the pelvis just superior to the urinary bladder. No surrounding fluid collection. No free fluid or free air. Musculoskeletal: No acute bony abnormality. IMPRESSION: VASCULAR Nondiagnostic study for GI bleed due to injector and scanner malfunction. This could be repeated in 24-48 hours if symptoms persist and felt clinically indicated. No evidence of aortic aneurysm or dissection. Scattered aortic and iliac calcifications. NON-VASCULAR Scattered colonic diverticulosis. Electronically Signed   By: Charlett Nose M.D.   On: 06/14/2021 00:39    Anti-infectives: Anti-infectives (From admission, onward)    Start     Dose/Rate Route Frequency Ordered Stop   05/27/21 0500  piperacillin-tazobactam (ZOSYN) IVPB 3.375 g        3.375 g 12.5 mL/hr over 240 Minutes Intravenous Every 8 hours 05/26/21 2126     05/26/21 2015  ceFEPIme (MAXIPIME) 2 g in sodium chloride 0.9 % 100 mL IVPB       See Hyperspace for full Linked Orders Report.   2 g 200 mL/hr over 30 Minutes Intravenous  Once 05/26/21 2004 05/26/21 2107   05/26/21 2015  metroNIDAZOLE (FLAGYL) IVPB 500 mg       See Hyperspace for full Linked Orders Report.   500 mg 100 mL/hr over 60 Minutes Intravenous  Once 05/26/21 2004 05/26/21 2135        Assessment/Plan  50F Diverticulitis with intra-abdominal abscess (9x5 cm) - afebrile, WBC 12.2, is on chronic steroids, monitor - Continue  drain  -  culture positive for pseudomonas sensitive to zosyn - Repeat CT with abscess nearly completely decompressed. - IR drain injection today - results not available at this time - No emergent surgical needs at this juncture - ID involved managing abx - hoping to switch to oral soon pending emesis - OOB/mobilize with therapies  - await GI reccs for diet but from our perspective okay to start back on clears  Hematemesis and bloody stool - CT yesterday nondiagnostic for GI bleed due to malfunction - hgb 9.5 s/p 2 units 6/29 -  GI planning EGD today   FEN: NPO, IVF ID: Zosyn 6/10>> VTE: SCD's Foley: none, external cath Dispo: SDU   - Below per primary service - Bilateral PE-heparin IV held due to hgb requiring transfusion. S/p IVCF 6/16 by IR Melena - EGD 6/16 normal esophagus, erythematous mucosa in the antrum, duodenitis but no active bleeding. GI signed off. Acute respiratory failure with hypoxia Thombocytopenia Chronic diastolic heart failure CKD IIIa HTN Graves dz DM2 Morbid obesity    LOS: 19 days    Eric Form, [facility] 06/14/2021, 10:37 AM Please see Amion for pager number during day hours 7:00am-4:30pm

## 2021-06-14 NOTE — Progress Notes (Signed)
OT Cancellation Note  Patient Details Name: [name] MRN: [id number] DOB: [birth year]   Cancelled Treatment:    Reason Eval/Treat Not Completed: Medical issues which prohibited therapy;Fatigue/lethargy limiting ability to participate (Pt very lethargic after upper endoscopy procedure. RN hold. OT to continue to follow.)  [name], OTR/L Acute Rehabilitation Services Pager: [phone] Office: [phone]     [name] 06/14/2021, 2:37 PM

## 2021-06-14 NOTE — Progress Notes (Signed)
[facility] Progress Note  Patient: [name]    STM:[id number]  DOA: 05/26/2021     Date of Service: the patient was seen and examined on 06/14/2021  Brief hospital course: Past medical history of HFpEF, HTN, OSA, T2DM, obesity, Graves' disease on prednisone taper.  Presents with complaints of abdominal pain. Found to have perforated diverticulitis and abscess.  Had intermittent melena as well. Underwent EGD twice. Currently plan is monitor for hemodynamic stability as well as transfusion for hemoglobin less than 7.  Subjective: No nausea no vomiting.  Appears anxious.  Occasionally cries without any reason.  Still has rectal tube with bloody bowel movement.  No abdominal pain.  No shortness of breath.  Assessment and Plan: 1.  Acute blood loss anemia Upper GI bleed Hemoglobin baseline around 12. Continues to drop. EGD was performed without any acute bleeding. Repeat EGD performed on 05/2018 due to persistent GI bleed negative for any acute ulcers or source of bleeding. CT abdomen performed was also negative to identify any source of bleeding. Per GI recommended continue IV PPI. Transfuse for hemoglobin less than 7 or hemodynamic instability. If the patient has another episode of massive bleed repeat CT abdomen to identify source of bleeding is recommended per GI.  2.  Perforated diverticulitis with an abscess. IR was consulted. Intra-abdominal drain placed. Drain injection per IR. On IV antibiotics. Continue pain control.  3.  Acute hypoxic respiratory failure, present on admission secondary to bilateral PE with RV strain. Oxygenation currently stable. Monitor. SP IVC filter placement. Not a candidate anticoagulation due to ongoing acute GI bleed.  4.  Type 2 diabetes mellitus, uncontrolled with hyperand hypoglycemia Continue sliding scale insulin.  5.  Hypothyroidism, Graves' disease, exophthalmos. On steroids at home as well as levothyroxine.  Saline will  resume levothyroxine.  Exam patient is on Solu-Medrol.  Will continue current dose.  6.  HFpEF No evidence of volume overload. Monitor.  7.  CKD 3A. Renal function stable. Monitor.  8.  Thrombocytopenia. Resolved. Monitor.  9.  Morbid obesity. BMI 42.8. Placing the patient at high risk poor outcome.  10.  Suspected depression. Poor p.o. intake/anorexia Will add Remeron.  11.  Hypotensive episode. Before EGD blood pressure dropped significantly to 70s. Patient was: 1 L normal saline bolus as well as 1 PRBC transfusion. Blood pressure improved after this.  Scheduled Meds:  sodium chloride   Intravenous Once   Chlorhexidine Gluconate Cloth  6 each Topical Daily   feeding supplement  237 mL Oral BID BM   insulin aspart  0-9 Units Subcutaneous Q4H   levothyroxine  112 mcg Oral Q0600   methylPREDNISolone (SOLU-MEDROL) injection  8 mg Intravenous Daily   mirtazapine  7.5 mg Oral QHS   potassium chloride  40 mEq Oral Daily   saccharomyces boulardii  250 mg Oral BID   simethicone  20 mg Oral QID   sodium chloride flush  10-40 mL Intracatheter Q12H   sodium chloride flush  5 mL Intracatheter Q8H   Continuous Infusions:  pantoprazole 8 mg/hr (06/14/21 1955)   piperacillin-tazobactam (ZOSYN)  IV 3.375 g (06/14/21 2011)   PRN Meds: Gerhardt's butt cream, liver oil-zinc oxide, morphine injection, ondansetron (ZOFRAN) IV, sodium chloride flush  Body mass index is 42.82 kg/m.  Nutrition Problem: Inadequate oral intake Etiology: altered GI function, acute illness     DVT Prophylaxis: SCD, pharmacological prophylaxis contraindicated due to GI bleed       Advance goals of care discussion: Pt is Full code.  Family Communication: family was present at bedside, at the time of interview.  The pt provided permission to discuss medical plan with the family. Opportunity was given to ask question and all questions were answered satisfactorily.   Data Reviewed: I have personally  reviewed and interpreted daily labs, tele strips, imaging. Hemoglobin dropped from 9.8-8.9.  Creatinine stable.  Physical Exam:  General: Appear in marked distress, no Rash; Oral Mucosa Clear, moist. no Abnormal Neck Mass Or lumps, Conjunctiva normal  Cardiovascular: S1 and S2 Present, no Murmur, Respiratory: good respiratory effort, Bilateral Air entry present and CTA, no Crackles, no wheezes Abdomen: Bowel Sound present, Soft and no tenderness Extremities: no Pedal edema Neurology: alert and oriented to time, place, and person affect anxious. no new focal deficit Gait not checked due to patient safety concerns  Vitals:   06/14/21 1312 06/14/21 1315 06/14/21 1439 06/14/21 1516  BP: 112/84 105/74 104/72 110/71  Pulse: (!) 101 96 80 84  Resp: 14 (!) 30 15 13   Temp:   98.2 F (36.8 C) 97.8 F (36.6 C)  TempSrc:   Oral Oral  SpO2: 95% 94% 99% 100%  Weight:      Height:        Disposition:  Status is: Inpatient  Remains inpatient appropriate because:Hemodynamically unstable, IV treatments appropriate due to intensity of illness or inability to take PO, and Inpatient level of care appropriate due to severity of illness  Dispo: The patient is from: Home              Anticipated d/c is to: SNF              Patient currently is not medically stable to d/c.   Difficult to place patient No        Time spent: 35 minutes. I reviewed all nursing notes, pharmacy notes, vitals, pertinent old records. I have discussed plan of care as described above with RN.  Author: , MD [facility] 06/14/2021 8:25 PM  To reach On-call, see care teams to locate the attending and reach out via [email or web address]. Between 7PM-7AM, please contact night-coverage If you still have difficulty reaching the attending provider, please page the [facility] (Director on Call) for [facility] on amion for assistance.

## 2021-06-14 NOTE — Progress Notes (Signed)
RN entered the room because pt was screaming and crying.  Pt yelling, "I want to see the chaplain!"  RN tried to sooth patient and RN did call chaplain and ask her to come to see the patient.  Patient upset crying and shaking fists at [place], RN called daughter to see if she could calm her or offer her some emotional comfort.  Daughter heard patient and is on her way to try and calm her mother.  RN outside room awaiting the arrival of the chaplain.

## 2021-06-14 NOTE — Progress Notes (Signed)
An attempt to perform the EGD at this time was not possible.  She is currently in IR.  The records show that she is hemodynamically stable with the blood transfusions.  The EGD will be performed at 12:30 PM today.

## 2021-06-14 NOTE — Progress Notes (Signed)
PT Cancellation Note  Patient Details Name: Addeline Calarco MRN: [id number] DOB: Feb 24, 1951   Cancelled Treatment:    Reason Eval/Treat Not Completed: Other (comment) Pt recently back from IR; not appropriate per RN due to lethargy.  Lillia Pauls, PT, DPT Acute Rehabilitation Services Pager [phone] Office [phone]    Norval Morton 06/14/2021, 2:31 PM

## 2021-06-15 ENCOUNTER — Encounter (HOSPITAL_COMMUNITY): Payer: Self-pay | Admitting: Gastroenterology

## 2021-06-15 LAB — CBC
HCT: 25 % — ABNORMAL LOW (ref 36.0–46.0)
HCT: 27.6 % — ABNORMAL LOW (ref 36.0–46.0)
Hemoglobin: 8.5 g/dL — ABNORMAL LOW (ref 12.0–15.0)
Hemoglobin: 9.1 g/dL — ABNORMAL LOW (ref 12.0–15.0)
MCH: 31.1 pg (ref 26.0–34.0)
MCH: 32.2 pg (ref 26.0–34.0)
MCHC: 33 g/dL (ref 30.0–36.0)
MCHC: 34 g/dL (ref 30.0–36.0)
MCV: 94.2 fL (ref 80.0–100.0)
MCV: 94.7 fL (ref 80.0–100.0)
Platelets: 131 10*3/uL — ABNORMAL LOW (ref 150–400)
Platelets: 169 10*3/uL (ref 150–400)
RBC: 2.64 MIL/uL — ABNORMAL LOW (ref 3.87–5.11)
RBC: 2.93 MIL/uL — ABNORMAL LOW (ref 3.87–5.11)
RDW: 18 % — ABNORMAL HIGH (ref 11.5–15.5)
RDW: 18.6 % — ABNORMAL HIGH (ref 11.5–15.5)
WBC: 11.5 10*3/uL — ABNORMAL HIGH (ref 4.0–10.5)
WBC: 12.6 10*3/uL — ABNORMAL HIGH (ref 4.0–10.5)
nRBC: 0.3 % — ABNORMAL HIGH (ref 0.0–0.2)
nRBC: 0.3 % — ABNORMAL HIGH (ref 0.0–0.2)

## 2021-06-15 LAB — COMPREHENSIVE METABOLIC PANEL
ALT: 17 U/L (ref 0–44)
AST: 16 U/L (ref 15–41)
Albumin: 1.9 g/dL — ABNORMAL LOW (ref 3.5–5.0)
Alkaline Phosphatase: 44 U/L (ref 38–126)
Anion gap: 6 (ref 5–15)
BUN: 11 mg/dL (ref 8–23)
CO2: 23 mmol/L (ref 22–32)
Calcium: 7.9 mg/dL — ABNORMAL LOW (ref 8.9–10.3)
Chloride: 109 mmol/L (ref 98–111)
Creatinine, Ser: 0.76 mg/dL (ref 0.44–1.00)
GFR, Estimated: >60 mL/min (ref >60)
Glucose, Bld: 91 mg/dL (ref 70–99)
Potassium: 3.2 mmol/L — ABNORMAL LOW (ref 3.5–5.1)
Sodium: 138 mmol/L (ref 135–145)
Total Bilirubin: 0.5 mg/dL (ref 0.3–1.2)
Total Protein: 3.8 g/dL — ABNORMAL LOW (ref 6.5–8.1)

## 2021-06-15 LAB — CBC WITH DIFFERENTIAL/PLATELET
Abs Immature Granulocytes: 0.07 10*3/uL (ref 0.00–0.07)
Basophils Absolute: 0.1 10*3/uL (ref 0.0–0.1)
Basophils Relative: 1 %
Eosinophils Absolute: 0.1 10*3/uL (ref 0.0–0.5)
Eosinophils Relative: 1 %
HCT: 26.4 % — ABNORMAL LOW (ref 36.0–46.0)
Hemoglobin: 8.4 g/dL — ABNORMAL LOW (ref 12.0–15.0)
Immature Granulocytes: 1 %
Lymphocytes Relative: 16 %
Lymphs Abs: 1.9 10*3/uL (ref 0.7–4.0)
MCH: 30.5 pg (ref 26.0–34.0)
MCHC: 31.8 g/dL (ref 30.0–36.0)
MCV: 96 fL (ref 80.0–100.0)
Monocytes Absolute: 1 10*3/uL (ref 0.1–1.0)
Monocytes Relative: 8 %
Neutro Abs: 8.5 10*3/uL — ABNORMAL HIGH (ref 1.7–7.7)
Neutrophils Relative %: 73 %
Platelets: 141 10*3/uL — ABNORMAL LOW (ref 150–400)
RBC: 2.75 MIL/uL — ABNORMAL LOW (ref 3.87–5.11)
RDW: 18.6 % — ABNORMAL HIGH (ref 11.5–15.5)
WBC: 11.6 10*3/uL — ABNORMAL HIGH (ref 4.0–10.5)
nRBC: 0.5 % — ABNORMAL HIGH (ref 0.0–0.2)

## 2021-06-15 LAB — GLUCOSE, CAPILLARY
Glucose-Capillary: 119 mg/dL — ABNORMAL HIGH (ref 70–99)
Glucose-Capillary: 131 mg/dL — ABNORMAL HIGH (ref 70–99)
Glucose-Capillary: 51 mg/dL — ABNORMAL LOW (ref 70–99)
Glucose-Capillary: 55 mg/dL — ABNORMAL LOW (ref 70–99)
Glucose-Capillary: 71 mg/dL (ref 70–99)
Glucose-Capillary: 95 mg/dL (ref 70–99)
Glucose-Capillary: 99 mg/dL (ref 70–99)

## 2021-06-15 LAB — TYPE AND SCREEN
ABO/RH(D): B POS
Antibody Screen: NEGATIVE
Unit division: 0
Unit division: 0
Unit division: 0

## 2021-06-15 LAB — BPAM RBC
Blood Product Expiration Date: 202207172359
Blood Product Expiration Date: 202207182359
Blood Product Expiration Date: 202207182359
ISSUE DATE / TIME: 202206290023
ISSUE DATE / TIME: 202206290315
ISSUE DATE / TIME: 202206291450
Unit Type and Rh: 7300
Unit Type and Rh: 7300
Unit Type and Rh: 7300

## 2021-06-15 MED ORDER — POTASSIUM CHLORIDE 10 MEQ/100ML IV SOLN
10.0000 meq | INTRAVENOUS | Status: AC
  Administered 2021-06-15 (×4): 10 meq via INTRAVENOUS
  Filled 2021-06-15 (×4): qty 100

## 2021-06-15 MED ORDER — DEXTROSE 50 % IV SOLN
INTRAVENOUS | Status: AC
  Administered 2021-06-15: 50 mL
  Filled 2021-06-15: qty 50

## 2021-06-15 NOTE — Progress Notes (Signed)
[facility] Progress Note  Patient: [name]    GHW:[id number]  DOA: 05/26/2021     Date of Service: the patient was seen and examined on 06/15/2021  Brief hospital course: Past medical history of HFpEF, HTN, OSA, T2DM, obesity, Graves' disease on prednisone taper.  Presents with complaints of abdominal pain. Found to have perforated diverticulitis and abscess.  Had intermittent melena as well. EGD on 6/16 and 6/29.  No significant evidence of bleeding seen. Currently plan is transfer for hemoglobin less than 7, monitor for hemodynamic stability.  Subjective: Currently continues to have melanotic stool.  No nausea no vomiting but no fever no chills.  Mood improving.  Assessment and Plan: 1.  Acute blood loss anemia Upper GI bleed Hemoglobin baseline around 12. EGD 6/16 normal esophagus duodenitis. EGD 6/29 normal esophagus erythematous duodenopathy as well as enteropathy.  Hematin in gastric body. Rectal bag still has melanotic stool. Not a candidate for colonoscopy due to diverticulitis. Not a candidate for capsule endoscopy given per GI as well. Per GI recommended continue IV PPI. If the patient has significant hematochezia CT angiogram should be performed. If the patient continues to have melena with slow drop in hemoglobin nuclear medicine RBC scan should be performed. Transfuse for hemoglobin less than 7 or hemodynamic instability. Currently on full liquid diet.  2.  Perforated diverticulitis with an abscess. Initial attempt for drain placement were not successful. Repeat CT scan on 6/18 shows increasing abscess size. On 6/21 IR placed a drain. Cultures growing pansensitive Pseudomonas.  ID consulted. Currently plan is to continue IV Zosyn for now.  Transition to p.o. Levaquin when the patient is able to tolerate p.o. adequately 750 mg daily. Continue for 10 days of antibiotic after removal of the IR drain. No indication for ID follow-up. Continue pain  control.   3.  Acute hypoxic respiratory failure, present on admission secondary to bilateral PE with RV strain. Acute thrombocytopenia-secondary to consumption in clot, resolved PE seen on initial CT scan on admission. Patient was started on IV heparin. PCCM was notified as the patient had RV strain on echocardiogram.  Recommended to continue anticoagulation and outpatient follow-up with pulmonology Dr. Judeth Horn. Due to GI bleed anticoagulation was discontinued. IVC filter placed on 6/16 DVT prophylaxis was initiated after stabilization of GI bleed with Lovenox on 6/23.   Discontinue on 6/28 again due to GI bleed Oxygenation currently stable. Not a candidate anticoagulation due to ongoing acute GI bleed.   4.  Type 2 diabetes mellitus, uncontrolled with hyper and hypoglycemia without any complication Continue sliding scale insulin.   5.  Hypothyroidism, Graves' disease, exophthalmos. On steroids at home as well as levothyroxine. Will resume levothyroxine. Per endocrine patient was started on prednisone taper, in the hospital patient is on Solu-Medrol.  Will continue current dose.   6.  HFpEF No evidence of volume overload. Monitor.   7.  CKD 3A. Renal function stable. Monitor.   8.  Morbid obesity. Body mass index is 42.82 kg/m.  Placing the patient at high risk poor outcome. May have undiagnosed sleep apnea as well.  Will benefit from diagnostic evaluation outpatient.   10.  Suspected depression.  Poor p.o. intake/anorexia Will add Remeron.   11.  Hypotensive episode.  6/29 resolved Before EGD blood pressure dropped significantly to 70s. Patient was: 1 L normal saline bolus as well as 1 PRBC transfusion. Blood pressure improved after this.  Scheduled Meds:  sodium chloride   Intravenous Once   Chlorhexidine Gluconate Cloth  6 each Topical Daily   feeding supplement  237 mL Oral BID BM   insulin aspart  0-9 Units Subcutaneous Q4H   levothyroxine  112 mcg Oral Q0600    methylPREDNISolone (SOLU-MEDROL) injection  8 mg Intravenous Daily   mirtazapine  7.5 mg Oral QHS   potassium chloride  40 mEq Oral Daily   saccharomyces boulardii  250 mg Oral BID   simethicone  20 mg Oral QID   sodium chloride flush  10-40 mL Intracatheter Q12H   sodium chloride flush  5 mL Intracatheter Q8H   Continuous Infusions:  pantoprazole 8 mg/hr (06/15/21 1730)   piperacillin-tazobactam (ZOSYN)  IV 3.375 g (06/15/21 1438)   PRN Meds: Gerhardt's butt cream, liver oil-zinc oxide, morphine injection, ondansetron (ZOFRAN) IV, sodium chloride flush  Body mass index is 42.82 kg/m.  Nutrition Problem: Inadequate oral intake Etiology: altered GI function, acute illness     DVT Prophylaxis: SCD, pharmacological prophylaxis contraindicated due to GI bleed       Advance goals of care discussion: Pt is Full code.  Family Communication: family was present at bedside, at the time of interview.  The pt provided permission to discuss medical plan with the family. Opportunity was given to ask question and all questions were answered satisfactorily.   Data Reviewed: I have personally reviewed and interpreted daily labs, tele strips, imaging. Hemoglobin remaining stable.  9.5-8.9-8.5-8.4-9.1. Mild leukocytosis. Mild hypokalemia. Hypoalbuminemia persistent.  Physical Exam:  General: Appear in mild distress, no Rash; Oral Mucosa Clear, moist. no Abnormal Neck Mass Or lumps, Conjunctiva normal exophthalmos present Cardiovascular: S1 and S2 Present, no Murmur, Respiratory: good respiratory effort, Bilateral Air entry present and CTA, no Crackles, no wheezes Abdomen: Bowel Sound present, Soft and no tenderness Extremities: no Pedal edema Neurology: alert and oriented to time, place, and person affect appropriate. no new focal deficit Gait not checked due to patient safety concerns  Vitals:   06/14/21 2301 06/15/21 0750 06/15/21 1157 06/15/21 1549  BP: 102/72 115/75 108/75 121/81   Pulse: 85 83 89 83  Resp: 15 14 17 14   Temp: 97.7 F (36.5 C) 97.7 F (36.5 C) 98.1 F (36.7 C) 97.9 F (36.6 C)  TempSrc: Oral Oral Oral Oral  SpO2: 95% 93% 100% 99%  Weight:      Height:        Disposition:  Status is: Inpatient  Remains inpatient appropriate because:Ongoing diagnostic testing needed not appropriate for outpatient work up  Dispo: The patient is from: Home              Anticipated d/c is to: SNF              Patient currently is not medically stable to d/c.   Difficult to place patient No        Time spent: 35 minutes. I reviewed all nursing notes, pharmacy notes, vitals, pertinent old records. I have discussed plan of care as described above with RN.  Author: , MD [facility] 06/15/2021 8:11 PM  To reach On-call, see care teams to locate the attending and reach out via [email or web address]. Between 7PM-7AM, please contact night-coverage If you still have difficulty reaching the attending provider, please page the [facility] (Director on Call) for [facility] on amion for assistance.

## 2021-06-15 NOTE — Progress Notes (Signed)
Physical Therapy Treatment Patient Details Name: [name] MRN: [id number] DOB: March 03, 1951 Today's Date: 06/15/2021    History of Present Illness 70 y.o. female admitted on 05/26/21 for sigmoid diverticulitis with perforation and abscess, IR consulted for surgery vs drain placement and had an unsucessful attempt on 05/28/21.  Bil PE started on IV heparin. S/p CT guided placement of a 10 Fr drainage catheter placement into the diverticular abscess within the midline of the lower abdomen/upper pelvis 6/21. S/p pelvic abscessogram 6/29. PMH including anemia, CHF, DM, graves disease, HTN, obesity, stroke, seizure, and thyroid disease.    PT Comments    Pt continues to display compensatory mechanisms during gait to accommodate for her hip weakness, thus focused on hip strengthening exercises prior to gait bouts to encourage carryover. Fair success noted as pt was able to clear her feet better for brief periods of time today. She continues to be limited by generalized weakness, balance deficits, and aerobic endurance (HR up to 146 bpm needing cues to sit and rest with mobility). Pt is at high risk for falls. Will continue to follow acutely. Current recommendations remain appropriate.   Follow Up Recommendations  CIR     Equipment Recommendations  Rolling walker with 5" wheels;Wheelchair (measurements PT);Wheelchair cushion (measurements PT);Hospital bed    Recommendations for Other Services       Precautions / Restrictions Precautions Precautions: Fall Precaution Comments: flexiseal, R gravity drain Restrictions Weight Bearing Restrictions: No    Mobility  Bed Mobility Overal bed mobility: Needs Assistance Bed Mobility: Supine to Sit     Supine to sit: HOB elevated;Min assist     General bed mobility comments: Increased time managing legs off bed and using bed rails to ascend trunk with HOB significantly elevated and pt initially performing long sit then managing legs to EOB,  minA to complete.    Transfers Overall transfer level: Needs assistance Equipment used: Rolling walker (2 wheeled) Transfers: Sit to/from Stand Sit to Stand: Mod assist;Min assist         General transfer comment: Cues for hand placement, use of momentum to power up into standing, up to modA to achieve upright. Needs several attempts before successful the first transfer rep.  Ambulation/Gait Ambulation/Gait assistance: Min assist Gait Distance (Feet): 30 Feet (x2 bouts of ~30 ft each bout) Assistive device: Rolling walker (2 wheeled) Gait Pattern/deviations: Decreased stride length;Trunk flexed;Step-through pattern;Shuffle Gait velocity: decreased Gait velocity interpretation: <1.31 ft/sec, indicative of household ambulator General Gait Details: Pt with trunk flexed, taking small slow steps. Pt with poor feet clearance, cues to correct, mod success. Compensates to clear feet through rotating hips and leaning excessively laterally.   Stairs             Wheelchair Mobility    Modified Rankin (Stroke Patients Only)       Balance Overall balance assessment: Needs assistance Sitting-balance support: No upper extremity supported;Feet supported Sitting balance-Leahy Scale: Fair     Standing balance support: Bilateral upper extremity supported;During functional activity Standing balance-Leahy Scale: Poor Standing balance comment: reliant on support in standing.                            Cognition Arousal/Alertness: Awake/alert Behavior During Therapy: WFL for tasks assessed/performed Overall Cognitive Status: Impaired/Different from baseline Area of Impairment: Memory;Following commands;Safety/judgement;Problem solving;Orientation;Attention                 Orientation Level: Disoriented to;Time Current Attention Level: Sustained  Memory: Decreased short-term memory Following Commands: Follows one step commands with increased time;Follows one step  commands consistently;Follows multi-step commands inconsistently Safety/Judgement: Decreased awareness of safety Awareness: Intellectual Problem Solving: Difficulty sequencing;Requires verbal cues;Requires tactile cues;Slow processing General Comments: Needs cues for recall and carryover with sequencing of tasks. Disoriented to day, stating it was in June 2022 but unsure of which day exactly, stating it was in the 20s.      Exercises General Exercises - Lower Extremity Long Arc Quad: Both;10 reps;Seated (focusing on eccentric control) Hip ABduction/ADduction: Both;10 reps;Seated Hip Flexion/Marching: Both;Seated;15 reps    General Comments        Pertinent Vitals/Pain Pain Assessment: Faces Faces Pain Scale: Hurts a little bit Pain Location: anterior tighs, abdomen Pain Descriptors / Indicators: Discomfort;Pins and needles Pain Intervention(s): Monitored during session;Limited activity within patient's tolerance;Repositioned    Home Living                      Prior Function            PT Goals (current goals can now be found in the care plan section) Acute Rehab PT Goals Patient Stated Goal: to improve PT Goal Formulation: With patient Time For Goal Achievement: 06/27/21 Potential to Achieve Goals: Good Progress towards PT goals: Progressing toward goals    Frequency    Min 3X/week      PT Plan Frequency needs to be updated;Current plan remains appropriate    Co-evaluation              AM-PAC PT "6 Clicks" Mobility   Outcome Measure  Help needed turning from your back to your side while in a flat bed without using bedrails?: A Little Help needed moving from lying on your back to sitting on the side of a flat bed without using bedrails?: A Little Help needed moving to and from a bed to a chair (including a wheelchair)?: A Little Help needed standing up from a chair using your arms (e.g., wheelchair or bedside chair)?: A Lot Help needed to walk  in hospital room?: A Little Help needed climbing 3-5 steps with a railing? : Total 6 Click Score: 15    End of Session Equipment Utilized During Treatment: Gait belt Activity Tolerance: Patient tolerated treatment well Patient left: with call bell/phone within reach;in chair;with chair alarm set;with family/visitor present   PT Visit Diagnosis: Muscle weakness (generalized) (M62.81);Difficulty in walking, not elsewhere classified (R26.2);Unsteadiness on feet (R26.81);Other abnormalities of gait and mobility (R26.89)     Time: 3300-7622 PT Time Calculation (min) (ACUTE ONLY): 31 min  Charges:  $Gait Training: 8-22 mins $Therapeutic Exercise: 8-22 mins                     Raymond Gurney, PT, DPT Acute Rehabilitation Services  Pager: [phone] Office: [phone]    Jewel Baize 06/15/2021, 11:05 AM

## 2021-06-15 NOTE — Progress Notes (Signed)
Progress Note  1 Day Post-Op  Subjective: Doing well after drain study and EGD yesterday. Having good intake of clears without nausea, emesis, or abdominal pain.   Objective: Vital signs in last 24 hours: Temp:  [97.7 F (36.5 C)-99.3 F (37.4 C)] 97.7 F (36.5 C) (06/30 0750) Pulse Rate:  [80-101] 83 (06/30 0750) Resp:  [8-30] 14 (06/30 0750) BP: (79-115)/(59-89) 115/75 (06/30 0750) SpO2:  [92 %-100 %] 93 % (06/30 0750) Last BM Date: 06/13/21  Intake/Output from previous day: 06/29 0701 - 06/30 0700 In: 452.5 [Blood:447.5] Out: 212 [Urine:200; Drains:12] Intake/Output this shift: No intake/output data recorded.  PE: General: tearful, WD, female who is sitting up in bed in NAD HEENT: head is normocephalic, atraumatic. Mouth is pink and moist Heart:  Palpable radial pulses bilaterally Lungs: Respiratory effort nonlabored Abd: soft, NT, ND, +BS, drain with scant serosanguinous fluid MS: all 4 extremities are symmetrical with no cyanosis, clubbing, or edema. Skin: warm and dry with no masses, lesions, or rashes Psych: A&Ox3 with an appropriate affect.   Lab Results:  Recent Labs    06/15/21 0045 06/15/21 0500  WBC 11.5* 11.6*  HGB 8.5* 8.4*  HCT 25.0* 26.4*  PLT 131* 141*    BMET Recent Labs    06/14/21 0632 06/15/21 0500  NA 140 138  K 3.9 3.2*  CL 111 109  CO2 23 23  GLUCOSE 92 91  BUN 16 11  CREATININE 0.98 0.76  CALCIUM 8.1* 7.9*    PT/INR No results for input(s): LABPROT, INR in the last 72 hours. CMP     Component Value Date/Time   NA 138 06/15/2021 0500   NA 141 11/07/2020 1058   K 3.2 (L) 06/15/2021 0500   CL 109 06/15/2021 0500   CO2 23 06/15/2021 0500   GLUCOSE 91 06/15/2021 0500   BUN 11 06/15/2021 0500   BUN 13 11/07/2020 1058   CREATININE 0.76 06/15/2021 0500   CALCIUM 7.9 (L) 06/15/2021 0500   PROT 3.8 (L) 06/15/2021 0500   PROT 6.5 11/07/2020 1058   ALBUMIN 1.9 (L) 06/15/2021 0500   ALBUMIN 4.3 11/07/2020 1058   AST 16  06/15/2021 0500   ALT 17 06/15/2021 0500   ALKPHOS 44 06/15/2021 0500   BILITOT 0.5 06/15/2021 0500   BILITOT 0.6 11/07/2020 1058   GFRNONAA >60 06/15/2021 0500   GFRAA 61 11/07/2020 1058   Lipase     Component Value Date/Time   LIPASE 19 05/26/2021 1601       Studies/Results: IR Sinus/Fist Tube Chk-Non GI  Result Date: 06/14/2021 INDICATION: 70 year old woman status post pelvic abscess drain placement on 06/16/2021 returns for obsessive g given decreased output. EXAM: SINUS TRACT INJECTION / FISTULOGRAM COMPARISON:  None. MEDICATIONS: The patient is currently admitted to the hospital and receiving intravenous antibiotics. The antibiotics were administered within an appropriate time frame prior to the initiation of the procedure. ANESTHESIA/SEDATION: None COMPLICATIONS: None immediate. TECHNIQUE: Informed written consent was obtained from the patient after a thorough discussion of the procedural risks, benefits and alternatives. A timeout was performed prior to the initiation of the procedure. PROCEDURE: Patient positioned supine on the procedure table. Scout image demonstrated pelvic drain in appropriate position. Contrast administered through the drain demonstrated a residual abscess cavity measuring at least 20 mL in size. No fistulous communication is identified. IMPRESSION: Pelvic abscessogram demonstrates significant residual cavity. No fistulous communication identified. Drain left in place given persistent cavity. PLAN: Consider abscessogram and/or drain removal once net daily output is less  than 10 mL. Electronically Signed   By: Acquanetta Belling M.D.   On: 06/14/2021 14:12   CT Angio Abd/Pel w/ and/or w/o  Result Date: 06/14/2021 CLINICAL DATA:  GI bleed EXAM: CTA ABDOMEN AND PELVIS WITHOUT AND WITH CONTRAST TECHNIQUE: Multidetector CT imaging of the abdomen and pelvis was performed using the standard protocol during bolus administration of intravenous contrast. Multiplanar  reconstructed images and MIPs were obtained and reviewed to evaluate the vascular anatomy. CONTRAST:  OMNIPAQUE IOHEXOL 350 MG/ML SOLN COMPARISON:  None. FINDINGS: During the study, the injector shot off which stops the scanning. Only arterial phase imaging in the upper abdomen was obtained. The study was restarted, but this resulted in non arterial phase imaging through the pelvis. Therefore, the study is nondiagnostic for GI bleed. VASCULAR Aorta: Normal caliber.  Scattered calcifications. Celiac: Patent SMA: Patent Renals: Patent IMA: Patent Inflow: Calcifications in the iliac vessels. No aneurysm or dissection. Proximal Outflow: Grossly unremarkable Veins: No obvious venous abnormality within the limitations of this study. IVC filter in the infrarenal IVC. Review of the MIP images confirms the above findings. NON-VASCULAR Lower chest: No acute abnormality. Hepatobiliary: No focal hepatic abnormality. Gallbladder unremarkable. Pancreas: No focal abnormality or ductal dilatation. Spleen: No focal abnormality.  Normal size. Adrenals/Urinary Tract: No adrenal abnormality. No focal renal abnormality. No stones or hydronephrosis. Urinary bladder is unremarkable. Stomach/Bowel: Scattered colonic diverticula. No active extravasation seen within the transverse colon, hepatic flexure or splenic flexure. Cannot assess for contrast extravasation in the lower abdomen and pelvis. Lymphatic: No adenopathy Reproductive: Uterus and adnexa unremarkable.  No mass. Other: There is a midline pigtail drainage catheter in place in the pelvis just superior to the urinary bladder. No surrounding fluid collection. No free fluid or free air. Musculoskeletal: No acute bony abnormality. IMPRESSION: VASCULAR Nondiagnostic study for GI bleed due to injector and scanner malfunction. This could be repeated in 24-48 hours if symptoms persist and felt clinically indicated. No evidence of aortic aneurysm or dissection. Scattered aortic and  iliac calcifications. NON-VASCULAR Scattered colonic diverticulosis. Electronically Signed   By: Charlett Nose M.D.   On: 06/14/2021 00:39    Anti-infectives: Anti-infectives (From admission, onward)    Start     Dose/Rate Route Frequency Ordered Stop   05/27/21 0500  piperacillin-tazobactam (ZOSYN) IVPB 3.375 g        3.375 g 12.5 mL/hr over 240 Minutes Intravenous Every 8 hours 05/26/21 2126     05/26/21 2015  ceFEPIme (MAXIPIME) 2 g in sodium chloride 0.9 % 100 mL IVPB       See Hyperspace for full Linked Orders Report.   2 g 200 mL/hr over 30 Minutes Intravenous  Once 05/26/21 2004 05/26/21 2107   05/26/21 2015  metroNIDAZOLE (FLAGYL) IVPB 500 mg       See Hyperspace for full Linked Orders Report.   500 mg 100 mL/hr over 60 Minutes Intravenous  Once 05/26/21 2004 05/26/21 2135        Assessment/Plan  15F Diverticulitis with intra-abdominal abscess (9x5 cm) - afebrile, WBC 11.5, is on chronic steroids, monitor -  culture positive for pseudomonas sensitive to zosyn - Repeat CT 6/27 with abscess nearly completely decompressed. - IR drain injection 6/29 - no fistula but cavity remains. Continue drain - No emergent surgical needs at this juncture - ID involved managing abx - hoping to switch to oral soon pending emesis - OOB/mobilize with therapies  - tolerating clears  Hematemesis and bloody stool - CT yesterday nondiagnostic for GI  bleed due to malfunction - hgb 8.5 s/p 2 units 6/29 - EGD 6/29 without source of acute bleeding. Repeat CT abd if further bleeding episodes - transfuse as needed for Hgb <7   FEN: CLD ADAT FLD, IVF ID: Zosyn 6/10>> VTE: SCD's Foley: none, external cath Dispo: SDU   - Below per primary service - Bilateral PE-heparin IV held due to hgb requiring transfusion. S/p IVCF 6/16 by IR Melena - EGD 6/16 normal esophagus, erythematous mucosa in the antrum, duodenitis but no active bleeding. GI signed off. Acute respiratory failure with  hypoxia Thombocytopenia Chronic diastolic heart failure CKD IIIa HTN Graves dz DM2 Morbid obesity    LOS: 20 days    Eric Form, [facility] 06/15/2021, 9:07 AM Please see Amion for pager number during day hours 7:00am-4:30pm

## 2021-06-15 NOTE — Progress Notes (Signed)
Inpatient Rehab Admissions Coordinator:   Awaiting results of expedited appeal.  Should have answer by tomorrow at the latest.   Estill Dooms, PT, DPT Admissions Coordinator [phone] 06/15/21  11:03 AM

## 2021-06-15 NOTE — Progress Notes (Signed)
Subjective: No acute events.  Objective: Vital signs in last 24 hours: Temp:  [97.7 F (36.5 C)-98.6 F (37 C)] 98.1 F (36.7 C) (06/30 1157) Pulse Rate:  [80-101] 89 (06/30 1157) Resp:  [8-30] 17 (06/30 1157) BP: (79-115)/(59-89) 108/75 (06/30 1157) SpO2:  [92 %-100 %] 100 % (06/30 1157) Last BM Date: 06/13/21  Intake/Output from previous day: 06/29 0701 - 06/30 0700 In: 452.5 [Blood:447.5] Out: 212 [Urine:200; Drains:12] Intake/Output this shift: Total I/O In: 400 [P.O.:400] Out: 500 [Urine:500]  General appearance: alert and no distress GI: soft, non-tender; bowel sounds normal; no masses,  no organomegaly  Lab Results: Recent Labs    06/14/21 0632 06/14/21 0932 06/15/21 0045 06/15/21 0500  WBC 12.2*  --  11.5* 11.6*  HGB 9.5* 8.9* 8.5* 8.4*  HCT 30.0* 27.2* 25.0* 26.4*  PLT 187  --  131* 141*   BMET Recent Labs    06/13/21 0627 06/14/21 0632 06/15/21 0500  NA 139 140 138  K 3.0* 3.9 3.2*  CL 100 111 109  CO2 25 23 23   GLUCOSE 68* 92 91  BUN 6* 16 11  CREATININE 0.90 0.98 0.76  CALCIUM 8.7* 8.1* 7.9*   LFT Recent Labs    06/15/21 0500  PROT 3.8*  ALBUMIN 1.9*  AST 16  ALT 17  ALKPHOS 44  BILITOT 0.5   PT/INR No results for input(s): LABPROT, INR in the last 72 hours. Hepatitis Panel No results for input(s): HEPBSAG, HCVAB, HEPAIGM, HEPBIGM in the last 72 hours. C-Diff No results for input(s): CDIFFTOX in the last 72 hours. Fecal Lactopherrin No results for input(s): FECLLACTOFRN in the last 72 hours.  Studies/Results: IR Sinus/Fist Tube Chk-Non GI  Result Date: 06/14/2021 INDICATION: 70 year old woman status post pelvic abscess drain placement on 06/16/2021 returns for obsessive g given decreased output. EXAM: SINUS TRACT INJECTION / FISTULOGRAM COMPARISON:  None. MEDICATIONS: The patient is currently admitted to the hospital and receiving intravenous antibiotics. The antibiotics were administered within an appropriate time frame prior  to the initiation of the procedure. ANESTHESIA/SEDATION: None COMPLICATIONS: None immediate. TECHNIQUE: Informed written consent was obtained from the patient after a thorough discussion of the procedural risks, benefits and alternatives. A timeout was performed prior to the initiation of the procedure. PROCEDURE: Patient positioned supine on the procedure table. Scout image demonstrated pelvic drain in appropriate position. Contrast administered through the drain demonstrated a residual abscess cavity measuring at least 20 mL in size. No fistulous communication is identified. IMPRESSION: Pelvic abscessogram demonstrates significant residual cavity. No fistulous communication identified. Drain left in place given persistent cavity. PLAN: Consider abscessogram and/or drain removal once net daily output is less than 10 mL. Electronically Signed   By: 08/17/2021 M.[name].   On: 06/14/2021 14:12   CT Angio Abd/Pel w/ and/or w/o  Result Date: 06/14/2021 CLINICAL DATA:  GI bleed EXAM: CTA ABDOMEN AND PELVIS WITHOUT AND WITH CONTRAST TECHNIQUE: Multidetector CT imaging of the abdomen and pelvis was performed using the standard protocol during bolus administration of intravenous contrast. Multiplanar reconstructed images and MIPs were obtained and reviewed to evaluate the vascular anatomy. CONTRAST:  06/16/2021 OMNIPAQUE IOHEXOL 350 MG/ML SOLN COMPARISON:  None. FINDINGS: During the study, the injector shot off which stops the scanning. Only arterial phase imaging in the upper abdomen was obtained. The study was restarted, but this resulted in non arterial phase imaging through the pelvis. Therefore, the study is nondiagnostic for GI bleed. VASCULAR Aorta: Normal caliber.  Scattered calcifications. Celiac: Patent SMA: Patent Renals:  Patent IMA: Patent Inflow: Calcifications in the iliac vessels. No aneurysm or dissection. Proximal Outflow: Grossly unremarkable Veins: No obvious venous abnormality within the limitations of this  study. IVC filter in the infrarenal IVC. Review of the MIP images confirms the above findings. NON-VASCULAR Lower chest: No acute abnormality. Hepatobiliary: No focal hepatic abnormality. Gallbladder unremarkable. Pancreas: No focal abnormality or ductal dilatation. Spleen: No focal abnormality.  Normal size. Adrenals/Urinary Tract: No adrenal abnormality. No focal renal abnormality. No stones or hydronephrosis. Urinary bladder is unremarkable. Stomach/Bowel: Scattered colonic diverticula. No active extravasation seen within the transverse colon, hepatic flexure or splenic flexure. Cannot assess for contrast extravasation in the lower abdomen and pelvis. Lymphatic: No adenopathy Reproductive: Uterus and adnexa unremarkable.  No mass. Other: There is a midline pigtail drainage catheter in place in the pelvis just superior to the urinary bladder. No surrounding fluid collection. No free fluid or free air. Musculoskeletal: No acute bony abnormality. IMPRESSION: VASCULAR Nondiagnostic study for GI bleed due to injector and scanner malfunction. This could be repeated in 24-48 hours if symptoms persist and felt clinically indicated. No evidence of aortic aneurysm or dissection. Scattered aortic and iliac calcifications. NON-VASCULAR Scattered colonic diverticulosis. Electronically Signed   By: Charlett Nose M.[name].   On: 06/14/2021 00:39    Medications: Scheduled:  sodium chloride   Intravenous Once   Chlorhexidine Gluconate Cloth  6 each Topical Daily   feeding supplement  237 mL Oral BID BM   insulin aspart  0-9 Units Subcutaneous Q4H   levothyroxine  112 mcg Oral Q0600   methylPREDNISolone (SOLU-MEDROL) injection  8 mg Intravenous Daily   mirtazapine  7.5 mg Oral QHS   potassium chloride  40 mEq Oral Daily   saccharomyces boulardii  250 mg Oral BID   simethicone  20 mg Oral QID   sodium chloride flush  10-40 mL Intracatheter Q12H   sodium chloride flush  5 mL Intracatheter Q8H   Continuous:  pantoprazole 8  mg/hr (06/15/21 0417)   piperacillin-tazobactam (ZOSYN)  IV 3.375 g (06/15/21 0416)   potassium chloride 10 mEq (06/15/21 1129)    Assessment/Plan: 1) GI bleed - ? Source. 2) Anemia - stable. 3) Diverticular abscess.   The patient is stable.  A mixture of melenic and reddish liquid stool ws found in the stool bag.  Nursing confirmed that the small amount was over the past 12 hours.  Prior to this shift the stool bag contained 400 ml of liquid stool.  Her HGB is stable.  No issues with hemodynamic instability.  The EGD showed a significant amount of hematin, but no obviously bleeding site(s) to explain the amount of bleeding the other night.  Plan: 1) Continue with PPI. 2) Follow HGB and transfuse as necessary. 3) If she continue with melenic stools and there is a drop in her HGB, a bleeding scan is recommended. 4) If she has hematochezia, a repeat CTA is recommended. 5) Dr. Loreta Ave will be covering tomorrow and [facility] will cover over the weekend.  LOS: 20 days   [name] 06/15/2021, 12:22 PM

## 2021-06-15 NOTE — Evaluation (Signed)
Speech Language Pathology Evaluation Patient Details Name: [name] MRN: [id number] DOB: 05-09-1951 Today's Date: 06/15/2021 Time: 1105-1150 SLP Time Calculation (min) (ACUTE ONLY): 45 min  Problem List:  Patient Active Problem List   Diagnosis Date Noted   Colonic diverticular abscess 05/26/2021   Bilateral pulmonary embolism (HCC) 05/26/2021   Cardiac arrhythmia 05/23/2021   Chronic kidney disease due to hypertension 05/23/2021   Chronic kidney disease, stage 3a (HCC) 05/23/2021   Diabetic renal disease (HCC) 05/23/2021   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   Gastroesophageal reflux disease 05/23/2021   Hyperlipidemia 05/23/2021   Moderate persistent asthma, uncomplicated 05/23/2021   Stroke (HCC) 05/23/2021   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 05/23/2021   Morbid obesity (HCC) 11/07/2020   Optic neuropathy, compressive 08/24/2020   Thyroid eye disease 08/04/2020   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 (HCC) 04/09/2020   Hypertensive retinopathy of both eyes 04/09/2020   Anemia 08/07/2017   Celiac disease 08/07/2017   Graves' disease with exophthalmos 08/07/2017   History of depression 08/07/2017   Diastolic dysfunction 05/25/2013   Headache(784.0) 05/18/2013   Chronic diastolic CHF (congestive heart failure) (HCC) 12/04/2011   Hemorrhoids, internal, with bleeding 11/28/2011   Seizure (HCC) 06/30/2010   Past Medical History:  Past Medical History:  Diagnosis Date   Anemia    Celiac disease    CHF (congestive heart failure) (HCC)    Depression    Diabetes mellitus without complication (HCC)    Edema    lower extremities   Emphysema of lung (HCC)    Enlarged heart    begining stage   Graves disease    Headache(784.0) 05/18/2013   Heart murmur    Hematochezia    Hemorrhoid    Hyperlipidemia    Hypertension    Migraine    Obese    OSA (obstructive sleep apnea)    Seizures (HCC)    Sleep apnea    Stroke  (HCC)    left P-O ICH 2007   Thyroid disease    hypothyroidism   Ulcer    Vitamin D deficiency    Past Surgical History:  Past Surgical History:  Procedure Laterality Date   CESAREAN SECTION     ESOPHAGOGASTRODUODENOSCOPY (EGD) WITH PROPOFOL N/A 06/01/2021   Procedure: ESOPHAGOGASTRODUODENOSCOPY (EGD) WITH PROPOFOL;  Surgeon: Jeani Hawking, MD;  Location: [facility] ENDOSCOPY;  Service: Endoscopy;  Laterality: N/A;   HEMORRHOID SURGERY     IR IVC FILTER PLMT / S&I /IMG GUID/MOD SED  06/01/2021   IR SINUS/FIST TUBE CHK-NON GI  06/14/2021   IR US GUIDE VASC ACCESS RIGHT  06/01/2021   IUD REMOVAL     TONSILLECTOMY     VEIN SURGERY     HPI:  70 yo female adm to [facility] with abdominal pain - found to have diverticular perforation with abscess and is on zosyn.  PMH + for HTN, OSA, Graves disease, HF, seizure.  Imaginf of brain showed Chronic left parietal lobe hemorrhagic infarct after onset of seizure and right sided weakness in 2011.  Cognitive evaluation ordered.  Pt lives alone and manages her home duties per her statement.  She had stayed with her daughter after eye surgery last year and [name] noted pt having some cognitive linguistic difficulties where she would provide excess information - at times off topic - when asked a  yes/no question.  She states she helps pt compensate by asking simple questions.  Pt admits to increased cognitive diffiuclties with this event.   Assessment /  Plan / Recommendation Clinical Impression  Cognitive screen *SLUMS* conducted with pt scoring 15/30 indicative of severe cognitive deficits (1-20 indicative of severe deficits)per authors of screen.  Strengths included orientation, free word generation and pragmatics.  Difficulties noted with functional math problem solving, clock drawing, working memory for number manipulation.  IN addition, at times pt does not answer SLP questions - appears to talk around answer- and off topic at times.  She often provides more details than  necessary. Suspect this is compensatory/covering for her.  Recommend pt have follow up SLP HH to establish compensation strategies for help mitigate cognitive linguistic defiits.  Daughter and pt educated to potential compensations *eg daily pill container with timer/program to ring on cell and open when time to take medications.  Advised to defer setting up home compensation until pt in environment to understand pt's routine and determine most helpful strategies.  Recommend pt intially have full supervision until it is determined that she can manage home duties.  Thanks for this consult.  Of note, following testing, noted pt had a prior Chronic left parietal lobe hemorrhagic infarct seen on imaginig 2011 after pt had right sided weakness/seizure.  Suspect this prior CVA may be contributing to her symptoms.    SLP Assessment  SLP Recommendation/Assessment: All further Speech Lanaguage Pathology  needs can be addressed in the next venue of care SLP Visit Diagnosis: Cognitive communication deficit (R41.841)    Follow Up Recommendations  Home health SLP;Other (comment) (HH SlP when transitions to home environment)    Frequency and Duration           SLP Evaluation Cognition  Overall Cognitive Status: Impaired/Different from baseline Arousal/Alertness: Awake/alert Orientation Level: Oriented to person;Oriented to place;Oriented to time;Oriented to situation Attention: Sustained Sustained Attention: Impaired Sustained Attention Impairment: Verbal complex Memory: Impaired (recalled 3/5 words without cues) Awareness: Appears intact Problem Solving: Appears intact Safety/Judgment: Impaired       Comprehension  Auditory Comprehension Overall Auditory Comprehension: Appears within functional limits for tasks assessed Yes/No Questions: Not tested Commands: Within Functional Limits Conversation: Simple Visual Recognition/Discrimination Discrimination: Not tested Reading Comprehension Reading  Status: Not tested    Expression Expression Primary Mode of Expression: Verbal Verbal Expression Overall Verbal Expression: Impaired Initiation: Impaired Repetition: No impairment Naming: Not tested Pragmatics: No impairment Written Expression Dominant Hand: Right Written Expression:  (difficulty with clock drawing - putting numbers in correctly and correct time placement)   Oral / Motor  Oral Motor/Sensory Function Overall Oral Motor/Sensory Function: Other (comment) Motor Speech Overall Motor Speech: Appears within functional limits for tasks assessed Respiration: Within functional limits Resonance: Within functional limits Articulation: Within functional limitis Intelligibility: Intelligible Motor Planning: Not tested   GO                    Chales Abrahams 06/15/2021, 12:31 PM  Rolena Infante, MS [facility] SLP Acute Rehab Services Office [phone] Pager [phone]

## 2021-06-16 LAB — COMPREHENSIVE METABOLIC PANEL
ALT: 18 U/L (ref 0–44)
AST: 18 U/L (ref 15–41)
Albumin: 2 g/dL — ABNORMAL LOW (ref 3.5–5.0)
Alkaline Phosphatase: 46 U/L (ref 38–126)
Anion gap: 6 (ref 5–15)
BUN: 6 mg/dL — ABNORMAL LOW (ref 8–23)
CO2: 20 mmol/L — ABNORMAL LOW (ref 22–32)
Calcium: 8.3 mg/dL — ABNORMAL LOW (ref 8.9–10.3)
Chloride: 111 mmol/L (ref 98–111)
Creatinine, Ser: 0.84 mg/dL (ref 0.44–1.00)
GFR, Estimated: >60 mL/min (ref >60)
Glucose, Bld: 99 mg/dL (ref 70–99)
Potassium: 4.4 mmol/L (ref 3.5–5.1)
Sodium: 137 mmol/L (ref 135–145)
Total Bilirubin: 0.7 mg/dL (ref 0.3–1.2)
Total Protein: 4 g/dL — ABNORMAL LOW (ref 6.5–8.1)

## 2021-06-16 LAB — CBC WITH DIFFERENTIAL/PLATELET
Abs Immature Granulocytes: 0.06 10*3/uL (ref 0.00–0.07)
Basophils Absolute: 0.1 10*3/uL (ref 0.0–0.1)
Basophils Relative: 1 %
Eosinophils Absolute: 0.1 10*3/uL (ref 0.0–0.5)
Eosinophils Relative: 1 %
HCT: 25.6 % — ABNORMAL LOW (ref 36.0–46.0)
Hemoglobin: 8.5 g/dL — ABNORMAL LOW (ref 12.0–15.0)
Immature Granulocytes: 1 %
Lymphocytes Relative: 18 %
Lymphs Abs: 1.8 10*3/uL (ref 0.7–4.0)
MCH: 32 pg (ref 26.0–34.0)
MCHC: 33.2 g/dL (ref 30.0–36.0)
MCV: 96.2 fL (ref 80.0–100.0)
Monocytes Absolute: 0.8 10*3/uL (ref 0.1–1.0)
Monocytes Relative: 8 %
Neutro Abs: 7.5 10*3/uL (ref 1.7–7.7)
Neutrophils Relative %: 71 %
Platelets: 143 10*3/uL — ABNORMAL LOW (ref 150–400)
RBC: 2.66 MIL/uL — ABNORMAL LOW (ref 3.87–5.11)
RDW: 18.6 % — ABNORMAL HIGH (ref 11.5–15.5)
WBC: 10.3 10*3/uL (ref 4.0–10.5)
nRBC: 0.4 % — ABNORMAL HIGH (ref 0.0–0.2)

## 2021-06-16 LAB — GLUCOSE, CAPILLARY
Glucose-Capillary: 141 mg/dL — ABNORMAL HIGH (ref 70–99)
Glucose-Capillary: 91 mg/dL (ref 70–99)
Glucose-Capillary: 74 mg/dL (ref 70–99)
Glucose-Capillary: 111 mg/dL — ABNORMAL HIGH (ref 70–99)
Glucose-Capillary: 208 mg/dL — ABNORMAL HIGH (ref 70–99)
Glucose-Capillary: 129 mg/dL — ABNORMAL HIGH (ref 70–99)
Glucose-Capillary: 131 mg/dL — ABNORMAL HIGH (ref 70–99)

## 2021-06-16 MED ORDER — DEXTROSE 50 % IV SOLN
12.5000 g | INTRAVENOUS | Status: AC
  Administered 2021-06-16: 12.5 g via INTRAVENOUS

## 2021-06-16 MED ORDER — DEXTROSE 50 % IV SOLN
INTRAVENOUS | Status: AC
  Filled 2021-06-16: qty 50

## 2021-06-16 NOTE — Progress Notes (Signed)
Progress Note  2 Days Post-Op  Subjective: In good spirits without complaints. She has had fulls without any further nausea or emesis. No abdominal pain.   Objective: Vital signs in last 24 hours: Temp:  [97.8 F (36.6 C)-98.4 F (36.9 C)] 97.8 F (36.6 C) (07/01 1118) Pulse Rate:  [81-97] 84 (07/01 1118) Resp:  [13-23] 15 (07/01 1118) BP: (106-129)/(75-85) 118/85 (07/01 1118) SpO2:  [92 %-99 %] 96 % (07/01 1118) Last BM Date: (P) 06/15/21  Intake/Output from previous day: 06/30 0701 - 07/01 0700 In: 1069.6 [P.O.:900; I.V.:159.6] Out: 530 [Urine:500; Drains:30] Intake/Output this shift: Total I/O In: 400 [P.O.:400] Out: 1400 [Urine:1400]  PE: General: tearful, WD, female who is sitting up in chair in NAD HEENT: head is normocephalic, atraumatic. Mouth is pink and moist Heart:  Palpable radial pulses bilaterally Lungs: Respiratory effort nonlabored Abd: soft, NT, ND, +BS, drain with scant serosanguinous fluid MS: all 4 extremities are symmetrical with no cyanosis, clubbing, or edema. Skin: warm and dry with no masses, lesions, or rashes Psych: A&Ox3 with an appropriate affect.   Lab Results:  Recent Labs    06/15/21 1810 06/16/21 0253  WBC 12.6* 10.3  HGB 9.1* 8.5*  HCT 27.6* 25.6*  PLT 169 143*    BMET Recent Labs    06/15/21 0500 06/16/21 0253  NA 138 137  K 3.2* 4.4  CL 109 111  CO2 23 20*  GLUCOSE 91 99  BUN 11 6*  CREATININE 0.76 0.84  CALCIUM 7.9* 8.3*    PT/INR No results for input(s): LABPROT, INR in the last 72 hours. CMP     Component Value Date/Time   NA 137 06/16/2021 0253   NA 141 11/07/2020 1058   K 4.4 06/16/2021 0253   CL 111 06/16/2021 0253   CO2 20 (L) 06/16/2021 0253   GLUCOSE 99 06/16/2021 0253   BUN 6 (L) 06/16/2021 0253   BUN 13 11/07/2020 1058   CREATININE 0.84 06/16/2021 0253   CALCIUM 8.3 (L) 06/16/2021 0253   PROT 4.0 (L) 06/16/2021 0253   PROT 6.5 11/07/2020 1058   ALBUMIN 2.0 (L) 06/16/2021 0253    ALBUMIN 4.3 11/07/2020 1058   AST 18 06/16/2021 0253   ALT 18 06/16/2021 0253   ALKPHOS 46 06/16/2021 0253   BILITOT 0.7 06/16/2021 0253   BILITOT 0.6 11/07/2020 1058   GFRNONAA >60 06/16/2021 0253   GFRAA 61 11/07/2020 1058   Lipase     Component Value Date/Time   LIPASE 19 05/26/2021 1601       Studies/Results: No results found.  Anti-infectives: Anti-infectives (From admission, onward)    Start     Dose/Rate Route Frequency Ordered Stop   05/27/21 0500  piperacillin-tazobactam (ZOSYN) IVPB 3.375 g        3.375 g 12.5 mL/hr over 240 Minutes Intravenous Every 8 hours 05/26/21 2126     05/26/21 2015  ceFEPIme (MAXIPIME) 2 g in sodium chloride 0.9 % 100 mL IVPB       See Hyperspace for full Linked Orders Report.   2 g 200 mL/hr over 30 Minutes Intravenous  Once 05/26/21 2004 05/26/21 2107   05/26/21 2015  metroNIDAZOLE (FLAGYL) IVPB 500 mg       See Hyperspace for full Linked Orders Report.   500 mg 100 mL/hr over 60 Minutes Intravenous  Once 05/26/21 2004 05/26/21 2135        Assessment/Plan  76F Diverticulitis with intra-abdominal abscess (9x5 cm) - afebrile, WBC 10.3, is on chronic steroids,  monitor -  culture positive for pseudomonas sensitive to zosyn - Repeat CT 6/27 with abscess nearly completely decompressed. - IR drain injection 6/29 - no fistula but cavity remains. Continue drain. Output 30 mL - No emergent surgical needs at this juncture - ID involved managing abx - hoping to switch to oral soon pending emesis - OOB/mobilize with therapies  - tolerating fulls, advance to soft  Hematemesis and bloody stool - CT 6/29 nondiagnostic for GI bleed due to malfunction - hgb 8.5 s/p 2 units 6/29 - EGD 6/29 without source of acute bleeding. Repeat CT abd if further bleeding episodes - transfuse as needed for Hgb <7   FEN: soft, IVF ID: Zosyn 6/10>> VTE: SCD's Foley: none, external cath Dispo: SDU   - Below per primary service - Bilateral PE-heparin IV  held due to hgb requiring transfusion. S/p IVCF 6/16 by IR Melena - EGD 6/16 normal esophagus, erythematous mucosa in the antrum, duodenitis but no active bleeding. GI signed off. Acute respiratory failure with hypoxia Thombocytopenia Chronic diastolic heart failure CKD IIIa HTN Graves dz DM2 Morbid obesity    LOS: 21 days    Eric Form, [facility] 06/16/2021, 12:04 PM Please see Amion for pager number during day hours 7:00am-4:30pm

## 2021-06-16 NOTE — Progress Notes (Signed)
Physical Therapy Treatment Patient Details Name: [name] MRN: [id number] DOB: February 27, 1951 Today's Date: 06/16/2021    History of Present Illness 70 y.o. female admitted on 05/26/21 for sigmoid diverticulitis with perforation and abscess, IR consulted for surgery vs drain placement and had an unsucessful attempt on 05/28/21.  Bil PE started on IV heparin. S/p CT guided placement of a 10 Fr drainage catheter placement into the diverticular abscess within the midline of the lower abdomen/upper pelvis 6/21. S/p pelvic abscessogram 6/29. PMH including anemia, CHF, DM, graves disease, HTN, obesity, stroke, seizure, and thyroid disease.    PT Comments    Intended to progress gait distance this session, but HR quickly increased to 140 bpm with static standing. Pt reports walking earlier today, thus focused session on bed mobility and transfer training alone with lower extremity exercises to facilitate improved functional mobility independence and safety. Pt making good progress with transfers, needing min guard-minA. Pt with decreased hip flexor strength in bil legs, resulting in her needing assistance to lift her legs onto the bed to return to supine or to pull her legs into flexion to prepare to bridge to scoot superiorly in bed. Will continue to follow acutely. Insurance denied CIR, thus changed d/c recs to SNF.   Follow Up Recommendations  SNF     Equipment Recommendations  Rolling walker with 5" wheels;Wheelchair (measurements PT);Wheelchair cushion (measurements PT);Hospital bed    Recommendations for Other Services       Precautions / Restrictions Precautions Precautions: Fall Precaution Comments: flexiseal, R gravity drain Restrictions Weight Bearing Restrictions: No    Mobility  Bed Mobility Overal bed mobility: Needs Assistance Bed Mobility: Supine to Sit;Sit to Supine;Rolling Rolling: Min assist   Supine to sit: HOB elevated;Min guard Sit to supine: Min assist    General bed mobility comments: Pt rolls to L with minA to initiate bringing R leg across body. Min guard with extra time and management of lines to sit up EOB with HOB elevated. MinA to manage legs back into bed with return to supine, poor initiation by pt. MinA to scoot superiorly in bed, holding feet and knees in place and cuing pt to pull up on [facility] rail.    Transfers Overall transfer level: Needs assistance Equipment used: Rolling walker (2 wheeled) Transfers: Sit to/from Stand Sit to Stand: Min guard;Min assist         General transfer comment: Cues for hand placement, use of momentum to power up into standing, up to minA to achieve upright. Needs several attempts before successful the first transfer rep. Cues to reach back for bed prior to sitting to control descent. sit ot stand from low EOB 6x.  Ambulation/Gait             General Gait Details: Deferred as pt's HR increased to 140 with static standing and pt reporting walking earlier with OT.   Stairs             Wheelchair Mobility    Modified Rankin (Stroke Patients Only)       Balance Overall balance assessment: Needs assistance Sitting-balance support: No upper extremity supported;Feet supported Sitting balance-Leahy Scale: Fair     Standing balance support: Bilateral upper extremity supported;During functional activity Standing balance-Leahy Scale: Poor Standing balance comment: reliant on support in standing.                            Cognition Arousal/Alertness: Awake/alert Behavior During Therapy: [facility]  for tasks assessed/performed Overall Cognitive Status: Impaired/Different from baseline Area of Impairment: Memory;Safety/judgement;Problem solving;Attention                   Current Attention Level: Sustained Memory: Decreased short-term memory   Safety/Judgement: Decreased awareness of deficits Awareness: Emergent Problem Solving: Difficulty sequencing;Requires verbal  cues;Slow processing General Comments: Pt did not remember she had a line in her R UE. Pt with good recall of hand placement for transfers from prior sessions this date.      Exercises General Exercises - Lower Extremity Heel Slides: Both;10 reps;Supine;AAROM Straight Leg Raises: AAROM;Both;10 reps;Supine Hip Flexion/Marching: Both;10 reps;Standing (with RW) Other Exercises Other Exercises: Bridges in bed while supine, 10x with assistance to keep feet and knees in place Other Exercises: sit <> stand 6x from EOB Other Exercises: pursed lip breathing with decr in HR    General Comments General comments (skin integrity, edema, etc.): HR up to 140 with static standing, increased labored breathing noted, educated for pursed lip breathing; quick rebound with sitting rest break with HR returning to 90-110s; HR up to 141 with exercises      Pertinent Vitals/Pain Pain Assessment: Faces Faces Pain Scale: No hurt Pain Intervention(s): Monitored during session    Home Living                      Prior Function            PT Goals (current goals can now be found in the care plan section) Acute Rehab PT Goals Patient Stated Goal: to improve PT Goal Formulation: With patient Time For Goal Achievement: 06/27/21 Potential to Achieve Goals: Good Progress towards PT goals: Progressing toward goals    Frequency    Min 3X/week      PT Plan Discharge plan needs to be updated    Co-evaluation              AM-PAC PT "6 Clicks" Mobility   Outcome Measure  Help needed turning from your back to your side while in a flat bed without using bedrails?: A Little Help needed moving from lying on your back to sitting on the side of a flat bed without using bedrails?: A Little Help needed moving to and from a bed to a chair (including a wheelchair)?: A Little Help needed standing up from a chair using your arms (e.g., wheelchair or bedside chair)?: A Little Help needed to walk in  hospital room?: A Little Help needed climbing 3-5 steps with a railing? : Total 6 Click Score: 16    End of Session Equipment Utilized During Treatment: Gait belt Activity Tolerance: Patient tolerated treatment well Patient left: with call bell/phone within reach;in bed;with bed alarm set Nurse Communication: Mobility status;Other (comment) (HR) PT Visit Diagnosis: Muscle weakness (generalized) (M62.81);Difficulty in walking, not elsewhere classified (R26.2);Unsteadiness on feet (R26.81);Other abnormalities of gait and mobility (R26.89)     Time: 1630-1701 PT Time Calculation (min) (ACUTE ONLY): 31 min  Charges:  $Therapeutic Exercise: 8-22 mins $Therapeutic Activity: 8-22 mins                     Raymond Gurney, PT, DPT Acute Rehabilitation Services  Pager: [phone] Office: [phone]    Jewel Baize 06/16/2021, 5:17 PM

## 2021-06-16 NOTE — Progress Notes (Signed)
Occupational Therapy Treatment Patient Details Name: [name] MRN: [id number] DOB: Aug 28, 1951 Today's Date: 06/16/2021    History of present illness 70 y.o. female admitted on 05/26/21 for sigmoid diverticulitis with perforation and abscess, IR consulted for surgery vs drain placement and had an unsucessful attempt on 05/28/21.  Bil PE started on IV heparin. S/p CT guided placement of a 10 Fr drainage catheter placement into the diverticular abscess within the midline of the lower abdomen/upper pelvis 6/21. S/p pelvic abscessogram 6/29. PMH including anemia, CHF, DM, graves disease, HTN, obesity, stroke, seizure, and thyroid disease.   OT comments  Pt progressed with 1 person (A) oob RW to window and back with elevated HR 152 min (A). Pt requires rest break and pursed lip breathing. Pt able to complete x4 sit<>Stand this session with delay to rebound in HR. Recommendation CIR to reach MOD I  Follow Up Recommendations  CIR    Equipment Recommendations  Other (comment) (RW)    Recommendations for Other Services      Precautions / Restrictions Precautions Precautions: Fall Precaution Comments: flexiseal; R drain       Mobility Bed Mobility Overal bed mobility: Needs Assistance Bed Mobility: Supine to Sit Rolling: Min guard Sidelying to sit: Min guard       General bed mobility comments: requires use of bed rails but able to progress    Transfers Overall transfer level: Needs assistance Equipment used: Rolling walker (2 wheeled) Transfers: Sit to/from Stand Sit to Stand: Min assist         General transfer comment: pt with rocking momentum to achieve standing and cues for hand placement. Pt able to stand with one person (A)    Balance                                           ADL either performed or assessed with clinical judgement   ADL Overall ADL's : Needs assistance/impaired Eating/Feeding: Set up;Sitting   Grooming: Wash/dry  hands;Set up;Sitting                   Toilet Transfer: Minimal assistance;RW Toilet Transfer Details (indicate cue type and reason): due to lines and leads         Functional mobility during ADLs: Minimal assistance;Rolling walker General ADL Comments: Pt needs cues during session for lines. pt using purewick due to urgency to EOB instead of bathroom. pt educated with daughter on requesting oob to bathroom every two hours for bladder training     Vision       Perception     Praxis      Cognition Arousal/Alertness: Awake/alert Behavior During Therapy: WFL for tasks assessed/performed Overall Cognitive Status: Impaired/Different from baseline Area of Impairment: Awareness;Memory                 Orientation Level: Disoriented to;Time Current Attention Level: Selective Memory: Decreased short-term memory   Safety/Judgement: Decreased awareness of deficits Awareness: Emergent   General Comments: Pt unable to recall lunch order and then after OT cues "could be soup" pt able to settle on a response after naming 2 options. Pt given education on the course of the session and even after finished pt states wow I cant believe i already did it.        Exercises Other Exercises Other Exercises: pursed lip breathing with decr in HR   Shoulder  Instructions       General Comments HR 152 max with movement. Pt reports no pain and decr awareness to elevating HR    Pertinent Vitals/ Pain       Pain Assessment: No/denies pain  Home Living                                          Prior Functioning/Environment              Frequency  Min 2X/week        Progress Toward Goals  OT Goals(current goals can now be found in the care plan section)  Progress towards OT goals: Progressing toward goals  Acute Rehab OT Goals Patient Stated Goal: to keep pressure off her bottom OT Goal Formulation: With patient Time For Goal Achievement:  06/30/21 Potential to Achieve Goals: Good ADL Goals Pt Will Perform Grooming: with modified independence Pt Will Perform Upper Body Bathing: with modified independence Pt Will Transfer to Toilet: with modified independence Additional ADL Goal #1: Pt will perform bed mobility with Supervision in preparation for ADLs Additional ADL Goal #2: met  Plan Discharge plan remains appropriate    Co-evaluation                 AM-PAC OT "6 Clicks" Daily Activity     Outcome Measure   Help from another person eating meals?: A Little Help from another person taking care of personal grooming?: A Little Help from another person toileting, which includes using toliet, bedpan, or urinal?: A Little Help from another person bathing (including washing, rinsing, drying)?: A Little Help from another person to put on and taking off regular upper body clothing?: A Little Help from another person to put on and taking off regular lower body clothing?: A Lot 6 Click Score: 17    End of Session Equipment Utilized During Treatment: Rolling walker  OT Visit Diagnosis: Unsteadiness on feet (R26.81);Other abnormalities of gait and mobility (R26.89);Muscle weakness (generalized) (M62.81);Pain   Activity Tolerance Patient tolerated treatment well   Patient Left in chair;with call bell/phone within reach;with chair alarm set;with family/visitor present   Nurse Communication Mobility status;Precautions        Time: 4114-6431 OT Time Calculation (min): 19 min  Charges: OT General Charges $OT Visit: 1 Visit OT Treatments $Self Care/Home Management : 8-22 mins   Brynn, OTR/L  Acute Rehabilitation Services Pager: [phone] Office: [phone] .    Jeri Modena 06/16/2021, 1:04 PM

## 2021-06-16 NOTE — Progress Notes (Signed)
Inpatient Rehab Admissions Coordinator:   Received word from [employer] that expedited appeal is complete and denial is upheld.  They will not approve CIR.  I will not be able to admit Ms. Clapham.  I did call her daughters to let them know.  I will sign off at this time.   Estill Dooms, PT, DPT Admissions Coordinator [phone] 06/16/21  1:11 PM

## 2021-06-16 NOTE — Progress Notes (Signed)
[facility] Progress Note  Patient: [name]    DTO:[id number]  DOA: 05/26/2021     Date of Service: the patient was seen and examined on 06/16/2021  Brief hospital course: Past medical history of HFpEF, HTN, OSA, T2DM, obesity, Graves' disease on prednisone taper.  Presents with complaints of abdominal pain. Found to have perforated diverticulitis and abscess.  Had intermittent melena as well. EGD on 6/16 and 6/29.  No significant evidence of bleeding seen. Currently plan is monitor H&H toleration of oral diet.  Subjective: No acute complaint.  No nausea no vomiting.  Stool greenish color.  No abdominal pain.  No shortness of breath.  Assessment and Plan: 1.  Acute blood loss anemia Upper GI bleed Hemoglobin baseline around 12. EGD 6/16 normal esophagus duodenitis. EGD 6/29 normal esophagus erythematous duodenopathy as well as enteropathy. Hematin in gastric body. Not a candidate for colonoscopy due to diverticulitis. Not a candidate for capsule endoscopy given per GI as well. Per GI recommended continue IV PPI. If the patient has significant hematochezia CT angiogram should be performed. If the patient continues to have melena with slow drop in hemoglobin nuclear medicine RBC scan should be performed. Transfuse for hemoglobin less than 7 or hemodynamic instability. advancing to soft diet.  2.  Perforated diverticulitis with an abscess. Initial attempt for drain placement were not successful. Repeat CT scan on 6/18 shows increasing abscess size. On 6/21 IR placed a drain. Cultures growing pansensitive Pseudomonas.  ID consulted. Currently plan is to continue IV Zosyn for now.  Transition to p.o. Levaquin when the patient is able to tolerate p.o. adequately 750 mg daily. Continue for 10 days of antibiotic after removal of the IR drain. No indication for ID follow-up. Continue pain control.   3.  Acute hypoxic respiratory failure, present on admission secondary to  bilateral PE with RV strain. Acute thrombocytopenia-secondary to consumption in clot, resolved PE seen on initial CT scan on admission. Patient was started on IV heparin. PCCM was notified as the patient had RV strain on echocardiogram.  Recommended to continue anticoagulation and outpatient follow-up with pulmonology Dr. Judeth Horn. Due to GI bleed anticoagulation was discontinued. IVC filter placed on 6/16 DVT prophylaxis was initiated after stabilization of GI bleed with Lovenox on 6/23.   Discontinue on 6/28 again due to GI bleed Oxygenation currently stable on room air. Not a candidate anticoagulation due to ongoing acute GI bleed.   4.  Type 2 diabetes mellitus, uncontrolled with hyper and hypoglycemia without any complication Continue sliding scale insulin. Was hypoglycemic last night.   5.  Hypothyroidism, Graves' disease, exophthalmos. On steroids at home as well as levothyroxine. Continue levothyroxine. Per endocrine patient was started on prednisone taper, in the hospital patient is on Solu-Medrol.  Will continue current dose.   6.  HFpEF No evidence of volume overload. Monitor.   7.  CKD 3A. Renal function stable. Monitor.   8.  Morbid obesity. Body mass index is 42.82 kg/m.  Placing the patient at high risk poor outcome. May have undiagnosed sleep apnea as well.  Will benefit from diagnostic evaluation outpatient.   10.  Suspected depression.  Poor p.o. intake/anorexia Continue Remeron.  Tolerating well.   11.  Hypotensive episode.  6/29 resolved Before EGD blood pressure dropped significantly to 70s. Patient was: 1 L normal saline bolus as well as 1 PRBC transfusion. Blood pressure improved after this.  Scheduled Meds:  sodium chloride   Intravenous Once   Chlorhexidine Gluconate Cloth  6  each Topical Daily   feeding supplement  237 mL Oral BID BM   insulin aspart  0-9 Units Subcutaneous Q4H   levothyroxine  112 mcg Oral Q0600   methylPREDNISolone  (SOLU-MEDROL) injection  8 mg Intravenous Daily   mirtazapine  7.5 mg Oral QHS   saccharomyces boulardii  250 mg Oral BID   simethicone  20 mg Oral QID   sodium chloride flush  10-40 mL Intracatheter Q12H   sodium chloride flush  5 mL Intracatheter Q8H   Continuous Infusions:  piperacillin-tazobactam (ZOSYN)  IV 3.375 g (06/16/21 1401)   PRN Meds: Gerhardt's butt cream, liver oil-zinc oxide, morphine injection, ondansetron (ZOFRAN) IV, sodium chloride flush  Body mass index is 42.82 kg/m.  Nutrition Problem: Inadequate oral intake Etiology: altered GI function, acute illness     DVT Prophylaxis: SCD, pharmacological prophylaxis contraindicated due to GI bleed     Advance goals of care discussion: Pt is Full code.  Family Communication: family was present at bedside, at the time of interview.  The pt provided permission to discuss medical plan with the family. Opportunity was given to ask question and all questions were answered satisfactorily.   Data Reviewed: I have personally reviewed and interpreted daily labs, tele strips, imaging. Potassium improved.  4.4. Serum creatinine stable. Leukocytosis improved. Hemoglobin remained stable 8.5-8.4-9.1-8.5.  Physical Exam:  General: Appear in mild distress, no Rash; Oral Mucosa Clear, moist. no Abnormal Neck Mass Or lumps, Conjunctiva normal  Cardiovascular: S1 and S2 Present, no Murmur, Respiratory: good respiratory effort, Bilateral Air entry present and CTA, no Crackles, no wheezes Abdomen: Bowel Sound present, Soft and no tenderness Extremities: no Pedal edema Neurology: alert and oriented to time, place, and person affect appropriate. no new focal deficit Gait not checked due to patient safety concerns  Vitals:   06/16/21 0405 06/16/21 0741 06/16/21 1118 06/16/21 1536  BP: 129/81 124/83 118/85 (!) 93/59  Pulse: 81 86 84 100  Resp: 19 19 15 17   Temp: 98 F (36.7 C) 98.2 F (36.8 C) 97.8 F (36.6 C) 98 F (36.7 C)   TempSrc: Oral Oral Oral Oral  SpO2: 94% 98% 96% 97%  Weight:      Height:        Disposition:  Status is: Inpatient  Remains inpatient appropriate because:Inpatient level of care appropriate due to severity of illness  Dispo: The patient is from: Home              Anticipated d/c is to: SNF              Patient currently is not medically stable to d/c.   Difficult to place patient No if hemoglobin remained stable over the next 24 hours and tolerating oral diet, can go to CIR  Time spent: 35 minutes. I reviewed all nursing notes, pharmacy notes, vitals, pertinent old records. I have discussed plan of care as described above with RN.  Author: , MD [facility] 06/16/2021 6:14 PM  To reach On-call, see care teams to locate the attending and reach out via [email or web address]. Between 7PM-7AM, please contact night-coverage If you still have difficulty reaching the attending provider, please page the [facility] (Director on Call) for [facility] on amion for assistance.

## 2021-06-17 LAB — CBC
HCT: 26.8 % — ABNORMAL LOW (ref 36.0–46.0)
Hemoglobin: 8.6 g/dL — ABNORMAL LOW (ref 12.0–15.0)
MCH: 31.7 pg (ref 26.0–34.0)
MCHC: 32.1 g/dL (ref 30.0–36.0)
MCV: 98.9 fL (ref 80.0–100.0)
Platelets: 161 10*3/uL (ref 150–400)
RBC: 2.71 MIL/uL — ABNORMAL LOW (ref 3.87–5.11)
RDW: 19.7 % — ABNORMAL HIGH (ref 11.5–15.5)
WBC: 9.1 10*3/uL (ref 4.0–10.5)
nRBC: 0 % (ref 0.0–0.2)

## 2021-06-17 LAB — GLUCOSE, CAPILLARY
Glucose-Capillary: 99 mg/dL (ref 70–99)
Glucose-Capillary: 104 mg/dL — ABNORMAL HIGH (ref 70–99)
Glucose-Capillary: 214 mg/dL — ABNORMAL HIGH (ref 70–99)
Glucose-Capillary: 142 mg/dL — ABNORMAL HIGH (ref 70–99)
Glucose-Capillary: 150 mg/dL — ABNORMAL HIGH (ref 70–99)

## 2021-06-17 LAB — BASIC METABOLIC PANEL
Anion gap: 8 (ref 5–15)
BUN: <5 mg/dL — ABNORMAL LOW (ref 8–23)
CO2: 22 mmol/L (ref 22–32)
Calcium: 8.1 mg/dL — ABNORMAL LOW (ref 8.9–10.3)
Chloride: 109 mmol/L (ref 98–111)
Creatinine, Ser: 0.73 mg/dL (ref 0.44–1.00)
GFR, Estimated: >60 mL/min (ref >60)
Glucose, Bld: 101 mg/dL — ABNORMAL HIGH (ref 70–99)
Potassium: 3.9 mmol/L (ref 3.5–5.1)
Sodium: 139 mmol/L (ref 135–145)

## 2021-06-17 LAB — MAGNESIUM: Magnesium: 1.7 mg/dL (ref 1.7–2.4)

## 2021-06-17 MED ORDER — INSULIN ASPART 100 UNIT/ML IJ SOLN
0.0000 [IU] | Freq: Three times a day (TID) | INTRAMUSCULAR | Status: DC
  Administered 2021-06-17: 3 [IU] via SUBCUTANEOUS
  Administered 2021-06-18 (×2): 1 [IU] via SUBCUTANEOUS
  Administered 2021-06-19: 3 [IU] via SUBCUTANEOUS
  Administered 2021-06-19: 2 [IU] via SUBCUTANEOUS
  Administered 2021-06-20 (×2): 1 [IU] via SUBCUTANEOUS
  Administered 2021-06-21: 2 [IU] via SUBCUTANEOUS
  Administered 2021-06-21: 1 [IU] via SUBCUTANEOUS
  Administered 2021-06-22: 3 [IU] via SUBCUTANEOUS
  Administered 2021-06-22: 2 [IU] via SUBCUTANEOUS

## 2021-06-17 MED ORDER — PREDNISONE 5 MG PO TABS
10.0000 mg | ORAL_TABLET | Freq: Every day | ORAL | Status: DC
  Administered 2021-06-17: 10 mg via ORAL
  Filled 2021-06-17: qty 2

## 2021-06-17 MED ORDER — PANTOPRAZOLE SODIUM 40 MG PO TBEC
40.0000 mg | DELAYED_RELEASE_TABLET | Freq: Two times a day (BID) | ORAL | Status: DC
  Administered 2021-06-17 – 2021-06-23 (×13): 40 mg via ORAL
  Filled 2021-06-17 (×13): qty 1

## 2021-06-17 MED ORDER — INSULIN ASPART 100 UNIT/ML IJ SOLN
0.0000 [IU] | Freq: Every day | INTRAMUSCULAR | Status: DC
  Administered 2021-06-17: 1 [IU] via SUBCUTANEOUS

## 2021-06-17 MED ORDER — LEVOFLOXACIN 500 MG PO TABS
750.0000 mg | ORAL_TABLET | Freq: Every day | ORAL | Status: DC
  Administered 2021-06-17 – 2021-06-23 (×7): 750 mg via ORAL
  Filled 2021-06-17 (×7): qty 2

## 2021-06-17 NOTE — [place] FL2 (Signed)
[place] MEDICAID FL2 LEVEL OF CARE SCREENING TOOL     IDENTIFICATION  Patient Name: [name] Birthdate: 01-07-1951 Sex: female Admission Date (Current Location): 05/26/2021  [facility] and [place] Number:  Producer, television/film/video and Address:  [facility] [place], [address], [place], [place] [address]      Provider Number: [id number]  Attending Physician Name and Address:  Rolly Salter, MD  Relative Name and Phone Number:       Current Level of Care: Hospital Recommended Level of Care: Skilled Nursing Facility Prior Approval Number:    Date Approved/Denied:   PASRR Number: [id number]  Discharge Plan: SNF    Current Diagnoses: Patient Active Problem List   Diagnosis Date Noted   Colonic diverticular abscess 05/26/2021   Bilateral pulmonary embolism (HCC) 05/26/2021   Cardiac arrhythmia 05/23/2021   Chronic kidney disease due to hypertension 05/23/2021   Chronic kidney disease, stage 3a (HCC) 05/23/2021   Diabetic renal disease (HCC) 05/23/2021   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   Gastroesophageal reflux disease 05/23/2021   Hyperlipidemia 05/23/2021   Moderate persistent asthma, uncomplicated 05/23/2021   Stroke (HCC) 05/23/2021   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 05/23/2021   Morbid obesity (HCC) 11/07/2020   Optic neuropathy, compressive 08/24/2020   Thyroid eye disease 08/04/2020   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 (HCC) 04/09/2020   Hypertensive retinopathy of both eyes 04/09/2020   Anemia 08/07/2017   Celiac disease 08/07/2017   Graves' disease with exophthalmos 08/07/2017   History of depression 08/07/2017   Diastolic dysfunction 05/25/2013   Headache(784.0) 05/18/2013   Chronic diastolic CHF (congestive heart failure) (HCC) 12/04/2011   Hemorrhoids, internal, with bleeding 11/28/2011   Seizure (HCC) 06/30/2010    Orientation RESPIRATION BLADDER Height & Weight      Self, Time, Situation, Place  Normal Continent Weight: 234 lb 2.1 oz (106.2 kg) Height:  5\' 2"  (157.5 cm)  BEHAVIORAL SYMPTOMS/MOOD NEUROLOGICAL BOWEL NUTRITION STATUS      Continent Diet  AMBULATORY STATUS COMMUNICATION OF NEEDS Skin   Limited Assist Verbally Normal                       Personal Care Assistance Level of Assistance  Bathing, Dressing Bathing Assistance: Limited assistance   Dressing Assistance: Limited assistance     Functional Limitations Info             SPECIAL CARE FACTORS FREQUENCY  PT (By licensed PT), OT (By licensed OT)     PT Frequency: 5x weekly OT Frequency: 5x weekly            Contractures Contractures Info: Not present    Additional Factors Info  Allergies   Allergies Info: gluten           Current Medications (06/17/2021):  This is the current hospital active medication list Current Facility-Administered Medications  Medication Dose Route Frequency Provider Last Rate Last Admin   Chlorhexidine Gluconate Cloth 2 % PADS 6 each  6 each Topical Daily 08/18/2021, MD   6 each at 06/17/21 0849   feeding supplement (ENSURE ENLIVE / ENSURE PLUS) liquid 237 mL  237 mL Oral BID BM 08/18/21, MD   237 mL at 06/17/21 0856   Gerhardt's butt cream   Topical PRN 08/18/21, MD       insulin aspart (novoLOG) injection 0-5 Units  0-5 Units Subcutaneous QHS Jeani Hawking, MD  insulin aspart (novoLOG) injection 0-9 Units  0-9 Units Subcutaneous TID WC Rolly Salter, MD       levofloxacin [facility]) tablet 750 mg  750 mg Oral Daily Rolly Salter, MD   750 mg at 06/17/21 0857   levothyroxine (SYNTHROID) tablet 112 mcg  112 mcg Oral Q0600 Jeani Hawking, MD   112 mcg at 06/17/21 0559   liver oil-zinc oxide (DESITIN) 40 % ointment   Topical Daily PRN Jeani Hawking, MD   Given at 05/31/21 0653   mirtazapine (REMERON) tablet 7.5 mg  7.5 mg Oral QHS Jeani Hawking, MD   7.5 mg at 06/16/21 2129   morphine 2 MG/ML injection 0.5 mg   0.5 mg Intravenous Q4H PRN Jeani Hawking, MD       ondansetron [facility]) injection 4 mg  4 mg Intravenous Q6H PRN Jeani Hawking, MD   4 mg at 06/13/21 1413   pantoprazole (PROTONIX) EC tablet 40 mg  40 mg Oral BID AC Rolly Salter, MD   40 mg at 06/17/21 1213   predniSONE (DELTASONE) tablet 10 mg  10 mg Oral Q breakfast Rolly Salter, MD   10 mg at 06/17/21 2025   saccharomyces boulardii (FLORASTOR) capsule 250 mg  250 mg Oral BID Jeani Hawking, MD   250 mg at 06/17/21 0858   simethicone (MYLICON) 40 MG/0.6ML suspension 20 mg  20 mg Oral QID Jeani Hawking, MD   20 mg at 06/17/21 0901   sodium chloride flush (NS) 0.9 % injection 10-40 mL  10-40 mL Intracatheter Q12H Jeani Hawking, MD   10 mL at 06/17/21 0854   sodium chloride flush (NS) 0.9 % injection 10-40 mL  10-40 mL Intracatheter PRN Jeani Hawking, MD       sodium chloride flush (NS) 0.9 % injection 5 mL  5 mL Intracatheter Q8H Jeani Hawking, MD   5 mL at 06/17/21 0604     Discharge Medications: Please see discharge summary for a list of discharge medications.  Relevant Imaging Results:  Relevant Lab Results:   Additional Information SS# [id number]  Annalee Genta, LCSW

## 2021-06-17 NOTE — Progress Notes (Signed)
[facility] Progress Note  Patient: [name]    KGU:[id number]  DOA: 05/26/2021     Date of Service: the patient was seen and examined on 06/17/2021  Brief hospital course: Past medical history of HFpEF, HTN, OSA, T2DM, obesity, Graves' disease on prednisone taper.  Presents with complaints of abdominal pain. Found to have perforated diverticulitis and abscess.  Had intermittent melena as well. EGD on 6/16 and 6/29.  No significant evidence of bleeding seen. Currently plan is monitor H&H toleration of oral diet.  Subjective: No nausea no vomiting.  No fever no chills.  No chest pain.  Assessment and Plan: 1.  Acute blood loss anemia Upper GI bleed Hemoglobin baseline around 12. EGD 6/16 normal esophagus duodenitis. EGD 6/29 normal esophagus erythematous duodenopathy as well as enteropathy. Hematin in gastric body. Not a candidate for colonoscopy due to diverticulitis. Not a candidate for capsule endoscopy given per GI as well. Per GI recommended continue IV PPI. If the patient has significant hematochezia CT angiogram should be performed. If the patient continues to have melena with slow drop in hemoglobin nuclear medicine RBC scan should be performed. Transfuse for hemoglobin less than 7 or hemodynamic instability. Tolerating soft diet.  2.  Perforated diverticulitis with an abscess. Initial attempt for drain placement were not successful. Repeat CT scan on 6/18 shows increasing abscess size. On 6/21 IR placed a drain. Cultures growing pansensitive Pseudomonas.  ID consulted. Treated with IV Zosyn.  Transition to p.o. Levaquin 750 mg daily. Continue for 10 days of antibiotic after removal of the IR drain. No indication for ID follow-up. Continue pain control.   3.  Acute hypoxic respiratory failure, present on admission secondary to bilateral PE with RV strain. Acute thrombocytopenia-secondary to consumption in clot, resolved PE seen on initial CT scan on  admission. Patient was started on IV heparin. PCCM was notified as the patient had RV strain on echocardiogram.  Recommended to continue anticoagulation and outpatient follow-up with pulmonology Dr. Judeth Horn. Due to GI bleed anticoagulation was discontinued. IVC filter placed on 6/16 DVT prophylaxis was initiated after stabilization of GI bleed with Lovenox on 6/23.   Discontinue on 6/28 again due to GI bleed Oxygenation currently stable on room air. Not a candidate anticoagulation due to ongoing acute GI bleed.   4.  Type 2 diabetes mellitus, uncontrolled with hyper and hypoglycemia without any complication Continue sliding scale insulin.   5.  Hypothyroidism, Graves' disease, exophthalmos. On steroids at home as well as levothyroxine. Continue levothyroxine. Per endocrine patient was started on prednisone taper, in the hospital patient is on Solu-Medrol.  Transition to p.o. prednisone 10 mg daily.   6.  HFpEF No evidence of volume overload. Monitor.   7.  CKD 3A. Renal function stable. Monitor.   8.  Morbid obesity. Body mass index is 42.82 kg/m.  Placing the patient at high risk poor outcome. May have undiagnosed sleep apnea as well.  Will benefit from diagnostic evaluation outpatient.   10.  Suspected depression.  Poor p.o. intake/anorexia Continue Remeron.  Tolerating well.   11.  Hypotensive episode.  6/29 resolved Before EGD blood pressure dropped significantly to 70s. Patient was: 1 L normal saline bolus as well as 1 PRBC transfusion. Blood pressure improved after this.  PICC line removed on 7/2.  Scheduled Meds:  Chlorhexidine Gluconate Cloth  6 each Topical Daily   feeding supplement  237 mL Oral BID BM   insulin aspart  0-5 Units Subcutaneous QHS   insulin aspart  0-9 Units Subcutaneous TID WC   levofloxacin  750 mg Oral Daily   levothyroxine  112 mcg Oral Q0600   mirtazapine  7.5 mg Oral QHS   pantoprazole  40 mg Oral BID AC   predniSONE  10 mg Oral Q  breakfast   saccharomyces boulardii  250 mg Oral BID   simethicone  20 mg Oral QID   sodium chloride flush  10-40 mL Intracatheter Q12H   sodium chloride flush  5 mL Intracatheter Q8H   Continuous Infusions:   PRN Meds: Gerhardt's butt cream, liver oil-zinc oxide, morphine injection, ondansetron (ZOFRAN) IV, sodium chloride flush  Body mass index is 42.82 kg/m.  Nutrition Problem: Inadequate oral intake Etiology: altered GI function, acute illness     DVT Prophylaxis: SCD, pharmacological prophylaxis contraindicated due to GI bleed     Advance goals of care discussion: Pt is Full code.  Family Communication: no family was present at bedside, at the time of interview.   Data Reviewed: I have personally reviewed and interpreted daily labs, tele strips, imaging. Stable potassium.  Stable serum creatinine.  Stable hemoglobin.  Physical Exam:  General: Appear in mild distress, no Rash; Oral Mucosa Clear, moist. no Abnormal Neck Mass Or lumps, Conjunctiva normal  Cardiovascular: S1 and S2 Present, no Murmur, Respiratory: good respiratory effort, Bilateral Air entry present and CTA, no Crackles, no wheezes Abdomen: Bowel Sound present, Soft and no tenderness Extremities: no Pedal edema Neurology: alert and oriented to time, place, and person affect appropriate. no new focal deficit Gait not checked due to patient safety concerns   Vitals:   06/17/21 0310 06/17/21 0806 06/17/21 1206 06/17/21 1606  BP: 123/87 118/79 105/68 122/80  Pulse: 77 80 90 94  Resp: 13 14 12 17   Temp: (!) 97.5 F (36.4 C) 97.7 F (36.5 C) 98 F (36.7 C) 97.8 F (36.6 C)  TempSrc: Oral Oral Oral Oral  SpO2: 98% 99% 96% 95%  Weight:      Height:        Disposition:  Status is: Inpatient  Remains inpatient appropriate because:Inpatient level of care appropriate due to severity of illness  Dispo: The patient is from: Home              Anticipated d/c is to: SNF              Patient currently is  not medically stable to d/c.   Difficult to place patient No if hemoglobin remained stable over the next 24 hours and tolerating oral diet, can go to CIR  Time spent: 35 minutes. I reviewed all nursing notes, pharmacy notes, vitals, pertinent old records. I have discussed plan of care as described above with RN.  Author: , MD [facility] 06/17/2021 7:20 PM  To reach On-call, see care teams to locate the attending and reach out via [email or web address]. Between 7PM-7AM, please contact night-coverage If you still have difficulty reaching the attending provider, please page the [facility] (Director on Call) for [facility] on amion for assistance.

## 2021-06-17 NOTE — TOC Initial Note (Addendum)
Transition of Care [facility]) - Initial/Assessment Note    Patient Details  Name: [name] MRN: [id number] Date of Birth: [birth year]  [facility]) CM/SW Contact:    Oretha Milch, LCSW Phone Number: 06/17/2021, 10:20 AM  Clinical Narrative:    CSW met with patient to discuss SNF referrals and process as her insurance denied authorization for CIR. Patient reported she wanted to be able to walk and build her strength back up but requested CSW speak with her daughter [name] regarding SNF process. CSW left HIPPA compliant voicemail on her phone and will update note as it progresses.              12:47p: CSW had family meeting with patient, daughter, and daughter [name] via phone. CSW explained the SNF process with patient and family and answered questions regarding insurance. CSW noted preference for [facility] and was given consent to refer to facilities in [facility]. CSW has began the referral process.      Expected Discharge Plan: Skilled Nursing Facility Barriers to Discharge: SNF Pending bed offer   Patient Goals and CMS Choice Patient states their goals for this hospitalization and ongoing recovery are:: "I want to walk and be on my own again." CMS Medicare.gov Compare Post Acute Care list provided to:: Patient Choice offered to / list presented to : Patient, Adult Children  Expected Discharge Plan and Services Expected Discharge Plan: [place] Acute Care Choice: [place]                                        Prior Living Arrangements/Services   Lives with:: Self Patient language and need for interpreter reviewed:: Yes Do you feel safe going back to the place where you live?: Yes      Need for Family Participation in Patient Care: Yes (Comment) Care giver support system in place?: No (comment)   Criminal Activity/Legal Involvement Pertinent to Current Situation/Hospitalization: No - Comment as  needed  Activities of Daily Living Home Assistive Devices/Equipment: Cane (specify quad or straight), Walker (specify type) ADL Screening (condition at time of admission) Patient's cognitive ability adequate to safely complete daily activities?: Yes Is the patient deaf or have difficulty hearing?: No Does the patient have difficulty seeing, even when wearing glasses/contacts?: Yes Does the patient have difficulty concentrating, remembering, or making decisions?: No Patient able to express need for assistance with ADLs?: Yes Does the patient have difficulty dressing or bathing?: Yes Independently performs ADLs?: No Communication: Independent Dressing (OT): Needs assistance Is this a change from baseline?: Pre-admission baseline Grooming: Needs assistance Is this a change from baseline?: Pre-admission baseline Feeding: Independent Bathing: Needs assistance Is this a change from baseline?: Pre-admission baseline Toileting: Needs assistance Is this a change from baseline?: Pre-admission baseline In/Out Bed: Needs assistance Is this a change from baseline?: Pre-admission baseline Walks in Home: Needs assistance Is this a change from baseline?: Pre-admission baseline Does the patient have difficulty walking or climbing stairs?: Yes Weakness of Legs: Both Weakness of Arms/Hands: Both  Permission Sought/Granted Permission sought to share information with : Facility Sport and exercise psychologist, Family Supports Permission granted to share information with : Yes, Verbal Permission Granted              Emotional Assessment Appearance:: Appears stated age Attitude/Demeanor/Rapport: Apprehensive Affect (typically observed): Anxious Orientation: : Oriented to Self, Oriented to  Place, Oriented to  Time, Oriented to Situation Alcohol / Substance Use: Not Applicable Psych Involvement: No (comment)  Admission diagnosis:  Hypokalemia [E87.6] Diverticulitis of colon with perforation  [K57.20] Bilateral pulmonary embolism (HCC) [I26.99] Diverticular disease of intestine with perforation and abscess [K57.80] Acute diverticulitis [K57.92] Patient Active Problem List   Diagnosis Date Noted   Colonic diverticular abscess 05/26/2021   Bilateral pulmonary embolism ([place]) 05/26/2021   Cardiac arrhythmia 05/23/2021   Chronic kidney disease due to hypertension 05/23/2021   Chronic kidney disease, stage 3a ([place]) 05/23/2021   Diabetic renal disease ([place]) 05/23/2021   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   Gastroesophageal reflux disease 05/23/2021   Hyperlipidemia 05/23/2021   Moderate persistent asthma, uncomplicated 93/26/7124   Stroke ([place]) 05/23/2021   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 05/23/2021   Morbid obesity ([place]) 11/07/2020   Optic neuropathy, compressive 08/24/2020   Thyroid eye disease 08/04/2020   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 ([place]) 04/09/2020   Hypertensive retinopathy of both eyes 04/09/2020   Anemia 08/07/2017   Celiac disease 08/07/2017   Graves' disease with exophthalmos 08/07/2017   History of depression 58/08/9832   Diastolic dysfunction 82/50/5397   Headache(784.0) 05/18/2013   Chronic diastolic CHF (congestive heart failure) ([place]) 12/04/2011   Hemorrhoids, internal, with bleeding 11/28/2011   Seizure ([place]) 06/30/2010   PCP:  Mckinley Jewel, MD Pharmacy:   [id number] [place] [id number] [id number]: [phone]: [phone] [id number] [place] - [id number] [place] [id number]: [phone]: [phone]   Social Determinants of Health (SDOH) Interventions    Readmission Risk Interventions No flowsheet data found.

## 2021-06-18 LAB — GLUCOSE, CAPILLARY
Glucose-Capillary: 88 mg/dL (ref 70–99)
Glucose-Capillary: 124 mg/dL — ABNORMAL HIGH (ref 70–99)
Glucose-Capillary: 146 mg/dL — ABNORMAL HIGH (ref 70–99)
Glucose-Capillary: 128 mg/dL — ABNORMAL HIGH (ref 70–99)
Glucose-Capillary: 103 mg/dL — ABNORMAL HIGH (ref 70–99)

## 2021-06-18 MED ORDER — PREDNISONE 5 MG PO TABS
5.0000 mg | ORAL_TABLET | Freq: Every day | ORAL | Status: AC
  Administered 2021-06-18 – 2021-06-22 (×5): 5 mg via ORAL
  Filled 2021-06-18 (×5): qty 1

## 2021-06-18 MED ORDER — PREDNISONE 5 MG PO TABS: 5.0000 mg | ORAL_TABLET | Freq: Every day | ORAL | Status: DC

## 2021-06-18 MED ORDER — ALUM & MAG HYDROXIDE-SIMETH 200-200-20 MG/5ML PO SUSP
15.0000 mL | Freq: Four times a day (QID) | ORAL | Status: DC | PRN
  Administered 2021-06-18: 15 mL via ORAL
  Filled 2021-06-18: qty 30

## 2021-06-18 MED ORDER — ACETAMINOPHEN 325 MG PO TABS: 650.0000 mg | ORAL_TABLET | Freq: Four times a day (QID) | ORAL | Status: DC | PRN

## 2021-06-18 NOTE — Progress Notes (Signed)
Referring Physician(s): Dr Marlowe Sax  Supervising Physician: Markus Daft  Patient Status:  [facility] - In-pt  Chief Complaint:  Diverticular abscess drain in IR 6/21  Subjective:  Abscessogram 06/14/21: IMPRESSION: Pelvic abscessogram demonstrates significant residual cavity. No fistulous communication identified. Drain left in place given persistent cavity  25 cc OP yesterday 10 cc in bag now  Wbc wnl; afeb  Allergies: Gluten meal  Medications: Prior to Admission medications   Medication Sig Start Date End Date Taking? Authorizing Provider  albuterol (VENTOLIN HFA) 108 (90 Base) MCG/ACT inhaler Inhale 2 puffs into the lungs every 6 (six) hours as needed for wheezing or shortness of breath.   Yes [provider]  amLODipine (NORVASC) 5 MG tablet Take 1 tablet (5 mg total) by mouth daily. 10/04/20  Yes Nahser, Wonda Cheng, MD  carvedilol (COREG) 25 MG tablet Take 1 tablet (25 mg total) by mouth 2 (two) times daily with a meal. 12/05/12  Yes Nahser, Wonda Cheng, MD  esomeprazole (NEXIUM) 20 MG capsule Take 1 capsule by mouth daily.   Yes [provider]  furosemide (LASIX) 20 MG tablet Take 1 tablet (20 mg total) by mouth daily. 05/22/21  Yes Hans Eden, NP  levothyroxine (SYNTHROID) 112 MCG tablet Take 1 tablet by mouth Monday through Saturday and 1/2 tablet on Sunday. 11/08/20  Yes Elayne Snare, MD  metFORMIN (GLUCOPHAGE) 500 MG tablet Take 500 mg by mouth 2 (two) times daily. 08/23/20  Yes [provider]  OVER THE COUNTER MEDICATION Take 1 tablet by mouth daily. Medication: Vitamin b12 3000 mcg   Yes [provider]  potassium chloride (KLOR-CON) 10 MEQ tablet Take 2 tablets (20 mEq total) by mouth daily. 11/07/20  Yes Nahser, Wonda Cheng, MD  predniSONE (DELTASONE) 20 MG tablet Take 60 mg by mouth daily with breakfast.   Yes [provider]  rosuvastatin (CRESTOR) 5 MG tablet Take 1 tablet (5 mg total) by mouth daily. 11/08/20  Yes Nahser,  Wonda Cheng, MD  Blood Glucose Monitoring Suppl (ONETOUCH VERIO REFLECT) w/Device KIT 1 each by Other route as directed. 08/10/20   [provider]  Fluticasone-Salmeterol (ADVAIR) 250-50 MCG/DOSE AEPB Inhale 1 puff into the lungs daily as needed for allergies.    [provider]  glucose blood (ONETOUCH VERIO) test strip Use as intstructed to check blood sugar once a day Dx Code E11.9 02/18/21   Elayne Snare, MD  Lancets [facility] ULTRASOFT) lancets Use as instructed 10/13/20   Elayne Snare, MD  meclizine (ANTIVERT) 25 MG tablet Take 1 tablet by mouth daily as needed for dizziness.    [provider]     Vital Signs: BP 122/73 (BP Location: Right Wrist)   Pulse 76   Temp 97.8 F (36.6 C) (Oral)   Resp 14   Ht _0  (1.575 m)   Wt 234 lb 2.1 oz (106.2 kg)   SpO2 97%   BMI 42.82 kg/m   Physical Exam Skin:    General: Skin is warm.     Comments: Drain site is c/d/I NT no sign of infection OP serous color 25 cc yesterday      Imaging: No results found.  Labs:  CBC: Recent Labs    06/15/21 0500 06/15/21 1810 06/16/21 0253 06/17/21 0450  WBC 11.6* 12.6* 10.3 9.1  HGB 8.4* 9.1* 8.5* 8.6*  HCT 26.4* 27.6* 25.6* 26.8*  PLT 141* 169 143* 161    COAGS: Recent Labs    06/06/21 0230  INR 1.1    BMP: Recent Labs    11/07/20 1058 03/26/21 1927 06/14/21 0632 06/15/21 0500 06/16/21 0253 06/17/21 0450  NA 141   < > 140 138 137 139  K 4.1   < > 3.9 3.2* 4.4 3.9  CL 103   < > 111 109 111 109  CO2 24   < > 23 23 20* 22  GLUCOSE 118*   < > 92 91 99 101*  BUN 13   < > 16 11 6* <5*  CALCIUM 9.9   < > 8.1* 7.9* 8.3* 8.1*  CREATININE 1.06*   < > 0.98 0.76 0.84 0.73  GFRNONAA 53*   < > >60 >60 >60 >60  GFRAA 61  --   --   --   --   --    < > = values in this interval not displayed.    LIVER FUNCTION TESTS: Recent Labs    06/13/21 0627 06/14/21 0632 06/15/21 0500 06/16/21 0253  BILITOT 0.5 0.5 0.5 0.7  AST _0 ALT _1 ALKPHOS 69 53 44 46  PROT 4.9* 4.1* 3.8* 4.0*  ALBUMIN 2.4* 2.0* 1.9* 2.0*    Assessment and Plan:  Continue drain CCS following If home with drain- will need to flush daily 5 cc sterile saline Record OP Pt will hear from [facility] for follow up appt  Electronically Signed: Lavonia Drafts, PA-C 06/18/2021, 8:31 AM   I spent a total of 15 Minutes at the the patient's bedside AND on the patient's hospital floor or unit, greater than 50% of which was counseling/coordinating care for abscess drain

## 2021-06-18 NOTE — TOC Progression Note (Signed)
Transition of Care [facility]) - Progression Note    Patient Details  Name: [name] MRN: [id number] Date of Birth: 1951/03/22  [facility] CM/SW Contact  Annalee Genta, LCSW Phone Number: 06/18/2021, 9:55 AM  Clinical Narrative:    CSW informed by attending patient is likely ready for DC to SNF. CSW provided patient and daughter a list of Medicare.gov SNF ratings and informed them of the three current bed offers; [facility], [address], and [facility]. CSW noted preference to wait to hear from [facility] and possible a few further ones and discussed with family reasonable expectations for SNF. CSW inquired if patient or daughter had additional questions or concerns and noted none at this time. TOC will continue to follow for SNF placement at this time.   Expected Discharge Plan: Skilled Nursing Facility Barriers to Discharge: SNF Pending bed offer  Expected Discharge Plan and Services Expected Discharge Plan: Skilled Nursing Facility     Post Acute Care Choice: Skilled Nursing Facility                                         Social Determinants of Health (SDOH) Interventions    Readmission Risk Interventions No flowsheet data found.

## 2021-06-18 NOTE — Progress Notes (Signed)
[facility] Progress Note  Patient: [name]    PYP:[id number]  DOA: 05/26/2021     Date of Service: the patient was seen and examined on 06/18/2021  Brief hospital course: Past medical history of HFpEF, HTN, OSA, T2DM, obesity, Graves' disease on prednisone taper.  Presents with complaints of abdominal pain. Found to have perforated diverticulitis and abscess.  Had intermittent melena as well. EGD on 6/16 and 6/29.  No significant evidence of bleeding seen. Currently plan is to proceed with discharge  Subjective: No nausea no vomiting.  No fever no chills.  No acute complaints.  Assessment and Plan: 1.  Acute blood loss anemia Upper GI bleed Hemoglobin baseline around 12. EGD 6/16 normal esophagus duodenitis. EGD 6/29 normal esophagus erythematous duodenopathy as well as enteropathy. Hematin in gastric body. Not a candidate for colonoscopy due to diverticulitis. Not a candidate for capsule endoscopy given per GI as well. Per GI recommended continue IV PPI. Tolerating soft diet. If the patient has significant hematochezia CT angiogram should be performed. If the patient continues to have melena with slow drop in hemoglobin nuclear medicine RBC scan should be performed. Transfuse for hemoglobin less than 7 or hemodynamic instability.  2.  Perforated diverticulitis with an abscess. Initial attempt for drain placement were not successful. Repeat CT scan on 6/18 shows increasing abscess size. On 6/21 IR placed a drain. Cultures growing pansensitive Pseudomonas.  ID consulted. Treated with IV Zosyn.  Transition to p.o. Levaquin 750 mg daily. Continue for 10 days of antibiotic after removal of the IR drain. No indication for ID follow-up. Continue pain control.   3.  Acute hypoxic respiratory failure, present on admission secondary to bilateral PE with RV strain. Acute thrombocytopenia-secondary to consumption in clot, resolved PE seen on initial CT scan on  admission. Patient was started on IV heparin. PCCM was notified as the patient had RV strain on echocardiogram.  Recommended to continue anticoagulation and outpatient follow-up with pulmonology Dr. Judeth Horn. Due to GI bleed anticoagulation was discontinued. IVC filter placed on 6/16 DVT prophylaxis was initiated after stabilization of GI bleed with Lovenox on 6/23.   Discontinue on 6/28 again due to GI bleed Oxygenation currently stable on room air. Not a candidate anticoagulation due to ongoing acute GI bleed.   4.  Type 2 diabetes mellitus, uncontrolled with hyper and hypoglycemia without any complication Continue sliding scale insulin.   5.  Hypothyroidism, Graves' disease, exophthalmos. On steroids at home as well as levothyroxine. Continue levothyroxine. Per endocrine patient was started on prednisone taper. In the hospital patient is on Solu-Medrol.  Now on p.o. prednisone with taper off with 5 mg daily for 5 days and then stop.   6.  HFpEF No evidence of volume overload. Monitor.   7.  CKD 3A. Renal function stable. Monitor.   8.  Morbid obesity. Body mass index is 42.82 kg/m.  Placing the patient at high risk poor outcome. May have undiagnosed sleep apnea as well.  Will benefit from diagnostic evaluation outpatient.   10.  Suspected depression.  Poor p.o. intake/anorexia Continue Remeron.  Tolerating well.   11.  Hypotensive episode.  6/29 resolved Before EGD blood pressure dropped significantly to 70s. Patient was: 1 L normal saline bolus as well as 1 PRBC transfusion. Blood pressure improved after this.  PICC line removed on 7/2.  Scheduled Meds:  Chlorhexidine Gluconate Cloth  6 each Topical Daily   feeding supplement  237 mL Oral BID BM   insulin aspart  0-5 Units Subcutaneous QHS   insulin aspart  0-9 Units Subcutaneous TID WC   levofloxacin  750 mg Oral Daily   levothyroxine  112 mcg Oral Q0600   mirtazapine  7.5 mg Oral QHS   pantoprazole  40 mg  Oral BID AC   predniSONE  5 mg Oral Q breakfast   saccharomyces boulardii  250 mg Oral BID   simethicone  20 mg Oral QID   sodium chloride flush  10-40 mL Intracatheter Q12H   sodium chloride flush  5 mL Intracatheter Q8H   Continuous Infusions:  PRN Meds: acetaminophen, alum & mag hydroxide-simeth, Gerhardt's butt cream, liver oil-zinc oxide, morphine injection, ondansetron (ZOFRAN) IV, sodium chloride flush  Body mass index is 42.82 kg/m.  Nutrition Problem: Inadequate oral intake Etiology: altered GI function, acute illness     DVT Prophylaxis: SCD, pharmacological prophylaxis contraindicated due to GI bleed     Advance goals of care discussion: Pt is Full code.  Family Communication: family was present at bedside, at the time of interview.   Data Reviewed: I have personally reviewed and interpreted daily labs, tele strips, imaging. Stable potassium.  Stable serum creatinine.  Stable hemoglobin.  Physical Exam:  General: Appear in mild distress, no Rash; Oral Mucosa Clear, moist. no Abnormal Neck Mass Or lumps, Conjunctiva normal  Cardiovascular: S1 and S2 Present, no Murmur, Respiratory: good respiratory effort, Bilateral Air entry present and CTA, no Crackles, no wheezes Abdomen: Bowel Sound present, Soft and no tenderness Extremities: no Pedal edema Neurology: alert and oriented to time, place, and person affect appropriate. no new focal deficit Gait not checked due to patient safety concerns  Vitals:   06/18/21 0355 06/18/21 0742 06/18/21 1041 06/18/21 1551  BP: (!) 126/93 122/73 96/74 99/75   Pulse: 82 76 (!) 102 93  Resp: 19 14 14 20   Temp: 97.8 F (36.6 C) 97.8 F (36.6 C) 97.6 F (36.4 C) 98 F (36.7 C)  TempSrc: Oral Oral Oral Oral  SpO2: 93% 97% 97% 95%  Weight:      Height:        Disposition:  Status is: Inpatient  Remains inpatient appropriate because:Inpatient level of care appropriate due to severity of illness  Dispo: The patient is from:  Home              Anticipated d/c is to: SNF              Patient currently is medically stable to d/c.   Difficult to place patient No   Time spent: 35 minutes. I reviewed all nursing notes, pharmacy notes, vitals, pertinent old records. I have discussed plan of care as described above with RN.  Author: , MD [facility] 06/18/2021 7:15 PM  To reach On-call, see care teams to locate the attending and reach out via [email or web address]. Between 7PM-7AM, please contact night-coverage If you still have difficulty reaching the attending provider, please page the [facility] (Director on Call) for [facility] on amion for assistance.

## 2021-06-19 LAB — BASIC METABOLIC PANEL
Anion gap: 4 — ABNORMAL LOW (ref 5–15)
BUN: <5 mg/dL — ABNORMAL LOW (ref 8–23)
CO2: 25 mmol/L (ref 22–32)
Calcium: 8.4 mg/dL — ABNORMAL LOW (ref 8.9–10.3)
Chloride: 108 mmol/L (ref 98–111)
Creatinine, Ser: 0.91 mg/dL (ref 0.44–1.00)
GFR, Estimated: >60 mL/min (ref >60)
Glucose, Bld: 93 mg/dL (ref 70–99)
Potassium: 3.5 mmol/L (ref 3.5–5.1)
Sodium: 137 mmol/L (ref 135–145)

## 2021-06-19 LAB — GLUCOSE, CAPILLARY
Glucose-Capillary: 101 mg/dL — ABNORMAL HIGH (ref 70–99)
Glucose-Capillary: 162 mg/dL — ABNORMAL HIGH (ref 70–99)
Glucose-Capillary: 162 mg/dL — ABNORMAL HIGH (ref 70–99)
Glucose-Capillary: 68 mg/dL — ABNORMAL LOW (ref 70–99)
Glucose-Capillary: 96 mg/dL (ref 70–99)

## 2021-06-19 LAB — CBC
HCT: 26.5 % — ABNORMAL LOW (ref 36.0–46.0)
Hemoglobin: 8.3 g/dL — ABNORMAL LOW (ref 12.0–15.0)
MCH: 31.2 pg (ref 26.0–34.0)
MCHC: 31.3 g/dL (ref 30.0–36.0)
MCV: 99.6 fL (ref 80.0–100.0)
Platelets: 185 10*3/uL (ref 150–400)
RBC: 2.66 MIL/uL — ABNORMAL LOW (ref 3.87–5.11)
RDW: 19.7 % — ABNORMAL HIGH (ref 11.5–15.5)
WBC: 9 10*3/uL (ref 4.0–10.5)
nRBC: 0 % (ref 0.0–0.2)

## 2021-06-19 LAB — MAGNESIUM: Magnesium: 1.6 mg/dL — ABNORMAL LOW (ref 1.7–2.4)

## 2021-06-19 MED ORDER — POTASSIUM CHLORIDE 20 MEQ PO PACK
20.0000 meq | PACK | Freq: Once | ORAL | Status: AC
  Administered 2021-06-19: 20 meq via ORAL
  Filled 2021-06-19: qty 1

## 2021-06-19 MED ORDER — MAGNESIUM SULFATE 2 GM/50ML IV SOLN
2.0000 g | Freq: Once | INTRAVENOUS | Status: AC
  Administered 2021-06-19: 2 g via INTRAVENOUS
  Filled 2021-06-19: qty 50

## 2021-06-19 NOTE — Progress Notes (Signed)
[facility] Progress Note  Patient: [name]    LOV:[id number]  DOA: 05/26/2021     Date of Service: the patient was seen and examined on 06/19/2021  Brief hospital course: Past medical history of HFpEF, HTN, OSA, T2DM, obesity, Graves' disease on prednisone taper.  Presents with complaints of abdominal pain. Found to have perforated diverticulitis and abscess.  Had intermittent melena as well. EGD on 6/16 and 6/29.  No significant evidence of bleeding seen. Currently plan is to proceed with discharge to SNF once bed is available  Subjective: No acute complaint.  No nausea or vomiting but no fever no chills.  No diarrhea since yesterday.  Assessment and Plan: 1.  Acute blood loss anemia Upper GI bleed Hemoglobin baseline around 12. EGD 6/16 normal esophagus duodenitis. EGD 6/29 normal esophagus erythematous duodenopathy as well as enteropathy. Hematin in gastric body. Not a candidate for colonoscopy due to diverticulitis. Not a candidate for capsule endoscopy given per GI as well. Per GI recommended continue twice daily PPI. Tolerating soft diet. If the patient has significant hematochezia CT angiogram should be performed. If the patient continues to have melena with slow drop in hemoglobin nuclear medicine RBC scan should be performed. Transfuse for hemoglobin less than 7 or hemodynamic instability.  2.  Perforated diverticulitis with an abscess. Initial attempt for drain placement were not successful. Repeat CT scan on 6/18 shows increasing abscess size. On 6/21 IR placed a drain. Cultures growing pansensitive Pseudomonas.  ID consulted. Treated with IV Zosyn.  Transition to p.o. Levaquin 750 mg daily. Continue for 10 days of antibiotic after removal of the IR drain. No indication for ID follow-up. Continue pain control. Tolerating soft diet. Outpatient colonoscopy once stable.   3.  Acute hypoxic respiratory failure, present on admission secondary to bilateral  PE with RV strain. Acute thrombocytopenia-secondary to consumption in clot, resolved PE seen on initial CT scan on admission. Patient was started on IV heparin. PCCM was notified as the patient had RV strain on echocardiogram.  Recommended to continue anticoagulation and outpatient follow-up with pulmonology Dr. Judeth Horn. Due to GI bleed anticoagulation was discontinued. IVC filter placed on 6/16 DVT prophylaxis was initiated after stabilization of GI bleed with Lovenox on 6/23.   Discontinue on 6/28 again due to GI bleed Oxygenation currently stable on room air. Not a candidate anticoagulation due to ongoing acute GI bleed.   4.  Type 2 diabetes mellitus, uncontrolled with hyper and hypoglycemia without any complication Continue sliding scale insulin.   5.  Hypothyroidism, Graves' disease, exophthalmos. On steroids at home as well as levothyroxine. Continue levothyroxine. Per endocrine patient was started on prednisone taper. In the hospital patient is on Solu-Medrol.  Now on p.o. prednisone with taper off with 5 mg daily for 5 days and then stop.   6.  HFpEF No evidence of volume overload. Monitor.   7.  CKD 3A. Renal function stable. Monitor.   8.  Morbid obesity. Body mass index is 42.82 kg/m.  Placing the patient at high risk poor outcome. May have undiagnosed sleep apnea as well.  Will benefit from diagnostic evaluation outpatient.   10.  Suspected depression.  Poor p.o. intake/anorexia Continue Remeron.  Tolerating well. Patient can have food from home.   11.  Hypotensive episode.  6/29 resolved Before EGD blood pressure dropped significantly to 70s. Patient was: 1 L normal saline bolus as well as 1 PRBC transfusion. Blood pressure improved after this.  PICC line removed on 7/2.  Scheduled  Meds:  feeding supplement  237 mL Oral BID BM   insulin aspart  0-5 Units Subcutaneous QHS   insulin aspart  0-9 Units Subcutaneous TID WC   levofloxacin  750 mg Oral  Daily   levothyroxine  112 mcg Oral Q0600   mirtazapine  7.5 mg Oral QHS   pantoprazole  40 mg Oral BID AC   predniSONE  5 mg Oral Q breakfast   saccharomyces boulardii  250 mg Oral BID   simethicone  20 mg Oral QID   sodium chloride flush  5 mL Intracatheter Q8H   Continuous Infusions:  PRN Meds: acetaminophen, alum & mag hydroxide-simeth, Gerhardt's butt cream, liver oil-zinc oxide, morphine injection, ondansetron (ZOFRAN) IV  Body mass index is 42.82 kg/m.  Nutrition Problem: Inadequate oral intake Etiology: altered GI function, acute illness     DVT Prophylaxis: SCD, pharmacological prophylaxis contraindicated due to GI bleed     Advance goals of care discussion: Pt is Full code.  Family Communication: family was present at bedside, at the time of interview.   Data Reviewed: I have personally reviewed and interpreted daily labs, tele strips, imaging. Stable potassium.  Stable serum creatinine.  Stable hemoglobin.  Physical Exam:  General: Appear in mild distress, no Rash; Oral Mucosa Clear, moist. no Abnormal Neck Mass Or lumps, Conjunctiva normal  Cardiovascular: S1 and S2 Present, no Murmur, Respiratory: good respiratory effort, Bilateral Air entry present and CTA, no Crackles, no wheezes Abdomen: Bowel Sound present, Soft and no tenderness Extremities: no Pedal edema Neurology: alert and oriented to time, place, and person affect appropriate. no new focal deficit Gait not checked due to patient safety concerns  Vitals:   06/18/21 2342 06/19/21 0342 06/19/21 0700 06/19/21 1100  BP: 127/88 119/88 (!) 132/3 112/75  Pulse: 87 83  (!) 108  Resp:   18   Temp: 97.8 F (36.6 C) 97.7 F (36.5 C) 97.9 F (36.6 C) 99.3 F (37.4 C)  TempSrc: Oral Oral Oral   SpO2: 95% 97%  93%  Weight:      Height:        Disposition:  Status is: Inpatient  Remains inpatient appropriate because:Inpatient level of care appropriate due to severity of illness  Dispo: The patient  is from: Home              Anticipated d/c is to: SNF              Patient currently is medically stable to d/c.   Difficult to place patient No   Time spent: 35 minutes. I reviewed all nursing notes, pharmacy notes, vitals, pertinent old records. I have discussed plan of care as described above with RN.  Author: Lynden Oxford, MD [facility] 06/19/2021 6:05 PM  To reach On-call, see care teams to locate the attending and reach out via [email or web address]. Between 7PM-7AM, please contact night-coverage If you still have difficulty reaching the attending provider, please page the [facility] (Director on Call) for [facility] on amion for assistance.

## 2021-06-19 NOTE — TOC Progression Note (Signed)
Transition of Care [facility]) - Progression Note    Patient Details  Name: Nikka Hakimian MRN: [id number] Date of Birth: November 02, 1951  Transition of Care [facility]) CM/SW [place], [place] Phone Number: 06/19/2021, 11:32 AM  Clinical Narrative:     CSW met with patient at bedside. CSW discussed rehab facilities. Patient requested to talk with her daughter, Loree Fee. CSW called, left voice message for daughter.   Thurmond Butts, MSW, LCSW Clinical Social Worker    Expected Discharge Plan: Skilled Nursing Facility Barriers to Discharge: SNF Pending bed offer  Expected Discharge Plan and Services Expected Discharge Plan: [place] Choice: [place]                                         Social Determinants of Health (SDOH) Interventions    Readmission Risk Interventions No flowsheet data found.

## 2021-06-19 NOTE — Progress Notes (Signed)
Physical Therapy Treatment Patient Details Name: [name] MRN: [id number] DOB: 1951/11/21 Today's Date: 06/19/2021    History of Present Illness 70 y.o. female admitted on 05/26/21 for sigmoid diverticulitis with perforation and abscess, IR consulted for surgery vs drain placement and had an unsucessful attempt on 05/28/21.  Bil PE started on IV heparin. S/p CT guided placement of a 10 Fr drainage catheter placement into the diverticular abscess within the midline of the lower abdomen/upper pelvis 6/21. S/p pelvic abscessogram 6/29. PMH including anemia, CHF, DM, graves disease, HTN, obesity, stroke, seizure, and thyroid disease.    PT Comments    Patient continues to have significant jump in HR from supine (82 bpm) to standing (140 bpm).  Patient experiences no symptoms with elevated HR and decided to attempt ambulation despite elevated HR. Pt able to ambulate 40 ft, seated rest, and again 40 ft with max HR 155 bpm and only mild dyspnea and legs feeling tired. Pt states "it felt good to walk!"    Follow Up Recommendations  SNF     Equipment Recommendations  Rolling walker with 5" wheels;Wheelchair (measurements PT);Wheelchair cushion (measurements PT);Hospital bed    Recommendations for Other Services       Precautions / Restrictions Precautions Precautions: Fall Precaution Comments: flexiseal, R gravity drain Restrictions Weight Bearing Restrictions: No    Mobility  Bed Mobility Overal bed mobility: Needs Assistance Bed Mobility: Supine to Sit     Supine to sit: HOB elevated;Min guard     General bed mobility comments: to EOB without physcial assist, however HOB elevated; guarding for lines/tubes    Transfers Overall transfer level: Needs assistance Equipment used: Rolling walker (2 wheeled) Transfers: Sit to/from Stand Sit to Stand: Min assist         General transfer comment: Cues for hand placement, use of momentum to power up into standing, up to minA  to achieve upright. Cues to reach back for chair prior to sitting to control descent.  Ambulation/Gait Ambulation/Gait assistance: Min assist Gait Distance (Feet): 40 Feet (seated rest; 40 ft) Assistive device: Rolling walker (2 wheeled) Gait Pattern/deviations: Decreased stride length;Trunk flexed;Step-through pattern;Shuffle Gait velocity: decreased   General Gait Details: Deferred as pt's HR increased to 140 with static standing. No dyspnea or dizziness. Proceeded to walking as risks of not walking outweigh risks of walking. HR monitored closely with max HR 155 bpm. Seated rest and on return walk HR 120s for 30 ft and in last 10 ft elevated to 150. Pt with normal amount of dyspnea with walking this distance after several days of no walking.   Stairs             Wheelchair Mobility    Modified Rankin (Stroke Patients Only)       Balance Overall balance assessment: Needs assistance Sitting-balance support: No upper extremity supported;Feet supported Sitting balance-Leahy Scale: Fair     Standing balance support: Bilateral upper extremity supported;During functional activity Standing balance-Leahy Scale: Poor Standing balance comment: reliant on support in standing.                            Cognition Arousal/Alertness: Awake/alert Behavior During Therapy: WFL for tasks assessed/performed Overall Cognitive Status: Impaired/Different from baseline                   Orientation Level:  (NT) Current Attention Level: Sustained   Following Commands: Follows one step commands with increased time;Follows one step commands  consistently;Follows multi-step commands inconsistently   Awareness: Emergent Problem Solving: Difficulty sequencing;Requires verbal cues;Slow processing;Decreased initiation General Comments: Able to recall previous sessions and being limited in ambulation due to elevated BP (actually due to elevated HR); required cues for hand  placment each transfer      Exercises General Exercises - Lower Extremity Long Arc Quad: Both;Seated;5 reps (for "warm-up" prior to ambulation) Hip Flexion/Marching: Both;5 reps;Seated (for warm-up prior to walking)    General Comments        Pertinent Vitals/Pain Pain Assessment: Faces Faces Pain Scale: No hurt    Home Living                      Prior Function            PT Goals (current goals can now be found in the care plan section) Acute Rehab PT Goals Patient Stated Goal: to improve PT Goal Formulation: With patient Time For Goal Achievement: 06/27/21 Potential to Achieve Goals: Good Progress towards PT goals: Progressing toward goals    Frequency    Min 3X/week      PT Plan Current plan remains appropriate    Co-evaluation              AM-PAC PT "6 Clicks" Mobility   Outcome Measure  Help needed turning from your back to your side while in a flat bed without using bedrails?: A Little Help needed moving from lying on your back to sitting on the side of a flat bed without using bedrails?: A Little Help needed moving to and from a bed to a chair (including a wheelchair)?: A Little Help needed standing up from a chair using your arms (e.g., wheelchair or bedside chair)?: A Little Help needed to walk in hospital room?: A Little Help needed climbing 3-5 steps with a railing? : Total 6 Click Score: 16    End of Session Equipment Utilized During Treatment: Gait belt Activity Tolerance: Patient tolerated treatment well Patient left: with call bell/phone within reach;in chair;with chair alarm set;with family/visitor present Nurse Communication: Mobility status;Other (comment) (HR with activity) PT Visit Diagnosis: Muscle weakness (generalized) (M62.81);Difficulty in walking, not elsewhere classified (R26.2);Unsteadiness on feet (R26.81);Other abnormalities of gait and mobility (R26.89)     Time: 3818-2993 PT Time Calculation (min) (ACUTE  ONLY): 23 min  Charges:  $Gait Training: 23-37 mins                      [name], PT Pager [phone]    [name] 06/19/2021, 11:30 AM

## 2021-06-20 LAB — GLUCOSE, CAPILLARY
Glucose-Capillary: 87 mg/dL (ref 70–99)
Glucose-Capillary: 85 mg/dL (ref 70–99)
Glucose-Capillary: 144 mg/dL — ABNORMAL HIGH (ref 70–99)
Glucose-Capillary: 124 mg/dL — ABNORMAL HIGH (ref 70–99)
Glucose-Capillary: 107 mg/dL — ABNORMAL HIGH (ref 70–99)

## 2021-06-20 MED ORDER — MAGNESIUM SULFATE 2 GM/50ML IV SOLN
2.0000 g | Freq: Once | INTRAVENOUS | Status: AC
  Administered 2021-06-20: 2 g via INTRAVENOUS
  Filled 2021-06-20: qty 50

## 2021-06-20 NOTE — TOC Progression Note (Signed)
Transition of Care [facility]) - Progression Note    Patient Details  Name: Tambria Pfannenstiel MRN: [id number] Date of Birth: 1951/06/01  Transition of Care [facility]) CM/SW Contact  Eduard Roux, [place] Phone Number: 06/20/2021, 11:41 AM  Clinical Narrative:     Jeanene Erb patient's daughter Alphonzo Lemmings @ [phone] & [phone]- did not leave message- will call again today.  Antony Blackbird, MSW, LCSW Clinical Social Worker    Expected Discharge Plan: Skilled Nursing Facility Barriers to Discharge: SNF Pending bed offer  Expected Discharge Plan and Services Expected Discharge Plan: Skilled Nursing Facility     Post Acute Care Choice: Skilled Nursing Facility                                         Social Determinants of Health (SDOH) Interventions    Readmission Risk Interventions No flowsheet data found.

## 2021-06-20 NOTE — Progress Notes (Signed)
[facility] Progress Note  Patient: [name]    DUK:[id number]  DOA: 05/26/2021     Date of Service: the patient was seen and examined on 06/20/2021  Brief hospital course: Past medical history of HFpEF, HTN, OSA, T2DM, obesity, Graves' disease on prednisone taper.  Presents with complaints of abdominal pain. Found to have perforated diverticulitis and abscess.  Had intermittent melena as well. EGD on 6/16 and 6/29.  No significant evidence of bleeding seen. Currently plan is to proceed with discharge to SNF once bed is available  Subjective: No acute complaint.  No nausea no vomiting.  No fever no chills but was hypoglycemic last night although tells me that she had good breakfast and lunch this morning.  Assessment and Plan: 1.  Acute blood loss anemia Upper GI bleed Hemoglobin baseline around 12. EGD 6/16 normal esophagus duodenitis. EGD 6/29 normal esophagus erythematous duodenopathy as well as enteropathy. Hematin in gastric body. Not a candidate for colonoscopy due to diverticulitis. Not a candidate for capsule endoscopy given per GI as well. Per GI recommended continue twice daily PPI. Tolerating soft diet. If the patient has significant hematochezia CT angiogram should be performed. If the patient continues to have melena with slow drop in hemoglobin nuclear medicine RBC scan should be performed. Transfuse for hemoglobin less than 7 or hemodynamic instability.  2.  Perforated diverticulitis with an abscess. Initial attempt for drain placement were not successful. Repeat CT scan on 6/18 shows increasing abscess size. On 6/21 IR placed a drain. Cultures growing pansensitive Pseudomonas.  ID consulted. Treated with IV Zosyn.  Transition to p.o. Levaquin 750 mg daily. Continue for 10 days of antibiotic after removal of the IR drain. No indication for ID follow-up. Continue pain control. Tolerating regular diet. Outpatient colonoscopy once stable.   3.  Acute  hypoxic respiratory failure, present on admission secondary to bilateral PE with RV strain. Acute thrombocytopenia-secondary to consumption in clot, resolved PE seen on initial CT scan on admission. Patient was started on IV heparin. PCCM was notified as the patient had RV strain on echocardiogram.  Recommended to continue anticoagulation and outpatient follow-up with pulmonology Dr. Judeth Horn. Due to GI bleed anticoagulation was discontinued. IVC filter placed on 6/16 DVT prophylaxis was initiated after stabilization of GI bleed with Lovenox on 6/23.   Discontinue on 6/28 again due to GI bleed Oxygenation currently stable on room air. Not a candidate anticoagulation due to ongoing acute GI bleed.   4.  Type 2 diabetes mellitus, uncontrolled with hyper and hypoglycemia without any complication Continue sliding scale insulin.   5.  Hypothyroidism, Graves' disease, exophthalmos. On steroids at home as well as levothyroxine. Continue levothyroxine. Per endocrine patient was started on prednisone taper. In the hospital patient is on Solu-Medrol.  Now on p.o. prednisone with taper off with 5 mg daily for 5 days and then stop.   6.  HFpEF No evidence of volume overload. Monitor.   7.  CKD 3A. Renal function stable. Monitor.   8.  Morbid obesity. Body mass index is 42.82 kg/m.  Placing the patient at high risk poor outcome. May have undiagnosed sleep apnea as well.  Will benefit from diagnostic evaluation outpatient.   10.  Suspected depression.  Poor p.o. intake/anorexia Continue Remeron.  Tolerating well. Patient can have food from home.   11.  Hypotensive episode.  6/29 resolved Before EGD blood pressure dropped significantly to 70s. Patient was: 1 L normal saline bolus as well as 1 PRBC transfusion. Blood  pressure improved after this.  PICC line removed on 7/2.  Scheduled Meds:  feeding supplement  237 mL Oral BID BM   insulin aspart  0-5 Units Subcutaneous QHS    insulin aspart  0-9 Units Subcutaneous TID WC   levofloxacin  750 mg Oral Daily   levothyroxine  112 mcg Oral Q0600   mirtazapine  7.5 mg Oral QHS   pantoprazole  40 mg Oral BID AC   predniSONE  5 mg Oral Q breakfast   saccharomyces boulardii  250 mg Oral BID   simethicone  20 mg Oral QID   sodium chloride flush  5 mL Intracatheter Q8H   Continuous Infusions:  PRN Meds: acetaminophen, alum & mag hydroxide-simeth, Gerhardt's butt cream, liver oil-zinc oxide, morphine injection, ondansetron (ZOFRAN) IV  Body mass index is 42.82 kg/m.  Nutrition Problem: Inadequate oral intake Etiology: altered GI function, acute illness     DVT Prophylaxis: SCD, pharmacological prophylaxis contraindicated due to GI bleed     Advance goals of care discussion: Pt is Full code.  Family Communication: no family was present at bedside, at the time of interview.   Data Reviewed: I have personally reviewed and interpreted daily labs, tele strips, imaging. CABG overnight 68 at 10 PM.  Currently 144 and 124.  Physical Exam:  General: Appear in mild distress, no Rash; Oral Mucosa Clear, moist. no Abnormal Neck Mass Or lumps, Conjunctiva normal  Cardiovascular: S1 and S2 Present, no Murmur, Respiratory: good respiratory effort, Bilateral Air entry present and CTA, no Crackles, no wheezes Abdomen: Bowel Sound present, Soft and no tenderness Extremities: no Pedal edema Neurology: alert and oriented to time, place, and person affect appropriate. no new focal deficit Gait not checked due to patient safety concerns  Vitals:   06/20/21 0900 06/20/21 1000 06/20/21 1200 06/20/21 1600  BP:   90/66 117/75  Pulse: 75 100 100 94  Resp:   20 18  Temp:   98.6 F (37 C) 98.2 F (36.8 C)  TempSrc:   Oral Oral  SpO2: 100% 93%  97%  Weight:      Height:        Disposition:  Status is: Inpatient  Remains inpatient appropriate because:Inpatient level of care appropriate due to severity of illness  Dispo:  The patient is from: Home              Anticipated d/c is to: SNF              Patient currently is medically stable to d/c.   Difficult to place patient No   Time spent: 35 minutes. I reviewed all nursing notes, pharmacy notes, vitals, pertinent old records. I have discussed plan of care as described above with RN.  Author: Lynden Oxford, MD [facility] 06/20/2021 7:10 PM  To reach On-call, see care teams to locate the attending and reach out via [email or web address]. Between 7PM-7AM, please contact night-coverage If you still have difficulty reaching the attending provider, please page the [facility] (Director on Call) for [facility] on amion for assistance.

## 2021-06-20 NOTE — Progress Notes (Signed)
Progress Note  6 Days Post-Op  Subjective: Denies any further hematochezia.  Some crampy abdominal pain from time to time.  Appetite is okay.  Reports that her oxygen levels dropped and she had to go on oxygen last night as well as having some low blood sugars.   Objective: Vital signs in last 24 hours: Temp:  [97.9 F (36.6 C)-99.3 F (37.4 C)] 98.5 F (36.9 C) (07/05 0400) Pulse Rate:  [81-108] 88 (07/05 0400) Resp:  [20] 20 (07/04 1808) BP: (96-113)/(71-79) 101/71 (07/05 0400) SpO2:  [93 %-99 %] 98 % (07/05 0400) Last BM Date: 06/19/21  Intake/Output from previous day: 07/04 0701 - 07/05 0700 In: 610 [P.O.:600; I.V.:10] Out: 375 [Drains:375] Intake/Output this shift: No intake/output data recorded.  PE: General: Pleasant, calm, comfortable appearing HEENT: head is normocephalic, atraumatic. Mouth is pink and moist Heart:  Palpable radial pulses bilaterally Lungs: Respiratory effort nonlabored Abd: soft, NT, ND, +BS, drain bag contains only scant clear fluid, there was 375 output recorded yesterday- ?  Recording error MS: all 4 extremities are symmetrical with no cyanosis, clubbing, or edema. Skin: warm and dry with no masses, lesions, or rashes Psych: A&Ox3 with an appropriate affect.   Lab Results:  Recent Labs    06/19/21 0358  WBC 9.0  HGB 8.3*  HCT 26.5*  PLT 185    BMET Recent Labs    06/19/21 0358  NA 137  K 3.5  CL 108  CO2 25  GLUCOSE 93  BUN <5*  CREATININE 0.91  CALCIUM 8.4*    PT/INR No results for input(s): LABPROT, INR in the last 72 hours. CMP     Component Value Date/Time   NA 137 06/19/2021 0358   NA 141 11/07/2020 1058   K 3.5 06/19/2021 0358   CL 108 06/19/2021 0358   CO2 25 06/19/2021 0358   GLUCOSE 93 06/19/2021 0358   BUN <5 (L) 06/19/2021 0358   BUN 13 11/07/2020 1058   CREATININE 0.91 06/19/2021 0358   CALCIUM 8.4 (L) 06/19/2021 0358   PROT 4.0 (L) 06/16/2021 0253   PROT 6.5 11/07/2020 1058   ALBUMIN 2.0 (L)  06/16/2021 0253   ALBUMIN 4.3 11/07/2020 1058   AST 18 06/16/2021 0253   ALT 18 06/16/2021 0253   ALKPHOS 46 06/16/2021 0253   BILITOT 0.7 06/16/2021 0253   BILITOT 0.6 11/07/2020 1058   GFRNONAA >60 06/19/2021 0358   GFRAA 61 11/07/2020 1058   Lipase     Component Value Date/Time   LIPASE 19 05/26/2021 1601       Studies/Results: No results found.  Anti-infectives: Anti-infectives (From admission, onward)    Start     Dose/Rate Route Frequency Ordered Stop   06/17/21 1000  levofloxacin (LEVAQUIN) tablet 750 mg        750 mg Oral Daily 06/17/21 0843     05/27/21 0500  piperacillin-tazobactam (ZOSYN) IVPB 3.375 g  Status:  Discontinued        3.375 g 12.5 mL/hr over 240 Minutes Intravenous Every 8 hours 05/26/21 2126 06/17/21 0843   05/26/21 2015  ceFEPIme (MAXIPIME) 2 g in sodium chloride 0.9 % 100 mL IVPB       See Hyperspace for full Linked Orders Report.   2 g 200 mL/hr over 30 Minutes Intravenous  Once 05/26/21 2004 05/26/21 2107   05/26/21 2015  metroNIDAZOLE (FLAGYL) IVPB 500 mg       See Hyperspace for full Linked Orders Report.   500 mg 100 mL/hr  over 60 Minutes Intravenous  Once 05/26/21 2004 05/26/21 2135        Assessment/Plan  34F Diverticulitis with intra-abdominal abscess (9x5 cm) - afebrile, WBC 9, is on chronic steroids, monitor -  culture positive for pseudomonas sensitive to zosyn - Repeat CT 6/27 with abscess nearly completely decompressed. - IR drain injection 6/29 - no fistula but cavity remains. Continue drain. - No emergent surgical needs at this juncture - ID involved managing abx - hoping to switch to oral soon pending emesis - OOB/mobilize with therapies  - tolerating regular diet  Hematemesis and bloody stool - CT 6/29 nondiagnostic for GI bleed due to malfunction - hgb 8.5 s/p 2 units 6/29 - EGD 6/29 without source of acute bleeding. Repeat CT abd if further bleeding episodes -Hemoglobin stable through the weekend and no further  hematochezia   FEN: Regular ID: Zosyn 6/10>> VTE: SCD's Foley: none, external cath Dispo: SDU   - Below per primary service - Bilateral PE-heparin IV held due to hgb requiring transfusion. S/p IVCF 6/16 by IR Melena - EGD 6/16 normal esophagus, erythematous mucosa in the antrum, duodenitis but no active bleeding. GI signed off. Acute respiratory failure with hypoxia Thombocytopenia Chronic diastolic heart failure CKD IIIa HTN Graves dz DM2 Morbid obesity     Okay for dispo to SNF from surgical standpoint.     LOS: 25 days    Berna Bue, MD [facility] Surgery 06/20/2021, 7:50 AM Please see Amion for pager number during day hours 7:00am-4:30pm

## 2021-06-20 NOTE — TOC Progression Note (Signed)
Transition of Care [facility]) - Progression Note    Patient Details  Name: [name] MRN: [id number] Date of Birth: [birth year]  [facility]) CM/SW Contact  Eduard Roux, [place] Phone Number: 06/20/2021, 6:01 PM  Clinical Narrative:     Patient's daughter,Whitney chose [facility] - CSW left voice message with admission coordinator to return call.  Antony Blackbird, MSW, LCSW Clinical Social Worker    Expected Discharge Plan: Skilled Nursing Facility Barriers to Discharge: SNF Pending bed offer  Expected Discharge Plan and Services Expected Discharge Plan: Skilled Nursing Facility     Post Acute Care Choice: Skilled Nursing Facility                                         Social Determinants of Health (SDOH) Interventions    Readmission Risk Interventions No flowsheet data found.

## 2021-06-20 NOTE — TOC Progression Note (Signed)
Transition of Care [facility]) - Progression Note    Patient Details  Name: [name] MRN: [id number] Date of Birth: [birth year]  Transition of Care [facility]) CM/SW Contact  Erin Sons, [place] Phone Number: 06/20/2021, 2:51 PM  Clinical Narrative:     CSW faxed referral to [facility] at [phone].   Expected Discharge Plan: Skilled Nursing Facility Barriers to Discharge: SNF Pending bed offer  Expected Discharge Plan and Services Expected Discharge Plan: Skilled Nursing Facility     Post Acute Care Choice: Skilled Nursing Facility                                         Social Determinants of Health (SDOH) Interventions    Readmission Risk Interventions No flowsheet data found.

## 2021-06-21 LAB — CBC
HCT: 27.1 % — ABNORMAL LOW (ref 36.0–46.0)
Hemoglobin: 8.5 g/dL — ABNORMAL LOW (ref 12.0–15.0)
MCH: 31.1 pg (ref 26.0–34.0)
MCHC: 31.4 g/dL (ref 30.0–36.0)
MCV: 99.3 fL (ref 80.0–100.0)
Platelets: 201 10*3/uL (ref 150–400)
RBC: 2.73 MIL/uL — ABNORMAL LOW (ref 3.87–5.11)
RDW: 19.1 % — ABNORMAL HIGH (ref 11.5–15.5)
WBC: 10 10*3/uL (ref 4.0–10.5)
nRBC: 0 % (ref 0.0–0.2)

## 2021-06-21 LAB — BASIC METABOLIC PANEL
Anion gap: 8 (ref 5–15)
BUN: 5 mg/dL — ABNORMAL LOW (ref 8–23)
CO2: 22 mmol/L (ref 22–32)
Calcium: 8.6 mg/dL — ABNORMAL LOW (ref 8.9–10.3)
Chloride: 108 mmol/L (ref 98–111)
Creatinine, Ser: 1.01 mg/dL — ABNORMAL HIGH (ref 0.44–1.00)
GFR, Estimated: 60 mL/min — ABNORMAL LOW (ref >60)
Glucose, Bld: 85 mg/dL (ref 70–99)
Potassium: 4.1 mmol/L (ref 3.5–5.1)
Sodium: 138 mmol/L (ref 135–145)

## 2021-06-21 LAB — GLUCOSE, CAPILLARY
Glucose-Capillary: 97 mg/dL (ref 70–99)
Glucose-Capillary: 129 mg/dL — ABNORMAL HIGH (ref 70–99)
Glucose-Capillary: 191 mg/dL — ABNORMAL HIGH (ref 70–99)
Glucose-Capillary: 142 mg/dL — ABNORMAL HIGH (ref 70–99)

## 2021-06-21 LAB — MAGNESIUM: Magnesium: 1.8 mg/dL (ref 1.7–2.4)

## 2021-06-21 NOTE — Plan of Care (Signed)
  Problem: Education: Goal: Knowledge of General Education information will improve Description Including pain rating scale, medication(s)/side effects and non-pharmacologic comfort measures Outcome: Progressing   Problem: Health Behavior/Discharge Planning: Goal: Ability to manage health-related needs will improve Outcome: Progressing   

## 2021-06-21 NOTE — Progress Notes (Signed)
Nutrition Follow-up  DOCUMENTATION CODES:   Obesity unspecified  INTERVENTION:   - Continue Ensure Enlive po BID, each supplement provides 350 kcal and 20 grams of protein   - Pt with Glucerna Shakes from home in room and can consume these as desired, each supplement provides 220 kcal and 10 grams of protein   - Encourage PO intake  NUTRITION DIAGNOSIS:   Inadequate oral intake related to altered GI function, acute illness as evidenced by NPO status.  Progressing, pt now on a Regular diet  GOAL:   Patient will meet greater than or equal to 90% of their needs  Progressing  MONITOR:   PO intake, Supplement acceptance, Diet advancement, Labs, Weight trends, I & O's  REASON FOR ASSESSMENT:   Consult New TPN/TNA  ASSESSMENT:   70 year old female admitted with abdominal pain, N/V/D x 2 weeks with perforated sigmoid diverticulitis with abscess, also with PE. PMH includes CHF, HTN, DM, celiac disease.  6/12 - clear liquids 6/13 - full liquids 6/15 - NPO 6/16 - s/p EGD without evidence of active bleeding, s/p IVC filter placement by IR, PICC placed, TPN started @ 35 ml/hr 6/17 - clear liquids 6/20 - TPN advanced to 45 ml/hr 6/21 - NPO, s/p IR aspiration of intra-abdominal abscess and drain placement, TPN advanced to 65 ml/hr, clear liquids, later full liquids 6/22 - TPN weaned to 45 ml/hr, GI soft diet 6/23 - TPN d/c 6/28 - hematemesis, NPO 6/29 - s/p pelvic abscessogram showing no sign of fistula, s/p EGD with no source of active bleeding, clear liquids 6/30 - full liquids 7/01 - soft diet 7/03 - regular diet  Noted plan is for pt to d/c to SNF once bed is available.  Spoke with pt at bedside. Pt in good spirits and reports that her appetite is improving. Pt states that she has been eating a lot of fruit but needs to focus on eating more protein. Discussed protein food options with pt. Pt willing to try drinking Ensure or Glucerna again to aid in meeting kcal and  protein needs. Pt concerned about supplement causing diarrhea but realizes that she has still been having diarrhea even though she has not been drinking supplements. RD provided pt with an Ensure at time of visit; pt states that she will drink it later once it has warmed to room temperature. Pt also reports family has brought in some restaurant food. RD encouraged pt to consume whatever foods sound appealing with a focus on high-protein foods.  Admit weight: 109.1 kg 6/27 weight: 106.2 kg  No new weights since 6/27.  Meal Completion: 100% x last 3 documented meals  Medications reviewed and include: Ensure Enlive BID, SSI, remeron, protonix, prednisone, florastor, simethicone  Labs reviewed: creatinine 1.01, hemoglobin 8.5 CBG's: 85-144 x 24 hours  UOP: 700 ml x 12 hours Abdominal drain: 30 ml x 12 hours I/O's: +1.4 L since admit  Diet Order:   Diet Order             Diet regular Room service appropriate? Yes; Fluid consistency: Thin  Diet effective now                   EDUCATION NEEDS:   Education needs have been addressed  Skin:  Skin Assessment: Skin Integrity Issues: Other: MASD to perineal region  Last BM:  06/20/21 rectal tube  Height:   Ht Readings from Last 1 Encounters:  06/12/21 5\' 2"  (1.575 m)    Weight:   Wt Readings from  Last 1 Encounters:  06/12/21 106.2 kg    BMI:  Body mass index is 42.82 kg/m.  Estimated Nutritional Needs:   Kcal:  2000-2200 kcals  Protein:  110-120 grams  Fluid:  >/= 2 L    Mertie Clause, MS, RD, LDN Inpatient Clinical Dietitian Please see AMiON for contact information.

## 2021-06-21 NOTE — Progress Notes (Signed)
Progress Note  7 Days Post-Op  Subjective: No complaints today   Objective: Vital signs in last 24 hours: Temp:  [97.8 F (36.6 C)-98.6 F (37 C)] 97.8 F (36.6 C) (07/06 0824) Pulse Rate:  [75-100] 80 (07/06 0824) Resp:  [12-20] 15 (07/06 0824) BP: (90-139)/(66-87) 110/73 (07/06 0824) SpO2:  [93 %-100 %] 98 % (07/06 0824) Last BM Date: 06/20/21  Intake/Output from previous day: 07/05 0701 - 07/06 0700 In: 665 [P.O.:600; I.V.:10; IV Piggyback:50] Out: 730 [Urine:700; Drains:30] Intake/Output this shift: No intake/output data recorded.  PE: General: Pleasant, calm, comfortable appearing HEENT: head is normocephalic, atraumatic. Mouth is pink and moist Heart:  Palpable radial pulses bilaterally Lungs: Respiratory effort nonlabored Abd: soft, NT, ND, +BS, drain output ss, 30cc recored MS: all 4 extremities are symmetrical with no cyanosis, clubbing, or edema. Skin: warm and dry with no masses, lesions, or rashes Psych: A&Ox3 with an appropriate affect.   Lab Results:  Recent Labs    06/19/21 0358 06/21/21 0603  WBC 9.0 10.0  HGB 8.3* 8.5*  HCT 26.5* 27.1*  PLT 185 201    BMET Recent Labs    06/19/21 0358 06/21/21 0445  NA 137 138  K 3.5 4.1  CL 108 108  CO2 25 22  GLUCOSE 93 85  BUN <5* 5*  CREATININE 0.91 1.01*  CALCIUM 8.4* 8.6*    PT/INR No results for input(s): LABPROT, INR in the last 72 hours. CMP     Component Value Date/Time   NA 138 06/21/2021 0445   NA 141 11/07/2020 1058   K 4.1 06/21/2021 0445   CL 108 06/21/2021 0445   CO2 22 06/21/2021 0445   GLUCOSE 85 06/21/2021 0445   BUN 5 (L) 06/21/2021 0445   BUN 13 11/07/2020 1058   CREATININE 1.01 (H) 06/21/2021 0445   CALCIUM 8.6 (L) 06/21/2021 0445   PROT 4.0 (L) 06/16/2021 0253   PROT 6.5 11/07/2020 1058   ALBUMIN 2.0 (L) 06/16/2021 0253   ALBUMIN 4.3 11/07/2020 1058   AST 18 06/16/2021 0253   ALT 18 06/16/2021 0253   ALKPHOS 46 06/16/2021 0253   BILITOT 0.7 06/16/2021 0253    BILITOT 0.6 11/07/2020 1058   GFRNONAA 60 (L) 06/21/2021 0445   GFRAA 61 11/07/2020 1058   Lipase     Component Value Date/Time   LIPASE 19 05/26/2021 1601       Studies/Results: No results found.  Anti-infectives: Anti-infectives (From admission, onward)    Start     Dose/Rate Route Frequency Ordered Stop   06/17/21 1000  levofloxacin (LEVAQUIN) tablet 750 mg        750 mg Oral Daily 06/17/21 0843     05/27/21 0500  piperacillin-tazobactam (ZOSYN) IVPB 3.375 g  Status:  Discontinued        3.375 g 12.5 mL/hr over 240 Minutes Intravenous Every 8 hours 05/26/21 2126 06/17/21 0843   05/26/21 2015  ceFEPIme (MAXIPIME) 2 g in sodium chloride 0.9 % 100 mL IVPB       See Hyperspace for full Linked Orders Report.   2 g 200 mL/hr over 30 Minutes Intravenous  Once 05/26/21 2004 05/26/21 2107   05/26/21 2015  metroNIDAZOLE (FLAGYL) IVPB 500 mg       See Hyperspace for full Linked Orders Report.   500 mg 100 mL/hr over 60 Minutes Intravenous  Once 05/26/21 2004 05/26/21 2135        Assessment/Plan  39F Diverticulitis with intra-abdominal abscess (9x5 cm) - afebrile, WBC  9, is on chronic steroids, monitor - culture positive for pseudomonas sensitive to zosyn - Repeat CT 6/27 with abscess nearly completely decompressed. - IR drain injection 6/29 - no fistula but cavity remains. Continue drain. - No emergent surgical needs at this juncture - ID involved managing abx - hoping to switch to oral soon pending emesis - OOB/mobilize with therapies  - tolerating regular diet  Hematemesis and bloody stool - CT 6/29 nondiagnostic for GI bleed due to malfunction - hgb 8.5 s/p 2 units 6/29 - EGD 6/29 without source of acute bleeding. Repeat CT abd if further bleeding episodes -Hemoglobin stable and no further hematochezia   FEN: Regular ID: Zosyn 6/10>> VTE: SCD's Foley: none, external cath Dispo: SDU   - Below per primary service - Bilateral PE-heparin IV held due to hgb  requiring transfusion. S/p IVCF 6/16 by IR Melena - EGD 6/16 normal esophagus, erythematous mucosa in the antrum, duodenitis but no active bleeding. GI signed off. Acute respiratory failure with hypoxia Thombocytopenia Chronic diastolic heart failure CKD IIIa HTN Graves dz DM2 Morbid obesity     Okay for dispo to SNF from surgical standpoint. Surgery will sign off at this point- please call if we can be helpful     LOS: 26 days    Berna Bue, MD [facility] 06/21/2021, 8:28 AM Please see Amion for pager number during day hours 7:00am-4:30pm

## 2021-06-21 NOTE — TOC Progression Note (Signed)
Transition of Care [facility]) - Progression Note    Patient Details  Name: [name] MRN: [id number] Date of Birth: 09-29-1951  [facility]) CM/SW Contact  Mearl Latin, LCSW Phone Number: 06/21/2021, 9:25 AM  Clinical Narrative:    CSW left message with [employer] to ensure they are starting insurance authorization for patient.    Expected Discharge Plan: Skilled Nursing Facility Barriers to Discharge: SNF Pending bed offer  Expected Discharge Plan and Services Expected Discharge Plan: Skilled Nursing Facility     Post Acute Care Choice: Skilled Nursing Facility                                         Social Determinants of Health (SDOH) Interventions    Readmission Risk Interventions No flowsheet data found.

## 2021-06-21 NOTE — Progress Notes (Signed)
[facility] Progress Note  Patient: [name]    NLG:[id number]  DOA: 05/26/2021     Date of Service: the patient was seen and examined on 06/21/2021  Brief hospital course: Past medical history of HFpEF, HTN, OSA, T2DM, obesity, Graves' disease on prednisone taper.  Presents with complaints of abdominal pain. Found to have perforated diverticulitis and abscess.  Had intermittent melena as well. EGD on 6/16 and 6/29.  No significant evidence of bleeding seen.  Currently plan is to proceed with discharge to SNF once bed is available  Subjective: No acute complaint.  No nausea no vomiting.  No fever no chills.  No bleeding.  Assessment and Plan: 1.  Acute blood loss anemia Upper GI bleed Hemoglobin baseline around 12. EGD 6/16 normal esophagus duodenitis. EGD 6/29 normal esophagus erythematous duodenopathy as well as enteropathy. Hematin in gastric body. Not a candidate for colonoscopy due to diverticulitis. Not a candidate for capsule endoscopy given per GI as well. Per GI recommended continue twice daily PPI. Tolerating regular diet. Transfuse for hemoglobin less than 7 or hemodynamic instability.  2.  Perforated diverticulitis with an abscess. Initial attempt for drain placement were not successful. Repeat CT scan on 6/18 shows increasing abscess size. On 6/21 IR placed a drain. Cultures growing pansensitive Pseudomonas.  ID consulted. Treated with IV Zosyn.  Transition to p.o. Levaquin 750 mg daily. Continue for 10 days of antibiotic after removal of the IR drain. No indication for ID follow-up. Continue pain control. Tolerating regular diet. Outpatient colonoscopy once stable.   3.  Acute hypoxic respiratory failure, present on admission secondary to bilateral PE with RV strain. Acute thrombocytopenia-secondary to consumption in clot, resolved PE seen on initial CT scan on admission. Patient was started on IV heparin. PCCM was notified as the patient had RV  strain on echocardiogram.  Recommended to continue anticoagulation and outpatient follow-up with pulmonology Dr. Judeth Horn. Due to GI bleed anticoagulation was discontinued. IVC filter placed on 6/16 DVT prophylaxis was initiated after stabilization of GI bleed with Lovenox on 6/23.   Discontinue on 6/28 again due to GI bleed Oxygenation currently stable on room air. Not a candidate anticoagulation due to ongoing acute GI bleed.   4.  Type 2 diabetes mellitus, uncontrolled with hyper and hypoglycemia without any complication Continue sliding scale insulin.   5.  Hypothyroidism, Graves' disease, exophthalmos. On steroids at home as well as levothyroxine. Continue levothyroxine. Per endocrine patient was started on prednisone taper. In the hospital patient is on Solu-Medrol.  Now on p.o. prednisone with taper off with 5 mg daily for 5 days and then stop.   6.  HFpEF No evidence of volume overload. Monitor.   7.  CKD 3A. Renal function stable. Monitor.   8.  Morbid obesity. Body mass index is 42.82 kg/m.  Placing the patient at high risk poor outcome. May have undiagnosed sleep apnea as well.  Will benefit from diagnostic evaluation outpatient.   10.  Suspected depression.  Poor p.o. intake/anorexia Continue Remeron.  Tolerating well. Patient can have food from home.   11.  Hypotensive episode.  6/29 resolved Before EGD blood pressure dropped significantly to 70s. Patient was: 1 L normal saline bolus as well as 1 PRBC transfusion. Blood pressure improved after this.  PICC line removed on 7/2.  Scheduled Meds:  feeding supplement  237 mL Oral BID BM   insulin aspart  0-5 Units Subcutaneous QHS   insulin aspart  0-9 Units Subcutaneous TID WC  levofloxacin  750 mg Oral Daily   levothyroxine  112 mcg Oral Q0600   mirtazapine  7.5 mg Oral QHS   pantoprazole  40 mg Oral BID AC   predniSONE  5 mg Oral Q breakfast   saccharomyces boulardii  250 mg Oral BID   simethicone  20  mg Oral QID   sodium chloride flush  5 mL Intracatheter Q8H   Continuous Infusions:  PRN Meds: acetaminophen, alum & mag hydroxide-simeth, Gerhardt's butt cream, liver oil-zinc oxide, morphine injection, ondansetron (ZOFRAN) IV  Body mass index is 42.82 kg/m.  Nutrition Problem: Inadequate oral intake Etiology: altered GI function, acute illness     DVT Prophylaxis: SCD, pharmacological prophylaxis contraindicated due to GI bleed     Advance goals of care discussion: Pt is Full code.  Family Communication: no family was present at bedside, at the time of interview.   Data Reviewed: I have personally reviewed and interpreted daily labs, tele strips, imaging. Electrolytes stable.  Serum creatinine stable.  Hemoglobin stable 8.5.  Physical Exam:  General: Appear in mild distress, no Rash; Oral Mucosa Clear, moist. no Abnormal Neck Mass Or lumps, Conjunctiva normal  Cardiovascular: S1 and S2 Present, no Murmur, Respiratory: good respiratory effort, Bilateral Air entry present and CTA, no Crackles, no wheezes Abdomen: Bowel Sound present, Soft and no tenderness Extremities: no Pedal edema Neurology: alert and oriented to time, place, and person affect appropriate. no new focal deficit Gait not checked due to patient safety concerns  Vitals:   06/21/21 0301 06/21/21 0824 06/21/21 1138 06/21/21 1551  BP: 139/77 110/73 103/75 106/76  Pulse: 81 80 99 99  Resp: 12 15 20 20   Temp: 98.2 F (36.8 C) 97.8 F (36.6 C) 98.1 F (36.7 C) 98.7 F (37.1 C)  TempSrc:  Oral Oral Oral  SpO2: 98% 98% 100% 99%  Weight:      Height:        Disposition:  Status is: Inpatient  Remains inpatient appropriate because:Inpatient level of care appropriate due to severity of illness  Dispo: The patient is from: Home              Anticipated d/c is to: SNF              Patient currently is medically stable to d/c.   Difficult to place patient No   Time spent: 35 minutes. I reviewed all  nursing notes, pharmacy notes, vitals, pertinent old records. I have discussed plan of care as described above with RN.  Author: , MD [facility] 06/21/2021 6:16 PM  To reach On-call, see care teams to locate the attending and reach out via [email or web address]. Between 7PM-7AM, please contact night-coverage If you still have difficulty reaching the attending provider, please page the [facility] (Director on Call) for [facility] on amion for assistance.

## 2021-06-22 LAB — GLUCOSE, CAPILLARY
Glucose-Capillary: 90 mg/dL (ref 70–99)
Glucose-Capillary: 175 mg/dL — ABNORMAL HIGH (ref 70–99)
Glucose-Capillary: 230 mg/dL — ABNORMAL HIGH (ref 70–99)
Glucose-Capillary: 127 mg/dL — ABNORMAL HIGH (ref 70–99)

## 2021-06-22 NOTE — Progress Notes (Signed)
Occupational Therapy Treatment Patient Details Name: [name] MRN: [id number] DOB: 29-Jan-1951 Today's Date: 06/22/2021    History of present illness 70 y.o. female admitted on 05/26/21 for sigmoid diverticulitis with perforation and abscess, IR consulted for surgery vs drain placement and had an unsucessful attempt on 05/28/21.  Bil PE started on IV heparin. S/p CT guided placement of a 10 Fr drainage catheter placement into the diverticular abscess within the midline of the lower abdomen/upper pelvis 6/21. S/p pelvic abscessogram 6/29. PMH including anemia, CHF, DM, graves disease, HTN, obesity, stroke, seizure, and thyroid disease.   OT comments  Pt making steady progress toward goals. Able to transfer to [facility] with min A and participate in ADL tasks. HR in 140s. Encourage nsg to use BSC rather than bedpan to increase mobility. Continue to recommend rehab at [facility].   Follow Up Recommendations  SNF    Equipment Recommendations  Other (comment) (TBA)    Recommendations for Other Services      Precautions / Restrictions Precautions Precautions: Fall Precaution Comments: R gravity drain       Mobility Bed Mobility               General bed mobility comments: OOB in chair    Transfers Overall transfer level: Needs assistance Equipment used: Rolling walker (2 wheeled) Transfers: Sit to/from Stand Sit to Stand: Min guard              Balance Overall balance assessment: Needs assistance   Sitting balance-Leahy Scale: Good       Standing balance-Leahy Scale: Poor                             ADL either performed or assessed with clinical judgement   ADL Overall ADL's : Needs assistance/impaired     Grooming: Set up;Sitting   Upper Body Bathing: Set up;Sitting   Lower Body Bathing: Moderate assistance;Sit to/from stand   Upper Body Dressing : Minimal assistance;Sitting   Lower Body Dressing: Moderate assistance;Sit to/from stand   Toilet  Transfer: Minimal assistance;RW;Stand-pivot   Toileting- Clothing Manipulation and Hygiene: Moderate assistance       Functional mobility during ADLs: Minimal assistance;Rolling walker;Cueing for safety       Vision       Perception     Praxis      Cognition Arousal/Alertness: Awake/alert Behavior During Therapy: WFL for tasks assessed/performed Overall Cognitive Status: Impaired/Different from baseline Area of Impairment: Memory;Attention;Safety/judgement;Awareness                 Orientation Level: Disoriented to;Situation Current Attention Level: Selective Memory: Decreased short-term memory Following Commands: Follows one step commands consistently Safety/Judgement: Decreased awareness of safety;Decreased awareness of deficits Awareness: Emergent Problem Solving: Slow processing General Comments: Was unaware she had a drain adn states she was here becuase she thought she had Multiple Sclerosis"        Exercises Other Exercises Other Exercises: sit - stand x 4   Shoulder Instructions       General Comments      Pertinent Vitals/ Pain       Pain Assessment: Faces Faces Pain Scale: Hurts a little bit Pain Location: abdomen Pain Descriptors / Indicators: Discomfort Pain Intervention(s): Limited activity within patient's tolerance  Home Living  Prior Functioning/Environment              Frequency  Min 2X/week        Progress Toward Goals  OT Goals(current goals can now be found in the care plan section)  Progress towards OT goals: Progressing toward goals  Acute Rehab OT Goals Patient Stated Goal: to improve OT Goal Formulation: With patient Time For Goal Achievement: 06/30/21 Potential to Achieve Goals: Good ADL Goals Pt Will Perform Grooming: with modified independence Pt Will Perform Upper Body Bathing: with modified independence Pt Will Transfer to Toilet: with modified  independence Additional ADL Goal #1: Pt will perform bed mobility with Supervision in preparation for ADLs Additional ADL Goal #2: met  Plan Discharge plan needs to be updated;Frequency remains appropriate    Co-evaluation                 AM-PAC OT "6 Clicks" Daily Activity     Outcome Measure   Help from another person eating meals?: A Little Help from another person taking care of personal grooming?: A Little Help from another person toileting, which includes using toliet, bedpan, or urinal?: A Lot Help from another person bathing (including washing, rinsing, drying)?: A Lot Help from another person to put on and taking off regular upper body clothing?: A Little Help from another person to put on and taking off regular lower body clothing?: A Lot 6 Click Score: 15    End of Session Equipment Utilized During Treatment: Gait belt;Rolling walker  OT Visit Diagnosis: Unsteadiness on feet (R26.81);Other abnormalities of gait and mobility (R26.89);Muscle weakness (generalized) (M62.81);Pain;Other symptoms and signs involving cognitive function Pain - part of body:  (abdomen)   Activity Tolerance Patient tolerated treatment well   Patient Left in chair;with call bell/phone within reach;with chair alarm set   Nurse Communication Mobility status;Other (comment) (encourage use of BSC)        Time: 1340-1409 OT Time Calculation (min): 29 min  Charges:    Maurie Boettcher, OT/L   Acute OT Clinical Specialist Acute Rehabilitation Services Pager [phone] Office [phone]    [facility] 06/22/2021, 3:24 PM

## 2021-06-22 NOTE — TOC Progression Note (Signed)
Transition of Care [facility]) - Progression Note    Patient Details  Name: [name] MRN: [id number] Date of Birth: [birth year]  Transition of Care [facility]) CM/SW Contact  Eduard Roux, [place] Phone Number: 06/22/2021, 4:38 PM  Clinical Narrative:     Insurance still pending   Antony Blackbird, MSW, LCSW Clinical Social Worker    Expected Discharge Plan: Skilled Nursing Facility Barriers to Discharge: SNF Pending bed offer  Expected Discharge Plan and Services Expected Discharge Plan: Skilled Nursing Facility     Post Acute Care Choice: Skilled Nursing Facility                                         Social Determinants of Health (SDOH) Interventions    Readmission Risk Interventions No flowsheet data found.

## 2021-06-22 NOTE — Consult Note (Signed)
   [facility] Inpatient Consult   06/22/2021  Norlene Lanes [birth year] [id number]  [facility] [[facility]]  Accountable Care Organization [ACO] Patient: Monia Pouch Medicare   Follow up:  Review inpatient TOC/disposition  Reviewed for [facility] and patient's disposition is awaiting on authorization for SNF.  Charlesetta Shanks, RN BSN CCM [facility]  [phone] business mobile phone Toll free office [phone]  Fax number: [phone] [email or web address]

## 2021-06-22 NOTE — Progress Notes (Signed)
[facility] Progress Note  Patient: [name]    AUQ:[id number]  DOA: 05/26/2021     Date of Service: the patient was seen and examined on 06/22/2021  Brief hospital course: Past medical history of HFpEF, HTN, OSA, T2DM, obesity, Graves' disease on prednisone taper.  Presents with complaints of abdominal pain. Found to have perforated diverticulitis and abscess.   6/10 admitted, surgery consulted.  Also started on IV heparin for PE. 6/12 CT-guided drainage attempted 6/13 ID consulted 6/15 GI consulted for GI bleed 6/16 EGD shows no acute bleeding.  IVC filter placed. 6/29 repeat EGD again shows no acute bleeding.  Currently plan is to proceed with discharge to SNF once bed is available  Subjective: No acute complaint.  No nausea no vomiting but no fever no chills.  Had 2 BMs today.  Assessment and Plan: 1.  Acute blood loss anemia Upper GI bleed Hemoglobin baseline around 12.  On presentation hemoglobin was 11.7. Developed GI bleed during the hospital stay after initiation of heparin. EGD 6/16 normal esophagus duodenitis. EGD 6/29 normal esophagus erythematous duodenopathy as well as enteropathy. Hematin in gastric body. Not a candidate for colonoscopy due to diverticulitis. Not a candidate for capsule endoscopy given per GI as well. Per GI recommended continue twice daily PPI. Tolerating regular diet. Hemoglobin stable.  Transfuse for hemoglobin less than 7 or hemodynamic instability.  2.  Perforated diverticulitis with an abscess. 6/12 initial attempt for drain placement were not successful. Repeat CT scan on 6/18 shows increasing abscess size. On 6/21 IR placed a drain. Cultures growing pansensitive Pseudomonas.  ID consulted. Treated with IV Zosyn.  Transition to p.o. Levaquin 750 mg daily. Continue for 10 days of antibiotic after removal of the IR drain. No indication for ID follow-up. Continue pain control. Tolerating regular diet. Outpatient colonoscopy  once stable.   3.  Acute hypoxic respiratory failure, present on admission secondary to bilateral PE with RV strain. Acute thrombocytopenia-secondary to consumption in clot, resolved PE seen on initial CT scan on admission. Patient was started on IV heparin. PCCM was notified as the patient had RV strain on echocardiogram.  Recommended to continue anticoagulation and outpatient follow-up with pulmonology Dr. Judeth Horn. Due to GI bleed anticoagulation was discontinued. IVC filter placed on 6/16 DVT prophylaxis was initiated after stabilization of GI bleed with Lovenox on 6/23.   Discontinue on 6/28 again due to GI bleed Oxygenation currently stable on room air. Not a candidate anticoagulation due to ongoing acute GI bleed.   4.  Type 2 diabetes mellitus, uncontrolled with hyper and hypoglycemia without any complication Continue sliding scale insulin.   5.  Hypothyroidism, Graves' disease, exophthalmos. On steroids at home as well as levothyroxine. Continue levothyroxine. Per endocrine patient was started on prednisone taper. In the hospital patient is on Solu-Medrol.  Completed a prednisone taper.   6.  HFpEF No evidence of volume overload. Monitor.   7.  CKD 3A. Renal function stable. Monitor.   8.  Morbid obesity. Body mass index is 42.82 kg/m.  Placing the patient at high risk poor outcome. May have undiagnosed sleep apnea as well.  Will benefit from diagnostic evaluation outpatient.   10.  Suspected depression.  Poor p.o. intake/anorexia Continue Remeron.  Tolerating well. Patient can have food from home.   11.  Hypotensive episode.  6/29 resolved Before EGD blood pressure dropped significantly to 70s. Patient was: 1 L normal saline bolus as well as 1 PRBC transfusion. Blood pressure improved after this.  PICC  line removed on 7/2.  Scheduled Meds:  feeding supplement  237 mL Oral BID BM   insulin aspart  0-5 Units Subcutaneous QHS   insulin aspart  0-9 Units  Subcutaneous TID WC   levofloxacin  750 mg Oral Daily   levothyroxine  112 mcg Oral Q0600   mirtazapine  7.5 mg Oral QHS   pantoprazole  40 mg Oral BID AC   saccharomyces boulardii  250 mg Oral BID   simethicone  20 mg Oral QID   sodium chloride flush  5 mL Intracatheter Q8H   Continuous Infusions:  PRN Meds: acetaminophen, alum & mag hydroxide-simeth, Gerhardt's butt cream, liver oil-zinc oxide, morphine injection, ondansetron (ZOFRAN) IV  Body mass index is 42.82 kg/m.  Nutrition Problem: Inadequate oral intake Etiology: altered GI function, acute illness     DVT Prophylaxis: SCD, pharmacological prophylaxis contraindicated due to GI bleed     Advance goals of care discussion: Pt is Full code.  Family Communication: no family was present at bedside, at the time of interview.   Data Reviewed: I have personally reviewed and interpreted daily labs, tele strips, imaging. Telemetry remained stable without any acute events.  Physical Exam:  General: Appear in mild distress, no Rash; Oral Mucosa Clear, moist. no Abnormal Neck Mass Or lumps, Conjunctiva normal  Cardiovascular: S1 and S2 Present, no Murmur, Respiratory: good respiratory effort, Bilateral Air entry present and CTA, no Crackles, no wheezes Abdomen: Bowel Sound present, Soft and no tenderness Extremities: no Pedal edema Neurology: alert and oriented to time, place, and person affect appropriate. no new focal deficit Gait not checked due to patient safety concerns  Vitals:   06/22/21 0731 06/22/21 1200 06/22/21 1444 06/22/21 1514  BP: 110/87 106/80 92/70 (!) 85/71  Pulse: 89 100 (!) 101   Resp: 20 20 15 15   Temp: 98.4 F (36.9 C) 98 F (36.7 C) 97.6 F (36.4 C)   TempSrc: Oral Oral Oral   SpO2: 97% 97% 96%   Weight:      Height:        Disposition:  Status is: Inpatient  Remains inpatient appropriate because:Inpatient level of care appropriate due to severity of illness  Dispo: The patient is from:  Home              Anticipated d/c is to: SNF              Patient currently is medically stable to d/c.   Difficult to place patient No   Time spent: 35 minutes. I reviewed all nursing notes, pharmacy notes, vitals, pertinent old records. I have discussed plan of care as described above with RN.  Author: , MD [facility] 06/22/2021 7:08 PM  To reach On-call, see care teams to locate the attending and reach out via [email or web address]. Between 7PM-7AM, please contact night-coverage If you still have difficulty reaching the attending provider, please page the [facility] (Director on Call) for [facility] on amion for assistance.

## 2021-06-22 NOTE — Progress Notes (Signed)
Physical Therapy Treatment Patient Details Name: [name] MRN: [id number] DOB: February 26, 1951 Today's Date: 06/22/2021    History of Present Illness 70 y.o. female admitted on 05/26/21 for sigmoid diverticulitis with perforation and abscess, IR consulted for surgery vs drain placement and had an unsucessful attempt on 05/28/21.  Bil PE started on IV heparin. S/p CT guided placement of a 10 Fr drainage catheter placement into the diverticular abscess within the midline of the lower abdomen/upper pelvis 6/21. S/p pelvic abscessogram 6/29. PMH including anemia, CHF, DM, graves disease, HTN, obesity, stroke, seizure, and thyroid disease.    PT Comments    The pt remains eager to mobilize with PT, was able to complete 3 short bouts of ambulation in the room with minG to minA, use of RW, cues for PLB, and seated rest in between. The pt continues to have HR elevation to 150-160s with mobility, requiring seated rest to recover. The pt also completed sit-stand from various heights, needing increased assist from low surfaces and max cues to increase power/activation in BLE. The pt will continue to benefit from maximal mobility with staff to improve activity tolerance and endurance fr activity.    Follow Up Recommendations  SNF     Equipment Recommendations  Rolling walker with 5" wheels;Wheelchair (measurements PT);Wheelchair cushion (measurements PT);Hospital bed    Recommendations for Other Services       Precautions / Restrictions Precautions Precautions: Fall Precaution Comments: R gravity drain, HR elevation with OOB mobility Restrictions Weight Bearing Restrictions: No    Mobility  Bed Mobility Overal bed mobility: Needs Assistance             General bed mobility comments: OOB in chair    Transfers Overall transfer level: Needs assistance Equipment used: Rolling walker (2 wheeled) Transfers: Sit to/from Stand Sit to Stand: Min guard;From elevated surface;Min assist          General transfer comment: minG to power up from elevated surface x8, but able to complete with minA from lower surface (recliner) x5  Ambulation/Gait Ambulation/Gait assistance: Min assist Gait Distance (Feet): 25 Feet (+ 15 ft + 15 ft) Assistive device: Rolling walker (2 wheeled) Gait Pattern/deviations: Decreased stride length;Trunk flexed;Step-through pattern;Shuffle Gait velocity: decreased Gait velocity interpretation: <1.31 ft/sec, indicative of household ambulator General Gait Details: Pt continues to experience steady increase of HR with mobility, from 110s at rest to high of 167bpm. HR with quick return to 120s with seated rest. SpO2 with inconsistent pleth during mobility, low of 90 with steady pleth once returned to sitting. Pt with increased dyspnea, RR to 33      Balance Overall balance assessment: Needs assistance Sitting-balance support: No upper extremity supported;Feet supported Sitting balance-Leahy Scale: Good     Standing balance support: Bilateral upper extremity supported;During functional activity Standing balance-Leahy Scale: Poor Standing balance comment: reliant on support in standing.                            Cognition Arousal/Alertness: Awake/alert Behavior During Therapy: WFL for tasks assessed/performed Overall Cognitive Status: Impaired/Different from baseline Area of Impairment: Memory;Attention;Safety/judgement;Awareness                 Orientation Level: Disoriented to;Situation Current Attention Level: Selective Memory: Decreased short-term memory Following Commands: Follows one step commands consistently Safety/Judgement: Decreased awareness of safety;Decreased awareness of deficits Awareness: Emergent Problem Solving: Slow processing General Comments: pt with improved orientation this PM, highly motivated but continues to benefit  from cues/assist for technique with mobility, even with tasks performed multiple times  in a session      Exercises Other Exercises Other Exercises: sit-stand from low surface x5; from elevated surface x8 Other Exercises: PLB with decrease in HR and slowed RR    General Comments General comments (skin integrity, edema, etc.): SpO2 stable on RA, HR increased to 167 bpm with activity, recovers quickly to 110s with seated rest. RR to 33, pt educated in slowed breathing and RR returned to 12-15      Pertinent Vitals/Pain Pain Assessment: Faces Faces Pain Scale: Hurts a little bit Pain Location: abdomen Pain Descriptors / Indicators: Discomfort Pain Intervention(s): Limited activity within patient's tolerance;Monitored during session;Repositioned     PT Goals (current goals can now be found in the care plan section) Acute Rehab PT Goals Patient Stated Goal: to improve PT Goal Formulation: With patient Time For Goal Achievement: 06/27/21 Potential to Achieve Goals: Good Progress towards PT goals: Progressing toward goals    Frequency    Min 3X/week      PT Plan Current plan remains appropriate    Co-evaluation              AM-PAC PT "6 Clicks" Mobility   Outcome Measure  Help needed turning from your back to your side while in a flat bed without using bedrails?: A Little Help needed moving from lying on your back to sitting on the side of a flat bed without using bedrails?: A Little Help needed moving to and from a bed to a chair (including a wheelchair)?: A Little Help needed standing up from a chair using your arms (e.g., wheelchair or bedside chair)?: A Little Help needed to walk in hospital room?: A Little Help needed climbing 3-5 steps with a railing? : Total 6 Click Score: 16    End of Session Equipment Utilized During Treatment: Gait belt Activity Tolerance: Patient tolerated treatment well Patient left: with call bell/phone within reach;in chair;with chair alarm set Nurse Communication: Mobility status;Other (comment) (HR elevation) PT  Visit Diagnosis: Muscle weakness (generalized) (M62.81);Difficulty in walking, not elsewhere classified (R26.2);Unsteadiness on feet (R26.81);Other abnormalities of gait and mobility (R26.89)     Time: 4166-0630 PT Time Calculation (min) (ACUTE ONLY): 48 min  Charges:  $Gait Training: 8-22 mins $Therapeutic Exercise: 23-37 mins                     [name], PT, DPT   Acute Rehabilitation Department Pager #: [phone]   [name] 06/22/2021, 5:55 PM

## 2021-06-23 DIAGNOSIS — G9341 Metabolic encephalopathy: Secondary | ICD-10-CM | POA: Diagnosis not present

## 2021-06-23 DIAGNOSIS — I509 Heart failure, unspecified: Secondary | ICD-10-CM | POA: Diagnosis not present

## 2021-06-23 DIAGNOSIS — Z794 Long term (current) use of insulin: Secondary | ICD-10-CM | POA: Diagnosis not present

## 2021-06-23 DIAGNOSIS — R488 Other symbolic dysfunctions: Secondary | ICD-10-CM | POA: Diagnosis not present

## 2021-06-23 DIAGNOSIS — K5720 Diverticulitis of large intestine with perforation and abscess without bleeding: Secondary | ICD-10-CM | POA: Diagnosis not present

## 2021-06-23 DIAGNOSIS — R000 Tachycardia, unspecified: Secondary | ICD-10-CM | POA: Diagnosis not present

## 2021-06-23 DIAGNOSIS — J439 Emphysema, unspecified: Secondary | ICD-10-CM | POA: Diagnosis present

## 2021-06-23 DIAGNOSIS — Z4682 Encounter for fitting and adjustment of non-vascular catheter: Secondary | ICD-10-CM | POA: Diagnosis not present

## 2021-06-23 DIAGNOSIS — Z743 Need for continuous supervision: Secondary | ICD-10-CM | POA: Diagnosis not present

## 2021-06-23 DIAGNOSIS — R531 Weakness: Secondary | ICD-10-CM | POA: Diagnosis not present

## 2021-06-23 DIAGNOSIS — G4733 Obstructive sleep apnea (adult) (pediatric): Secondary | ICD-10-CM | POA: Diagnosis present

## 2021-06-23 DIAGNOSIS — R197 Diarrhea, unspecified: Secondary | ICD-10-CM | POA: Diagnosis not present

## 2021-06-23 DIAGNOSIS — K429 Umbilical hernia without obstruction or gangrene: Secondary | ICD-10-CM | POA: Diagnosis not present

## 2021-06-23 DIAGNOSIS — I10 Essential (primary) hypertension: Secondary | ICD-10-CM | POA: Diagnosis not present

## 2021-06-23 DIAGNOSIS — R6520 Severe sepsis without septic shock: Secondary | ICD-10-CM | POA: Diagnosis not present

## 2021-06-23 DIAGNOSIS — R0902 Hypoxemia: Secondary | ICD-10-CM | POA: Diagnosis not present

## 2021-06-23 DIAGNOSIS — R29898 Other symptoms and signs involving the musculoskeletal system: Secondary | ICD-10-CM | POA: Diagnosis not present

## 2021-06-23 DIAGNOSIS — I700 Atherosclerosis of aorta: Secondary | ICD-10-CM | POA: Diagnosis not present

## 2021-06-23 DIAGNOSIS — R251 Tremor, unspecified: Secondary | ICD-10-CM | POA: Diagnosis not present

## 2021-06-23 DIAGNOSIS — R2681 Unsteadiness on feet: Secondary | ICD-10-CM | POA: Diagnosis not present

## 2021-06-23 DIAGNOSIS — Z6841 Body Mass Index (BMI) 40.0 and over, adult: Secondary | ICD-10-CM | POA: Diagnosis not present

## 2021-06-23 DIAGNOSIS — D649 Anemia, unspecified: Secondary | ICD-10-CM | POA: Diagnosis present

## 2021-06-23 DIAGNOSIS — I5032 Chronic diastolic (congestive) heart failure: Secondary | ICD-10-CM | POA: Diagnosis not present

## 2021-06-23 DIAGNOSIS — E118 Type 2 diabetes mellitus with unspecified complications: Secondary | ICD-10-CM | POA: Diagnosis not present

## 2021-06-23 DIAGNOSIS — K219 Gastro-esophageal reflux disease without esophagitis: Secondary | ICD-10-CM | POA: Diagnosis not present

## 2021-06-23 DIAGNOSIS — E872 Acidosis: Secondary | ICD-10-CM | POA: Diagnosis not present

## 2021-06-23 DIAGNOSIS — N1831 Chronic kidney disease, stage 3a: Secondary | ICD-10-CM | POA: Diagnosis not present

## 2021-06-23 DIAGNOSIS — K5792 Diverticulitis of intestine, part unspecified, without perforation or abscess without bleeding: Secondary | ICD-10-CM | POA: Diagnosis not present

## 2021-06-23 DIAGNOSIS — Z86711 Personal history of pulmonary embolism: Secondary | ICD-10-CM | POA: Diagnosis not present

## 2021-06-23 DIAGNOSIS — Z20822 Contact with and (suspected) exposure to covid-19: Secondary | ICD-10-CM | POA: Diagnosis not present

## 2021-06-23 DIAGNOSIS — R5383 Other fatigue: Secondary | ICD-10-CM | POA: Diagnosis not present

## 2021-06-23 DIAGNOSIS — I2699 Other pulmonary embolism without acute cor pulmonale: Secondary | ICD-10-CM | POA: Diagnosis not present

## 2021-06-23 DIAGNOSIS — E1122 Type 2 diabetes mellitus with diabetic chronic kidney disease: Secondary | ICD-10-CM | POA: Diagnosis not present

## 2021-06-23 DIAGNOSIS — R69 Illness, unspecified: Secondary | ICD-10-CM | POA: Diagnosis not present

## 2021-06-23 DIAGNOSIS — E785 Hyperlipidemia, unspecified: Secondary | ICD-10-CM | POA: Diagnosis not present

## 2021-06-23 DIAGNOSIS — E0822 Diabetes mellitus due to underlying condition with diabetic chronic kidney disease: Secondary | ICD-10-CM | POA: Diagnosis not present

## 2021-06-23 DIAGNOSIS — N179 Acute kidney failure, unspecified: Secondary | ICD-10-CM | POA: Diagnosis not present

## 2021-06-23 DIAGNOSIS — E860 Dehydration: Secondary | ICD-10-CM | POA: Diagnosis present

## 2021-06-23 DIAGNOSIS — Z7401 Bed confinement status: Secondary | ICD-10-CM | POA: Diagnosis not present

## 2021-06-23 DIAGNOSIS — R2689 Other abnormalities of gait and mobility: Secondary | ICD-10-CM | POA: Diagnosis not present

## 2021-06-23 DIAGNOSIS — E039 Hypothyroidism, unspecified: Secondary | ICD-10-CM | POA: Diagnosis not present

## 2021-06-23 DIAGNOSIS — Z79899 Other long term (current) drug therapy: Secondary | ICD-10-CM | POA: Diagnosis not present

## 2021-06-23 DIAGNOSIS — M6281 Muscle weakness (generalized): Secondary | ICD-10-CM | POA: Diagnosis not present

## 2021-06-23 DIAGNOSIS — R569 Unspecified convulsions: Secondary | ICD-10-CM | POA: Diagnosis not present

## 2021-06-23 DIAGNOSIS — I639 Cerebral infarction, unspecified: Secondary | ICD-10-CM | POA: Diagnosis not present

## 2021-06-23 DIAGNOSIS — E119 Type 2 diabetes mellitus without complications: Secondary | ICD-10-CM | POA: Diagnosis not present

## 2021-06-23 DIAGNOSIS — I130 Hypertensive heart and chronic kidney disease with heart failure and stage 1 through stage 4 chronic kidney disease, or unspecified chronic kidney disease: Secondary | ICD-10-CM | POA: Diagnosis not present

## 2021-06-23 DIAGNOSIS — E0821 Diabetes mellitus due to underlying condition with diabetic nephropathy: Secondary | ICD-10-CM | POA: Diagnosis not present

## 2021-06-23 DIAGNOSIS — J4540 Moderate persistent asthma, uncomplicated: Secondary | ICD-10-CM | POA: Diagnosis not present

## 2021-06-23 DIAGNOSIS — E1165 Type 2 diabetes mellitus with hyperglycemia: Secondary | ICD-10-CM | POA: Diagnosis not present

## 2021-06-23 DIAGNOSIS — R5381 Other malaise: Secondary | ICD-10-CM | POA: Diagnosis not present

## 2021-06-23 DIAGNOSIS — H0520 Unspecified exophthalmos: Secondary | ICD-10-CM | POA: Diagnosis not present

## 2021-06-23 DIAGNOSIS — Z79890 Hormone replacement therapy (postmenopausal): Secondary | ICD-10-CM | POA: Diagnosis not present

## 2021-06-23 DIAGNOSIS — M47816 Spondylosis without myelopathy or radiculopathy, lumbar region: Secondary | ICD-10-CM | POA: Diagnosis not present

## 2021-06-23 DIAGNOSIS — I272 Pulmonary hypertension, unspecified: Secondary | ICD-10-CM | POA: Diagnosis not present

## 2021-06-23 DIAGNOSIS — N19 Unspecified kidney failure: Secondary | ICD-10-CM | POA: Diagnosis not present

## 2021-06-23 DIAGNOSIS — A419 Sepsis, unspecified organism: Secondary | ICD-10-CM | POA: Diagnosis not present

## 2021-06-23 DIAGNOSIS — R404 Transient alteration of awareness: Secondary | ICD-10-CM | POA: Diagnosis not present

## 2021-06-23 DIAGNOSIS — K5730 Diverticulosis of large intestine without perforation or abscess without bleeding: Secondary | ICD-10-CM | POA: Diagnosis not present

## 2021-06-23 DIAGNOSIS — R41841 Cognitive communication deficit: Secondary | ICD-10-CM | POA: Diagnosis not present

## 2021-06-23 DIAGNOSIS — L89322 Pressure ulcer of left buttock, stage 2: Secondary | ICD-10-CM | POA: Diagnosis not present

## 2021-06-23 DIAGNOSIS — R410 Disorientation, unspecified: Secondary | ICD-10-CM | POA: Diagnosis not present

## 2021-06-23 DIAGNOSIS — L8992 Pressure ulcer of unspecified site, stage 2: Secondary | ICD-10-CM | POA: Diagnosis not present

## 2021-06-23 DIAGNOSIS — E1169 Type 2 diabetes mellitus with other specified complication: Secondary | ICD-10-CM | POA: Diagnosis not present

## 2021-06-23 LAB — CBC
HCT: 25.6 % — ABNORMAL LOW (ref 36.0–46.0)
Hemoglobin: 8.3 g/dL — ABNORMAL LOW (ref 12.0–15.0)
MCH: 32.3 pg (ref 26.0–34.0)
MCHC: 32.4 g/dL (ref 30.0–36.0)
MCV: 99.6 fL (ref 80.0–100.0)
Platelets: 194 10*3/uL (ref 150–400)
RBC: 2.57 MIL/uL — ABNORMAL LOW (ref 3.87–5.11)
RDW: 19.1 % — ABNORMAL HIGH (ref 11.5–15.5)
WBC: 9.7 10*3/uL (ref 4.0–10.5)
nRBC: 0 % (ref 0.0–0.2)

## 2021-06-23 LAB — BASIC METABOLIC PANEL
Anion gap: 5 (ref 5–15)
BUN: 8 mg/dL (ref 8–23)
CO2: 25 mmol/L (ref 22–32)
Calcium: 8.4 mg/dL — ABNORMAL LOW (ref 8.9–10.3)
Chloride: 108 mmol/L (ref 98–111)
Creatinine, Ser: 1.1 mg/dL — ABNORMAL HIGH (ref 0.44–1.00)
GFR, Estimated: 54 mL/min — ABNORMAL LOW (ref >60)
Glucose, Bld: 83 mg/dL (ref 70–99)
Potassium: 3.9 mmol/L (ref 3.5–5.1)
Sodium: 138 mmol/L (ref 135–145)

## 2021-06-23 LAB — RESP PANEL BY RT-PCR (FLU A&B, COVID) ARPGX2
Influenza A by PCR: NEGATIVE
Influenza B by PCR: NEGATIVE
SARS Coronavirus 2 by RT PCR: NEGATIVE

## 2021-06-23 LAB — MAGNESIUM: Magnesium: 1.6 mg/dL — ABNORMAL LOW (ref 1.7–2.4)

## 2021-06-23 LAB — GLUCOSE, CAPILLARY
Glucose-Capillary: 99 mg/dL (ref 70–99)
Glucose-Capillary: 103 mg/dL — ABNORMAL HIGH (ref 70–99)

## 2021-06-23 MED ORDER — MAGNESIUM SULFATE 2 GM/50ML IV SOLN
2.0000 g | Freq: Once | INTRAVENOUS | Status: AC
  Administered 2021-06-23: 2 g via INTRAVENOUS
  Filled 2021-06-23: qty 50

## 2021-06-23 MED ORDER — LEVOFLOXACIN 750 MG PO TABS: 750.0000 mg | ORAL_TABLET | Freq: Every day | ORAL | Status: DC

## 2021-06-23 MED ORDER — SODIUM CHLORIDE 0.9 % IV BOLUS
500.0000 mL | Freq: Once | INTRAVENOUS | Status: AC
  Administered 2021-06-23: 500 mL via INTRAVENOUS

## 2021-06-23 MED ORDER — SACCHAROMYCES BOULARDII 250 MG PO CAPS: 250.0000 mg | ORAL_CAPSULE | Freq: Two times a day (BID) | ORAL | Status: AC

## 2021-06-23 MED ORDER — PANTOPRAZOLE SODIUM 40 MG PO TBEC: 40.0000 mg | DELAYED_RELEASE_TABLET | Freq: Two times a day (BID) | ORAL | Status: DC

## 2021-06-23 MED ORDER — MIRTAZAPINE 7.5 MG PO TABS: 7.5000 mg | ORAL_TABLET | Freq: Every day | ORAL | Status: DC

## 2021-06-23 MED ORDER — ENSURE ENLIVE PO LIQD: 237.0000 mL | Freq: Two times a day (BID) | ORAL | 12 refills | Status: AC

## 2021-06-23 NOTE — TOC Progression Note (Signed)
Transition of Care [facility]) - Progression Note    Patient Details  Name: [name] MRN: [id number] Date of Birth: 1951/05/09  Transition of Care [facility]) CM/SW Contact  Eduard Roux, [place] Phone Number: 06/23/2021, 10:32 AM  Clinical Narrative:     Left voice message w/ admission coordinator  for [facility] , to check on authorization status- waiting on call back  Antony Blackbird, MSW, LCSW Clinical Social Worker    Expected Discharge Plan: Skilled Nursing Facility Barriers to Discharge: SNF Pending bed offer  Expected Discharge Plan and Services Expected Discharge Plan: Skilled Nursing Facility     Post Acute Care Choice: Skilled Nursing Facility                                         Social Determinants of Health (SDOH) Interventions    Readmission Risk Interventions No flowsheet data found.

## 2021-06-23 NOTE — Discharge Instructions (Addendum)
[name],  You were in the hospital with multiple major issues:  Abdominal abscess: this is being treated with antibiotics. You had a drain placed as well and will need to follow-up with the radiologist as an outpatient. Please continue your antibiotics Acute pulmonary embolism: this initially required a blood thinner. Unfortunately, this had to be discontinued secondary to significant bleeding requiring the placement of an IVC filter to hopefully prevent recurrent clots in your lungs Bleeding: this appears to have been coming from your upper GI system. Your EGD did not help in the diagnosis but your symptoms have improved. We have stopped your blood thinner. You will need to continue an acid reducing medication. Please follow-up with [name].   Jacquelin Hawking, MD

## 2021-06-23 NOTE — Discharge Summary (Signed)
Physician Discharge Summary  [name] FBP:[id number] DOB: August 21, 1951 DOA: 05/26/2021  PCP: Mckinley Jewel, MD  Admit date: 05/26/2021 Discharge date: 06/23/2021  Admitted From: Home Disposition: SNF  Recommendations for Outpatient Follow-up:  Follow up with multiple providers: PCP, Pulmonary, General surgery, Interventional radiology, Gastroenterology Please recheck BMP to ensure proper dosing of Levaquin   Discharge Condition: Stable CODE STATUS: Full code Diet recommendation: Regular diet   Brief/Interim Summary:  Admission HPI written by Rise Patience, MD   HPI: [name] is a 70 y.o. female with history of diastolic CHF, hypertension, diabetes mellitus, sleep apnea presents to the ER with complaints of abdominal pain with nausea vomiting diarrhea ongoing for 2 weeks.  Patient's abdominal pain worsened and went to the primary care physician and was referred to the ER.  Abdominal pain hurts to touch.   Hospital course:  Diverticulitis with perforation and abscess Mid-sigmoid colon abscess measures 9.2 cm x 5.8 cm. General surgery consulted and recommend IR consult for drain placement. Started empirically on Zosyn IV on admission. Afebrile. No leukocytosis. IR initially unable to find safe window for drain placement however drain was eventually placed on 6/21. Culture significant for pseudomonas aeruginosa. ID consulted and recommended transition to Levaquin PO to continue until 10 days after drain has been removed. Patent will need follow-up with interventional radiology and general surgery on discharge.   Bilateral pulmonary embolism No provoking factor identified. Slightly hypotensive on admission which resolved with IV fluids. Started on Heparin IV. CT suggests evidence of right heart strain. Heart ultrasound confirms evidence of right heart strain. Overall, vitals are not concerning. Discussed with PCCM who recommended to continue anticoagulation  with recommendation for outpatient pulmonology follow-up with Dr. Silas Flood. Heparin IV discontinued after patient developed acute anemia related to GI bleeding. Lower extremity venous duplex ordered and was significant for DVT of left popliteal vein, and left posterior tibial Veins. Interventional radiology consulted for IVC filter which was placed on 6/16.   Melena Upper GI bleed Acute blood loss anemia Complicated by heparin use for acute PE. GI consulted and patient underwent EGD on 6/19 and 6/29 which were not significant for source of bleeding. No colonoscopy or capsule endoscopy attempted per GI. Patient managed on Protonix PO BID and has been stable.   Acute respiratory failure with hypoxia Secondary to PE. Weaned to room air.   Thrombocytopenia In setting of DVT and infection. Resolved. Stable.   Chronic diastolic heart failure Stable.   CKD stage IIIa Chronic issue. Stable.   Primary hypertension Patient is on amlodipine, Coreg as an outpatient. Antihypertensives held secondary to hypotension on admission. Blood pressure controlled currently. No tachycardia noted. Discontinue Coreg/Amlodipine on discharge.   Hypokalemia Hypomagnesemia Given repletion. Improved   Hypotension Likely related to blood loss. Resolved with IV fluids and blood transfusion.   Diabetes mellitus, type 2 Uncontrolled. Complicated by prednisone. Patient was managed on Lantus which was eventually discontinued with prednisone taper.   Hypothyroidism Grave's disease with exophthalmos Continue Synthroid. Prednisone taper completed inpatient.   Morbid obesity Body mass index is 42.82 kg/m.   Discharge Diagnoses:  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 (HCC)   Moderate persistent asthma, uncomplicated   Seizure ([place])   Bilateral pulmonary embolism  ([place])    Discharge Instructions   Allergies as of 06/23/2021       Reactions   Gluten  Meal    Celiac        Medication List     STOP taking these medications    amLODipine 5 MG tablet Commonly known as: NORVASC   carvedilol 25 MG tablet Commonly known as: COREG   esomeprazole 20 MG capsule Commonly known as: NEXIUM   potassium chloride 10 MEQ tablet Commonly known as: KLOR-CON   predniSONE 20 MG tablet Commonly known as: DELTASONE       TAKE these medications    albuterol 108 (90 Base) MCG/ACT inhaler Commonly known as: VENTOLIN HFA Inhale 2 puffs into the lungs every 6 (six) hours as needed for wheezing or shortness of breath.   feeding supplement Liqd Take 237 mLs by mouth 2 (two) times daily between meals.   Fluticasone-Salmeterol 250-50 MCG/DOSE Aepb Commonly known as: ADVAIR Inhale 1 puff into the lungs daily as needed for allergies.   furosemide 20 MG tablet Commonly known as: LASIX Take 1 tablet (20 mg total) by mouth daily.   levofloxacin 750 MG tablet Commonly known as: LEVAQUIN Take 1 tablet (750 mg total) by mouth daily. Start taking on: June 24, 2021   levothyroxine 112 MCG tablet Commonly known as: SYNTHROID Take 1 tablet by mouth Monday through Saturday and 1/2 tablet on Sunday.   meclizine 25 MG tablet Commonly known as: ANTIVERT Take 1 tablet by mouth daily as needed for dizziness.   metFORMIN 500 MG tablet Commonly known as: GLUCOPHAGE Take 500 mg by mouth 2 (two) times daily.   mirtazapine 7.5 MG tablet Commonly known as: REMERON Take 1 tablet (7.5 mg total) by mouth at bedtime.   onetouch ultrasoft lancets Use as instructed   OneTouch Verio Reflect w/Device Kit 1 each by Other route as directed.   OneTouch Verio test strip Generic drug: glucose blood Use as intstructed to check blood sugar once a day Dx Code E11.9   OVER THE COUNTER MEDICATION Take 1 tablet by mouth daily. Medication: Vitamin b12 3000 mcg    pantoprazole 40 MG tablet Commonly known as: PROTONIX Take 1 tablet (40 mg total) by mouth 2 (two) times daily before a meal.   rosuvastatin 5 MG tablet Commonly known as: CRESTOR Take 1 tablet (5 mg total) by mouth daily.   saccharomyces boulardii 250 MG capsule Commonly known as: FLORASTOR Take 1 capsule (250 mg total) by mouth 2 (two) times daily.        Contact information for follow-up providers     Hunsucker, Bonna Gains, MD. Schedule an appointment as soon as possible for a visit.   Specialty: Pulmonary Disease Why: hospital follow up, Pulmonary Embolism Contact information: [place] [place] [address] [phone]         Ileana Roup, MD Follow up.   Specialties: General Surgery, Colon and Rectal Surgery Why: Follow up with Dr Dema Severin on 07/18/21 at 11:20 am.  Please arrive 30 minutes prior to appt to complete paperwork and check in. Bring photo ID and insurance card Contact information: [place] [address] [phone]         Gastroenterology, Sadie Haber Follow up.   Why: call to follow up with your established GI provider in 8 weeks Contact information: [place] [address] [phone]         Sandi Mariscal, MD Follow up in 1 week(s).   Specialties: Interventional Radiology, Radiology Why: pt will hear from [facility] for time and date of follow  up--- call [phone] if any questions Contact information: [address] [place] [address] [phone]              Contact information for after-discharge care     Destination     [facility] .   Service: Skilled Nursing Contact information: [place] [phone]                    Allergies  Allergen Reactions   Gluten Meal     Celiac    Consultations: Interventional radiology Gastroenterology General  surgery Infectious disease   Procedures/Studies: DG Chest 2 View  Result Date: 05/26/2021 CLINICAL DATA:  Hypoxia. EXAM: CHEST - 2 VIEW COMPARISON:  May 23, 2021. FINDINGS: The heart size and mediastinal contours are within normal limits. Both lungs are clear. The visualized skeletal structures are unremarkable. IMPRESSION: No active cardiopulmonary disease. Electronically Signed   By: Marijo Conception M.D.   On: 05/26/2021 16:48   CT ABDOMEN PELVIS W CONTRAST  Result Date: 06/12/2021 CLINICAL DATA:  Intra-abdominal abscess. EXAM: CT ABDOMEN AND PELVIS WITH CONTRAST TECHNIQUE: Multidetector CT imaging of the abdomen and pelvis was performed using the standard protocol following bolus administration of intravenous contrast. CONTRAST:  139m OMNIPAQUE IOHEXOL 300 MG/ML  SOLN COMPARISON:  June 03, 2021. FINDINGS: Lower chest: No acute abnormality. Hepatobiliary: No focal liver abnormality is seen. No gallstones, gallbladder wall thickening, or biliary dilatation. Pancreas: Unremarkable. No pancreatic ductal dilatation or surrounding inflammatory changes. Spleen: Normal in size without focal abnormality. Adrenals/Urinary Tract: Adrenal glands are unremarkable. Kidneys are normal, without renal calculi, focal lesion, or hydronephrosis. Bladder is unremarkable. Stomach/Bowel: Stomach appears normal. No colonic dilatation is noted. No evidence of bowel obstruction is noted. Vascular/Lymphatic: Aortic atherosclerosis. No enlarged abdominal or pelvic lymph nodes. IVC filter is noted in infrarenal position. Reproductive: Uterus and bilateral adnexa are unremarkable. Other: There is been interval placement of percutaneous drainage catheter into large pelvic abscess noted on prior exam. The abscess appears to be nearly completely decompressed. Musculoskeletal: No acute or significant osseous findings. IMPRESSION: Interval placement of percutaneous drainage catheter into large pelvic abscess noted on prior exam. The  abscess appears to be nearly completely decompressed. Electronically Signed   By: JMarijo ConceptionM.D.   On: 06/12/2021 16:07   CT ABDOMEN PELVIS W CONTRAST  Result Date: 06/03/2021 CLINICAL DATA:  History of diverticulitis, follow-up exam EXAM: CT ABDOMEN AND PELVIS WITH CONTRAST TECHNIQUE: Multidetector CT imaging of the abdomen and pelvis was performed using the standard protocol following bolus administration of intravenous contrast. CONTRAST:  1064mOMNIPAQUE IOHEXOL 300 MG/ML  SOLN COMPARISON:  05/26/2021 FINDINGS: Lower chest: Lung bases demonstrate some minimal atelectatic changes on the right. Previously seen pulmonary emboli are again identified right greater than left. Hepatobiliary: Fatty infiltration of the liver is noted. A small cyst is noted within the right lobe. Gallbladder is unremarkable. Pancreas: Unremarkable. No pancreatic ductal dilatation or surrounding inflammatory changes. Spleen: Normal in size without focal abnormality. Adrenals/Urinary Tract: Adrenal glands are unremarkable. Kidneys demonstrate a normal enhancement pattern bilaterally. No definitive renal calculi are seen. No obstructive changes are noted. Normal excretion is noted on delayed images. The bladder is well distended. Stomach/Bowel: Rectal tube is noted within the rectum. The sigmoid colon again demonstrates diverticulosis although the degree of inflammatory change has improved in the interval from the prior exam. The more proximal colon is unremarkable. Appendix is well visualized and within normal  limits. Extraluminal air is noted surrounding the colon in the pelvis slightly increased in the interval from the prior exam. Additionally a large air-fluid collection is again identified interposed between the small bowel loops and uterus adjacent to the sigmoid colon. This now measures 10.5 x 6.8 cm in greatest transverse and AP dimensions and extends for at least 6.7 cm in craniocaudad projection. This is again consistent  with a large abscess. The abscess is again draped with multiple loops of small bowel precluding percutaneous drainage. The surrounding small bowel loops again demonstrates some mild inflammatory thickening. More proximal small bowel is within normal limits. No obstructive changes are seen. The stomach is unremarkable. Vascular/Lymphatic: Aortic atherosclerosis. No enlarged abdominal or pelvic lymph nodes. IVC filter is now seen in satisfactory position. Reproductive: Uterus and bilateral adnexa are unremarkable. Other: No free fluid is noted within the pelvis. No herniation is seen. Musculoskeletal: Degenerative changes of lumbar spine are noted. No acute bony abnormality is seen. IMPRESSION: Persistent and increasing abscess within the lower abdomen/pelvis interposed between multiple loops of inflamed small bowel in the uterus. The abscess is again draped with multiple loops of inflamed small bowel precluding percutaneous drainage. Persistent bilateral pulmonary emboli right greater than left. Fatty liver. Previously seen diverticulitis inflammatory change has improved in the interval from the prior study. Electronically Signed   By: Inez Catalina M.D.   On: 06/03/2021 15:21   CT Abdomen Pelvis W Contrast  Result Date: 05/26/2021 CLINICAL DATA:  Two week history of abdominal pain, nausea, vomiting and diarrhea. Abdominal tenderness and hypotensive. EXAM: CT ABDOMEN AND PELVIS WITH CONTRAST TECHNIQUE: Multidetector CT imaging of the abdomen and pelvis was performed using the standard protocol following bolus administration of intravenous contrast. CONTRAST:  64m OMNIPAQUE IOHEXOL 300 MG/ML  SOLN COMPARISON:  None. FINDINGS: Lower chest: The lung bases are clear of an acute process. No pleural effusions or pulmonary lesions. Findings of bilateral central and lower lobe pulmonary emboli are noted. A dedicated CT chest may be helpful for further evaluation but there does appear to be flattening of the left  ventricle which could suggest right heart strain. Hepatobiliary: No hepatic lesions or intrahepatic biliary dilatation. The gallbladder is grossly normal. No common bile duct dilatation. Pancreas: No mass, inflammation or ductal dilatation. Spleen: Normal size. No focal lesions. Adrenals/Urinary Tract: Adrenal glands and kidneys are unremarkable. The bladder is unremarkable. Stomach/Bowel: The stomach, duodenum and small bowel are unremarkable. No findings for small bowel obstruction. There are some mildly inflamed distal ileal loops of small bowel in the pelvis due to a large pelvic abscess. The terminal ileum is normal. The appendix is. There is sigmoid colon diverticulosis and I suspect there is perforated diverticulitis involving the mid sigmoid colon. There is a large complex abscess anterior to the uterus and in between distal small bowel loops. This measures a maximum of 9.2 x 5.8 cm. There is also some associated free pelvic fluid. Vascular/Lymphatic: Moderate scattered atherosclerotic changes involving the aorta and iliac arteries. No aneurysm. The major venous structures are patent. No mesenteric or retroperitoneal mass or adenopathy. No pelvic adenopathy or inguinal adenopathy. Reproductive: The uterus and ovaries are unremarkable. There are calcifications associated with both ovaries. Other: Small amount of free pelvic fluid. Musculoskeletal: No significant bony findings. IMPRESSION: 1. CT findings consistent with perforated diverticulitis involving the mid sigmoid colon with a large (9.2 x 5.8 cm) complex abscess anterior to the uterus and in between distal small bowel loops. 2. Bilateral central and lower lobe pulmonary  emboli. Dedicated CTA chest for further evaluation may be helpful if clinically necessary. There is flattening of the left ventricle which suggesting right heart strain. These results were called by telephone at the time of interpretation on 05/26/2021 at 8:05 pm to provider [facility]  , who verbally acknowledged these results. Aortic Atherosclerosis (ICD10-I70.0). Electronically Signed   By: Marijo Sanes M.D.   On: 05/26/2021 20:05   IR Sinus/Fist Tube Chk-Non GI  Result Date: 06/14/2021 INDICATION: 70 year old woman status post pelvic abscess drain placement on 06/16/2021 returns for obsessive g given decreased output. EXAM: SINUS TRACT INJECTION / FISTULOGRAM COMPARISON:  None. MEDICATIONS: The patient is currently admitted to the hospital and receiving intravenous antibiotics. The antibiotics were administered within an appropriate time frame prior to the initiation of the procedure. ANESTHESIA/SEDATION: None COMPLICATIONS: None immediate. TECHNIQUE: Informed written consent was obtained from the patient after a thorough discussion of the procedural risks, benefits and alternatives. A timeout was performed prior to the initiation of the procedure. PROCEDURE: Patient positioned supine on the procedure table. Scout image demonstrated pelvic drain in appropriate position. Contrast administered through the drain demonstrated a residual abscess cavity measuring at least 20 mL in size. No fistulous communication is identified. IMPRESSION: Pelvic abscessogram demonstrates significant residual cavity. No fistulous communication identified. Drain left in place given persistent cavity. PLAN: Consider abscessogram and/or drain removal once net daily output is less than 10 mL. Electronically Signed   By: Miachel Roux M.D.   On: 06/14/2021 14:12   IR IVC FILTER PLMT / S&I Burke Keels GUID/MOD SED  Result Date: 06/01/2021 INDICATION: PE/DVT.  Acute GI bleed. EXAM: Retrievable IVC filter placement MEDICATIONS: None. ANESTHESIA/SEDATION: One mg IV Versed; 25 mcg IV Fentanyl Moderate Sedation Time:  10 minutes The patient was continuously monitored during the procedure by the interventional radiology nurse under my direct supervision. FLUOROSCOPY TIME:  Fluoroscopy Time: 0 minutes 48 seconds (76 mGy).  COMPLICATIONS: None immediate. PROCEDURE: Informed written consent was obtained from the patient after a thorough discussion of the procedural risks, benefits and alternatives. All questions were addressed. Maximal Sterile Barrier Technique was utilized including caps, mask, sterile gowns, sterile gloves, sterile drape, hand hygiene and skin antiseptic. A timeout was performed prior to the initiation of the procedure. Patient positioned supine on the angiography table. Right neck prepped and draped in usual sterile fashion. All elements of maximal sterile barrier were utilized including, cap, mask, sterile gown, sterile gloves, large sterile drape, hand scrubbing and 2% Chlorhexidine for skin cleaning. The right internal jugular vein was evaluated with ultrasound and shown to be patent. A permanent ultrasound image was obtained and placed in the patient's medical record. Using sterile gel and a sterile probe cover, the right internal jugular vein was entered with a 21 ga needle during real time ultrasound guidance. 21 gauge needle exchanged for a transitional dilator set over 0.018 inch guidewire. Transitional dilator set exchanged for 10 fr sheath over 0.035 inch guidewire. Sheath placed to the infrarenal IVC and CO2 venogram performed to delineate the position of the renal veins. Retrievable Denali IVC filter deployed in the infrarenal location. Sheath removed and hemostasis achieved with manual compression. IMPRESSION: Retrievable IVC filter placement as above. PLAN: This IVC filter is potentially retrievable. The patient will be assessed for filter retrieval by Interventional Radiology in approximately 8-12 weeks. Further recommendations regarding filter retrieval, continued surveillance or declaration of device permanence, will be made at that time. Electronically Signed   By: Miachel Roux M.D.   On:  06/01/2021 16:42   IR US Guide Vasc Access Right  Result Date: 06/01/2021 INDICATION: PE/DVT.  Acute GI  bleed. EXAM: Retrievable IVC filter placement MEDICATIONS: None. ANESTHESIA/SEDATION: One mg IV Versed; 25 mcg IV Fentanyl Moderate Sedation Time:  10 minutes The patient was continuously monitored during the procedure by the interventional radiology nurse under my direct supervision. FLUOROSCOPY TIME:  Fluoroscopy Time: 0 minutes 48 seconds (76 mGy). COMPLICATIONS: None immediate. PROCEDURE: Informed written consent was obtained from the patient after a thorough discussion of the procedural risks, benefits and alternatives. All questions were addressed. Maximal Sterile Barrier Technique was utilized including caps, mask, sterile gowns, sterile gloves, sterile drape, hand hygiene and skin antiseptic. A timeout was performed prior to the initiation of the procedure. Patient positioned supine on the angiography table. Right neck prepped and draped in usual sterile fashion. All elements of maximal sterile barrier were utilized including, cap, mask, sterile gown, sterile gloves, large sterile drape, hand scrubbing and 2% Chlorhexidine for skin cleaning. The right internal jugular vein was evaluated with ultrasound and shown to be patent. A permanent ultrasound image was obtained and placed in the patient's medical record. Using sterile gel and a sterile probe cover, the right internal jugular vein was entered with a 21 ga needle during real time ultrasound guidance. 21 gauge needle exchanged for a transitional dilator set over 0.018 inch guidewire. Transitional dilator set exchanged for 10 fr sheath over 0.035 inch guidewire. Sheath placed to the infrarenal IVC and CO2 venogram performed to delineate the position of the renal veins. Retrievable Denali IVC filter deployed in the infrarenal location. Sheath removed and hemostasis achieved with manual compression. IMPRESSION: Retrievable IVC filter placement as above. PLAN: This IVC filter is potentially retrievable. The patient will be assessed for filter retrieval by  Interventional Radiology in approximately 8-12 weeks. Further recommendations regarding filter retrieval, continued surveillance or declaration of device permanence, will be made at that time. Electronically Signed   By: Miachel Roux M.D.   On: 06/01/2021 16:42   ECHOCARDIOGRAM COMPLETE  Result Date: 05/27/2021    ECHOCARDIOGRAM REPORT   Patient Name:   [name] Date of Exam: 05/27/2021 Medical Rec #:  [id number]          Height:       62.0 in Accession #:    [id number]         Weight:       240.5 lb Date of Birth:  1951-10-14         BSA:          2.068 m Patient Age:    70 years           BP:           117/68 mmHg Patient Gender: F                  HR:           86 bpm. Exam Location:  Inpatient Procedure: 2D Echo, Cardiac Doppler and Color Doppler STAT ECHO Indications:    Pulmonary embolus  History:        Patient has prior history of Echocardiogram examinations, most                 recent 01/02/2012. CHF; Risk Factors:Sleep Apnea, Hypertension                 and Diabetes.  Sonographer:    Clayton Lefort RDCS (AE) Referring Phys: [place] Kareem Aul  Sonographer  Comments: Patient is morbidly obese. Image acquisition challenging due to patient body habitus. Image acquistion challening due to patient movement. IMPRESSIONS  1. Left ventricular ejection fraction, by estimation, is 65 to 70%. The left ventricle has normal function. The left ventricle has no regional wall motion abnormalities. There is severe concentric left ventricular hypertrophy. 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  2. RV incompletely visualized. Based on available views, RV basal systolic function appears relatively preserved. The mid-to-apcial RV free wall appears mildly hypokinetic. The right ventricular size is mildly enlarged. There is severely elevated pulmonary artery systolic pressure. The estimated right ventricular systolic pressure is 65 mmHg.  3. The mitral valve is degenerative. Trivial mitral valve  regurgitation. No evidence of mitral stenosis. Severe mitral annular calcification.  4. The aortic valve is tricuspid. There is mild calcification of the aortic valve. There is mild thickening of the aortic valve. Aortic valve regurgitation is mild. Mild to moderate aortic valve sclerosis/calcification is present, without any evidence of aortic stenosis.  5. Aortic dilatation noted. There is mild dilatation of the ascending aorta, measuring 41 mm.  6. The inferior vena cava is normal in size with greater than 50% respiratory variability, suggesting right atrial pressure of 3 mmHg.  7. Left atrial size was mildly dilated.  8. Severely elevated PASP with mild RV dilation and systolic dysfunction suggestive of RV strain in the setting of acute PE. Comparison(s): Compared to prior TTE in 2013, the PASP is now severely elevated at 65mHg (previously normal) consistent with acute PE. FINDINGS  Left Ventricle: Left ventricular ejection fraction, by estimation, is 65 to 70%. The left ventricle has normal function. The left ventricle has no regional wall motion abnormalities. The left ventricular internal cavity size was small. There is severe concentric left ventricular hypertrophy. 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 Right Ventricle: RV incompletely visualized. The right ventricular size is mildly enlarged. Right vetricular wall thickness was not well visualized. Based on available views, RV basal systolic function appears relatively preserved. The mid free wall appears mildly hypokinetic on available views. There is severely elevated pulmonary artery systolic pressure. The tricuspid regurgitant velocity is 3.87 m/s, and with an assumed right atrial pressure of 5 mmHg, the estimated right ventricular systolic pressure is 600.8mmHg. Left Atrium: Left atrial size was mildly dilated. Right Atrium: Right atrial size was normal in size. Pericardium: There is no evidence of pericardial effusion. Mitral Valve: The  mitral valve is degenerative in appearance. There is moderate thickening of the mitral valve leaflet(s). There is moderate calcification of the mitral valve leaflet(s). Severe mitral annular calcification. Trivial mitral valve regurgitation. No evidence of mitral valve stenosis. Tricuspid Valve: The tricuspid valve is normal in structure. Tricuspid valve regurgitation is trivial. Aortic Valve: The aortic valve is tricuspid. There is mild calcification of the aortic valve. There is mild thickening of the aortic valve. Aortic valve regurgitation is mild. Aortic regurgitation PHT measures 635 msec. Mild to moderate aortic valve sclerosis/calcification is present, without any evidence of aortic stenosis. Aortic valve mean gradient measures 6.3 mmHg. Aortic valve peak gradient measures 10.6 mmHg. Aortic valve area, by VTI measures 2.75 cm. Pulmonic Valve: The pulmonic valve was grossly normal. Pulmonic valve regurgitation is trivial. Aorta: Aortic dilatation noted. There is mild dilatation of the ascending aorta, measuring 41 mm. Venous: The inferior vena cava is normal in size with greater than 50% respiratory variability, suggesting right atrial pressure of 3 mmHg. IAS/Shunts: No atrial level shunt detected by color flow Doppler.  LEFT VENTRICLE PLAX 2D LVIDd:         2.40 cm LVIDs:         1.80 cm LV PW:         1.90 cm LV IVS:        2.10 cm LVOT diam:     2.20 cm LV SV:         68 LV SV Index:   33 LVOT Area:     3.80 cm  RIGHT VENTRICLE             IVC RV Basal diam:  2.50 cm     IVC diam: 1.40 cm RV S prime:     13.70 cm/s TAPSE (M-mode): 2.7 cm LEFT ATRIUM             Index       RIGHT ATRIUM           Index LA diam:        3.40 cm 1.64 cm/m  RA Area:     13.60 cm LA Vol (A2C):   41.8 ml 20.22 ml/m RA Volume:   26.80 ml  12.96 ml/m LA Vol (A4C):   48.5 ml 23.46 ml/m LA Biplane Vol: 44.7 ml 21.62 ml/m  AORTIC VALVE AV Area (Vmax):    2.55 cm AV Area (Vmean):   2.78 cm AV Area (VTI):     2.75 cm AV Vmax:            162.67 cm/s AV Vmean:          115.667 cm/s AV VTI:            0.248 m AV Peak Grad:      10.6 mmHg AV Mean Grad:      6.3 mmHg LVOT Vmax:         109.00 cm/s LVOT Vmean:        84.567 cm/s LVOT VTI:          0.179 m LVOT/AV VTI ratio: 0.72 AI PHT:            635 msec  AORTA Ao Root diam: 3.20 cm Ao Asc diam:  4.10 cm TRICUSPID VALVE TR Peak grad:   59.9 mmHg TR Vmax:        387.00 cm/s  SHUNTS Systemic VTI:  0.18 m Systemic Diam: 2.20 cm Gwyndolyn Kaufman MD Electronically signed by Gwyndolyn Kaufman MD Signature Date/Time: 05/27/2021/4:43:56 PM    Final    CT IMAGE GUIDED DRAINAGE BY PERCUTANEOUS CATHETER  Result Date: 06/06/2021 INDICATION: Diverticular abscess. Please perform image guided drainage catheter placement for infection source control purposes. Note, patient was to undergo image guided drainage catheter placement on 05/28/2021 however the procedure was terminated secondary to lack of an adequate percutaneous window. EXAM: ULTRASOUND CT-GUIDED LOWER ABDOMINAL/UPPER PELVIC DIVERTICULAR ABSCESS DRAINAGE CATHETER PLACEMENT COMPARISON:  CT abdomen pelvis-06/03/2021; 05/26/2021 Aborted image guided drainage catheter placement-05/28/2021 MEDICATIONS: The patient is currently admitted to the hospital and receiving intravenous antibiotics. The antibiotics were administered within an appropriate time frame prior to the initiation of the procedure. ANESTHESIA/SEDATION: Moderate (conscious) sedation was employed during this procedure. A total of Versed 2 mg and Fentanyl 100 mcg was administered intravenously. Moderate Sedation Time: 21 minutes. The patient's level of consciousness and vital signs were monitored continuously by radiology nursing throughout the procedure under my direct supervision. CONTRAST:  None COMPLICATIONS: None immediate. PROCEDURE: Informed written consent was obtained from the patient after a discussion of the risks, benefits and alternatives to  treatment. The patient was  placed supine on the CT gantry and a pre procedural CT was performed re-demonstrating the known abscess/fluid collection within the midline of the lower abdomen/pelvis with dominant ill-defined air in fluid containing component measuring at least 9.1 x 7.1 cm (image 13, series 2). The CT gantry table position was marked and the collection was identified sonographically. The procedure was planned. A timeout was performed prior to the initiation of the procedure. The skin overlying the ventral aspect of the lower abdomen/upper pelvis was prepped and draped in the usual sterile fashion. The overlying soft tissues were anesthetized with 1% lidocaine with epinephrine. Under direct ultrasound guidance, the complex fluid collection was accessed with 18 gauge trocar needle. Multiple ultrasound images were saved procedural documentation purposes. A small amount of purulent fluid was aspirated and a short Amplatz wire was coiled within the collection. Appropriate positioning was confirmed with a limited CT scan. The tract was serially dilated allowing placement of a 10 [place] all-purpose drainage catheter. Appropriate positioning was confirmed with a limited postprocedural CT scan. Approximately 180 ml of purulent fluid was aspirated. The tube was connected to a drainage bag and sutured in place. A dressing was placed. The patient tolerated the procedure well without immediate post procedural complication. IMPRESSION: Successful ultrasound and CT guided placement of a 10 [place] all purpose drain catheter into the midline of the lower abdomen/upper pelvis with aspiration of 180 mL of purulent fluid. Samples were sent to the laboratory as requested by the ordering clinical team. Electronically Signed   By: Sandi Mariscal M.D.   On: 06/06/2021 14:30   CT IMAGE GUIDED DRAINAGE BY PERCUTANEOUS CATHETER  Result Date: 05/28/2021 INDICATION: 70 year old female presents for possible drainage of lower abdominal/pelvic abscess EXAM:  CT-GUIDED DRAINAGE OF PELVIC ABSCESS MODIFIED EXAM DISCONTINUATION FOR INABILITY TO PERFORM ABSCESS DRAINAGE MEDICATIONS: None ANESTHESIA/SEDATION: None COMPLICATIONS: None TECHNIQUE: Informed written consent was obtained from the patient after a thorough discussion of the procedural risks, benefits and alternatives. All questions were addressed. Maximal Sterile Barrier Technique was utilized including caps, mask, sterile gowns, sterile gloves, sterile drape, hand hygiene and skin antiseptic. A timeout was performed prior to the initiation of the procedure. PROCEDURE: Patient was positioned supine position on the CT gantry table. Scout CT was performed for planning purposes. After careful review of the images, we withdrew from the procedure, as there is no safe window for access into the fluid collection at this time. FINDINGS: Fluid collection persists just superior to the urinary bladder and the uterus, with small bowel surrounding fluid and gas collection in the central mesentery of the low abdomen. There is no safe window for percutaneous needle puncture. We withdrew from the attempt. IMPRESSION: Attempted CT-guided drainage of abdominal/pelvic abscess identifying no safe window for attempt at this time. Exam was discontinued. Signed, Dulcy Fanny. Dellia Nims, RPVI [facility] Electronically Signed   By: Corrie Mckusick D.O.   On: 05/28/2021 12:12   VAS [place] LOWER EXTREMITY VENOUS (DVT)  Result Date: 05/31/2021  Lower Venous DVT Study Patient Name:  [name] VAMPLE Spark  Date of Exam:   05/31/2021 Medical Rec #: [id number]           Accession #:    [id number] Date of Birth: [birth year]          Patient Gender: F Patient Age:   69Y Exam Location:  [facility] Procedure:      VAS [place] LOWER EXTREMITY VENOUS (DVT) Referring Phys:  Castle Yarianna Varble --------------------------------------------------------------------------------  Indications: Pulmonary  embolism.  Risk Factors: Immobility. Limitations: Body habitus, poor ultrasound/tissue interface and Patient positioning. Comparison Study: No previous exams Performing Technologist: Jody Hill RVT, RDMS  Examination Guidelines: A complete evaluation includes B-mode imaging, spectral Doppler, color Doppler, and power Doppler as needed of all accessible portions of each vessel. Bilateral testing is considered an integral part of a complete examination. Limited examinations for reoccurring indications may be performed as noted. The reflux portion of the exam is performed with the patient in reverse Trendelenburg.  +---------+---------------+---------+-----------+----------+-------------------+ RIGHT    CompressibilityPhasicitySpontaneityPropertiesThrombus Aging      +---------+---------------+---------+-----------+----------+-------------------+ CFV      Full           Yes      Yes                                      +---------+---------------+---------+-----------+----------+-------------------+ SFJ      Full                                                             +---------+---------------+---------+-----------+----------+-------------------+ FV Prox  Full           Yes      Yes                                      +---------+---------------+---------+-----------+----------+-------------------+ FV Mid   Full           Yes      Yes                                      +---------+---------------+---------+-----------+----------+-------------------+ FV DistalFull           Yes      Yes                                      +---------+---------------+---------+-----------+----------+-------------------+ PFV      Full                                                             +---------+---------------+---------+-----------+----------+-------------------+ POP                     Yes      Yes                  Patent by color and                                                        doppler             +---------+---------------+---------+-----------+----------+-------------------+  PTV      Full                                                             +---------+---------------+---------+-----------+----------+-------------------+ PERO                                                  not visualized on                                                         this exam           +---------+---------------+---------+-----------+----------+-------------------+   Right Technical Findings: Right leg not evaluated. Not visualized segments include peroneal veins.  +---------+---------------+---------+-----------+----------+-------------------+ LEFT     CompressibilityPhasicitySpontaneityPropertiesThrombus Aging      +---------+---------------+---------+-----------+----------+-------------------+ CFV      Full           Yes      Yes                                      +---------+---------------+---------+-----------+----------+-------------------+ SFJ      Full                                                             +---------+---------------+---------+-----------+----------+-------------------+ FV Prox  Full           Yes      Yes                                      +---------+---------------+---------+-----------+----------+-------------------+ FV Mid   Full           Yes      Yes                                      +---------+---------------+---------+-----------+----------+-------------------+ FV DistalFull           Yes      Yes                                      +---------+---------------+---------+-----------+----------+-------------------+ PFV      Full                                                             +---------+---------------+---------+-----------+----------+-------------------+ POP  None           No       No                   Acute                +---------+---------------+---------+-----------+----------+-------------------+ PTV      None           No       No                   Acute               +---------+---------------+---------+-----------+----------+-------------------+ PERO                                                  not visualized on                                                         this exam           +---------+---------------+---------+-----------+----------+-------------------+   Left Technical Findings: Left leg not evaluated. Not visualized segments include peroneal veins.   Summary: BILATERAL: - No evidence of superficial venous thrombosis in the lower extremities, bilaterally. -No evidence of popliteal cyst, bilaterally. RIGHT: - There is no evidence of deep vein thrombosis in the lower extremity.  LEFT: - Findings consistent with acute deep vein thrombosis involving the left popliteal vein, and left posterior tibial veins.  *See table(s) above for measurements and observations. Electronically signed by Harold Barban MD on 05/31/2021 at 7:48:20 PM.    Final    [place] EKG SITE RITE  Result Date: 05/31/2021 If Site Rite image not attached, placement could not be confirmed due to current cardiac rhythm.  CT Angio Abd/Pel w/ and/or w/o  Result Date: 06/14/2021 CLINICAL DATA:  GI bleed EXAM: CTA ABDOMEN AND PELVIS WITHOUT AND WITH CONTRAST TECHNIQUE: Multidetector CT imaging of the abdomen and pelvis was performed using the standard protocol during bolus administration of intravenous contrast. Multiplanar reconstructed images and MIPs were obtained and reviewed to evaluate the vascular anatomy. CONTRAST:  183m OMNIPAQUE IOHEXOL 350 MG/ML SOLN COMPARISON:  None. FINDINGS: During the study, the injector shot off which stops the scanning. Only arterial phase imaging in the upper abdomen was obtained. The study was restarted, but this resulted in non arterial phase imaging through the pelvis. Therefore, the study  is nondiagnostic for GI bleed. VASCULAR Aorta: Normal caliber.  Scattered calcifications. Celiac: Patent SMA: Patent Renals: Patent IMA: Patent Inflow: Calcifications in the iliac vessels. No aneurysm or dissection. Proximal Outflow: Grossly unremarkable Veins: No obvious venous abnormality within the limitations of this study. IVC filter in the infrarenal IVC. Review of the MIP images confirms the above findings. NON-VASCULAR Lower chest: No acute abnormality. Hepatobiliary: No focal hepatic abnormality. Gallbladder unremarkable. Pancreas: No focal abnormality or ductal dilatation. Spleen: No focal abnormality.  Normal size. Adrenals/Urinary Tract: No adrenal abnormality. No focal renal abnormality. No stones or hydronephrosis. Urinary bladder is unremarkable. Stomach/Bowel: Scattered colonic diverticula. No active extravasation seen within the transverse colon, hepatic flexure or splenic flexure. Cannot assess for contrast extravasation in the lower abdomen and pelvis.  Lymphatic: No adenopathy Reproductive: Uterus and adnexa unremarkable.  No mass. Other: There is a midline pigtail drainage catheter in place in the pelvis just superior to the urinary bladder. No surrounding fluid collection. No free fluid or free air. Musculoskeletal: No acute bony abnormality. IMPRESSION: VASCULAR Nondiagnostic study for GI bleed due to injector and scanner malfunction. This could be repeated in 24-48 hours if symptoms persist and felt clinically indicated. No evidence of aortic aneurysm or dissection. Scattered aortic and iliac calcifications. NON-VASCULAR Scattered colonic diverticulosis. Electronically Signed   By: Rolm Baptise M.D.   On: 06/14/2021 00:39    UPPER ENDOSCOPY (06/01/2021) Impression:               - Normal esophagus.                           - Erythematous mucosa in the antrum.                           - Duodenitis.                           - No specimens collected. Recommendation:           - Return  patient to hospital ward for ongoing care.                           - Full liquid diet.                           - Continue present medications.                           - Continue with PPI.                           - Check H. pylori serology in this situation and                           treat if positive.                           - Follow HGB and transfuse if necessary.                           - Management of diverticular abscess per Surgery.                           - Signing off. Call with any questions.  UPPER ENDOSCOPY (06/14/2021) Impression:               - Normal esophagus.                           - Erythematous mucosa in the antrum.                           - Hematin (altered blood/coffee-ground-like  material) in the gastric body.                           - Erythematous duodenopathy.                           - No specimens collected. Recommendation:           - Return patient to hospital ward for ongoing care.                           - Clear liquid diet.                           - Continue with pantoprazole.                           - Follow HGB and transfuse if necessary.                           - If bleeding recurs, reattempt CTA.  Subjective: No concerns today  Discharge Exam: Vitals:   06/23/21 0727 06/23/21 1125  BP: 116/86 91/67  Pulse: (!) 103 95  Resp: 18 17  Temp: 97.8 F (36.6 C) 97.7 F (36.5 C)  SpO2: 98% 95%   Vitals:   06/23/21 0000 06/23/21 0400 06/23/21 0727 06/23/21 1125  BP: 120/78 106/68 116/86 91/67  Pulse: (!) 102 90 (!) 103 95  Resp: _0 Temp: 98.7 F (37.1 C) 98.7 F (37.1 C) 97.8 F (36.6 C) 97.7 F (36.5 C)  TempSrc: Oral Oral Oral Oral  SpO2: 95% 95% 98% 95%  Weight:      Height:        General: Pt is alert, awake, not in acute distress Cardiovascular: RRR, S1/S2 +, no rubs, no gallops Respiratory: CTA bilaterally, no wheezing, no rhonchi Abdominal: Soft, NT, ND, bowel  sounds + Extremities: no edema, no cyanosis    The results of significant diagnostics from this hospitalization (including imaging, microbiology, ancillary and laboratory) are listed below for reference.     Microbiology: Recent Results (from the past 240 hour(s))  Resp Panel by RT-PCR (Flu A&B, Covid) Nasopharyngeal Swab     Status: None   Collection Time: 06/23/21 12:43 PM   Specimen: Nasopharyngeal Swab; Nasopharyngeal(NP) swabs in vial transport medium  Result Value Ref Range Status   SARS Coronavirus 2 by RT PCR NEGATIVE NEGATIVE Final    Comment: (NOTE) SARS-CoV-2 target nucleic acids are NOT DETECTED.  The SARS-CoV-2 RNA is generally detectable in upper respiratory specimens during the acute phase of infection. The lowest concentration of SARS-CoV-2 viral copies this assay can detect is 138 copies/mL. A negative result does not preclude SARS-Cov-2 infection and should not be used as the sole basis for treatment or other patient management decisions. A negative result may occur with  improper specimen collection/handling, submission of specimen other than nasopharyngeal swab, presence of viral mutation(s) within the areas targeted by this assay, and inadequate number of viral copies(<138 copies/mL). A negative result must be combined with clinical observations, patient history, and epidemiological information. The expected result is Negative.  Fact Sheet for Patients:  EntrepreneurPulse.com.au  Fact Sheet for Healthcare Providers:  IncredibleEmployment.be  This test is no t yet approved or cleared by the [place]  States FDA and  has been authorized for detection and/or diagnosis of SARS-CoV-2 by FDA under an Emergency Use Authorization (EUA). This EUA will remain  in effect (meaning this test can be used) for the duration of the COVID-19 declaration under Section 564(b)(1) of the Act, 21 U.S.C.section 360bbb-3(b)(1), unless the  authorization is terminated  or revoked sooner.       Influenza A by PCR NEGATIVE NEGATIVE Final   Influenza B by PCR NEGATIVE NEGATIVE Final    Comment: (NOTE) The Xpert Xpress SARS-CoV-2/FLU/RSV plus assay is intended as an aid in the diagnosis of influenza from Nasopharyngeal swab specimens and should not be used as a sole basis for treatment. Nasal washings and aspirates are unacceptable for Xpert Xpress SARS-CoV-2/FLU/RSV testing.  Fact Sheet for Patients: EntrepreneurPulse.com.au  Fact Sheet for Healthcare Providers: IncredibleEmployment.be  This test is not yet approved or cleared by the [place] FDA and has been authorized for detection and/or diagnosis of SARS-CoV-2 by FDA under an Emergency Use Authorization (EUA). This EUA will remain in effect (meaning this test can be used) for the duration of the COVID-19 declaration under Section 564(b)(1) of the Act, 21 U.S.C. section 360bbb-3(b)(1), unless the authorization is terminated or revoked.  Performed at [facility], [place] [address], [place], [place] [address]      Labs: BNP (last 3 results) Recent Labs    05/23/21 2000  BNP 762.2*   Basic Metabolic Panel: Recent Labs  Lab 06/17/21 0450 06/19/21 0358 06/21/21 0445 06/23/21 0445  NA 139 137 138 138  K 3.9 3.5 4.1 3.9  CL 109 108 108 108  CO2 _0 GLUCOSE 101* 93 85 83  BUN <5* <5* 5* 8  CREATININE 0.73 0.91 1.01* 1.10*  CALCIUM 8.1* 8.4* 8.6* 8.4*  MG 1.7 1.6* 1.8 1.6*   Liver Function Tests: No results for input(s): AST, ALT, ALKPHOS, BILITOT, PROT, ALBUMIN in the last 168 hours. No results for input(s): LIPASE, AMYLASE in the last 168 hours. No results for input(s): AMMONIA in the last 168 hours. CBC: Recent Labs  Lab 06/17/21 0450 06/19/21 0358 06/21/21 0603 06/23/21 0445  WBC 9.1 9.0 10.0 9.7  HGB 8.6* 8.3* 8.5* 8.3*  HCT 26.8* 26.5* 27.1* 25.6*  MCV 98.9 99.6 99.3 99.6  PLT 161  185 201 194   Cardiac Enzymes: No results for input(s): CKTOTAL, CKMB, CKMBINDEX, TROPONINI in the last 168 hours. BNP: Invalid input(s): POCBNP CBG: Recent Labs  Lab 06/22/21 1203 06/22/21 1715 06/22/21 2149 06/23/21 0744 06/23/21 1124  GLUCAP 175* 230* 127* 99 103*   D-Dimer No results for input(s): DDIMER in the last 72 hours. Hgb A1c No results for input(s): HGBA1C in the last 72 hours. Lipid Profile No results for input(s): CHOL, HDL, LDLCALC, TRIG, CHOLHDL, LDLDIRECT in the last 72 hours. Thyroid function studies No results for input(s): TSH, T4TOTAL, T3FREE, THYROIDAB in the last 72 hours.  Invalid input(s): FREET3 Anemia work up No results for input(s): VITAMINB12, FOLATE, FERRITIN, TIBC, IRON, RETICCTPCT in the last 72 hours. Urinalysis    Component Value Date/Time   COLORURINE YELLOW 05/26/2021 2115   APPEARANCEUR CLEAR 05/26/2021 2115   LABSPEC 1.017 05/26/2021 2115   PHURINE 5.0 05/26/2021 2115   GLUCOSEU 50 (A) 05/26/2021 2115   GLUCOSEU NEGATIVE 07/13/2020 0900   HGBUR SMALL (A) 05/26/2021 2115   BILIRUBINUR NEGATIVE 05/26/2021 2115   KETONESUR NEGATIVE 05/26/2021 2115   PROTEINUR NEGATIVE 05/26/2021 2115   UROBILINOGEN 0.2 07/13/2020 0900  NITRITE NEGATIVE 05/26/2021 2115   LEUKOCYTESUR TRACE (A) 05/26/2021 2115   Sepsis Labs Invalid input(s): PROCALCITONIN,  WBC,  LACTICIDVEN Microbiology Recent Results (from the past 240 hour(s))  Resp Panel by RT-PCR (Flu A&B, Covid) Nasopharyngeal Swab     Status: None   Collection Time: 06/23/21 12:43 PM   Specimen: Nasopharyngeal Swab; Nasopharyngeal(NP) swabs in vial transport medium  Result Value Ref Range Status   SARS Coronavirus 2 by RT PCR NEGATIVE NEGATIVE Final    Comment: (NOTE) SARS-CoV-2 target nucleic acids are NOT DETECTED.  The SARS-CoV-2 RNA is generally detectable in upper respiratory specimens during the acute phase of infection. The lowest concentration of SARS-CoV-2 viral copies this  assay can detect is 138 copies/mL. A negative result does not preclude SARS-Cov-2 infection and should not be used as the sole basis for treatment or other patient management decisions. A negative result may occur with  improper specimen collection/handling, submission of specimen other than nasopharyngeal swab, presence of viral mutation(s) within the areas targeted by this assay, and inadequate number of viral copies(<138 copies/mL). A negative result must be combined with clinical observations, patient history, and epidemiological information. The expected result is Negative.  Fact Sheet for Patients:  EntrepreneurPulse.com.au  Fact Sheet for Healthcare Providers:  IncredibleEmployment.be  This test is no t yet approved or cleared by the [place] FDA and  has been authorized for detection and/or diagnosis of SARS-CoV-2 by FDA under an Emergency Use Authorization (EUA). This EUA will remain  in effect (meaning this test can be used) for the duration of the COVID-19 declaration under Section 564(b)(1) of the Act, 21 U.S.C.section 360bbb-3(b)(1), unless the authorization is terminated  or revoked sooner.       Influenza A by PCR NEGATIVE NEGATIVE Final   Influenza B by PCR NEGATIVE NEGATIVE Final    Comment: (NOTE) The Xpert Xpress SARS-CoV-2/FLU/RSV plus assay is intended as an aid in the diagnosis of influenza from Nasopharyngeal swab specimens and should not be used as a sole basis for treatment. Nasal washings and aspirates are unacceptable for Xpert Xpress SARS-CoV-2/FLU/RSV testing.  Fact Sheet for Patients: EntrepreneurPulse.com.au  Fact Sheet for Healthcare Providers: IncredibleEmployment.be  This test is not yet approved or cleared by the [place] FDA and has been authorized for detection and/or diagnosis of SARS-CoV-2 by FDA under an Emergency Use Authorization (EUA). This EUA will  remain in effect (meaning this test can be used) for the duration of the COVID-19 declaration under Section 564(b)(1) of the Act, 21 U.S.C. section 360bbb-3(b)(1), unless the authorization is terminated or revoked.  Performed at [facility], [place] [address], [place], [place] [address]      Time coordinating discharge: 35 minutes  SIGNED:   Cordelia Poche, MD [facility] 06/23/2021, 2:14 PM

## 2021-06-23 NOTE — TOC Transition Note (Signed)
Transition of Care [facility]) - CM/SW Discharge Note   Patient Details  Name: [name] MRN: [id number] Date of Birth: July 29, 1951  [facility] of [facility]) CM/SW Contact:  Eduard Roux, LCSW Phone Number: 06/23/2021, 2:37 PM   Clinical Narrative:     Patient will Discharge to: [facility] Discharge Date: 06/23/2021 Family Notified: daughter,Whitney Transport By: Sharin Mons  Per MD patient is ready for discharge. RN, patient, and facility notified of discharge. Discharge Summary sent to facility. RN given number for [phone], Room 108. Ambulance transport requested for patient.   Clinical Social Worker signing off.  Antony Blackbird, MSW, LCSW Clinical Social Worker      Final next level of care: Skilled Nursing Facility Barriers to Discharge: Barriers Resolved   Patient Goals and CMS Choice Patient states their goals for this hospitalization and ongoing recovery are:: "I want to walk and be on my own again." CMS Medicare.gov Compare Post Acute Care list provided to:: Patient Choice offered to / list presented to : Patient, Adult Children  Discharge Placement              Patient chooses bed at:  ([facility]) Patient to be transferred to facility by: [facility] Name of family member notified: daugter,Whitney Patient and family notified of of transfer: 06/23/21  Discharge Plan and Services     Post Acute Care Choice: Skilled Nursing Facility                               Social Determinants of Health (SDOH) Interventions     Readmission Risk Interventions No flowsheet data found.

## 2021-06-23 NOTE — Progress Notes (Signed)
Report Called to [facility] RN byt this RN. [facility] at bed side to transport Pt. Pt's daughter notified. IV removed.

## 2021-06-26 ENCOUNTER — Other Ambulatory Visit: Payer: Self-pay | Admitting: Surgery

## 2021-06-26 DIAGNOSIS — E1169 Type 2 diabetes mellitus with other specified complication: Secondary | ICD-10-CM | POA: Diagnosis not present

## 2021-06-26 DIAGNOSIS — R69 Illness, unspecified: Secondary | ICD-10-CM | POA: Diagnosis not present

## 2021-06-26 DIAGNOSIS — K5792 Diverticulitis of intestine, part unspecified, without perforation or abscess without bleeding: Secondary | ICD-10-CM | POA: Diagnosis not present

## 2021-06-26 DIAGNOSIS — Z794 Long term (current) use of insulin: Secondary | ICD-10-CM | POA: Diagnosis not present

## 2021-06-26 DIAGNOSIS — K219 Gastro-esophageal reflux disease without esophagitis: Secondary | ICD-10-CM | POA: Diagnosis not present

## 2021-06-26 DIAGNOSIS — E785 Hyperlipidemia, unspecified: Secondary | ICD-10-CM | POA: Diagnosis not present

## 2021-06-26 DIAGNOSIS — K5720 Diverticulitis of large intestine with perforation and abscess without bleeding: Secondary | ICD-10-CM

## 2021-06-26 DIAGNOSIS — I509 Heart failure, unspecified: Secondary | ICD-10-CM | POA: Diagnosis not present

## 2021-06-26 DIAGNOSIS — E039 Hypothyroidism, unspecified: Secondary | ICD-10-CM | POA: Diagnosis not present

## 2021-06-27 DIAGNOSIS — R5381 Other malaise: Secondary | ICD-10-CM | POA: Diagnosis not present

## 2021-06-27 DIAGNOSIS — K5792 Diverticulitis of intestine, part unspecified, without perforation or abscess without bleeding: Secondary | ICD-10-CM | POA: Diagnosis not present

## 2021-07-01 ENCOUNTER — Inpatient Hospital Stay (HOSPITAL_COMMUNITY)
Admission: EM | Admit: 2021-07-01 | Discharge: 2021-07-12 | DRG: 682 | Disposition: A | Discharge status: Skilled Nursing Facility | Payer: Medicare HMO | Source: Skilled Nursing Facility | Attending: Family Medicine | Admitting: Family Medicine

## 2021-07-01 ENCOUNTER — Emergency Department (HOSPITAL_COMMUNITY): Payer: Medicare HMO

## 2021-07-01 ENCOUNTER — Other Ambulatory Visit: Payer: Self-pay

## 2021-07-01 ENCOUNTER — Encounter (HOSPITAL_COMMUNITY): Payer: Self-pay | Admitting: Emergency Medicine

## 2021-07-01 DIAGNOSIS — Z6841 Body Mass Index (BMI) 40.0 and over, adult: Secondary | ICD-10-CM

## 2021-07-01 DIAGNOSIS — E1122 Type 2 diabetes mellitus with diabetic chronic kidney disease: Secondary | ICD-10-CM | POA: Diagnosis not present

## 2021-07-01 DIAGNOSIS — I700 Atherosclerosis of aorta: Secondary | ICD-10-CM | POA: Diagnosis not present

## 2021-07-01 DIAGNOSIS — R251 Tremor, unspecified: Secondary | ICD-10-CM | POA: Diagnosis not present

## 2021-07-01 DIAGNOSIS — K429 Umbilical hernia without obstruction or gangrene: Secondary | ICD-10-CM | POA: Diagnosis not present

## 2021-07-01 DIAGNOSIS — I2699 Other pulmonary embolism without acute cor pulmonale: Secondary | ICD-10-CM | POA: Diagnosis not present

## 2021-07-01 DIAGNOSIS — R000 Tachycardia, unspecified: Secondary | ICD-10-CM | POA: Diagnosis not present

## 2021-07-01 DIAGNOSIS — R410 Disorientation, unspecified: Secondary | ICD-10-CM | POA: Diagnosis not present

## 2021-07-01 DIAGNOSIS — R531 Weakness: Secondary | ICD-10-CM | POA: Diagnosis not present

## 2021-07-01 DIAGNOSIS — E785 Hyperlipidemia, unspecified: Secondary | ICD-10-CM | POA: Diagnosis present

## 2021-07-01 DIAGNOSIS — K651 Peritoneal abscess: Secondary | ICD-10-CM

## 2021-07-01 DIAGNOSIS — J439 Emphysema, unspecified: Secondary | ICD-10-CM | POA: Diagnosis present

## 2021-07-01 DIAGNOSIS — H0520 Unspecified exophthalmos: Secondary | ICD-10-CM | POA: Diagnosis not present

## 2021-07-01 DIAGNOSIS — Z833 Family history of diabetes mellitus: Secondary | ICD-10-CM

## 2021-07-01 DIAGNOSIS — I5032 Chronic diastolic (congestive) heart failure: Secondary | ICD-10-CM | POA: Diagnosis present

## 2021-07-01 DIAGNOSIS — J4540 Moderate persistent asthma, uncomplicated: Secondary | ICD-10-CM | POA: Diagnosis not present

## 2021-07-01 DIAGNOSIS — Z86711 Personal history of pulmonary embolism: Secondary | ICD-10-CM

## 2021-07-01 DIAGNOSIS — Z86718 Personal history of other venous thrombosis and embolism: Secondary | ICD-10-CM

## 2021-07-01 DIAGNOSIS — M47816 Spondylosis without myelopathy or radiculopathy, lumbar region: Secondary | ICD-10-CM | POA: Diagnosis not present

## 2021-07-01 DIAGNOSIS — E039 Hypothyroidism, unspecified: Secondary | ICD-10-CM | POA: Diagnosis present

## 2021-07-01 DIAGNOSIS — G9341 Metabolic encephalopathy: Secondary | ICD-10-CM | POA: Diagnosis not present

## 2021-07-01 DIAGNOSIS — R569 Unspecified convulsions: Secondary | ICD-10-CM | POA: Diagnosis present

## 2021-07-01 DIAGNOSIS — I130 Hypertensive heart and chronic kidney disease with heart failure and stage 1 through stage 4 chronic kidney disease, or unspecified chronic kidney disease: Secondary | ICD-10-CM | POA: Diagnosis not present

## 2021-07-01 DIAGNOSIS — R188 Other ascites: Secondary | ICD-10-CM

## 2021-07-01 DIAGNOSIS — E860 Dehydration: Secondary | ICD-10-CM | POA: Diagnosis present

## 2021-07-01 DIAGNOSIS — Z8673 Personal history of transient ischemic attack (TIA), and cerebral infarction without residual deficits: Secondary | ICD-10-CM

## 2021-07-01 DIAGNOSIS — E876 Hypokalemia: Secondary | ICD-10-CM | POA: Diagnosis not present

## 2021-07-01 DIAGNOSIS — E872 Acidosis: Secondary | ICD-10-CM | POA: Diagnosis present

## 2021-07-01 DIAGNOSIS — Z79899 Other long term (current) drug therapy: Secondary | ICD-10-CM | POA: Diagnosis not present

## 2021-07-01 DIAGNOSIS — A419 Sepsis, unspecified organism: Secondary | ICD-10-CM | POA: Diagnosis not present

## 2021-07-01 DIAGNOSIS — L89322 Pressure ulcer of left buttock, stage 2: Secondary | ICD-10-CM | POA: Diagnosis not present

## 2021-07-01 DIAGNOSIS — N1831 Chronic kidney disease, stage 3a: Secondary | ICD-10-CM | POA: Diagnosis not present

## 2021-07-01 DIAGNOSIS — G4733 Obstructive sleep apnea (adult) (pediatric): Secondary | ICD-10-CM | POA: Diagnosis present

## 2021-07-01 DIAGNOSIS — I10 Essential (primary) hypertension: Secondary | ICD-10-CM | POA: Diagnosis present

## 2021-07-01 DIAGNOSIS — R0902 Hypoxemia: Secondary | ICD-10-CM | POA: Diagnosis not present

## 2021-07-01 DIAGNOSIS — D649 Anemia, unspecified: Secondary | ICD-10-CM | POA: Diagnosis present

## 2021-07-01 DIAGNOSIS — Z79890 Hormone replacement therapy (postmenopausal): Secondary | ICD-10-CM

## 2021-07-01 DIAGNOSIS — R197 Diarrhea, unspecified: Secondary | ICD-10-CM | POA: Diagnosis not present

## 2021-07-01 DIAGNOSIS — N19 Unspecified kidney failure: Secondary | ICD-10-CM | POA: Diagnosis not present

## 2021-07-01 DIAGNOSIS — Z8349 Family history of other endocrine, nutritional and metabolic diseases: Secondary | ICD-10-CM

## 2021-07-01 DIAGNOSIS — N179 Acute kidney failure, unspecified: Secondary | ICD-10-CM | POA: Diagnosis not present

## 2021-07-01 DIAGNOSIS — I272 Pulmonary hypertension, unspecified: Secondary | ICD-10-CM | POA: Diagnosis not present

## 2021-07-01 DIAGNOSIS — R5383 Other fatigue: Secondary | ICD-10-CM | POA: Diagnosis not present

## 2021-07-01 DIAGNOSIS — L8992 Pressure ulcer of unspecified site, stage 2: Secondary | ICD-10-CM | POA: Diagnosis not present

## 2021-07-01 DIAGNOSIS — Z20822 Contact with and (suspected) exposure to covid-19: Secondary | ICD-10-CM | POA: Diagnosis present

## 2021-07-01 DIAGNOSIS — R404 Transient alteration of awareness: Secondary | ICD-10-CM | POA: Diagnosis not present

## 2021-07-01 DIAGNOSIS — I639 Cerebral infarction, unspecified: Secondary | ICD-10-CM | POA: Diagnosis not present

## 2021-07-01 DIAGNOSIS — L8990 Pressure ulcer of unspecified site, unspecified stage: Secondary | ICD-10-CM | POA: Insufficient documentation

## 2021-07-01 DIAGNOSIS — Z8249 Family history of ischemic heart disease and other diseases of the circulatory system: Secondary | ICD-10-CM

## 2021-07-01 DIAGNOSIS — R6520 Severe sepsis without septic shock: Secondary | ICD-10-CM | POA: Diagnosis not present

## 2021-07-01 DIAGNOSIS — E1165 Type 2 diabetes mellitus with hyperglycemia: Secondary | ICD-10-CM | POA: Diagnosis not present

## 2021-07-01 DIAGNOSIS — K5730 Diverticulosis of large intestine without perforation or abscess without bleeding: Secondary | ICD-10-CM | POA: Diagnosis not present

## 2021-07-01 DIAGNOSIS — Z743 Need for continuous supervision: Secondary | ICD-10-CM | POA: Diagnosis not present

## 2021-07-01 DIAGNOSIS — Z4682 Encounter for fitting and adjustment of non-vascular catheter: Secondary | ICD-10-CM | POA: Diagnosis not present

## 2021-07-01 LAB — CBC WITH DIFFERENTIAL/PLATELET
Abs Immature Granulocytes: 0.03 10*3/uL (ref 0.00–0.07)
Basophils Absolute: 0.1 10*3/uL (ref 0.0–0.1)
Basophils Relative: 1 %
Eosinophils Absolute: 0 10*3/uL (ref 0.0–0.5)
Eosinophils Relative: 0 %
HCT: 29.1 % — ABNORMAL LOW (ref 36.0–46.0)
Hemoglobin: 9 g/dL — ABNORMAL LOW (ref 12.0–15.0)
Immature Granulocytes: 0 %
Lymphocytes Relative: 12 %
Lymphs Abs: 1.2 10*3/uL (ref 0.7–4.0)
MCH: 31.4 pg (ref 26.0–34.0)
MCHC: 30.9 g/dL (ref 30.0–36.0)
MCV: 101.4 fL — ABNORMAL HIGH (ref 80.0–100.0)
Monocytes Absolute: 0.9 10*3/uL (ref 0.1–1.0)
Monocytes Relative: 9 %
Neutro Abs: 7.7 10*3/uL (ref 1.7–7.7)
Neutrophils Relative %: 78 %
Platelets: 212 10*3/uL (ref 150–400)
RBC: 2.87 MIL/uL — ABNORMAL LOW (ref 3.87–5.11)
RDW: 18.6 % — ABNORMAL HIGH (ref 11.5–15.5)
WBC: 10 10*3/uL (ref 4.0–10.5)
nRBC: 0 % (ref 0.0–0.2)

## 2021-07-01 LAB — COMPREHENSIVE METABOLIC PANEL
ALT: 17 U/L (ref 0–44)
AST: 27 U/L (ref 15–41)
Albumin: 2.7 g/dL — ABNORMAL LOW (ref 3.5–5.0)
Alkaline Phosphatase: 58 U/L (ref 38–126)
Anion gap: 10 (ref 5–15)
BUN: 13 mg/dL (ref 8–23)
CO2: 24 mmol/L (ref 22–32)
Calcium: 7.6 mg/dL — ABNORMAL LOW (ref 8.9–10.3)
Chloride: 104 mmol/L (ref 98–111)
Creatinine, Ser: 3.49 mg/dL — ABNORMAL HIGH (ref 0.44–1.00)
GFR, Estimated: 14 mL/min — ABNORMAL LOW (ref >60)
Glucose, Bld: 75 mg/dL (ref 70–99)
Potassium: 3.6 mmol/L (ref 3.5–5.1)
Sodium: 138 mmol/L (ref 135–145)
Total Bilirubin: 0.8 mg/dL (ref 0.3–1.2)
Total Protein: 5.4 g/dL — ABNORMAL LOW (ref 6.5–8.1)

## 2021-07-01 LAB — LACTIC ACID, PLASMA
Lactic Acid, Venous: 2.5 mmol/L (ref 0.5–1.9)
Lactic Acid, Venous: 2.9 mmol/L (ref 0.5–1.9)
Lactic Acid, Venous: 1.3 mmol/L (ref 0.5–1.9)

## 2021-07-01 LAB — PROTIME-INR
INR: 1.3 — ABNORMAL HIGH (ref 0.8–1.2)
Prothrombin Time: 16.5 seconds — ABNORMAL HIGH (ref 11.4–15.2)

## 2021-07-01 LAB — APTT: aPTT: 28 seconds (ref 24–36)

## 2021-07-01 MED ORDER — METRONIDAZOLE 500 MG/100ML IV SOLN
500.0000 mg | Freq: Once | INTRAVENOUS | Status: DC
  Filled 2021-07-01: qty 100

## 2021-07-01 MED ORDER — LACTATED RINGERS IV BOLUS
1000.0000 mL | Freq: Once | INTRAVENOUS | Status: AC
  Administered 2021-07-01: 1000 mL via INTRAVENOUS

## 2021-07-01 MED ORDER — LACTATED RINGERS IV BOLUS (SEPSIS): 1000.0000 mL | Freq: Once | INTRAVENOUS | Status: DC

## 2021-07-01 MED ORDER — SODIUM CHLORIDE 0.9 % IV SOLN: 2.0000 g | INTRAVENOUS | Status: DC

## 2021-07-01 MED ORDER — SODIUM CHLORIDE 0.9 % IV SOLN
2.0000 g | Freq: Once | INTRAVENOUS | Status: AC
  Administered 2021-07-01: 2 g via INTRAVENOUS
  Filled 2021-07-01: qty 2

## 2021-07-01 MED ORDER — LACTATED RINGERS IV BOLUS (SEPSIS): 500.0000 mL | Freq: Once | INTRAVENOUS | Status: DC

## 2021-07-01 MED ORDER — LACTATED RINGERS IV BOLUS (SEPSIS)
1000.0000 mL | Freq: Once | INTRAVENOUS | Status: AC
  Administered 2021-07-01: 1000 mL via INTRAVENOUS

## 2021-07-01 MED ORDER — LACTATED RINGERS IV BOLUS (SEPSIS)
1000.0000 mL | Freq: Once | INTRAVENOUS | Status: AC
Start: 2021-07-01 — End: 2021-07-01
  Administered 2021-07-01: 1000 mL via INTRAVENOUS

## 2021-07-01 MED ORDER — LACTATED RINGERS IV SOLN: INTRAVENOUS | Status: DC

## 2021-07-01 NOTE — ED Provider Notes (Signed)
[facility]-EMERGENCY DEPT Provider Note   CSN: [id number] Arrival date & time: 07/01/21  1420     History Chief Complaint  Patient presents with   Seizures   Nausea    [name] is a 70 y.o. female.  HPI  She presents for evaluation of all confusion and a period of "twitching."  She has also been nauseated and vomited twice during EMS transport here.  She is currently at a rehab facility following hospital discharge, 1 week ago.  Patient feels like she "has a blank," for the episode that occurred today.  Staff at her facility reported that she was talking on the telephone to her friend, and she began to show signs of involuntary muscle movement that they described as twitching.  Other staff were not sure what happened, and no one reported distinct syncope.  Patient does not have a history of seizure disorder.  She is being treated for abdominal wall abscess and currently has a JP drain in place for treatment.  She is also apparently on Levaquin.  With plans to continue that for at least 10 days post drain removal.  Patient had diverticulitis with perforation and subsequent abscess.  Patient reports she has not been very well with eating solid foods, but is tolerating liquids including protein shakes.  ED course was also complicated by bilateral pulmonary emboli.  She has been placed on anticoagulation.  She had a left leg DVT.  She has received a IVC filter.  While initially hypoxic, she has been weaned to room air during the hospitalization.  Of note, the hospitalization was 4 weeks time.  Patient has been undergoing rehab with physical therapy and apparently progressing to be able to walk with a cane.  She has history of Graves' disease and has previously diagnosis of exophthalmos.  Contrary to what patient stated she does have a history of a seizure disorder according to EPIC.     Past Medical History:  Diagnosis Date   Anemia    Celiac disease    CHF  (congestive heart failure) (HCC)    Depression    Diabetes mellitus without complication (HCC)    Edema    lower extremities   Emphysema of lung (HCC)    Enlarged heart    begining stage   Graves disease    Headache(784.0) 05/18/2013   Heart murmur    Hematochezia    Hemorrhoid    Hyperlipidemia    Hypertension    Migraine    Obese    OSA (obstructive sleep apnea)    Seizures (HCC)    Sleep apnea    Stroke (HCC)    left P-O ICH 2007   Thyroid disease    hypothyroidism   Ulcer    Vitamin D deficiency     Patient Active Problem List   Diagnosis Date Noted   Colonic diverticular abscess 05/26/2021   Bilateral pulmonary embolism (HCC) 05/26/2021   Cardiac arrhythmia 05/23/2021   Chronic kidney disease due to hypertension 05/23/2021   Chronic kidney disease, stage 3a (HCC) 05/23/2021   Diabetic renal disease (HCC) 05/23/2021   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   Gastroesophageal reflux disease 05/23/2021   Hyperlipidemia 05/23/2021   Moderate persistent asthma, uncomplicated 05/23/2021   Stroke (HCC) 05/23/2021   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 05/23/2021   Morbid obesity (HCC) 11/07/2020   Optic neuropathy, compressive 08/24/2020   Thyroid eye disease 08/04/2020   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 (HCC) 04/09/2020  Hypertensive retinopathy of both eyes 04/09/2020   Anemia 08/07/2017   Celiac disease 08/07/2017   Graves' disease with exophthalmos 08/07/2017   History of depression 08/07/2017   Diastolic dysfunction 05/25/2013   Headache(784.0) 05/18/2013   Chronic diastolic CHF (congestive heart failure) (HCC) 12/04/2011   Hemorrhoids, internal, with bleeding 11/28/2011   Seizure (HCC) 06/30/2010    Past Surgical History:  Procedure Laterality Date   CESAREAN SECTION     ESOPHAGOGASTRODUODENOSCOPY N/A 06/14/2021   Procedure: ESOPHAGOGASTRODUODENOSCOPY (EGD);  Surgeon: Jeani Hawking, MD;  Location: [facility]  ENDOSCOPY;  Service: Endoscopy;  Laterality: N/A;   ESOPHAGOGASTRODUODENOSCOPY (EGD) WITH PROPOFOL N/A 06/01/2021   Procedure: ESOPHAGOGASTRODUODENOSCOPY (EGD) WITH PROPOFOL;  Surgeon: Jeani Hawking, MD;  Location: [facility] ENDOSCOPY;  Service: Endoscopy;  Laterality: N/A;   HEMORRHOID SURGERY     IR IVC FILTER PLMT / S&I /IMG GUID/MOD SED  06/01/2021   IR SINUS/FIST TUBE CHK-NON GI  06/14/2021   IR US GUIDE VASC ACCESS RIGHT  06/01/2021   IUD REMOVAL     TONSILLECTOMY     VEIN SURGERY       OB History   No obstetric history on file.     Family History  Problem Relation Age of Onset   Heart disease Father    Diabetes Father    Heart failure Father    Diabetes Mother    Thyroid disease Sister    Diabetes Brother     Social History   Tobacco Use   Smoking status: Never   Smokeless tobacco: Never  Vaping Use   Vaping Use: Never used  Substance Use Topics   Alcohol use: No   Drug use: No    Home Medications Prior to Admission medications   Medication Sig Start Date End Date Taking? Authorizing Provider  acetaminophen (TYLENOL) 325 MG tablet Take 650 mg by mouth every 4 (four) hours as needed for fever.   Yes [provider]  albuterol (VENTOLIN HFA) 108 (90 Base) MCG/ACT inhaler Inhale 2 puffs into the lungs every 6 (six) hours as needed for wheezing or shortness of breath.   Yes [provider]  Fluticasone-Salmeterol (ADVAIR) 250-50 MCG/DOSE AEPB Inhale 1 puff into the lungs daily as needed for allergies.   Yes [provider]  furosemide (LASIX) 20 MG tablet Take 1 tablet (20 mg total) by mouth daily. 05/22/21  Yes White, Adrienne R, NP  levofloxacin (LEVAQUIN) 750 MG tablet Take 1 tablet (750 mg total) by mouth daily. Patient taking differently: Take 750 mg by mouth daily. X 10 day starting 07/09 - 07/19 06/24/21  Yes Narda Bonds, MD  levothyroxine (SYNTHROID) 112 MCG tablet Take 1 tablet by mouth Monday through Saturday and 1/2 tablet on  Sunday. Patient taking differently: Take 112 mcg by mouth See admin instructions. Take 1 tablet by mouth Monday through Saturday and 1/2 tablet on Sunday. 11/08/20  Yes Reather Littler, MD  meclizine (ANTIVERT) 25 MG tablet Take 1 tablet by mouth daily as needed for dizziness.   Yes [provider]  metFORMIN (GLUCOPHAGE) 500 MG tablet Take 500 mg by mouth 2 (two) times daily. 08/23/20  Yes [provider]  mirtazapine (REMERON) 7.5 MG tablet Take 1 tablet (7.5 mg total) by mouth at bedtime. 06/23/21  Yes Narda Bonds, MD  ondansetron (ZOFRAN) 4 MG tablet Take 4 mg by mouth every 8 (eight) hours as needed for nausea or vomiting.   Yes [provider]  OVER THE COUNTER MEDICATION Take 1 tablet by mouth  daily. Medication: Vitamin b12 3000 mcg   Yes [provider]  pantoprazole (PROTONIX) 40 MG tablet Take 40 mg by mouth 2 (two) times daily.   Yes [provider]  rosuvastatin (CRESTOR) 5 MG tablet Take 1 tablet (5 mg total) by mouth daily. 11/08/20  Yes Nahser, Deloris Ping, MD  saccharomyces boulardii (FLORASTOR) 250 MG capsule Take 1 capsule (250 mg total) by mouth 2 (two) times daily. 06/23/21  Yes Narda Bonds, MD  feeding supplement (ENSURE ENLIVE / ENSURE PLUS) LIQD Take 237 mLs by mouth 2 (two) times daily between meals. Patient not taking: Reported on 07/01/2021 06/23/21   Narda Bonds, MD  glucose blood (ONETOUCH VERIO) test strip Use as intstructed to check blood sugar once a day Dx Code E11.9 Patient not taking: Reported on 07/01/2021 02/18/21   Reather Littler, MD  Lancets [facility] ULTRASOFT) lancets Use as instructed Patient not taking: Reported on 07/01/2021 10/13/20   Reather Littler, MD  pantoprazole (PROTONIX) 40 MG tablet Take 1 tablet (40 mg total) by mouth 2 (two) times daily before a meal. Patient not taking: Reported on 07/01/2021 06/23/21   Narda Bonds, MD    Allergies    Gluten meal  Review of Systems   Review of Systems  All other systems  reviewed and are negative.  Physical Exam Updated Vital Signs BP 116/74   Pulse 85   Temp 99.8 F (37.7 C) (Rectal)   Resp 18   SpO2 100%   Physical Exam Vitals and nursing note reviewed.  Constitutional:      General: She is not in acute distress.    Appearance: She is well-developed. She is obese. She is not ill-appearing, toxic-appearing or diaphoretic.  HENT:     Head: Normocephalic and atraumatic.     Right Ear: External ear normal.     Left Ear: External ear normal.     Nose: No congestion.     Mouth/Throat:     Mouth: Mucous membranes are moist.     Pharynx: No oropharyngeal exudate or posterior oropharyngeal erythema.  Eyes:     Conjunctiva/sclera: Conjunctivae normal.     Pupils: Pupils are equal, round, and reactive to light.     Comments: Exophthalmos present  Neck:     Trachea: Phonation normal.  Cardiovascular:     Rate and Rhythm: Normal rate and regular rhythm.     Heart sounds: Normal heart sounds.  Pulmonary:     Effort: Pulmonary effort is normal.     Breath sounds: Normal breath sounds.  Abdominal:     General: There is no distension.     Palpations: Abdomen is soft.     Tenderness: There is no abdominal tenderness.  Musculoskeletal:        General: Normal range of motion.     Cervical back: Normal range of motion and neck supple.     Comments: Normal strength arms legs bilaterally.  2+ pitting edema lower legs bilaterally.  Skin:    General: Skin is warm and dry.  Neurological:     Mental Status: She is alert and oriented to person, place, and time.     Cranial Nerves: No cranial nerve deficit.     Sensory: No sensory deficit.     Motor: No abnormal muscle tone.     Coordination: Coordination normal.     Comments: No dysarthria, aphasia or nystagmus.  Psychiatric:        Mood and Affect: Mood normal.  Behavior: Behavior normal.        Thought Content: Thought content normal.        Judgment: Judgment normal.    ED Results /  Procedures / Treatments   Labs (all labs ordered are listed, but only abnormal results are displayed) Labs Reviewed  LACTIC ACID, PLASMA - Abnormal; Notable for the following components:      Result Value   Lactic Acid, Venous 2.5 (*)    All other components within normal limits  LACTIC ACID, PLASMA - Abnormal; Notable for the following components:   Lactic Acid, Venous 2.9 (*)    All other components within normal limits  COMPREHENSIVE METABOLIC PANEL - Abnormal; Notable for the following components:   Creatinine, Ser 3.49 (*)    Calcium 7.6 (*)    Total Protein 5.4 (*)    Albumin 2.7 (*)    GFR, Estimated 14 (*)    All other components within normal limits  CBC WITH DIFFERENTIAL/PLATELET - Abnormal; Notable for the following components:   RBC 2.87 (*)    Hemoglobin 9.0 (*)    HCT 29.1 (*)    MCV 101.4 (*)    RDW 18.6 (*)    All other components within normal limits  PROTIME-INR - Abnormal; Notable for the following components:   Prothrombin Time 16.5 (*)    INR 1.3 (*)    All other components within normal limits  CULTURE, BLOOD (SINGLE)  CULTURE, BLOOD (SINGLE)  SARS CORONAVIRUS 2 (TAT 6-24 HRS)  APTT  URINALYSIS, ROUTINE W REFLEX MICROSCOPIC    EKG EKG Interpretation  Date/Time:  Saturday July 01 2021 16:19:55 EDT Ventricular Rate:  93 PR Interval:  165 QRS Duration: 115 QT Interval:  401 QTC Calculation: 499 R Axis:   -32 Text Interpretation: Sinus rhythm Probable left atrial enlargement Left ventricular hypertrophy ST elevation, consider inferior injury Borderline prolonged QT interval Since last tracing QT has lengthened Otherwise no significant change Confirmed by Mancel Bale [id number]) on 07/01/2021 4:28:01 PM  Radiology DG Chest Port 1 View  Result Date: 07/01/2021 CLINICAL DATA:  Confusion, sepsis, tachycardia EXAM: PORTABLE CHEST 1 VIEW COMPARISON:  05/26/2021 FINDINGS: Single frontal view of the chest demonstrates a stable cardiac silhouette. Chronic  prominence of the central pulmonary vasculature. No airspace disease, effusion, or pneumothorax. No acute bony abnormalities. IMPRESSION: 1. Stable chest, no acute process. Electronically Signed   By: Sharlet Salina M.D.   On: 07/01/2021 16:09    Procedures .Critical Care  Date/Time: 07/01/2021 11:08 PM Performed by: Mancel Bale, MD Authorized by: Mancel Bale, MD   Critical care provider statement:    Critical care time (minutes):  50   Critical care start time:  07/01/2021 3:01 PM   Critical care end time:  07/01/2021 11:08 PM   Critical care time was exclusive of:  Separately billable procedures and treating other patients   Critical care was necessary to treat or prevent imminent or life-threatening deterioration of the following conditions:  Sepsis   Critical care was time spent personally by me on the following activities:  Blood draw for specimens, development of treatment plan with patient or surrogate, discussions with consultants, evaluation of patient's response to treatment, examination of patient, obtaining history from patient or surrogate, ordering and performing treatments and interventions, ordering and review of laboratory studies, pulse oximetry, re-evaluation of patient's condition, review of old charts and ordering and review of radiographic studies   Medications Ordered in ED Medications  lactated ringers infusion (has no administration in  time range)  lactated ringers bolus 1,000 mL (has no administration in time range)    And  lactated ringers bolus 1,000 mL (has no administration in time range)    And  lactated ringers bolus 1,000 mL (has no administration in time range)    And  lactated ringers bolus 500 mL (has no administration in time range)  metroNIDAZOLE (FLAGYL) IVPB 500 mg (has no administration in time range)  lactated ringers bolus 1,000 mL (0 mLs Intravenous Stopped 07/01/21 1556)  lactated ringers bolus 1,000 mL (1,000 mLs Intravenous New Bag/Given  07/01/21 1912)    ED Course  I have reviewed the triage vital signs and the nursing notes.  Pertinent labs & imaging results that were available during my care of the patient were reviewed by me and considered in my medical decision making (see chart for details).    MDM Rules/Calculators/A&P                           Patient Vitals for the past 24 hrs:  BP Temp Temp src Pulse Resp SpO2  07/01/21 2045 116/74 -- -- 85 18 100 %  07/01/21 2030 98/74 -- -- 81 18 99 %  07/01/21 1954 (!) 103/59 -- -- 85 18 100 %  07/01/21 1930 110/71 -- -- -- (!) 22 100 %  07/01/21 1909 113/83 -- -- 92 19 100 %  07/01/21 1815 -- -- -- -- 17 --  07/01/21 1800 (!) 91/58 -- -- 82 (!) 21 100 %  07/01/21 1745 110/83 -- -- 86 17 99 %  07/01/21 1730 100/61 -- -- 92 19 100 %  07/01/21 1715 94/70 -- -- 86 15 100 %  07/01/21 1700 109/83 -- -- 87 17 98 %  07/01/21 1650 -- 99.8 F (37.7 C) Rectal -- -- --  07/01/21 1645 116/89 -- -- 88 16 98 %  07/01/21 1639 (!) 123/91 -- -- 88 (!) 21 99 %  07/01/21 1630 -- -- -- 87 18 98 %  07/01/21 1615 -- -- -- 88 15 98 %  07/01/21 1600 -- -- -- 86 (!) 30 95 %  07/01/21 1545 -- -- -- 92 (!) 25 98 %  07/01/21 1530 93/76 -- -- 90 19 97 %  07/01/21 1515 -- -- -- 92 17 100 %  07/01/21 1500 100/71 -- -- 93 18 96 %  07/01/21 1456 96/73 98.1 F (36.7 C) Oral 93 17 97 %    11:09 PM Reevaluation with update and discussion. After initial assessment and treatment, an updated evaluation reveals she remains alert and comfortable.  Findings discussed with patient, daughter, all questions answered. Mancel BaleElliott Zykiria Bruening   Medical Decision Making:  This patient is presenting for evaluation of episode of involuntary muscle movement, and confusion, which does require a range of treatment options, and is a complaint that involves a high risk of morbidity and mortality. The differential diagnoses include seizure, syncope, cardiac arrhythmia, metabolic disorder. I decided to review old records, and  in summary elderly female currently in rehab following hospitalization and treatment for diverticulitis with perforation, subsequently required drainage procedure and has a persistent JP drain in place.  She has a remote history of seizures, 10 years ago but is not currently taking antiviral medication.  Patient and daughter cannot recall however having had a seizure..  I obtained additional historical information from daughter at bedside.  Clinical Laboratory Tests Ordered, included CBC, Metabolic panel, Urinalysis, and lactate, blood cultures .  Review indicates normal except creatinine high, calcium low, total protein low, albumin low, lactate elevated, hemoglobin low. Radiologic Tests Ordered, included chest x-ray.  I independently Visualized: Radiographic images, which show no acute abnormalities  Cardiac Monitor Tracing which shows normal sinus rhythm     Critical Interventions-clinical evaluation, laboratory testing, IV fluids, repeat IV fluids, empiric antibiotics, CT abdomen pelvis, observation reassessment  After These Interventions, the Patient was reevaluated and was found with signs and symptoms of sepsis, likely intra-abdominal, possibly related to prior diagnosis of diverticulitis with abscess.  CT abdomen pelvis today does not indicate acute abnormalities.  Patient with significant kidney injury likely associated with poor oral intake.  She will require hospitalization for stabilization.  Empiric antibiotics ordered for lactate which is climbing.  This may be secondary to dehydration and not actually sepsis.  Patient covered appropriately for sepsis with IV fluid boluses, and empiric antibiotic.  CRITICAL CARE-yes Performed by: Mancel Bale  Nursing Notes Reviewed/ Care Coordinated Applicable Imaging Reviewed Interpretation of Laboratory Data incorporated into ED treatment  10:55 PM-Consult complete with hospitalist. Patient case explained and discussed.  He agrees to admit  patient for further evaluation and treatment. Call ended at 10:07 PM     Final Clinical Impression(s) / ED Diagnoses Final diagnoses:  Acute kidney injury (HCC)  Intra-abdominal abscess (HCC)  Sepsis with acute renal failure without septic shock, due to unspecified organism, unspecified acute renal failure type [facility])    Rx / DC Orders ED Discharge Orders     None        Mancel Bale, MD 07/01/21 2309

## 2021-07-01 NOTE — ED Triage Notes (Signed)
Patient presents from her facility due to twitching while talking on the phone. Patient is also confused per facility. They believe she had a seizure. Patient has a urinary catheter. She is warm to the touch, tachycardic and nauseous. EMS administered 4mg  Zofran and fluid.   EMS vitals: 114/66 BP 104 HR 20 RR 94% SPO2 105 GCS

## 2021-07-01 NOTE — Progress Notes (Signed)
Pharmacy Antibiotic Note  [name] is a 70 y.o. female  presents on 07/01/21 for evaluation of all confusion and a period of "twitching."Pt with recent admission for treatment of diverticulitis with perforation, subsequently required drainage procedure and has a persistent JP drain in place.    Pharmacy has been consulted to dose cefepime for intra-abdominal infection  Plan: Cefepime 2gm IV q24h Follow renal function and clinical course     Temp (24hrs), Avg:99 F (37.2 C), Min:98.1 F (36.7 C), Max:99.8 F (37.7 C)  Recent Labs  Lab 07/01/21 1508 07/01/21 1827  WBC 10.0  --   CREATININE 3.49*  --   LATICACIDVEN 2.5* 2.9*    CrCl cannot be calculated (Unknown ideal weight.).    Allergies  Allergen Reactions   Gluten Meal     Celiac    Antimicrobials this admission: 7/16 cefepime >> 7/16 flagyl x1  Dose adjustments this admission:   Microbiology results: 7/16 BCx:   Thank you for allowing pharmacy to be a part of this patient's care.  Arley Phenix RPh 07/01/2021, 10:25 PM

## 2021-07-01 NOTE — ED Notes (Signed)
Staff unable to obtain Blood cultures. Phlebotomy will try as soon as they are able.

## 2021-07-01 NOTE — Sepsis Progress Note (Signed)
Code Sepsis initiated @ 2124 PM, ELINK following.

## 2021-07-02 ENCOUNTER — Inpatient Hospital Stay (HOSPITAL_COMMUNITY): Payer: Medicare HMO

## 2021-07-02 DIAGNOSIS — N179 Acute kidney failure, unspecified: Principal | ICD-10-CM

## 2021-07-02 DIAGNOSIS — E872 Acidosis, unspecified: Secondary | ICD-10-CM | POA: Insufficient documentation

## 2021-07-02 DIAGNOSIS — I272 Pulmonary hypertension, unspecified: Secondary | ICD-10-CM

## 2021-07-02 DIAGNOSIS — R197 Diarrhea, unspecified: Secondary | ICD-10-CM

## 2021-07-02 DIAGNOSIS — E1165 Type 2 diabetes mellitus with hyperglycemia: Secondary | ICD-10-CM

## 2021-07-02 DIAGNOSIS — D649 Anemia, unspecified: Secondary | ICD-10-CM

## 2021-07-02 DIAGNOSIS — N1831 Chronic kidney disease, stage 3a: Secondary | ICD-10-CM

## 2021-07-02 DIAGNOSIS — I5032 Chronic diastolic (congestive) heart failure: Secondary | ICD-10-CM | POA: Diagnosis not present

## 2021-07-02 DIAGNOSIS — G9341 Metabolic encephalopathy: Secondary | ICD-10-CM | POA: Diagnosis present

## 2021-07-02 DIAGNOSIS — I2699 Other pulmonary embolism without acute cor pulmonale: Secondary | ICD-10-CM

## 2021-07-02 DIAGNOSIS — R531 Weakness: Secondary | ICD-10-CM

## 2021-07-02 DIAGNOSIS — L8992 Pressure ulcer of unspecified site, stage 2: Secondary | ICD-10-CM

## 2021-07-02 DIAGNOSIS — L8990 Pressure ulcer of unspecified site, unspecified stage: Secondary | ICD-10-CM | POA: Insufficient documentation

## 2021-07-02 DIAGNOSIS — I10 Essential (primary) hypertension: Secondary | ICD-10-CM | POA: Diagnosis present

## 2021-07-02 DIAGNOSIS — R251 Tremor, unspecified: Secondary | ICD-10-CM

## 2021-07-02 DIAGNOSIS — Z8719 Personal history of other diseases of the digestive system: Secondary | ICD-10-CM

## 2021-07-02 DIAGNOSIS — E039 Hypothyroidism, unspecified: Secondary | ICD-10-CM | POA: Diagnosis present

## 2021-07-02 LAB — GLUCOSE, CAPILLARY
Glucose-Capillary: 62 mg/dL — ABNORMAL LOW (ref 70–99)
Glucose-Capillary: 68 mg/dL — ABNORMAL LOW (ref 70–99)
Glucose-Capillary: 71 mg/dL (ref 70–99)
Glucose-Capillary: 71 mg/dL (ref 70–99)
Glucose-Capillary: 80 mg/dL (ref 70–99)

## 2021-07-02 LAB — CBC WITH DIFFERENTIAL/PLATELET
Abs Immature Granulocytes: 0.04 10*3/uL (ref 0.00–0.07)
Basophils Absolute: 0 10*3/uL (ref 0.0–0.1)
Basophils Relative: 0 %
Eosinophils Absolute: 0.1 10*3/uL (ref 0.0–0.5)
Eosinophils Relative: 2 %
HCT: 26.1 % — ABNORMAL LOW (ref 36.0–46.0)
Hemoglobin: 8.2 g/dL — ABNORMAL LOW (ref 12.0–15.0)
Immature Granulocytes: 0 %
Lymphocytes Relative: 16 %
Lymphs Abs: 1.5 10*3/uL (ref 0.7–4.0)
MCH: 31.5 pg (ref 26.0–34.0)
MCHC: 31.4 g/dL (ref 30.0–36.0)
MCV: 100.4 fL — ABNORMAL HIGH (ref 80.0–100.0)
Monocytes Absolute: 1 10*3/uL (ref 0.1–1.0)
Monocytes Relative: 11 %
Neutro Abs: 6.6 10*3/uL (ref 1.7–7.7)
Neutrophils Relative %: 71 %
Platelets: 183 10*3/uL (ref 150–400)
RBC: 2.6 MIL/uL — ABNORMAL LOW (ref 3.87–5.11)
RDW: 18.4 % — ABNORMAL HIGH (ref 11.5–15.5)
WBC: 9.4 10*3/uL (ref 4.0–10.5)
nRBC: 0 % (ref 0.0–0.2)

## 2021-07-02 LAB — COMPREHENSIVE METABOLIC PANEL
ALT: 15 U/L (ref 0–44)
AST: 21 U/L (ref 15–41)
Albumin: 2.5 g/dL — ABNORMAL LOW (ref 3.5–5.0)
Alkaline Phosphatase: 50 U/L (ref 38–126)
Anion gap: 12 (ref 5–15)
BUN: 14 mg/dL (ref 8–23)
CO2: 23 mmol/L (ref 22–32)
Calcium: 7.7 mg/dL — ABNORMAL LOW (ref 8.9–10.3)
Chloride: 104 mmol/L (ref 98–111)
Creatinine, Ser: 2.97 mg/dL — ABNORMAL HIGH (ref 0.44–1.00)
GFR, Estimated: 16 mL/min — ABNORMAL LOW (ref >60)
Glucose, Bld: 57 mg/dL — ABNORMAL LOW (ref 70–99)
Potassium: 3.4 mmol/L — ABNORMAL LOW (ref 3.5–5.1)
Sodium: 139 mmol/L (ref 135–145)
Total Bilirubin: 0.9 mg/dL (ref 0.3–1.2)
Total Protein: 4.8 g/dL — ABNORMAL LOW (ref 6.5–8.1)

## 2021-07-02 LAB — HEMOGLOBIN A1C
Hgb A1c MFr Bld: 5.5 % (ref 4.8–5.6)
Mean Plasma Glucose: 111.15 mg/dL

## 2021-07-02 LAB — FOLATE: Folate: 13.5 ng/mL (ref >5.9)

## 2021-07-02 LAB — VITAMIN B12: Vitamin B-12: 2603 pg/mL — ABNORMAL HIGH (ref 180–914)

## 2021-07-02 LAB — MRSA NEXT GEN BY PCR, NASAL: MRSA by PCR Next Gen: NOT DETECTED

## 2021-07-02 LAB — MAGNESIUM: Magnesium: 1 mg/dL — ABNORMAL LOW (ref 1.7–2.4)

## 2021-07-02 LAB — SARS CORONAVIRUS 2 (TAT 6-24 HRS): SARS Coronavirus 2: NEGATIVE

## 2021-07-02 LAB — C-REACTIVE PROTEIN: CRP: 8.4 mg/dL — ABNORMAL HIGH (ref <1.0)

## 2021-07-02 LAB — SODIUM, URINE, RANDOM: Sodium, Ur: 61 mmol/L

## 2021-07-02 LAB — TSH: TSH: 5.312 u[IU]/mL — ABNORMAL HIGH (ref 0.350–4.500)

## 2021-07-02 LAB — CREATININE, URINE, RANDOM: Creatinine, Urine: 107.55 mg/dL

## 2021-07-02 LAB — PROCALCITONIN: Procalcitonin: <0.1 ng/mL

## 2021-07-02 MED ORDER — DEXTROSE 50 % IV SOLN
INTRAVENOUS | Status: AC
  Filled 2021-07-02: qty 50

## 2021-07-02 MED ORDER — POTASSIUM CHLORIDE 2 MEQ/ML IV SOLN
INTRAVENOUS | Status: DC
  Filled 2021-07-02 (×2): qty 1000

## 2021-07-02 MED ORDER — INSULIN ASPART 100 UNIT/ML IJ SOLN: 0.0000 [IU] | Freq: Every day | INTRAMUSCULAR | Status: DC

## 2021-07-02 MED ORDER — ONDANSETRON HCL 4 MG/2ML IJ SOLN
4.0000 mg | Freq: Four times a day (QID) | INTRAMUSCULAR | Status: DC | PRN
  Administered 2021-07-04: 4 mg via INTRAVENOUS
  Filled 2021-07-02: qty 2

## 2021-07-02 MED ORDER — LEVOFLOXACIN 750 MG PO TABS: 750.0000 mg | ORAL_TABLET | Freq: Every day | ORAL | Status: DC

## 2021-07-02 MED ORDER — MAGNESIUM SULFATE 4 GM/100ML IV SOLN
4.0000 g | Freq: Once | INTRAVENOUS | Status: AC
  Administered 2021-07-02: 4 g via INTRAVENOUS
  Filled 2021-07-02: qty 100

## 2021-07-02 MED ORDER — FLUTICASONE FUROATE-VILANTEROL 200-25 MCG/INH IN AEPB
1.0000 | INHALATION_SPRAY | Freq: Every day | RESPIRATORY_TRACT | Status: DC
  Administered 2021-07-04 – 2021-07-12 (×7): 1 via RESPIRATORY_TRACT
  Filled 2021-07-02 (×2): qty 28

## 2021-07-02 MED ORDER — ONDANSETRON HCL 4 MG PO TABS: 4.0000 mg | ORAL_TABLET | Freq: Four times a day (QID) | ORAL | Status: DC | PRN

## 2021-07-02 MED ORDER — POTASSIUM CHLORIDE CRYS ER 20 MEQ PO TBCR
20.0000 meq | EXTENDED_RELEASE_TABLET | Freq: Once | ORAL | Status: AC
  Administered 2021-07-02: 20 meq via ORAL
  Filled 2021-07-02: qty 1

## 2021-07-02 MED ORDER — ALBUTEROL SULFATE (2.5 MG/3ML) 0.083% IN NEBU: 3.0000 mL | INHALATION_SOLUTION | Freq: Four times a day (QID) | RESPIRATORY_TRACT | Status: DC | PRN

## 2021-07-02 MED ORDER — ACETAMINOPHEN 650 MG RE SUPP: 650.0000 mg | Freq: Four times a day (QID) | RECTAL | Status: DC | PRN

## 2021-07-02 MED ORDER — LACTATED RINGERS IV SOLN: INTRAVENOUS | Status: DC

## 2021-07-02 MED ORDER — PANTOPRAZOLE SODIUM 40 MG IV SOLR
40.0000 mg | Freq: Two times a day (BID) | INTRAVENOUS | Status: DC
  Administered 2021-07-02 – 2021-07-06 (×9): 40 mg via INTRAVENOUS
  Filled 2021-07-02 (×9): qty 40

## 2021-07-02 MED ORDER — ACETAMINOPHEN 325 MG PO TABS: 650.0000 mg | ORAL_TABLET | Freq: Four times a day (QID) | ORAL | Status: DC | PRN

## 2021-07-02 MED ORDER — INSULIN ASPART 100 UNIT/ML IJ SOLN: 0.0000 [IU] | Freq: Three times a day (TID) | INTRAMUSCULAR | Status: DC

## 2021-07-02 MED ORDER — KCL-LACTATED RINGERS 20 MEQ/L IV SOLN
INTRAVENOUS | Status: DC
  Filled 2021-07-02 (×2): qty 1000

## 2021-07-02 MED ORDER — POTASSIUM CHLORIDE 2 MEQ/ML IV SOLN
INTRAVENOUS | Status: DC
  Filled 2021-07-02: qty 1000

## 2021-07-02 MED ORDER — LEVOTHYROXINE SODIUM 112 MCG PO TABS
112.0000 ug | ORAL_TABLET | Freq: Every day | ORAL | Status: DC
  Administered 2021-07-02 – 2021-07-06 (×5): 112 ug via ORAL
  Filled 2021-07-02 (×5): qty 1

## 2021-07-02 MED ORDER — POTASSIUM CHLORIDE 10 MEQ/100ML IV SOLN
10.0000 meq | INTRAVENOUS | Status: DC
  Administered 2021-07-02: 10 meq via INTRAVENOUS
  Filled 2021-07-02: qty 100

## 2021-07-02 NOTE — Sepsis Progress Note (Signed)
Code Sepsis Completion Note:  Earlier asked MD for a repeat order for LA since the first two were drawn before the Code Sepsis was initiated. LA was down to 1.3. Pt had ABX and fluid replacement per last recorded weight. Being admitted to Telemetry/Medical unit for further monitoring.   Billy Turvey DNP Pola Corn RN [phone] AM

## 2021-07-02 NOTE — Progress Notes (Signed)
PROGRESS NOTE  [name] ZOX:[id number] DOB: [birth year]   PCP: Ollen Bowl, MD  Patient is from: SNF  DOA: 07/01/2021 LOS: 1  Chief complaints:  Chief Complaint  Patient presents with   Seizures   Nausea     Brief Narrative / Interim history: 70 year old F with PMH of CKD-3A, DM-2, HTN, HLD, diastolic CHF, recent hospitalization at [facility] from 6/10-7/8 for perforated diverticulitis with abscess returning with lethargy, generalized weakness, diarrhea, poor p.o. intake and shaking episode and admitted AKI, acute metabolic encephalopathy and possible seizure.   CT abdomen and pelvis, CT head and renal ultrasound without significant finding.  Patient hospitalized at [facility] 6/10-7/8 for perforated diverticulitis with pelvic abscess that was treated with IR drain placement on 6/21.  Hospital course complicated by bilateral PE and GI bleed without clear source after EGD.  She had IVC placed and taken off [facility] and discharged to SNF.  Patient was started on IV fluids.  Cultures of C. difficile obtained.  EEG ordered  Subjective: Seen and examined earlier this morning.  No major events overnight of this morning.  Significant improvement in his symptoms and mental status.  She has no complaints.  She denies chest pain, dyspnea, nausea, vomiting or abdominal pain.  Still with some diarrhea.  Denies UTI symptoms.  Patient's daughter at bedside.   Objective: Vitals:   07/02/21 0835 07/02/21 0900 07/02/21 1200 07/02/21 1216  BP: 108/65   104/73  Pulse: 88   89  Resp: Temp: 98.1 F (36.7 C)   98.4 F (36.9 C)  TempSrc: Oral   Oral  SpO2: 100%   92%    Intake/Output Summary (Last 24 hours) at 07/02/2021 1442 Last data filed at 07/01/2021 2326 Gross per 24 hour  Intake 4100 ml  Output --  Net 4100 ml   There were no vitals filed for this visit.  Examination:  GENERAL: No apparent distress.  Nontoxic. HEENT: MMM.  Vision and hearing grossly intact.  NECK: Supple.   No apparent JVD.  RESP: No IWOB.  Fair aeration bilaterally. CVS:  RRR. Heart sounds normal.  ABD/GI/GU: BS+. Abd soft, NTND.  MSK/EXT:  Moves extremities. No apparent deformity. No edema.  SKIN: no apparent skin lesion or wound NEURO: Awake, alert and oriented appropriately.  No apparent focal neuro deficit. PSYCH: Calm. Normal affect.   Procedures:  None  Microbiology summarized: COVID-19 PCR nonreactive. Blood culture pending C. difficile screen pending  Assessment & Plan: AKI: Likely in the setting of poor p.o. intake and diarrhea although she has no azotemia.  She is also on Lasix and Protonix which might contribute.  CT abdomen and pelvis and renal US without significant finding.  Improving with IV fluid. Recent Labs    06/13/21 0627 06/14/21 8119 06/15/21 0500 06/16/21 0253 06/17/21 0450 06/19/21 0358 06/21/21 0445 06/23/21 0445 07/01/21 1508 07/02/21 0615  BUN 6* 16 11 6* <5* <5* 5* CREATININE 0.90 0.98 0.76 0.84 0.73 0.91 1.01* 1.10* 3.49* 2.97*  -Continue IV fluid -Continue holding Lasix -Recheck renal function in the morning  Acute metabolic encephalopathy: Likely due to dehydration, AKI and possibly Levaquin.  Seems to have resolved.  No focal neurodeficit.  CT head reassuring.  Awake and alert.  She is oriented to self, place and family. -Minimize or avoid sedating medications -Reorientation and delirium precautions -Follow EEG  History of perforated diverticulitis with pelvic abscess-CT abdomen and pelvis with complete evacuation of previously noted pelvic abscess.  Patient was previously discharged on p.o. Levaquin through 7/19.  -Discontinue Levaquin -IR consulted-appreciate recommendations  Diarrhea: Unclear source.  Abdominal exam benign.  However, need to rule out C. difficile given recent hospitalization and antibiotic -Follow C. 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  No cardiopulmonary symptoms.  Uncontrolled NIDDM-2: A1c 9.4% on 04/22/2021.  Takes metformin at home -Start CBG monitoring and SSI -Check hemoglobin A1c in the morning -Continue holding metformin   Chronic diastolic CHF/severe PAH: TTE ordered 6/11 with LVEF of 65 to 70%, indeterminate DD, severely elevated PASP and RVSP of 65 mmHg, mild to mod AS.  Patient is on low-dose Lasix -Continue holding Lasix -Continue IV fluid -Closely monitor fluid and respiratory status while on IV fluid  Shaking episode -Follow EEG  Lactic acidosis: Likely due to dehydration, metformin and slow clearance in the setting of AKI.  Resolved.  Moderate persistent asthma: Stable. -Continue home breathing treatments  OSA -Nightly CPAP  Hypothyroidism -Continue home Synthroid  Hypokalemia/hypomagnesemia: K3.4.  Mg 1.0. -K-Dur to 20 mill equivalent x1 given renal failure -Mg 4 g x 1  Normocytic anemia: H&H stable. History of GI bleed-recent EGD unrevealing. Recent Labs    06/15/21 0045 06/15/21 0500 06/15/21 1810 06/16/21 0253 06/17/21 0450 06/19/21 0358 06/21/21 0603 06/23/21 0445 07/01/21 1508 07/02/21 0615  HGB 8.5* 8.4* 9.1* 8.5* 8.6* 8.3* 8.5* 8.3* 9.0* 8.2*  -Continue monitoring   Debility/generalized weakness -PT/OT    There is no height or weight on file to calculate BMI.       Pressure skin injuries: POA Pressure Injury 07/02/21 Buttocks Left Stage 2 -  Partial thickness loss of dermis presenting as a shallow open injury with a red, pink wound bed without slough. (Active)  07/02/21 0923  Location: Buttocks  Location Orientation: Left  Staging: Stage 2 -  Partial thickness loss of dermis presenting as a shallow open injury with a red, pink wound bed without slough.  Wound Description (Comments):   Present on Admission: Yes   DVT prophylaxis:  SCDs Start: 07/02/21 0029  Code Status: Full code.  Confirmed with patient and patient's daughter at  bedside Family Communication: Updated patient's daughter at bedside Level of care: Telemetry Status is: Inpatient  Remains inpatient appropriate because:Persistent severe electrolyte disturbances, Altered mental status, Unsafe d/c plan, IV treatments appropriate due to intensity of illness or inability to take PO, and Inpatient level of care appropriate due to severity of illness  Dispo: The patient is from: SNF              Anticipated d/c is to: SNF              Patient currently is not medically stable to d/c.   Difficult to place patient No       Consultants:  IR   Sch Meds:  Scheduled Meds:  fluticasone furoate-vilanterol  1 puff Inhalation Daily   insulin aspart  0-5 Units Subcutaneous QHS   insulin aspart  0-6 Units Subcutaneous TID WC   levothyroxine  112 mcg Oral Q0600   pantoprazole (PROTONIX) IV  40 mg Intravenous Q12H   potassium chloride  20 mEq Oral Once   Continuous Infusions:  lactated ringers with kcl 125 mL/hr at 07/02/21 1307   PRN Meds:.acetaminophen **OR** acetaminophen, albuterol, ondansetron **OR** ondansetron (ZOFRAN) IV  Antimicrobials: Anti-infectives (From admission, onward)    Start     Dose/Rate Route Frequency Ordered Stop  07/02/21 2200  ceFEPIme (MAXIPIME) 2 g in sodium chloride 0.9 % 100 mL IVPB  Status:  Discontinued        2 g 200 mL/hr over 30 Minutes Intravenous Every 24 hours 07/01/21 2358 07/02/21 0003   07/02/21 1000  levofloxacin (LEVAQUIN) tablet 750 mg  Status:  Discontinued       Note to Pharmacy: Patient taking differently: X 10 day starting 07/09 - 07/19     750 mg Oral Daily 07/02/21 0032 07/02/21 0154   07/01/21 2145  ceFEPIme (MAXIPIME) 2 g in sodium chloride 0.9 % 100 mL IVPB        2 g 200 mL/hr over 30 Minutes Intravenous  Once 07/01/21 2134 07/01/21 2325   07/01/21 2130  metroNIDAZOLE (FLAGYL) IVPB 500 mg  Status:  Discontinued        500 mg 100 mL/hr over 60 Minutes Intravenous  Once 07/01/21 2125 07/02/21 0003         I have personally reviewed the following labs and images: CBC: Recent Labs  Lab 07/01/21 1508 07/02/21 0615  WBC 10.0 9.4  NEUTROABS 7.7 6.6  HGB 9.0* 8.2*  HCT 29.1* 26.1*  MCV 101.4* 100.4*  PLT 212 183   BMP &GFR Recent Labs  Lab 07/01/21 1508 07/02/21 0615  NA 138 139  K 3.6 3.4*  CL 104 104  CO2 24 23  GLUCOSE 75 57*  BUN 13 14  CREATININE 3.49* 2.97*  CALCIUM 7.6* 7.7*  MG  --  1.0*   CrCl cannot be calculated (Unknown ideal weight.). Liver & Pancreas: Recent Labs  Lab 07/01/21 1508 07/02/21 0615  AST 27 21  ALT 17 15  ALKPHOS 58 50  BILITOT 0.8 0.9  PROT 5.4* 4.8*  ALBUMIN 2.7* 2.5*   No results for input(s): LIPASE, AMYLASE in the last 168 hours. No results for input(s): AMMONIA in the last 168 hours. Diabetic: No results for input(s): HGBA1C in the last 72 hours. No results for input(s): GLUCAP in the last 168 hours. Cardiac Enzymes: No results for input(s): CKTOTAL, CKMB, CKMBINDEX, TROPONINI in the last 168 hours. No results for input(s): PROBNP in the last 8760 hours. Coagulation Profile: Recent Labs  Lab 07/01/21 1508  INR 1.3*   Thyroid Function Tests: Recent Labs    07/02/21 0615  TSH 5.312*   Lipid Profile: No results for input(s): CHOL, HDL, LDLCALC, TRIG, CHOLHDL, LDLDIRECT in the last 72 hours. Anemia Panel: Recent Labs    07/02/21 0615  VITAMINB12 2,603*  FOLATE 13.5   Urine analysis:    Component Value Date/Time   COLORURINE YELLOW 05/26/2021 2115   APPEARANCEUR CLEAR 05/26/2021 2115   LABSPEC 1.017 05/26/2021 2115   PHURINE 5.0 05/26/2021 2115   GLUCOSEU 50 (A) 05/26/2021 2115   GLUCOSEU NEGATIVE 07/13/2020 0900   HGBUR SMALL (A) 05/26/2021 2115   BILIRUBINUR NEGATIVE 05/26/2021 2115   KETONESUR NEGATIVE 05/26/2021 2115   PROTEINUR NEGATIVE 05/26/2021 2115   UROBILINOGEN 0.2 07/13/2020 0900   NITRITE NEGATIVE 05/26/2021 2115   LEUKOCYTESUR TRACE (A) 05/26/2021 2115   Sepsis Labs: Invalid  input(s): PROCALCITONIN, LACTICIDVEN  Microbiology: Recent Results (from the past 240 hour(s))  Resp Panel by RT-PCR (Flu A&B, Covid) Nasopharyngeal Swab     Status: None   Collection Time: 06/23/21 12:43 PM   Specimen: Nasopharyngeal Swab; Nasopharyngeal(NP) swabs in vial transport medium  Result Value Ref Range Status   SARS Coronavirus 2 by RT PCR NEGATIVE NEGATIVE Final    Comment: (NOTE) SARS-CoV-2 target nucleic  acids are NOT DETECTED.  The SARS-CoV-2 RNA is generally detectable in upper respiratory specimens during the acute phase of infection. The lowest concentration of SARS-CoV-2 viral copies this assay can detect is 138 copies/mL. A negative result does not preclude SARS-Cov-2 infection and should not be used as the sole basis for treatment or other patient management decisions. A negative result may occur with  improper specimen collection/handling, submission of specimen other than nasopharyngeal swab, presence of viral mutation(s) within the areas targeted by this assay, and inadequate number of viral copies(<138 copies/mL). A negative result must be combined with clinical observations, patient history, and epidemiological information. The expected result is Negative.  Fact Sheet for Patients:  BloggerCourse.com  Fact Sheet for Healthcare Providers:  SeriousBroker.it  This test is no t yet approved or cleared by the [place] FDA and  has been authorized for detection and/or diagnosis of SARS-CoV-2 by FDA under an Emergency Use Authorization (EUA). This EUA will remain  in effect (meaning this test can be used) for the duration of the COVID-19 declaration under Section 564(b)(1) of the Act, 21 U.S.C.section 360bbb-3(b)(1), unless the authorization is terminated  or revoked sooner.       Influenza A by PCR NEGATIVE NEGATIVE Final   Influenza B by PCR NEGATIVE NEGATIVE Final    Comment: (NOTE) The Xpert  Xpress SARS-CoV-2/FLU/RSV plus assay is intended as an aid in the diagnosis of influenza from Nasopharyngeal swab specimens and should not be used as a sole basis for treatment. Nasal washings and aspirates are unacceptable for Xpert Xpress SARS-CoV-2/FLU/RSV testing.  Fact Sheet for Patients: BloggerCourse.com  Fact Sheet for Healthcare Providers: SeriousBroker.it  This test is not yet approved or cleared by the [place] FDA and has been authorized for detection and/or diagnosis of SARS-CoV-2 by FDA under an Emergency Use Authorization (EUA). This EUA will remain in effect (meaning this test can be used) for the duration of the COVID-19 declaration under Section 564(b)(1) of the Act, 21 U.S.C. section 360bbb-3(b)(1), unless the authorization is terminated or revoked.  Performed at [facility], [address] [place], [place] [address]   SARS CORONAVIRUS 2 (TAT 6-24 HRS) Nasopharyngeal Nasopharyngeal Swab     Status: None   Collection Time: 07/01/21 10:50 PM   Specimen: Nasopharyngeal Swab  Result Value Ref Range Status   SARS Coronavirus 2 NEGATIVE NEGATIVE Final    Comment: (NOTE) SARS-CoV-2 target nucleic acids are NOT DETECTED.  The SARS-CoV-2 RNA is generally detectable in upper and lower respiratory specimens during the acute phase of infection. Negative results do not preclude SARS-CoV-2 infection, do not rule out co-infections with other pathogens, and should not be used as the sole basis for treatment or other patient management decisions. Negative results must be combined with clinical observations, patient history, and epidemiological information. The expected result is Negative.  Fact Sheet for Patients: HairSlick.no  Fact Sheet for Healthcare Providers: quierodirigir.com  This test is not yet approved or cleared by the [place] FDA and   has been authorized for detection and/or diagnosis of SARS-CoV-2 by FDA under an Emergency Use Authorization (EUA). This EUA will remain  in effect (meaning this test can be used) for the duration of the COVID-19 declaration under Se ction 564(b)(1) of the Act, 21 U.S.C. section 360bbb-3(b)(1), unless the authorization is terminated or revoked sooner.  Performed at [facility], [address], [place], [place] [address]   MRSA Next Gen by PCR, Nasal  Status: None   Collection Time: 07/02/21 11:03 AM   Specimen: Nasal Mucosa; Nasal Swab  Result Value Ref Range Status   MRSA by PCR Next Gen NOT DETECTED NOT DETECTED Final    Comment: (NOTE) The GeneXpert MRSA Assay (FDA approved for NASAL specimens only), is one component of a comprehensive MRSA colonization surveillance program. It is not intended to diagnose MRSA infection nor to guide or monitor treatment for MRSA infections. Test performance is not FDA approved in patients less than 65 years old. Performed at [facility], [address], [place], [place] [address]     Radiology Studies: CT Abdomen Pelvis Wo Contrast  Result Date: 07/01/2021 CLINICAL DATA:  Unspecified abdominal pain, abdominal distension EXAM: CT ABDOMEN AND PELVIS WITHOUT CONTRAST TECHNIQUE: Multidetector CT imaging of the abdomen and pelvis was performed following the standard protocol without IV contrast. COMPARISON:  CTA 06/13/2021 FINDINGS: Lower chest: Stable elevation of the left hemidiaphragm. Cardiac size within normal limits. No pericardial effusion. Hepatobiliary: Tiny probable cyst within the right hepatic dome again noted. Liver otherwise unremarkable. Gallbladder unremarkable. No intra or extrahepatic biliary ductal dilation. Pancreas: Unremarkable Spleen: Unremarkable Adrenals/Urinary Tract: Adrenal glands are unremarkable. Kidneys are normal, without renal calculi, focal lesion, or hydronephrosis. Bladder is unremarkable.  Stomach/Bowel: Stomach is within normal limits. Appendix appears normal. No evidence of bowel wall thickening, distention, or inflammatory changes. No free intraperitoneal gas or fluid. Midpelvic percutaneous drainage catheter is unchanged in position with its loop seen just superior to the bladder dome. No associated pericatheter fluid collection identified. Vascular/Lymphatic: Inferior vena cava filter is unchanged in position within the infrarenal cava. Mild aortoiliac atherosclerotic calcification. No aortic aneurysm. No pathologic adenopathy within the abdomen and pelvis. Reproductive: Uterus and bilateral adnexa are unremarkable. Other: Tiny fat containing broad-based umbilical hernia. Rectum unremarkable. Musculoskeletal: No acute bone abnormality. Degenerative changes are seen within the lumbar spine. No lytic or blastic bone lesion. IMPRESSION: No acute intra-abdominal pathology identified. No definite radiographic explanation for the patient's reported symptoms. Redemonstration of a percutaneous drainage catheter within the mid pelvis anteriorly with complete evacuation of the previously noted pelvic abscess seen on CT examination of 05/26/2021. Mild sigmoid diverticulosis. No significant pericolonic inflammatory change identified. Aortic Atherosclerosis (ICD10-I70.0). Electronically Signed   By: Helyn Numbers MD   On: 07/01/2021 22:19   CT HEAD WO CONTRAST  Result Date: 07/02/2021 CLINICAL DATA:  Acute metabolic encephalopathy.  Lethargy. EXAM: CT HEAD WITHOUT CONTRAST TECHNIQUE: Contiguous axial images were obtained from the base of the skull through the vertex without intravenous contrast. 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 Stable tiny peripheral infarct within the left cerebellar hemisphere. Normal anatomic configuration of the brain. Mild parenchymal volume loss is commensurate with the patient's age. Mild  periventricular white matter changes are present likely reflecting the sequela of small vessel ischemia. No evidence of acute intracranial hemorrhage or infarct. No abnormal mass effect or midline shift. No abnormal intra or extra-axial mass lesion or fluid collection. Ventricular size is normal. Vascular: No hyperdense vessel or unexpected calcification. Skull: Intact Sinuses/Orbits: Interval bilateral ethmoidectomy. There is, additionally, development of remote bilateral medial orbital wall fractures. 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 Resultant increasing proptosis of the ocular globes bilaterally., Other: The mastoid air cells and middle ear cavities are clear. IMPRESSION: No acute intracranial abnormality. 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 Interval remote medial orbital wall fractures bilaterally and ethmoidectomy. Electronically Signed   By: Helyn Numbers MD   On: 07/02/2021 03:00   US RENAL  Result Date: 07/02/2021 CLINICAL DATA:  Initial evaluation for renal failure. EXAM: RENAL / URINARY TRACT ULTRASOUND COMPLETE COMPARISON:  CT from 07/01/2021. FINDINGS: Right Kidney: Renal measurements: 10.6 x 4.5 x 4.7 cm = volume: 115.9 mL. Renal echogenicity within normal limits. No nephrolithiasis or hydronephrosis. No focal renal mass. Left Kidney: Renal measurements: 10.0 x 5.8 x 4.9 cm = volume: 148.1 mL. Renal echogenicity within normal limits. No nephrolithiasis or hydronephrosis. No focal renal mass. Bladder: Appears normal for degree of bladder distention. Other: None. IMPRESSION: Normal renal ultrasound. No hydronephrosis or other significant finding. Electronically Signed   By: Rise Mu M.D.   On: 07/02/2021 01:33   DG Chest Port 1  View  Result Date: 07/01/2021 CLINICAL DATA:  Confusion, sepsis, tachycardia EXAM: PORTABLE CHEST 1 VIEW COMPARISON:  05/26/2021 FINDINGS: Single frontal view of the chest demonstrates a stable cardiac silhouette. Chronic prominence of the central pulmonary vasculature. No airspace disease, effusion, or pneumothorax. No acute bony abnormalities. IMPRESSION: 1. Stable chest, no acute process. Electronically Signed   By: Sharlet Salina M.D.   On: 07/01/2021 16:09      Gardenia Witter T. Fabiana Dromgoole [facility]  If 7PM-7AM, please contact night-coverage [email or web address] 07/02/2021, 2:42 PM

## 2021-07-02 NOTE — H&P (Signed)
History and Physical    [name] ZOX:[id number] DOB: [birth year] DOA: 07/01/2021  PCP: Ollen Bowl, MD  Patient coming from: SNF   Chief Complaint:  Chief Complaint  Patient presents with   Seizures   Nausea     HPI:    70 year old female with past medical history of chronic kidney disease stage IIIa, hypertension, diabetes mellitus type 2 hypothyroidism, hyperlipidemia, diastolic congestive heart failure (Echo 05/2021 EF 65-70%) and recent prolonged hospitalization for perforated diverticulitis with abscess formation who presents to [facility] emergency department due to progressively worsening lethargy and weakness with a witnessed episode of shaking activity this afternoon.  Patient is a extremely poor historian due to encephalopathy.  History has been obtained both from the patient, via review of emergency department notes and discussion with the daughter who is at the bedside.  Of note, patient was hospitalized at [facility] from 6/10 until 7/8 patient was originally hospitalized for diverticulitis complicated by perforation and development of a pelvic abscess.  Patient was treated with intravenous antibiotics and a IR drain placed on 6/21. Hospital course was complicated by i development of bilateral pulmonary emboli for which the patient was initially placed on anticoagulation.  Hospital course was complicated by recurrent bouts of acute gastrointestinal bleeding.  Anticoagulation was intermittently held and patient was evaluated via EGD twice on 6/19 and 6/29 that did not reveal a source of the bleeding.  Due to patient's repeated bouts of acute gastrointestinal bleeding with any attempted anticoagulation and IVC filter was placed and anticoagulation was eventually discontinued.  Patient was discharged to skilled nursing facility on 7/8 with plan for patient to continue to take 10 more days of oral levofloxacin.    While the patient was while the patient has been at the  skilled nursing facility over the past week patient has exhibited extremely poor oral intake.  She states that she has no appetite, experiences frequent bouts of nausea with nonbilious nonbloody vomiting.  Over the course of a week at the skilled nursing facility the patient began to develop associated generalized weakness that has progressively worsened.  According to the daughter this has been associated with worsening lethargy.  Patient and daughter also report that patient has been exhibiting frequent bouts of watery diarrhea throughout entire stay at the skilled nursing facility.  Patient has been experiencing at least 6 episodes of nonbloody watery diarrhea daily.  Patient denies any associated abdominal pain or fever.  The afternoon of 7/16 patient has exhibited severe confusion.  This is been associated with an episode of "twitching."  EMS was contacted who promptly came to evaluate the patient.  Patient is unable to contribute any additional history as she does not recall any of these events.  Due to patient's progressively worsening weakness confusion and episode of twitching with concern of possible seizure-like activity patient was brought into [facility] emergency department for evaluation.  Upon evaluation in the emergency department patient was found to exhibit substantial lactic acidosis, initially 2.5 and increasing to 2.9 despite intravenous fluids.  Patient was also found to be suffering from acute kidney injury with creatinine of 3.49, up from 1.1 approximately 1 week prior.  Patient was given a dose of empiric intravenous cefepime and provided with intravenous fluids.  CT imaging of the abdomen and pelvis was performed revealing no evidence of acute intra-abdominal pathology with complete evacuation of the previously noted pelvic abscess.  Due to ongoing lethargy and confusion, concern for possible seizure activity and  acute kidney injury with lactic acidosis hospitalist group  was called to assess the patient for admission to the hospital.   Review of Systems:   Review of Systems  Unable to perform ROS: Mental status change   Past Medical History:  Diagnosis Date   Anemia    Celiac disease    CHF (congestive heart failure) (HCC)    Depression    Diabetes mellitus without complication (HCC)    Edema    lower extremities   Emphysema of lung (HCC)    Enlarged heart    begining stage   Graves disease    Headache(784.0) 05/18/2013   Heart murmur    Hematochezia    Hemorrhoid    Hyperlipidemia    Hypertension    Migraine    Obese    OSA (obstructive sleep apnea)    Seizures (HCC)    Sleep apnea    Stroke (HCC)    left P-O ICH 2007   Thyroid disease    hypothyroidism   Ulcer    Vitamin D deficiency     Past Surgical History:  Procedure Laterality Date   CESAREAN SECTION     ESOPHAGOGASTRODUODENOSCOPY N/A 06/14/2021   Procedure: ESOPHAGOGASTRODUODENOSCOPY (EGD);  Surgeon: Jeani HawkingHung, Patrick, MD;  [facility];  Service: Endoscopy;  Laterality: N/A;   ESOPHAGOGASTRODUODENOSCOPY (EGD) WITH PROPOFOL N/A 06/01/2021   Procedure: ESOPHAGOGASTRODUODENOSCOPY (EGD) WITH PROPOFOL;  Surgeon: Jeani HawkingHung, Patrick, MD;  [facility];  Service: Endoscopy;  Laterality: N/A;   HEMORRHOID SURGERY     IR IVC FILTER PLMT / S&I /IMG GUID/MOD SED  06/01/2021   IR SINUS/FIST TUBE CHK-NON GI  06/14/2021   IR US GUIDE VASC ACCESS RIGHT  06/01/2021   IUD REMOVAL     TONSILLECTOMY     VEIN SURGERY       reports that she has never smoked. She has never used smokeless tobacco. She reports that she does not drink alcohol and does not use drugs.  Allergies  Allergen Reactions   Gluten Meal     Celiac    Family History  Problem Relation Age of Onset   Heart disease Father    Diabetes Father    Heart failure Father    Diabetes Mother    Thyroid disease Sister    Diabetes Brother      Prior to Admission medications   Medication Sig Start Date End Date  Taking? Authorizing Provider  acetaminophen (TYLENOL) 325 MG tablet Take 650 mg by mouth every 4 (four) hours as needed for fever.   Yes [provider]  albuterol (VENTOLIN HFA) 108 (90 Base) MCG/ACT inhaler Inhale 2 puffs into the lungs every 6 (six) hours as needed for wheezing or shortness of breath.   Yes [provider]  Fluticasone-Salmeterol (ADVAIR) 250-50 MCG/DOSE AEPB Inhale 1 puff into the lungs daily as needed for allergies.   Yes [provider]  furosemide (LASIX) 20 MG tablet Take 1 tablet (20 mg total) by mouth daily. 05/22/21  Yes White, Adrienne R, NP  levofloxacin (LEVAQUIN) 750 MG tablet Take 1 tablet (750 mg total) by mouth daily. Patient taking differently: Take 750 mg by mouth daily. X 10 day starting 07/09 - 07/19 06/24/21  Yes Narda BondsNettey, Ralph A, MD  levothyroxine (SYNTHROID) 112 MCG tablet Take 1 tablet by mouth Monday through Saturday and 1/2 tablet on Sunday. Patient taking differently: Take 112 mcg by mouth See admin instructions. Take 1 tablet by mouth Monday through Saturday and 1/2 tablet on Sunday.  11/08/20  Yes Reather Littler, MD  meclizine (ANTIVERT) 25 MG tablet Take 1 tablet by mouth daily as needed for dizziness.   Yes [provider]  metFORMIN (GLUCOPHAGE) 500 MG tablet Take 500 mg by mouth 2 (two) times daily. 08/23/20  Yes [provider]  mirtazapine (REMERON) 7.5 MG tablet Take 1 tablet (7.5 mg total) by mouth at bedtime. 06/23/21  Yes Narda Bonds, MD  ondansetron (ZOFRAN) 4 MG tablet Take 4 mg by mouth every 8 (eight) hours as needed for nausea or vomiting.   Yes [provider]  OVER THE COUNTER MEDICATION Take 1 tablet by mouth daily. Medication: Vitamin b12 3000 mcg   Yes [provider]  pantoprazole (PROTONIX) 40 MG tablet Take 40 mg by mouth 2 (two) times daily.   Yes [provider]  rosuvastatin (CRESTOR) 5 MG tablet Take 1 tablet (5 mg total) by mouth daily. 11/08/20  Yes Nahser, Deloris Ping, MD  saccharomyces boulardii (FLORASTOR) 250 MG capsule Take 1 capsule (250 mg total) by mouth 2 (two) times daily. 06/23/21  Yes Narda Bonds, MD  feeding supplement (ENSURE ENLIVE / ENSURE PLUS) LIQD Take 237 mLs by mouth 2 (two) times daily between meals. Patient not taking: Reported on 07/01/2021 06/23/21   Narda Bonds, MD  glucose blood (ONETOUCH VERIO) test strip Use as intstructed to check blood sugar once a day Dx Code E11.9 Patient not taking: Reported on 07/01/2021 02/18/21   Reather Littler, MD  Lancets [facility] ULTRASOFT) lancets Use as instructed Patient not taking: Reported on 07/01/2021 10/13/20   Reather Littler, MD  pantoprazole (PROTONIX) 40 MG tablet Take 1 tablet (40 mg total) by mouth 2 (two) times daily before a meal. Patient not taking: Reported on 07/01/2021 06/23/21   Narda Bonds, MD    Physical Exam: Vitals:   07/01/21 2130 07/01/21 2234 07/01/21 2300 07/02/21 0000  BP: 94/65 119/72 121/71 114/74  Pulse: 84 87 89 84  Resp: 18 18 18 18   Temp:      TempSrc:      SpO2: 100% 97% 99% 99%    Constitutional: Lethargic but arousable, oriented x3.  Patient is not in any acute distress. Skin: no rashes, no lesions, poor skin turgor noted. Eyes: Pupils are equally reactive to light.  No evidence of scleral icterus or conjunctival pallor.  ENMT: Extremely dry mucous membranes noted.  Posterior pharynx clear of any exudate or lesions.   Neck: normal, supple, no masses, no thyromegaly.  No evidence of jugular venous distension.   Respiratory: clear to auscultation bilaterally, no wheezing, no crackles. Normal respiratory effort. No accessory muscle use.  Cardiovascular: Regular rate and rhythm, no murmurs / rubs / gallops. No extremity edema. 2+ pedal pulses. No carotid bruits.  Chest:   Nontender without crepitus or deformity.   Back:   Nontender without crepitus or deformity. Abdomen: Abdomen is soft and nontender.  Percutaneous drain in place in the lower abdomen draining  scant amounts of serosanguineous drainage.  No evidence of intra-abdominal masses.  Positive bowel sounds noted in all quadrants.   Musculoskeletal: No joint deformity upper and lower extremities. Good ROM, no contractures. Normal muscle tone.  Neurologic: CN 2-12 grossly intact. Sensation intact.  Patient moving all 4 extremities spontaneously.  Patient is following all commands.  Patient is responsive to verbal stimuli.   Psychiatric: Patient exhibits normal mood with affect.  Patient currently does not seem to possess insight as to her current situation.  Labs on Admission: I have personally reviewed following labs and imaging studies -   CBC: Recent Labs  Lab 07/01/21 1508  WBC 10.0  NEUTROABS 7.7  HGB 9.0*  HCT 29.1*  MCV 101.4*  PLT 212   Basic Metabolic Panel: Recent Labs  Lab 07/01/21 1508  NA 138  K 3.6  CL 104  CO2 24  GLUCOSE 75  BUN 13  CREATININE 3.49*  CALCIUM 7.6*   GFR: CrCl cannot be calculated (Unknown ideal weight.). Liver Function Tests: Recent Labs  Lab 07/01/21 1508  AST 27  ALT 17  ALKPHOS 58  BILITOT 0.8  PROT 5.4*  ALBUMIN 2.7*   No results for input(s): LIPASE, AMYLASE in the last 168 hours. No results for input(s): AMMONIA in the last 168 hours. Coagulation Profile: Recent Labs  Lab 07/01/21 1508  INR 1.3*   Cardiac Enzymes: No results for input(s): CKTOTAL, CKMB, CKMBINDEX, TROPONINI in the last 168 hours. BNP (last 3 results) No results for input(s): PROBNP in the last 8760 hours. HbA1C: No results for input(s): HGBA1C in the last 72 hours. CBG: No results for input(s): GLUCAP in the last 168 hours. Lipid Profile: No results for input(s): CHOL, HDL, LDLCALC, TRIG, CHOLHDL, LDLDIRECT in the last 72 hours. Thyroid Function Tests: No results for input(s): TSH, T4TOTAL, FREET4, T3FREE, THYROIDAB in the last 72 hours. Anemia Panel: No results for input(s): VITAMINB12, FOLATE, FERRITIN, TIBC, IRON, RETICCTPCT in the last 72  hours. Urine analysis:    Component Value Date/Time   COLORURINE YELLOW 05/26/2021 2115   APPEARANCEUR CLEAR 05/26/2021 2115   LABSPEC 1.017 05/26/2021 2115   PHURINE 5.0 05/26/2021 2115   GLUCOSEU 50 (A) 05/26/2021 2115   GLUCOSEU NEGATIVE 07/13/2020 0900   HGBUR SMALL (A) 05/26/2021 2115   BILIRUBINUR NEGATIVE 05/26/2021 2115   KETONESUR NEGATIVE 05/26/2021 2115   PROTEINUR NEGATIVE 05/26/2021 2115   UROBILINOGEN 0.2 07/13/2020 0900   NITRITE NEGATIVE 05/26/2021 2115   LEUKOCYTESUR TRACE (A) 05/26/2021 2115    Radiological Exams on Admission - Personally Reviewed: CT Abdomen Pelvis Wo Contrast  Result Date: 07/01/2021 CLINICAL DATA:  Unspecified abdominal pain, abdominal distension EXAM: CT ABDOMEN AND PELVIS WITHOUT CONTRAST TECHNIQUE: Multidetector CT imaging of the abdomen and pelvis was performed following the standard protocol without IV contrast. COMPARISON:  CTA 06/13/2021 FINDINGS: Lower chest: Stable elevation of the left hemidiaphragm. Cardiac size within normal limits. No pericardial effusion. Hepatobiliary: Tiny probable cyst within the right hepatic dome again noted. Liver otherwise unremarkable. Gallbladder unremarkable. No intra or extrahepatic biliary ductal dilation. Pancreas: Unremarkable Spleen: Unremarkable Adrenals/Urinary Tract: Adrenal glands are unremarkable. Kidneys are normal, without renal calculi, focal lesion, or hydronephrosis. Bladder is unremarkable. Stomach/Bowel: Stomach is within normal limits. Appendix appears normal. No evidence of bowel wall thickening, distention, or inflammatory changes. No free intraperitoneal gas or fluid. Midpelvic percutaneous drainage catheter is unchanged in position with its loop seen just superior to the bladder dome. No associated pericatheter fluid collection identified. Vascular/Lymphatic: Inferior vena cava filter is unchanged in position within the infrarenal cava. Mild aortoiliac atherosclerotic calcification. No aortic  aneurysm. No pathologic adenopathy within the abdomen and pelvis. Reproductive: Uterus and bilateral adnexa are unremarkable. Other: Tiny fat containing broad-based umbilical hernia. Rectum unremarkable. Musculoskeletal: No acute bone abnormality. Degenerative changes are seen within the lumbar spine. No lytic or blastic bone lesion. IMPRESSION: No acute intra-abdominal pathology identified. No definite radiographic explanation for the patient's reported symptoms. Redemonstration of a percutaneous drainage catheter within the mid pelvis anteriorly with complete  evacuation of the previously noted pelvic abscess seen on CT examination of 05/26/2021. Mild sigmoid diverticulosis. No significant pericolonic inflammatory change identified. Aortic Atherosclerosis (ICD10-I70.0). Electronically Signed   By: Helyn Numbers MD   On: 07/01/2021 22:19   DG Chest Port 1 View  Result Date: 07/01/2021 CLINICAL DATA:  Confusion, sepsis, tachycardia EXAM: PORTABLE CHEST 1 VIEW COMPARISON:  05/26/2021 FINDINGS: Single frontal view of the chest demonstrates a stable cardiac silhouette. Chronic prominence of the central pulmonary vasculature. No airspace disease, effusion, or pneumothorax. No acute bony abnormalities. IMPRESSION: 1. Stable chest, no acute process. Electronically Signed   By: Sharlet Salina M.D.   On: 07/01/2021 16:09    EKG: Personally reviewed.  Rhythm is normal sinus rhythm with heart rate of 93 bpm.  Notable prolonged QTc of 499.  No dynamic ST segment changes appreciated.  Assessment/Plan Principal Problem:   Acute renal failure superimposed on stage 3a chronic kidney disease (HCC)  Notable acute kidney injury with creatinine of 3.49, up from 1.1 at discharge 1 week prior This is in the setting of extremely poor oral intake over the past week making this likely prerenal in etiology Obtaining urine electrolytes, renal ultrasound, urinalysis Hydrating patient with intravenous isotonic fluids Strict  input and output monitoring Monitoring renal function and electrolytes with serial chemistries Avoiding nephrotoxic agents if at all possible  Active Problems:  Acute diarrhea  Patient exhibiting frequent bouts of watery diarrhea in the setting of nearly 5 weeks of antibiotic therapy CT imaging of the abdomen pelvis reveals no obvious evidence of colitis That being said, there is significant concern for the possibility of C. difficile in the setting of prolonged antibiotic use Obtaining stool studies to test for C. Difficile Enteric precautions in the meantime Holding any further antibiotics for now  Possible seizure activity Possible episode of "shaking" activity the afternoon of 7/16 Patient has no recollection of this and daughter is unable to contribute any history Daughter reports the patient has a remote history of a small intracranial hemorrhage at least 10 years ago but has no history of epilepsy Will monitor for recurrence Will obtain EEG in the morning and obtain neurology consultation if abnormal    Lactic acidosis  Patient exhibiting lactic acidosis with lactic acid levels of 2.5 and 2.9 upon arrival to the emergency department Hydrating patient with intravenous isotonic fluids Performing serial lactic acid levels to ensure downtrending and resolution    Acute metabolic encephalopathy  Patient is exhibiting notable lethargy and confusion Patient is likely suffering from encephalopathy secondary to volume depletion, renal injury and possible underlying infection Obtaining CT head, TSH (which was 22 several weeks ago ), vitamin B12, folate Will possibly spontaneously resolve with intravenous volume resuscitation and addressing the possibility of infectious diarrhea   Bilateral pulmonary embolism (HCC)  Complicated history of bilateral pulmonary emboli with right heart strain during recent hospitalization Patient had a complicated course of frequent bouts of  gastrointestinal bleeding with any attempts at anticoagulation Patient ultimately had to have anticoagulation discontinued Patient is status post IVC filter placement during that hospitalization    Chronic diastolic CHF (congestive heart failure) (HCC)  No evidence of cardiogenic volume overload at this time    Type 2 diabetes mellitus with stage 3a chronic kidney disease, without long-term current use of insulin (HCC)  Accu-Cheks before every meal and nightly with sliding scale insulin Holding outpatient regimen of metformin Hemoglobin A1c on 5/7 9.4%.    Moderate persistent asthma, uncomplicated  No evidence of asthma  exacerbation at this time As needed bronchodilator therapy for shortness of breath and wheezing    Hypothyroidism Continuing Synthroid at 112 mcg daily Last TSH performed several weeks ago was found to be 22, will repeat to ensure that this is trending towards target  Recent gastrointestinal bleeding  Continue PPI twice daily  Moderate protein calorie malnutrition  Ongoing extremely poor oral intake over the past several weeks Because of poor appetite unclear at this point, possibly due to untreated C. difficile? Nutrition consultation order placed, input is appreciated.    Code Status:  Full code Family Communication: Daughter is at bedside who has been updated on plan of care  Status is: Inpatient  Remains inpatient appropriate because:Ongoing diagnostic testing needed not appropriate for outpatient work up, IV treatments appropriate due to intensity of illness or inability to take PO, and Inpatient level of care appropriate due to severity of illness  Dispo: The patient is from: SNF              Anticipated d/c is to: SNF              Patient currently is not medically stable to d/c.   Difficult to place patient No        Marinda Elk MD [facility] Pager [phone]  If 7PM-7AM, please contact night-coverage [email or web address] Use  universal [place] password for that web site. If you do not have the password, please call the hospital operator.  07/02/2021, 12:33 AM

## 2021-07-02 NOTE — Progress Notes (Addendum)
Hypoglycemic Event  CBG: 62  Treatment: 4 oz juice/soda  Symptoms: None  Follow-up CBG: Time:1809 CBG Result:71  Possible Reasons for Event: Inadequate meal intake  Comments/MD notified: [place] ice and cranberry juice given.    CBG: 71  Treatment: 4 oz juice/soda  Symptoms: None  Follow-up CBG: Time:1915 CBG Result: 68  Possible Reasons for Event: Inadequate meal intake  Comments/MD notified: Apple Juice given    CBG: 68  Treatment: D50 50 mL (25 gm)  Symptoms: None  Follow-up CBG: Time: CBG Result:  Possible Reasons for Event:   Comments/MD notified:    Virgel Manifold

## 2021-07-02 NOTE — Progress Notes (Signed)
Referring Physician(s): Gonfa,T  Supervising Physician: Oley Balm  Patient Status:  [facility] - In-pt  Chief Complaint:  Weakness/lethargy, diminished appetite, diarrhea  Subjective: Patient familiar to IR service from IVC filter placement on 06/01/2021, and pelvic diverticular abscess drain placement on 06/06/2021.  She was last seen in IR drain clinic on 06/14/2021 with pelvic abscessogram demonstrating significant residual cavity but no fistulous communication to the bowel.  Her drain was left in place given the persistent cavity.  She was admitted to [facility] yesterday with weakness lethargy diminished appetite.  CT abdomen pelvis revealed: No acute intra-abdominal pathology identified. No definite radiographic explanation for the patient's reported symptoms.   Redemonstration of a percutaneous drainage catheter within the mid pelvis anteriorly with complete evacuation of the previously noted pelvic abscess seen on CT examination of 05/26/2021.   Mild sigmoid diverticulosis. No significant pericolonic inflammatory change identified.   Aortic Atherosclerosis  She was originally scheduled to follow-up in IR drain clinic again on 07/04/2021 with CT and drain injection.  Request now received for drain evaluation and possible removal.  Patient states she feels better since her arrival to the hospital.  She appears more alert.  She denies worsening abdominal pain, nausea, vomiting at present.  She is afebrile, WBC is normal, hemoglobin 8.2, creatinine 2.97, COVID-19 negative, blood culture pending, prior drain fluid cultures grew few Pseudomonas with moderate Bacteroides.  Past Medical History:  Diagnosis Date   Anemia    Celiac disease    CHF (congestive heart failure) (HCC)    Depression    Diabetes mellitus without complication (HCC)    Edema    lower extremities   Emphysema of lung (HCC)    Enlarged heart    begining stage   Graves disease    Headache(784.0)  05/18/2013   Heart murmur    Hematochezia    Hemorrhoid    Hyperlipidemia    Hypertension    Migraine    Obese    OSA (obstructive sleep apnea)    Seizures (HCC)    Sleep apnea    Stroke (HCC)    left P-O ICH 2007   Thyroid disease    hypothyroidism   Ulcer    Vitamin D deficiency    Past Surgical History:  Procedure Laterality Date   CESAREAN SECTION     ESOPHAGOGASTRODUODENOSCOPY N/A 06/14/2021   Procedure: ESOPHAGOGASTRODUODENOSCOPY (EGD);  Surgeon: Jeani Hawking, MD;  Location: [facility] ENDOSCOPY;  Service: Endoscopy;  Laterality: N/A;   ESOPHAGOGASTRODUODENOSCOPY (EGD) WITH PROPOFOL N/A 06/01/2021   Procedure: ESOPHAGOGASTRODUODENOSCOPY (EGD) WITH PROPOFOL;  Surgeon: Jeani Hawking, MD;  Location: [facility] ENDOSCOPY;  Service: Endoscopy;  Laterality: N/A;   HEMORRHOID SURGERY     IR IVC FILTER PLMT / S&I /IMG GUID/MOD SED  06/01/2021   IR SINUS/FIST TUBE CHK-NON GI  06/14/2021   IR US GUIDE VASC ACCESS RIGHT  06/01/2021   IUD REMOVAL     TONSILLECTOMY     VEIN SURGERY       Allergies: Gluten meal  Medications: Prior to Admission medications   Medication Sig Start Date End Date Taking? Authorizing Provider  acetaminophen (TYLENOL) 325 MG tablet Take 650 mg by mouth every 4 (four) hours as needed for fever.   Yes [provider]  albuterol (VENTOLIN HFA) 108 (90 Base) MCG/ACT inhaler Inhale 2 puffs into the lungs every 6 (six) hours as needed for wheezing or shortness of breath.   Yes [provider]  Fluticasone-Salmeterol (ADVAIR) 250-50 MCG/DOSE AEPB Inhale 1  puff into the lungs daily as needed for allergies.   Yes [provider]  furosemide (LASIX) 20 MG tablet Take 1 tablet (20 mg total) by mouth daily. 05/22/21  Yes White, Adrienne R, NP  levofloxacin (LEVAQUIN) 750 MG tablet Take 1 tablet (750 mg total) by mouth daily. Patient taking differently: Take 750 mg by mouth daily. X 10 day starting 07/09 - 07/19 06/24/21  Yes Narda BondsNettey, Ralph A, MD  levothyroxine  (SYNTHROID) 112 MCG tablet Take 1 tablet by mouth Monday through Saturday and 1/2 tablet on Sunday. Patient taking differently: Take 112 mcg by mouth See admin instructions. Take 1 tablet by mouth Monday through Saturday and 1/2 tablet on Sunday. 11/08/20  Yes Reather LittlerKumar, Ajay, MD  meclizine (ANTIVERT) 25 MG tablet Take 1 tablet by mouth daily as needed for dizziness.   Yes [provider]  metFORMIN (GLUCOPHAGE) 500 MG tablet Take 500 mg by mouth 2 (two) times daily. 08/23/20  Yes [provider]  mirtazapine (REMERON) 7.5 MG tablet Take 1 tablet (7.5 mg total) by mouth at bedtime. 06/23/21  Yes Narda BondsNettey, Ralph A, MD  ondansetron (ZOFRAN) 4 MG tablet Take 4 mg by mouth every 8 (eight) hours as needed for nausea or vomiting.   Yes [provider]  OVER THE COUNTER MEDICATION Take 1 tablet by mouth daily. Medication: Vitamin b12 3000 mcg   Yes [provider]  pantoprazole (PROTONIX) 40 MG tablet Take 40 mg by mouth 2 (two) times daily.   Yes [provider]  rosuvastatin (CRESTOR) 5 MG tablet Take 1 tablet (5 mg total) by mouth daily. 11/08/20  Yes Nahser, Deloris PingPhilip J, MD  saccharomyces boulardii (FLORASTOR) 250 MG capsule Take 1 capsule (250 mg total) by mouth 2 (two) times daily. 06/23/21  Yes Narda BondsNettey, Ralph A, MD  feeding supplement (ENSURE ENLIVE / ENSURE PLUS) LIQD Take 237 mLs by mouth 2 (two) times daily between meals. Patient not taking: Reported on 07/01/2021 06/23/21   Narda BondsNettey, Ralph A, MD  glucose blood (ONETOUCH VERIO) test strip Use as intstructed to check blood sugar once a day Dx Code E11.9 Patient not taking: Reported on 07/01/2021 02/18/21   Reather LittlerKumar, Ajay, MD  Lancets [facility](ONETOUCH ULTRASOFT) lancets Use as instructed Patient not taking: Reported on 07/01/2021 10/13/20   Reather LittlerKumar, Ajay, MD  pantoprazole (PROTONIX) 40 MG tablet Take 1 tablet (40 mg total) by mouth 2 (two) times daily before a meal. Patient not taking: Reported on 07/01/2021 06/23/21   Narda BondsNettey, Ralph A, MD      Vital Signs: BP 108/65 (BP Location: Left Arm)   Pulse 88   Temp 98.1 F (36.7 C) (Oral)   Resp 20   SpO2 100%   Physical Exam awake, answering simple questions okay.  Family in room.  Midline lower abdominal drain intact, insertion site okay, no leaking, site not significantly tender to palpation, few cc of serosanguineous fluid in drain bag  Imaging: CT Abdomen Pelvis Wo Contrast  Result Date: 07/01/2021 CLINICAL DATA:  Unspecified abdominal pain, abdominal distension EXAM: CT ABDOMEN AND PELVIS WITHOUT CONTRAST TECHNIQUE: Multidetector CT imaging of the abdomen and pelvis was performed following the standard protocol without IV contrast. COMPARISON:  CTA 06/13/2021 FINDINGS: Lower chest: Stable elevation of the left hemidiaphragm. Cardiac size within normal limits. No pericardial effusion. Hepatobiliary: Tiny probable cyst within the right hepatic dome again noted. Liver otherwise unremarkable. Gallbladder unremarkable. No intra or extrahepatic biliary ductal dilation. Pancreas: Unremarkable Spleen: Unremarkable Adrenals/Urinary Tract: Adrenal glands are unremarkable. Kidneys are normal,  without renal calculi, focal lesion, or hydronephrosis. Bladder is unremarkable. Stomach/Bowel: Stomach is within normal limits. Appendix appears normal. No evidence of bowel wall thickening, distention, or inflammatory changes. No free intraperitoneal gas or fluid. Midpelvic percutaneous drainage catheter is unchanged in position with its loop seen just superior to the bladder dome. No associated pericatheter fluid collection identified. Vascular/Lymphatic: Inferior vena cava filter is unchanged in position within the infrarenal cava. Mild aortoiliac atherosclerotic calcification. No aortic aneurysm. No pathologic adenopathy within the abdomen and pelvis. Reproductive: Uterus and bilateral adnexa are unremarkable. Other: Tiny fat containing broad-based umbilical hernia. Rectum unremarkable. Musculoskeletal:  No acute bone abnormality. Degenerative changes are seen within the lumbar spine. No lytic or blastic bone lesion. IMPRESSION: No acute intra-abdominal pathology identified. No definite radiographic explanation for the patient's reported symptoms. Redemonstration of a percutaneous drainage catheter within the mid pelvis anteriorly with complete evacuation of the previously noted pelvic abscess seen on CT examination of 05/26/2021. Mild sigmoid diverticulosis. No significant pericolonic inflammatory change identified. Aortic Atherosclerosis (ICD10-I70.0). Electronically Signed   By: Helyn Numbers MD   On: 07/01/2021 22:19   CT HEAD WO CONTRAST  Result Date: 07/02/2021 CLINICAL DATA:  Acute metabolic encephalopathy.  Lethargy. EXAM: CT HEAD WITHOUT CONTRAST TECHNIQUE: Contiguous axial images were obtained from the base of the skull through the vertex without intravenous contrast. 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 Stable tiny peripheral infarct within the left cerebellar hemisphere. Normal anatomic configuration of the brain. Mild parenchymal volume loss is commensurate with the patient's age. Mild periventricular white matter changes are present likely reflecting the sequela of small vessel ischemia. No evidence of acute intracranial hemorrhage or infarct. No abnormal mass effect or midline shift. No abnormal intra or extra-axial mass lesion or fluid collection. Ventricular size is normal. Vascular: No hyperdense vessel or unexpected calcification. Skull: Intact Sinuses/Orbits: Interval bilateral ethmoidectomy. There is, additionally, development of remote bilateral medial orbital wall fractures. 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 Resultant increasing proptosis of the ocular globes bilaterally., Other: The mastoid air cells and middle  ear cavities are clear. IMPRESSION: No acute intracranial abnormality. 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 Interval remote medial orbital wall fractures bilaterally and ethmoidectomy. Electronically Signed   By: Helyn Numbers MD   On: 07/02/2021 03:00   US RENAL  Result Date: 07/02/2021 CLINICAL DATA:  Initial evaluation for renal failure. EXAM: RENAL / URINARY TRACT ULTRASOUND COMPLETE COMPARISON:  CT from 07/01/2021. FINDINGS: Right Kidney: Renal measurements: 10.6 x 4.5 x 4.7 cm = volume: 115.9 mL. Renal echogenicity within normal limits. No nephrolithiasis or hydronephrosis. No focal renal mass. Left Kidney: Renal measurements: 10.0 x 5.8 x 4.9 cm = volume: 148.1 mL. Renal echogenicity within normal limits. No nephrolithiasis or hydronephrosis. No focal renal mass. Bladder: Appears normal for degree of bladder distention. Other: None. IMPRESSION: Normal renal ultrasound. No hydronephrosis or other significant finding. Electronically Signed   By: Rise Mu M.D.   On: 07/02/2021 01:33   DG Chest Port 1 View  Result Date: 07/01/2021 CLINICAL DATA:  Confusion, sepsis, tachycardia EXAM: PORTABLE CHEST 1 VIEW COMPARISON:  05/26/2021 FINDINGS: Single frontal view of the chest demonstrates a stable cardiac silhouette. Chronic prominence of the central pulmonary vasculature. No airspace disease, effusion, or pneumothorax. No acute bony abnormalities. IMPRESSION:  1. Stable chest, no acute process. Electronically Signed   By: Sharlet Salina M.D.   On: 07/01/2021 16:09    Labs:  CBC: Recent Labs    06/21/21 0603 06/23/21 0445 07/01/21 1508 07/02/21 0615  WBC 10.0 9.7 10.0 9.4  HGB 8.5* 8.3* 9.0* 8.2*  HCT 27.1* 25.6* 29.1* 26.1*  PLT 201 194 212 183    COAGS: Recent Labs    06/06/21 0230 07/01/21 1508  INR 1.1 1.3*  APTT   --  28    BMP: Recent Labs    11/07/20 1058 03/26/21 1927 06/21/21 0445 06/23/21 0445 07/01/21 1508 07/02/21 0615  NA 141   < > 138 138 138 139  K 4.1   < > 4.1 3.9 3.6 3.4*  CL 103   < > 108 108 104 104  CO2 24   < > 22 25 24 23   GLUCOSE 118*   < > 85 83 75 57*  BUN 13   < > 5* 8 13 14   CALCIUM 9.9   < > 8.6* 8.4* 7.6* 7.7*  CREATININE 1.06*   < > 1.01* 1.10* 3.49* 2.97*  GFRNONAA 53*   < > 60* 54* 14* 16*  GFRAA 61  --   --   --   --   --    < > = values in this interval not displayed.    LIVER FUNCTION TESTS: Recent Labs    06/15/21 0500 06/16/21 0253 07/01/21 1508 07/02/21 0615  BILITOT 0.5 0.7 0.8 0.9  AST 16 18 27 21   ALT 17 18 17 15   ALKPHOS 44 46 58 50  PROT 3.8* 4.0* 5.4* 4.8*  ALBUMIN 1.9* 2.0* 2.7* 2.5*    Assessment and Plan: Patient familiar to IR service from IVC filter placement on 06/01/2021, and pelvic diverticular abscess drain placement on 06/06/2021.  She was last seen in IR drain clinic on 06/14/2021 with pelvic abscessogram demonstrating significant residual cavity but no fistulous communication to the bowel.  Her drain was left in place given the persistent cavity.  She was admitted to [facility] yesterday with weakness lethargy diminished appetite.  CT abdomen pelvis revealed: No acute intra-abdominal pathology identified. No definite radiographic explanation for the patient's reported symptoms.   Redemonstration of a percutaneous drainage catheter within the mid pelvis anteriorly with complete evacuation of the previously noted pelvic abscess seen on CT examination of 05/26/2021.   Mild sigmoid diverticulosis. No significant pericolonic inflammatory change identified.   Aortic Atherosclerosis  She was originally scheduled to follow-up in IR drain clinic again on 07/04/2021 with CT and drain injection.  Request now received for drain evaluation and possible removal.  Patient states she feels better since her arrival to the hospital.   She appears more alert.  She denies worsening abdominal pain, nausea, vomiting at present.  She is afebrile, WBC is normal, hemoglobin 8.2, creatinine 2.97, COVID-19 negative, blood culture pending, prior drain fluid cultures grew few Pseudomonas with moderate Bacteroides.  Case discussed with Dr. 06/16/2021.  We will plan on drain injection and possible removal on 7/18.  Family updated with plan.   Electronically Signed: D. 07/26/2021, PA-C 07/02/2021, 10:34 AM   I spent a total of 15 minutes at the the patient's bedside AND on the patient's hospital floor or unit, greater than 50% of which was counseling/coordinating care for lower abdominal/pelvic abscess drain    Patient ID: [name], female   DOB: Jul 13, 1951, 70 y.o.   MRN: 07/04/2021

## 2021-07-02 NOTE — Progress Notes (Signed)
RT set up CPAP for patient and placed patient on CPAP with auto-titrate max 18, min 4. Patient stated she wanted to take it off to get her air. Patient instructed to call if and when she would like to try it again.

## 2021-07-03 ENCOUNTER — Inpatient Hospital Stay (HOSPITAL_COMMUNITY): Payer: Medicare HMO

## 2021-07-03 ENCOUNTER — Inpatient Hospital Stay (HOSPITAL_COMMUNITY)
Admit: 2021-07-03 | Discharge: 2021-07-03 | Disposition: A | Discharge status: Home / Self Care | Payer: Medicare HMO | Attending: Neurology | Admitting: Neurology

## 2021-07-03 DIAGNOSIS — I5032 Chronic diastolic (congestive) heart failure: Secondary | ICD-10-CM | POA: Diagnosis not present

## 2021-07-03 DIAGNOSIS — I2699 Other pulmonary embolism without acute cor pulmonale: Secondary | ICD-10-CM | POA: Diagnosis not present

## 2021-07-03 DIAGNOSIS — R569 Unspecified convulsions: Secondary | ICD-10-CM

## 2021-07-03 DIAGNOSIS — N179 Acute kidney failure, unspecified: Secondary | ICD-10-CM | POA: Diagnosis not present

## 2021-07-03 DIAGNOSIS — R197 Diarrhea, unspecified: Secondary | ICD-10-CM | POA: Diagnosis not present

## 2021-07-03 HISTORY — PX: IR SINUS/FIST TUBE CHK-NON GI: IMG673

## 2021-07-03 LAB — GLUCOSE, CAPILLARY
Glucose-Capillary: 71 mg/dL (ref 70–99)
Glucose-Capillary: 75 mg/dL (ref 70–99)
Glucose-Capillary: 86 mg/dL (ref 70–99)
Glucose-Capillary: 122 mg/dL — ABNORMAL HIGH (ref 70–99)

## 2021-07-03 LAB — RETICULOCYTES
Immature Retic Fract: 9.9 % (ref 2.3–15.9)
RBC.: 2.51 MIL/uL — ABNORMAL LOW (ref 3.87–5.11)
Retic Count, Absolute: 32.9 10*3/uL (ref 19.0–186.0)
Retic Ct Pct: 1.3 % (ref 0.4–3.1)

## 2021-07-03 LAB — RENAL FUNCTION PANEL
Albumin: 2.2 g/dL — ABNORMAL LOW (ref 3.5–5.0)
Anion gap: 9 (ref 5–15)
BUN: 12 mg/dL (ref 8–23)
CO2: 22 mmol/L (ref 22–32)
Calcium: 7.6 mg/dL — ABNORMAL LOW (ref 8.9–10.3)
Chloride: 108 mmol/L (ref 98–111)
Creatinine, Ser: 2.36 mg/dL — ABNORMAL HIGH (ref 0.44–1.00)
GFR, Estimated: 22 mL/min — ABNORMAL LOW (ref >60)
Glucose, Bld: 83 mg/dL (ref 70–99)
Phosphorus: 2.7 mg/dL (ref 2.5–4.6)
Potassium: 3.9 mmol/L (ref 3.5–5.1)
Sodium: 139 mmol/L (ref 135–145)

## 2021-07-03 LAB — FOLATE: Folate: 11.1 ng/mL (ref >5.9)

## 2021-07-03 LAB — VITAMIN B12: Vitamin B-12: 2463 pg/mL — ABNORMAL HIGH (ref 180–914)

## 2021-07-03 LAB — C DIFFICILE QUICK SCREEN W PCR REFLEX
C Diff antigen: NEGATIVE
C Diff interpretation: NOT DETECTED
C Diff toxin: NEGATIVE

## 2021-07-03 LAB — AMMONIA: Ammonia: 40 umol/L — ABNORMAL HIGH (ref 9–35)

## 2021-07-03 LAB — IRON AND TIBC
Iron: 25 ug/dL — ABNORMAL LOW (ref 28–170)
Saturation Ratios: 16 % (ref 10.4–31.8)
TIBC: 152 ug/dL — ABNORMAL LOW (ref 250–450)
UIBC: 127 ug/dL

## 2021-07-03 LAB — FERRITIN: Ferritin: 281 ng/mL (ref 11–307)

## 2021-07-03 LAB — UREA NITROGEN, URINE: Urea Nitrogen, Ur: 149 mg/dL

## 2021-07-03 LAB — MAGNESIUM: Magnesium: 1.7 mg/dL (ref 1.7–2.4)

## 2021-07-03 MED ORDER — SODIUM CHLORIDE 0.9 % IV SOLN
2000.0000 mg | Freq: Once | INTRAVENOUS | Status: AC
  Administered 2021-07-03: 2000 mg via INTRAVENOUS
  Filled 2021-07-03: qty 20

## 2021-07-03 MED ORDER — ADULT MULTIVITAMIN W/MINERALS CH
1.0000 | ORAL_TABLET | Freq: Every day | ORAL | Status: DC
  Administered 2021-07-04 – 2021-07-12 (×9): 1 via ORAL
  Filled 2021-07-03 (×9): qty 1

## 2021-07-03 MED ORDER — KCL-LACTATED RINGERS-D5W 20 MEQ/L IV SOLN
INTRAVENOUS | Status: DC
  Filled 2021-07-03 (×6): qty 1000

## 2021-07-03 MED ORDER — BOOST / RESOURCE BREEZE PO LIQD CUSTOM
1.0000 | Freq: Three times a day (TID) | ORAL | Status: DC
  Administered 2021-07-03 – 2021-07-12 (×24): 1 via ORAL

## 2021-07-03 MED ORDER — LOPERAMIDE HCL 2 MG PO CAPS: 2.0000 mg | ORAL_CAPSULE | ORAL | Status: DC | PRN

## 2021-07-03 MED ORDER — MAGNESIUM SULFATE 2 GM/50ML IV SOLN
2.0000 g | Freq: Once | INTRAVENOUS | Status: AC
  Administered 2021-07-03: 2 g via INTRAVENOUS
  Filled 2021-07-03: qty 50

## 2021-07-03 MED ORDER — IOHEXOL 300 MG/ML  SOLN
50.0000 mL | Freq: Once | INTRAMUSCULAR | Status: AC | PRN
  Administered 2021-07-03: 8 mL

## 2021-07-03 MED ORDER — LEVETIRACETAM IN NACL 500 MG/100ML IV SOLN
500.0000 mg | Freq: Two times a day (BID) | INTRAVENOUS | Status: DC
  Administered 2021-07-04 – 2021-07-07 (×7): 500 mg via INTRAVENOUS
  Filled 2021-07-03 (×7): qty 100

## 2021-07-03 MED ORDER — ASCORBIC ACID 500 MG PO TABS
250.0000 mg | ORAL_TABLET | Freq: Two times a day (BID) | ORAL | Status: DC
  Administered 2021-07-03 – 2021-07-12 (×18): 250 mg via ORAL
  Filled 2021-07-03 (×18): qty 1

## 2021-07-03 NOTE — Progress Notes (Signed)
Checked in with patient at this time and she has already removed the Nasal CPAP. States she is not able to get her breath. Placed on at 1955. Daughter states she does not use CPAP at home.

## 2021-07-03 NOTE — Progress Notes (Signed)
EEG complete - results pending 

## 2021-07-03 NOTE — Evaluation (Signed)
Occupational Therapy Evaluation Patient Details Name: [name] MRN: [id number] DOB: 07/24/1951 Today's Date: 07/03/2021    History of Present Illness patient is a 70 year old female who presented to the hospital (recent d/c from [facility] to SNF for short term rehab) with episode of shakiness, weakness,and diarrhea.. patient was noted to have acute metabolic encephalopathy, acute kidney injury, and possible seizure. EEG completed on 7/18 indicated seizure. PMH: patient was recently at [facility] from 6[facility]10-7/8 for perforated diverticulitis with abscessm, anemia, CHF, DM, graves disease, HTN, obesity, stroke, seizures, thyroid disease.   Clinical Impression   Patient is a 70 year old female who was admitted for above. Patient's prior level was independent living at home alone. Currently patient is max A for supine to sit on edge of bed with HOB raised with increased time to process cues. Patietn was able to maintain sitting balance with min A sitting on edge of bed with increased time . Patients HR was noted to bed 118 sitting on edge of bed.  Patient was max A x 2 for standing on edge of bed with rolling walker to increase independence in ADL tasks. Patient tolerated standing for about 10 seconds with max A x 2 for repositioning in bed. Patients hear rate was noted to increase to 134 bpm with single stand.patient had no awareness of drain on R side of body during session. Patient was noted to have decreased time out of bed, decreased activity tolerance, decreased endurance, decreased standing balance and decreased ability to participate in ADLs. Patient would continue to benefit from skilled OT services at this time while admitted and after d/c to address noted deficits in order to improve overall safety and independence in ADLs.      Follow Up Recommendations  SNF    Equipment Recommendations  Other (comment) (defer to next venue)    Recommendations for Other Services       Precautions /  Restrictions Precautions Precautions: Fall Precaution Comments: monitor HR, seizure precautions, R side gravity drain, Restrictions Weight Bearing Restrictions: No      Mobility Bed Mobility Overal bed mobility: Needs Assistance Bed Mobility: Supine to Sit     Supine to sit: Max assist;HOB elevated          Transfers Overall transfer level: Needs assistance Equipment used: Rolling walker (2 wheeled) Transfers: Sit to/from Stand Sit to Stand: Max assist;+2 safety/equipment;+2 physical assistance              Balance Overall balance assessment: Needs assistance Sitting-balance support: Feet supported;Bilateral upper extremity supported Sitting balance-Leahy Scale: Fair     Standing balance support: Bilateral upper extremity supported Standing balance-Leahy Scale: Poor Standing balance comment: reliant on support in standing.                           ADL either performed or assessed with clinical judgement   ADL Overall ADL's : Needs assistance/impaired Eating/Feeding: Set up;Sitting   Grooming: Set up;Bed level   Upper Body Bathing: Bed level;Minimal assistance   Lower Body Bathing: Total assistance;Bed level   Upper Body Dressing : Minimal assistance;Bed level   Lower Body Dressing: Maximal assistance;Bed level   Toilet Transfer: Total assistance;+2 for physical assistance;+2 for safety/equipment   Toileting- Clothing Manipulation and Hygiene: Maximal assistance;+2 for safety/equipment;+2 for physical assistance       Functional mobility during ADLs: Maximal assistance;+2 for safety/equipment;+2 for physical assistance General ADL Comments: patient required increased assistance to  participate in tasks.     Vision         Perception     Praxis      Pertinent Vitals/Pain Faces Pain Scale: Hurts a little bit Pain Location: back Pain Descriptors / Indicators: Discomfort Pain Intervention(s): Monitored during session     Hand  Dominance Right   Extremity/Trunk Assessment Upper Extremity Assessment Upper Extremity Assessment: Overall WFL for tasks assessed   Lower Extremity Assessment Lower Extremity Assessment: Defer to PT evaluation       Communication Communication Communication: No difficulties   Cognition Arousal/Alertness: Awake/alert Behavior During Therapy: WFL for tasks assessed/performed Overall Cognitive Status: Impaired/Different from baseline Area of Impairment: Memory;Attention;Safety/judgement;Awareness                     Memory: Decreased short-term memory Following Commands: Follows one step commands inconsistently   Awareness: Emergent Problem Solving: Slow processing General Comments: patient takes increased time to process cues and follow commands.   General Comments  patients resting HR was 105 bpm. patients HR on edge of bed as 118 bpm. patients HR in standing was 134 bpm with one stand trial for about 10 seconds max a x 2    Exercises     Shoulder Instructions      Home Living Family/patient expects to be discharged to:: Skilled nursing facility Living Arrangements: Alone Available Help at Discharge: Family;Available PRN/intermittently Type of Home: Apartment Home Access: Level entry     Home Layout: One level               Home Equipment: Walker - 2 wheels;Cane - single point          Prior Functioning/Environment    Gait / Transfers Assistance Needed: Pt at her normal walks with a cane, drives, manages her own medications with family support. ADL's / Homemaking Assistance Needed: per pt report she was recently independent in all ADLs and IADLs.   Comments: daughter assists with all appointment scheduling ect " she helps with all that needs to be done".        OT Problem List: Decreased strength;Decreased range of motion;Decreased activity tolerance;Impaired balance (sitting and/or standing);Decreased knowledge of use of DME or AE;Decreased  cognition;Decreased safety awareness;Decreased knowledge of precautions;Obesity;Impaired UE functional use      OT Treatment/Interventions: Self-care/ADL training;DME and/or AE instruction;Therapeutic activities;Balance training;Therapeutic exercise;Energy conservation;Cognitive remediation/compensation    OT Goals(Current goals can be found in the care plan section) Acute Rehab OT Goals Patient Stated Goal: to get better OT Goal Formulation: With patient Time For Goal Achievement: 07/17/21 Potential to Achieve Goals: Good  OT Frequency: Min 2X/week   Barriers to D/C:    patient lives at home alone       Co-evaluation              AM-PAC OT "6 Clicks" Daily Activity     Outcome Measure Help from another person eating meals?: A Little Help from another person taking care of personal grooming?: A Little Help from another person toileting, which includes using toliet, bedpan, or urinal?: A Lot Help from another person bathing (including washing, rinsing, drying)?: A Lot Help from another person to put on and taking off regular upper body clothing?: A Lot Help from another person to put on and taking off regular lower body clothing?: A Lot 6 Click Score: 14   End of Session Equipment Utilized During Treatment: Gait belt;Rolling walker Nurse Communication: Other (comment) (nurse cleared patient for participation in therapy.)  Activity Tolerance: Patient tolerated treatment well Patient left: with bed alarm set;with call bell/phone within reach;in bed  OT Visit Diagnosis: Unsteadiness on feet (R26.81);Muscle weakness (generalized) (M62.81);Other symptoms and signs involving cognitive function                Time: 4128-7867 OT Time Calculation (min): 33 min Charges:  OT General Charges $OT Visit: 1 Visit OT Evaluation $OT Eval Low Complexity: 1 Low OT Treatments $Self Care/Home Management : 8-22 mins  Sharyn Blitz OTR/L, MS Acute Rehabilitation Department Office#  [phone] Pager# [phone]   [name] 07/03/2021, 1:08 PM

## 2021-07-03 NOTE — TOC Progression Note (Signed)
Transition of Care [facility]) - Progression Note    Patient Details  Name: [name] MRN: [id number] Date of Birth: 1951-12-03  Transition of [facility] CM/SW Contact  Geni Bers, RN Phone Number: 07/03/2021, 3:14 PM  Clinical Narrative:    Pt from [facility] Short Term and may return. Will need insurance auth.    Expected Discharge Plan: Skilled Nursing Facility Barriers to Discharge: No Barriers Identified  Expected Discharge Plan and Services Expected Discharge Plan: Skilled Nursing Facility   Discharge Planning Services: CM Consult   Living arrangements for the past 2 months: Skilled Nursing Facility                                       Social Determinants of Health (SDOH) Interventions    Readmission Risk Interventions No flowsheet data found.

## 2021-07-03 NOTE — Progress Notes (Signed)
PT Cancellation Note  Patient Details Name: [name] MRN: [id number] DOB: [birth year]   Cancelled Treatment:    Reason Eval/Treat Not Completed: Medical issues which prohibited therapy. Pt to xfer to [facility] for longterm EEG monitoring. Will hold PT for now.    Faye Ramsay, PT Acute Rehabilitation  Office: [phone] Pager: [phone]

## 2021-07-03 NOTE — Procedures (Signed)
Patient Name: [name]  MRN: [id number]  Epilepsy Attending: Charlsie Quest  Referring Physician/Provider: Dr Shauna Hugh Date: 07/03/2021 Duration: 24.11 mins  Patient history: 70 year old female with altered mental status and seizure-like activity.  EEG to evaluate for seizures.  Level of alertness: Awake, asleep  AEDs during EEG study: None  Technical aspects: This EEG study was done with scalp electrodes positioned according to the 10-20 International system of electrode placement. Electrical activity was acquired at a sampling rate of 500Hz  and reviewed with a high frequency filter of 70Hz  and a low frequency filter of 1Hz . EEG data were recorded continuously and digitally stored.   Description: The posterior dominant rhythm consists of 8-9 Hz activity of moderate voltage (25-35 uV) seen predominantly in posterior head regions, symmetric and reactive to eye opening and eye closing. Sleep was characterized by vertex waves, sleep spindles (12 to 14 Hz), maximal frontocentral region. Single seizure without clinical signs was noted on 07/03/2021 at 0953 arising from left parieto-occipital region, lasting 50 seconds.  Hyperventilation and photic stimulation were not performed.     ABNORMALITY -Seizure without clinical signs, left parieto-occipital region  IMPRESSION: This study showed one seizure without clinical signs on 07/03/2021 at 0953 arising from left parieto-occipital region, lasting 50 seconds.   Please consider long-term EEG monitoring if concern for ictal-interictal activity persists.   Neeraj Housand 

## 2021-07-03 NOTE — Progress Notes (Signed)
Abscess drain injection performed today. See full report. No evidence of residual abscess cavity or fistula. Contrast promptly backs up and leaks from skin site. Drain removed without difficulty.  Brayton El PA-C Interventional Radiology 07/03/2021 4:50 PM

## 2021-07-03 NOTE — Progress Notes (Signed)
Initial Nutrition Assessment  DOCUMENTATION CODES:   Morbid obesity  INTERVENTION:   Boost Breeze po TID, each supplement provides 250 kcal and 9 grams of protein  MVI po daily   Vitamin C 250mg  po BID   Pt at high refeed risk; recommend monitor potassium, magnesium and phosphorus labs daily until stable  NUTRITION DIAGNOSIS:   Inadequate oral intake related to acute illness as evidenced by other (comment) (pt on clear liquid diet).  GOAL:   Patient will meet greater than or equal to 90% of their needs  MONITOR:   PO intake, Supplement acceptance, Diet advancement, Labs, Weight trends, Skin, I & O's  REASON FOR ASSESSMENT:   Consult, Malnutrition Screening Tool Assessment of nutrition requirement/status  ASSESSMENT:   70 y/o female with h/o celiac disease, CHF, depression, DM, emphysema, Grave's disease, HTN, OSA, stroke, ulcer, PE, CKD III, GERD, seizure and recent admission from 6/10-7/8 for perforated diverticulitis requiring IR drain 6/21 and TPN who is now admitted with AKI, AMS and possible seizure.  RD working remotely.  Unable to reach pt by phone. Per chart review, pt with poor appetite and oral intake pta r/t nausea and vomiting. Pt with recent perforated diverticulitis; pt reports that she has been having difficulty with solid foods but reports that she drinking some liquids and protein shakes. Per RD note from 7/6, pt was eating 100% of meals prior to discharge. Pt is documented to be eating 50-100% of her clear liquid trays in hospital currently. Per chart, pt is down 46lbs(17%) in less than one year; 36lbs(13%) of this was lost since March which is significant. Per RD's last note, pt drinks Ensure or Glucerna. RD will order Boost breeze as pt currently on clear liquids. Pt is likely at high refeed risk. RD will attempt to obtain nutrition related history and exam at follow up. Per MD note, plan is for transfer to [facility] for continuous EEG and monitoring.     Medications reviewed and include: synthroid, protonix, LRS w/ 5% dextrose and KCl @75ml /hr, Mg sulfate  Labs reviewed: K 3.9 wnl, creat 2.36(H), Mg 1.7 wnl Iron 25(L), TIBC 152(L), ferritin 281, folate 11.1, B12 2463- 7/18 Hgb 8.2(L), Hct 26.1(L) Cbgs- 71, 75 x 24 hrs AIC 5.5- 7/17  NUTRITION - FOCUSED PHYSICAL EXAM: Unable to perform at this time   Diet Order:   Diet Order             Diet clear liquid Room service appropriate? Yes; Fluid consistency: Thin  Diet effective now                  EDUCATION NEEDS:   No education needs have been identified at this time  Skin:  Skin Assessment: Reviewed RN Assessment (MASD, Stage II buttocks, NPI R thigh)  Last BM:  7/17- type 7  Height:   Ht Readings from Last 1 Encounters:  07/02/21 5\' 2"  (1.575 m)    Weight:   Wt Readings from Last 1 Encounters:  06/12/21 106.2 kg    Ideal Body Weight:  50 kg  BMI:  Body mass index is 42.82 kg/m.  Estimated Nutritional Needs:   Kcal:  2000-2300kcal/day  Protein:  100-115g/day  Fluid:  1.5-1.8L/day  07/04/21 MS, RD, LDN Please refer to [facility] for RD and/or RD on-call/weekend/after hours pager

## 2021-07-03 NOTE — Progress Notes (Signed)
PROGRESS NOTE  [name] MLY:[id number] DOB: February 05, 1951   PCP: Ollen Bowl, MD  Patient is from: SNF  DOA: 07/01/2021 LOS: 2  Chief complaints:  Chief Complaint  Patient presents with   Seizures   Nausea     Brief Narrative / Interim history: 70 year old F with PMH of CKD-3A, DM-2, HTN, HLD, diastolic CHF, recent hospitalization at [facility] from 6/10-7/8 for perforated diverticulitis with abscess returning with lethargy, generalized weakness, diarrhea, poor p.o. intake and shaking episode and admitted AKI, acute metabolic encephalopathy and possible seizure.   CT abdomen and pelvis, CT head and renal ultrasound without significant finding.  Patient hospitalized at [facility] 6/10-7/8 for perforated diverticulitis with pelvic abscess that was treated with IR drain placement on 6/21.  Hospital course complicated by bilateral PE and GI bleed without clear source after EGD.  She had IVC placed and taken off [facility] and discharged to SNF.  Patient was started on IV fluids.  Cultures of C. difficile obtained.  EEG with subclinical seizure.  Transferred to [facility] for LTM and neurology care  Subjective: Seen and examined earlier this morning.  No major events overnight of this morning.  No complaints today.  Feels well.  He denies chest pain, dyspnea, UTI symptoms.  Still with diarrhea but no nausea, vomiting or abdominal pain.  Objective: Vitals:   07/02/21 1216 07/02/21 1602 07/02/21 1943 07/03/21 0624  BP: 104/73 (!) 95/59 118/79 128/78  Pulse: 89 95 100 88  Resp: 18 19 16 16   Temp: 98.4 F (36.9 C) 98.3 F (36.8 C) 98.2 F (36.8 C) 97.9 F (36.6 C)  TempSrc: Oral Oral Oral Oral  SpO2: 92% 95% 97% 96%  Height:  5\' 2"  (1.575 m)      Intake/Output Summary (Last 24 hours) at 07/03/2021 1215 Last data filed at 07/02/2021 2300 Gross per 24 hour  Intake 1192.5 ml  Output 500 ml  Net 692.5 ml   There were no vitals filed for this visit.  Examination:  GENERAL: No apparent  distress.  Nontoxic. HEENT: MMM.  Vision and hearing grossly intact.  NECK: Supple.  No apparent JVD.  RESP: On RA.  No IWOB.  Fair aeration bilaterally. CVS:  RRR. Heart sounds normal.  ABD/GI/GU: BS+. Abd soft, NTND.  MSK/EXT:  Moves extremities. No apparent deformity. No edema.  SKIN: no apparent skin lesion or wound NEURO: Awake and alert. Oriented x4 except to months.  No apparent focal neuro deficit. PSYCH: Calm. Normal affect.   Procedures:  None  Microbiology summarized: COVID-19 PCR nonreactive. Blood culture pending C. difficile screen pending  Assessment & Plan: AKI: Likely in the setting of poor p.o. intake and diarrhea although she has no azotemia.  She is also on Lasix and Protonix which might contribute.  CT abdomen and pelvis and renal 07/05/2021 without significant finding.  Improving with IV fluid. Recent Labs    06/14/21 0632 06/15/21 0500 06/16/21 0253 06/17/21 0450 06/19/21 0358 06/21/21 0445 06/23/21 0445 07/01/21 1508 07/02/21 0615 07/03/21 0342  BUN 16 11 6* <5* <5* 5* 8 13 14 12   CREATININE 0.98 0.76 0.84 0.73 0.91 1.01* 1.10* 3.49* 2.97* 2.36*  -Continue IV fluid -Continue holding Lasix -Recheck renal function in the morning  Acute metabolic encephalopathy: Likely due to dehydration, AKI, possibly Levaquin and seizure.  EEG with subclinical seizure.  No focal neurodeficit to suggest CVA.  CT head reassuring.  Oriented x4 except to months. -Minimize or avoid sedating medications -Reorientation and delirium precautions  Seizure-reportedly had  shaking prior to arrival.  No clinical seizure in the hospital.  EEG with subclinical seizure. -Transfer to [facility] for LTM and neurology care -Neurology starting Keppra.  History of perforated diverticulitis with pelvic abscess-CT abdomen and pelvis with complete evacuation of previously noted pelvic abscess.  Patient was previously discharged on p.o. Levaquin through 7/19.  -Discontinue Levaquin -IR  consulted-appreciate recommendations  Diarrhea: Unclear source.  Abdominal exam benign.  C. difficile negative. -Start as needed Imodium -Discontinue enteric precaution  History of bilateral PE-had IVC filter placed and anticoagulation stopped due to GI bleed.  No cardiopulmonary symptoms.  Controlled NIDDM-2: A1c 5.5% (from 9.4% on 04/22/2021).  Takes metformin at home Recent Labs  Lab 07/02/21 1915 07/02/21 2109 07/02/21 2352 07/03/21 0747 07/03/21 1118  GLUCAP 68* 80 71 71 75  -Discontinue CBG monitoring and SSI   Chronic diastolic CHF/severe PAH: TTE ordered 6/11 with LVEF of 65 to 70%, indeterminate DD, severely elevated PASP and RVSP of 65 mmHg, mild to mod AS.  Patient is on low-dose Lasix -Continue holding Lasix -Continue IV fluid -Closely monitor fluid and respiratory status while on IV fluid  Lactic acidosis: Likely due to dehydration, metformin, seizure and AKI.  Resolved  Moderate persistent asthma: Stable. -Continue home breathing treatments  OSA not on CPAP at home  Hypothyroidism -Continue home Synthroid  Hypokalemia/hypomagnesemia: Resolved. -K-Dur to 20 mill equivalent x1 given renal failure  Normocytic anemia: H&H stable. History of GI bleed-recent EGD unrevealing. Recent Labs    06/15/21 0045 06/15/21 0500 06/15/21 1810 06/16/21 0253 06/17/21 0450 06/19/21 0358 06/21/21 0603 06/23/21 0445 07/01/21 1508 07/02/21 0615  HGB 8.5* 8.4* 9.1* 8.5* 8.6* 8.3* 8.5* 8.3* 9.0* 8.2*  -Continue monitoring   Debility/generalized weakness -PT/OT   Morbid obesity Body mass index is 42.82 kg/m.       Pressure skin injuries: POA Pressure Injury 07/02/21 Buttocks Left Stage 2 -  Partial thickness loss of dermis presenting as a shallow open injury with a red, pink wound bed without slough. (Active)  07/02/21 0923  Location: Buttocks  Location Orientation: Left  Staging: Stage 2 -  Partial thickness loss of dermis presenting as a shallow open injury  with a red, pink wound bed without slough.  Wound Description (Comments):   Present on Admission: Yes   DVT prophylaxis:  SCDs Start: 07/02/21 0029  Code Status: Full code.  Confirmed with patient and patient's daughter at bedside Family Communication: Updated patient's daughter at bedside Level of care: Telemetry Medical Status is: Inpatient  Remains inpatient appropriate because:Persistent severe electrolyte disturbances, Altered mental status, Unsafe d/c plan, IV treatments appropriate due to intensity of illness or inability to take PO, and Inpatient level of care appropriate due to severity of illness  Dispo: The patient is from: SNF              Anticipated d/c is to: SNF              Patient currently is not medically stable to d/c.   Difficult to place patient No       Consultants:  IR Neurology   Sch Meds:  Scheduled Meds:  fluticasone furoate-vilanterol  1 puff Inhalation Daily   levothyroxine  112 mcg Oral Q0600   pantoprazole (PROTONIX) IV  40 mg Intravenous Q12H   Continuous Infusions:  dextrose 5% lactated ringers with KCl 20 mEq/L 75 mL/hr at 07/03/21 1114   levETIRAcetam     [START ON 07/04/2021] levETIRAcetam     magnesium sulfate bolus IVPB  PRN Meds:.acetaminophen **OR** acetaminophen, albuterol, loperamide, ondansetron **OR** ondansetron (ZOFRAN) IV  Antimicrobials: Anti-infectives (From admission, onward)    Start     Dose/Rate Route Frequency Ordered Stop   07/02/21 2200  ceFEPIme (MAXIPIME) 2 g in sodium chloride 0.9 % 100 mL IVPB  Status:  Discontinued        2 g 200 mL/hr over 30 Minutes Intravenous Every 24 hours 07/01/21 2358 07/02/21 0003   07/02/21 1000  levofloxacin (LEVAQUIN) tablet 750 mg  Status:  Discontinued       Note to Pharmacy: Patient taking differently: X 10 day starting 07/09 - 07/19     750 mg Oral Daily 07/02/21 0032 07/02/21 0154   07/01/21 2145  ceFEPIme (MAXIPIME) 2 g in sodium chloride 0.9 % 100 mL IVPB        2  g 200 mL/hr over 30 Minutes Intravenous  Once 07/01/21 2134 07/01/21 2325   07/01/21 2130  metroNIDAZOLE (FLAGYL) IVPB 500 mg  Status:  Discontinued        500 mg 100 mL/hr over 60 Minutes Intravenous  Once 07/01/21 2125 07/02/21 0003        I have personally reviewed the following labs and images: CBC: Recent Labs  Lab 07/01/21 1508 07/02/21 0615  WBC 10.0 9.4  NEUTROABS 7.7 6.6  HGB 9.0* 8.2*  HCT 29.1* 26.1*  MCV 101.4* 100.4*  PLT 212 183   BMP &GFR Recent Labs  Lab 07/01/21 1508 07/02/21 0615 07/03/21 0342  NA 138 139 139  K 3.6 3.4* 3.9  CL 104 104 108  CO2 24 23 22   GLUCOSE 75 57* 83  BUN 13 14 12   CREATININE 3.49* 2.97* 2.36*  CALCIUM 7.6* 7.7* 7.6*  MG  --  1.0* 1.7  PHOS  --   --  2.7   CrCl cannot be calculated (Unknown ideal weight.). Liver & Pancreas: Recent Labs  Lab 07/01/21 1508 07/02/21 0615 07/03/21 0342  AST 27 21  --   ALT 17 15  --   ALKPHOS 58 50  --   BILITOT 0.8 0.9  --   PROT 5.4* 4.8*  --   ALBUMIN 2.7* 2.5* 2.2*   No results for input(s): LIPASE, AMYLASE in the last 168 hours. Recent Labs  Lab 07/03/21 0342  AMMONIA 40*   Diabetic: Recent Labs    07/02/21 0615  HGBA1C 5.5   Recent Labs  Lab 07/02/21 1915 07/02/21 2109 07/02/21 2352 07/03/21 0747 07/03/21 1118  GLUCAP 68* 80 71 71 75   Cardiac Enzymes: No results for input(s): CKTOTAL, CKMB, CKMBINDEX, TROPONINI in the last 168 hours. No results for input(s): PROBNP in the last 8760 hours. Coagulation Profile: Recent Labs  Lab 07/01/21 1508  INR 1.3*   Thyroid Function Tests: Recent Labs    07/02/21 0615  TSH 5.312*   Lipid Profile: No results for input(s): CHOL, HDL, LDLCALC, TRIG, CHOLHDL, LDLDIRECT in the last 72 hours. Anemia Panel: Recent Labs    07/02/21 0615 07/03/21 0342  VITAMINB12 2,603* 2,463*  FOLATE 13.5 11.1  FERRITIN  --  281  TIBC  --  152*  IRON  --  25*  RETICCTPCT  --  1.3   Urine analysis:    Component Value  Date/Time   COLORURINE YELLOW 05/26/2021 2115   APPEARANCEUR CLEAR 05/26/2021 2115   LABSPEC 1.017 05/26/2021 2115   PHURINE 5.0 05/26/2021 2115   GLUCOSEU 50 (A) 05/26/2021 2115   GLUCOSEU NEGATIVE 07/13/2020 0900   HGBUR SMALL (A) 05/26/2021  2115   BILIRUBINUR NEGATIVE 05/26/2021 2115   KETONESUR NEGATIVE 05/26/2021 2115   PROTEINUR NEGATIVE 05/26/2021 2115   UROBILINOGEN 0.2 07/13/2020 0900   NITRITE NEGATIVE 05/26/2021 2115   LEUKOCYTESUR TRACE (A) 05/26/2021 2115   Sepsis Labs: Invalid input(s): PROCALCITONIN, LACTICIDVEN  Microbiology: Recent Results (from the past 240 hour(s))  Resp Panel by RT-PCR (Flu A&B, Covid) Nasopharyngeal Swab     Status: None   Collection Time: 06/23/21 12:43 PM   Specimen: Nasopharyngeal Swab; Nasopharyngeal(NP) swabs in vial transport medium  Result Value Ref Range Status   SARS Coronavirus 2 by RT PCR NEGATIVE NEGATIVE Final    Comment: (NOTE) SARS-CoV-2 target nucleic acids are NOT DETECTED.  The SARS-CoV-2 RNA is generally detectable in upper respiratory specimens during the acute phase of infection. The lowest concentration of SARS-CoV-2 viral copies this assay can detect is 138 copies/mL. A negative result does not preclude SARS-Cov-2 infection and should not be used as the sole basis for treatment or other patient management decisions. A negative result may occur with  improper specimen collection/handling, submission of specimen other than nasopharyngeal swab, presence of viral mutation(s) within the areas targeted by this assay, and inadequate number of viral copies(<138 copies/mL). A negative result must be combined with clinical observations, patient history, and epidemiological information. The expected result is Negative.  Fact Sheet for Patients:  BloggerCourse.com  Fact Sheet for Healthcare Providers:  SeriousBroker.it  This test is no t yet approved or cleared by the  [place] FDA and  has been authorized for detection and/or diagnosis of SARS-CoV-2 by FDA under an Emergency Use Authorization (EUA). This EUA will remain  in effect (meaning this test can be used) for the duration of the COVID-19 declaration under Section 564(b)(1) of the Act, 21 U.S.C.section 360bbb-3(b)(1), unless the authorization is terminated  or revoked sooner.       Influenza A by PCR NEGATIVE NEGATIVE Final   Influenza B by PCR NEGATIVE NEGATIVE Final    Comment: (NOTE) The Xpert Xpress SARS-CoV-2/FLU/RSV plus assay is intended as an aid in the diagnosis of influenza from Nasopharyngeal swab specimens and should not be used as a sole basis for treatment. Nasal washings and aspirates are unacceptable for Xpert Xpress SARS-CoV-2/FLU/RSV testing.  Fact Sheet for Patients: BloggerCourse.com  Fact Sheet for Healthcare Providers: SeriousBroker.it  This test is not yet approved or cleared by the [place] FDA and has been authorized for detection and/or diagnosis of SARS-CoV-2 by FDA under an Emergency Use Authorization (EUA). This EUA will remain in effect (meaning this test can be used) for the duration of the COVID-19 declaration under Section 564(b)(1) of the Act, 21 U.S.C. section 360bbb-3(b)(1), unless the authorization is terminated or revoked.  Performed at [facility], [address], [place], [place] [address]   Blood culture (routine single)     Status: None (Preliminary result)   Collection Time: 07/01/21 10:30 PM   Specimen: BLOOD  Result Value Ref Range Status   Specimen Description   Final    BLOOD Performed at [facility], [address], [place], [place] [address]    Special Requests   Final    BOTTLES DRAWN AEROBIC AND ANAEROBIC Blood Culture adequate volume Performed at [facility], [address], [place], [place] [address]    Culture   Final     NO GROWTH 1 DAY Performed at [facility], [address], [place], [place] [address]    Report Status  PENDING  Incomplete  SARS CORONAVIRUS 2 (TAT 6-24 HRS) Nasopharyngeal Nasopharyngeal Swab     Status: None   Collection Time: 07/01/21 10:50 PM   Specimen: Nasopharyngeal Swab  Result Value Ref Range Status   SARS Coronavirus 2 NEGATIVE NEGATIVE Final    Comment: (NOTE) SARS-CoV-2 target nucleic acids are NOT DETECTED.  The SARS-CoV-2 RNA is generally detectable in upper and lower respiratory specimens during the acute phase of infection. Negative results do not preclude SARS-CoV-2 infection, do not rule out co-infections with other pathogens, and should not be used as the sole basis for treatment or other patient management decisions. Negative results must be combined with clinical observations, patient history, and epidemiological information. The expected result is Negative.  Fact Sheet for Patients: HairSlick.no  Fact Sheet for Healthcare Providers: quierodirigir.com  This test is not yet approved or cleared by the [place] FDA and  has been authorized for detection and/or diagnosis of SARS-CoV-2 by FDA under an Emergency Use Authorization (EUA). This EUA will remain  in effect (meaning this test can be used) for the duration of the COVID-19 declaration under Se ction 564(b)(1) of the Act, 21 U.S.C. section 360bbb-3(b)(1), unless the authorization is terminated or revoked sooner.  Performed at [facility], [address], [place], [place] [address]   MRSA Next Gen by PCR, Nasal     Status: None   Collection Time: 07/02/21 11:03 AM   Specimen: Nasal Mucosa; Nasal Swab  Result Value Ref Range Status   MRSA by PCR Next Gen NOT DETECTED NOT DETECTED Final    Comment: (NOTE) The GeneXpert MRSA Assay (FDA approved for NASAL specimens only), is one component of a comprehensive MRSA colonization  surveillance program. It is not intended to diagnose MRSA infection nor to guide or monitor treatment for MRSA infections. Test performance is not FDA approved in patients less than 25 years old. Performed at [facility], [address], [place], [place] [address]   C Difficile Quick Screen w PCR reflex     Status: None   Collection Time: July 07, 2021 10:15 AM   Specimen: STOOL  Result Value Ref Range Status   C Diff antigen NEGATIVE NEGATIVE Final   C Diff toxin NEGATIVE NEGATIVE Final   C Diff interpretation No C. difficile detected.  Final    Comment: Performed at [facility], [address], [place], [place] [address]    Radiology Studies: EEG adult  Result Date: 07-07-2021 Charlsie Quest, MD     2021/07/07 10:37 AM Patient Name: Gladyes Kudo MRN: [id number] Epilepsy Attending: Charlsie Quest Referring Physician/Provider: Dr Shauna Hugh Date: Jul 07, 2021 Duration: 24.11 mins Patient history: 70 year old female with altered mental status and seizure-like activity.  EEG to evaluate for seizures. Level of alertness: Awake, asleep AEDs during EEG study: None Technical aspects: This EEG study was done with scalp electrodes positioned according to the 10-20 International system of electrode placement. Electrical activity was acquired at a sampling rate of  and reviewed with a high frequency filter of  and a low frequency filter of . EEG data were recorded continuously and digitally stored. Description: The posterior dominant rhythm consists of 8-9 Hz activity of moderate voltage (25-35 uV) seen predominantly in posterior head regions, symmetric and reactive to eye opening and eye closing. Sleep was characterized by vertex waves, sleep spindles (12 to 14 Hz), maximal frontocentral region. Single seizure without clinical signs was noted on Jul 07, 2021 at 0953 arising from left parieto-occipital  region, lasting 50 seconds.  Hyperventilation and  photic stimulation were not performed.   ABNORMALITY -Seizure without clinical signs, left parieto-occipital region IMPRESSION: This study showed one seizure without clinical signs on 07/03/2021 at 0953 arising from left parieto-occipital region, lasting 50 seconds. Please consider long-term EEG monitoring if concern for ictal-interictal activity persists. Priyanka Annabelle Harman Yadav      Neelah Mannings T. Merriel Zinger [facility]  If 7PM-7AM, please contact night-coverage [email or web address] 07/03/2021, 12:15 PM

## 2021-07-04 ENCOUNTER — Other Ambulatory Visit: Payer: Medicare HMO

## 2021-07-04 ENCOUNTER — Inpatient Hospital Stay: Admission: RE | Admit: 2021-07-04 | Payer: Medicare HMO | Source: Ambulatory Visit

## 2021-07-04 DIAGNOSIS — R197 Diarrhea, unspecified: Secondary | ICD-10-CM | POA: Diagnosis not present

## 2021-07-04 DIAGNOSIS — N179 Acute kidney failure, unspecified: Secondary | ICD-10-CM | POA: Diagnosis not present

## 2021-07-04 DIAGNOSIS — I2699 Other pulmonary embolism without acute cor pulmonale: Secondary | ICD-10-CM | POA: Diagnosis not present

## 2021-07-04 DIAGNOSIS — I5032 Chronic diastolic (congestive) heart failure: Secondary | ICD-10-CM | POA: Diagnosis not present

## 2021-07-04 DIAGNOSIS — R569 Unspecified convulsions: Secondary | ICD-10-CM | POA: Diagnosis not present

## 2021-07-04 LAB — CBC WITH DIFFERENTIAL/PLATELET
Abs Immature Granulocytes: 0.04 10*3/uL (ref 0.00–0.07)
Basophils Absolute: 0.1 10*3/uL (ref 0.0–0.1)
Basophils Relative: 1 %
Eosinophils Absolute: 0.2 10*3/uL (ref 0.0–0.5)
Eosinophils Relative: 2 %
HCT: 29.7 % — ABNORMAL LOW (ref 36.0–46.0)
Hemoglobin: 9.2 g/dL — ABNORMAL LOW (ref 12.0–15.0)
Immature Granulocytes: 0 %
Lymphocytes Relative: 15 %
Lymphs Abs: 1.5 10*3/uL (ref 0.7–4.0)
MCH: 31.2 pg (ref 26.0–34.0)
MCHC: 31 g/dL (ref 30.0–36.0)
MCV: 100.7 fL — ABNORMAL HIGH (ref 80.0–100.0)
Monocytes Absolute: 1 10*3/uL (ref 0.1–1.0)
Monocytes Relative: 11 %
Neutro Abs: 6.8 10*3/uL (ref 1.7–7.7)
Neutrophils Relative %: 71 %
Platelets: 204 10*3/uL (ref 150–400)
RBC: 2.95 MIL/uL — ABNORMAL LOW (ref 3.87–5.11)
RDW: 18.2 % — ABNORMAL HIGH (ref 11.5–15.5)
WBC: 9.6 10*3/uL (ref 4.0–10.5)
nRBC: 0 % (ref 0.0–0.2)

## 2021-07-04 LAB — URINALYSIS, ROUTINE W REFLEX MICROSCOPIC
Bacteria, UA: NONE SEEN
Bilirubin Urine: NEGATIVE
Glucose, UA: NEGATIVE mg/dL
Hgb urine dipstick: NEGATIVE
Ketones, ur: NEGATIVE mg/dL
Nitrite: NEGATIVE
Protein, ur: NEGATIVE mg/dL
Specific Gravity, Urine: 1.012 (ref 1.005–1.030)
pH: 5 (ref 5.0–8.0)

## 2021-07-04 LAB — COMPREHENSIVE METABOLIC PANEL
ALT: 21 U/L (ref 0–44)
AST: 45 U/L — ABNORMAL HIGH (ref 15–41)
Albumin: 2.5 g/dL — ABNORMAL LOW (ref 3.5–5.0)
Alkaline Phosphatase: 63 U/L (ref 38–126)
Anion gap: 7 (ref 5–15)
BUN: 10 mg/dL (ref 8–23)
CO2: 23 mmol/L (ref 22–32)
Calcium: 7.8 mg/dL — ABNORMAL LOW (ref 8.9–10.3)
Chloride: 107 mmol/L (ref 98–111)
Creatinine, Ser: 1.87 mg/dL — ABNORMAL HIGH (ref 0.44–1.00)
GFR, Estimated: 29 mL/min — ABNORMAL LOW (ref >60)
Glucose, Bld: 115 mg/dL — ABNORMAL HIGH (ref 70–99)
Potassium: 4 mmol/L (ref 3.5–5.1)
Sodium: 137 mmol/L (ref 135–145)
Total Bilirubin: 0.7 mg/dL (ref 0.3–1.2)
Total Protein: 5.1 g/dL — ABNORMAL LOW (ref 6.5–8.1)

## 2021-07-04 LAB — PHOSPHORUS: Phosphorus: 2.6 mg/dL (ref 2.5–4.6)

## 2021-07-04 LAB — MAGNESIUM: Magnesium: 1.9 mg/dL (ref 1.7–2.4)

## 2021-07-04 NOTE — Progress Notes (Signed)
PROGRESS NOTE  [name] ZOX:[id number] DOB: 1951/12/12   PCP: Ollen BowlPahwani, Rinka R, MD  Patient is from: SNF  DOA: 07/01/2021 LOS: 3  Chief complaints:  Chief Complaint  Patient presents with   Seizures   Nausea     Brief Narrative / Interim history: 70 year old F with PMH of CKD-3A, DM-2, HTN, HLD, diastolic CHF, recent hospitalization [facility] from [facility]-7/8 for perforated diverticulitis with abscess returning with lethargy, generalized weakness, diarrhea, poor p.o. intake and shaking episode and admitted AKI, acute metabolic encephalopathy and possible seizure.   CT abdomen and pelvis, CT head and renal ultrasound without significant finding.  Patient [facility] for perforated diverticulitis with pelvic abscess that was treated with IR drain placement on 6/21.  Hospital course complicated by bilateral PE and GI bleed without clear source after EGD.  She had IVC placed and taken off [facility] and discharged to SNF.  Patient was started on IV fluids.  Cultures of C. difficile obtained.  EEG with subclinical seizure.  [facility] and neurology care  Subjective: Seen and examined earlier this morning.  No major events overnight of this morning.  [facility].  She is a little bit sleepy this morning likely from Keppra.  No complaints.  She denies headache, vision change, chest pain, dyspnea, GI or UTI symptoms.  Patient's daughter at bedside.  Objective: Vitals:   07/03/21 1955 07/03/21 2113 07/04/21 0624 07/04/21 0822  BP:  113/72 (!) 106/57   Pulse: 91 98 88   Resp:  20 16   Temp:  98.5 F (36.9 C) 98.7 F (37.1 C)   TempSrc:  Oral Oral   SpO2: 96% 95% 98% 93%  Height:        Intake/Output Summary (Last 24 hours) at 07/04/2021 1158 Last data filed at 07/04/2021 0830 Gross per 24 hour  Intake 2248.74 ml  Output 850 ml  Net 1398.74 ml   There were no vitals filed for this  visit.  Examination:  GENERAL: No apparent distress.  Nontoxic. HEENT: MMM.  Vision and hearing grossly intact.  NECK: Supple.  No apparent JVD.  RESP: On room air.  No IWOB.  Fair aeration bilaterally. CVS:  RRR. Heart sounds normal.  ABD/GI/GU: BS+. Abd soft, NTND.  MSK/EXT:  Moves extremities. No apparent deformity. No edema.  SKIN: no apparent skin lesion or wound NEURO: Alert her sleepy but wakes to voice easily.  Oriented appropriately.  No apparent focal neuro deficit. PSYCH: Calm. Normal affect.   Procedures:  None  Microbiology summarized: COVID-19 PCR nonreactive. Blood culture pending C. difficile screen pending  Assessment & Plan: AKI: Likely in the setting of poor p.o. intake and diarrhea although she has no azotemia.  She is also on Lasix and Protonix which might contribute.  CT abdomen and pelvis and renal US without significant finding.  Improving with IV fluid. Recent Labs    06/15/21 0500 06/16/21 0253 06/17/21 0450 06/19/21 0358 06/21/21 0445 06/23/21 0445 07/01/21 1508 07/02/21 0615 07/03/21 0342 07/04/21 1037  BUN 11 6* <5* <5* 5* 8 13 14 12 10   CREATININE 0.76 0.84 0.73 0.91 1.01* 1.10* 3.49* 2.97* 2.36* 1.87*  -Continue IV fluid -Continue holding Lasix -Recheck renal function in the morning.  Acute metabolic encephalopathy: Likely due to dehydration, AKI, possibly Levaquin and seizure.  EEG with subclinical seizure.  No focal neurodeficit to suggest CVA.  CT head reassuring.  Oriented x4. -  Minimize or avoid sedating medications -Reorientation and delirium precautions  Shaking episode/concern for seizure-reportedly had shaking prior to arrival.  No clinical seizure in the hospital.  EEG concerning for subclinical seizure. -Neurology started Keppra. -[facility] and [facility]y care  History of perforated diverticulitis with pelvic abscess-CT abdomen and pelvis with complete evacuation of previously noted pelvic abscess.  Drain  removed by IR on 7/18. -Discontinued Levaquin given clinical improvement and complete evacuation of abscess.  Diarrhea:  Abdominal exam benign.  C. difficile negative.  Improved. 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  No cardiopulmonary symptoms.  Controlled NIDDM-2: A1c 5.5% (from 9.4% on 04/22/2021).  Takes metformin at home Recent Labs  Lab 07/02/21 2352 07/03/21 0747 07/03/21 1118 07/03/21 1709 07/03/21 2110  GLUCAP 71 71 75 86 122*  -Discontinue CBG monitoring and SSI -Check glucose with daily labs while on dextrose fluid   Chronic diastolic CHF/severe PAH: TTE ordered 6/11 with LVEF of 65 to 70%, indeterminate DD, severely elevated PASP and RVSP of 65 mmHg, mild to mod AS.  Patient is on low-dose Lasix -Continue holding Lasix -Closely monitor fluid and respiratory status while on IV fluid  Lactic acidosis: Likely due to dehydration, metformin, seizure and AKI.  Resolved  Moderate persistent asthma: Stable. -Continue home breathing treatments  OSA not on CPAP at home.  Not tolerating CPAP -Discontinue nightly CPAP  Hypothyroidism -Continue home Synthroid  Hypokalemia/hypomagnesemia: Resolved.  Normocytic anemia: H&H stable. History of GI bleed-recent EGD unrevealing. Recent Labs    06/15/21 0500 06/15/21 1810 06/16/21 0253 06/17/21 0450 06/19/21 0358 06/21/21 0603 06/23/21 0445 07/01/21 1508 07/02/21 0615 07/04/21 1037  HGB 8.4* 9.1* 8.5* 8.6* 8.3* 8.5* 8.3* 9.0* 8.2* 9.2*  -Continue monitoring  Debility/generalized weakness -PT/OT recommended SNF.   Morbid obesity Body mass index is 42.82 kg/m. Nutrition Problem: Inadequate oral intake Etiology: acute illness Signs/Symptoms: other (comment) (pt on clear liquid diet)   Pressure skin injuries: POA Pressure Injury 07/02/21 Buttocks Left Stage 2 -  Partial thickness loss of dermis presenting as a shallow open injury with a red,  pink wound bed without slough. (Active)  07/02/21 0923  Location: Buttocks  Location Orientation: Left  Staging: Stage 2 -  Partial thickness loss of dermis presenting as a shallow open injury with a red, pink wound bed without slough.  Wound Description (Comments):   Present on Admission: Yes   DVT prophylaxis:  SCDs Start: 07/02/21 0029  Code Status: Full code.  Confirmed with patient and patient's daughter  Family Communication: Updated patient's daughter at bedside Level of care: Telemetry Medical Status is: Inpatient  Remains inpatient appropriate because:Ongoing diagnostic testing needed not appropriate for outpatient work up, Unsafe d/c plan, IV treatments appropriate due to intensity of illness or inability to take PO, and Inpatient level of care appropriate due to severity of illness  Dispo: The patient is from: SNF              Anticipated d/c is to: SNF              Patient currently is not medically stable to d/c.   Difficult to place patient No       Consultants:  IR-signed off Neurology-following   Sch Meds:  Scheduled Meds:  vitamin C  250 mg Oral BID   feeding supplement  1 Container Oral TID BM   fluticasone furoate-vilanterol  1 puff Inhalation Daily   levothyroxine  112 mcg Oral Q0600   multivitamin with minerals  1 tablet Oral Daily   pantoprazole (PROTONIX) IV  40 mg Intravenous Q12H   Continuous Infusions:  dextrose 5% lactated ringers with KCl 20 mEq/L 75 mL/hr at 07/04/21 0958   levETIRAcetam Stopped (07/04/21 0125)   PRN Meds:.acetaminophen **OR** acetaminophen, albuterol, loperamide, ondansetron **OR** ondansetron (ZOFRAN) IV  Antimicrobials: Anti-infectives (From admission, onward)    Start     Dose/Rate Route Frequency Ordered Stop   07/02/21 2200  ceFEPIme (MAXIPIME) 2 g in sodium chloride 0.9 % 100 mL IVPB  Status:  Discontinued        2 g 200 mL/hr over 30 Minutes Intravenous Every 24 hours 07/01/21 2358 07/02/21 0003   07/02/21  1000  levofloxacin (LEVAQUIN) tablet 750 mg  Status:  Discontinued       Note to Pharmacy: Patient taking differently: X 10 day starting 07/09 - 07/19     750 mg Oral Daily 07/02/21 0032 07/02/21 0154   07/01/21 2145  ceFEPIme (MAXIPIME) 2 g in sodium chloride 0.9 % 100 mL IVPB        2 g 200 mL/hr over 30 Minutes Intravenous  Once 07/01/21 2134 07/01/21 2325   07/01/21 2130  metroNIDAZOLE (FLAGYL) IVPB 500 mg  Status:  Discontinued        500 mg 100 mL/hr over 60 Minutes Intravenous  Once 07/01/21 2125 07/02/21 0003        I have personally reviewed the following labs and images: CBC: Recent Labs  Lab 07/01/21 1508 07/02/21 0615 07/04/21 1037  WBC 10.0 9.4 9.6  NEUTROABS 7.7 6.6 6.8  HGB 9.0* 8.2* 9.2*  HCT 29.1* 26.1* 29.7*  MCV 101.4* 100.4* 100.7*  PLT 212 183 204   BMP &GFR Recent Labs  Lab 07/01/21 1508 07/02/21 0615 07/03/21 0342 07/04/21 1037  NA 138 139 139 137  K 3.6 3.4* 3.9 4.0  CL 104 104 108 107  CO2 24 23 22 23   GLUCOSE 75 57* 83 115*  BUN 13 14 12 10   CREATININE 3.49* 2.97* 2.36* 1.87*  CALCIUM 7.6* 7.7* 7.6* 7.8*  MG  --  1.0* 1.7 1.9  PHOS  --   --  2.7 2.6   CrCl cannot be calculated (Unknown ideal weight.). Liver & Pancreas: Recent Labs  Lab 07/01/21 1508 07/02/21 0615 07/03/21 0342 07/04/21 1037  AST 27 21  --  45*  ALT 17 15  --  21  ALKPHOS 58 50  --  63  BILITOT 0.8 0.9  --  0.7  PROT 5.4* 4.8*  --  5.1*  ALBUMIN 2.7* 2.5* 2.2* 2.5*   No results for input(s): LIPASE, AMYLASE in the last 168 hours. Recent Labs  Lab 07/03/21 0342  AMMONIA 40*   Diabetic: Recent Labs    07/02/21 0615  HGBA1C 5.5   Recent Labs  Lab 07/02/21 2352 07/03/21 0747 07/03/21 1118 07/03/21 1709 07/03/21 2110  GLUCAP 71 71 75 86 122*   Cardiac Enzymes: No results for input(s): CKTOTAL, CKMB, CKMBINDEX, TROPONINI in the last 168 hours. No results for input(s): PROBNP in the last 8760 hours. Coagulation Profile: Recent Labs  Lab  07/01/21 1508  INR 1.3*   Thyroid Function Tests: Recent Labs    07/02/21 0615  TSH 5.312*   Lipid Profile: No results for input(s): CHOL, HDL, LDLCALC, TRIG, CHOLHDL, LDLDIRECT in the last 72 hours. Anemia Panel: Recent Labs    07/02/21 0615 07/03/21 0342  VITAMINB12 2,603* 2,463*  FOLATE 13.5  11.1  FERRITIN  --  281  TIBC  --  152*  IRON  --  25*  RETICCTPCT  --  1.3   Urine analysis:    Component Value Date/Time   COLORURINE YELLOW 05/26/2021 2115   APPEARANCEUR CLEAR 05/26/2021 2115   LABSPEC 1.017 05/26/2021 2115   PHURINE 5.0 05/26/2021 2115   GLUCOSEU 50 (A) 05/26/2021 2115   GLUCOSEU NEGATIVE 07/13/2020 0900   HGBUR SMALL (A) 05/26/2021 2115   BILIRUBINUR NEGATIVE 05/26/2021 2115   KETONESUR NEGATIVE 05/26/2021 2115   PROTEINUR NEGATIVE 05/26/2021 2115   UROBILINOGEN 0.2 07/13/2020 0900   NITRITE NEGATIVE 05/26/2021 2115   LEUKOCYTESUR TRACE (A) 05/26/2021 2115   Sepsis Labs: Invalid input(s): PROCALCITONIN, LACTICIDVEN  Microbiology: Recent Results (from the past 240 hour(s))  Blood culture (routine single)     Status: None (Preliminary result)   Collection Time: 07/01/21 10:30 PM   Specimen: BLOOD  Result Value Ref Range Status   Specimen Description   Final    BLOOD Performed at [facility], [address] [place], [place] [address]    Special Requests   Final    BOTTLES DRAWN AEROBIC AND ANAEROBIC Blood Culture adequate volume Performed at [facility], [address] [place], [place] [address]    Culture   Final    NO GROWTH 2 DAYS Performed at [facility], [address] [place], [place] [address]    Report Status PENDING  Incomplete  SARS CORONAVIRUS 2 (TAT 6-24 HRS) Nasopharyngeal Nasopharyngeal Swab     Status: None   Collection Time: 07/01/21 10:50 PM   Specimen: Nasopharyngeal Swab  Result Value Ref Range Status   SARS Coronavirus 2 NEGATIVE NEGATIVE Final    Comment:  (NOTE) SARS-CoV-2 target nucleic acids are NOT DETECTED.  The SARS-CoV-2 RNA is generally detectable in upper and lower respiratory specimens during the acute phase of infection. Negative results do not preclude SARS-CoV-2 infection, do not rule out co-infections with other pathogens, and should not be used as the sole basis for treatment or other patient management decisions. Negative results must be combined with clinical observations, patient history, and epidemiological information. The expected result is Negative.  Fact Sheet for Patients: HairSlick.no  Fact Sheet for Healthcare Providers: quierodirigir.com  This test is not yet approved or cleared by the [place] FDA and  has been authorized for detection and/or diagnosis of SARS-CoV-2 by FDA under an Emergency Use Authorization (EUA). This EUA will remain  in effect (meaning this test can be used) for the duration of the COVID-19 declaration under Se ction 564(b)(1) of the Act, 21 U.S.C. section 360bbb-3(b)(1), unless the authorization is terminated or revoked sooner.  Performed at [facility], [address] [place], [place] [address]   MRSA Next Gen by PCR, Nasal     Status: None   Collection Time: 07/02/21 11:03 AM   Specimen: Nasal Mucosa; Nasal Swab  Result Value Ref Range Status   MRSA by PCR Next Gen NOT DETECTED NOT DETECTED Final    Comment: (NOTE) The GeneXpert MRSA Assay (FDA approved for NASAL specimens only), is one component of a comprehensive MRSA colonization surveillance program. It is not intended to diagnose MRSA infection nor to guide or monitor treatment for MRSA infections. Test performance is not FDA approved in patients less than 34 years old. Performed at [facility], [address] [place], [place] [address]   C Difficile Quick Screen w PCR reflex  Status: None   Collection Time: 07/03/21 10:15 AM    Specimen: STOOL  Result Value Ref Range Status   C Diff antigen NEGATIVE NEGATIVE Final   C Diff toxin NEGATIVE NEGATIVE Final   C Diff interpretation No C. difficile detected.  Final    Comment: Performed at [facility], [address] [place], [place] [address]    Radiology Studies: IR Sinus/Fist Tube Chk-Non GI  Result Date: 07/03/2021 INDICATION: Status post percutaneous catheter drainage of left pelvic diverticular abscess on 06/06/2021. Assessment for possible fistula prior to drain removal. EXAM: INJECTION OF ABSCESS DRAINAGE CATHETER UNDER FLUOROSCOPY MEDICATIONS: No medications were administered. ANESTHESIA/SEDATION: No sedation was administered. CONTRAST:  5 mL Omnipaque 300 FLUOROSCOPY TIME:  6 seconds.  4.0 minutes COMPLICATIONS: None immediate. PROCEDURE: The pre-existing drainage catheter was injected under fluoroscopy. There is preferential outflow of contrast via the tract and onto the skin. No residual abscess cavity is identified. There is no evidence of fistula to bowel. IMPRESSION: No abscess cavity or fistula identified with contrast injection of the percutaneous drainage catheter. Due to low output, the drainage catheter was removed following the injection procedure today. Electronically Signed   By: Irish Lack M.D.   On: 07/03/2021 16:59      Kreston Ahrendt T. Aldrich [facility]  If 7PM-7AM, please contact night-coverage [email or web address] 07/04/2021, 11:58 AM

## 2021-07-04 NOTE — Evaluation (Signed)
Physical Therapy Evaluation Patient Details Name: [name] MRN: [id number] DOB: 1951/03/12 Today's Date: 07/04/2021   History of Present Illness  70 year old female who presented to the hospital (recent d/c from [facility] to SNF for short term rehab) with episode of shakiness, weakness,and diarrhea.. patient was noted to have acute metabolic encephalopathy, acute kidney injury, and possible seizure. EEG completed on 7/18 indicated seizure. PMH: patient was recently at [facility] from [facility]7/8 for perforated diverticulitis with abscessm, anemia, CHF, DM, graves disease, HTN, obesity, stroke, seizures, thyroid disease.  Clinical Impression  On eval, pt required Min Assist +2 for mobility. She was able to stand and take side steps along the side of the bed with use of a RW. Pt presents with general weakness, decreased activity tolerance, and impaired gait and balance. Will plan to follow and progress activity as tolerated. Recommend return to SNF to continue rehab.     Follow Up Recommendations SNF    Equipment Recommendations  None recommended by PT    Recommendations for Other Services       Precautions / Restrictions Precautions Precautions: Fall Precaution Comments: monitor HR, seizure precaution Restrictions Weight Bearing Restrictions: No      Mobility  Bed Mobility Overal bed mobility: Needs Assistance Bed Mobility: Supine to Sit;Sit to Supine     Supine to sit: Mod assist;HOB elevated Sit to supine: Mod assist;HOB elevated   General bed mobility comments: Asssit for trunk and bil LEs. Increased time. No c/o dizziness sitting EOB    Transfers Overall transfer level: Needs assistance Equipment used: Rolling walker (2 wheeled) Transfers: Sit to/from Stand Sit to Stand: Min assist;+2 physical assistance;+2 safety/equipment;From elevated surface         General transfer comment: 2 attempts to get to standing-pt rocks and uses momentum. Assist to rise, steady,  control descent.  Ambulation/Gait Ambulation/Gait assistance: Min assist;+2 safety/equipment   Assistive device: Rolling walker (2 wheeled)       General Gait Details: side steps with RW along bedside. assist to stabilize and maneuver RW. pt tolerated well.  Stairs            Wheelchair Mobility    Modified Rankin (Stroke Patients Only)       Balance Overall balance assessment: Needs assistance         Standing balance support: Bilateral upper extremity supported Standing balance-Leahy Scale: Poor                               Pertinent Vitals/Pain Pain Assessment: No/denies pain    Home Living Family/patient expects to be discharged to:: Skilled nursing facility                      Prior Function Level of Independence: Needs assistance   Gait / Transfers Assistance Needed: using walker most recently at SNF  ADL's / Homemaking Assistance Needed: assist for ADLs        Hand Dominance        Extremity/Trunk Assessment   Upper Extremity Assessment Upper Extremity Assessment: Defer to OT evaluation    Lower Extremity Assessment Lower Extremity Assessment: Generalized weakness    Cervical / Trunk Assessment Cervical / Trunk Exceptions: increased body habitus  Communication   Communication: No difficulties  Cognition Arousal/Alertness: Awake/alert Behavior During Therapy: WFL for tasks assessed/performed Overall Cognitive Status: Within Functional Limits for tasks assessed  General Comments      Exercises     Assessment/Plan    PT Assessment Patient needs continued PT services  PT Problem List Decreased strength;Decreased mobility;Decreased activity tolerance;Decreased balance;Decreased knowledge of use of DME       PT Treatment Interventions DME instruction;Gait training;Therapeutic exercise;Balance training;Functional mobility training;Therapeutic  activities;Patient/family education    PT Goals (Current goals can be found in the Care Plan section)  Acute Rehab PT Goals Patient Stated Goal: to get better PT Goal Formulation: With patient Time For Goal Achievement: 07/18/21 Potential to Achieve Goals: Fair    Frequency Min 3X/week   Barriers to discharge        Co-evaluation               AM-PAC PT "6 Clicks" Mobility  Outcome Measure Help needed turning from your back to your side while in a flat bed without using bedrails?: A Little Help needed moving from lying on your back to sitting on the side of a flat bed without using bedrails?: A Little Help needed moving to and from a bed to a chair (including a wheelchair)?: A Little Help needed standing up from a chair using your arms (e.g., wheelchair or bedside chair)?: A Little Help needed to walk in hospital room?: A Lot Help needed climbing 3-5 steps with a railing? : Total 6 Click Score: 15    End of Session   Activity Tolerance: Patient limited by fatigue Patient left: in bed;with call bell/phone within reach;with family/visitor present   PT Visit Diagnosis: Muscle weakness (generalized) (M62.81);Difficulty in walking, not elsewhere classified (R26.2);Unsteadiness on feet (R26.81);Other abnormalities of gait and mobility (R26.89)    Time: 1340-1404 PT Time Calculation (min) (ACUTE ONLY): 24 min   Charges:   PT Evaluation $PT Eval Moderate Complexity: 1 Mod PT Treatments $Gait Training: 8-22 mins           Faye Ramsay, PT Acute Rehabilitation  Office: [phone] Pager: [phone]

## 2021-07-04 NOTE — Progress Notes (Signed)
Pt called out, wanted the CPAP off.  She said she feels like it is pushing too much air, more air than what she breathes in in one breath. RN removed machine. Pt was on it less than 5 min

## 2021-07-04 NOTE — Progress Notes (Signed)
Patient voided 250 ccs yellow urine, urinalysis sent.

## 2021-07-04 NOTE — Consult Note (Signed)
[facility] Inpatient Consult   07/04/2021  Vernetta Dizdarevic [birth year] [id number]   [facility] [[facility]]  Accountable Care Organization [ACO] Patient: Monia Pouch Medicare   Patient screened for unplanned readmission less than 30 days and high unplanned readmission risk score. Assess for potential  [facility] Care Management [facility] CM) post hospital transition of care needs.  Per review, patient admitted from SNF.   Plan: Continue to follow for progression and disposition plans.  Of note, [facility] CM services does not replace or interfere with any services that are arranged by inpatient case management or social work.   Christophe Louis, MSN, RN [facility] Liaison Nurse Mobile Phone [phone]  Toll free office [phone]

## 2021-07-04 NOTE — Care Management Important Message (Signed)
Important Message  Patient Details IM Letter given to the Patient. Name: [name] MRN: [id number] Date of Birth: 03-Jun-1951   Medicare Important Message Given:  Yes     Caren Macadam 07/04/2021, 9:31 AM

## 2021-07-04 NOTE — Consult Note (Signed)
NEUROLOGY CONSULTATION NOTE   Date of service: July 04, 2021 Patient Name: [name] MRN:  [id number] DOB:  03/21/1951 Reason for consult: "Seizures and AMS" Requesting Provider: Almon Hercules, MD _ _ _   _ __   _ __ _ _  __ __   _ __   __ _  History of Present Illness  [name] is a 70 y.o. female with PMH significant for CHF, OSA, obesity, prior left parietal and occipital intracranial hemorrhage in 2007, hx of seizures in the past in the setting of hypertension(stopped Keppra with no recurrence of seizures per notes), hx of CKD 3A, DM2, hypothyroidism, recent admission in June 2022 for diverticulitis who presented to [facility] ED from her SNF with poor po intake and no appetite, diarrhea and confusion with twitching. Patient reports poor apetite and she just did not feel like eating at her facility for the last several days. She also reports that the drain in er R side was having a lot of fluid. She felt weak and tired and she was eventually sent to the ED.  At [facility] long, she was found to be dehydrated with AKI. She had a routine EEG at [facility] which demonstrated a 50 sec left paieto-occipital seizure with no clinical signs. She was loaded with Keppra and started on Keppra maintenance and she was transferred to [facility] for further evaluation and for a cEEG.  She reports prior history of just 1 seizure around 2011 and has not had any seizures since. She stopped taking Keppra and reports that she did so as she was not having any more seizures.  She reports that she has been eating a bit more everyday and feels like she is getting better. She also reports that she was in the hospital for about a month back in June and she was mostly in her bed after discharge and has been very weak since.   ROS   Constitutional Denies weight loss, fever and chills.   HEENT Denies changes in vision and hearing.   Respiratory Denies SOB and cough.   CV Denies palpitations and CP    GI Denies abdominal pain, nausea, vomiting and diarrhea.   GU Denies dysuria and urinary frequency.   MSK Denies myalgia and joint pain.   Skin Denies rash and pruritus.   Neurological Denies headache and syncope.   Psychiatric Denies recent changes in mood. Denies anxiety and depression.    Past History   Past Medical History:  Diagnosis Date  . Anemia   . Celiac disease   . CHF (congestive heart failure) (HCC)   . Depression   . Diabetes mellitus without complication (HCC)   . Edema    lower extremities  . Emphysema of lung (HCC)   . Enlarged heart    begining stage  . Graves disease   . Headache(784.0) 05/18/2013  . Heart murmur   . Hematochezia   . Hemorrhoid   . Hyperlipidemia   . Hypertension   . Migraine   . Obese   . OSA (obstructive sleep apnea)   . Seizures (HCC)   . Sleep apnea   . Stroke [facility])    left P-O ICH 2007  . Thyroid disease    hypothyroidism  . Ulcer   . Vitamin D deficiency    Past Surgical History:  Procedure Laterality Date  . CESAREAN SECTION    . ESOPHAGOGASTRODUODENOSCOPY N/A 06/14/2021   Procedure: ESOPHAGOGASTRODUODENOSCOPY (EGD);  Surgeon: Jeani Hawking, MD;  Location: [facility] ENDOSCOPY;  Service: Endoscopy;  Laterality: N/A;  . ESOPHAGOGASTRODUODENOSCOPY (EGD) WITH PROPOFOL N/A 06/01/2021   Procedure: ESOPHAGOGASTRODUODENOSCOPY (EGD) WITH PROPOFOL;  Surgeon: Jeani Hawking, MD;  Location: [facility] ENDOSCOPY;  Service: Endoscopy;  Laterality: N/A;  . HEMORRHOID SURGERY    . IR IVC FILTER PLMT / S&I /IMG GUID/MOD SED  06/01/2021  . IR SINUS/FIST TUBE CHK-NON GI  06/14/2021  . IR SINUS/FIST TUBE CHK-NON GI  07/03/2021  . IR US GUIDE VASC ACCESS RIGHT  06/01/2021  . IUD REMOVAL    . TONSILLECTOMY    . VEIN SURGERY     Family History  Problem Relation Age of Onset  . Heart disease Father   . Diabetes Father   . Heart failure Father   . Diabetes Mother   . Thyroid disease Sister   . Diabetes Brother    Social History   Socioeconomic History   . Marital status: Widowed    Spouse name: Not on file  . Number of children: Not on file  . Years of education: Not on file  . Highest education level: Not on file  Occupational History  . Not on file  Tobacco Use  . Smoking status: Never  . Smokeless tobacco: Never  Vaping Use  . Vaping Use: Never used  Substance and Sexual Activity  . Alcohol use: No  . Drug use: No  . Sexual activity: Never  Other Topics Concern  . Not on file  Social History Narrative  . Not on file   Social Determinants of Health   Financial Resource Strain: Not on file  Food Insecurity: Not on file  Transportation Needs: Not on file  Physical Activity: Not on file  Stress: Not on file  Social Connections: Not on file   Allergies  Allergen Reactions  . Gluten Meal     Celiac    Medications   Medications Prior to Admission  Medication Sig Dispense Refill Last Dose  . acetaminophen (TYLENOL) 325 MG tablet Take 650 mg by mouth every 4 (four) hours as needed for fever.   unk  . albuterol (VENTOLIN HFA) 108 (90 Base) MCG/ACT inhaler Inhale 2 puffs into the lungs every 6 (six) hours as needed for wheezing or shortness of breath.   unk  . Fluticasone-Salmeterol (ADVAIR) 250-50 MCG/DOSE AEPB Inhale 1 puff into the lungs daily as needed for allergies.   unk  . furosemide (LASIX) 20 MG tablet Take 1 tablet (20 mg total) by mouth daily. 7 tablet 0 unk  . levofloxacin (LEVAQUIN) 750 MG tablet Take 1 tablet (750 mg total) by mouth daily. (Patient taking differently: Take 750 mg by mouth daily. X 10 day starting 07/09 - 07/19)   unk  . levothyroxine (SYNTHROID) 112 MCG tablet Take 1 tablet by mouth Monday through Saturday and 1/2 tablet on Sunday. (Patient taking differently: Take 112 mcg by mouth See admin instructions. Take 1 tablet by mouth Monday through Saturday and 1/2 tablet on Sunday.) 30 tablet 3 unk  . meclizine (ANTIVERT) 25 MG tablet Take 1 tablet by mouth daily as needed for dizziness.   unk  .  metFORMIN (GLUCOPHAGE) 500 MG tablet Take 500 mg by mouth 2 (two) times daily.   unk  . mirtazapine (REMERON) 7.5 MG tablet Take 1 tablet (7.5 mg total) by mouth at bedtime.   unk  . ondansetron (ZOFRAN) 4 MG tablet Take 4 mg by mouth every 8 (eight) hours as needed for nausea or vomiting.   unk  .  OVER THE COUNTER MEDICATION Take 1 tablet by mouth daily. Medication: Vitamin b12 3000 mcg   unk  . pantoprazole (PROTONIX) 40 MG tablet Take 40 mg by mouth 2 (two) times daily.   unk  . rosuvastatin (CRESTOR) 5 MG tablet Take 1 tablet (5 mg total) by mouth daily. 90 tablet 3 unk  . saccharomyces boulardii (FLORASTOR) 250 MG capsule Take 1 capsule (250 mg total) by mouth 2 (two) times daily.   unk  . feeding supplement (ENSURE ENLIVE / ENSURE PLUS) LIQD Take 237 mLs by mouth 2 (two) times daily between meals. (Patient not taking: Reported on 07/01/2021) 237 mL 12 Not Taking  . glucose blood (ONETOUCH VERIO) test strip Use as intstructed to check blood sugar once a day Dx Code E11.9 (Patient not taking: Reported on 07/01/2021) 100 strip 12 Not Taking  . Lancets (ONETOUCH ULTRASOFT) lancets Use as instructed (Patient not taking: Reported on 07/01/2021) 100 each 12 Not Taking  . pantoprazole (PROTONIX) 40 MG tablet Take 1 tablet (40 mg total) by mouth 2 (two) times daily before a meal. (Patient not taking: Reported on 07/01/2021)   Not Taking     Vitals   Vitals:   07/04/21 0624 07/04/21 0822 07/04/21 1520 07/04/21 1909  BP: (!) 106/57  121/81 107/78  Pulse: 88  91 84  Resp: 16  20 20   Temp: 98.7 F (37.1 C)  97.9 F (36.6 C) 98.1 F (36.7 C)  TempSrc: Oral  Oral Oral  SpO2: 98% 93% 98% 100%  Height:         Body mass index is 42.82 kg/m.  Physical Exam   General: Laying comfortably in bed; in no acute distress. HENT: Normal oropharynx and mucosa. Normal external appearance of ears and nose.  Neck: Supple, no pain or tenderness  CV: No JVD. No peripheral edema.  Pulmonary: Symmetric Chest  rise. Normal respiratory effort.  Abdomen: Soft to touch, non-tender.  Ext: No cyanosis, edema, or deformity  Skin: No rash. Normal palpation of skin.   Musculoskeletal: Normal digits and nails by inspection. No clubbing.   Neurologic Examination  Mental status/Cognition: Alert, oriented to self, place, month and year, good attention.  Speech/language: Fluent, comprehension intact, object naming intact, repetition intact.  Cranial nerves:   CN II Pupils equal and reactive to light, no VF deficits    CN III,IV,VI EOM intact, no gaze preference or deviation, no nystagmus    CN V normal sensation in V1, V2, and V3 segments bilaterally    CN VII no asymmetry, no nasolabial fold flattening    CN VIII normal hearing to speech    CN IX & X normal palatal elevation, no uvular deviation    CN XI 5/5 head turn and 5/5 shoulder shrug bilaterally    CN XII midline tongue protrusion    Motor:  Muscle bulk: poor, tone normal. Mvmt Root Nerve  Muscle Right Left Comments  SA C5/6 Ax Deltoid 3 3   EF C5/6 Mc Biceps 4+ 4+   EE C6/7/8 Rad Triceps 4+ 4+   WF C6/7 Med FCR     WE C7/8 PIN ECU     F Ab C8/T1 U ADM/FDI 4+ 4+   HF L1/2/3 Fem Illopsoas 3 3   KE L2/3/4 Fem Quad 4+ 4+   DF L4/5 D Peron Tib Ant 5 5   PF S1/2 Tibial Grc/Sol 5 5    Reflexes:  Right Left Comments  Pectoralis      Biceps (C5/6) 1  1   Brachioradialis (C5/6) 1 1    Triceps (C6/7) 1 1    Patellar (L3/4) 1 1    Achilles (S1)      Hoffman      Plantar     Jaw jerk    Sensation:  Light touch intact   Pin prick    Temperature    Vibration   Proprioception    Coordination/Complex Motor:  - Finger to Nose intact BL - Heel to shin unable to do due to weakness. - Rapid alternating movement are normal - Gait: unsafe to assess given her weakness.  Labs   CBC:  Recent Labs  Lab 07/02/21 0615 07/04/21 1037  WBC 9.4 9.6  NEUTROABS 6.6 6.8  HGB 8.2* 9.2*  HCT 26.1* 29.7*  MCV 100.4* 100.7*  PLT 183 204     Basic Metabolic Panel:  Lab Results  Component Value Date   NA 137 07/04/2021   K 4.0 07/04/2021   CO2 23 07/04/2021   GLUCOSE 115 (H) 07/04/2021   BUN 10 07/04/2021   CREATININE 1.87 (H) 07/04/2021   CALCIUM 7.8 (L) 07/04/2021   GFRNONAA 29 (L) 07/04/2021   GFRAA 61 11/07/2020   Lipid Panel:  Lab Results  Component Value Date   LDLCALC 132 (H) 11/07/2020   HgbA1c:  Lab Results  Component Value Date   HGBA1C 5.5 07/02/2021   Urine Drug Screen: No results found for: LABOPIA, COCAINSCRNUR, LABBENZ, AMPHETMU, THCU, LABBARB  Alcohol Level No results found for: [facility]  CT Head without contrast: No acute intracranial abnormality. 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  MRI Brain: pending  rEEG:  This study showed one seizure without clinical signs on 07/03/2021 at 0953 arising from left parieto-occipital region, lasting 50 seconds.  cEEG: Pending.  Impression   [name] is a 70 y.o. female with PMH significant for CHF, OSA, obesity, prior left parietal and occipital intracranial hemorrhage in 2007, hx of seizures in the past in the setting of hypertension(stopped Keppra with no recurrence of seizures per notes), hx of CKD 3A, DM2, hypothyroidism, recent admission in June 2022 for diverticulitis who presented to [facility] ED from her SNF with poor po intake and no appetite, diarrhea and confusion with twitching. Found to have AKI, dehydration and a subclinical left parieto-occipital seizure on routine EEG. She was started on Keppra and transferred here for cEEG. Her neurologic examination is notable for proximal muscle weakness in all extremities consistent with deconditioning.  As for her seizure, she had one seizure more than a decade ago and stopped Keppra due to no further seizures. She does have seizure risk factors including prior L parieto-occipital hemorrhagic stroke which was likely the focus for her  seizure.  Will get a cEEG and continue Keppra 500mg  BID for now.  Recommendations  - cEEG in AM - continue Keppra 500mg  BID - seizure precautions - Ativan 1mg  for seizure lasting more than 3 mins.  ______________________________________________________________   Thank you for the opportunity to take part in the care of this patient. If you have any further questions, please contact the neurology consultation attending.  Signed,  Erick BlinksSalman Ella Golomb [facility] Pager Number [phone] _ _ _   _ __   _ __ _ _  __ __   _ __   __ _

## 2021-07-04 NOTE — Plan of Care (Signed)

## 2021-07-05 ENCOUNTER — Inpatient Hospital Stay (HOSPITAL_COMMUNITY): Payer: Medicare HMO

## 2021-07-05 DIAGNOSIS — R569 Unspecified convulsions: Secondary | ICD-10-CM | POA: Diagnosis not present

## 2021-07-05 DIAGNOSIS — N179 Acute kidney failure, unspecified: Secondary | ICD-10-CM | POA: Diagnosis not present

## 2021-07-05 LAB — CBC
HCT: 25.4 % — ABNORMAL LOW (ref 36.0–46.0)
Hemoglobin: 8 g/dL — ABNORMAL LOW (ref 12.0–15.0)
MCH: 31.3 pg (ref 26.0–34.0)
MCHC: 31.5 g/dL (ref 30.0–36.0)
MCV: 99.2 fL (ref 80.0–100.0)
Platelets: 186 10*3/uL (ref 150–400)
RBC: 2.56 MIL/uL — ABNORMAL LOW (ref 3.87–5.11)
RDW: 17.7 % — ABNORMAL HIGH (ref 11.5–15.5)
WBC: 8.1 10*3/uL (ref 4.0–10.5)
nRBC: 0 % (ref 0.0–0.2)

## 2021-07-05 LAB — RENAL FUNCTION PANEL
Albumin: 2 g/dL — ABNORMAL LOW (ref 3.5–5.0)
Anion gap: 6 (ref 5–15)
BUN: 7 mg/dL — ABNORMAL LOW (ref 8–23)
CO2: 21 mmol/L — ABNORMAL LOW (ref 22–32)
Calcium: 7.9 mg/dL — ABNORMAL LOW (ref 8.9–10.3)
Chloride: 108 mmol/L (ref 98–111)
Creatinine, Ser: 1.74 mg/dL — ABNORMAL HIGH (ref 0.44–1.00)
GFR, Estimated: 31 mL/min — ABNORMAL LOW (ref >60)
Glucose, Bld: 97 mg/dL (ref 70–99)
Phosphorus: 2.4 mg/dL — ABNORMAL LOW (ref 2.5–4.6)
Potassium: 4.2 mmol/L (ref 3.5–5.1)
Sodium: 135 mmol/L (ref 135–145)

## 2021-07-05 LAB — CK: Total CK: 38 U/L (ref 38–234)

## 2021-07-05 LAB — MAGNESIUM: Magnesium: 1.6 mg/dL — ABNORMAL LOW (ref 1.7–2.4)

## 2021-07-05 MED ORDER — MAGNESIUM SULFATE 2 GM/50ML IV SOLN
2.0000 g | Freq: Once | INTRAVENOUS | Status: AC
  Administered 2021-07-05: 2 g via INTRAVENOUS
  Filled 2021-07-05: qty 50

## 2021-07-05 MED ORDER — SODIUM CHLORIDE 0.9 % IV SOLN: INTRAVENOUS | Status: DC

## 2021-07-05 NOTE — Assessment & Plan Note (Addendum)
--   Improved.  C. difficile negative.  Continue Imodium.  Present prior to admission, may be related to recent bout with diverticulitis, also consider metformin. She also reports history of celiac disease, concern for dietary noncompliance at facility

## 2021-07-05 NOTE — Assessment & Plan Note (Addendum)
--   Probably multifactorial including acute kidney injury, seizure. -- Resolved.

## 2021-07-05 NOTE — Plan of Care (Signed)

## 2021-07-05 NOTE — NC FL2 (Signed)
[place] MEDICAID FL2 LEVEL OF CARE SCREENING TOOL     IDENTIFICATION  Patient Name: N[facility]isa [facility] Birthdate: Aug 16, 1951 Sex: female Admission Date (Current Location): 07/01/2021  [facility] and [place] Number:      Facility and Address:         Provider Number:    Attending Physician Name and Address:  Standley Brooking, MD  Relative Name and Phone Number:       Current Level of Care:   Recommended Level of Care:   Prior Approval Number:    Date Approved/Denied:   PASRR Number:    Discharge Plan:      Current Diagnoses: Patient Active Problem List   Diagnosis Date Noted   Lactic acidosis 07/02/2021   Essential hypertension 07/02/2021   Hypothyroidism 07/02/2021   Acute metabolic encephalopathy 07/02/2021   Diarrhea 07/02/2021   Pressure injury of skin 07/02/2021   Acute renal failure superimposed on stage 3a chronic kidney disease (HCC) 07/01/2021   Colonic diverticular abscess 05/26/2021   Bilateral pulmonary embolism (HCC) 05/26/2021   Cardiac arrhythmia 05/23/2021   Chronic kidney disease due to hypertension 05/23/2021   Chronic kidney disease, stage 3a (HCC) 05/23/2021   Diabetic renal disease (HCC) 05/23/2021   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   Gastroesophageal reflux disease 05/23/2021   Hyperlipidemia 05/23/2021   Moderate persistent asthma, uncomplicated 05/23/2021   Stroke (HCC) 05/23/2021   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 05/23/2021   Morbid obesity (HCC) 11/07/2020   Optic neuropathy, compressive 08/24/2020   Thyroid eye disease 08/04/2020   Type 2 diabetes mellitus with stage 3a chronic kidney disease, without long-term current use of insulin (HCC) 04/09/2020   Hypertensive retinopathy of both eyes 04/09/2020   Anemia 08/07/2017   Celiac disease 08/07/2017   Graves' disease with exophthalmos 08/07/2017   History of depression 08/07/2017   Headache(784.0) 05/18/2013   Chronic diastolic CHF  (congestive heart failure) (HCC) 12/04/2011   Hemorrhoids, internal, with bleeding 11/28/2011   Seizure (HCC) 06/30/2010    Orientation RESPIRATION BLADDER Height & Weight     Self, Time, Situation, Place  Normal Incontinent Weight:   Height:  5\' 2"  (157.5 cm)  BEHAVIORAL SYMPTOMS/MOOD NEUROLOGICAL BOWEL NUTRITION STATUS      Incontinent Diet (Regular with thin liquids)  AMBULATORY STATUS COMMUNICATION OF NEEDS Skin   Limited Assist Verbally Bruising, Skin abrasions (excoriation to buttocks/ bruising to arms and legs/ stage to to lt buttock/ foam dressings to rt thigh and abd)                       Personal Care Assistance Level of Assistance  Bathing, Feeding, Dressing Bathing Assistance: Limited assistance Feeding assistance: Independent Dressing Assistance: Limited assistance     Functional Limitations Info  Sight, Speech, Hearing Sight Info: Adequate Hearing Info: Adequate Speech Info: Adequate    SPECIAL CARE FACTORS FREQUENCY  PT (By licensed PT), OT (By licensed OT)     PT Frequency: 5x/wk OT Frequency: 5x/wk            Contractures Contractures Info: Not present    Additional Factors Info  Code Status, Allergies Code Status Info: Full Allergies Info: Gluten           Current Medications (07/05/2021):  This is the current hospital active medication list Current Facility-Administered Medications  Medication Dose Route Frequency Provider Last Rate Last Admin   acetaminophen (TYLENOL) tablet 650 mg  650 mg Oral Q6H PRN 07/07/2021, MD  Or   acetaminophen (TYLENOL) suppository 650 mg  650 mg Rectal Q6H PRN Gonfa, Taye T, MD       albuterol (PROVENTIL) (2.5 MG/3ML) 0.083% nebulizer solution 3 mL  3 mL Inhalation Q6H PRN Gonfa, Taye T, MD       ascorbic acid (VITAMIN C) tablet 250 mg  250 mg Oral BID Candelaria Stagers T, MD   250 mg at 07/05/21 0924   dextrose 5% in lactated ringers with KCl 20 mEq/L infusion   Intravenous Continuous Almon Hercules, MD  75 mL/hr at 07/04/21 0958 New Bag at 07/04/21 0958   feeding supplement (BOOST / RESOURCE BREEZE) liquid 1 Container  1 Container Oral TID BM Almon Hercules, MD   1 Container at 07/05/21 0924   fluticasone furoate-vilanterol (BREO ELLIPTA) 200-25 MCG/INH 1 puff  1 puff Inhalation Daily Candelaria Stagers T, MD   1 puff at 07/04/21 0822   levETIRAcetam (KEPPRA) IVPB 500 mg/100 mL premix  500 mg Intravenous Q12H Candelaria Stagers T, MD 400 mL/hr at 07/05/21 0209 500 mg at 07/05/21 0209   levothyroxine (SYNTHROID) tablet 112 mcg  112 mcg Oral Q0600 Candelaria Stagers T, MD   112 mcg at 07/05/21 7902   loperamide (IMODIUM) capsule 2 mg  2 mg Oral PRN Almon Hercules, MD       multivitamin with minerals tablet 1 tablet  1 tablet Oral Daily Candelaria Stagers T, MD   1 tablet at 07/05/21 0924   ondansetron (ZOFRAN) tablet 4 mg  4 mg Oral Q6H PRN Almon Hercules, MD       Or   ondansetron (ZOFRAN) injection 4 mg  4 mg Intravenous Q6H PRN Candelaria Stagers T, MD   4 mg at 07/04/21 1023   pantoprazole (PROTONIX) injection 40 mg  40 mg Intravenous Q12H Almon Hercules, MD   40 mg at 07/05/21 4097     Discharge Medications: Please see discharge summary for a list of discharge medications.  Relevant Imaging Results:  Relevant Lab Results:   Additional Information SS# [id number]  Kermit Balo, RN

## 2021-07-05 NOTE — Progress Notes (Addendum)
Neurology Progress Note  Brief HPI: 70 y.o. female with PMHx of CHF, OSA, obesity, remote left parieto-occipital ICH in 2007, history of seizures in the setting of HTN off of Keppra without known seizure recurrence, CKD 3A, DM2, hypothyroidism, and recent prolonged hospitalization in June 2022 for diverticulitis who presented to [facility] 7/16 from her SNF for evaluation of decreased appetite, poor PO intake, diarrhea, lethargy, weakness, and confusion with twitching. Initial work up revealed AKI with dehydration. Routine EEG revealed a 50 second left parieto-occipital seizure without clinical signs. She was loaded with Keppra and transferred to [facility] for neurology evaluation and cEEG. Regarding her seizure history, she claims she had one seizure in 2011 and has since stopped taking Keppra since she has not had any seizures since. She also endorses weakness since her month long hospitalization in June of 2022.   Subjective: No acute overnight events Patient does endorse intermittent diplopia on assessment that clears with eye blinking   Exam: Vitals:   07/05/21 0403 07/05/21 0737  BP: 103/68 107/71  Pulse: 64 84  Resp: 16 18  Temp: 98 F (36.7 C) 98.4 F (36.9 C)  SpO2: 99% 97%   Gen: Laying comfortably in bed, sleeping initially, in no acute distress Resp: non-labored breathing, no respiratory distress Abd: soft, non-tender, non-distended  Neuro: Mental Status: Asleep, wakes easily to voice. She is alert to person, place, time, and situation.  She is able to provide a clear and coherent history of present illness. Good attention. Speech is intact without dysarthria. Naming, repetition, comprehension, and fluency are intact.  No signs of aphasia or neglect noted.  Cranial Nerves: PERRL, EOMI without ptosis, visual fields are full, facial sensation intact and symmetric to light touch, face is symmetric resting and smiling, hearing is intact to voice, palate elevates symmetrically,  shoulders shrug symmetrically, phonation normal, tongue protrudes midline Motor: Generalized weakness notes Bilateral upper extremities 3/5 deltoid, 4/5 biceps, triceps, and grip strength Bilateral lower extremities 4/5 strength throughout  Tone is normal, bulk is decreased.  Sensory: Sensation to light touch intact and symmetric throughout each extremity x 4 Gait: Deferred  Pertinent Labs: CBC    Component Value Date/Time   WBC 9.6 07/04/2021 1037   RBC 2.95 (L) 07/04/2021 1037   HGB 9.2 (L) 07/04/2021 1037   HCT 29.7 (L) 07/04/2021 1037   PLT 204 07/04/2021 1037   MCV 100.7 (H) 07/04/2021 1037   MCH 31.2 07/04/2021 1037   MCHC 31.0 07/04/2021 1037   RDW 18.2 (H) 07/04/2021 1037   LYMPHSABS 1.5 07/04/2021 1037   MONOABS 1.0 07/04/2021 1037   EOSABS 0.2 07/04/2021 1037   BASOSABS 0.1 07/04/2021 1037   CMP     Component Value Date/Time   NA 137 07/04/2021 1037   NA 141 11/07/2020 1058   K 4.0 07/04/2021 1037   CL 107 07/04/2021 1037   CO2 23 07/04/2021 1037   GLUCOSE 115 (H) 07/04/2021 1037   BUN 10 07/04/2021 1037   BUN 13 11/07/2020 1058   CREATININE 1.87 (H) 07/04/2021 1037   CALCIUM 7.8 (L) 07/04/2021 1037   PROT 5.1 (L) 07/04/2021 1037   PROT 6.5 11/07/2020 1058   ALBUMIN 2.5 (L) 07/04/2021 1037   ALBUMIN 4.3 11/07/2020 1058   AST 45 (H) 07/04/2021 1037   ALT 21 07/04/2021 1037   ALKPHOS 63 07/04/2021 1037   BILITOT 0.7 07/04/2021 1037   BILITOT 0.6 11/07/2020 1058   GFRNONAA 29 (L) 07/04/2021 1037   GFRAA 61 11/07/2020 1058  Urinalysis    Component Value Date/Time   COLORURINE YELLOW 07/04/2021 1800   APPEARANCEUR CLEAR 07/04/2021 1800   LABSPEC 1.012 07/04/2021 1800   PHURINE 5.0 07/04/2021 1800   GLUCOSEU NEGATIVE 07/04/2021 1800   GLUCOSEU NEGATIVE 07/13/2020 0900   HGBUR NEGATIVE 07/04/2021 1800   BILIRUBINUR NEGATIVE 07/04/2021 1800   KETONESUR NEGATIVE 07/04/2021 1800   PROTEINUR NEGATIVE 07/04/2021 1800   UROBILINOGEN 0.2 07/13/2020 0900    NITRITE NEGATIVE 07/04/2021 1800   LEUKOCYTESUR TRACE (A) 07/04/2021 1800   Imaging Reviewed:  CT Head without contrast 07/02/2021: No acute intracranial abnormality. 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  rEEG 07/03/2021:  This study showed one seizure without clinical signs on 07/03/2021 at 0953 arising from left parieto-occipital region, lasting 50 seconds.  cEEG: Pending.   Assessment: 70 y.o. female who presented to [facility] from SNF with poor po intake and no appetite, diarrhea and confusion with twitching. Found to have AKI, dehydration and a subclinical left parieto-occipital seizure on routine EEG. She was started on Keppra and transferred to [facility] for cEEG.  - Examination reveals proximal muscle weakness in all extremities consistent with deconditioning. - As for her seizure, she had one seizure more than a decade ago and stopped Keppra due to no further seizures. She does have seizure risk factors including prior L parieto-occipital hemorrhagic stroke which was likely the focus for her seizure.  - rEEG with evidence os subclinical seizure arising from left parieto-occipital region, lasting 50 seconds. LTM EEG pending. Patient loaded with Keppra and started on maintenance Keppra 500 mg BID. Will defer MRI brain for now with known seizure focus; remote left parieto-occipital ICH with left parieto-occipital seizure identified on routine EEG.    Impression:  Remote left parieto-occipital ICH in 2007 History of seizures not on Keppra Subclinical seizure identified on rEEG 07/03/2021- left parieto-occipital region   Recommendations: - cEEG ordered, pending - Continue Keppra 500 mg BID - Inpatient seizure precautions - Ativan 1mg  IV for any seizure lasting > 5 minutes and notify neurology immediately   , AGACNP-BC [facility] [phone]  Neurology Attending Attestation  I was available on site and  discussed plan with Ms. Toberman. I have edited the above note for clarity and to reflect the current plan.  [phone], MD [facility] [phone]  If 7pm- 7am, please page neurology on call as listed in AMION.

## 2021-07-05 NOTE — Plan of Care (Signed)

## 2021-07-05 NOTE — Assessment & Plan Note (Addendum)
--   CBG stable, no need to monitor, no need for sliding scale insulin. -- Stop metformin on discharge.

## 2021-07-05 NOTE — TOC Progression Note (Signed)
Transition of Care [facility]) - Progression Note    Patient Details  Name: [name] MRN: [id number] Date of Birth: 1951/01/22  Transition of Care [facility]) CM/SW Contact  Kermit Balo, RN Phone Number: 07/05/2021, 12:56 PM  Clinical Narrative:    CM went to meet with the patient but she was sleeping and having EEG done. CM reached out to her daughter, [name]. [name] is in agreement with having her mother return to [facility] when she is medically ready for d/c. [facility] is also accepting her back. She will need a new auth prior to returning which the facility will obtain. FL2 completed and sent to the facility.  Whitney asked to speak to MD and CM sent him a message.  TOC following.   Expected Discharge Plan: Skilled Nursing Facility Barriers to Discharge: No Barriers Identified  Expected Discharge Plan and Services Expected Discharge Plan: Skilled Nursing Facility   Discharge Planning Services: CM Consult   Living arrangements for the past 2 months: Skilled Nursing Facility                                       Social Determinants of Health (SDOH) Interventions    Readmission Risk Interventions No flowsheet data found.

## 2021-07-05 NOTE — Progress Notes (Signed)
cEEG study started.  [facility] monitoring notified.

## 2021-07-05 NOTE — Assessment & Plan Note (Addendum)
--   In context of poor oral intake and diarrhea, outpatient Lasix.  CT abdomen pelvis and renal ultrasound without significant findings. -- Essentially resolved.  Can resume Lasix on discharge.

## 2021-07-05 NOTE — Progress Notes (Signed)
PROGRESS NOTE  [name] NFA:[id number] DOB: [birth year] DOA: 07/01/2021 PCP: Ollen Bowl, MD  Brief History   70 year old woman presented with lethargy, generalized weakness, diarrhea, poor oral intake, shaking episode admitted for AKI, acute metabolic encephalopathy, possible seizure.  EEG showed subclinical seizure, transferred to [facility] for long-term video monitoring and neurology evaluation.  A & P  * AKI (acute kidney injury) (HCC) -- In context of poor oral intake and diarrhea, outpatient Lasix.  CT abdomen pelvis and renal ultrasound without significant findings. -- Improved with IV fluids.  Lasix on hold.  Continue IV fluids.  BMP in AM.  Seizure (HCC) -- Seen on EEG.  Continue neurology per Keppra.  Continue video EEG as per neurology.  Type 2 diabetes mellitus with stage 3a chronic kidney disease, without long-term current use of insulin (HCC) -- CBG stable, no need to monitor, no need for sliding scale insulin.  Acute metabolic encephalopathy -- Probably multifactorial including acute kidney injury, seizure. -- Appears resolved at this point.  Supportive care.  Chronic diastolic CHF (congestive heart failure) (HCC) -- LVEF 65-70%, indeterminant diastolic function.  Continue to hold Lasix.  Monitor volume status.  Diarrhea -- Improved per patient.  C. difficile negative.  Continue Imodium.  Present prior to admission, may be related to recent bout with diverticulitis.  Skin Assessment: I have examined the patient's skin and I agree with the wound assessment as performed by the wound care RN as outlined below: Pressure Injury 07/02/21 Buttocks Left Stage 2 -  Partial thickness loss of dermis presenting as a shallow open injury with a red, pink wound bed without slough. (Active)  07/02/21 0923  Location: Buttocks  Location Orientation: Left  Staging: Stage 2 -  Partial thickness loss of dermis presenting as a shallow open injury with a red, pink wound bed  without slough.  Wound Description (Comments):   Present on Admission: Yes   Disposition Plan:  Discussion: Improving, continue IV fluids, continue EEG monitoring, repeat BMP in a.m., follow-up neurology recommendations.  Status is: Inpatient  Remains inpatient appropriate because:IV treatments appropriate due to intensity of illness or inability to take PO and Inpatient level of care appropriate due to severity of illness  Dispo: The patient is from: SNF              Anticipated d/c is to: SNF              Patient currently is not medically stable to d/c.   Difficult to place patient No  DVT prophylaxis: SCDs Start: 07/02/21 0029   Code Status: Full Code Level of care: Telemetry Medical Family Communication: daughter Alphonzo Lemmings by telephone  Brendia Sacks, MD  [facility] Direct contact: see [email or web address] (further directions at bottom of note if needed) 7PM-7AM contact night coverage as at bottom of note 07/05/2021, 6:25 PM  LOS: 4 days    Consults:  Neurology    Procedures:    Significant Diagnostic Tests:     Micro Data:     Antimicrobials:    Interval History/Subjective  CC: f/u seizure  Feels ok No SOB No n/v Diarrhea slowing down Eating ok  Objective   Vitals:  Vitals:   07/05/21 1224 07/05/21 1619  BP: (!) 105/58 103/63  Pulse: 76 94  Resp: 20 18  Temp: 98.4 F (36.9 C) 98.3 F (36.8 C)  SpO2: 95% (!) 84%    Exam: Physical Exam Vitals and nursing note reviewed.  Constitutional:      General:  She is not in acute distress.    Appearance: She is not ill-appearing or toxic-appearing.  Cardiovascular:     Rate and Rhythm: Normal rate and regular rhythm.     Comments: Telemetry SR Pulmonary:     Effort: Pulmonary effort is normal. No respiratory distress.     Breath sounds: Normal breath sounds. No wheezing, rhonchi or rales.  Musculoskeletal:     Right lower leg: Edema present.     Left lower leg: Edema present.  Neurological:      Mental Status: She is oriented to person, place, and time.  Psychiatric:        Mood and Affect: Mood normal.        Behavior: Behavior normal.    I have personally reviewed the labs and other data, making special note of:   Today's Data  Creatinine down to 1.74, magnesium 1.6   Scheduled Meds:  vitamin C  250 mg Oral BID   feeding supplement  1 Container Oral TID BM   fluticasone furoate-vilanterol  1 puff Inhalation Daily   levothyroxine  112 mcg Oral Q0600   multivitamin with minerals  1 tablet Oral Daily   pantoprazole (PROTONIX) IV  40 mg Intravenous Q12H   Continuous Infusions:  dextrose 5% lactated ringers with KCl 20 mEq/L Stopped (07/05/21 1600)   levETIRAcetam 500 mg (07/05/21 1352)   magnesium sulfate bolus IVPB      Principal Problem:   AKI (acute kidney injury) (HCC) Active Problems:   Seizure (HCC)   Type 2 diabetes mellitus with stage 3a chronic kidney disease, without long-term current use of insulin (HCC)   Chronic diastolic CHF (congestive heart failure) (HCC)   Acute metabolic encephalopathy   Moderate persistent asthma, uncomplicated   Bilateral pulmonary embolism (HCC)   Lactic acidosis   Essential hypertension   Hypothyroidism   Diarrhea   Pressure injury of skin   LOS: 4 days   How to contact the [facility] Attending or Consulting provider 7A - 7P or covering provider during after hours 7P -7A, for this patient?  Check the care team in [facility] and look for a) attending/consulting TRH provider listed and b) the [facility] team listed Log into [email or web address] and use [place]'s universal password to access. If you do not have the password, please contact the hospital operator. Locate the [facility] provider you are looking for under [facility] and page to a number that you can be directly reached. If you still have difficulty reaching the provider, please page the [facility] (Director on Call) for the Hospitalists listed on amion for assistance.

## 2021-07-05 NOTE — Assessment & Plan Note (Addendum)
--   seen on spot EEG.  Started on Keppra.  Long-term EEG unrevealing.  Continue Keppra on discharge, follow-up with neurology as an outpatient.  Seizure precautions, no driving.

## 2021-07-05 NOTE — Hospital Course (Addendum)
70 year old woman presented with lethargy, generalized weakness, diarrhea, poor oral intake, shaking episode admitted for AKI, acute metabolic encephalopathy, possible seizure.  EEG showed subclinical seizure, transferred to [facility] for long-term video monitoring and neurology evaluation.  Continuous EEG was unrevealing.  Neurology recommended continuing Keppra, no driving, follow-up with neurology as an outpatient.  Acute kidney injury improving, expect spontaneous resolution at this point.  Medically stable for transfer to skilled nursing facility.  Authorization in process.

## 2021-07-05 NOTE — Assessment & Plan Note (Addendum)
--   LVEF 65-70%, indeterminant diastolic function.  Mild pedal edema bilaterally.  Can restart Lasix on discharge.

## 2021-07-06 DIAGNOSIS — E1122 Type 2 diabetes mellitus with diabetic chronic kidney disease: Secondary | ICD-10-CM | POA: Diagnosis not present

## 2021-07-06 DIAGNOSIS — N1831 Chronic kidney disease, stage 3a: Secondary | ICD-10-CM | POA: Diagnosis not present

## 2021-07-06 DIAGNOSIS — N179 Acute kidney failure, unspecified: Secondary | ICD-10-CM | POA: Diagnosis not present

## 2021-07-06 DIAGNOSIS — G9341 Metabolic encephalopathy: Secondary | ICD-10-CM | POA: Diagnosis not present

## 2021-07-06 LAB — BASIC METABOLIC PANEL
Anion gap: 5 (ref 5–15)
BUN: 8 mg/dL (ref 8–23)
CO2: 22 mmol/L (ref 22–32)
Calcium: 8.1 mg/dL — ABNORMAL LOW (ref 8.9–10.3)
Chloride: 108 mmol/L (ref 98–111)
Creatinine, Ser: 1.55 mg/dL — ABNORMAL HIGH (ref 0.44–1.00)
GFR, Estimated: 36 mL/min — ABNORMAL LOW (ref >60)
Glucose, Bld: 81 mg/dL (ref 70–99)
Potassium: 4.6 mmol/L (ref 3.5–5.1)
Sodium: 135 mmol/L (ref 135–145)

## 2021-07-06 LAB — MAGNESIUM: Magnesium: 1.7 mg/dL (ref 1.7–2.4)

## 2021-07-06 MED ORDER — MAGNESIUM SULFATE 2 GM/50ML IV SOLN
2.0000 g | Freq: Once | INTRAVENOUS | Status: AC
  Administered 2021-07-06: 2 g via INTRAVENOUS
  Filled 2021-07-06: qty 50

## 2021-07-06 MED ORDER — LEVOTHYROXINE SODIUM 112 MCG PO TABS
112.0000 ug | ORAL_TABLET | ORAL | Status: DC
  Administered 2021-07-07 – 2021-07-11 (×4): 112 ug via ORAL
  Filled 2021-07-06 (×5): qty 1

## 2021-07-06 MED ORDER — SODIUM CHLORIDE 0.9 % IV SOLN: INTRAVENOUS | Status: DC

## 2021-07-06 MED ORDER — LEVOTHYROXINE SODIUM 112 MCG PO TABS
56.0000 ug | ORAL_TABLET | ORAL | Status: DC
  Administered 2021-07-09: 56 ug via ORAL
  Filled 2021-07-06: qty 1

## 2021-07-06 MED ORDER — PANTOPRAZOLE SODIUM 40 MG PO TBEC
40.0000 mg | DELAYED_RELEASE_TABLET | Freq: Two times a day (BID) | ORAL | Status: DC
  Administered 2021-07-06 – 2021-07-12 (×12): 40 mg via ORAL
  Filled 2021-07-06 (×12): qty 1

## 2021-07-06 NOTE — Progress Notes (Signed)
PROGRESS NOTE  [name] YHC:[id number] DOB: February 24, 1951 DOA: 07/01/2021 PCP: Ollen Bowl, MD  Brief History   70 year old woman presented with lethargy, generalized weakness, diarrhea, poor oral intake, shaking episode admitted for AKI, acute metabolic encephalopathy, possible seizure.  EEG showed subclinical seizure, transferred to [facility] for long-term video monitoring and neurology evaluation.  EEG was unrevealing.  Further recommendations from neurology pending.  Acute kidney injury improving with IV fluids.  A & P  * AKI (acute kidney injury) (HCC) -- In context of poor oral intake and diarrhea, outpatient Lasix.  CT abdomen pelvis and renal ultrasound without significant findings. -- Improved with IV fluids.  Lasix on hold.  Continue IV fluids.  BMP in AM.  Seizure (HCC) --  seen on spot EEG.   --Long-term EEG unrevealing.  For now continue Keppra, follow-up neurology recommendations.  Asymptomatic.  Type 2 diabetes mellitus with stage 3a chronic kidney disease, without long-term current use of insulin (HCC) -- CBG stable, no need to monitor, no need for sliding scale insulin.  Acute metabolic encephalopathy -- Probably multifactorial including acute kidney injury, seizure. -- Appears resolved at this point.  Supportive care.  Chronic diastolic CHF (congestive heart failure) (HCC) -- LVEF 65-70%, indeterminant diastolic function.  Continue to hold Lasix.  Monitor volume status.  Diarrhea -- Improved per patient.  C. difficile negative.  Continue Imodium.  Present prior to admission, may be related to recent bout with diverticulitis.  Skin Assessment: I have examined the patient's skin and I agree with the wound assessment as performed by the wound care RN as outlined below: Pressure Injury 07/02/21 Buttocks Left Stage 2 -  Partial thickness loss of dermis presenting as a shallow open injury with a red, pink wound bed without slough. (Active)  07/02/21 0923   Location: Buttocks  Location Orientation: Left  Staging: Stage 2 -  Partial thickness loss of dermis presenting as a shallow open injury with a red, pink wound bed without slough.  Wound Description (Comments):   Present on Admission: Yes   Disposition Plan:  Discussion: Improving, continue IV fluids, continue EEG monitoring, repeat BMP in a.m., follow-up neurology recommendations.  Status is: Inpatient  Remains inpatient appropriate because:IV treatments appropriate due to intensity of illness or inability to take PO and Inpatient level of care appropriate due to severity of illness  Dispo: The patient is from: SNF              Anticipated d/c is to: SNF              Patient currently is not medically stable to d/c.   Difficult to place patient No  DVT prophylaxis: SCDs Start: 07/02/21 0029   Code Status: Full Code Level of care: Telemetry Medical Family Communication: daughter Alphonzo Lemmings by telephone  Brendia Sacks, MD  [facility] Direct contact: see [email or web address] (further directions at bottom of note if needed) 7PM-7AM contact night coverage as at bottom of note 07/06/2021, 6:21 PM  LOS: 5 days    Consults:  Neurology    Procedures:    Significant Diagnostic Tests:  EEG   Interval History/Subjective  CC: f/u seizure  No seizures.  Feels fine.  No complaints.  Objective   Vitals:  Vitals:   07/06/21 1513 07/06/21 1521  BP: 94/70 (!) 137/59  Pulse: 80 69  Resp: 16 17  Temp: 97.7 F (36.5 C) 97.6 F (36.4 C)  SpO2: 97% 97%    Exam: Physical Exam Vitals reviewed.  Constitutional:  Appearance: Normal appearance.  Cardiovascular:     Rate and Rhythm: Normal rate and regular rhythm.     Heart sounds: No murmur heard. Pulmonary:     Effort: Pulmonary effort is normal. No respiratory distress.     Breath sounds: Normal breath sounds. No wheezing.  Neurological:     Mental Status: She is alert.  Psychiatric:        Mood and Affect: Mood normal.         Behavior: Behavior normal.    I have personally reviewed the labs and other data, making special note of:   Today's Data  Creatinine down to 1.55 Mg up to 1.7   Scheduled Meds:  vitamin C  250 mg Oral BID   feeding supplement  1 Container Oral TID BM   fluticasone furoate-vilanterol  1 puff Inhalation Daily   [START ON 07/07/2021] levothyroxine  112 mcg Oral Once per day on Mon Tue Wed Thu Fri Sat   And   [START ON 07/09/2021] levothyroxine  56 mcg Oral Once per day on Sun   multivitamin with minerals  1 tablet Oral Daily   pantoprazole  40 mg Oral BID   Continuous Infusions:  sodium chloride     levETIRAcetam 500 mg (07/06/21 1352)   magnesium sulfate bolus IVPB      Principal Problem:   AKI (acute kidney injury) (HCC) Active Problems:   Seizure (HCC)   Type 2 diabetes mellitus with stage 3a chronic kidney disease, without long-term current use of insulin (HCC)   Chronic diastolic CHF (congestive heart failure) (HCC)   Acute metabolic encephalopathy   Moderate persistent asthma, uncomplicated   Bilateral pulmonary embolism (HCC)   Lactic acidosis   Essential hypertension   Hypothyroidism   Diarrhea   Pressure injury of skin   LOS: 5 days   How to contact the [facility] Attending or Consulting provider 7A - 7P or covering provider during after hours 7P -7A, for this patient?  Check the care team in [facility] and look for a) attending/consulting TRH provider listed and b) the [facility] team listed Log into [email or web address] and use [place]'s universal password to access. If you do not have the password, please contact the hospital operator. Locate the [facility] provider you are looking for under [facility] and page to a number that you can be directly reached. If you still have difficulty reaching the provider, please page the [facility] (Director on Call) for the Hospitalists listed on amion for assistance.

## 2021-07-06 NOTE — Progress Notes (Signed)
Physical Therapy Treatment Patient Details Name: [name] MRN: [id number] DOB: 04-05-1951 Today's Date: 07/06/2021    History of Present Illness 70 y/o female presented to [facility] ED on 7/16 for confusion and a period of "twitching" at rehab facility. EEG on 7/18 revealed one seizure. Trasnferred to [facility]. Long term EEG on 7/20 revealed no seizures. Recently d/c'ed to SNF after recent admission 6/10-7/8 for perforated diveriticulitis with abscess. PMH: anemia, CHF, DM, graves disease, HTN, obesity, stroke, seizure, and thyroid disease    PT Comments    Patient seen in conjunction with OT to maximize activity tolerance. Mobility limited due to long term EEG running. Patient required modA for supine>sit with difficulty lifting bilateral LE against gravity. Patient performed sit to stand x 2 with modA+2 and RW. Patient able to stand 1-2 minutes to perform totalA pericare. Continue to recommend SNF for ongoing Physical Therapy.       Follow Up Recommendations  SNF     Equipment Recommendations  None recommended by PT    Recommendations for Other Services       Precautions / Restrictions Precautions Precautions: Fall Precaution Comments: monitor HR, seizure precaution Restrictions Weight Bearing Restrictions: No    Mobility  Bed Mobility Overal bed mobility: Needs Assistance Bed Mobility: Supine to Sit;Sit to Supine     Supine to sit: Mod assist Sit to supine: Max assist;+2 for physical assistance;+2 for safety/equipment   General bed mobility comments: modA for bringing LEs off bed and trunk elevation. MaxA+2 to return to supine with assist for trunk guidance and lifting LEs onto bed    Transfers Overall transfer level: Needs assistance Equipment used: Rolling Khadijatou Borak (2 wheeled) Transfers: Sit to/from Stand Sit to Stand: Mod assist;+2 physical assistance;+2 safety/equipment         General transfer comment: modA+2 to stand from low bed surface x 2. Able to stand  ~1-2 minutes for totalA pericare due to bowel incontinence during first stand.  Ambulation/Gait Ambulation/Gait assistance: Min assist;+2 safety/equipment Gait Distance (Feet): 2 Feet Assistive device: Rolling Julieanna Geraci (2 wheeled)   Gait velocity: decreased   General Gait Details: sidesteps towards [facility] with RW, cues for RW management as patient would leave behind. MinA for balance and Rw management   Stairs             Wheelchair Mobility    Modified Rankin (Stroke Patients Only)       Balance Overall balance assessment: Needs assistance Sitting-balance support: Feet supported;Bilateral upper extremity supported Sitting balance-Leahy Scale: Fair     Standing balance support: Bilateral upper extremity supported Standing balance-Leahy Scale: Poor Standing balance comment: reliant on UE support                            Cognition Arousal/Alertness: Awake/alert Behavior During Therapy: WFL for tasks assessed/performed Overall Cognitive Status: Within Functional Limits for tasks assessed                                 General Comments: increased time to process cues and sequencing      Exercises      General Comments        Pertinent Vitals/Pain Pain Assessment: Faces Faces Pain Scale: Hurts a little bit Pain Location: bilateral knees Pain Descriptors / Indicators: Discomfort Pain Intervention(s): Monitored during session;Repositioned    Home Living  Prior Function            PT Goals (current goals can now be found in the care plan section) Acute Rehab PT Goals Patient Stated Goal: to get better PT Goal Formulation: With patient Time For Goal Achievement: 07/18/21 Potential to Achieve Goals: Fair Progress towards PT goals: Progressing toward goals    Frequency    Min 3X/week      PT Plan Current plan remains appropriate    Co-evaluation PT/OT/SLP Co-Evaluation/Treatment:  Yes Reason for Co-Treatment: For patient/therapist safety;To address functional/ADL transfers PT goals addressed during session: Mobility/safety with mobility;Balance;Proper use of DME        AM-PAC PT "6 Clicks" Mobility   Outcome Measure  Help needed turning from your back to your side while in a flat bed without using bedrails?: A Lot Help needed moving from lying on your back to sitting on the side of a flat bed without using bedrails?: A Lot Help needed moving to and from a bed to a chair (including a wheelchair)?: Total Help needed standing up from a chair using your arms (e.g., wheelchair or bedside chair)?: Total Help needed to walk in hospital room?: Total Help needed climbing 3-5 steps with a railing? : Total 6 Click Score: 8    End of Session Equipment Utilized During Treatment: Gait belt Activity Tolerance: Patient tolerated treatment well Patient left: in bed;with call bell/phone within reach;with bed alarm set;with family/visitor present;with nursing/sitter in room Nurse Communication: Mobility status PT Visit Diagnosis: Muscle weakness (generalized) (M62.81);Difficulty in walking, not elsewhere classified (R26.2);Unsteadiness on feet (R26.81);Other abnormalities of gait and mobility (R26.89)     Time: 8938-1017 PT Time Calculation (min) (ACUTE ONLY): 36 min  Charges:  $Therapeutic Activity: 8-22 mins                     Constantin Hillery A. Dan Humphreys PT, DPT Acute Rehabilitation Services Pager [phone] Office [phone]    Viviann Spare 07/06/2021, 2:14 PM

## 2021-07-06 NOTE — Procedures (Addendum)
Patient Name: [name]  MRN: [id number]  Epilepsy Attending: Charlsie Quest  Referring Physician/Provider: Dr Erick Blinks Duration: 07/05/2021 0857 to 07/06/2021 0857   Patient history: 70 year old female with altered mental status and seizure-like activity.  EEG to evaluate for seizures.   Level of alertness: Awake, asleep   AEDs during EEG study: LEV   Technical aspects: This EEG study was done with scalp electrodes positioned according to the 10-20 International system of electrode placement. Electrical activity was acquired at a sampling rate of 500Hz  and reviewed with a high frequency filter of 70Hz  and a low frequency filter of 1Hz . EEG data were recorded continuously and digitally stored.   Description: The posterior dominant rhythm consists of 8-9 Hz activity of moderate voltage (25-35 uV) seen predominantly in posterior head regions, symmetric and reactive to eye opening and eye closing. Sleep was characterized by vertex waves, sleep spindles (12 to 14 Hz), maximal frontocentral region.   Hyperventilation and photic stimulation were not performed.     IMPRESSION: This study is within normal limits. No seizures or epileptiform discharges were seen during the study.   [name] 

## 2021-07-06 NOTE — Progress Notes (Signed)
LTM EEG discontinued - no skin breakdown at unhook.   

## 2021-07-06 NOTE — Progress Notes (Signed)
Occupational Therapy Treatment Patient Details Name: [name] MRN: [id number] DOB: [birth year] Today's Date: 07/06/2021    History of present illness 70 y/o female presented to [facility] ED on 7/16 for confusion and a period of "twitching" at rehab facility. EEG on 7/18 revealed one seizure. Trasnferred to [facility]. Long term EEG on 7/20 revealed no seizures. Recently d/c'ed to SNF after recent admission 6/10-7/8 for perforated diveriticulitis with abscess. PMH: anemia, CHF, DM, graves disease, HTN, obesity, stroke, seizure, and thyroid disease   OT comments  Pt progressing towards acute OT goals. Able to sidestep along EOB this session with mod +2 assist. D/c plan remains appropriate.   Follow Up Recommendations  SNF    Equipment Recommendations  None recommended by OT    Recommendations for Other Services      Precautions / Restrictions Precautions Precautions: Fall Precaution Comments: monitor HR, seizure precaution Restrictions Weight Bearing Restrictions: No       Mobility Bed Mobility Overal bed mobility: Needs Assistance Bed Mobility: Supine to Sit;Sit to Supine     Supine to sit: Mod assist Sit to supine: Max assist;+2 for physical assistance;+2 for safety/equipment   General bed mobility comments: modA for bringing LEs off bed and trunk elevation. MaxA+2 to return to supine with assist for trunk guidance and lifting LEs onto bed    Transfers Overall transfer level: Needs assistance Equipment used: Rolling walker (2 wheeled) Transfers: Sit to/from Stand Sit to Stand: Mod assist;+2 physical assistance;+2 safety/equipment         General transfer comment: modA+2 to stand from low bed surface x 2. Able to stand ~1-2 minutes for totalA pericare due to bowel incontinence during first stand.    Balance Overall balance assessment: Needs assistance Sitting-balance support: Feet supported;Bilateral upper extremity supported Sitting balance-Leahy Scale: Fair      Standing balance support: Bilateral upper extremity supported Standing balance-Leahy Scale: Poor Standing balance comment: reliant on UE support                           ADL either performed or assessed with clinical judgement   ADL Overall ADL's : Needs assistance/impaired                         Toilet Transfer: Moderate assistance;+2 for physical assistance;Stand-pivot;BSC             General ADL Comments: Pt able to take sidesteps along EOB this session with +2 assist, up to mod A.     Vision       Perception     Praxis      Cognition Arousal/Alertness: Awake/alert Behavior During Therapy: WFL for tasks assessed/performed Overall Cognitive Status: Within Functional Limits for tasks assessed                                 General Comments: increased time to process cues and sequencing        Exercises     Shoulder Instructions       General Comments      Pertinent Vitals/ Pain       Pain Assessment: Faces Faces Pain Scale: Hurts a little bit Pain Location: bilateral knees Pain Descriptors / Indicators: Discomfort Pain Intervention(s): Monitored during session;Repositioned  Home Living  Prior Functioning/Environment              Frequency  Min 2X/week        Progress Toward Goals  OT Goals(current goals can now be found in the care plan section)  Progress towards OT goals: Progressing toward goals  Acute Rehab OT Goals Patient Stated Goal: to get better OT Goal Formulation: With patient Time For Goal Achievement: 07/17/21 Potential to Achieve Goals: Good ADL Goals Pt Will Perform Grooming: with modified independence Pt Will Perform Upper Body Bathing: with modified independence Pt Will Transfer to Toilet: with min assist Pt Will Perform Toileting - Clothing Manipulation and hygiene: with min assist Additional ADL Goal #1: patient to  preform UB bathing tasks sitting in chair to increase activity tolerance and time out of bed.  Plan Discharge plan remains appropriate    Co-evaluation    PT/OT/SLP Co-Evaluation/Treatment: Yes Reason for Co-Treatment: For patient/therapist safety;To address functional/ADL transfers PT goals addressed during session: Mobility/safety with mobility;Balance;Proper use of DME OT goals addressed during session: ADL's and self-care;Strengthening/ROM      AM-PAC OT "6 Clicks" Daily Activity     Outcome Measure   Help from another person eating meals?: A Little Help from another person taking care of personal grooming?: A Little Help from another person toileting, which includes using toliet, bedpan, or urinal?: A Lot Help from another person bathing (including washing, rinsing, drying)?: A Lot Help from another person to put on and taking off regular upper body clothing?: A Lot Help from another person to put on and taking off regular lower body clothing?: A Lot 6 Click Score: 14    End of Session Equipment Utilized During Treatment: Gait belt;Rolling walker  OT Visit Diagnosis: Unsteadiness on feet (R26.81);Muscle weakness (generalized) (M62.81);Other symptoms and signs involving cognitive function   Activity Tolerance Patient tolerated treatment well   Patient Left in bed;with call bell/phone within reach;with bed alarm set;with nursing/sitter in room;with family/visitor present   Nurse Communication          Time: 2423-5361 OT Time Calculation (min): 35 min  Charges: OT General Charges $OT Visit: 1 Visit OT Treatments $Self Care/Home Management : 8-22 mins  Raynald Kemp, OT Acute Rehabilitation Services Pager: [phone] Office: [phone]    Pilar Grammes 07/06/2021, 2:29 PM

## 2021-07-06 NOTE — Procedures (Signed)
Patient Name: [name]  MRN: [id number]  Epilepsy Attending: Charlsie Quest  Referring Physician/Provider: Dr Erick Blinks Duration: 07/06/2021 0857 to 07/06/2021 1416   Patient history: 70 year old female with altered mental status and seizure-like activity.  EEG to evaluate for seizures.   Level of alertness: Awake, asleep   AEDs during EEG study: LEV   Technical aspects: This EEG study was done with scalp electrodes positioned according to the 10-20 International system of electrode placement. Electrical activity was acquired at a sampling rate of 500Hz  and reviewed with a high frequency filter of 70Hz  and a low frequency filter of 1Hz . EEG data were recorded continuously and digitally stored.   Description: The posterior dominant rhythm consists of 8-9 Hz activity of moderate voltage (25-35 uV) seen predominantly in posterior head regions, symmetric and reactive to eye opening and eye closing. Sleep was characterized by vertex waves, sleep spindles (12 to 14 Hz), maximal frontocentral region.   Hyperventilation and photic stimulation were not performed.      IMPRESSION: This study is within normal limits. No seizures or epileptiform discharges were seen during the study.   Spike Desilets 

## 2021-07-06 NOTE — Plan of Care (Signed)

## 2021-07-07 DIAGNOSIS — N179 Acute kidney failure, unspecified: Secondary | ICD-10-CM | POA: Diagnosis not present

## 2021-07-07 DIAGNOSIS — R569 Unspecified convulsions: Secondary | ICD-10-CM | POA: Diagnosis not present

## 2021-07-07 LAB — BASIC METABOLIC PANEL
Anion gap: 6 (ref 5–15)
BUN: 7 mg/dL — ABNORMAL LOW (ref 8–23)
CO2: 21 mmol/L — ABNORMAL LOW (ref 22–32)
Calcium: 8.3 mg/dL — ABNORMAL LOW (ref 8.9–10.3)
Chloride: 110 mmol/L (ref 98–111)
Creatinine, Ser: 1.48 mg/dL — ABNORMAL HIGH (ref 0.44–1.00)
GFR, Estimated: 38 mL/min — ABNORMAL LOW (ref >60)
Glucose, Bld: 91 mg/dL (ref 70–99)
Potassium: 4.3 mmol/L (ref 3.5–5.1)
Sodium: 137 mmol/L (ref 135–145)

## 2021-07-07 LAB — CULTURE, BLOOD (SINGLE)
Culture: NO GROWTH
Special Requests: ADEQUATE

## 2021-07-07 MED ORDER — LEVETIRACETAM 500 MG PO TABS
500.0000 mg | ORAL_TABLET | Freq: Two times a day (BID) | ORAL | Status: DC
  Administered 2021-07-07 – 2021-07-12 (×10): 500 mg via ORAL
  Filled 2021-07-07 (×10): qty 1

## 2021-07-07 NOTE — Progress Notes (Signed)
Physical Therapy Treatment Patient Details Name: [name] MRN: [id number] DOB: [birth year] Today's Date: 07/07/2021    History of Present Illness 70 y/o female presented to [facility] ED on 7/16 for confusion and a period of "twitching" at rehab facility. EEG on 7/18 revealed one seizure. Trasnferred to [facility]. Long term EEG on 7/20 revealed no seizures. Recently d/c'ed to SNF after recent admission 6/10-7/8 for perforated diveriticulitis with abscess. PMH: anemia, CHF, DM, graves disease, HTN, obesity, stroke, seizure, and thyroid disease    PT Comments    Patient progressing towards physical therapy goals. Patient initially required minA to stand from low bed surface progressing to sit to stand x 5 with min guard and RW. Patient able to take steps fwd/bwd with minA and RW. Performed seated exercises to increase strength in bilateral LE. Continue to recommend SNF for ongoing Physical Therapy.       Follow Up Recommendations  SNF     Equipment Recommendations  None recommended by PT    Recommendations for Other Services       Precautions / Restrictions Precautions Precautions: Fall Precaution Comments: monitor HR, seizure precaution Restrictions Weight Bearing Restrictions: No    Mobility  Bed Mobility Overal bed mobility: Needs Assistance Bed Mobility: Supine to Sit;Sit to Supine     Supine to sit: Min assist Sit to supine: Min assist   General bed mobility comments: minA for repositioning hips towards EOB and bringing LEs back onto bed    Transfers Overall transfer level: Needs assistance Equipment used: Rolling Victoriya Pol (2 wheeled) Transfers: Sit to/from Stand Sit to Stand: Min assist;Min guard         General transfer comment: minA for initial stand from EOB with RW. Performed sit to stand x 5 with min guard  Ambulation/Gait Ambulation/Gait assistance: Min assist Gait Distance (Feet): 4 Feet Assistive device: Rolling Bea Duren (2 wheeled) Gait  Pattern/deviations: Step-to pattern;Decreased stride length;Decreased weight shift to right;Decreased weight shift to left;Trunk flexed;Wide base of support Gait velocity: decreased   General Gait Details: 4' fwd/4' bwd with RW and minA for balance and RW management as patient tends to leave RW too far out in front. Cues for close proximity to RW.   Stairs             Wheelchair Mobility    Modified Rankin (Stroke Patients Only)       Balance Overall balance assessment: Needs assistance Sitting-balance support: No upper extremity supported;Feet supported Sitting balance-Leahy Scale: Fair     Standing balance support: Bilateral upper extremity supported Standing balance-Leahy Scale: Poor Standing balance comment: reliant on UE support                            Cognition Arousal/Alertness: Awake/alert Behavior During Therapy: WFL for tasks assessed/performed Overall Cognitive Status: Within Functional Limits for tasks assessed                                        Exercises General Exercises - Lower Extremity Quad Sets: Both;5 reps;Supine Long Arc Quad: Both;10 reps;Seated Hip Flexion/Marching: Both;10 reps;Seated Toe Raises: Both;10 reps;Seated Heel Raises: Both;10 reps;Seated    General Comments        Pertinent Vitals/Pain Pain Assessment: No/denies pain    Home Living  Prior Function            PT Goals (current goals can now be found in the care plan section) Acute Rehab PT Goals Patient Stated Goal: to get better PT Goal Formulation: With patient Time For Goal Achievement: 07/18/21 Potential to Achieve Goals: Fair Progress towards PT goals: Progressing toward goals    Frequency    Min 3X/week      PT Plan Current plan remains appropriate    Co-evaluation              AM-PAC PT "6 Clicks" Mobility   Outcome Measure  Help needed turning from your back to your side while  in a flat bed without using bedrails?: A Little Help needed moving from lying on your back to sitting on the side of a flat bed without using bedrails?: A Little Help needed moving to and from a bed to a chair (including a wheelchair)?: A Little Help needed standing up from a chair using your arms (e.g., wheelchair or bedside chair)?: A Little Help needed to walk in hospital room?: A Little Help needed climbing 3-5 steps with a railing? : Total 6 Click Score: 16    End of Session Equipment Utilized During Treatment: Gait belt Activity Tolerance: Patient tolerated treatment well Patient left: in bed;with call bell/phone within reach;with bed alarm set Nurse Communication: Mobility status PT Visit Diagnosis: Muscle weakness (generalized) (M62.81);Difficulty in walking, not elsewhere classified (R26.2);Unsteadiness on feet (R26.81);Other abnormalities of gait and mobility (R26.89)     Time: 6789-3810 PT Time Calculation (min) (ACUTE ONLY): 32 min  Charges:  $Therapeutic Exercise: 8-22 mins $Therapeutic Activity: 8-22 mins                     [name] A. Dan Humphreys PT, DPT Acute Rehabilitation Services Pager [phone] Office [phone]    [name] 07/07/2021, 5:23 PM

## 2021-07-07 NOTE — Progress Notes (Signed)
Neurology Progress Note  Brief HPI: 70 y.o. female with PMHx of CHF, OSA, obesity, remote left parieto-occipital ICH in 2007, history of seizures in the setting of HTN off of Keppra without known seizure recurrence, CKD 3A, DM2, hypothyroidism, and recent prolonged hospitalization in June 2022 for diverticulitis who presented to [facility] 7/16 from her SNF for evaluation of decreased appetite, poor PO intake, diarrhea, lethargy, weakness, and confusion with twitching. Initial work up revealed AKI with dehydration. Routine EEG revealed a 50 second left parieto-occipital seizure without clinical signs. She was loaded with Keppra and transferred to [facility] for neurology evaluation and cEEG. Regarding her seizure history, she claims she had one seizure in 2011 and has since stopped taking Keppra since she has not had any seizures since. She also endorses weakness since her month long hospitalization in June of 2022.   Subjective: No acute overnight events No further clinical or subclinical seizures EEG WNL, discontinued Patient does endorse intermittent diplopia on assessment that clears with eye blinking. Otherwise no new complaints.  Exam: Vitals:   07/07/21 0350 07/07/21 0351  BP: (!) 87/64   Pulse: 87 91  Resp: 18   Temp: 98.2 F (36.8 C)   SpO2:  96%   Gen: Laying comfortably in bed, sleeping initially, in no acute distress Resp: non-labored breathing, no respiratory distress Abd: soft, non-tender, non-distended  Neuro: Mental Status: Asleep, wakes easily to voice. She is alert to person, place, time, and situation.  She is able to provide a clear and coherent history of present illness. Good attention. Speech is intact without dysarthria. Naming, repetition, comprehension, and fluency are intact.  No signs of aphasia or neglect noted.  Cranial Nerves: PERRL, EOMI without ptosis, visual fields are full, facial sensation intact and symmetric to light touch, face is symmetric resting and  smiling, hearing is intact to voice, palate elevates symmetrically, shoulders shrug symmetrically, phonation normal, tongue protrudes midline Motor: Generalized weakness notes Bilateral upper extremities 3/5 deltoid, 4/5 biceps, triceps, and grip strength Bilateral lower extremities 4/5 strength throughout  Tone is normal, bulk is decreased.  Sensory: Sensation to light touch intact and symmetric throughout each extremity x 4 Gait: Deferred  Pertinent Labs: CBC    Component Value Date/Time   WBC 8.1 07/05/2021 0337   RBC 2.56 (L) 07/05/2021 0337   HGB 8.0 (L) 07/05/2021 0337   HCT 25.4 (L) 07/05/2021 0337   PLT 186 07/05/2021 0337   MCV 99.2 07/05/2021 0337   MCH 31.3 07/05/2021 0337   MCHC 31.5 07/05/2021 0337   RDW 17.7 (H) 07/05/2021 0337   LYMPHSABS 1.5 07/04/2021 1037   MONOABS 1.0 07/04/2021 1037   EOSABS 0.2 07/04/2021 1037   BASOSABS 0.1 07/04/2021 1037   CMP     Component Value Date/Time   NA 137 07/07/2021 0611   NA 141 11/07/2020 1058   K 4.3 07/07/2021 0611   CL 110 07/07/2021 0611   CO2 21 (L) 07/07/2021 0611   GLUCOSE 91 07/07/2021 0611   BUN 7 (L) 07/07/2021 0611   BUN 13 11/07/2020 1058   CREATININE 1.48 (H) 07/07/2021 0611   CALCIUM 8.3 (L) 07/07/2021 0611   PROT 5.1 (L) 07/04/2021 1037   PROT 6.5 11/07/2020 1058   ALBUMIN 2.0 (L) 07/05/2021 0337   ALBUMIN 4.3 11/07/2020 1058   AST 45 (H) 07/04/2021 1037   ALT 21 07/04/2021 1037   ALKPHOS 63 07/04/2021 1037   BILITOT 0.7 07/04/2021 1037   BILITOT 0.6 11/07/2020 1058   GFRNONAA 38 (  L) 07/07/2021 0611   GFRAA 61 11/07/2020 1058   Urinalysis    Component Value Date/Time   COLORURINE YELLOW 07/04/2021 1800   APPEARANCEUR CLEAR 07/04/2021 1800   LABSPEC 1.012 07/04/2021 1800   PHURINE 5.0 07/04/2021 1800   GLUCOSEU NEGATIVE 07/04/2021 1800   GLUCOSEU NEGATIVE 07/13/2020 0900   HGBUR NEGATIVE 07/04/2021 1800   BILIRUBINUR NEGATIVE 07/04/2021 1800   KETONESUR NEGATIVE 07/04/2021 1800    PROTEINUR NEGATIVE 07/04/2021 1800   UROBILINOGEN 0.2 07/13/2020 0900   NITRITE NEGATIVE 07/04/2021 1800   LEUKOCYTESUR TRACE (A) 07/04/2021 1800   Imaging Reviewed:  CT Head without contrast 07/02/2021: No acute intracranial abnormality. 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  rEEG 07/03/2021:  This study showed one seizure without clinical signs on 07/03/2021 at 0953 arising from left parieto-occipital region, lasting 50 seconds.  cEEG: Pending.   Assessment: 70 y.o. female who presented to [facility] from SNF with poor po intake and no appetite, diarrhea and confusion with twitching. Found to have AKI, dehydration and a subclinical left parieto-occipital seizure on routine EEG. She was started on Keppra and transferred to [facility] for cEEG.  - Examination reveals proximal muscle weakness in all extremities consistent with deconditioning. - As for her seizure, she had one seizure more than a decade ago and stopped Keppra due to no further seizures. She does have seizure risk factors including prior L parieto-occipital hemorrhagic stroke which was likely the focus for her seizure.  - rEEG with evidence os subclinical seizure arising from left parieto-occipital region, lasting 50 seconds. Patient loaded with Keppra and started on maintenance Keppra 500 mg BID. LTM EEG WNL after starting keppra. Will defer MRI brain for now with known seizure focus; remote left parieto-occipital ICH with left parieto-occipital seizure identified on routine EEG.    Impression:  Remote left parieto-occipital ICH in 2007 History of seizures not previously on Keppra Subclinical seizure identified on rEEG 07/03/2021- left parieto-occipital region   Recommendations: - Continue Keppra 500 mg BID - Inpatient seizure precautions - Ativan 1mg  IV for any seizure lasting > 5 minutes and notify neurology immediately - No further inpatient neurologic workup indicated at this  time. When patient is discharged from hospital she should be continued on keppra 500mg  bid and instructed not to drive for 6 mos after last seizure. I will arrange outpatient neurology f/u. Neurology to sign off, but please re-engage if additional neurologic concerns arise.  , MD [facility] [phone]  If 7pm- 7am, please page neurology on call as listed in AMION.

## 2021-07-07 NOTE — TOC Progression Note (Signed)
Transition of Care [facility]) - Progression Note    Patient Details  Name: [name] MRN: [id number] Date of Birth: Jan 12, 1951  [facility] CM/SW Contact  Carley Hammed, [place] Phone Number: 07/07/2021, 5:08 PM  Clinical Narrative:    CSW followed up with facility, authorization is still pending at this time. They will let CSW know if Berkley Harvey is received and may be able to take over the weekend. SW will continue to follow.   Expected Discharge Plan: Skilled Nursing Facility Barriers to Discharge: No Barriers Identified  Expected Discharge Plan and Services Expected Discharge Plan: Skilled Nursing Facility   Discharge Planning Services: CM Consult   Living arrangements for the past 2 months: Skilled Nursing Facility                                       Social Determinants of Health (SDOH) Interventions    Readmission Risk Interventions No flowsheet data found.

## 2021-07-07 NOTE — Progress Notes (Signed)
PROGRESS NOTE  [name] PYK:[id number] DOB: [birth year] DOA: 07/01/2021 PCP: Ollen Bowl, MD  Brief History   70 year old woman presented with lethargy, generalized weakness, diarrhea, poor oral intake, shaking episode admitted for AKI, acute metabolic encephalopathy, possible seizure.  EEG showed subclinical seizure, transferred to [facility] for long-term video monitoring and neurology evaluation.  Continuous EEG was unrevealing.  Neurology recommended continuing Keppra, no driving, follow-up with neurology as an outpatient.  Acute kidney injury improving, expect spontaneous resolution at this point.  Medically stable for transfer to skilled nursing facility.  Authorization process.  May be able to go this weekend.  A & P  * AKI (acute kidney injury) (HCC) -- In context of poor oral intake and diarrhea, outpatient Lasix.  CT abdomen pelvis and renal ultrasound without significant findings. -- Significant improvement with fluids.  Lasix on hold.  Mild lower extremity edema.  Oral fluids only.  Suggest holding Lasix on discharge for few days with close attention to restarting this as an outpatient and follow-up BMP.  Seizure (HCC) --  seen on spot EEG.  Started on Keppra.  Long-term EEG unrevealing.  Continue Keppra on discharge, follow-up with neurology as an outpatient.  Seizure precautions, no driving.  Type 2 diabetes mellitus with stage 3a chronic kidney disease, without long-term current use of insulin (HCC) -- CBG stable, no need to monitor, no need for sliding scale insulin.  Acute metabolic encephalopathy -- Probably multifactorial including acute kidney injury, seizure. -- Resolved.  Chronic diastolic CHF (congestive heart failure) (HCC) -- LVEF 65-70%, indeterminant diastolic function.  Mild lower extremity edema as noted above.  Hold Lasix 1-3 more days and then resume.  Follow kidney function as an outpatient.  Diarrhea -- Improved.  C. difficile negative.   Continue Imodium.  Present prior to admission, may be related to recent bout with diverticulitis.  Skin Assessment: Pressure Injury 07/02/21 Buttocks Left Stage 2 -  Partial thickness loss of dermis presenting as a shallow open injury with a red, pink wound bed without slough. (Active)  07/02/21 0923  Location: Buttocks  Location Orientation: Left  Staging: Stage 2 -  Partial thickness loss of dermis presenting as a shallow open injury with a red, pink wound bed without slough.  Wound Description (Comments):   Present on Admission: Yes   Disposition Plan:  Discussion: Continues to improve.  Stable for transfer to SNF.  Status is: Inpatient  Remains inpatient appropriate because:IV treatments appropriate due to intensity of illness or inability to take PO and Inpatient level of care appropriate due to severity of illness  Dispo: The patient is from: SNF              Anticipated d/c is to: SNF              Patient currently is medically stable to d/c.   Difficult to place patient No  DVT prophylaxis: SCDs Start: 07/02/21 0029   Code Status: Full Code Level of care: Telemetry Medical Family Communication:    Brendia Sacks, MD  [facility] Direct contact: see [email or web address] (further directions at bottom of note if needed) 7PM-7AM contact night coverage as at bottom of note 07/07/2021, 6:03 PM  LOS: 6 days    Consults:  Neurology    Procedures:    Significant Diagnostic Tests:  EEG   Interval History/Subjective  CC: f/u seizure  No complaints No seizures Feels fine  Objective   Vitals:  Vitals:   07/07/21 1127 07/07/21 1705  BP:  102/69 116/70  Pulse: 91 92  Resp: 18 20  Temp: 98 F (36.7 C) 97.6 F (36.4 C)  SpO2: 99% 98%    Exam: Physical Exam Vitals reviewed.  Constitutional:      Appearance: Normal appearance. She is not ill-appearing.  Cardiovascular:     Rate and Rhythm: Normal rate and regular rhythm.     Heart sounds: No murmur  heard. Pulmonary:     Effort: Pulmonary effort is normal. No respiratory distress.     Breath sounds: Normal breath sounds. No wheezing or rales.  Musculoskeletal:     Right lower leg: Edema present.     Left lower leg: Edema present.  Neurological:     Mental Status: She is alert.  Psychiatric:        Mood and Affect: Mood normal.        Behavior: Behavior normal.    I have personally reviewed the labs and other data, making special note of:   Today's Data  Creatinine down to 1.48  Scheduled Meds:  vitamin C  250 mg Oral BID   feeding supplement  1 Container Oral TID BM   fluticasone furoate-vilanterol  1 puff Inhalation Daily   levETIRAcetam  500 mg Oral BID   levothyroxine  112 mcg Oral Once per day on Mon Tue Wed Thu Fri Sat   And   [START ON 07/09/2021] levothyroxine  56 mcg Oral Once per day on Sun   multivitamin with minerals  1 tablet Oral Daily   pantoprazole  40 mg Oral BID   Continuous Infusions:   Principal Problem:   AKI (acute kidney injury) (HCC) Active Problems:   Seizure (HCC)   Type 2 diabetes mellitus with stage 3a chronic kidney disease, without long-term current use of insulin (HCC)   Chronic diastolic CHF (congestive heart failure) (HCC)   Acute metabolic encephalopathy   Essential hypertension   Hypothyroidism   Diarrhea   Pressure injury of skin   LOS: 6 days   How to contact the [facility] Attending or Consulting provider 7A - 7P or covering provider during after hours 7P -7A, for this patient?  Check the care team in [facility] and look for a) attending/consulting TRH provider listed and b) the [facility] team listed Log into [email or web address] and use [place]'s universal password to access. If you do not have the password, please contact the hospital operator. Locate the [facility] provider you are looking for under [facility] and page to a number that you can be directly reached. If you still have difficulty reaching the provider, please page the [facility] (Director  on Call) for the Hospitalists listed on amion for assistance.

## 2021-07-08 DIAGNOSIS — N179 Acute kidney failure, unspecified: Secondary | ICD-10-CM | POA: Diagnosis not present

## 2021-07-08 NOTE — Progress Notes (Signed)
PROGRESS NOTE  [name] WUJ:[id number] DOB: August 25, 1951 DOA: 07/01/2021 PCP: Ollen Bowl, MD  Brief History   70 year old woman presented with lethargy, generalized weakness, diarrhea, poor oral intake, shaking episode admitted for AKI, acute metabolic encephalopathy, possible seizure.  EEG showed subclinical seizure, transferred to [facility] for long-term video monitoring and neurology evaluation.  Continuous EEG was unrevealing.  Neurology recommended continuing Keppra, no driving, follow-up with neurology as an outpatient.  Acute kidney injury improving, expect spontaneous resolution at this point.  Medically stable for transfer to skilled nursing facility.  Authorization process.  May be able to go this weekend.  A & P   AKI (acute kidney injury) (HCC), likely pre-renal, resolving No history of CKD      -- Secondary to poor oral intake and diarrhea, outpatient Lasix.  CT abdomen pelvis and renal ultrasound without significant findings. -- Significant improvement with fluids.  Lasix on hold.  Mild lower extremity edema.  Oral fluids only.  Suggest holding Lasix on discharge for few days with close attention to restarting this as an outpatient and follow-up BMP.  Diarrhea, non-infectious Sepsis ruled out -- Improved.  C. difficile negative.  Continue Imodium.  Present prior to admission, may be related to recent bout with diverticulitis. --Patient also on metformin in the outpatient setting which has been held on admission, she also reports history of celiac disease, concern for dietary noncompliance at facility versus metformin induced GI motility issues.  If symptoms return with reinitiation of metformin would consider discontinuation in the near future.  Acute metabolic encephalopathy, resolved -- Probably multifactorial including acute kidney injury, seizure, and profound dehydration -- Patient at baseline per daughter at bedside today  Seizure [facility]) --Noted on spot EEG.   Continue Keppra.  Long-term EEG unrevealing.   --Follow-up with neurology as an outpatient.  Seizure precautions, no driving.  Type 2 diabetes mellitus without long-term current use of insulin (HCC) -- CBG stable, no need to monitor, no need for sliding scale insulin.  Chronic diastolic CHF (congestive heart failure) (HCC), not in acute exacerbation -- LVEF 65-70%, indeterminant diastolic function.  Mild lower extremity edema as noted above.  Hold Lasix 1-3 more days and then resume.  Follow kidney function as an outpatient.   Skin Assessment: Pressure Injury 07/02/21 Buttocks Left Stage 2 -  Partial thickness loss of dermis presenting as a shallow open injury with a red, pink wound bed without slough. (Active)  07/02/21 0923  Location: Buttocks  Location Orientation: Left  Staging: Stage 2 -  Partial thickness loss of dermis presenting as a shallow open injury with a red, pink wound bed without slough.  Wound Description (Comments):   Present on Admission: Yes   Disposition Plan:  Discussion: Continues to improve.  Stable for transfer to SNF.  Status is: Inpatient  Remains inpatient appropriate because:IV treatments appropriate due to intensity of illness or inability to take PO and Inpatient level of care appropriate due to severity of illness  Dispo: The patient is from: SNF              Anticipated d/c is to: SNF              Patient currently is medically stable to d/c.   Difficult to place patient No  DVT prophylaxis: SCDs Start: 07/02/21 0029   Code Status: Full Code Level of care: Med-Surg Family Communication:    Carma Leaven DO  [facility] Direct contact: Secure chat 7PM-7AM contact night coverage as at  bottom of note 07/08/2021, 8:08 AM  LOS: 7 days    Consults:  Neurology    Procedures:    Significant Diagnostic Tests:  EEG   Interval History/Subjective  CC: f/u seizure  No acute issues events overnight, patient denies nausea vomiting  diarrhea constipation headache fevers chills chest pain shortness of breath.  Objective   Vitals:  Vitals:   07/08/21 0349 07/08/21 0757  BP: (!) 107/56   Pulse: (!) 101   Resp: 18   Temp: 98 F (36.7 C)   SpO2: 98% 98%    Exam: Physical Exam Vitals reviewed.  Constitutional:      Appearance: Normal appearance. She is not ill-appearing.  Cardiovascular:     Rate and Rhythm: Normal rate and regular rhythm.     Heart sounds: No murmur heard. Pulmonary:     Effort: Pulmonary effort is normal. No respiratory distress.     Breath sounds: Normal breath sounds. No wheezing or rales.  Musculoskeletal:     Right lower leg: Edema present.     Left lower leg: Edema present.  Neurological:     Mental Status: She is alert.  Psychiatric:        Mood and Affect: Mood normal.        Behavior: Behavior normal.    I have personally reviewed the labs and other data, making special note of:   Today's Data  Creatinine down to 1.48  Scheduled Meds:  vitamin C  250 mg Oral BID   feeding supplement  1 Container Oral TID BM   fluticasone furoate-vilanterol  1 puff Inhalation Daily   levETIRAcetam  500 mg Oral BID   levothyroxine  112 mcg Oral Once per day on Mon Tue Wed Thu Fri Sat   And   [START ON 07/09/2021] levothyroxine  56 mcg Oral Once per day on Sun   multivitamin with minerals  1 tablet Oral Daily   pantoprazole  40 mg Oral BID   Continuous Infusions:   Principal Problem:   AKI (acute kidney injury) (HCC) Active Problems:   Chronic diastolic CHF (congestive heart failure) (HCC)   Type 2 diabetes mellitus with stage 3a chronic kidney disease, without long-term current use of insulin (HCC)   Seizure (HCC)   Essential hypertension   Hypothyroidism   Acute metabolic encephalopathy   Diarrhea   Pressure injury of skin   LOS: 7 days   How to contact the [facility] Attending or Consulting provider 7A - 7P or covering provider during after hours 7P -7A, for this patient?  Check  the care team in [facility] and look for a) attending/consulting TRH provider listed and b) the [facility] team listed Log into [email or web address] and use [place]'s universal password to access. If you do not have the password, please contact the hospital operator. Locate the [facility] provider you are looking for under [facility] and page to a number that you can be directly reached. If you still have difficulty reaching the provider, please page the [facility] (Director on Call) for the Hospitalists listed on amion for assistance.

## 2021-07-09 DIAGNOSIS — N179 Acute kidney failure, unspecified: Secondary | ICD-10-CM | POA: Diagnosis not present

## 2021-07-09 DIAGNOSIS — E039 Hypothyroidism, unspecified: Secondary | ICD-10-CM | POA: Diagnosis not present

## 2021-07-09 MED ORDER — POLYVINYL ALCOHOL 1.4 % OP SOLN
1.0000 [drp] | OPHTHALMIC | Status: DC | PRN
  Administered 2021-07-09: 1 [drp] via OPHTHALMIC
  Filled 2021-07-09: qty 15

## 2021-07-09 NOTE — Assessment & Plan Note (Signed)
--  continue levothyroxine --TSH 5.312 down for 21.7  --follow-up as an outpatient

## 2021-07-09 NOTE — Progress Notes (Signed)
PROGRESS NOTE  [name] TOI:[id number] DOB: 26-Jun-1951 DOA: 07/01/2021 PCP: Ollen Bowl, MD  Brief History   70 year old woman presented with lethargy, generalized weakness, diarrhea, poor oral intake, shaking episode admitted for AKI, acute metabolic encephalopathy, possible seizure.  EEG showed subclinical seizure, transferred to [facility] for long-term video monitoring and neurology evaluation.  Continuous EEG was unrevealing.  Neurology recommended continuing Keppra, no driving, follow-up with neurology as an outpatient.  Acute kidney injury improving, expect spontaneous resolution at this point.  Medically stable for transfer to skilled nursing facility.  Authorization in process.   A & P  * AKI (acute kidney injury) (HCC) -- In context of poor oral intake and diarrhea, outpatient Lasix.  CT abdomen pelvis and renal ultrasound without significant findings. -- Significant improvement with fluids.  Continue to hold Lasix, oral fluids only.  Suggest holding Lasix on discharge for few days with close attention to restarting this as an outpatient and follow-up BMP.  Seizure (HCC) -- seen on spot EEG.  Started on Keppra.  Long-term EEG unrevealing.  Continue Keppra on discharge, follow-up with neurology as an outpatient.  Seizure precautions, no driving.  Type 2 diabetes mellitus with stage 3a chronic kidney disease, without long-term current use of insulin (HCC) -- CBG stable, no need to monitor, no need for sliding scale insulin.  Acute metabolic encephalopathy -- Probably multifactorial including acute kidney injury, seizure. -- Resolved.  Chronic diastolic CHF (congestive heart failure) (HCC) -- LVEF 65-70%, indeterminant diastolic function.  Mild lower extremity edema as noted above.  Hold Lasix 1-3 more days and then resume.  Follow kidney function as an outpatient.  Diarrhea -- Improved.  C. difficile negative.  Continue Imodium.  Present prior to admission, may be  related to recent bout with diverticulitis, also consider metformin. She also reports history of celiac disease, concern for dietary noncompliance at facility   Hypothyroidism --continue levothyroxine --TSH 5.312 down for 21.7  --follow-up as an outpatient  Skin Assessment: Pressure Injury 07/02/21 Buttocks Left Stage 2 -  Partial thickness loss of dermis presenting as a shallow open injury with a red, pink wound bed without slough. (Active)  07/02/21 0923  Location: Buttocks  Location Orientation: Left  Staging: Stage 2 -  Partial thickness loss of dermis presenting as a shallow open injury with a red, pink wound bed without slough.  Wound Description (Comments):   Present on Admission: Yes   Disposition Plan:  Discussion: Remains stable for transfer to SNF.  Status is: Inpatient  Remains inpatient appropriate because:needs SNF  Dispo: The patient is from: SNF              Anticipated d/c is to: SNF              Patient currently is medically stable to d/c.   Difficult to place patient No  DVT prophylaxis: SCDs Start: 07/02/21 0029   Code Status: Full Code Level of care: Med-Surg Family Communication: daughter Alphonzo Lemmings at bedside  Brendia Sacks, MD  [facility] Direct contact: see [email or web address] (further directions at bottom of note if needed) 7PM-7AM contact night coverage as at bottom of note 07/09/2021, 4:41 PM  LOS: 8 days    Consults:  Neurology    Procedures:    Significant Diagnostic Tests:  EEG   Interval History/Subjective  CC: f/u seizure  No new problems  Objective   Vitals:  Vitals:   07/09/21 1301 07/09/21 1512  BP: 100/65 103/64  Pulse: 95 86  Resp:  18   Temp:  98.2 F (36.8 C)  SpO2: 99% 100%    Exam: Physical Exam Vitals reviewed.  Constitutional:      Appearance: Normal appearance. She is not ill-appearing.  Cardiovascular:     Rate and Rhythm: Normal rate and regular rhythm.     Heart sounds: No murmur heard.    Comments:  Telemetry SR Pulmonary:     Effort: Pulmonary effort is normal. No respiratory distress.     Breath sounds: Normal breath sounds. No wheezing, rhonchi or rales.  Neurological:     Mental Status: She is alert.  Psychiatric:        Mood and Affect: Mood normal.        Behavior: Behavior normal.    I have personally reviewed the labs and other data, making special note of:   Today's Data    Scheduled Meds:  vitamin C  250 mg Oral BID   feeding supplement  1 Container Oral TID BM   fluticasone furoate-vilanterol  1 puff Inhalation Daily   levETIRAcetam  500 mg Oral BID   levothyroxine  112 mcg Oral Once per day on Mon Tue Wed Thu Fri Sat   And   levothyroxine  56 mcg Oral Once per day on Sun   multivitamin with minerals  1 tablet Oral Daily   pantoprazole  40 mg Oral BID   Continuous Infusions:   Principal Problem:   AKI (acute kidney injury) (HCC) Active Problems:   Seizure (HCC)   Type 2 diabetes mellitus with stage 3a chronic kidney disease, without long-term current use of insulin (HCC)   Chronic diastolic CHF (congestive heart failure) (HCC)   Acute metabolic encephalopathy   Essential hypertension   Hypothyroidism   Diarrhea   Pressure injury of skin   LOS: 8 days   How to contact the [facility] Attending or Consulting provider 7A - 7P or covering provider during after hours 7P -7A, for this patient?  Check the care team in [facility] and look for a) attending/consulting TRH provider listed and b) the [facility] team listed Log into [email or web address] and use [place]'s universal password to access. If you do not have the password, please contact the hospital operator. Locate the [facility] provider you are looking for under [facility] and page to a number that you can be directly reached. If you still have difficulty reaching the provider, please page the [facility] (Director on Call) for the Hospitalists listed on amion for assistance.

## 2021-07-09 NOTE — TOC Progression Note (Signed)
Transition of Care [facility]) - Progression Note    Patient Details  Name: [name] MRN: [id number] Date of Birth: [birth year]  Transition of Care [facility]) CM/SW Contact  Carley Hammed, [place] Phone Number: 07/09/2021, 11:39 AM  Clinical Narrative:    CSW followed up with facility as they had started authorization. Facility stated authorization is still pending at this time. TOC will continue to follow for DC notes.   Expected Discharge Plan: Skilled Nursing Facility Barriers to Discharge: No Barriers Identified  Expected Discharge Plan and Services Expected Discharge Plan: Skilled Nursing Facility   Discharge Planning Services: CM Consult   Living arrangements for the past 2 months: Skilled Nursing Facility                                       Social Determinants of Health (SDOH) Interventions    Readmission Risk Interventions No flowsheet data found.

## 2021-07-10 LAB — BASIC METABOLIC PANEL
Anion gap: 5 (ref 5–15)
BUN: 7 mg/dL — ABNORMAL LOW (ref 8–23)
CO2: 20 mmol/L — ABNORMAL LOW (ref 22–32)
Calcium: 8 mg/dL — ABNORMAL LOW (ref 8.9–10.3)
Chloride: 108 mmol/L (ref 98–111)
Creatinine, Ser: 1.21 mg/dL — ABNORMAL HIGH (ref 0.44–1.00)
GFR, Estimated: 48 mL/min — ABNORMAL LOW (ref >60)
Glucose, Bld: 170 mg/dL — ABNORMAL HIGH (ref 70–99)
Potassium: 3.7 mmol/L (ref 3.5–5.1)
Sodium: 133 mmol/L — ABNORMAL LOW (ref 135–145)

## 2021-07-10 LAB — RESP PANEL BY RT-PCR (FLU A&B, COVID) ARPGX2
Influenza A by PCR: NEGATIVE
Influenza B by PCR: NEGATIVE
SARS Coronavirus 2 by RT PCR: NEGATIVE

## 2021-07-10 MED ORDER — ADULT MULTIVITAMIN W/MINERALS CH: 1.0000 | ORAL_TABLET | Freq: Every day | ORAL | Status: AC

## 2021-07-10 MED ORDER — ASCORBIC ACID 250 MG PO TABS: 250.0000 mg | ORAL_TABLET | Freq: Two times a day (BID) | ORAL | Status: DC

## 2021-07-10 MED ORDER — LEVETIRACETAM 500 MG PO TABS: 500.0000 mg | ORAL_TABLET | Freq: Two times a day (BID) | ORAL | Status: AC

## 2021-07-10 MED ORDER — POLYVINYL ALCOHOL 1.4 % OP SOLN: 1.0000 [drp] | OPHTHALMIC | Status: AC | PRN

## 2021-07-10 MED ORDER — PROSOURCE PLUS PO LIQD
30.0000 mL | Freq: Three times a day (TID) | ORAL | Status: DC
  Administered 2021-07-10 – 2021-07-12 (×6): 30 mL via ORAL
  Filled 2021-07-10 (×5): qty 30

## 2021-07-10 NOTE — Plan of Care (Signed)

## 2021-07-10 NOTE — TOC Progression Note (Signed)
Transition of Care [facility]) - Progression Note    Patient Details  Name: Bessie Boyte MRN: [id number] Date of Birth: 1951/02/24  Transition of Care [facility]) CM/SW Contact  Carley Hammed, [place] Phone Number: 07/10/2021, 12:06 PM  Clinical Narrative:    CSW followed up with [facility] and as of noon, Berkley Harvey is still pending. TOC will continue to follow for dc needs.   Expected Discharge Plan: Skilled Nursing Facility Barriers to Discharge: No Barriers Identified  Expected Discharge Plan and Services Expected Discharge Plan: Skilled Nursing Facility   Discharge Planning Services: CM Consult   Living arrangements for the past 2 months: Skilled Nursing Facility                                       Social Determinants of Health (SDOH) Interventions    Readmission Risk Interventions No flowsheet data found.

## 2021-07-10 NOTE — Progress Notes (Signed)
PT Cancellation Note  Patient Details Name: [name] MRN: [id number] DOB: [birth year]   Cancelled Treatment:    Reason Eval/Treat Not Completed: Patient declined. Pt stating she is "angry and frustrated". When asked about it she declined to discuss it. Not willing to work with PT at this time. Pt left with lunch tray and needs in reach. Will follow up as time allows later today vs another date. Acute PT to continue during pt's hospital stay.  Sallyanne Kuster, PTA, CLT Acute Rehab Services [phone] 07/10/21, 1:29 PM    Sallyanne Kuster 07/10/2021, 1:28 PM

## 2021-07-10 NOTE — Progress Notes (Signed)
Nutrition Follow-up  DOCUMENTATION CODES:   Morbid obesity  INTERVENTION:   -Continue Boost Breeze po TID, each supplement provides 250 kcal and 9 grams of protein  -30 ml Prosource Plus TID, each supplement provides 100 kcals and 15 grams protein -MVI with minerals daily  NUTRITION DIAGNOSIS:   Inadequate oral intake related to acute illness as evidenced by other (comment) (pt on clear liquid diet).  Progressing; advanced to regular diet on 07/06/21  GOAL:   Patient will meet greater than or equal to 90% of their needs  Progressing   MONITOR:   PO intake, Supplement acceptance, Diet advancement, Labs, Weight trends, Skin, I & O's  REASON FOR ASSESSMENT:   Consult, Malnutrition Screening Tool Assessment of nutrition requirement/status  ASSESSMENT:   70 y/o female with h/o celiac disease, CHF, depression, DM, emphysema, Grave's disease, HTN, OSA, stroke, ulcer, PE, CKD III, GERD, seizure and recent admission from 6/10-7/8 for perforated diverticulitis requiring IR drain 6/21 and TPN who is now admitted with AKI, AMS and possible seizure.  7/18- s/p abscess drain injection (no evidence of residual abscess cavity or fistula); drain removed  Reviewed I/O's: +80 ml x 24 hours and +8.1 L since admission  UOP: 400 ml x 24 hours  Spoke with pt and daughter at bedside. Per pt, she has been eating very little. She is apprehensive to eat secondary to anticipated pain for diverticulitis in the past. Pt with ongoing poor oral intake over the past month secondary to diverticulitis; daughter explains that pt was in the hospital last month and transferred to rehab secondary to debility as has not ate well since prior admission. Daughter reports pt requires a lot of encouragement to eat and drink supplements. She will drink [employer] with encouragement. Noted meal completion 10-25%.   RD reviewed menu with pt and encouraged pt to order easier to digest foods such as soups and  sandwiches, which pt is agreeable to. Reviewed supplements on the formulary; pt does not want to drink milky supplements such as Ensure, due to prior poor experience ( caused diarrhea). She is amenable to continue Boost Breeze and try Prosource.   Medications reviewed and include vitamin C and keppra.   Labs reviewed.   NUTRITION - FOCUSED PHYSICAL EXAM:  Flowsheet Row Most Recent Value  Orbital Region No depletion  Upper Arm Region No depletion  Thoracic and Lumbar Region No depletion  Buccal Region No depletion  Temple Region No depletion  Clavicle Bone Region No depletion  Clavicle and Acromion Bone Region No depletion  Scapular Bone Region No depletion  Dorsal Hand No depletion  Patellar Region No depletion  Anterior Thigh Region No depletion  Posterior Calf Region No depletion  Edema (RD Assessment) Moderate  Hair Reviewed  Eyes Reviewed  Mouth Reviewed  Skin Reviewed  Nails Reviewed       Diet Order:   Diet Order             Diet regular Room service appropriate? No; Fluid consistency: Thin  Diet effective now                   EDUCATION NEEDS:   No education needs have been identified at this time  Skin:  Skin Assessment: Skin Integrity Issues: Skin Integrity Issues:: Stage II, Other (Comment) Stage II: lt buttcks Other: non-pressure wound to rt thigh and rt lower abdomen  Last BM:  07/08/21  Height:   Ht Readings from Last 1 Encounters:  07/06/21 5\' 2"  (1.575  m)    Weight:   Wt Readings from Last 1 Encounters:  07/06/21 106 kg    Ideal Body Weight:  50 kg  BMI:  Body mass index is 42.74 kg/m.  Estimated Nutritional Needs:   Kcal:  2000-2300kcal/day  Protein:  100-115g/day  Fluid:  1.5-1.8L/day    Levada Schilling, RD, LDN, CDCES Registered Dietitian II Certified Diabetes Care and Education Specialist Please refer to [facility] for RD and/or RD on-call/weekend/after hours pager

## 2021-07-10 NOTE — Discharge Summary (Signed)
Physician Discharge Summary  [name] NWG:[id number] DOB: 12-07-1951 DOA: 07/01/2021  PCP: Ollen Bowl, MD  Admit date: 07/01/2021 Discharge date: 07/10/2021  Recommendations for Outpatient Follow-up:   * AKI (acute kidney injury) (HCC) -- Significant improvement with fluids.  Renal function has nearly normalized.  Can resume Lasix 7/26.  Monitor oral intake closely.  Seizure (HCC) -- seen on spot EEG.  Started on Keppra.  Long-term EEG unrevealing.  Continue Keppra on discharge, follow-up with neurology as an outpatient.  Seizure precautions, no driving.  Per [facility] statutes, patients with seizures are not allowed to drive until they have been seizure-free for six months.  Use caution when using heavy equipment or power tools. Avoid working on ladders or at heights. Take showers instead of baths. Ensure the water temperature is not too high on the home water heater. Do not go swimming alone. Do not lock yourself in a room alone (i.e. bathroom). When caring for infants or small children, sit down when holding, feeding, or changing them to minimize risk of injury to the child in the event you have a seizure. Maintain good sleep hygiene. Avoid alcohol.  If patient has another seizure, call 911 and bring them back to the ED if: A.  The seizure lasts longer than 5 minutes.      B.  The patient doesn't wake shortly after the seizure or has new problems such as difficulty seeing, speaking or moving following the seizure C.  The patient was injured during the seizure D.  The patient has a temperature over 102 F (39C) E.  The patient vomited during the seizure and now is having trouble breathing  Type 2 diabetes mellitus with stage 3a chronic kidney disease, without long-term current use of insulin (HCC) -- CBG stable, no need to monitor, no need for sliding scale insulin. --Given good blood sugar control, stop metformin on discharge  Chronic diastolic CHF (congestive  heart failure) (HCC) -- LVEF 65-70%, indeterminant diastolic function.  Can resume Lasix 7/26.  Hypothyroidism --continue levothyroxine --TSH 5.312 down for 21.7  --follow-up as an outpatient   Follow-up Information     Pahwani, Rinka R, MD. Schedule an appointment as soon as possible for a visit in 3 week(s).   Specialty: Internal Medicine Contact information: [address] [place] [address] [phone]         Nahser, Deloris Ping, MD .   Specialty: Cardiology Contact information: [address] [place] [address] [phone]               Discharge Diagnoses: Principal diagnosis is #1 Principal Problem:   AKI (acute kidney injury) (HCC) Active Problems:   Seizure (HCC)   Type 2 diabetes mellitus with stage 3a chronic kidney disease, without long-term current use of insulin (HCC)   Chronic diastolic CHF (congestive heart failure) (HCC)   Acute metabolic encephalopathy   Essential hypertension   Hypothyroidism   Diarrhea   Pressure injury of skin   Discharge Condition: improved Disposition: SNF  Diet recommendation:  Diet Orders (From admission, onward)     Start     Ordered   07/06/21 1140  Diet regular Room service appropriate? No; Fluid consistency: Thin  Diet effective now       Question Answer Comment  Room service appropriate? No   Fluid consistency: Thin      07/06/21 1140             Filed Weights  07/06/21 0525  Weight: 106 kg    HPI/Hospital Course:   70 year old woman presented with lethargy, generalized weakness, diarrhea, poor oral intake, shaking episode admitted for AKI, acute metabolic encephalopathy, possible seizure.  EEG showed subclinical seizure, transferred to [facility] for long-term video monitoring and neurology evaluation.  Continuous EEG was unrevealing.  Neurology recommended continuing Keppra, no driving, follow-up with neurology as an outpatient.  Acute kidney injury improving, expect  spontaneous resolution at this point.  Medically stable for transfer to skilled nursing facility.    * AKI (acute kidney injury) (HCC) -- In context of poor oral intake and diarrhea, outpatient Lasix.  CT abdomen pelvis and renal ultrasound without significant findings. -- Significant improvement with fluids.  Renal function has nearly normalized.  Can resume Lasix 7/26.  Monitor oral intake closely.  Seizure (HCC) -- seen on spot EEG.  Started on Keppra.  Long-term EEG unrevealing.  Continue Keppra on discharge, follow-up with neurology as an outpatient.  Seizure precautions, no driving.  Type 2 diabetes mellitus with stage 3a chronic kidney disease, without long-term current use of insulin (HCC) -- CBG stable, no need to monitor, no need for sliding scale insulin. --Given good blood sugar control, stop metformin on discharge  Acute metabolic encephalopathy -- Probably multifactorial including acute kidney injury, seizure. -- Resolved.  Chronic diastolic CHF (congestive heart failure) (HCC) -- LVEF 65-70%, indeterminant diastolic function.  Can resume Lasix tomorrow.  Diarrhea -- Resolved it appears.  C. difficile negative.  Continue Imodium.  Present prior to admission, may be related to recent bout with diverticulitis, also consider metformin. She also reports history of celiac disease, concern for dietary noncompliance at facility  --Stop metformin  Hypothyroidism --continue levothyroxine --TSH 5.312 down for 21.7  --follow-up as an outpatient    Consults:  Neurology   Significant Diagnostic Tests:  EEG  Today's assessment: S: CC: f/u seizure  Feels well, no seizures Eating a little better  O: Vitals:  Vitals:   07/10/21 0833 07/10/21 1231  BP: 106/81 106/70  Pulse: (!) 104 (!) 105  Resp: 18 18  Temp: 98.3 F (36.8 C) (!) 97.5 F (36.4 C)  SpO2: 100% 98%    Constitutional:  Appears calm and comfortable ENMT:  grossly normal hearing  Respiratory:  CTA  bilaterally, no w/r/r.  Respiratory effort normal.  Cardiovascular:  RRR, no m/r/g No LE extremity edema of note Psychiatric:  Mental status Mood, affect appropriate  Discharge Instructions   Allergies as of 07/10/2021       Reactions   Gluten Meal    Celiac        Medication List     STOP taking these medications    levofloxacin 750 MG tablet Commonly known as: LEVAQUIN   metFORMIN 500 MG tablet Commonly known as: GLUCOPHAGE   mirtazapine 7.5 MG tablet Commonly known as: REMERON       TAKE these medications    acetaminophen 325 MG tablet Commonly known as: TYLENOL Take 650 mg by mouth every 4 (four) hours as needed for fever.   albuterol 108 (90 Base) MCG/ACT inhaler Commonly known as: VENTOLIN HFA Inhale 2 puffs into the lungs every 6 (six) hours as needed for wheezing or shortness of breath.   Fluticasone-Salmeterol 250-50 MCG/DOSE Aepb Commonly known as: ADVAIR Inhale 1 puff into the lungs daily as needed for allergies.   furosemide 20 MG tablet Commonly known as: LASIX Take 1 tablet (20 mg total) by mouth daily.   levETIRAcetam 500 MG tablet  Commonly known as: KEPPRA Take 1 tablet (500 mg total) by mouth 2 (two) times daily.   levothyroxine 112 MCG tablet Commonly known as: SYNTHROID Take 1 tablet by mouth Monday through Saturday and 1/2 tablet on Sunday. What changed:  how much to take how to take this when to take this   meclizine 25 MG tablet Commonly known as: ANTIVERT Take 1 tablet by mouth daily as needed for dizziness.   multivitamin with minerals Tabs tablet Take 1 tablet by mouth daily. Start taking on: July 11, 2021   ondansetron 4 MG tablet Commonly known as: ZOFRAN Take 4 mg by mouth every 8 (eight) hours as needed for nausea or vomiting.   OVER THE COUNTER MEDICATION Take 1 tablet by mouth daily. Medication: Vitamin b12 3000 mcg   pantoprazole 40 MG tablet Commonly known as: PROTONIX Take 40 mg by mouth 2 (two)  times daily. What changed: Another medication with the same name was removed. Continue taking this medication, and follow the directions you see here.   polyvinyl alcohol 1.4 % ophthalmic solution Commonly known as: LIQUIFILM TEARS Place 1 drop into both eyes as needed for dry eyes.   rosuvastatin 5 MG tablet Commonly known as: CRESTOR Take 1 tablet (5 mg total) by mouth daily.   saccharomyces boulardii 250 MG capsule Commonly known as: FLORASTOR Take 1 capsule (250 mg total) by mouth 2 (two) times daily.       ASK your doctor about these medications    feeding supplement Liqd Take 237 mLs by mouth 2 (two) times daily between meals.   onetouch ultrasoft lancets Use as instructed   OneTouch Verio test strip Generic drug: glucose blood Use as intstructed to check blood sugar once a day Dx Code E11.9       Allergies  Allergen Reactions   Gluten Meal     Celiac    The results of significant diagnostics from this hospitalization (including imaging, microbiology, ancillary and laboratory) are listed below for reference.    Significant Diagnostic Studies: CT Abdomen Pelvis Wo Contrast  Result Date: 07/01/2021 CLINICAL DATA:  Unspecified abdominal pain, abdominal distension EXAM: CT ABDOMEN AND PELVIS WITHOUT CONTRAST TECHNIQUE: Multidetector CT imaging of the abdomen and pelvis was performed following the standard protocol without IV contrast. COMPARISON:  CTA 06/13/2021 FINDINGS: Lower chest: Stable elevation of the left hemidiaphragm. Cardiac size within normal limits. No pericardial effusion. Hepatobiliary: Tiny probable cyst within the right hepatic dome again noted. Liver otherwise unremarkable. Gallbladder unremarkable. No intra or extrahepatic biliary ductal dilation. Pancreas: Unremarkable Spleen: Unremarkable Adrenals/Urinary Tract: Adrenal glands are unremarkable. Kidneys are normal, without renal calculi, focal lesion, or hydronephrosis. Bladder is unremarkable.  Stomach/Bowel: Stomach is within normal limits. Appendix appears normal. No evidence of bowel wall thickening, distention, or inflammatory changes. No free intraperitoneal gas or fluid. Midpelvic percutaneous drainage catheter is unchanged in position with its loop seen just superior to the bladder dome. No associated pericatheter fluid collection identified. Vascular/Lymphatic: Inferior vena cava filter is unchanged in position within the infrarenal cava. Mild aortoiliac atherosclerotic calcification. No aortic aneurysm. No pathologic adenopathy within the abdomen and pelvis. Reproductive: Uterus and bilateral adnexa are unremarkable. Other: Tiny fat containing broad-based umbilical hernia. Rectum unremarkable. Musculoskeletal: No acute bone abnormality. Degenerative changes are seen within the lumbar spine. No lytic or blastic bone lesion. IMPRESSION: No acute intra-abdominal pathology identified. No definite radiographic explanation for the patient's reported symptoms. Redemonstration of a percutaneous drainage catheter within the mid pelvis anteriorly with complete evacuation  of the previously noted pelvic abscess seen on CT examination of 05/26/2021. Mild sigmoid diverticulosis. No significant pericolonic inflammatory change identified. Aortic Atherosclerosis (ICD10-I70.0). Electronically Signed   By: Helyn NumbersAshesh  Parikh MD   On: 07/01/2021 22:19   CT HEAD WO CONTRAST  Result Date: 07/02/2021 CLINICAL DATA:  Acute metabolic encephalopathy.  Lethargy. EXAM: CT HEAD WITHOUT CONTRAST TECHNIQUE: Contiguous axial images were obtained from the base of the skull through the vertex without intravenous contrast. 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 Stable tiny peripheral infarct within the left cerebellar hemisphere. Normal anatomic configuration of the brain. Mild parenchymal volume loss is commensurate with the patient's age. Mild  periventricular white matter changes are present likely reflecting the sequela of small vessel ischemia. No evidence of acute intracranial hemorrhage or infarct. No abnormal mass effect or midline shift. No abnormal intra or extra-axial mass lesion or fluid collection. Ventricular size is normal. Vascular: No hyperdense vessel or unexpected calcification. Skull: Intact Sinuses/Orbits: Interval bilateral ethmoidectomy. There is, additionally, development of remote bilateral medial orbital wall fractures. 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 Resultant increasing proptosis of the ocular globes bilaterally., Other: The mastoid air cells and middle ear cavities are clear. IMPRESSION: No acute intracranial abnormality. 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 Interval remote medial orbital wall fractures bilaterally and ethmoidectomy. Electronically Signed   By: Helyn NumbersAshesh  Parikh MD   On: 07/02/2021 03:00   IR Sinus/Fist Tube Chk-Non GI  Result Date: 07/03/2021 INDICATION: Status post percutaneous catheter drainage of left pelvic diverticular abscess on 06/06/2021. Assessment for possible fistula prior to drain removal. EXAM: INJECTION OF ABSCESS DRAINAGE CATHETER UNDER FLUOROSCOPY MEDICATIONS: No medications were administered. ANESTHESIA/SEDATION: No sedation was administered. CONTRAST:  5 mL Omnipaque 300 FLUOROSCOPY TIME:  6 seconds.  4.0 minutes COMPLICATIONS: None immediate. PROCEDURE: The pre-existing drainage catheter was injected under fluoroscopy. There is preferential outflow of contrast via the tract and onto the skin. No residual abscess cavity is identified. There is no evidence of fistula to bowel. IMPRESSION: No abscess cavity or fistula identified with  contrast injection of the percutaneous drainage catheter. Due to low output, the drainage catheter was removed following the injection procedure today. Electronically Signed   By: Irish LackGlenn  Yamagata M.D.   On: 07/03/2021 16:59   US RENAL  Result Date: 07/02/2021 CLINICAL DATA:  Initial evaluation for renal failure. EXAM: RENAL / URINARY TRACT ULTRASOUND COMPLETE COMPARISON:  CT from 07/01/2021. FINDINGS: Right Kidney: Renal measurements: 10.6 x 4.5 x 4.7 cm = volume: 115.9 mL. Renal echogenicity within normal limits. No nephrolithiasis or hydronephrosis. No focal renal mass. Left Kidney: Renal measurements: 10.0 x 5.8 x 4.9 cm = volume: 148.1 mL. Renal echogenicity within normal limits. No nephrolithiasis or hydronephrosis. No focal renal mass. Bladder: Appears normal for degree of bladder distention. Other: None. IMPRESSION: Normal renal ultrasound. No hydronephrosis or other significant finding. Electronically Signed   By: Rise MuBenjamin  McClintock M.D.   On: 07/02/2021 01:33   DG Chest Port 1 View  Result Date: 07/01/2021 CLINICAL DATA:  Confusion, sepsis, tachycardia EXAM: PORTABLE CHEST 1 VIEW COMPARISON:  05/26/2021 FINDINGS: Single frontal view of the chest demonstrates a stable cardiac silhouette. Chronic prominence of the central pulmonary vasculature. No airspace disease, effusion, or pneumothorax. No acute bony abnormalities. IMPRESSION: 1. Stable chest, no acute  process. Electronically Signed   By: Sharlet Salina M.D.   On: 07/01/2021 16:09   EEG adult  Result Date: 07/03/2021 Charlsie Quest, MD     07/03/2021 10:37 AM Patient Name: Dandrea Medders MRN: [id number] Epilepsy Attending: Charlsie Quest Referring Physician/Provider: Dr Shauna Hugh Date: 07/03/2021 Duration: 24.11 mins Patient history: 70 year old female with altered mental status and seizure-like activity.  EEG to evaluate for seizures. Level of alertness: Awake, asleep AEDs during EEG study: None Technical aspects: This EEG  study was done with scalp electrodes positioned according to the 10-20 International system of electrode placement. Electrical activity was acquired at a sampling rate of 500Hz  and reviewed with a high frequency filter of 70Hz  and a low frequency filter of 1Hz . EEG data were recorded continuously and digitally stored. Description: The posterior dominant rhythm consists of 8-9 Hz activity of moderate voltage (25-35 uV) seen predominantly in posterior head regions, symmetric and reactive to eye opening and eye closing. Sleep was characterized by vertex waves, sleep spindles (12 to 14 Hz), maximal frontocentral region. Single seizure without clinical signs was noted on 07/03/2021 at 0953 arising from left parieto-occipital region, lasting 50 seconds.  Hyperventilation and photic stimulation were not performed.   ABNORMALITY -Seizure without clinical signs, left parieto-occipital region IMPRESSION: This study showed one seizure without clinical signs on 07/03/2021 at 0953 arising from left parieto-occipital region, lasting 50 seconds. Please consider long-term EEG monitoring if concern for ictal-interictal activity persists. Priyanka   Overnight EEG with video  Result Date: 07/06/2021 07/05/2021, MD     07/06/2021  3:30 PM Patient Name: Jenniger Figiel MRN: Charlsie Quest Epilepsy Attending: 07/08/2021 Referring Physician/Provider: Dr [name] Duration: 07/05/2021 0857 to 07/06/2021 0857  Patient history: 70 year old female with altered mental status and seizure-like activity.  EEG to evaluate for seizures.  Level of alertness: Awake, asleep  AEDs during EEG study: LEV  Technical aspects: This EEG study was done with scalp electrodes positioned according to the 10-20 International system of electrode placement. Electrical activity was acquired at a sampling rate of 500Hz  and reviewed with a high frequency filter of 70Hz  and a low frequency filter of 1Hz . EEG data were recorded continuously and  digitally stored.  Description: The posterior dominant rhythm consists of 8-9 Hz activity of moderate voltage (25-35 uV) seen predominantly in posterior head regions, symmetric and reactive to eye opening and eye closing. Sleep was characterized by vertex waves, sleep spindles (12 to 14 Hz), maximal frontocentral region.   Hyperventilation and photic stimulation were not performed.   IMPRESSION: This study is within normal limits. No seizures or epileptiform discharges were seen during the study.  07/07/2021   Microbiology: Recent Results (from the past 240 hour(s))  Blood culture (routine single)     Status: None   Collection Time: 07/01/21 10:30 PM   Specimen: BLOOD  Result Value Ref Range Status   Specimen Description   Final    BLOOD Performed at [facility], [address], [place], Charlsie Quest 07/03/21    Special Requests   Final    BOTTLES DRAWN AEROBIC AND ANAEROBIC Blood Culture adequate volume Performed at [facility], [name], [place], [place] [address]    Culture   Final    NO GROWTH 5 DAYS Performed at [facility], [address], [place], [place]    Report Status 07/07/2021 FINAL  Final  SARS CORONAVIRUS 2 (TAT  6-24 HRS) Nasopharyngeal Nasopharyngeal Swab     Status: None   Collection Time: 07/01/21 10:50 PM   Specimen: Nasopharyngeal Swab  Result Value Ref Range Status   SARS Coronavirus 2 NEGATIVE NEGATIVE Final    Comment: (NOTE) SARS-CoV-2 target nucleic acids are NOT DETECTED.  The SARS-CoV-2 RNA is generally detectable in upper and lower respiratory specimens during the acute phase of infection. Negative results do not preclude SARS-CoV-2 infection, do not rule out co-infections with other pathogens, and should not be used as the sole basis for treatment or other patient management decisions. Negative results must be combined with clinical observations, patient history, and epidemiological  information. The expected result is Negative.  Fact Sheet for Patients: HairSlick.no  Fact Sheet for Healthcare Providers: quierodirigir.com  This test is not yet approved or cleared by the [place] FDA and  has been authorized for detection and/or diagnosis of SARS-CoV-2 by FDA under an Emergency Use Authorization (EUA). This EUA will remain  in effect (meaning this test can be used) for the duration of the COVID-19 declaration under Se ction 564(b)(1) of the Act, 21 U.S.C. section 360bbb-3(b)(1), unless the authorization is terminated or revoked sooner.  Performed at [facility], [address], [place], [place] [address]   MRSA Next Gen by PCR, Nasal     Status: None   Collection Time: 07/02/21 11:03 AM   Specimen: Nasal Mucosa; Nasal Swab  Result Value Ref Range Status   MRSA by PCR Next Gen NOT DETECTED NOT DETECTED Final    Comment: (NOTE) The GeneXpert MRSA Assay (FDA approved for NASAL specimens only), is one component of a comprehensive MRSA colonization surveillance program. It is not intended to diagnose MRSA infection nor to guide or monitor treatment for MRSA infections. Test performance is not FDA approved in patients less than 3 years old. Performed at [facility], [address], [place], [place] [address]   C Difficile Quick Screen w PCR reflex     Status: None   Collection Time: 07/03/21 10:15 AM   Specimen: STOOL  Result Value Ref Range Status   C Diff antigen NEGATIVE NEGATIVE Final   C Diff toxin NEGATIVE NEGATIVE Final   C Diff interpretation No C. difficile detected.  Final    Comment: Performed at [facility], [address] [place], [place] [address]     Labs: Basic Metabolic Panel: Recent Labs  Lab 07/04/21 1037 07/05/21 0337 07/06/21 0050 07/07/21 0611 07/10/21 0908  NA 137 135 135 137 133*  K 4.0 4.2 4.6 4.3 3.7  CL 107 108 108  110 108  CO2 23 21* 22 21* 20*  GLUCOSE 115* 97 81 91 170*  BUN 10 7* 8 7* 7*  CREATININE 1.87* 1.74* 1.55* 1.48* 1.21*  CALCIUM 7.8* 7.9* 8.1* 8.3* 8.0*  MG 1.9 1.6* 1.7  --   --   PHOS 2.6 2.4*  --   --   --    Liver Function Tests: Recent Labs  Lab 07/04/21 1037 07/05/21 0337  AST 45*  --   ALT 21  --   ALKPHOS 63  --   BILITOT 0.7  --   PROT 5.1*  --   ALBUMIN 2.5* 2.0*    CBC: Recent Labs  Lab 07/04/21 1037 07/05/21 0337  WBC 9.6 8.1  NEUTROABS 6.8  --   HGB 9.2* 8.0*  HCT 29.7* 25.4*  MCV 100.7* 99.2  PLT 204 186   Cardiac Enzymes: Recent  Labs  Lab 07/05/21 0337  CKTOTAL 38   CBG: Recent Labs  Lab 07/03/21 1709 07/03/21 2110  GLUCAP 86 122*    Principal Problem:   AKI (acute kidney injury) (HCC) Active Problems:   Seizure (HCC)   Type 2 diabetes mellitus with stage 3a chronic kidney disease, without long-term current use of insulin (HCC)   Chronic diastolic CHF (congestive heart failure) (HCC)   Acute metabolic encephalopathy   Essential hypertension   Hypothyroidism   Diarrhea   Pressure injury of skin   Time coordinating discharge: 35 minutes  Signed:  Brendia Sacks, MD  [facility]  07/10/2021, 1:17 PM

## 2021-07-10 NOTE — Plan of Care (Signed)
  Problem: Education: Goal: Knowledge of General Education information will improve Description: Including pain rating scale, medication(s)/side effects and non-pharmacologic comfort measures 07/10/2021 1337 by Mamie Nick I, RN Outcome: Progressing 07/10/2021 1247 by Mamie Nick I, RN Outcome: Progressing   Problem: Health Behavior/Discharge Planning: Goal: Ability to manage health-related needs will improve 07/10/2021 1337 by Mamie Nick I, RN Outcome: Progressing 07/10/2021 1247 by Mamie Nick I, RN Outcome: Progressing   Problem: Clinical Measurements: Goal: Ability to maintain clinical measurements within normal limits will improve 07/10/2021 1337 by Mamie Nick I, RN Outcome: Progressing 07/10/2021 1247 by Mamie Nick I, RN Outcome: Progressing Goal: Will remain free from infection 07/10/2021 1337 by Mamie Nick I, RN Outcome: Progressing 07/10/2021 1247 by Mamie Nick I, RN Outcome: Progressing Goal: Diagnostic test results will improve 07/10/2021 1337 by Mamie Nick I, RN Outcome: Progressing 07/10/2021 1247 by Mamie Nick I, RN Outcome: Progressing Goal: Respiratory complications will improve 07/10/2021 1337 by Mamie Nick I, RN Outcome: Progressing 07/10/2021 1247 by Mamie Nick I, RN Outcome: Progressing Goal: Cardiovascular complication will be avoided 07/10/2021 1337 by Mamie Nick I, RN Outcome: Progressing 07/10/2021 1247 by Mamie Nick I, RN Outcome: Progressing   Problem: Activity: Goal: Risk for activity intolerance will decrease 07/10/2021 1337 by Mamie Nick I, RN Outcome: Progressing 07/10/2021 1247 by Mamie Nick I, RN Outcome: Progressing   Problem: Nutrition: Goal: Adequate nutrition will be maintained 07/10/2021 1337 by Mamie Nick I, RN Outcome: Progressing 07/10/2021 1247 by Mamie Nick I, RN Outcome: Progressing   Problem: Coping: Goal: Level  of anxiety will decrease 07/10/2021 1337 by Mamie Nick I, RN Outcome: Progressing 07/10/2021 1247 by Mamie Nick I, RN Outcome: Progressing   Problem: Elimination: Goal: Will not experience complications related to bowel motility 07/10/2021 1337 by Mamie Nick I, RN Outcome: Progressing 07/10/2021 1247 by Mamie Nick I, RN Outcome: Progressing Goal: Will not experience complications related to urinary retention 07/10/2021 1337 by Mamie Nick I, RN Outcome: Progressing 07/10/2021 1247 by Mamie Nick I, RN Outcome: Progressing   Problem: Pain Managment: Goal: General experience of comfort will improve 07/10/2021 1337 by Mamie Nick I, RN Outcome: Progressing 07/10/2021 1247 by Mamie Nick I, RN Outcome: Progressing   Problem: Safety: Goal: Ability to remain free from injury will improve 07/10/2021 1337 by Mamie Nick I, RN Outcome: Progressing 07/10/2021 1247 by Mamie Nick I, RN Outcome: Progressing   Problem: Skin Integrity: Goal: Risk for impaired skin integrity will decrease 07/10/2021 1337 by Mamie Nick I, RN Outcome: Progressing 07/10/2021 1247 by Mamie Nick I, RN Outcome: Progressing

## 2021-07-11 DIAGNOSIS — E039 Hypothyroidism, unspecified: Secondary | ICD-10-CM | POA: Diagnosis not present

## 2021-07-11 DIAGNOSIS — N179 Acute kidney failure, unspecified: Secondary | ICD-10-CM | POA: Diagnosis not present

## 2021-07-11 DIAGNOSIS — N1831 Chronic kidney disease, stage 3a: Secondary | ICD-10-CM | POA: Diagnosis not present

## 2021-07-11 DIAGNOSIS — E1122 Type 2 diabetes mellitus with diabetic chronic kidney disease: Secondary | ICD-10-CM | POA: Diagnosis not present

## 2021-07-11 NOTE — Plan of Care (Signed)

## 2021-07-11 NOTE — Progress Notes (Signed)
Physical Therapy Treatment Patient Details Name: [name] MRN: [id number] DOB: [birth year] Today's Date: 07/11/2021    History of Present Illness 70 y/o female presented to [facility] ED on 7/16 for confusion and a period of "twitching" at rehab facility. EEG on 7/18 revealed one seizure. Trasnferred to [facility]. Long term EEG on 7/20 revealed no seizures. Recently d/c'ed to SNF after recent admission 6/10-7/8 for perforated diveriticulitis with abscess. PMH: anemia, CHF, DM, graves disease, HTN, obesity, stroke, seizure, and thyroid disease    PT Comments    Pt showing good motivation to get out of bed and mobilize today. She thought she could ambulate to the door of her room and was surprised to find that she needed to sit after 5' due to B knee pain and weakness. Performed sit>stand multiple times for strengthening as well as standing exercises and pregait activities. PT will continue to follow.    Follow Up Recommendations  SNF     Equipment Recommendations  None recommended by PT    Recommendations for Other Services       Precautions / Restrictions Precautions Precautions: Fall Precaution Comments: monitor HR, seizure precaution Restrictions Weight Bearing Restrictions: No    Mobility  Bed Mobility Overal bed mobility: Needs Assistance Bed Mobility: Supine to Sit     Supine to sit: Min assist     General bed mobility comments: pt able to initiate transfer to EOB but needed min A to bring hips to EOB    Transfers Overall transfer level: Needs assistance Equipment used: Rolling walker (2 wheeled) Transfers: Sit to/from Stand Sit to Stand: Min assist         General transfer comment: performed multiple times for strengthening, min A needed each time for fwd translation, from bed and recliner  Ambulation/Gait Ambulation/Gait assistance: Min assist;+2 safety/equipment Gait Distance (Feet): 5 Feet Assistive device: Rolling walker (2 wheeled) Gait  Pattern/deviations: Step-to pattern;Decreased stride length;Decreased weight shift to right;Decreased weight shift to left;Trunk flexed;Wide base of support Gait velocity: decreased Gait velocity interpretation: <1.31 ft/sec, indicative of household ambulator General Gait Details: pt thought she could ambulate to the door of the room but once up, experienced B knee pain and weakness and went 5' and needed to sit in chair. Performed pregait activities from recliner after this   Social research officer, government Rankin (Stroke Patients Only)       Balance Overall balance assessment: Needs assistance Sitting-balance support: No upper extremity supported;Feet supported Sitting balance-Leahy Scale: Good     Standing balance support: Bilateral upper extremity supported Standing balance-Leahy Scale: Poor Standing balance comment: reliant on UE support                            Cognition Arousal/Alertness: Awake/alert Behavior During Therapy: WFL for tasks assessed/performed Overall Cognitive Status: Within Functional Limits for tasks assessed                                        Exercises General Exercises - Lower Extremity Ankle Circles/Pumps: AROM;Both;20 reps;Seated Hip Flexion/Marching: Both;10 reps;AROM;Standing Other Exercises Other Exercises: wt shifting in standing x10 Other Exercises: shoulder shrugs in standing x10    General Comments General comments (skin integrity, edema, etc.): VSS on RA      Pertinent Vitals/Pain Pain  Assessment: Faces Faces Pain Scale: Hurts a little bit Pain Location: bilateral knees Pain Descriptors / Indicators: Discomfort Pain Intervention(s): Monitored during session;Limited activity within patient's tolerance    Home Living                      Prior Function            PT Goals (current goals can now be found in the care plan section) Acute Rehab PT Goals Patient  Stated Goal: to get better PT Goal Formulation: With patient Time For Goal Achievement: 07/18/21 Potential to Achieve Goals: Fair Progress towards PT goals: Progressing toward goals    Frequency    Min 3X/week      PT Plan Current plan remains appropriate    [name]-evaluation              AM-PAC PT "6 Clicks" Mobility   Outcome Measure  Help needed turning from your back to your side while in a flat bed without using bedrails?: A Little Help needed moving from lying on your back to sitting on the side of a flat bed without using bedrails?: A Little Help needed moving to and from a bed to a chair (including a wheelchair)?: A Little Help needed standing up from a chair using your arms (e.g., wheelchair or bedside chair)?: A Little Help needed to walk in hospital room?: A Lot Help needed climbing 3-5 steps with a railing? : Total 6 Click Score: 15    End of Session Equipment Utilized During Treatment: Gait belt Activity Tolerance: Patient tolerated treatment well Patient left: with call bell/phone within reach;in chair;with chair alarm set Nurse Communication: Mobility status PT Visit Diagnosis: Muscle weakness (generalized) (M62.81);Difficulty in walking, not elsewhere classified (R26.2);Unsteadiness on feet (R26.81);Other abnormalities of gait and mobility (R26.89) Pain - part of body: Knee     Time: 0921-0941 PT Time Calculation (min) (ACUTE ONLY): 20 min  Charges:  $Gait Training: 8-22 mins                     [name], PT  Acute Rehab Services  Pager [phone] Office [phone]    [name] [name] 07/11/2021, 11:05 AM

## 2021-07-11 NOTE — Assessment & Plan Note (Signed)
-  Continue Boost Breeze po TID, each supplement provides 250 kcal and 9 grams of protein  -30 ml Prosource Plus TID, each supplement provides 100 kcals and 15 grams protein -MVI with minerals daily

## 2021-07-11 NOTE — Plan of Care (Signed)
Pt has been alert oriented x 4, but has been feeling sleepy. Pt has been turned in bed. Denies pain. No seizure activity noted.    Problem: Education: Goal: Knowledge of General Education information will improve Description: Including pain rating scale, medication(s)/side effects and non-pharmacologic comfort measures Outcome: Progressing   Problem: Health Behavior/Discharge Planning: Goal: Ability to manage health-related needs will improve Outcome: Progressing   Problem: Clinical Measurements: Goal: Ability to maintain clinical measurements within normal limits will improve Outcome: Progressing Goal: Will remain free from infection Outcome: Progressing Goal: Diagnostic test results will improve Outcome: Progressing Goal: Respiratory complications will improve Outcome: Progressing Goal: Cardiovascular complication will be avoided Outcome: Progressing   Problem: Nutrition: Goal: Adequate nutrition will be maintained Outcome: Progressing   Problem: Activity: Goal: Risk for activity intolerance will decrease Outcome: Progressing   Problem: Coping: Goal: Level of anxiety will decrease Outcome: Progressing   Problem: Elimination: Goal: Will not experience complications related to bowel motility Outcome: Progressing Goal: Will not experience complications related to urinary retention Outcome: Progressing   Problem: Pain Managment: Goal: General experience of comfort will improve Outcome: Progressing   Problem: Safety: Goal: Ability to remain free from injury will improve Outcome: Progressing   Problem: Skin Integrity: Goal: Risk for impaired skin integrity will decrease Outcome: Progressing

## 2021-07-11 NOTE — Plan of Care (Signed)
Patient is awake and alert oriented x 4. Pt was sitting in chair, pt assisted back to bed via sara steady. Head of the bed elevated. Pt refused to place SCDs on at this time.   Problem: Education: Goal: Knowledge of General Education information will improve Description: Including pain rating scale, medication(s)/side effects and non-pharmacologic comfort measures 07/11/2021 2217 by Vergie Living, RN Outcome: Progressing 07/11/2021 0830 by Vergie Living, RN Outcome: Progressing   Problem: Health Behavior/Discharge Planning: Goal: Ability to manage health-related needs will improve 07/11/2021 2217 by Vergie Living, RN Outcome: Progressing 07/11/2021 0830 by Vergie Living, RN Outcome: Progressing   Problem: Clinical Measurements: Goal: Ability to maintain clinical measurements within normal limits will improve 07/11/2021 2217 by Vergie Living, RN Outcome: Progressing 07/11/2021 0830 by Vergie Living, RN Outcome: Progressing Goal: Will remain free from infection 07/11/2021 2217 by Vergie Living, RN Outcome: Progressing 07/11/2021 0830 by Vergie Living, RN Outcome: Progressing Goal: Diagnostic test results will improve 07/11/2021 2217 by Vergie Living, RN Outcome: Progressing 07/11/2021 0830 by Vergie Living, RN Outcome: Progressing Goal: Respiratory complications will improve 07/11/2021 2217 by Vergie Living, RN Outcome: Progressing 07/11/2021 0830 by Vergie Living, RN Outcome: Progressing Goal: Cardiovascular complication will be avoided 07/11/2021 2217 by Vergie Living, RN Outcome: Progressing 07/11/2021 0830 by Vergie Living, RN Outcome: Progressing   Problem: Activity: Goal: Risk for activity intolerance will decrease 07/11/2021 2217 by Vergie Living, RN Outcome: Progressing 07/11/2021 0830 by Vergie Living, RN Outcome: Progressing   Problem: Nutrition: Goal: Adequate nutrition will be  maintained 07/11/2021 2217 by Vergie Living, RN Outcome: Progressing 07/11/2021 0830 by Vergie Living, RN Outcome: Progressing   Problem: Elimination: Goal: Will not experience complications related to bowel motility 07/11/2021 2217 by Vergie Living, RN Outcome: Progressing 07/11/2021 0830 by Vergie Living, RN Outcome: Progressing Goal: Will not experience complications related to urinary retention 07/11/2021 2217 by Vergie Living, RN Outcome: Progressing 07/11/2021 0830 by Vergie Living, RN Outcome: Progressing   Problem: Pain Managment: Goal: General experience of comfort will improve 07/11/2021 2217 by Vergie Living, RN Outcome: Progressing 07/11/2021 0830 by Vergie Living, RN Outcome: Progressing   Problem: Safety: Goal: Ability to remain free from injury will improve 07/11/2021 2217 by Vergie Living, RN Outcome: Progressing 07/11/2021 0830 by Vergie Living, RN Outcome: Progressing   Problem: Skin Integrity: Goal: Risk for impaired skin integrity will decrease 07/11/2021 2217 by Vergie Living, RN Outcome: Progressing 07/11/2021 0830 by Vergie Living, RN Outcome: Progressing

## 2021-07-11 NOTE — Care Management Important Message (Signed)
Important Message  Patient Details  Name: [name] MRN: [id number] Date of Birth: [birth year]   Medicare Important Message Given:  Yes     Damante Spragg P Gita Dilger 07/11/2021, 12:54 PM

## 2021-07-11 NOTE — Progress Notes (Addendum)
PROGRESS NOTE  [name] ZWC:[id number] DOB: January 27, 1951 DOA: 07/01/2021 PCP: Ollen Bowl, MD  Brief History   70 year old woman presented with lethargy, generalized weakness, diarrhea, poor oral intake, shaking episode admitted for AKI, acute metabolic encephalopathy, possible seizure.  EEG showed subclinical seizure, transferred to [facility] for long-term video monitoring and neurology evaluation.  Continuous EEG was unrevealing.  Neurology recommended continuing Keppra, no driving, follow-up with neurology as an outpatient.  Acute kidney injury improving, expect spontaneous resolution at this point.  Medically stable for transfer to skilled nursing facility.  Authorization in process.   A & P  * AKI (acute kidney injury) (HCC) -- In context of poor oral intake and diarrhea, outpatient Lasix.  CT abdomen pelvis and renal ultrasound without significant findings. -- Essentially resolved.  Can resume Lasix on discharge.  Seizure (HCC) -- seen on spot EEG.  Started on Keppra.  Long-term EEG unrevealing.  Continue Keppra on discharge, follow-up with neurology as an outpatient.  Seizure precautions, no driving.  Type 2 diabetes mellitus with stage 3a chronic kidney disease, without long-term current use of insulin (HCC) -- CBG stable, no need to monitor, no need for sliding scale insulin. -- Stop metformin on discharge.  Acute metabolic encephalopathy -- Probably multifactorial including acute kidney injury, seizure. -- Resolved.  Chronic diastolic CHF (congestive heart failure) (HCC) -- LVEF 65-70%, indeterminant diastolic function.  Mild pedal edema bilaterally.  Can restart Lasix on discharge.  Diarrhea -- Improved.  C. difficile negative.  Continue Imodium.  Present prior to admission, may be related to recent bout with diverticulitis, also consider metformin. She also reports history of celiac disease, concern for dietary noncompliance at facility    Hypothyroidism --continue levothyroxine --TSH 5.312 down for 21.7  --follow-up as an outpatient  Obesity, Class III, BMI 40-49.9 (morbid obesity) (HCC) -Continue Boost Breeze po TID, each supplement provides 250 kcal and 9 grams of protein  -30 ml Prosource Plus TID, each supplement provides 100 kcals and 15 grams protein -MVI with minerals daily  Skin Assessment: Pressure Injury 07/02/21 Buttocks Left Stage 2 -  Partial thickness loss of dermis presenting as a shallow open injury with a red, pink wound bed without slough. (Active)  07/02/21 0923  Location: Buttocks  Location Orientation: Left  Staging: Stage 2 -  Partial thickness loss of dermis presenting as a shallow open injury with a red, pink wound bed without slough.  Wound Description (Comments):   Present on Admission: Yes   Disposition Plan:  Discussion: Remains stable for transfer to SNF.  Status is: Inpatient  Remains inpatient appropriate because:needs SNF  Dispo: The patient is from: SNF              Anticipated d/c is to: SNF              Patient currently is medically stable to d/c.   Difficult to place patient No  DVT prophylaxis: SCDs Start: 07/02/21 0029   Code Status: Full Code Level of care: Med-Surg Family Communication:    Brendia Sacks, MD  [facility] Direct contact: see [email or web address] (further directions at bottom of note if needed) 7PM-7AM contact night coverage as at bottom of note 07/11/2021, 4:50 PM  LOS: 10 days    Consults:  Neurology    Significant Diagnostic Tests:  EEG   Interval History/Subjective  CC: f/u seizure  Feels fine, but appetite is poor  Objective   Vitals:  Vitals:   07/11/21 0900 07/11/21 1146  BP:  116/77  Pulse:  92  Resp:  18  Temp:  (!) 97.5 F (36.4 C)  SpO2: 97% 100%    Exam: Physical Exam Vitals reviewed.  Constitutional:      Appearance: Normal appearance.     Comments: Sitting in chair  Cardiovascular:     Rate and Rhythm: Normal  rate and regular rhythm.     Heart sounds: No murmur heard. Pulmonary:     Effort: Pulmonary effort is normal. No respiratory distress.     Breath sounds: Normal breath sounds. No wheezing, rhonchi or rales.  Neurological:     Mental Status: She is alert.  Psychiatric:        Mood and Affect: Mood normal.        Behavior: Behavior normal.    I have personally reviewed the labs and other data, making special note of:   Today's Data  COVID negative  Scheduled Meds:  (feeding supplement) PROSource Plus  30 mL Oral TID BM   vitamin C  250 mg Oral BID   feeding supplement  1 Container Oral TID BM   fluticasone furoate-vilanterol  1 puff Inhalation Daily   levETIRAcetam  500 mg Oral BID   levothyroxine  112 mcg Oral Once per day on Mon Tue Wed Thu Fri Sat   And   levothyroxine  56 mcg Oral Once per day on Sun   multivitamin with minerals  1 tablet Oral Daily   pantoprazole  40 mg Oral BID   Continuous Infusions:   Principal Problem:   AKI (acute kidney injury) (HCC) Active Problems:   Seizure (HCC)   Type 2 diabetes mellitus with stage 3a chronic kidney disease, without long-term current use of insulin (HCC)   Chronic diastolic CHF (congestive heart failure) (HCC)   Acute metabolic encephalopathy   Obesity, Class III, BMI 40-49.9 (morbid obesity) (HCC)   Essential hypertension   Hypothyroidism   Diarrhea   Pressure injury of skin   LOS: 10 days   How to contact the [facility] Attending or Consulting provider 7A - 7P or covering provider during after hours 7P -7A, for this patient?  Check the care team in [facility] and look for a) attending/consulting TRH provider listed and b) the [facility] team listed Log into [email or web address] and use [place]'s universal password to access. If you do not have the password, please contact the hospital operator. Locate the [facility] provider you are looking for under [facility] and page to a number that you can be directly reached. If you still have  difficulty reaching the provider, please page the [facility] (Director on Call) for the Hospitalists listed on amion for assistance.

## 2021-07-12 DIAGNOSIS — Z743 Need for continuous supervision: Secondary | ICD-10-CM | POA: Diagnosis not present

## 2021-07-12 DIAGNOSIS — R29898 Other symptoms and signs involving the musculoskeletal system: Secondary | ICD-10-CM | POA: Diagnosis not present

## 2021-07-12 DIAGNOSIS — N179 Acute kidney failure, unspecified: Secondary | ICD-10-CM | POA: Diagnosis not present

## 2021-07-12 NOTE — TOC Transition Note (Signed)
Transition of Care [facility]) - CM/SW Discharge Note   Patient Details  Name: [name] MRN: [id number] Date of Birth: 06-05-1951  [facility] CM/SW Contact:  Kermit Balo, RN Phone Number: 07/12/2021, 10:54 AM   Clinical Narrative:    Pt has received insurance approval to return to [facility] for rehab. [facility] will provide transport. Bedside RN updated and d/c packet is at the desk.   Room: 108 Number for report: [phone]   Final next level of care: Skilled Nursing Facility Barriers to Discharge: No Barriers Identified   Patient Goals and CMS Choice Patient states their goals for this hospitalization and ongoing recovery are:: To get better CMS Medicare.gov Compare Post Acute Care list provided to:: Patient Choice offered to / list presented to : Patient, Adult Children  Discharge Placement              Patient chooses bed at:  ([facility]) Patient to be transferred to facility by: [facility] Name of family member notified: Whitney--daughter Patient and family notified of of transfer: 07/12/21  Discharge Plan and Services   Discharge Planning Services: CM Consult                                 Social Determinants of Health (SDOH) Interventions     Readmission Risk Interventions No flowsheet data found.

## 2021-07-12 NOTE — Consult Note (Signed)
   [facility] CM Inpatient Consult   07/12/2021  Keirstan Iannello 1951-07-03 [id number]  Follow up:  [employer]  LLOS: 11 days  Patient to return to SNF at this time. No [facility] CM needs at this time.  For questions,  Charlesetta Shanks, RN BSN CCM [facility]  [phone] business mobile phone Toll free office [phone]  Fax number: [phone] [email or web address]

## 2021-07-12 NOTE — Discharge Summary (Signed)
Physician Discharge Summary  [name] ZOX:[id number] DOB: 09-17-1951 DOA: 07/01/2021  PCP: Ollen Bowl, MD  Admit date: 07/01/2021 Discharge date: 07/12/2021 30 Day Unplanned Readmission Risk Score    Flowsheet Row ED to Hosp-Admission (Current) from 07/01/2021 in [facility]  30 Day Unplanned Readmission Risk Score (%) 33.42 Filed at 07/12/2021 0801       This score is the patient's risk of an unplanned readmission within 30 days of being discharged (0 -100%). The score is based on dignosis, age, lab data, medications, orders, and past utilization.   Low:  0-14.9   Medium: 15-21.9   High: 22-29.9   Extreme: 30 and above          Admitted From: Home Disposition: SNF  Recommendations for Outpatient Follow-up:  Follow up with PCP in 1-2 weeks Follow-up with neurology in 4 weeks. Please obtain BMP/CBC in one week Please follow up with your PCP on the following pending results: Unresulted Labs (From admission, onward)    None     If patient has another seizure, call 911 and bring them back to the ED if: A.  The seizure lasts longer than 5 minutes.      B.  The patient doesn't wake shortly after the seizure or has new problems such as difficulty seeing, speaking or moving following the seizure C.  The patient was injured during the seizure D.  The patient has a temperature over 102 F (39C) E.  The patient vomited during the seizure and now is having trouble breathing    During the Seizure   - First, ensure adequate ventilation and place patients on the floor on their left side  Loosen clothing around the neck and ensure the airway is patent. If the patient is clenching the teeth, do not force the mouth open with any object as this can cause severe damage - Remove all items from the surrounding that can be hazardous. The patient may be oblivious to what's happening and may not even know what he or she is doing. If the patient is confused and  wandering, either gently guide him/her away and block access to outside areas - Reassure the individual and be comforting - Call 911. In most cases, the seizure ends before EMS arrives. However, there are cases when seizures may last over 3 to 5 minutes. Or the individual may have developed breathing difficulties or severe injuries. If a pregnant patient or a person with diabetes develops a seizure, it is prudent to call an ambulance. - Finally, if the patient does not regain full consciousness, then call EMS. Most patients will remain confused for about 45 to 90 minutes after a seizure, so you must use judgment in calling for help. - Avoid restraints but make sure the patient is in a bed with padded side rails - Place the individual in a lateral position with the neck slightly flexed; this will help the saliva drain from the mouth and prevent the tongue from falling backward - Remove all nearby furniture and other hazards from the area - Provide verbal assurance as the individual is regaining consciousness - Provide the patient with privacy if possible - Call for help and start treatment as ordered by the caregiver    After the Seizure (Postictal Stage)   After a seizure, most patients experience confusion, fatigue, muscle pain and/or a headache. Thus, one should permit the individual to sleep. For the next few days, reassurance is essential. Being calm and  helping reorient the person is also of importance.   Most seizures are painless and end spontaneously. Seizures are not harmful to others but can lead to complications such as stress on the lungs, brain and the heart. Individuals with prior lung problems may develop labored breathing and respiratory distress.  -Per [facility] statutes, patients with seizures are not allowed to drive until they have been seizure-free for six months. Use caution when using heavy equipment or power tools. Avoid working on ladders or at heights. Take showers  instead of baths. Ensure the water temperature is not too high on the home water heater. Do not go swimming alone. Do not lock yourself in a room alone (i.e. bathroom). When caring for infants or small children, sit down when holding, feeding, or changing them to minimize risk of injury to the child in the event you have a seizure. Maintain good sleep hygiene. Avoid alcohol.   Home Health: None Equipment/Devices: None  Discharge Condition: Stable CODE STATUS: Full code Diet recommendation: Renal  Subjective: Seen and examined.  She has no complaints.  Brief/Interim Summary: 70 year old woman presented with lethargy, generalized weakness, diarrhea, poor oral intake, shaking episode admitted for AKI, acute metabolic encephalopathy, possible seizure.  EEG showed subclinical seizure, transferred to [facility] for long-term video monitoring and neurology evaluation.  Continuous EEG was unrevealing.  Neurology recommended continuing Keppra, no driving, follow-up with neurology as an outpatient.  Acute kidney injury improving.  She was seen by PT OT and they recommended SNF.     Type 2 diabetes mellitus with stage 3a chronic kidney disease, without long-term current use of insulin (HCC) -- CBG stable, no need to monitor, no need for sliding scale insulin. -- Stop metformin on discharge.   Acute metabolic encephalopathy -- Probably multifactorial including acute kidney injury, seizure. -- Resolved.   Chronic diastolic CHF (congestive heart failure) (HCC) -- LVEF 65-70%, indeterminant diastolic function.  Mild pedal edema bilaterally.  Can restart Lasix on discharge.   Diarrhea -- Improved.  C. difficile negative.  Continue Imodium.  Present prior to admission, may be related to recent bout with diverticulitis, also consider metformin. She also reports history of celiac disease, concern for dietary noncompliance at facility    Hypothyroidism --continue levothyroxine   Obesity, Class III, BMI  40-49.9 (morbid obesity) (HCC) -Continue Boost Breeze po TID, each supplement provides 250 kcal and 9 grams of protein  -30 ml Prosource Plus TID, each supplement provides 100 kcals and 15 grams protein -MVI with minerals daily   Skin Assessment: Pressure Injury 07/02/21 Buttocks Left Stage 2 -  Partial thickness loss of dermis presenting as a shallow open injury with a red, pink wound bed without slough. (Active) 07/02/21 0923 Location: Buttocks Location Orientation: Left Staging: Stage 2 -  Partial thickness loss of dermis presenting as a shallow open injury with a red, pink wound bed without slough. Wound Description (Comments): Present on Admission: Yes   Discharge Diagnoses:  Principal Problem:   AKI (acute kidney injury) (HCC) Active Problems:   Chronic diastolic CHF (congestive heart failure) (HCC)   Obesity, Class III, BMI 40-49.9 (morbid obesity) (HCC)   Type 2 diabetes mellitus with stage 3a chronic kidney disease, without long-term current use of insulin (HCC)   Seizure (HCC)   Essential hypertension   Hypothyroidism   Acute metabolic encephalopathy   Diarrhea   Pressure injury of skin    Discharge Instructions  Discharge Instructions     Ambulatory referral to Neurology  Complete by: As directed    An appointment is requested in approximately: 4 weeks      Allergies as of 07/12/2021       Reactions   Gluten Meal    Celiac        Medication List     STOP taking these medications    levofloxacin 750 MG tablet Commonly known as: LEVAQUIN   metFORMIN 500 MG tablet Commonly known as: GLUCOPHAGE   mirtazapine 7.5 MG tablet Commonly known as: REMERON       TAKE these medications    acetaminophen 325 MG tablet Commonly known as: TYLENOL Take 650 mg by mouth every 4 (four) hours as needed for fever.   albuterol 108 (90 Base) MCG/ACT inhaler Commonly known as: VENTOLIN HFA Inhale 2 puffs into the lungs every 6 (six) hours as needed for  wheezing or shortness of breath.   Fluticasone-Salmeterol 250-50 MCG/DOSE Aepb Commonly known as: ADVAIR Inhale 1 puff into the lungs daily as needed for allergies.   furosemide 20 MG tablet Commonly known as: LASIX Take 1 tablet (20 mg total) by mouth daily.   levETIRAcetam 500 MG tablet Commonly known as: KEPPRA Take 1 tablet (500 mg total) by mouth 2 (two) times daily.   levothyroxine 112 MCG tablet Commonly known as: SYNTHROID Take 1 tablet by mouth Monday through Saturday and 1/2 tablet on Sunday. What changed:  how much to take how to take this when to take this   meclizine 25 MG tablet Commonly known as: ANTIVERT Take 1 tablet by mouth daily as needed for dizziness.   multivitamin with minerals Tabs tablet Take 1 tablet by mouth daily.   ondansetron 4 MG tablet Commonly known as: ZOFRAN Take 4 mg by mouth every 8 (eight) hours as needed for nausea or vomiting.   OVER THE COUNTER MEDICATION Take 1 tablet by mouth daily. Medication: Vitamin b12 3000 mcg   pantoprazole 40 MG tablet Commonly known as: PROTONIX Take 40 mg by mouth 2 (two) times daily. What changed: Another medication with the same name was removed. Continue taking this medication, and follow the directions you see here.   polyvinyl alcohol 1.4 % ophthalmic solution Commonly known as: LIQUIFILM TEARS Place 1 drop into both eyes as needed for dry eyes.   rosuvastatin 5 MG tablet Commonly known as: CRESTOR Take 1 tablet (5 mg total) by mouth daily.   saccharomyces boulardii 250 MG capsule Commonly known as: FLORASTOR Take 1 capsule (250 mg total) by mouth 2 (two) times daily.       ASK your doctor about these medications    feeding supplement Liqd Take 237 mLs by mouth 2 (two) times daily between meals.   onetouch ultrasoft lancets Use as instructed   OneTouch Verio test strip Generic drug: glucose blood Use as intstructed to check blood sugar once a day Dx Code E11.9         Follow-up Information     Khelani Kops, Rinka R, MD. Schedule an appointment as soon as possible for a visit in 3 week(s).   Specialty: Internal Medicine Contact information: [address] [place] [address] [phone]         Nahser, Deloris Ping, MD .   Specialty: Cardiology Contact information: [address] [place] [address] [phone]         Ollen Bowl, MD .   Specialty: Internal Medicine Contact information: [address] [place] [address] [phone]  Nahser, Deloris PingPhilip J, MD .   Specialty: Cardiology Contact information: [name] [place] [address] [phone]                Allergies  Allergen Reactions   Gluten Meal     Celiac    Consultations: Neurology   Procedures/Studies: CT Abdomen Pelvis Wo Contrast  Result Date: 07/01/2021 CLINICAL DATA:  Unspecified abdominal pain, abdominal distension EXAM: CT ABDOMEN AND PELVIS WITHOUT CONTRAST TECHNIQUE: Multidetector CT imaging of the abdomen and pelvis was performed following the standard protocol without IV contrast. COMPARISON:  CTA 06/13/2021 FINDINGS: Lower chest: Stable elevation of the left hemidiaphragm. Cardiac size within normal limits. No pericardial effusion. Hepatobiliary: Tiny probable cyst within the right hepatic dome again noted. Liver otherwise unremarkable. Gallbladder unremarkable. No intra or extrahepatic biliary ductal dilation. Pancreas: Unremarkable Spleen: Unremarkable Adrenals/Urinary Tract: Adrenal glands are unremarkable. Kidneys are normal, without renal calculi, focal lesion, or hydronephrosis. Bladder is unremarkable. Stomach/Bowel: Stomach is within normal limits. Appendix appears normal. No evidence of bowel wall thickening, distention, or inflammatory changes. No free intraperitoneal gas or fluid. Midpelvic percutaneous drainage catheter is unchanged in position with its loop seen just superior to  the bladder dome. No associated pericatheter fluid collection identified. Vascular/Lymphatic: Inferior vena cava filter is unchanged in position within the infrarenal cava. Mild aortoiliac atherosclerotic calcification. No aortic aneurysm. No pathologic adenopathy within the abdomen and pelvis. Reproductive: Uterus and bilateral adnexa are unremarkable. Other: Tiny fat containing broad-based umbilical hernia. Rectum unremarkable. Musculoskeletal: No acute bone abnormality. Degenerative changes are seen within the lumbar spine. No lytic or blastic bone lesion. IMPRESSION: No acute intra-abdominal pathology identified. No definite radiographic explanation for the patient's reported symptoms. Redemonstration of a percutaneous drainage catheter within the mid pelvis anteriorly with complete evacuation of the previously noted pelvic abscess seen on CT examination of 05/26/2021. Mild sigmoid diverticulosis. No significant pericolonic inflammatory change identified. Aortic Atherosclerosis (ICD10-I70.0). Electronically Signed   By: Helyn NumbersAshesh  Parikh MD   On: 07/01/2021 22:19   CT HEAD WO CONTRAST  Result Date: 07/02/2021 CLINICAL DATA:  Acute metabolic encephalopathy.  Lethargy. EXAM: CT HEAD WITHOUT CONTRAST TECHNIQUE: Contiguous axial images were obtained from the base of the skull through the vertex without intravenous contrast. 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 Stable tiny peripheral infarct within the left cerebellar hemisphere. Normal anatomic configuration of the brain. Mild parenchymal volume loss is commensurate with the patient's age. Mild periventricular white matter changes are present likely reflecting the sequela of small vessel ischemia. No evidence of acute intracranial hemorrhage or infarct. No abnormal mass effect or midline shift. No abnormal intra or extra-axial mass lesion or fluid collection. Ventricular size is  normal. Vascular: No hyperdense vessel or unexpected calcification. Skull: Intact Sinuses/Orbits: Interval bilateral ethmoidectomy. There is, additionally, development of remote bilateral medial orbital wall fractures. 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 Resultant increasing proptosis of the ocular globes bilaterally., Other: The mastoid air cells and middle ear cavities are clear. IMPRESSION: No acute intracranial abnormality. 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 Interval remote medial orbital wall fractures bilaterally and ethmoidectomy. Electronically Signed   By: Helyn NumbersAshesh  Parikh MD   On: 07/02/2021 03:00   CT ABDOMEN PELVIS W CONTRAST  Result Date: 06/12/2021 CLINICAL DATA:  Intra-abdominal abscess.  EXAM: CT ABDOMEN AND PELVIS WITH CONTRAST TECHNIQUE: Multidetector CT imaging of the abdomen and pelvis was performed using the standard protocol following bolus administration of intravenous contrast. CONTRAST:  OMNIPAQUE IOHEXOL 300 MG/ML  SOLN COMPARISON:  June 03, 2021. FINDINGS: Lower chest: No acute abnormality. Hepatobiliary: No focal liver abnormality is seen. No gallstones, gallbladder wall thickening, or biliary dilatation. Pancreas: Unremarkable. No pancreatic ductal dilatation or surrounding inflammatory changes. Spleen: Normal in size without focal abnormality. Adrenals/Urinary Tract: Adrenal glands are unremarkable. Kidneys are normal, without renal calculi, focal lesion, or hydronephrosis. Bladder is unremarkable. Stomach/Bowel: Stomach appears normal. No colonic dilatation is noted. No evidence of bowel obstruction is noted. Vascular/Lymphatic: Aortic atherosclerosis. No enlarged abdominal or pelvic lymph nodes. IVC filter is noted in  infrarenal position. Reproductive: Uterus and bilateral adnexa are unremarkable. Other: There is been interval placement of percutaneous drainage catheter into large pelvic abscess noted on prior exam. The abscess appears to be nearly completely decompressed. Musculoskeletal: No acute or significant osseous findings. IMPRESSION: Interval placement of percutaneous drainage catheter into large pelvic abscess noted on prior exam. The abscess appears to be nearly completely decompressed. Electronically Signed   By: Lupita Raider M.D.   On: 06/12/2021 16:07   IR Sinus/Fist Tube Chk-Non GI  Result Date: 07/03/2021 INDICATION: Status post percutaneous catheter drainage of left pelvic diverticular abscess on 06/06/2021. Assessment for possible fistula prior to drain removal. EXAM: INJECTION OF ABSCESS DRAINAGE CATHETER UNDER FLUOROSCOPY MEDICATIONS: No medications were administered. ANESTHESIA/SEDATION: No sedation was administered. CONTRAST:  5 mL Omnipaque 300 FLUOROSCOPY TIME:  6 seconds.  4.0 minutes COMPLICATIONS: None immediate. PROCEDURE: The pre-existing drainage catheter was injected under fluoroscopy. There is preferential outflow of contrast via the tract and onto the skin. No residual abscess cavity is identified. There is no evidence of fistula to bowel. IMPRESSION: No abscess cavity or fistula identified with contrast injection of the percutaneous drainage catheter. Due to low output, the drainage catheter was removed following the injection procedure today. Electronically Signed   By: Irish Lack M.D.   On: 07/03/2021 16:59   IR Sinus/Fist Tube Chk-Non GI  Result Date: 06/14/2021 INDICATION: 70 year old woman status post pelvic abscess drain placement on 06/16/2021 returns for obsessive g given decreased output. EXAM: SINUS TRACT INJECTION / FISTULOGRAM COMPARISON:  None. MEDICATIONS: The patient is currently admitted to the hospital and receiving intravenous antibiotics. The antibiotics were  administered within an appropriate time frame prior to the initiation of the procedure. ANESTHESIA/SEDATION: None COMPLICATIONS: None immediate. TECHNIQUE: Informed written consent was obtained from the patient after a thorough discussion of the procedural risks, benefits and alternatives. A timeout was performed prior to the initiation of the procedure. PROCEDURE: Patient positioned supine on the procedure table. Scout image demonstrated pelvic drain in appropriate position. Contrast administered through the drain demonstrated a residual abscess cavity measuring at least 20 mL in size. No fistulous communication is identified. IMPRESSION: Pelvic abscessogram demonstrates significant residual cavity. No fistulous communication identified. Drain left in place given persistent cavity. PLAN: Consider abscessogram and/or drain removal once net daily output is less than 10 mL. Electronically Signed   By: Acquanetta Belling M.D.   On: 06/14/2021 14:12   US RENAL  Result Date: 07/02/2021 CLINICAL DATA:  Initial evaluation for renal failure. EXAM: RENAL / URINARY TRACT ULTRASOUND COMPLETE COMPARISON:  CT from 07/01/2021. FINDINGS: Right Kidney: Renal measurements: 10.6 x 4.5 x 4.7 cm = volume: 115.9 mL. Renal echogenicity within normal limits. No nephrolithiasis or hydronephrosis. No focal  renal mass. Left Kidney: Renal measurements: 10.0 x 5.8 x 4.9 cm = volume: 148.1 mL. Renal echogenicity within normal limits. No nephrolithiasis or hydronephrosis. No focal renal mass. Bladder: Appears normal for degree of bladder distention. Other: None. IMPRESSION: Normal renal ultrasound. No hydronephrosis or other significant finding. Electronically Signed   By: Rise Mu M.D.   On: 07/02/2021 01:33   DG Chest Port 1 View  Result Date: 07/01/2021 CLINICAL DATA:  Confusion, sepsis, tachycardia EXAM: PORTABLE CHEST 1 VIEW COMPARISON:  05/26/2021 FINDINGS: Single frontal view of the chest demonstrates a stable cardiac  silhouette. Chronic prominence of the central pulmonary vasculature. No airspace disease, effusion, or pneumothorax. No acute bony abnormalities. IMPRESSION: 1. Stable chest, no acute process. Electronically Signed   By: Sharlet Salina M.D.   On: 07/01/2021 16:09   EEG adult  Result Date: 07/03/2021 Charlsie Quest, MD     07/03/2021 10:37 AM Patient Name: [name] MRN: [id number] Epilepsy Attending: Charlsie Quest Referring Physician/Provider: Dr Shauna Hugh Date: 07/03/2021 Duration: 24.11 mins Patient history: 69 year old female with altered mental status and seizure-like activity.  EEG to evaluate for seizures. Level of alertness: Awake, asleep AEDs during EEG study: None Technical aspects: This EEG study was done with scalp electrodes positioned according to the 10-20 International system of electrode placement. Electrical activity was acquired at a sampling rate of  and reviewed with a high frequency filter of  and a low frequency filter of . EEG data were recorded continuously and digitally stored. Description: The posterior dominant rhythm consists of 8-9 Hz activity of moderate voltage (25-35 uV) seen predominantly in posterior head regions, symmetric and reactive to eye opening and eye closing. Sleep was characterized by vertex waves, sleep spindles (12 to 14 Hz), maximal frontocentral region. Single seizure without clinical signs was noted on 07/03/2021 at 0953 arising from left parieto-occipital region, lasting 50 seconds.  Hyperventilation and photic stimulation were not performed.   ABNORMALITY -Seizure without clinical signs, left parieto-occipital region IMPRESSION: This study showed one seizure without clinical signs on 07/03/2021 at 0953 arising from left parieto-occipital region, lasting 50 seconds. Please consider long-term EEG monitoring if concern for ictal-interictal activity persists. Priyanka Annabelle Harman   Overnight EEG with video  Result Date: 07/06/2021 Charlsie Quest, MD     07/06/2021  3:30 PM Patient Name: [name] MRN: [id number] Epilepsy Attending: Charlsie Quest Referring Physician/Provider: Dr Erick Blinks Duration: 07/05/2021 0857 to 07/06/2021 0857  Patient history: 70 year old female with altered mental status and seizure-like activity.  EEG to evaluate for seizures.  Level of alertness: Awake, asleep  AEDs during EEG study: LEV  Technical aspects: This EEG study was done with scalp electrodes positioned according to the 10-20 International system of electrode placement. Electrical activity was acquired at a sampling rate of  and reviewed with a high frequency filter of  and a low frequency filter of . EEG data were recorded continuously and digitally stored.  Description: The posterior dominant rhythm consists of 8-9 Hz activity of moderate voltage (25-35 uV) seen predominantly in posterior head regions, symmetric and reactive to eye opening and eye closing. Sleep was characterized by vertex waves, sleep spindles (12 to 14 Hz), maximal frontocentral region.   Hyperventilation and photic stimulation were not performed.   IMPRESSION: This study is within normal limits. No seizures or epileptiform discharges were seen during the study.  Priyanka Annabelle Harman   CT Angio Abd/Pel w/ and/or w/o  Result Date: 06/14/2021 CLINICAL  DATA:  GI bleed EXAM: CTA ABDOMEN AND PELVIS WITHOUT AND WITH CONTRAST TECHNIQUE: Multidetector CT imaging of the abdomen and pelvis was performed using the standard protocol during bolus administration of intravenous contrast. Multiplanar reconstructed images and MIPs were obtained and reviewed to evaluate the vascular anatomy. CONTRAST:  OMNIPAQUE IOHEXOL 350 MG/ML SOLN COMPARISON:  None. FINDINGS: During the study, the injector shot off which stops the scanning. Only arterial phase imaging in the upper abdomen was obtained. The study was restarted, but this resulted in non arterial phase imaging through the  pelvis. Therefore, the study is nondiagnostic for GI bleed. VASCULAR Aorta: Normal caliber.  Scattered calcifications. Celiac: Patent SMA: Patent Renals: Patent IMA: Patent Inflow: Calcifications in the iliac vessels. No aneurysm or dissection. Proximal Outflow: Grossly unremarkable Veins: No obvious venous abnormality within the limitations of this study. IVC filter in the infrarenal IVC. Review of the MIP images confirms the above findings. NON-VASCULAR Lower chest: No acute abnormality. Hepatobiliary: No focal hepatic abnormality. Gallbladder unremarkable. Pancreas: No focal abnormality or ductal dilatation. Spleen: No focal abnormality.  Normal size. Adrenals/Urinary Tract: No adrenal abnormality. No focal renal abnormality. No stones or hydronephrosis. Urinary bladder is unremarkable. Stomach/Bowel: Scattered colonic diverticula. No active extravasation seen within the transverse colon, hepatic flexure or splenic flexure. Cannot assess for contrast extravasation in the lower abdomen and pelvis. Lymphatic: No adenopathy Reproductive: Uterus and adnexa unremarkable.  No mass. Other: There is a midline pigtail drainage catheter in place in the pelvis just superior to the urinary bladder. No surrounding fluid collection. No free fluid or free air. Musculoskeletal: No acute bony abnormality. IMPRESSION: VASCULAR Nondiagnostic study for GI bleed due to injector and scanner malfunction. This could be repeated in 24-48 hours if symptoms persist and felt clinically indicated. No evidence of aortic aneurysm or dissection. Scattered aortic and iliac calcifications. NON-VASCULAR Scattered colonic diverticulosis. Electronically Signed   By: Charlett Nose M.D.   On: 06/14/2021 00:39     Discharge Exam: Vitals:   07/12/21 0754 07/12/21 0810  BP: 101/71 101/65  Pulse: 90 97  Resp: 17 18  Temp: 99 F (37.2 C) 98.5 F (36.9 C)  SpO2: 96% 98%   Vitals:   07/11/21 2010 07/12/21 0318 07/12/21 0754 07/12/21 0810  BP:  119/68 100/70 101/71 101/65  Pulse: 82 89 90 97  Resp: 20 18 17 18   Temp: 97.9 F (36.6 C) 98.3 F (36.8 C) 99 F (37.2 C) 98.5 F (36.9 C)  TempSrc: Oral Oral Oral Oral  SpO2: 98% 98% 96% 98%  Weight:      Height:        General: Pt is alert, awake, not in acute distress Cardiovascular: RRR, S1/S2 +, no rubs, no gallops Respiratory: CTA bilaterally, no wheezing, no rhonchi Abdominal: Soft, NT, ND, bowel sounds + Extremities: no edema, no cyanosis    The results of significant diagnostics from this hospitalization (including imaging, microbiology, ancillary and laboratory) are listed below for reference.     Microbiology: Recent Results (from the past 240 hour(s))  MRSA Next Gen by PCR, Nasal     Status: None   Collection Time: 07/02/21 11:03 AM   Specimen: Nasal Mucosa; Nasal Swab  Result Value Ref Range Status   MRSA by PCR Next Gen NOT DETECTED NOT DETECTED Final    Comment: (NOTE) The GeneXpert MRSA Assay (FDA approved for NASAL specimens only), is one component of a comprehensive MRSA colonization surveillance program. It is not intended to diagnose MRSA infection nor to  guide or monitor treatment for MRSA infections. Test performance is not FDA approved in patients less than 41 years old. Performed at [facility], [address], [place], [place] [address]   C Difficile Quick Screen w PCR reflex     Status: None   Collection Time: 07/03/21 10:15 AM   Specimen: STOOL  Result Value Ref Range Status   C Diff antigen NEGATIVE NEGATIVE Final   C Diff toxin NEGATIVE NEGATIVE Final   C Diff interpretation No C. difficile detected.  Final    Comment: Performed at [facility], [address] [place], [place] [address]  Resp Panel by RT-PCR (Flu A&B, Covid) Nasopharyngeal Swab     Status: None   Collection Time: 07/10/21  1:05 PM   Specimen: Nasopharyngeal Swab; Nasopharyngeal(NP) swabs in vial transport medium  Result Value Ref  Range Status   SARS Coronavirus 2 by RT PCR NEGATIVE NEGATIVE Final    Comment: (NOTE) SARS-CoV-2 target nucleic acids are NOT DETECTED.  The SARS-CoV-2 RNA is generally detectable in upper respiratory specimens during the acute phase of infection. The lowest concentration of SARS-CoV-2 viral copies this assay can detect is 138 copies/mL. A negative result does not preclude SARS-Cov-2 infection and should not be used as the sole basis for treatment or other patient management decisions. A negative result may occur with  improper specimen collection/handling, submission of specimen other than nasopharyngeal swab, presence of viral mutation(s) within the areas targeted by this assay, and inadequate number of viral copies(<138 copies/mL). A negative result must be combined with clinical observations, patient history, and epidemiological information. The expected result is Negative.  Fact Sheet for Patients:  BloggerCourse.com  Fact Sheet for Healthcare Providers:  SeriousBroker.it  This test is no t yet approved or cleared by the [place] FDA and  has been authorized for detection and/or diagnosis of SARS-CoV-2 by FDA under an Emergency Use Authorization (EUA). This EUA will remain  in effect (meaning this test can be used) for the duration of the COVID-19 declaration under Section 564(b)(1) of the Act, 21 U.S.C.section 360bbb-3(b)(1), unless the authorization is terminated  or revoked sooner.       Influenza A by PCR NEGATIVE NEGATIVE Final   Influenza B by PCR NEGATIVE NEGATIVE Final    Comment: (NOTE) The Xpert Xpress SARS-CoV-2/FLU/RSV plus assay is intended as an aid in the diagnosis of influenza from Nasopharyngeal swab specimens and should not be used as a sole basis for treatment. Nasal washings and aspirates are unacceptable for Xpert Xpress SARS-CoV-2/FLU/RSV testing.  Fact Sheet for  Patients: BloggerCourse.com  Fact Sheet for Healthcare Providers: SeriousBroker.it  This test is not yet approved or cleared by the [place] FDA and has been authorized for detection and/or diagnosis of SARS-CoV-2 by FDA under an Emergency Use Authorization (EUA). This EUA will remain in effect (meaning this test can be used) for the duration of the COVID-19 declaration under Section 564(b)(1) of the Act, 21 U.S.C. section 360bbb-3(b)(1), unless the authorization is terminated or revoked.  Performed at [facility], [address], [place], [place] [address]      Labs: BNP (last 3 results) Recent Labs    05/23/21 2000  BNP 100.2*   Basic Metabolic Panel: Recent Labs  Lab 07/06/21 0050 07/07/21 0611 07/10/21 0908  NA 135 137 133*  K 4.6 4.3 3.7  CL 108 110 108  CO2 22 21* 20*  GLUCOSE 81 91 170*  BUN 8 7* 7*  CREATININE 1.55* 1.48* 1.21*  CALCIUM 8.1* 8.3* 8.0*  MG 1.7  --   --    Liver Function Tests: No results for input(s): AST, ALT, ALKPHOS, BILITOT, PROT, ALBUMIN in the last 168 hours. No results for input(s): LIPASE, AMYLASE in the last 168 hours. No results for input(s): AMMONIA in the last 168 hours. CBC: No results for input(s): WBC, NEUTROABS, HGB, HCT, MCV, PLT in the last 168 hours. Cardiac Enzymes: No results for input(s): CKTOTAL, CKMB, CKMBINDEX, TROPONINI in the last 168 hours. BNP: Invalid input(s): POCBNP CBG: No results for input(s): GLUCAP in the last 168 hours. D-Dimer No results for input(s): DDIMER in the last 72 hours. Hgb A1c No results for input(s): HGBA1C in the last 72 hours. Lipid Profile No results for input(s): CHOL, HDL, LDLCALC, TRIG, CHOLHDL, LDLDIRECT in the last 72 hours. Thyroid function studies No results for input(s): TSH, T4TOTAL, T3FREE, THYROIDAB in the last 72 hours.  Invalid input(s): FREET3 Anemia work up No results for input(s): VITAMINB12, FOLATE,  FERRITIN, TIBC, IRON, RETICCTPCT in the last 72 hours. Urinalysis    Component Value Date/Time   COLORURINE YELLOW 07/04/2021 1800   APPEARANCEUR CLEAR 07/04/2021 1800   LABSPEC 1.012 07/04/2021 1800   PHURINE 5.0 07/04/2021 1800   GLUCOSEU NEGATIVE 07/04/2021 1800   GLUCOSEU NEGATIVE 07/13/2020 0900   HGBUR NEGATIVE 07/04/2021 1800   BILIRUBINUR NEGATIVE 07/04/2021 1800   KETONESUR NEGATIVE 07/04/2021 1800   PROTEINUR NEGATIVE 07/04/2021 1800   UROBILINOGEN 0.2 07/13/2020 0900   NITRITE NEGATIVE 07/04/2021 1800   LEUKOCYTESUR TRACE (A) 07/04/2021 1800   Sepsis Labs Invalid input(s): PROCALCITONIN,  WBC,  LACTICIDVEN Microbiology Recent Results (from the past 240 hour(s))  MRSA Next Gen by PCR, Nasal     Status: None   Collection Time: 07/02/21 11:03 AM   Specimen: Nasal Mucosa; Nasal Swab  Result Value Ref Range Status   MRSA by PCR Next Gen NOT DETECTED NOT DETECTED Final    Comment: (NOTE) The GeneXpert MRSA Assay (FDA approved for NASAL specimens only), is one component of a comprehensive MRSA colonization surveillance program. It is not intended to diagnose MRSA infection nor to guide or monitor treatment for MRSA infections. Test performance is not FDA approved in patients less than 37 years old. Performed at [facility], [address], [place], [place] [address]   C Difficile Quick Screen w PCR reflex     Status: None   Collection Time: 07/03/21 10:15 AM   Specimen: STOOL  Result Value Ref Range Status   C Diff antigen NEGATIVE NEGATIVE Final   C Diff toxin NEGATIVE NEGATIVE Final   C Diff interpretation No C. difficile detected.  Final    Comment: Performed at [facility] [address] [place], [place] [address]  Resp Panel by RT-PCR (Flu A&B, Covid) Nasopharyngeal Swab     Status: None   Collection Time: 07/10/21  1:05 PM   Specimen: Nasopharyngeal Swab; Nasopharyngeal(NP) swabs in vial transport medium  Result Value  Ref Range Status   SARS Coronavirus 2 by RT PCR NEGATIVE NEGATIVE Final    Comment: (NOTE) SARS-CoV-2 target nucleic acids are NOT DETECTED.  The SARS-CoV-2 RNA is generally detectable in upper respiratory specimens during the acute phase of infection. The lowest concentration of SARS-CoV-2 viral copies this assay can detect is 138 copies/mL. A negative result does not preclude SARS-Cov-2 infection and should not be used as the sole basis for treatment or other patient management decisions. A negative  result may occur with  improper specimen collection/handling, submission of specimen other than nasopharyngeal swab, presence of viral mutation(s) within the areas targeted by this assay, and inadequate number of viral copies(<138 copies/mL). A negative result must be combined with clinical observations, patient history, and epidemiological information. The expected result is Negative.  Fact Sheet for Patients:  BloggerCourse.com  Fact Sheet for Healthcare Providers:  SeriousBroker.it  This test is no t yet approved or cleared by the [place] FDA and  has been authorized for detection and/or diagnosis of SARS-CoV-2 by FDA under an Emergency Use Authorization (EUA). This EUA will remain  in effect (meaning this test can be used) for the duration of the COVID-19 declaration under Section 564(b)(1) of the Act, 21 U.S.C.section 360bbb-3(b)(1), unless the authorization is terminated  or revoked sooner.       Influenza A by PCR NEGATIVE NEGATIVE Final   Influenza B by PCR NEGATIVE NEGATIVE Final    Comment: (NOTE) The Xpert Xpress SARS-CoV-2/FLU/RSV plus assay is intended as an aid in the diagnosis of influenza from Nasopharyngeal swab specimens and should not be used as a sole basis for treatment. Nasal washings and aspirates are unacceptable for Xpert Xpress SARS-CoV-2/FLU/RSV testing.  Fact Sheet for  Patients: BloggerCourse.com  Fact Sheet for Healthcare Providers: SeriousBroker.it  This test is not yet approved or cleared by the [place] FDA and has been authorized for detection and/or diagnosis of SARS-CoV-2 by FDA under an Emergency Use Authorization (EUA). This EUA will remain in effect (meaning this test can be used) for the duration of the COVID-19 declaration under Section 564(b)(1) of the Act, 21 U.S.C. section 360bbb-3(b)(1), unless the authorization is terminated or revoked.  Performed at [facility], [address], [place], [place] [address]      Time coordinating discharge: Over 30 minutes  SIGNED:   Hughie Closs, MD  [facility] 07/12/2021, 9:22 AM  If 7PM-7AM, please contact night-coverage [email or web address]

## 2021-07-12 NOTE — Progress Notes (Signed)
Pt discharging to SNF, [facility].  D/C instructions reviewed with pt/pt's family (daughter; Whitney)  and given a copy of instructions. Pt waiting for transport ([facility]) to pick pt up and take to [facility].  Copy of instructions printed and placed in packet for facility.   Await transport.

## 2021-07-12 NOTE — Progress Notes (Signed)
[phone] on phone waiting to give report or giving report to receiving nurse at [facility].  Report given to nurse Precious. Waiting [facility] transport to pick pt up to take to facility. Pt is a returning pt to [facility].

## 2021-07-12 NOTE — Progress Notes (Signed)
Physical Therapy Treatment Patient Details Name: [name] MRN: [id number] DOB: 1951/04/16 Today's Date: 07/12/2021    History of Present Illness 70 y/o female presented to [facility] ED on 7/16 for confusion and a period of "twitching" at rehab facility. EEG on 7/18 revealed one seizure. Trasnferred to [facility]. Long term EEG on 7/20 revealed no seizures. Recently d/c'ed to SNF after recent admission 6/10-7/8 for perforated diveriticulitis with abscess. PMH: anemia, CHF, DM, graves disease, HTN, obesity, stroke, seizure, and thyroid disease    PT Comments    Pt making gradual progress today.  She was able to ambulate 49' and 15' with seated rest brakes.  Required min cues for safety and transfer techniques.  Continue plan of care.    Follow Up Recommendations  SNF     Equipment Recommendations  None recommended by PT    Recommendations for Other Services       Precautions / Restrictions Precautions Precautions: Fall Precaution Comments: monitor HR, seizure precaution    Mobility  Bed Mobility Overal bed mobility: Needs Assistance Bed Mobility: Supine to Sit     Supine to sit: Supervision;HOB elevated     General bed mobility comments: Increased time and use of rails but able to do without assist    Transfers Overall transfer level: Needs assistance Equipment used: Rolling walker (2 wheeled) Transfers: Sit to/from Stand Sit to Stand: Min guard;Min assist         General transfer comment: X 1 from elevated bed with min guard; x 2 from recliner with min A; Pt using momentum and cues to lean forward to stand  Ambulation/Gait Ambulation/Gait assistance: Min assist;+2 safety/equipment Gait Distance (Feet): 15 Feet (11' then 15') Assistive device: Rolling walker (2 wheeled) Gait Pattern/deviations: Step-to pattern;Decreased stride length Gait velocity: decreased   General Gait Details: Ambulated 71' and then 15' with seated rest break.  Min cues for RW proximity and  posture.  Had close chair follow as pt reports knees will "give out"   Stairs             Wheelchair Mobility    Modified Rankin (Stroke Patients Only)       Balance Overall balance assessment: Needs assistance Sitting-balance support: No upper extremity supported;Feet supported Sitting balance-Leahy Scale: Good     Standing balance support: Bilateral upper extremity supported Standing balance-Leahy Scale: Poor Standing balance comment: reliant on UE support but stable with RW                            Cognition Arousal/Alertness: Awake/alert Behavior During Therapy: WFL for tasks assessed/performed Overall Cognitive Status: Within Functional Limits for tasks assessed                                        Exercises      General Comments General comments (skin integrity, edema, etc.): VSS on RA.  Pt expressing frustrations about lack of communication among medical staff in regards to her d/c today.  Answered questions as able with scope of practice.      Pertinent Vitals/Pain Pain Assessment: No/denies pain    Home Living                      Prior Function            PT Goals (current goals can now be  found in the care plan section) Acute Rehab PT Goals Patient Stated Goal: to get better PT Goal Formulation: With patient Time For Goal Achievement: 07/18/21 Potential to Achieve Goals: Good Progress towards PT goals: Progressing toward goals    Frequency    Min 3X/week      PT Plan Current plan remains appropriate    Co-evaluation              AM-PAC PT "6 Clicks" Mobility   Outcome Measure  Help needed turning from your back to your side while in a flat bed without using bedrails?: A Little Help needed moving from lying on your back to sitting on the side of a flat bed without using bedrails?: A Little Help needed moving to and from a bed to a chair (including a wheelchair)?: A Little Help needed  standing up from a chair using your arms (e.g., wheelchair or bedside chair)?: A Little Help needed to walk in hospital room?: A Little Help needed climbing 3-5 steps with a railing? : A Lot 6 Click Score: 17    End of Session Equipment Utilized During Treatment: Gait belt Activity Tolerance: Patient tolerated treatment well Patient left: with call bell/phone within reach;in chair;with chair alarm set Nurse Communication: Mobility status PT Visit Diagnosis: Muscle weakness (generalized) (M62.81);Difficulty in walking, not elsewhere classified (R26.2);Unsteadiness on feet (R26.81);Other abnormalities of gait and mobility (R26.89)     Time: 7341-9379 PT Time Calculation (min) (ACUTE ONLY): 21 min  Charges:  $Gait Training: 8-22 mins                     Anise Salvo, PT Acute Rehab Services Pager [phone] [facility] [phone]    Rayetta Humphrey 07/12/2021, 1:15 PM

## 2021-07-17 DIAGNOSIS — K5792 Diverticulitis of intestine, part unspecified, without perforation or abscess without bleeding: Secondary | ICD-10-CM | POA: Diagnosis not present

## 2021-07-17 DIAGNOSIS — M6281 Muscle weakness (generalized): Secondary | ICD-10-CM | POA: Diagnosis not present

## 2021-07-17 DIAGNOSIS — E039 Hypothyroidism, unspecified: Secondary | ICD-10-CM | POA: Diagnosis not present

## 2021-07-17 DIAGNOSIS — E1169 Type 2 diabetes mellitus with other specified complication: Secondary | ICD-10-CM | POA: Diagnosis not present

## 2021-07-17 DIAGNOSIS — N1831 Chronic kidney disease, stage 3a: Secondary | ICD-10-CM | POA: Diagnosis not present

## 2021-07-17 DIAGNOSIS — R2681 Unsteadiness on feet: Secondary | ICD-10-CM | POA: Diagnosis not present

## 2021-07-17 DIAGNOSIS — E0822 Diabetes mellitus due to underlying condition with diabetic chronic kidney disease: Secondary | ICD-10-CM | POA: Diagnosis not present

## 2021-07-17 DIAGNOSIS — R2689 Other abnormalities of gait and mobility: Secondary | ICD-10-CM | POA: Diagnosis not present

## 2021-07-17 DIAGNOSIS — E119 Type 2 diabetes mellitus without complications: Secondary | ICD-10-CM | POA: Diagnosis not present

## 2021-07-17 DIAGNOSIS — K5720 Diverticulitis of large intestine with perforation and abscess without bleeding: Secondary | ICD-10-CM | POA: Diagnosis not present

## 2021-07-17 DIAGNOSIS — E872 Acidosis: Secondary | ICD-10-CM | POA: Diagnosis not present

## 2021-07-17 DIAGNOSIS — K219 Gastro-esophageal reflux disease without esophagitis: Secondary | ICD-10-CM | POA: Diagnosis not present

## 2021-07-17 DIAGNOSIS — E785 Hyperlipidemia, unspecified: Secondary | ICD-10-CM | POA: Diagnosis not present

## 2021-07-17 DIAGNOSIS — R69 Illness, unspecified: Secondary | ICD-10-CM | POA: Diagnosis not present

## 2021-07-17 DIAGNOSIS — E118 Type 2 diabetes mellitus with unspecified complications: Secondary | ICD-10-CM | POA: Diagnosis not present

## 2021-07-17 DIAGNOSIS — R41841 Cognitive communication deficit: Secondary | ICD-10-CM | POA: Diagnosis not present

## 2021-07-17 DIAGNOSIS — E0821 Diabetes mellitus due to underlying condition with diabetic nephropathy: Secondary | ICD-10-CM | POA: Diagnosis not present

## 2021-07-17 DIAGNOSIS — N179 Acute kidney failure, unspecified: Secondary | ICD-10-CM | POA: Diagnosis not present

## 2021-07-17 DIAGNOSIS — G9341 Metabolic encephalopathy: Secondary | ICD-10-CM | POA: Diagnosis not present

## 2021-07-17 DIAGNOSIS — R488 Other symbolic dysfunctions: Secondary | ICD-10-CM | POA: Diagnosis not present

## 2021-07-17 DIAGNOSIS — I509 Heart failure, unspecified: Secondary | ICD-10-CM | POA: Diagnosis not present

## 2021-07-24 DIAGNOSIS — K219 Gastro-esophageal reflux disease without esophagitis: Secondary | ICD-10-CM | POA: Diagnosis not present

## 2021-07-24 DIAGNOSIS — E1169 Type 2 diabetes mellitus with other specified complication: Secondary | ICD-10-CM | POA: Diagnosis not present

## 2021-07-24 DIAGNOSIS — K5792 Diverticulitis of intestine, part unspecified, without perforation or abscess without bleeding: Secondary | ICD-10-CM | POA: Diagnosis not present

## 2021-07-24 DIAGNOSIS — I509 Heart failure, unspecified: Secondary | ICD-10-CM | POA: Diagnosis not present

## 2021-07-24 DIAGNOSIS — N1831 Chronic kidney disease, stage 3a: Secondary | ICD-10-CM | POA: Diagnosis not present

## 2021-07-24 DIAGNOSIS — E039 Hypothyroidism, unspecified: Secondary | ICD-10-CM | POA: Diagnosis not present

## 2021-07-24 DIAGNOSIS — R69 Illness, unspecified: Secondary | ICD-10-CM | POA: Diagnosis not present

## 2021-07-24 DIAGNOSIS — E119 Type 2 diabetes mellitus without complications: Secondary | ICD-10-CM | POA: Diagnosis not present

## 2021-07-24 DIAGNOSIS — E785 Hyperlipidemia, unspecified: Secondary | ICD-10-CM | POA: Diagnosis not present

## 2021-07-27 DIAGNOSIS — I2699 Other pulmonary embolism without acute cor pulmonale: Secondary | ICD-10-CM | POA: Diagnosis not present

## 2021-07-27 DIAGNOSIS — K900 Celiac disease: Secondary | ICD-10-CM | POA: Diagnosis not present

## 2021-07-27 DIAGNOSIS — I5030 Unspecified diastolic (congestive) heart failure: Secondary | ICD-10-CM | POA: Diagnosis not present

## 2021-07-27 DIAGNOSIS — J9601 Acute respiratory failure with hypoxia: Secondary | ICD-10-CM | POA: Diagnosis not present

## 2021-07-27 DIAGNOSIS — R5383 Other fatigue: Secondary | ICD-10-CM | POA: Diagnosis not present

## 2021-07-27 DIAGNOSIS — K5730 Diverticulosis of large intestine without perforation or abscess without bleeding: Secondary | ICD-10-CM | POA: Diagnosis not present

## 2021-07-27 DIAGNOSIS — I110 Hypertensive heart disease with heart failure: Secondary | ICD-10-CM | POA: Diagnosis not present

## 2021-07-27 DIAGNOSIS — D693 Immune thrombocytopenic purpura: Secondary | ICD-10-CM | POA: Diagnosis not present

## 2021-07-27 DIAGNOSIS — R569 Unspecified convulsions: Secondary | ICD-10-CM | POA: Diagnosis not present

## 2021-07-27 DIAGNOSIS — R69 Illness, unspecified: Secondary | ICD-10-CM | POA: Diagnosis not present

## 2021-07-27 DIAGNOSIS — N1831 Chronic kidney disease, stage 3a: Secondary | ICD-10-CM | POA: Diagnosis not present

## 2021-07-27 DIAGNOSIS — K5780 Diverticulitis of intestine, part unspecified, with perforation and abscess without bleeding: Secondary | ICD-10-CM | POA: Diagnosis not present

## 2021-07-27 DIAGNOSIS — E1122 Type 2 diabetes mellitus with diabetic chronic kidney disease: Secondary | ICD-10-CM | POA: Diagnosis not present

## 2021-07-27 DIAGNOSIS — D631 Anemia in chronic kidney disease: Secondary | ICD-10-CM | POA: Diagnosis not present

## 2021-07-31 DIAGNOSIS — N1831 Chronic kidney disease, stage 3a: Secondary | ICD-10-CM | POA: Diagnosis not present

## 2021-07-31 DIAGNOSIS — D631 Anemia in chronic kidney disease: Secondary | ICD-10-CM | POA: Diagnosis not present

## 2021-07-31 DIAGNOSIS — J9601 Acute respiratory failure with hypoxia: Secondary | ICD-10-CM | POA: Diagnosis not present

## 2021-07-31 DIAGNOSIS — D693 Immune thrombocytopenic purpura: Secondary | ICD-10-CM | POA: Diagnosis not present

## 2021-07-31 DIAGNOSIS — E1122 Type 2 diabetes mellitus with diabetic chronic kidney disease: Secondary | ICD-10-CM | POA: Diagnosis not present

## 2021-07-31 DIAGNOSIS — K900 Celiac disease: Secondary | ICD-10-CM | POA: Diagnosis not present

## 2021-07-31 DIAGNOSIS — I5030 Unspecified diastolic (congestive) heart failure: Secondary | ICD-10-CM | POA: Diagnosis not present

## 2021-07-31 DIAGNOSIS — I2699 Other pulmonary embolism without acute cor pulmonale: Secondary | ICD-10-CM | POA: Diagnosis not present

## 2021-07-31 DIAGNOSIS — I110 Hypertensive heart disease with heart failure: Secondary | ICD-10-CM | POA: Diagnosis not present

## 2021-07-31 DIAGNOSIS — K5780 Diverticulitis of intestine, part unspecified, with perforation and abscess without bleeding: Secondary | ICD-10-CM | POA: Diagnosis not present

## 2021-08-02 DIAGNOSIS — Z7984 Long term (current) use of oral hypoglycemic drugs: Secondary | ICD-10-CM | POA: Diagnosis not present

## 2021-08-02 DIAGNOSIS — N1831 Chronic kidney disease, stage 3a: Secondary | ICD-10-CM | POA: Diagnosis not present

## 2021-08-02 DIAGNOSIS — I5032 Chronic diastolic (congestive) heart failure: Secondary | ICD-10-CM | POA: Diagnosis not present

## 2021-08-02 DIAGNOSIS — I872 Venous insufficiency (chronic) (peripheral): Secondary | ICD-10-CM | POA: Diagnosis not present

## 2021-08-02 DIAGNOSIS — I10 Essential (primary) hypertension: Secondary | ICD-10-CM | POA: Diagnosis not present

## 2021-08-02 DIAGNOSIS — E876 Hypokalemia: Secondary | ICD-10-CM | POA: Diagnosis not present

## 2021-08-02 DIAGNOSIS — E05 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: Secondary | ICD-10-CM | POA: Diagnosis not present

## 2021-08-02 DIAGNOSIS — E039 Hypothyroidism, unspecified: Secondary | ICD-10-CM | POA: Diagnosis not present

## 2021-08-02 DIAGNOSIS — E1122 Type 2 diabetes mellitus with diabetic chronic kidney disease: Secondary | ICD-10-CM | POA: Diagnosis not present

## 2021-08-02 DIAGNOSIS — L439 Lichen planus, unspecified: Secondary | ICD-10-CM | POA: Diagnosis not present

## 2021-08-02 DIAGNOSIS — E7800 Pure hypercholesterolemia, unspecified: Secondary | ICD-10-CM | POA: Diagnosis not present

## 2021-08-03 DIAGNOSIS — I5030 Unspecified diastolic (congestive) heart failure: Secondary | ICD-10-CM | POA: Diagnosis not present

## 2021-08-03 DIAGNOSIS — J9601 Acute respiratory failure with hypoxia: Secondary | ICD-10-CM | POA: Diagnosis not present

## 2021-08-03 DIAGNOSIS — N1831 Chronic kidney disease, stage 3a: Secondary | ICD-10-CM | POA: Diagnosis not present

## 2021-08-03 DIAGNOSIS — I110 Hypertensive heart disease with heart failure: Secondary | ICD-10-CM | POA: Diagnosis not present

## 2021-08-03 DIAGNOSIS — D631 Anemia in chronic kidney disease: Secondary | ICD-10-CM | POA: Diagnosis not present

## 2021-08-03 DIAGNOSIS — I2699 Other pulmonary embolism without acute cor pulmonale: Secondary | ICD-10-CM | POA: Diagnosis not present

## 2021-08-03 DIAGNOSIS — K900 Celiac disease: Secondary | ICD-10-CM | POA: Diagnosis not present

## 2021-08-03 DIAGNOSIS — D693 Immune thrombocytopenic purpura: Secondary | ICD-10-CM | POA: Diagnosis not present

## 2021-08-03 DIAGNOSIS — E1122 Type 2 diabetes mellitus with diabetic chronic kidney disease: Secondary | ICD-10-CM | POA: Diagnosis not present

## 2021-08-03 DIAGNOSIS — K5780 Diverticulitis of intestine, part unspecified, with perforation and abscess without bleeding: Secondary | ICD-10-CM | POA: Diagnosis not present

## 2021-08-09 DIAGNOSIS — D693 Immune thrombocytopenic purpura: Secondary | ICD-10-CM | POA: Diagnosis not present

## 2021-08-09 DIAGNOSIS — D631 Anemia in chronic kidney disease: Secondary | ICD-10-CM | POA: Diagnosis not present

## 2021-08-09 DIAGNOSIS — E1122 Type 2 diabetes mellitus with diabetic chronic kidney disease: Secondary | ICD-10-CM | POA: Diagnosis not present

## 2021-08-09 DIAGNOSIS — J9601 Acute respiratory failure with hypoxia: Secondary | ICD-10-CM | POA: Diagnosis not present

## 2021-08-09 DIAGNOSIS — I2699 Other pulmonary embolism without acute cor pulmonale: Secondary | ICD-10-CM | POA: Diagnosis not present

## 2021-08-09 DIAGNOSIS — I110 Hypertensive heart disease with heart failure: Secondary | ICD-10-CM | POA: Diagnosis not present

## 2021-08-09 DIAGNOSIS — N1831 Chronic kidney disease, stage 3a: Secondary | ICD-10-CM | POA: Diagnosis not present

## 2021-08-09 DIAGNOSIS — I5030 Unspecified diastolic (congestive) heart failure: Secondary | ICD-10-CM | POA: Diagnosis not present

## 2021-08-09 DIAGNOSIS — K900 Celiac disease: Secondary | ICD-10-CM | POA: Diagnosis not present

## 2021-08-09 DIAGNOSIS — K5780 Diverticulitis of intestine, part unspecified, with perforation and abscess without bleeding: Secondary | ICD-10-CM | POA: Diagnosis not present

## 2021-08-11 DIAGNOSIS — I110 Hypertensive heart disease with heart failure: Secondary | ICD-10-CM | POA: Diagnosis not present

## 2021-08-11 DIAGNOSIS — D693 Immune thrombocytopenic purpura: Secondary | ICD-10-CM | POA: Diagnosis not present

## 2021-08-11 DIAGNOSIS — D631 Anemia in chronic kidney disease: Secondary | ICD-10-CM | POA: Diagnosis not present

## 2021-08-11 DIAGNOSIS — N1831 Chronic kidney disease, stage 3a: Secondary | ICD-10-CM | POA: Diagnosis not present

## 2021-08-11 DIAGNOSIS — E1122 Type 2 diabetes mellitus with diabetic chronic kidney disease: Secondary | ICD-10-CM | POA: Diagnosis not present

## 2021-08-11 DIAGNOSIS — K5780 Diverticulitis of intestine, part unspecified, with perforation and abscess without bleeding: Secondary | ICD-10-CM | POA: Diagnosis not present

## 2021-08-11 DIAGNOSIS — K900 Celiac disease: Secondary | ICD-10-CM | POA: Diagnosis not present

## 2021-08-11 DIAGNOSIS — J9601 Acute respiratory failure with hypoxia: Secondary | ICD-10-CM | POA: Diagnosis not present

## 2021-08-11 DIAGNOSIS — I5030 Unspecified diastolic (congestive) heart failure: Secondary | ICD-10-CM | POA: Diagnosis not present

## 2021-08-11 DIAGNOSIS — I2699 Other pulmonary embolism without acute cor pulmonale: Secondary | ICD-10-CM | POA: Diagnosis not present

## 2021-08-15 DIAGNOSIS — D693 Immune thrombocytopenic purpura: Secondary | ICD-10-CM | POA: Diagnosis not present

## 2021-08-15 DIAGNOSIS — I5030 Unspecified diastolic (congestive) heart failure: Secondary | ICD-10-CM | POA: Diagnosis not present

## 2021-08-15 DIAGNOSIS — K900 Celiac disease: Secondary | ICD-10-CM | POA: Diagnosis not present

## 2021-08-15 DIAGNOSIS — I110 Hypertensive heart disease with heart failure: Secondary | ICD-10-CM | POA: Diagnosis not present

## 2021-08-15 DIAGNOSIS — J9601 Acute respiratory failure with hypoxia: Secondary | ICD-10-CM | POA: Diagnosis not present

## 2021-08-15 DIAGNOSIS — D631 Anemia in chronic kidney disease: Secondary | ICD-10-CM | POA: Diagnosis not present

## 2021-08-15 DIAGNOSIS — E1122 Type 2 diabetes mellitus with diabetic chronic kidney disease: Secondary | ICD-10-CM | POA: Diagnosis not present

## 2021-08-15 DIAGNOSIS — K5780 Diverticulitis of intestine, part unspecified, with perforation and abscess without bleeding: Secondary | ICD-10-CM | POA: Diagnosis not present

## 2021-08-15 DIAGNOSIS — N1831 Chronic kidney disease, stage 3a: Secondary | ICD-10-CM | POA: Diagnosis not present

## 2021-08-15 DIAGNOSIS — I2699 Other pulmonary embolism without acute cor pulmonale: Secondary | ICD-10-CM | POA: Diagnosis not present

## 2021-08-16 DIAGNOSIS — G4719 Other hypersomnia: Secondary | ICD-10-CM | POA: Diagnosis not present

## 2021-08-16 DIAGNOSIS — R208 Other disturbances of skin sensation: Secondary | ICD-10-CM | POA: Diagnosis not present

## 2021-08-16 DIAGNOSIS — M79606 Pain in leg, unspecified: Secondary | ICD-10-CM | POA: Diagnosis not present

## 2021-08-17 DIAGNOSIS — E113293 Type 2 diabetes mellitus with mild nonproliferative diabetic retinopathy without macular edema, bilateral: Secondary | ICD-10-CM | POA: Diagnosis not present

## 2021-08-17 DIAGNOSIS — I110 Hypertensive heart disease with heart failure: Secondary | ICD-10-CM | POA: Diagnosis not present

## 2021-08-17 DIAGNOSIS — H2513 Age-related nuclear cataract, bilateral: Secondary | ICD-10-CM | POA: Diagnosis not present

## 2021-08-17 DIAGNOSIS — D693 Immune thrombocytopenic purpura: Secondary | ICD-10-CM | POA: Diagnosis not present

## 2021-08-17 DIAGNOSIS — K900 Celiac disease: Secondary | ICD-10-CM | POA: Diagnosis not present

## 2021-08-17 DIAGNOSIS — I5030 Unspecified diastolic (congestive) heart failure: Secondary | ICD-10-CM | POA: Diagnosis not present

## 2021-08-17 DIAGNOSIS — K5780 Diverticulitis of intestine, part unspecified, with perforation and abscess without bleeding: Secondary | ICD-10-CM | POA: Diagnosis not present

## 2021-08-17 DIAGNOSIS — E1122 Type 2 diabetes mellitus with diabetic chronic kidney disease: Secondary | ICD-10-CM | POA: Diagnosis not present

## 2021-08-17 DIAGNOSIS — J9601 Acute respiratory failure with hypoxia: Secondary | ICD-10-CM | POA: Diagnosis not present

## 2021-08-17 DIAGNOSIS — N1831 Chronic kidney disease, stage 3a: Secondary | ICD-10-CM | POA: Diagnosis not present

## 2021-08-17 DIAGNOSIS — I2699 Other pulmonary embolism without acute cor pulmonale: Secondary | ICD-10-CM | POA: Diagnosis not present

## 2021-08-17 DIAGNOSIS — D631 Anemia in chronic kidney disease: Secondary | ICD-10-CM | POA: Diagnosis not present

## 2021-08-17 DIAGNOSIS — H04123 Dry eye syndrome of bilateral lacrimal glands: Secondary | ICD-10-CM | POA: Diagnosis not present

## 2021-08-23 DIAGNOSIS — I5030 Unspecified diastolic (congestive) heart failure: Secondary | ICD-10-CM | POA: Diagnosis not present

## 2021-08-23 DIAGNOSIS — D631 Anemia in chronic kidney disease: Secondary | ICD-10-CM | POA: Diagnosis not present

## 2021-08-23 DIAGNOSIS — N1831 Chronic kidney disease, stage 3a: Secondary | ICD-10-CM | POA: Diagnosis not present

## 2021-08-23 DIAGNOSIS — E1122 Type 2 diabetes mellitus with diabetic chronic kidney disease: Secondary | ICD-10-CM | POA: Diagnosis not present

## 2021-08-23 DIAGNOSIS — I110 Hypertensive heart disease with heart failure: Secondary | ICD-10-CM | POA: Diagnosis not present

## 2021-08-23 DIAGNOSIS — I2699 Other pulmonary embolism without acute cor pulmonale: Secondary | ICD-10-CM | POA: Diagnosis not present

## 2021-08-23 DIAGNOSIS — K5780 Diverticulitis of intestine, part unspecified, with perforation and abscess without bleeding: Secondary | ICD-10-CM | POA: Diagnosis not present

## 2021-08-23 DIAGNOSIS — D693 Immune thrombocytopenic purpura: Secondary | ICD-10-CM | POA: Diagnosis not present

## 2021-08-23 DIAGNOSIS — E05 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: Secondary | ICD-10-CM | POA: Diagnosis not present

## 2021-08-23 DIAGNOSIS — J9601 Acute respiratory failure with hypoxia: Secondary | ICD-10-CM | POA: Diagnosis not present

## 2021-08-23 DIAGNOSIS — K900 Celiac disease: Secondary | ICD-10-CM | POA: Diagnosis not present

## 2021-08-24 DIAGNOSIS — J9601 Acute respiratory failure with hypoxia: Secondary | ICD-10-CM | POA: Diagnosis not present

## 2021-08-24 DIAGNOSIS — E1122 Type 2 diabetes mellitus with diabetic chronic kidney disease: Secondary | ICD-10-CM | POA: Diagnosis not present

## 2021-08-24 DIAGNOSIS — K900 Celiac disease: Secondary | ICD-10-CM | POA: Diagnosis not present

## 2021-08-24 DIAGNOSIS — I2699 Other pulmonary embolism without acute cor pulmonale: Secondary | ICD-10-CM | POA: Diagnosis not present

## 2021-08-24 DIAGNOSIS — D693 Immune thrombocytopenic purpura: Secondary | ICD-10-CM | POA: Diagnosis not present

## 2021-08-24 DIAGNOSIS — K5780 Diverticulitis of intestine, part unspecified, with perforation and abscess without bleeding: Secondary | ICD-10-CM | POA: Diagnosis not present

## 2021-08-24 DIAGNOSIS — N1831 Chronic kidney disease, stage 3a: Secondary | ICD-10-CM | POA: Diagnosis not present

## 2021-08-24 DIAGNOSIS — I5030 Unspecified diastolic (congestive) heart failure: Secondary | ICD-10-CM | POA: Diagnosis not present

## 2021-08-24 DIAGNOSIS — D631 Anemia in chronic kidney disease: Secondary | ICD-10-CM | POA: Diagnosis not present

## 2021-08-24 DIAGNOSIS — I110 Hypertensive heart disease with heart failure: Secondary | ICD-10-CM | POA: Diagnosis not present

## 2021-08-28 DIAGNOSIS — D693 Immune thrombocytopenic purpura: Secondary | ICD-10-CM | POA: Diagnosis not present

## 2021-08-28 DIAGNOSIS — K5780 Diverticulitis of intestine, part unspecified, with perforation and abscess without bleeding: Secondary | ICD-10-CM | POA: Diagnosis not present

## 2021-08-28 DIAGNOSIS — E1122 Type 2 diabetes mellitus with diabetic chronic kidney disease: Secondary | ICD-10-CM | POA: Diagnosis not present

## 2021-08-28 DIAGNOSIS — D631 Anemia in chronic kidney disease: Secondary | ICD-10-CM | POA: Diagnosis not present

## 2021-08-28 DIAGNOSIS — I5030 Unspecified diastolic (congestive) heart failure: Secondary | ICD-10-CM | POA: Diagnosis not present

## 2021-08-28 DIAGNOSIS — J9601 Acute respiratory failure with hypoxia: Secondary | ICD-10-CM | POA: Diagnosis not present

## 2021-08-28 DIAGNOSIS — K900 Celiac disease: Secondary | ICD-10-CM | POA: Diagnosis not present

## 2021-08-28 DIAGNOSIS — N1831 Chronic kidney disease, stage 3a: Secondary | ICD-10-CM | POA: Diagnosis not present

## 2021-08-28 DIAGNOSIS — I2699 Other pulmonary embolism without acute cor pulmonale: Secondary | ICD-10-CM | POA: Diagnosis not present

## 2021-08-28 DIAGNOSIS — I110 Hypertensive heart disease with heart failure: Secondary | ICD-10-CM | POA: Diagnosis not present

## 2021-08-29 DIAGNOSIS — H0520 Unspecified exophthalmos: Secondary | ICD-10-CM | POA: Diagnosis not present

## 2021-08-29 DIAGNOSIS — N1831 Chronic kidney disease, stage 3a: Secondary | ICD-10-CM | POA: Diagnosis not present

## 2021-08-29 DIAGNOSIS — I2699 Other pulmonary embolism without acute cor pulmonale: Secondary | ICD-10-CM | POA: Diagnosis not present

## 2021-08-29 DIAGNOSIS — I5030 Unspecified diastolic (congestive) heart failure: Secondary | ICD-10-CM | POA: Diagnosis not present

## 2021-08-29 DIAGNOSIS — E113293 Type 2 diabetes mellitus with mild nonproliferative diabetic retinopathy without macular edema, bilateral: Secondary | ICD-10-CM | POA: Diagnosis not present

## 2021-08-29 DIAGNOSIS — Z0100 Encounter for examination of eyes and vision without abnormal findings: Secondary | ICD-10-CM | POA: Diagnosis not present

## 2021-08-29 DIAGNOSIS — D631 Anemia in chronic kidney disease: Secondary | ICD-10-CM | POA: Diagnosis not present

## 2021-08-29 DIAGNOSIS — H04123 Dry eye syndrome of bilateral lacrimal glands: Secondary | ICD-10-CM | POA: Diagnosis not present

## 2021-08-29 DIAGNOSIS — I110 Hypertensive heart disease with heart failure: Secondary | ICD-10-CM | POA: Diagnosis not present

## 2021-08-29 DIAGNOSIS — K5780 Diverticulitis of intestine, part unspecified, with perforation and abscess without bleeding: Secondary | ICD-10-CM | POA: Diagnosis not present

## 2021-08-29 DIAGNOSIS — K900 Celiac disease: Secondary | ICD-10-CM | POA: Diagnosis not present

## 2021-08-29 DIAGNOSIS — E1122 Type 2 diabetes mellitus with diabetic chronic kidney disease: Secondary | ICD-10-CM | POA: Diagnosis not present

## 2021-08-29 DIAGNOSIS — J9601 Acute respiratory failure with hypoxia: Secondary | ICD-10-CM | POA: Diagnosis not present

## 2021-08-29 DIAGNOSIS — D693 Immune thrombocytopenic purpura: Secondary | ICD-10-CM | POA: Diagnosis not present

## 2021-08-29 DIAGNOSIS — H2513 Age-related nuclear cataract, bilateral: Secondary | ICD-10-CM | POA: Diagnosis not present

## 2021-08-31 DIAGNOSIS — I110 Hypertensive heart disease with heart failure: Secondary | ICD-10-CM | POA: Diagnosis not present

## 2021-08-31 DIAGNOSIS — I5030 Unspecified diastolic (congestive) heart failure: Secondary | ICD-10-CM | POA: Diagnosis not present

## 2021-08-31 DIAGNOSIS — N1831 Chronic kidney disease, stage 3a: Secondary | ICD-10-CM | POA: Diagnosis not present

## 2021-08-31 DIAGNOSIS — K5780 Diverticulitis of intestine, part unspecified, with perforation and abscess without bleeding: Secondary | ICD-10-CM | POA: Diagnosis not present

## 2021-08-31 DIAGNOSIS — E1122 Type 2 diabetes mellitus with diabetic chronic kidney disease: Secondary | ICD-10-CM | POA: Diagnosis not present

## 2021-08-31 DIAGNOSIS — J9601 Acute respiratory failure with hypoxia: Secondary | ICD-10-CM | POA: Diagnosis not present

## 2021-08-31 DIAGNOSIS — E05 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: Secondary | ICD-10-CM | POA: Diagnosis not present

## 2021-08-31 DIAGNOSIS — D693 Immune thrombocytopenic purpura: Secondary | ICD-10-CM | POA: Diagnosis not present

## 2021-08-31 DIAGNOSIS — D631 Anemia in chronic kidney disease: Secondary | ICD-10-CM | POA: Diagnosis not present

## 2021-08-31 DIAGNOSIS — K900 Celiac disease: Secondary | ICD-10-CM | POA: Diagnosis not present

## 2021-08-31 DIAGNOSIS — I2699 Other pulmonary embolism without acute cor pulmonale: Secondary | ICD-10-CM | POA: Diagnosis not present

## 2021-09-01 DIAGNOSIS — J9601 Acute respiratory failure with hypoxia: Secondary | ICD-10-CM | POA: Diagnosis not present

## 2021-09-01 DIAGNOSIS — K900 Celiac disease: Secondary | ICD-10-CM | POA: Diagnosis not present

## 2021-09-01 DIAGNOSIS — D631 Anemia in chronic kidney disease: Secondary | ICD-10-CM | POA: Diagnosis not present

## 2021-09-01 DIAGNOSIS — D693 Immune thrombocytopenic purpura: Secondary | ICD-10-CM | POA: Diagnosis not present

## 2021-09-01 DIAGNOSIS — N1831 Chronic kidney disease, stage 3a: Secondary | ICD-10-CM | POA: Diagnosis not present

## 2021-09-01 DIAGNOSIS — I110 Hypertensive heart disease with heart failure: Secondary | ICD-10-CM | POA: Diagnosis not present

## 2021-09-01 DIAGNOSIS — E1122 Type 2 diabetes mellitus with diabetic chronic kidney disease: Secondary | ICD-10-CM | POA: Diagnosis not present

## 2021-09-01 DIAGNOSIS — I2699 Other pulmonary embolism without acute cor pulmonale: Secondary | ICD-10-CM | POA: Diagnosis not present

## 2021-09-01 DIAGNOSIS — K5780 Diverticulitis of intestine, part unspecified, with perforation and abscess without bleeding: Secondary | ICD-10-CM | POA: Diagnosis not present

## 2021-09-01 DIAGNOSIS — I5030 Unspecified diastolic (congestive) heart failure: Secondary | ICD-10-CM | POA: Diagnosis not present

## 2021-09-04 DIAGNOSIS — D631 Anemia in chronic kidney disease: Secondary | ICD-10-CM | POA: Diagnosis not present

## 2021-09-04 DIAGNOSIS — I110 Hypertensive heart disease with heart failure: Secondary | ICD-10-CM | POA: Diagnosis not present

## 2021-09-04 DIAGNOSIS — I2699 Other pulmonary embolism without acute cor pulmonale: Secondary | ICD-10-CM | POA: Diagnosis not present

## 2021-09-04 DIAGNOSIS — J9601 Acute respiratory failure with hypoxia: Secondary | ICD-10-CM | POA: Diagnosis not present

## 2021-09-04 DIAGNOSIS — K5780 Diverticulitis of intestine, part unspecified, with perforation and abscess without bleeding: Secondary | ICD-10-CM | POA: Diagnosis not present

## 2021-09-04 DIAGNOSIS — K900 Celiac disease: Secondary | ICD-10-CM | POA: Diagnosis not present

## 2021-09-04 DIAGNOSIS — I5030 Unspecified diastolic (congestive) heart failure: Secondary | ICD-10-CM | POA: Diagnosis not present

## 2021-09-04 DIAGNOSIS — D693 Immune thrombocytopenic purpura: Secondary | ICD-10-CM | POA: Diagnosis not present

## 2021-09-04 DIAGNOSIS — N1831 Chronic kidney disease, stage 3a: Secondary | ICD-10-CM | POA: Diagnosis not present

## 2021-09-04 DIAGNOSIS — E1122 Type 2 diabetes mellitus with diabetic chronic kidney disease: Secondary | ICD-10-CM | POA: Diagnosis not present

## 2021-09-07 DIAGNOSIS — D631 Anemia in chronic kidney disease: Secondary | ICD-10-CM | POA: Diagnosis not present

## 2021-09-07 DIAGNOSIS — I5030 Unspecified diastolic (congestive) heart failure: Secondary | ICD-10-CM | POA: Diagnosis not present

## 2021-09-07 DIAGNOSIS — J9601 Acute respiratory failure with hypoxia: Secondary | ICD-10-CM | POA: Diagnosis not present

## 2021-09-07 DIAGNOSIS — N1831 Chronic kidney disease, stage 3a: Secondary | ICD-10-CM | POA: Diagnosis not present

## 2021-09-07 DIAGNOSIS — D693 Immune thrombocytopenic purpura: Secondary | ICD-10-CM | POA: Diagnosis not present

## 2021-09-07 DIAGNOSIS — K5780 Diverticulitis of intestine, part unspecified, with perforation and abscess without bleeding: Secondary | ICD-10-CM | POA: Diagnosis not present

## 2021-09-07 DIAGNOSIS — K900 Celiac disease: Secondary | ICD-10-CM | POA: Diagnosis not present

## 2021-09-07 DIAGNOSIS — E1122 Type 2 diabetes mellitus with diabetic chronic kidney disease: Secondary | ICD-10-CM | POA: Diagnosis not present

## 2021-09-07 DIAGNOSIS — I110 Hypertensive heart disease with heart failure: Secondary | ICD-10-CM | POA: Diagnosis not present

## 2021-09-07 DIAGNOSIS — I2699 Other pulmonary embolism without acute cor pulmonale: Secondary | ICD-10-CM | POA: Diagnosis not present

## 2021-09-08 DIAGNOSIS — D631 Anemia in chronic kidney disease: Secondary | ICD-10-CM | POA: Diagnosis not present

## 2021-09-08 DIAGNOSIS — J9601 Acute respiratory failure with hypoxia: Secondary | ICD-10-CM | POA: Diagnosis not present

## 2021-09-08 DIAGNOSIS — I5030 Unspecified diastolic (congestive) heart failure: Secondary | ICD-10-CM | POA: Diagnosis not present

## 2021-09-08 DIAGNOSIS — I2699 Other pulmonary embolism without acute cor pulmonale: Secondary | ICD-10-CM | POA: Diagnosis not present

## 2021-09-08 DIAGNOSIS — K5780 Diverticulitis of intestine, part unspecified, with perforation and abscess without bleeding: Secondary | ICD-10-CM | POA: Diagnosis not present

## 2021-09-08 DIAGNOSIS — E1122 Type 2 diabetes mellitus with diabetic chronic kidney disease: Secondary | ICD-10-CM | POA: Diagnosis not present

## 2021-09-08 DIAGNOSIS — I110 Hypertensive heart disease with heart failure: Secondary | ICD-10-CM | POA: Diagnosis not present

## 2021-09-08 DIAGNOSIS — D693 Immune thrombocytopenic purpura: Secondary | ICD-10-CM | POA: Diagnosis not present

## 2021-09-08 DIAGNOSIS — K900 Celiac disease: Secondary | ICD-10-CM | POA: Diagnosis not present

## 2021-09-08 DIAGNOSIS — N1831 Chronic kidney disease, stage 3a: Secondary | ICD-10-CM | POA: Diagnosis not present

## 2021-09-11 ENCOUNTER — Other Ambulatory Visit: Payer: Self-pay | Admitting: Cardiovascular Disease

## 2021-09-11 DIAGNOSIS — I5030 Unspecified diastolic (congestive) heart failure: Secondary | ICD-10-CM | POA: Diagnosis not present

## 2021-09-11 DIAGNOSIS — E1122 Type 2 diabetes mellitus with diabetic chronic kidney disease: Secondary | ICD-10-CM | POA: Diagnosis not present

## 2021-09-11 DIAGNOSIS — I2699 Other pulmonary embolism without acute cor pulmonale: Secondary | ICD-10-CM | POA: Diagnosis not present

## 2021-09-11 DIAGNOSIS — J9601 Acute respiratory failure with hypoxia: Secondary | ICD-10-CM | POA: Diagnosis not present

## 2021-09-11 DIAGNOSIS — N1831 Chronic kidney disease, stage 3a: Secondary | ICD-10-CM | POA: Diagnosis not present

## 2021-09-11 DIAGNOSIS — K5780 Diverticulitis of intestine, part unspecified, with perforation and abscess without bleeding: Secondary | ICD-10-CM | POA: Diagnosis not present

## 2021-09-11 DIAGNOSIS — D693 Immune thrombocytopenic purpura: Secondary | ICD-10-CM | POA: Diagnosis not present

## 2021-09-11 DIAGNOSIS — D631 Anemia in chronic kidney disease: Secondary | ICD-10-CM | POA: Diagnosis not present

## 2021-09-11 DIAGNOSIS — I110 Hypertensive heart disease with heart failure: Secondary | ICD-10-CM | POA: Diagnosis not present

## 2021-09-11 DIAGNOSIS — K900 Celiac disease: Secondary | ICD-10-CM | POA: Diagnosis not present

## 2021-09-12 DIAGNOSIS — R252 Cramp and spasm: Secondary | ICD-10-CM | POA: Diagnosis not present

## 2021-09-12 DIAGNOSIS — L853 Xerosis cutis: Secondary | ICD-10-CM | POA: Diagnosis not present

## 2021-09-13 DIAGNOSIS — D693 Immune thrombocytopenic purpura: Secondary | ICD-10-CM | POA: Diagnosis not present

## 2021-09-13 DIAGNOSIS — K900 Celiac disease: Secondary | ICD-10-CM | POA: Diagnosis not present

## 2021-09-13 DIAGNOSIS — J9601 Acute respiratory failure with hypoxia: Secondary | ICD-10-CM | POA: Diagnosis not present

## 2021-09-13 DIAGNOSIS — I5030 Unspecified diastolic (congestive) heart failure: Secondary | ICD-10-CM | POA: Diagnosis not present

## 2021-09-13 DIAGNOSIS — I110 Hypertensive heart disease with heart failure: Secondary | ICD-10-CM | POA: Diagnosis not present

## 2021-09-13 DIAGNOSIS — K5780 Diverticulitis of intestine, part unspecified, with perforation and abscess without bleeding: Secondary | ICD-10-CM | POA: Diagnosis not present

## 2021-09-13 DIAGNOSIS — I2699 Other pulmonary embolism without acute cor pulmonale: Secondary | ICD-10-CM | POA: Diagnosis not present

## 2021-09-13 DIAGNOSIS — N1831 Chronic kidney disease, stage 3a: Secondary | ICD-10-CM | POA: Diagnosis not present

## 2021-09-13 DIAGNOSIS — D631 Anemia in chronic kidney disease: Secondary | ICD-10-CM | POA: Diagnosis not present

## 2021-09-13 DIAGNOSIS — E1122 Type 2 diabetes mellitus with diabetic chronic kidney disease: Secondary | ICD-10-CM | POA: Diagnosis not present

## 2021-09-14 DIAGNOSIS — D631 Anemia in chronic kidney disease: Secondary | ICD-10-CM | POA: Diagnosis not present

## 2021-09-14 DIAGNOSIS — E1122 Type 2 diabetes mellitus with diabetic chronic kidney disease: Secondary | ICD-10-CM | POA: Diagnosis not present

## 2021-09-14 DIAGNOSIS — D693 Immune thrombocytopenic purpura: Secondary | ICD-10-CM | POA: Diagnosis not present

## 2021-09-14 DIAGNOSIS — N1831 Chronic kidney disease, stage 3a: Secondary | ICD-10-CM | POA: Diagnosis not present

## 2021-09-14 DIAGNOSIS — I2699 Other pulmonary embolism without acute cor pulmonale: Secondary | ICD-10-CM | POA: Diagnosis not present

## 2021-09-14 DIAGNOSIS — K900 Celiac disease: Secondary | ICD-10-CM | POA: Diagnosis not present

## 2021-09-14 DIAGNOSIS — J9601 Acute respiratory failure with hypoxia: Secondary | ICD-10-CM | POA: Diagnosis not present

## 2021-09-14 DIAGNOSIS — I5030 Unspecified diastolic (congestive) heart failure: Secondary | ICD-10-CM | POA: Diagnosis not present

## 2021-09-14 DIAGNOSIS — I110 Hypertensive heart disease with heart failure: Secondary | ICD-10-CM | POA: Diagnosis not present

## 2021-09-14 DIAGNOSIS — K5780 Diverticulitis of intestine, part unspecified, with perforation and abscess without bleeding: Secondary | ICD-10-CM | POA: Diagnosis not present

## 2021-09-19 DIAGNOSIS — E1122 Type 2 diabetes mellitus with diabetic chronic kidney disease: Secondary | ICD-10-CM | POA: Diagnosis not present

## 2021-09-19 DIAGNOSIS — K900 Celiac disease: Secondary | ICD-10-CM | POA: Diagnosis not present

## 2021-09-19 DIAGNOSIS — N1831 Chronic kidney disease, stage 3a: Secondary | ICD-10-CM | POA: Diagnosis not present

## 2021-09-19 DIAGNOSIS — K5780 Diverticulitis of intestine, part unspecified, with perforation and abscess without bleeding: Secondary | ICD-10-CM | POA: Diagnosis not present

## 2021-09-19 DIAGNOSIS — J9601 Acute respiratory failure with hypoxia: Secondary | ICD-10-CM | POA: Diagnosis not present

## 2021-09-19 DIAGNOSIS — I110 Hypertensive heart disease with heart failure: Secondary | ICD-10-CM | POA: Diagnosis not present

## 2021-09-19 DIAGNOSIS — I2699 Other pulmonary embolism without acute cor pulmonale: Secondary | ICD-10-CM | POA: Diagnosis not present

## 2021-09-19 DIAGNOSIS — D631 Anemia in chronic kidney disease: Secondary | ICD-10-CM | POA: Diagnosis not present

## 2021-09-19 DIAGNOSIS — D693 Immune thrombocytopenic purpura: Secondary | ICD-10-CM | POA: Diagnosis not present

## 2021-09-19 DIAGNOSIS — I5030 Unspecified diastolic (congestive) heart failure: Secondary | ICD-10-CM | POA: Diagnosis not present

## 2021-09-20 DIAGNOSIS — D693 Immune thrombocytopenic purpura: Secondary | ICD-10-CM | POA: Diagnosis not present

## 2021-09-20 DIAGNOSIS — N1831 Chronic kidney disease, stage 3a: Secondary | ICD-10-CM | POA: Diagnosis not present

## 2021-09-20 DIAGNOSIS — E1122 Type 2 diabetes mellitus with diabetic chronic kidney disease: Secondary | ICD-10-CM | POA: Diagnosis not present

## 2021-09-20 DIAGNOSIS — J9601 Acute respiratory failure with hypoxia: Secondary | ICD-10-CM | POA: Diagnosis not present

## 2021-09-20 DIAGNOSIS — I2699 Other pulmonary embolism without acute cor pulmonale: Secondary | ICD-10-CM | POA: Diagnosis not present

## 2021-09-20 DIAGNOSIS — E05 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: Secondary | ICD-10-CM | POA: Diagnosis not present

## 2021-09-20 DIAGNOSIS — K900 Celiac disease: Secondary | ICD-10-CM | POA: Diagnosis not present

## 2021-09-20 DIAGNOSIS — I5030 Unspecified diastolic (congestive) heart failure: Secondary | ICD-10-CM | POA: Diagnosis not present

## 2021-09-20 DIAGNOSIS — D631 Anemia in chronic kidney disease: Secondary | ICD-10-CM | POA: Diagnosis not present

## 2021-09-20 DIAGNOSIS — K5780 Diverticulitis of intestine, part unspecified, with perforation and abscess without bleeding: Secondary | ICD-10-CM | POA: Diagnosis not present

## 2021-09-20 DIAGNOSIS — I110 Hypertensive heart disease with heart failure: Secondary | ICD-10-CM | POA: Diagnosis not present

## 2021-09-20 IMAGING — CT CT HEAD W/O CM
2 of 4 series · 12 of 47 positions shown, 15 images · non-contrast
Comparison: 06/30/2010

CLINICAL DATA: Acute metabolic encephalopathy.  Lethargy.

EXAM:
CT HEAD WITHOUT CONTRAST
TECHNIQUE: Contiguous axial images were obtained from the base of the skull
through the vertex without intravenous contrast.

[Series 5: coronal soft tissue · coronal · 0.32mm/px · 3 of 75 slices shown]
[im 25/75  brain]
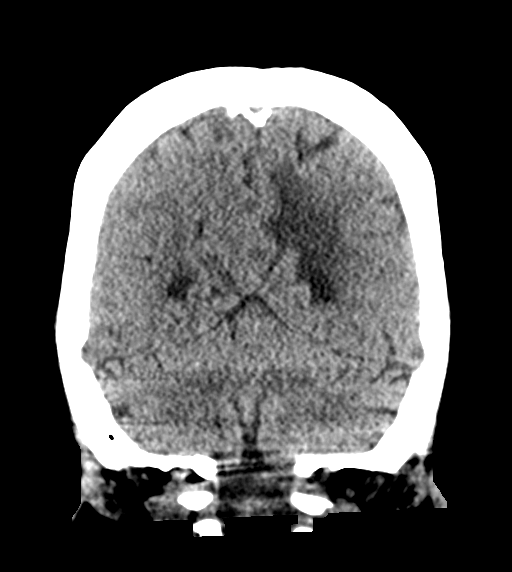
[im 33/75  brain]
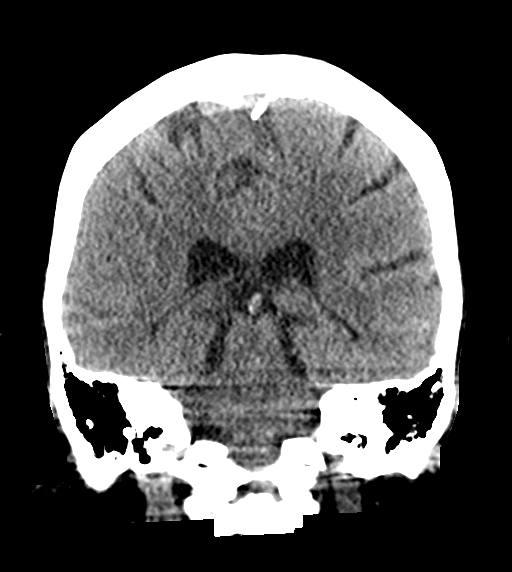
[im 42/75  brain]
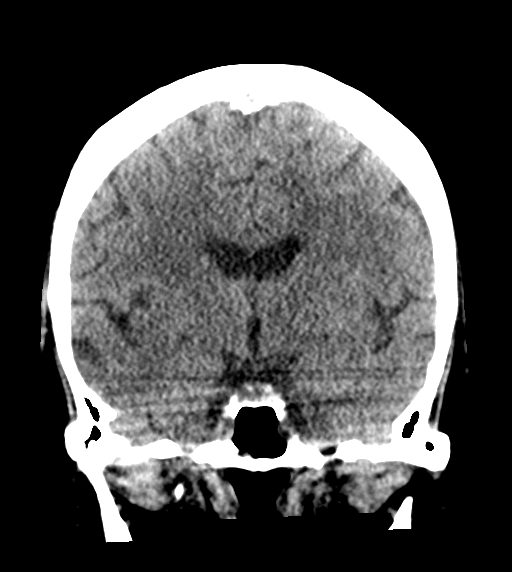

[Series 7: true axial · axial · 0.32mm/px · z∈[-168,-17]mm · 9 of 59 slices shown, 12 images]
[im 4/59  brain]
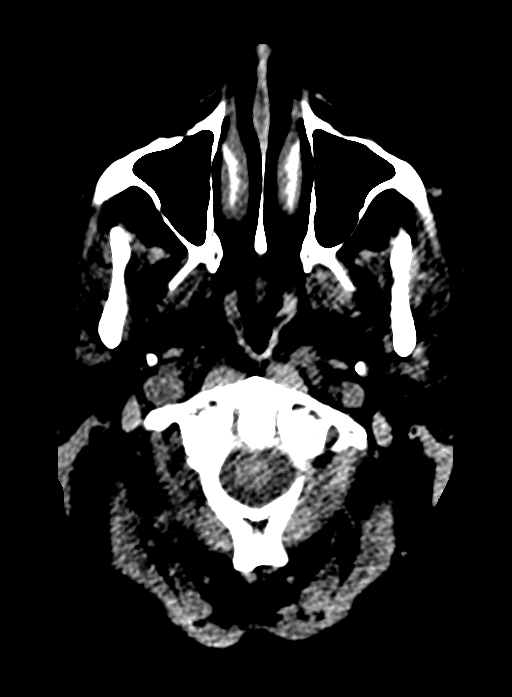
[im 4/59  bone]
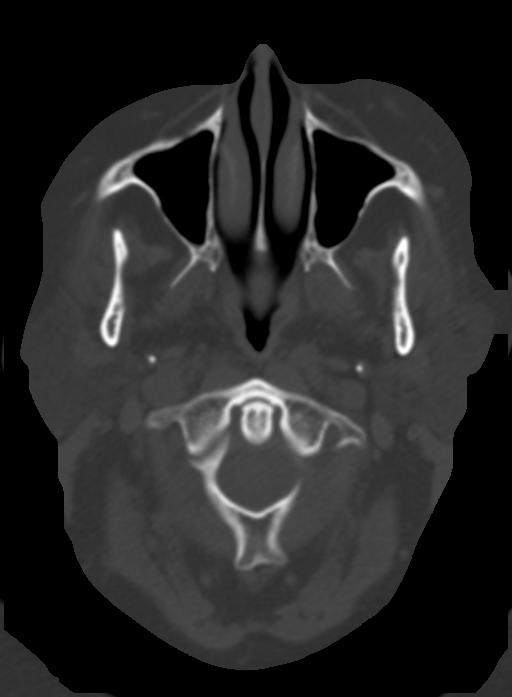
[im 10/59  brain]
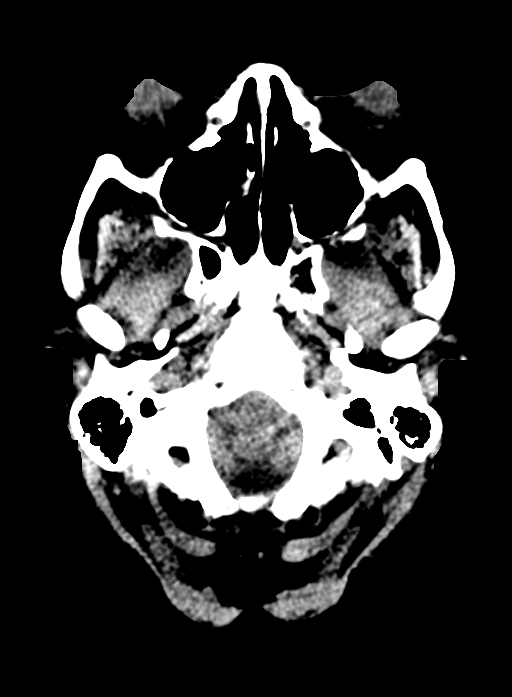
[im 17/59  brain]
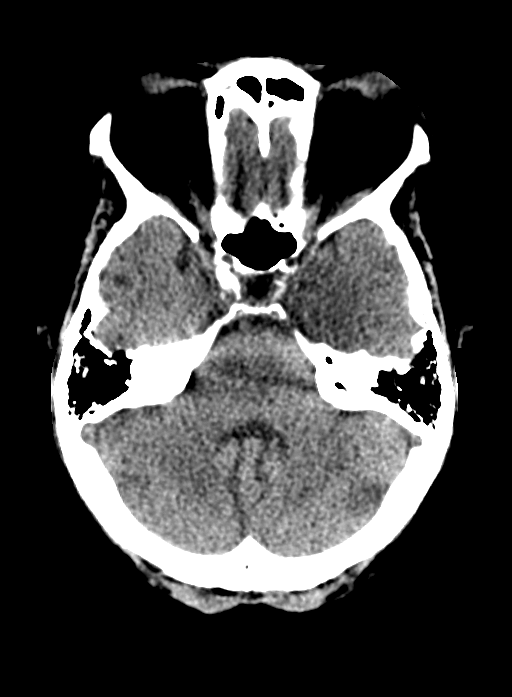
[im 23/59  brain]
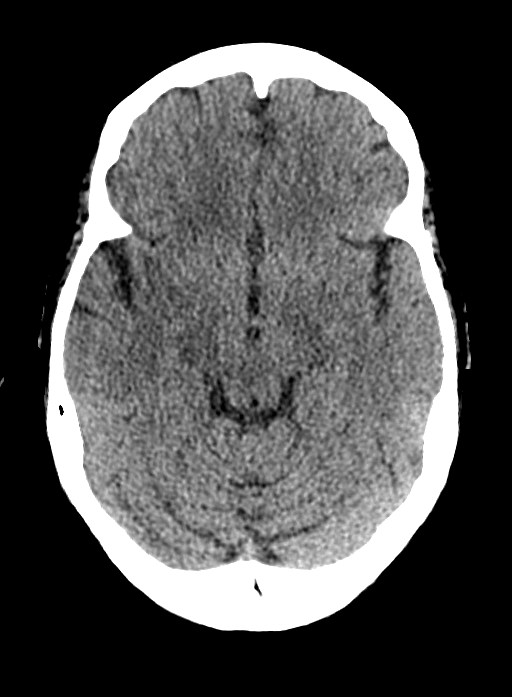
[im 30/59  brain]
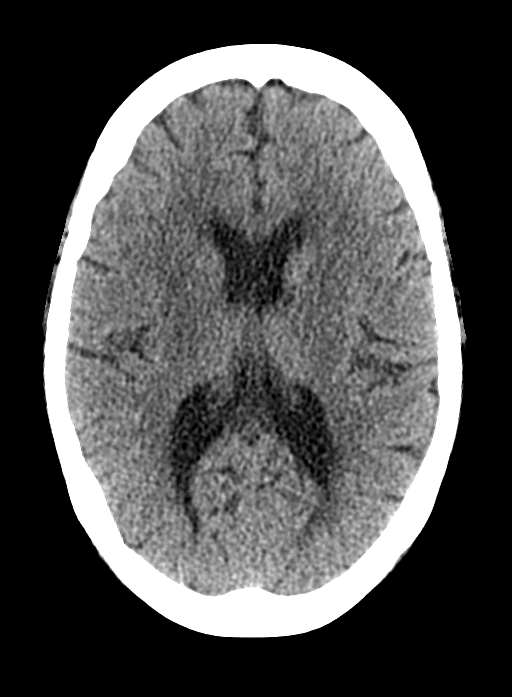
[im 30/59  bone]
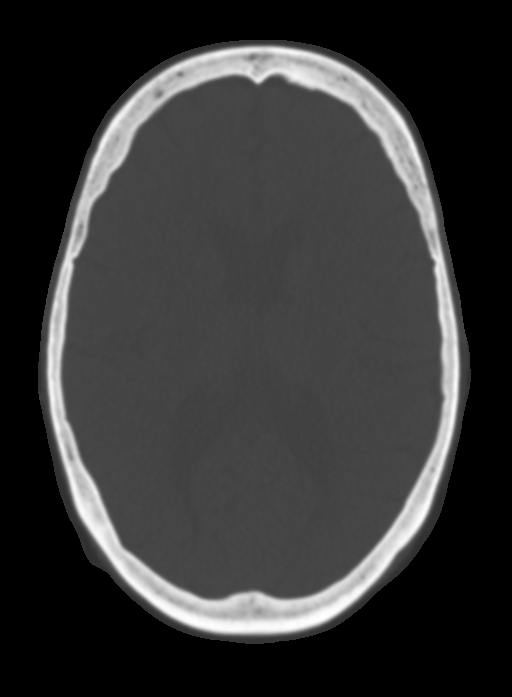
[im 36/59  brain]
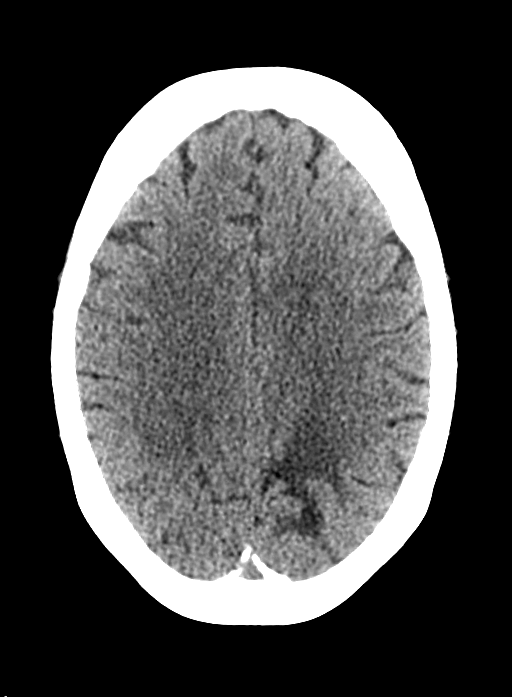
[im 42/59  brain]
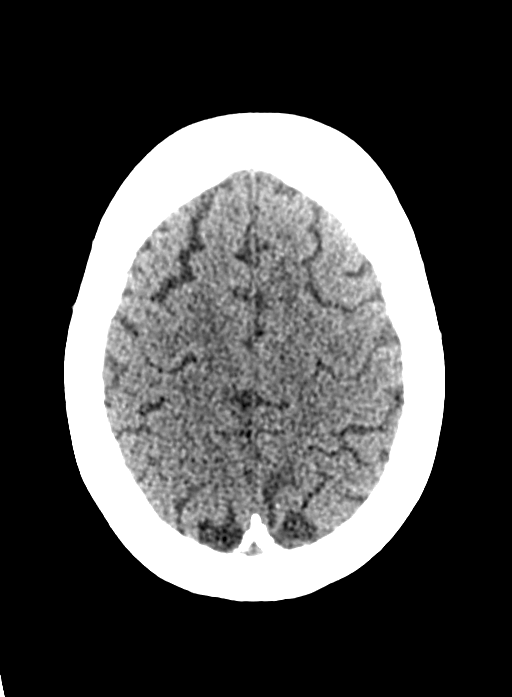
[im 49/59  brain]
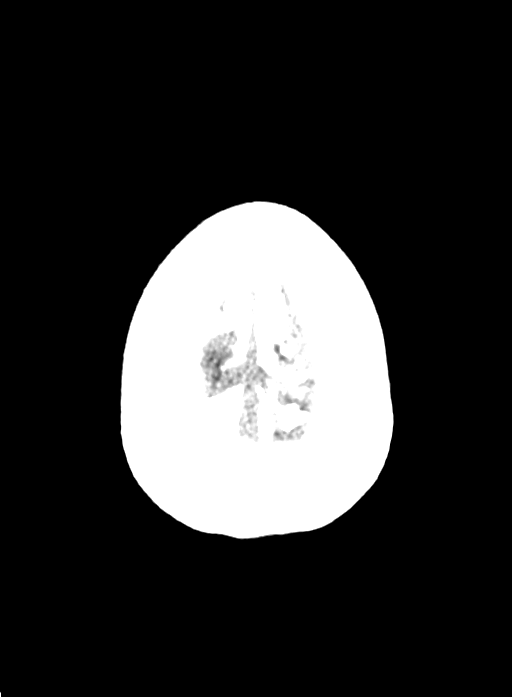
[im 55/59  brain]
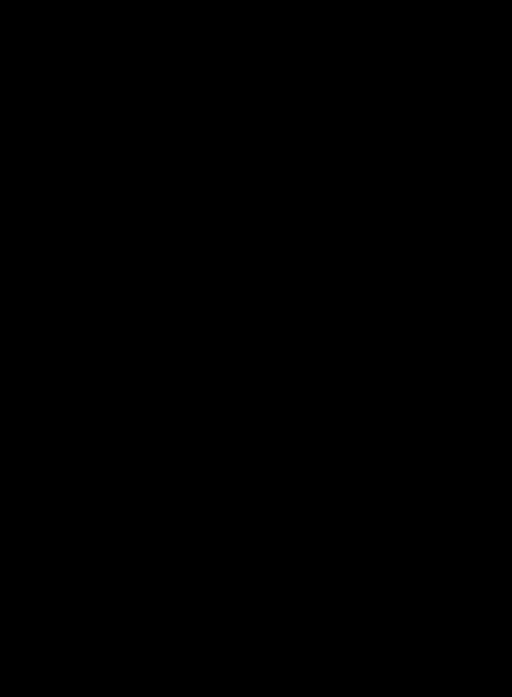
[im 55/59  bone]
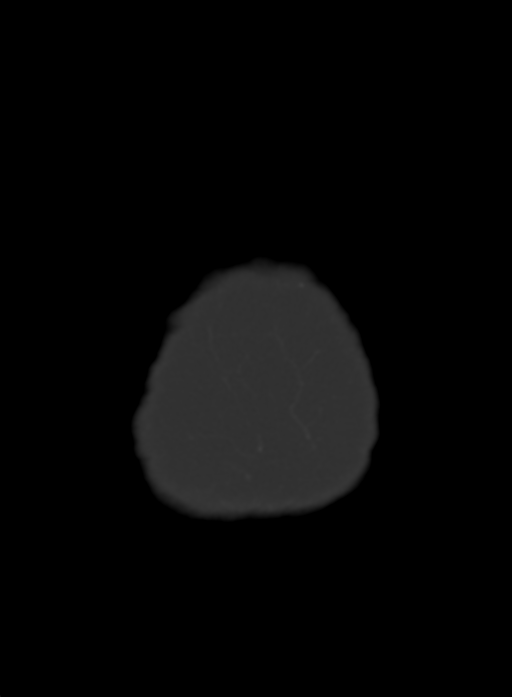

[12 of 47 positions shown; findings below may reference images not displayed]

FINDINGS: Brain: Focal encephalomalacia related to left parietal cortical
infarct is unchanged. Stable tiny peripheral infarct within the left
cerebellar hemisphere. Normal anatomic configuration of the brain.
Mild parenchymal volume loss is commensurate with the patient's age.
Mild periventricular white matter changes are present likely
reflecting the sequela of small vessel ischemia.

No evidence of acute intracranial hemorrhage or infarct. No abnormal
mass effect or midline shift. No abnormal intra or extra-axial mass
lesion or fluid collection. Ventricular size is normal.

Vascular: No hyperdense vessel or unexpected calcification.

Skull: Intact

Sinuses/Orbits: Interval bilateral ethmoidectomy. There is,
additionally, development of remote bilateral medial orbital wall
fractures. The retro-orbital fat has increased in volume since prior
examination possibly reflecting the sequela of thyroid
ophthalmopathy. Resultant increasing proptosis of the ocular globes
bilaterally.,

Other: The mastoid air cells and middle ear cavities are clear.
IMPRESSION: No acute intracranial abnormality.

Stable remote infarcts involving the left parietal cortex and left
cerebellar hemisphere.

Progressive proptosis and increasing retro-orbital fat possibly
reflecting sequela of thyroid ophthalmopathy. Interval remote medial
orbital wall fractures bilaterally and ethmoidectomy.

## 2021-09-21 DIAGNOSIS — E05 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: Secondary | ICD-10-CM | POA: Diagnosis not present

## 2021-09-22 DIAGNOSIS — I2699 Other pulmonary embolism without acute cor pulmonale: Secondary | ICD-10-CM | POA: Diagnosis not present

## 2021-09-22 DIAGNOSIS — K900 Celiac disease: Secondary | ICD-10-CM | POA: Diagnosis not present

## 2021-09-22 DIAGNOSIS — D631 Anemia in chronic kidney disease: Secondary | ICD-10-CM | POA: Diagnosis not present

## 2021-09-22 DIAGNOSIS — D693 Immune thrombocytopenic purpura: Secondary | ICD-10-CM | POA: Diagnosis not present

## 2021-09-22 DIAGNOSIS — N1831 Chronic kidney disease, stage 3a: Secondary | ICD-10-CM | POA: Diagnosis not present

## 2021-09-22 DIAGNOSIS — K5780 Diverticulitis of intestine, part unspecified, with perforation and abscess without bleeding: Secondary | ICD-10-CM | POA: Diagnosis not present

## 2021-09-22 DIAGNOSIS — I110 Hypertensive heart disease with heart failure: Secondary | ICD-10-CM | POA: Diagnosis not present

## 2021-09-22 DIAGNOSIS — I5030 Unspecified diastolic (congestive) heart failure: Secondary | ICD-10-CM | POA: Diagnosis not present

## 2021-09-22 DIAGNOSIS — J9601 Acute respiratory failure with hypoxia: Secondary | ICD-10-CM | POA: Diagnosis not present

## 2021-09-22 DIAGNOSIS — E1122 Type 2 diabetes mellitus with diabetic chronic kidney disease: Secondary | ICD-10-CM | POA: Diagnosis not present

## 2021-09-25 DIAGNOSIS — K5780 Diverticulitis of intestine, part unspecified, with perforation and abscess without bleeding: Secondary | ICD-10-CM | POA: Diagnosis not present

## 2021-09-25 DIAGNOSIS — I5030 Unspecified diastolic (congestive) heart failure: Secondary | ICD-10-CM | POA: Diagnosis not present

## 2021-09-25 DIAGNOSIS — I2699 Other pulmonary embolism without acute cor pulmonale: Secondary | ICD-10-CM | POA: Diagnosis not present

## 2021-09-25 DIAGNOSIS — E1122 Type 2 diabetes mellitus with diabetic chronic kidney disease: Secondary | ICD-10-CM | POA: Diagnosis not present

## 2021-09-25 DIAGNOSIS — D631 Anemia in chronic kidney disease: Secondary | ICD-10-CM | POA: Diagnosis not present

## 2021-09-25 DIAGNOSIS — K900 Celiac disease: Secondary | ICD-10-CM | POA: Diagnosis not present

## 2021-09-25 DIAGNOSIS — J9601 Acute respiratory failure with hypoxia: Secondary | ICD-10-CM | POA: Diagnosis not present

## 2021-09-25 DIAGNOSIS — D693 Immune thrombocytopenic purpura: Secondary | ICD-10-CM | POA: Diagnosis not present

## 2021-09-25 DIAGNOSIS — I110 Hypertensive heart disease with heart failure: Secondary | ICD-10-CM | POA: Diagnosis not present

## 2021-09-25 DIAGNOSIS — N1831 Chronic kidney disease, stage 3a: Secondary | ICD-10-CM | POA: Diagnosis not present

## 2021-10-12 DIAGNOSIS — E05 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: Secondary | ICD-10-CM | POA: Diagnosis not present

## 2021-10-13 DIAGNOSIS — E05 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: Secondary | ICD-10-CM | POA: Diagnosis not present

## 2021-10-30 DIAGNOSIS — E876 Hypokalemia: Secondary | ICD-10-CM | POA: Diagnosis not present

## 2021-10-30 DIAGNOSIS — K5780 Diverticulitis of intestine, part unspecified, with perforation and abscess without bleeding: Secondary | ICD-10-CM | POA: Diagnosis not present

## 2021-10-30 DIAGNOSIS — I2699 Other pulmonary embolism without acute cor pulmonale: Secondary | ICD-10-CM | POA: Diagnosis not present

## 2021-10-30 DIAGNOSIS — Z7951 Long term (current) use of inhaled steroids: Secondary | ICD-10-CM | POA: Diagnosis not present

## 2021-10-30 DIAGNOSIS — F32A Depression, unspecified: Secondary | ICD-10-CM | POA: Diagnosis not present

## 2021-10-30 DIAGNOSIS — D693 Immune thrombocytopenic purpura: Secondary | ICD-10-CM | POA: Diagnosis not present

## 2021-10-30 DIAGNOSIS — R569 Unspecified convulsions: Secondary | ICD-10-CM | POA: Diagnosis not present

## 2021-10-30 DIAGNOSIS — I959 Hypotension, unspecified: Secondary | ICD-10-CM | POA: Diagnosis not present

## 2021-10-30 DIAGNOSIS — E1122 Type 2 diabetes mellitus with diabetic chronic kidney disease: Secondary | ICD-10-CM | POA: Diagnosis not present

## 2021-10-30 DIAGNOSIS — E039 Hypothyroidism, unspecified: Secondary | ICD-10-CM | POA: Diagnosis not present

## 2021-10-30 DIAGNOSIS — J9601 Acute respiratory failure with hypoxia: Secondary | ICD-10-CM | POA: Diagnosis not present

## 2021-10-30 DIAGNOSIS — Z6838 Body mass index (BMI) 38.0-38.9, adult: Secondary | ICD-10-CM | POA: Diagnosis not present

## 2021-10-30 DIAGNOSIS — N1831 Chronic kidney disease, stage 3a: Secondary | ICD-10-CM | POA: Diagnosis not present

## 2021-10-30 DIAGNOSIS — D631 Anemia in chronic kidney disease: Secondary | ICD-10-CM | POA: Diagnosis not present

## 2021-10-30 DIAGNOSIS — K900 Celiac disease: Secondary | ICD-10-CM | POA: Diagnosis not present

## 2021-10-30 DIAGNOSIS — E7800 Pure hypercholesterolemia, unspecified: Secondary | ICD-10-CM | POA: Diagnosis not present

## 2021-10-30 DIAGNOSIS — I5030 Unspecified diastolic (congestive) heart failure: Secondary | ICD-10-CM | POA: Diagnosis not present

## 2021-10-30 DIAGNOSIS — I110 Hypertensive heart disease with heart failure: Secondary | ICD-10-CM | POA: Diagnosis not present

## 2021-10-30 DIAGNOSIS — M170 Bilateral primary osteoarthritis of knee: Secondary | ICD-10-CM | POA: Diagnosis not present

## 2021-11-01 DIAGNOSIS — E05 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: Secondary | ICD-10-CM | POA: Diagnosis not present

## 2021-11-02 DIAGNOSIS — E05 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: Secondary | ICD-10-CM | POA: Diagnosis not present

## 2021-11-03 DIAGNOSIS — E119 Type 2 diabetes mellitus without complications: Secondary | ICD-10-CM | POA: Diagnosis not present

## 2021-11-03 DIAGNOSIS — H35033 Hypertensive retinopathy, bilateral: Secondary | ICD-10-CM | POA: Diagnosis not present

## 2021-11-03 DIAGNOSIS — Z8679 Personal history of other diseases of the circulatory system: Secondary | ICD-10-CM | POA: Diagnosis not present

## 2021-11-06 DIAGNOSIS — I5032 Chronic diastolic (congestive) heart failure: Secondary | ICD-10-CM | POA: Diagnosis not present

## 2021-11-06 DIAGNOSIS — E039 Hypothyroidism, unspecified: Secondary | ICD-10-CM | POA: Diagnosis not present

## 2021-11-06 DIAGNOSIS — Z6841 Body Mass Index (BMI) 40.0 and over, adult: Secondary | ICD-10-CM | POA: Diagnosis not present

## 2021-11-06 DIAGNOSIS — E7800 Pure hypercholesterolemia, unspecified: Secondary | ICD-10-CM | POA: Diagnosis not present

## 2021-11-06 DIAGNOSIS — D508 Other iron deficiency anemias: Secondary | ICD-10-CM | POA: Diagnosis not present

## 2021-11-06 DIAGNOSIS — E05 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: Secondary | ICD-10-CM | POA: Diagnosis not present

## 2021-11-06 DIAGNOSIS — G720 Drug-induced myopathy: Secondary | ICD-10-CM | POA: Diagnosis not present

## 2021-11-07 ENCOUNTER — Ambulatory Visit: Payer: Medicare HMO | Admitting: Cardiovascular Disease

## 2021-11-07 DIAGNOSIS — I2699 Other pulmonary embolism without acute cor pulmonale: Secondary | ICD-10-CM | POA: Diagnosis not present

## 2021-11-07 DIAGNOSIS — E039 Hypothyroidism, unspecified: Secondary | ICD-10-CM | POA: Diagnosis not present

## 2021-11-07 DIAGNOSIS — I959 Hypotension, unspecified: Secondary | ICD-10-CM | POA: Diagnosis not present

## 2021-11-07 DIAGNOSIS — J9601 Acute respiratory failure with hypoxia: Secondary | ICD-10-CM | POA: Diagnosis not present

## 2021-11-07 DIAGNOSIS — Z6838 Body mass index (BMI) 38.0-38.9, adult: Secondary | ICD-10-CM | POA: Diagnosis not present

## 2021-11-07 DIAGNOSIS — D693 Immune thrombocytopenic purpura: Secondary | ICD-10-CM | POA: Diagnosis not present

## 2021-11-07 DIAGNOSIS — R569 Unspecified convulsions: Secondary | ICD-10-CM | POA: Diagnosis not present

## 2021-11-07 DIAGNOSIS — N1831 Chronic kidney disease, stage 3a: Secondary | ICD-10-CM | POA: Diagnosis not present

## 2021-11-07 DIAGNOSIS — D631 Anemia in chronic kidney disease: Secondary | ICD-10-CM | POA: Diagnosis not present

## 2021-11-07 DIAGNOSIS — K900 Celiac disease: Secondary | ICD-10-CM | POA: Diagnosis not present

## 2021-11-07 DIAGNOSIS — E1122 Type 2 diabetes mellitus with diabetic chronic kidney disease: Secondary | ICD-10-CM | POA: Diagnosis not present

## 2021-11-07 DIAGNOSIS — Z7951 Long term (current) use of inhaled steroids: Secondary | ICD-10-CM | POA: Diagnosis not present

## 2021-11-07 DIAGNOSIS — E876 Hypokalemia: Secondary | ICD-10-CM | POA: Diagnosis not present

## 2021-11-07 DIAGNOSIS — I5030 Unspecified diastolic (congestive) heart failure: Secondary | ICD-10-CM | POA: Diagnosis not present

## 2021-11-07 DIAGNOSIS — I110 Hypertensive heart disease with heart failure: Secondary | ICD-10-CM | POA: Diagnosis not present

## 2021-11-07 DIAGNOSIS — M170 Bilateral primary osteoarthritis of knee: Secondary | ICD-10-CM | POA: Diagnosis not present

## 2021-11-07 DIAGNOSIS — E7800 Pure hypercholesterolemia, unspecified: Secondary | ICD-10-CM | POA: Diagnosis not present

## 2021-11-07 DIAGNOSIS — F32A Depression, unspecified: Secondary | ICD-10-CM | POA: Diagnosis not present

## 2021-11-07 DIAGNOSIS — K5780 Diverticulitis of intestine, part unspecified, with perforation and abscess without bleeding: Secondary | ICD-10-CM | POA: Diagnosis not present

## 2021-11-15 DIAGNOSIS — J9601 Acute respiratory failure with hypoxia: Secondary | ICD-10-CM | POA: Diagnosis not present

## 2021-11-15 DIAGNOSIS — M170 Bilateral primary osteoarthritis of knee: Secondary | ICD-10-CM | POA: Diagnosis not present

## 2021-11-15 DIAGNOSIS — F32A Depression, unspecified: Secondary | ICD-10-CM | POA: Diagnosis not present

## 2021-11-15 DIAGNOSIS — D631 Anemia in chronic kidney disease: Secondary | ICD-10-CM | POA: Diagnosis not present

## 2021-11-15 DIAGNOSIS — Z6838 Body mass index (BMI) 38.0-38.9, adult: Secondary | ICD-10-CM | POA: Diagnosis not present

## 2021-11-15 DIAGNOSIS — I2699 Other pulmonary embolism without acute cor pulmonale: Secondary | ICD-10-CM | POA: Diagnosis not present

## 2021-11-15 DIAGNOSIS — K900 Celiac disease: Secondary | ICD-10-CM | POA: Diagnosis not present

## 2021-11-15 DIAGNOSIS — E876 Hypokalemia: Secondary | ICD-10-CM | POA: Diagnosis not present

## 2021-11-15 DIAGNOSIS — E039 Hypothyroidism, unspecified: Secondary | ICD-10-CM | POA: Diagnosis not present

## 2021-11-15 DIAGNOSIS — E1122 Type 2 diabetes mellitus with diabetic chronic kidney disease: Secondary | ICD-10-CM | POA: Diagnosis not present

## 2021-11-15 DIAGNOSIS — I5030 Unspecified diastolic (congestive) heart failure: Secondary | ICD-10-CM | POA: Diagnosis not present

## 2021-11-15 DIAGNOSIS — R569 Unspecified convulsions: Secondary | ICD-10-CM | POA: Diagnosis not present

## 2021-11-15 DIAGNOSIS — D693 Immune thrombocytopenic purpura: Secondary | ICD-10-CM | POA: Diagnosis not present

## 2021-11-15 DIAGNOSIS — Z7951 Long term (current) use of inhaled steroids: Secondary | ICD-10-CM | POA: Diagnosis not present

## 2021-11-15 DIAGNOSIS — E7800 Pure hypercholesterolemia, unspecified: Secondary | ICD-10-CM | POA: Diagnosis not present

## 2021-11-15 DIAGNOSIS — I959 Hypotension, unspecified: Secondary | ICD-10-CM | POA: Diagnosis not present

## 2021-11-15 DIAGNOSIS — K5780 Diverticulitis of intestine, part unspecified, with perforation and abscess without bleeding: Secondary | ICD-10-CM | POA: Diagnosis not present

## 2021-11-15 DIAGNOSIS — I110 Hypertensive heart disease with heart failure: Secondary | ICD-10-CM | POA: Diagnosis not present

## 2021-11-15 DIAGNOSIS — N1831 Chronic kidney disease, stage 3a: Secondary | ICD-10-CM | POA: Diagnosis not present

## 2021-11-20 ENCOUNTER — Ambulatory Visit: Payer: Medicare HMO | Admitting: Cardiovascular Disease

## 2021-11-20 DIAGNOSIS — E669 Obesity, unspecified: Secondary | ICD-10-CM | POA: Diagnosis not present

## 2021-11-20 DIAGNOSIS — Z6831 Body mass index (BMI) 31.0-31.9, adult: Secondary | ICD-10-CM | POA: Diagnosis not present

## 2021-11-20 DIAGNOSIS — E039 Hypothyroidism, unspecified: Secondary | ICD-10-CM | POA: Diagnosis not present

## 2021-11-20 DIAGNOSIS — E119 Type 2 diabetes mellitus without complications: Secondary | ICD-10-CM | POA: Diagnosis not present

## 2021-11-23 DIAGNOSIS — E05 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: Secondary | ICD-10-CM | POA: Diagnosis not present

## 2021-12-16 ENCOUNTER — Other Ambulatory Visit: Payer: Self-pay | Admitting: Cardiovascular Disease

## 2021-12-17 ENCOUNTER — Other Ambulatory Visit: Payer: Self-pay | Admitting: Cardiovascular Disease

## 2021-12-19 ENCOUNTER — Other Ambulatory Visit: Payer: Self-pay

## 2021-12-19 MED ORDER — ROSUVASTATIN CALCIUM 5 MG PO TABS: 5.0000 mg | ORAL_TABLET | Freq: Every day | ORAL | 0 refills | Status: DC

## 2021-12-27 ENCOUNTER — Encounter: Payer: Self-pay | Admitting: Cardiovascular Disease

## 2021-12-27 NOTE — Progress Notes (Signed)
[name] Date of Birth  February 06, 1951 [facility]        [address]    [place]     [place], [place]  [address]      [phone]  Fax  [phone]       Problem list: 1. Hypertension 2. Diastolic congestive heart failure 3. Hypothyroid 4. Obesity  5. History of seizures  [name] is a 71 year old female who presented to our office last month with shortness of breath. We started her on Lasix and potassium.  Feels well preserved left ventricular systolic function. She does have mild left ventricular hypertrophy. Her ejection fraction is around 60%.  She is feeling quite a bit better on the Lasix.  A BMP drawn at the time of her echocardiogram reveals that her potassium is 3.8. Her other electrolytes are okay to  She has had some GI bleeding She was found to have some hemorrhoids injected.  July 17, 2012: Pt is doing well.  She has had some lightheadedness and was reduce her Lasix. She now takes a half a tablet Lasix every third or fourth day a week. Otherwise she seems to be feeling fairly well.  May 25, 2013:  She is still very short of breath with exertion.  She has continued to gain weight.  She eats lots of breath.    06/21/2014:  She continues to have problems with weight.   She has borderline diabetes.  No CP, no dyspnea  June 27, 2015:  Was recently diagnosed with possible  Graves disease  ( according to eye doctor )  Breathing has been ok. Does not walk regularly   Nov. 3, 2017:  No CP , has some DOE  Has some wheezing  Has gained some weight .   Has mild leg swelling   March 19, 2018: [name] is seen today for follow-up of her hypertension, chronic diastolic congestive heart failure and morbid obesity.  She was last seen in November, 2017.  Has DOE walking into the office. Has lost some weight.   10 lbs weight loss since Nov. 2018.    Has leg pain at night - no claudication pain with walking ( she asked about our PAD poster)  Primary MD  is managing her lipids   Aug. 19, 2020   Seen for follow of of chronic diastolic CHF.   BP is well controlled.  Is on a new med for her Graves disease.  Wt is 290 today  ( up 12 lbs from last )   Nov. 22, 2021 [name] is seen for follow up of her chronic diastolic CHF Wt. Today is 269. - down 21 lbs from last year  BP is well controlled.   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 12, 2023 [name] is seen for follow up visit of her diastoic CHF  Wt is 197 lbs ( down 74 lbs since last year)  She lost 60+ lbs during an acute illness ( diverticulitis)   No CP , no dyspnea  BP at home is well controlled.     Current Outpatient Medications on File Prior to Visit  Medication Sig Dispense Refill   ECHINACEA PO Take by mouth.     furosemide (LASIX) 20 MG tablet Take 1 tablet (20 mg total) by mouth daily. (Patient taking differently: Take 40 mg by mouth daily as needed.) 7 tablet 0   levothyroxine (  SYNTHROID) 112 MCG tablet Take 1 tablet by mouth Monday through Saturday and 1/2 tablet on Sunday. 30 tablet 3   rosuvastatin (CRESTOR) 5 MG tablet Take 1 tablet (5 mg total) by mouth daily. 30 tablet 0   acetaminophen (TYLENOL) 325 MG tablet Take 650 mg by mouth every 4 (four) hours as needed for fever. (Patient not taking: Reported on 12/28/2021)     albuterol (VENTOLIN HFA) 108 (90 Base) MCG/ACT inhaler Inhale 2 puffs into the lungs every 6 (six) hours as needed for wheezing or shortness of breath. (Patient not taking: Reported on 12/28/2021)     feeding supplement (ENSURE ENLIVE / ENSURE PLUS) LIQD Take 237 mLs by mouth 2 (two) times daily between meals. (Patient not taking: Reported on 07/01/2021) 237 mL 12   Ferrous Sulfate (IRON) 325 (65 Fe) MG TABS 1 tablet     Fluticasone-Salmeterol (ADVAIR) 250-50 MCG/DOSE AEPB Inhale 1 puff into the lungs daily as needed for allergies. (Patient not taking: Reported on 12/28/2021)     glucose blood  (ONETOUCH VERIO) test strip Use as intstructed to check blood sugar once a day Dx Code E11.9 (Patient not taking: Reported on 07/01/2021) 100 strip 12   Lancets (ONETOUCH ULTRASOFT) lancets Use as instructed (Patient not taking: Reported on 07/01/2021) 100 each 12   levETIRAcetam (KEPPRA) 500 MG tablet Take 1 tablet (500 mg total) by mouth 2 (two) times daily. (Patient not taking: Reported on 12/28/2021)     meclizine (ANTIVERT) 25 MG tablet Take 1 tablet by mouth daily as needed for dizziness. (Patient not taking: Reported on 12/28/2021)     Multiple Vitamin (MULTIVITAMIN WITH MINERALS) TABS tablet Take 1 tablet by mouth daily. (Patient not taking: Reported on 12/28/2021)     ondansetron (ZOFRAN) 4 MG tablet Take 4 mg by mouth every 8 (eight) hours as needed for nausea or vomiting. (Patient not taking: Reported on 12/28/2021)     OVER THE COUNTER MEDICATION Take 1 tablet by mouth daily. Medication: Vitamin b12 3000 mcg (Patient not taking: Reported on 12/28/2021)     pantoprazole (PROTONIX) 40 MG tablet Take 40 mg by mouth 2 (two) times daily. (Patient not taking: Reported on 12/28/2021)     polyvinyl alcohol (LIQUIFILM TEARS) 1.4 % ophthalmic solution Place 1 drop into both eyes as needed for dry eyes. (Patient not taking: Reported on 12/28/2021)     saccharomyces boulardii (FLORASTOR) 250 MG capsule Take 1 capsule (250 mg total) by mouth 2 (two) times daily. (Patient not taking: Reported on 12/28/2021)     No current facility-administered medications on file prior to visit.    Allergies  Allergen Reactions   Atorvastatin     Other reaction(s): ?muscle aches   Gluten Meal     Celiac    Past Medical History:  Diagnosis Date   Anemia    Celiac disease    CHF (congestive heart failure) (HCC)    Depression    Diabetes mellitus without complication ([place])    Edema    lower extremities   Emphysema of lung ([place])    Enlarged heart    begining stage   Graves disease    Headache(784.0) 05/18/2013    Heart murmur    Hematochezia    Hemorrhoid    Hyperlipidemia    Hypertension    Migraine    Obese    OSA (obstructive sleep apnea)    Seizures ([place])    Sleep apnea    Stroke [facility])    left P-O ICH 2007  Thyroid disease    hypothyroidism   Ulcer    Vitamin D deficiency     Past Surgical History:  Procedure Laterality Date   CESAREAN SECTION     ESOPHAGOGASTRODUODENOSCOPY N/A 06/14/2021   Procedure: ESOPHAGOGASTRODUODENOSCOPY (EGD);  Surgeon: Carol Ada, MD;  Location: [place];  Service: Endoscopy;  Laterality: N/A;   ESOPHAGOGASTRODUODENOSCOPY (EGD) WITH PROPOFOL N/A 06/01/2021   Procedure: ESOPHAGOGASTRODUODENOSCOPY (EGD) WITH PROPOFOL;  Surgeon: Carol Ada, MD;  Location: [facility];  Service: Endoscopy;  Laterality: N/A;   HEMORRHOID SURGERY     IR IVC FILTER PLMT / S&I /IMG GUID/MOD SED  06/01/2021   IR SINUS/FIST TUBE CHK-NON GI  06/14/2021   IR SINUS/FIST TUBE CHK-NON GI  07/03/2021   IR US GUIDE VASC ACCESS RIGHT  06/01/2021   IUD REMOVAL     TONSILLECTOMY     VEIN SURGERY      Social History   Tobacco Use  Smoking Status Never  Smokeless Tobacco Never    Social History   Substance and Sexual Activity  Alcohol Use No    Family History  Problem Relation Age of Onset   Heart disease Father    Diabetes Father    Heart failure Father    Diabetes Mother    Thyroid disease Sister    Diabetes Brother     Reviw of Systems:  Reviewed in the HPI.  All other systems are negative.   Physical Exam: Blood pressure (!) 146/82, pulse 80, weight 197 lb 9.6 oz (89.6 kg), SpO2 99 %.  GEN:  Well nourished, well developed in no acute distress HEENT: Normal NECK: No JVD; No carotid bruits LYMPHATICS: No lymphadenopathy CARDIAC: RRR  . Soft systolic murmur  RESPIRATORY:  Clear to auscultation without rales, wheezing or rhonchi  ABDOMEN: Soft, non-tender, non-distended MUSCULOSKELETAL:  No edema; No deformity  SKIN: Warm and dry NEUROLOGIC:  Alert and  oriented x 3    ECG:    Assessment / Plan:   1. Hypertension- BP is well controlled at home    2. Diastolic congestive heart failure-     Stable ,  takes Lasix as needed  She seems to be getting better .   3. Hypothyroid- cont synthroid , followed by her primary md   4. Obesity -   has lost over 70 lbs since I last saw her.      Mertie Moores, MD  12/28/2021 8:35 AM    [facility] [place],  [place], [place]  [address] Pager [phone] Phone: [phone]; Fax: [phone]

## 2021-12-28 ENCOUNTER — Ambulatory Visit: Payer: Medicare HMO | Admitting: Cardiovascular Disease

## 2021-12-28 ENCOUNTER — Other Ambulatory Visit: Payer: Self-pay

## 2021-12-28 ENCOUNTER — Encounter: Payer: Self-pay | Admitting: Cardiovascular Disease

## 2021-12-28 VITALS — BP 146/82 | HR 80 | Wt 197.6 lb

## 2021-12-28 DIAGNOSIS — I10 Essential (primary) hypertension: Secondary | ICD-10-CM | POA: Diagnosis not present

## 2021-12-28 DIAGNOSIS — I5032 Chronic diastolic (congestive) heart failure: Secondary | ICD-10-CM

## 2021-12-28 NOTE — Patient Instructions (Addendum)
Medication Instructions:  °Your physician recommends that you continue on your current medications as directed. Please refer to the Current Medication list given to you today. ° °*If you need a refill on your cardiac medications before your next appointment, please call your pharmacy* ° °Lab Work: °If you have labs (blood work) drawn today and your tests are completely normal, you will receive your results only by: °MyChart Message (if you have MyChart) OR °A paper copy in the mail °If you have any lab test that is abnormal or we need to change your treatment, we will call you to review the results. ° °Testing/Procedures: °None ordered today. ° °Follow-Up: °At [facility], you and your health needs are our priority.  As part of our continuing mission to provide you with exceptional heart care, we have created designated Provider Care Teams.  These Care Teams include your primary Cardiologist (physician) and Advanced Practice Providers (APPs -  Physician Assistants and Nurse Practitioners) who all work together to provide you with the care you need, when you need it. ° °We recommend signing up for the patient portal called "MyChart".  Sign up information is provided on this After Visit Summary.  MyChart is used to connect with patients for Virtual Visits (Telemedicine).  Patients are able to view lab/test results, encounter notes, upcoming appointments, etc.  Non-urgent messages can be sent to your provider as well.   °To learn more about what you can do with MyChart, go to [email or web address].   ° °Your next appointment:   °12 month(s) ° °The format for your next appointment:   °In Person ° °Provider:   °Philip Nahser, MD  or Michelle Swinyer, NP or Scott Weaver, PA-C      ° ° ° °

## 2021-12-29 DIAGNOSIS — E05 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: Secondary | ICD-10-CM | POA: Diagnosis not present

## 2022-01-05 ENCOUNTER — Other Ambulatory Visit: Payer: Self-pay | Admitting: Cardiovascular Disease

## 2022-01-08 ENCOUNTER — Ambulatory Visit: Payer: Medicare HMO | Admitting: Cardiovascular Disease

## 2022-01-08 DIAGNOSIS — E559 Vitamin D deficiency, unspecified: Secondary | ICD-10-CM | POA: Diagnosis not present

## 2022-01-08 DIAGNOSIS — I5032 Chronic diastolic (congestive) heart failure: Secondary | ICD-10-CM | POA: Diagnosis not present

## 2022-01-08 DIAGNOSIS — I700 Atherosclerosis of aorta: Secondary | ICD-10-CM | POA: Diagnosis not present

## 2022-01-08 DIAGNOSIS — Z0000 Encounter for general adult medical examination without abnormal findings: Secondary | ICD-10-CM | POA: Diagnosis not present

## 2022-01-08 DIAGNOSIS — K219 Gastro-esophageal reflux disease without esophagitis: Secondary | ICD-10-CM | POA: Diagnosis not present

## 2022-01-08 DIAGNOSIS — E05 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: Secondary | ICD-10-CM | POA: Diagnosis not present

## 2022-01-08 DIAGNOSIS — E7800 Pure hypercholesterolemia, unspecified: Secondary | ICD-10-CM | POA: Diagnosis not present

## 2022-01-08 DIAGNOSIS — D508 Other iron deficiency anemias: Secondary | ICD-10-CM | POA: Diagnosis not present

## 2022-01-08 DIAGNOSIS — E039 Hypothyroidism, unspecified: Secondary | ICD-10-CM | POA: Diagnosis not present

## 2022-01-10 DIAGNOSIS — J4540 Moderate persistent asthma, uncomplicated: Secondary | ICD-10-CM | POA: Diagnosis not present

## 2022-01-10 DIAGNOSIS — I700 Atherosclerosis of aorta: Secondary | ICD-10-CM | POA: Diagnosis not present

## 2022-01-10 DIAGNOSIS — N1831 Chronic kidney disease, stage 3a: Secondary | ICD-10-CM | POA: Diagnosis not present

## 2022-01-10 DIAGNOSIS — E039 Hypothyroidism, unspecified: Secondary | ICD-10-CM | POA: Diagnosis not present

## 2022-01-10 DIAGNOSIS — M170 Bilateral primary osteoarthritis of knee: Secondary | ICD-10-CM | POA: Diagnosis not present

## 2022-01-10 DIAGNOSIS — E1151 Type 2 diabetes mellitus with diabetic peripheral angiopathy without gangrene: Secondary | ICD-10-CM | POA: Diagnosis not present

## 2022-01-10 DIAGNOSIS — E05 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: Secondary | ICD-10-CM | POA: Diagnosis not present

## 2022-01-10 DIAGNOSIS — I130 Hypertensive heart and chronic kidney disease with heart failure and stage 1 through stage 4 chronic kidney disease, or unspecified chronic kidney disease: Secondary | ICD-10-CM | POA: Diagnosis not present

## 2022-01-10 DIAGNOSIS — E1122 Type 2 diabetes mellitus with diabetic chronic kidney disease: Secondary | ICD-10-CM | POA: Diagnosis not present

## 2022-01-10 DIAGNOSIS — I5032 Chronic diastolic (congestive) heart failure: Secondary | ICD-10-CM | POA: Diagnosis not present

## 2022-01-10 DIAGNOSIS — R5381 Other malaise: Secondary | ICD-10-CM | POA: Diagnosis not present

## 2022-01-10 DIAGNOSIS — D508 Other iron deficiency anemias: Secondary | ICD-10-CM | POA: Diagnosis not present

## 2022-01-17 ENCOUNTER — Other Ambulatory Visit: Payer: Self-pay | Admitting: Internal Medicine

## 2022-01-17 DIAGNOSIS — Z13820 Encounter for screening for osteoporosis: Secondary | ICD-10-CM

## 2022-01-18 DIAGNOSIS — I130 Hypertensive heart and chronic kidney disease with heart failure and stage 1 through stage 4 chronic kidney disease, or unspecified chronic kidney disease: Secondary | ICD-10-CM | POA: Diagnosis not present

## 2022-01-18 DIAGNOSIS — D508 Other iron deficiency anemias: Secondary | ICD-10-CM | POA: Diagnosis not present

## 2022-01-18 DIAGNOSIS — J4540 Moderate persistent asthma, uncomplicated: Secondary | ICD-10-CM | POA: Diagnosis not present

## 2022-01-18 DIAGNOSIS — R5381 Other malaise: Secondary | ICD-10-CM | POA: Diagnosis not present

## 2022-01-18 DIAGNOSIS — E05 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: Secondary | ICD-10-CM | POA: Diagnosis not present

## 2022-01-18 DIAGNOSIS — M170 Bilateral primary osteoarthritis of knee: Secondary | ICD-10-CM | POA: Diagnosis not present

## 2022-01-18 DIAGNOSIS — N1831 Chronic kidney disease, stage 3a: Secondary | ICD-10-CM | POA: Diagnosis not present

## 2022-01-18 DIAGNOSIS — E039 Hypothyroidism, unspecified: Secondary | ICD-10-CM | POA: Diagnosis not present

## 2022-01-18 DIAGNOSIS — I5032 Chronic diastolic (congestive) heart failure: Secondary | ICD-10-CM | POA: Diagnosis not present

## 2022-01-18 DIAGNOSIS — E1122 Type 2 diabetes mellitus with diabetic chronic kidney disease: Secondary | ICD-10-CM | POA: Diagnosis not present

## 2022-01-22 DIAGNOSIS — L298 Other pruritus: Secondary | ICD-10-CM | POA: Diagnosis not present

## 2022-01-29 ENCOUNTER — Encounter: Payer: Self-pay | Admitting: Podiatry

## 2022-01-29 ENCOUNTER — Other Ambulatory Visit: Payer: Self-pay

## 2022-01-29 ENCOUNTER — Ambulatory Visit: Payer: Medicare HMO | Admitting: Podiatry

## 2022-01-29 DIAGNOSIS — E1122 Type 2 diabetes mellitus with diabetic chronic kidney disease: Secondary | ICD-10-CM

## 2022-01-29 DIAGNOSIS — N1831 Chronic kidney disease, stage 3a: Secondary | ICD-10-CM | POA: Diagnosis not present

## 2022-01-29 DIAGNOSIS — G629 Polyneuropathy, unspecified: Secondary | ICD-10-CM

## 2022-01-29 DIAGNOSIS — L600 Ingrowing nail: Secondary | ICD-10-CM | POA: Diagnosis not present

## 2022-01-29 NOTE — Progress Notes (Signed)
°  Subjective:  Patient ID: [name], female    DOB: [birth year],   MRN: [id number]  Chief Complaint  Patient presents with   Nail Problem    Nail trim     71 y.o. female presents for concern of ingrown toenails as well as tingling in the feet. Relates she gets tingling throughout the day and occasional numbness. She is not diabetic relates she did have a temporary peak in blood sugar on prednisone and that's why it is listed in her chart.  . Denies any other pedal complaints. Denies n/v/f/c.   Past Medical History:  Diagnosis Date   Anemia    Celiac disease    CHF (congestive heart failure) ([place])    Depression    Diabetes mellitus without complication ([place])    Edema    lower extremities   Emphysema of lung ([place])    Enlarged heart    begining stage   Graves disease    Headache(784.0) 05/18/2013   Heart murmur    Hematochezia    Hemorrhoid    Hyperlipidemia    Hypertension    Migraine    Obese    OSA (obstructive sleep apnea)    Seizures ([place])    Sleep apnea    Stroke ([place])    left P-O ICH 2007   Thyroid disease    hypothyroidism   Ulcer    Vitamin D deficiency     Objective:  Physical Exam: Vascular: DP/PT pulses 2/4 bilateral. CFT <3 seconds. Normal hair growth on digits. No edema.  Skin. No lacerations or abrasions bilateral feet. Incurvation of bilateral borders of bilateral hallux nails. Appear thickened.  Musculoskeletal: MMT 5/5 bilateral lower extremities in DF, PF, Inversion and Eversion. Deceased ROM in DF of ankle joint.  Neurological: Sensation intact to light touch.   Assessment:   1. Peripheral polyneuropathy   2. Type 2 diabetes mellitus with stage 3a chronic kidney disease, without long-term current use of insulin ([place])   3. Ingrown nail of great toe of right foot   4. Ingrown nail of great toe of left foot      Plan:  Patient was evaluated and treated and all questions answered. Discussed neuropathy and etiology as well as treatment  with patient.  Radiographs reviewed and discussed with patient.  -Discussed and educated patient on  foot care, especially with  regards to the vascular, neurological and musculoskeletal systems.  -Discussed supportive shoes at all times and checking feet regularly.  -Follow-up with PCP for prescription management of gabapentin. As well as to discuss back pain and potentially causing foot referred pain.  -Patient to try OTC nerve creams.  -Hallux nails were debrided in slant back fashion as courtesy today.  Patient to return as needed.    Lorenda Peck, DPM

## 2022-01-31 DIAGNOSIS — I10 Essential (primary) hypertension: Secondary | ICD-10-CM | POA: Diagnosis not present

## 2022-01-31 DIAGNOSIS — I5032 Chronic diastolic (congestive) heart failure: Secondary | ICD-10-CM | POA: Diagnosis not present

## 2022-01-31 DIAGNOSIS — E559 Vitamin D deficiency, unspecified: Secondary | ICD-10-CM | POA: Diagnosis not present

## 2022-01-31 DIAGNOSIS — N951 Menopausal and female climacteric states: Secondary | ICD-10-CM | POA: Diagnosis not present

## 2022-01-31 DIAGNOSIS — I130 Hypertensive heart and chronic kidney disease with heart failure and stage 1 through stage 4 chronic kidney disease, or unspecified chronic kidney disease: Secondary | ICD-10-CM | POA: Diagnosis not present

## 2022-01-31 DIAGNOSIS — E039 Hypothyroidism, unspecified: Secondary | ICD-10-CM | POA: Diagnosis not present

## 2022-01-31 DIAGNOSIS — N189 Chronic kidney disease, unspecified: Secondary | ICD-10-CM | POA: Diagnosis not present

## 2022-01-31 DIAGNOSIS — R69 Illness, unspecified: Secondary | ICD-10-CM | POA: Diagnosis not present

## 2022-01-31 DIAGNOSIS — M170 Bilateral primary osteoarthritis of knee: Secondary | ICD-10-CM | POA: Diagnosis not present

## 2022-02-05 DIAGNOSIS — I130 Hypertensive heart and chronic kidney disease with heart failure and stage 1 through stage 4 chronic kidney disease, or unspecified chronic kidney disease: Secondary | ICD-10-CM | POA: Diagnosis not present

## 2022-02-05 DIAGNOSIS — N951 Menopausal and female climacteric states: Secondary | ICD-10-CM | POA: Diagnosis not present

## 2022-02-05 DIAGNOSIS — I5032 Chronic diastolic (congestive) heart failure: Secondary | ICD-10-CM | POA: Diagnosis not present

## 2022-02-05 DIAGNOSIS — E039 Hypothyroidism, unspecified: Secondary | ICD-10-CM | POA: Diagnosis not present

## 2022-02-05 DIAGNOSIS — E559 Vitamin D deficiency, unspecified: Secondary | ICD-10-CM | POA: Diagnosis not present

## 2022-02-05 DIAGNOSIS — R69 Illness, unspecified: Secondary | ICD-10-CM | POA: Diagnosis not present

## 2022-02-05 DIAGNOSIS — N189 Chronic kidney disease, unspecified: Secondary | ICD-10-CM | POA: Diagnosis not present

## 2022-02-05 DIAGNOSIS — M170 Bilateral primary osteoarthritis of knee: Secondary | ICD-10-CM | POA: Diagnosis not present

## 2022-02-07 DIAGNOSIS — E039 Hypothyroidism, unspecified: Secondary | ICD-10-CM | POA: Diagnosis not present

## 2022-02-08 DIAGNOSIS — R69 Illness, unspecified: Secondary | ICD-10-CM | POA: Diagnosis not present

## 2022-02-08 DIAGNOSIS — M170 Bilateral primary osteoarthritis of knee: Secondary | ICD-10-CM | POA: Diagnosis not present

## 2022-02-08 DIAGNOSIS — E039 Hypothyroidism, unspecified: Secondary | ICD-10-CM | POA: Diagnosis not present

## 2022-02-08 DIAGNOSIS — N951 Menopausal and female climacteric states: Secondary | ICD-10-CM | POA: Diagnosis not present

## 2022-02-08 DIAGNOSIS — I5032 Chronic diastolic (congestive) heart failure: Secondary | ICD-10-CM | POA: Diagnosis not present

## 2022-02-08 DIAGNOSIS — E559 Vitamin D deficiency, unspecified: Secondary | ICD-10-CM | POA: Diagnosis not present

## 2022-02-08 DIAGNOSIS — I130 Hypertensive heart and chronic kidney disease with heart failure and stage 1 through stage 4 chronic kidney disease, or unspecified chronic kidney disease: Secondary | ICD-10-CM | POA: Diagnosis not present

## 2022-02-08 DIAGNOSIS — N189 Chronic kidney disease, unspecified: Secondary | ICD-10-CM | POA: Diagnosis not present

## 2022-02-12 DIAGNOSIS — R69 Illness, unspecified: Secondary | ICD-10-CM | POA: Diagnosis not present

## 2022-02-12 DIAGNOSIS — I5032 Chronic diastolic (congestive) heart failure: Secondary | ICD-10-CM | POA: Diagnosis not present

## 2022-02-12 DIAGNOSIS — N951 Menopausal and female climacteric states: Secondary | ICD-10-CM | POA: Diagnosis not present

## 2022-02-12 DIAGNOSIS — M170 Bilateral primary osteoarthritis of knee: Secondary | ICD-10-CM | POA: Diagnosis not present

## 2022-02-12 DIAGNOSIS — E039 Hypothyroidism, unspecified: Secondary | ICD-10-CM | POA: Diagnosis not present

## 2022-02-12 DIAGNOSIS — E559 Vitamin D deficiency, unspecified: Secondary | ICD-10-CM | POA: Diagnosis not present

## 2022-02-12 DIAGNOSIS — I130 Hypertensive heart and chronic kidney disease with heart failure and stage 1 through stage 4 chronic kidney disease, or unspecified chronic kidney disease: Secondary | ICD-10-CM | POA: Diagnosis not present

## 2022-02-12 DIAGNOSIS — N189 Chronic kidney disease, unspecified: Secondary | ICD-10-CM | POA: Diagnosis not present

## 2022-02-14 DIAGNOSIS — I10 Essential (primary) hypertension: Secondary | ICD-10-CM | POA: Diagnosis not present

## 2022-02-15 DIAGNOSIS — N189 Chronic kidney disease, unspecified: Secondary | ICD-10-CM | POA: Diagnosis not present

## 2022-02-15 DIAGNOSIS — I130 Hypertensive heart and chronic kidney disease with heart failure and stage 1 through stage 4 chronic kidney disease, or unspecified chronic kidney disease: Secondary | ICD-10-CM | POA: Diagnosis not present

## 2022-02-15 DIAGNOSIS — M170 Bilateral primary osteoarthritis of knee: Secondary | ICD-10-CM | POA: Diagnosis not present

## 2022-02-15 DIAGNOSIS — I5032 Chronic diastolic (congestive) heart failure: Secondary | ICD-10-CM | POA: Diagnosis not present

## 2022-02-15 DIAGNOSIS — E039 Hypothyroidism, unspecified: Secondary | ICD-10-CM | POA: Diagnosis not present

## 2022-02-15 DIAGNOSIS — N951 Menopausal and female climacteric states: Secondary | ICD-10-CM | POA: Diagnosis not present

## 2022-02-15 DIAGNOSIS — E559 Vitamin D deficiency, unspecified: Secondary | ICD-10-CM | POA: Diagnosis not present

## 2022-02-15 DIAGNOSIS — R69 Illness, unspecified: Secondary | ICD-10-CM | POA: Diagnosis not present

## 2022-02-16 DIAGNOSIS — G629 Polyneuropathy, unspecified: Secondary | ICD-10-CM | POA: Diagnosis not present

## 2022-02-16 DIAGNOSIS — I10 Essential (primary) hypertension: Secondary | ICD-10-CM | POA: Diagnosis not present

## 2022-02-16 DIAGNOSIS — E039 Hypothyroidism, unspecified: Secondary | ICD-10-CM | POA: Diagnosis not present

## 2022-02-19 DIAGNOSIS — N189 Chronic kidney disease, unspecified: Secondary | ICD-10-CM | POA: Diagnosis not present

## 2022-02-19 DIAGNOSIS — I130 Hypertensive heart and chronic kidney disease with heart failure and stage 1 through stage 4 chronic kidney disease, or unspecified chronic kidney disease: Secondary | ICD-10-CM | POA: Diagnosis not present

## 2022-02-19 DIAGNOSIS — N951 Menopausal and female climacteric states: Secondary | ICD-10-CM | POA: Diagnosis not present

## 2022-02-19 DIAGNOSIS — E039 Hypothyroidism, unspecified: Secondary | ICD-10-CM | POA: Diagnosis not present

## 2022-02-19 DIAGNOSIS — M170 Bilateral primary osteoarthritis of knee: Secondary | ICD-10-CM | POA: Diagnosis not present

## 2022-02-19 DIAGNOSIS — R69 Illness, unspecified: Secondary | ICD-10-CM | POA: Diagnosis not present

## 2022-02-19 DIAGNOSIS — I5032 Chronic diastolic (congestive) heart failure: Secondary | ICD-10-CM | POA: Diagnosis not present

## 2022-02-19 DIAGNOSIS — E559 Vitamin D deficiency, unspecified: Secondary | ICD-10-CM | POA: Diagnosis not present

## 2022-02-22 DIAGNOSIS — I130 Hypertensive heart and chronic kidney disease with heart failure and stage 1 through stage 4 chronic kidney disease, or unspecified chronic kidney disease: Secondary | ICD-10-CM | POA: Diagnosis not present

## 2022-02-22 DIAGNOSIS — E039 Hypothyroidism, unspecified: Secondary | ICD-10-CM | POA: Diagnosis not present

## 2022-02-22 DIAGNOSIS — N189 Chronic kidney disease, unspecified: Secondary | ICD-10-CM | POA: Diagnosis not present

## 2022-02-22 DIAGNOSIS — M170 Bilateral primary osteoarthritis of knee: Secondary | ICD-10-CM | POA: Diagnosis not present

## 2022-02-22 DIAGNOSIS — I5032 Chronic diastolic (congestive) heart failure: Secondary | ICD-10-CM | POA: Diagnosis not present

## 2022-02-22 DIAGNOSIS — R69 Illness, unspecified: Secondary | ICD-10-CM | POA: Diagnosis not present

## 2022-02-22 DIAGNOSIS — E559 Vitamin D deficiency, unspecified: Secondary | ICD-10-CM | POA: Diagnosis not present

## 2022-02-22 DIAGNOSIS — N951 Menopausal and female climacteric states: Secondary | ICD-10-CM | POA: Diagnosis not present

## 2022-02-27 DIAGNOSIS — E559 Vitamin D deficiency, unspecified: Secondary | ICD-10-CM | POA: Diagnosis not present

## 2022-02-27 DIAGNOSIS — I5032 Chronic diastolic (congestive) heart failure: Secondary | ICD-10-CM | POA: Diagnosis not present

## 2022-02-27 DIAGNOSIS — N951 Menopausal and female climacteric states: Secondary | ICD-10-CM | POA: Diagnosis not present

## 2022-02-27 DIAGNOSIS — R69 Illness, unspecified: Secondary | ICD-10-CM | POA: Diagnosis not present

## 2022-02-27 DIAGNOSIS — N189 Chronic kidney disease, unspecified: Secondary | ICD-10-CM | POA: Diagnosis not present

## 2022-02-27 DIAGNOSIS — I130 Hypertensive heart and chronic kidney disease with heart failure and stage 1 through stage 4 chronic kidney disease, or unspecified chronic kidney disease: Secondary | ICD-10-CM | POA: Diagnosis not present

## 2022-02-27 DIAGNOSIS — M170 Bilateral primary osteoarthritis of knee: Secondary | ICD-10-CM | POA: Diagnosis not present

## 2022-02-27 DIAGNOSIS — E039 Hypothyroidism, unspecified: Secondary | ICD-10-CM | POA: Diagnosis not present

## 2022-03-01 DIAGNOSIS — M170 Bilateral primary osteoarthritis of knee: Secondary | ICD-10-CM | POA: Diagnosis not present

## 2022-03-01 DIAGNOSIS — I5032 Chronic diastolic (congestive) heart failure: Secondary | ICD-10-CM | POA: Diagnosis not present

## 2022-03-01 DIAGNOSIS — I130 Hypertensive heart and chronic kidney disease with heart failure and stage 1 through stage 4 chronic kidney disease, or unspecified chronic kidney disease: Secondary | ICD-10-CM | POA: Diagnosis not present

## 2022-03-01 DIAGNOSIS — N189 Chronic kidney disease, unspecified: Secondary | ICD-10-CM | POA: Diagnosis not present

## 2022-03-01 DIAGNOSIS — R69 Illness, unspecified: Secondary | ICD-10-CM | POA: Diagnosis not present

## 2022-03-01 DIAGNOSIS — N951 Menopausal and female climacteric states: Secondary | ICD-10-CM | POA: Diagnosis not present

## 2022-03-01 DIAGNOSIS — E039 Hypothyroidism, unspecified: Secondary | ICD-10-CM | POA: Diagnosis not present

## 2022-03-01 DIAGNOSIS — E559 Vitamin D deficiency, unspecified: Secondary | ICD-10-CM | POA: Diagnosis not present

## 2022-03-05 DIAGNOSIS — E559 Vitamin D deficiency, unspecified: Secondary | ICD-10-CM | POA: Diagnosis not present

## 2022-03-05 DIAGNOSIS — N189 Chronic kidney disease, unspecified: Secondary | ICD-10-CM | POA: Diagnosis not present

## 2022-03-05 DIAGNOSIS — E039 Hypothyroidism, unspecified: Secondary | ICD-10-CM | POA: Diagnosis not present

## 2022-03-05 DIAGNOSIS — I130 Hypertensive heart and chronic kidney disease with heart failure and stage 1 through stage 4 chronic kidney disease, or unspecified chronic kidney disease: Secondary | ICD-10-CM | POA: Diagnosis not present

## 2022-03-05 DIAGNOSIS — N951 Menopausal and female climacteric states: Secondary | ICD-10-CM | POA: Diagnosis not present

## 2022-03-05 DIAGNOSIS — R69 Illness, unspecified: Secondary | ICD-10-CM | POA: Diagnosis not present

## 2022-03-05 DIAGNOSIS — I5032 Chronic diastolic (congestive) heart failure: Secondary | ICD-10-CM | POA: Diagnosis not present

## 2022-03-05 DIAGNOSIS — M170 Bilateral primary osteoarthritis of knee: Secondary | ICD-10-CM | POA: Diagnosis not present

## 2022-03-08 DIAGNOSIS — N951 Menopausal and female climacteric states: Secondary | ICD-10-CM | POA: Diagnosis not present

## 2022-03-08 DIAGNOSIS — E039 Hypothyroidism, unspecified: Secondary | ICD-10-CM | POA: Diagnosis not present

## 2022-03-08 DIAGNOSIS — I130 Hypertensive heart and chronic kidney disease with heart failure and stage 1 through stage 4 chronic kidney disease, or unspecified chronic kidney disease: Secondary | ICD-10-CM | POA: Diagnosis not present

## 2022-03-08 DIAGNOSIS — I5032 Chronic diastolic (congestive) heart failure: Secondary | ICD-10-CM | POA: Diagnosis not present

## 2022-03-08 DIAGNOSIS — N189 Chronic kidney disease, unspecified: Secondary | ICD-10-CM | POA: Diagnosis not present

## 2022-03-08 DIAGNOSIS — E559 Vitamin D deficiency, unspecified: Secondary | ICD-10-CM | POA: Diagnosis not present

## 2022-03-08 DIAGNOSIS — M170 Bilateral primary osteoarthritis of knee: Secondary | ICD-10-CM | POA: Diagnosis not present

## 2022-03-08 DIAGNOSIS — R69 Illness, unspecified: Secondary | ICD-10-CM | POA: Diagnosis not present

## 2022-03-15 DIAGNOSIS — M170 Bilateral primary osteoarthritis of knee: Secondary | ICD-10-CM | POA: Diagnosis not present

## 2022-03-15 DIAGNOSIS — N189 Chronic kidney disease, unspecified: Secondary | ICD-10-CM | POA: Diagnosis not present

## 2022-03-15 DIAGNOSIS — N951 Menopausal and female climacteric states: Secondary | ICD-10-CM | POA: Diagnosis not present

## 2022-03-15 DIAGNOSIS — Z8639 Personal history of other endocrine, nutritional and metabolic disease: Secondary | ICD-10-CM | POA: Diagnosis not present

## 2022-03-15 DIAGNOSIS — I130 Hypertensive heart and chronic kidney disease with heart failure and stage 1 through stage 4 chronic kidney disease, or unspecified chronic kidney disease: Secondary | ICD-10-CM | POA: Diagnosis not present

## 2022-03-15 DIAGNOSIS — I5032 Chronic diastolic (congestive) heart failure: Secondary | ICD-10-CM | POA: Diagnosis not present

## 2022-03-15 DIAGNOSIS — R69 Illness, unspecified: Secondary | ICD-10-CM | POA: Diagnosis not present

## 2022-03-15 DIAGNOSIS — E039 Hypothyroidism, unspecified: Secondary | ICD-10-CM | POA: Diagnosis not present

## 2022-03-15 DIAGNOSIS — E559 Vitamin D deficiency, unspecified: Secondary | ICD-10-CM | POA: Diagnosis not present

## 2022-03-16 DIAGNOSIS — N189 Chronic kidney disease, unspecified: Secondary | ICD-10-CM | POA: Diagnosis not present

## 2022-03-16 DIAGNOSIS — I5032 Chronic diastolic (congestive) heart failure: Secondary | ICD-10-CM | POA: Diagnosis not present

## 2022-03-16 DIAGNOSIS — E559 Vitamin D deficiency, unspecified: Secondary | ICD-10-CM | POA: Diagnosis not present

## 2022-03-16 DIAGNOSIS — E039 Hypothyroidism, unspecified: Secondary | ICD-10-CM | POA: Diagnosis not present

## 2022-03-16 DIAGNOSIS — M170 Bilateral primary osteoarthritis of knee: Secondary | ICD-10-CM | POA: Diagnosis not present

## 2022-03-16 DIAGNOSIS — R69 Illness, unspecified: Secondary | ICD-10-CM | POA: Diagnosis not present

## 2022-03-16 DIAGNOSIS — N951 Menopausal and female climacteric states: Secondary | ICD-10-CM | POA: Diagnosis not present

## 2022-03-16 DIAGNOSIS — I130 Hypertensive heart and chronic kidney disease with heart failure and stage 1 through stage 4 chronic kidney disease, or unspecified chronic kidney disease: Secondary | ICD-10-CM | POA: Diagnosis not present

## 2022-03-19 DIAGNOSIS — N189 Chronic kidney disease, unspecified: Secondary | ICD-10-CM | POA: Diagnosis not present

## 2022-03-19 DIAGNOSIS — I5032 Chronic diastolic (congestive) heart failure: Secondary | ICD-10-CM | POA: Diagnosis not present

## 2022-03-19 DIAGNOSIS — R69 Illness, unspecified: Secondary | ICD-10-CM | POA: Diagnosis not present

## 2022-03-19 DIAGNOSIS — E039 Hypothyroidism, unspecified: Secondary | ICD-10-CM | POA: Diagnosis not present

## 2022-03-19 DIAGNOSIS — M170 Bilateral primary osteoarthritis of knee: Secondary | ICD-10-CM | POA: Diagnosis not present

## 2022-03-19 DIAGNOSIS — I130 Hypertensive heart and chronic kidney disease with heart failure and stage 1 through stage 4 chronic kidney disease, or unspecified chronic kidney disease: Secondary | ICD-10-CM | POA: Diagnosis not present

## 2022-03-19 DIAGNOSIS — E559 Vitamin D deficiency, unspecified: Secondary | ICD-10-CM | POA: Diagnosis not present

## 2022-03-19 DIAGNOSIS — N951 Menopausal and female climacteric states: Secondary | ICD-10-CM | POA: Diagnosis not present

## 2022-03-21 DIAGNOSIS — E05 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: Secondary | ICD-10-CM | POA: Diagnosis not present

## 2022-03-21 DIAGNOSIS — H02539 Eyelid retraction unspecified eye, unspecified lid: Secondary | ICD-10-CM | POA: Diagnosis not present

## 2022-03-21 DIAGNOSIS — E079 Disorder of thyroid, unspecified: Secondary | ICD-10-CM | POA: Diagnosis not present

## 2022-03-21 DIAGNOSIS — H5789 Other specified disorders of eye and adnexa: Secondary | ICD-10-CM | POA: Diagnosis not present

## 2022-03-21 DIAGNOSIS — H0520 Unspecified exophthalmos: Secondary | ICD-10-CM | POA: Diagnosis not present

## 2022-03-21 DIAGNOSIS — H02536 Eyelid retraction left eye, unspecified eyelid: Secondary | ICD-10-CM | POA: Diagnosis not present

## 2022-03-21 DIAGNOSIS — H02533 Eyelid retraction right eye, unspecified eyelid: Secondary | ICD-10-CM | POA: Diagnosis not present

## 2022-03-22 DIAGNOSIS — H43811 Vitreous degeneration, right eye: Secondary | ICD-10-CM | POA: Diagnosis not present

## 2022-03-22 DIAGNOSIS — R69 Illness, unspecified: Secondary | ICD-10-CM | POA: Diagnosis not present

## 2022-03-22 DIAGNOSIS — M170 Bilateral primary osteoarthritis of knee: Secondary | ICD-10-CM | POA: Diagnosis not present

## 2022-03-22 DIAGNOSIS — H35033 Hypertensive retinopathy, bilateral: Secondary | ICD-10-CM | POA: Diagnosis not present

## 2022-03-22 DIAGNOSIS — I5032 Chronic diastolic (congestive) heart failure: Secondary | ICD-10-CM | POA: Diagnosis not present

## 2022-03-22 DIAGNOSIS — E559 Vitamin D deficiency, unspecified: Secondary | ICD-10-CM | POA: Diagnosis not present

## 2022-03-22 DIAGNOSIS — N189 Chronic kidney disease, unspecified: Secondary | ICD-10-CM | POA: Diagnosis not present

## 2022-03-22 DIAGNOSIS — E039 Hypothyroidism, unspecified: Secondary | ICD-10-CM | POA: Diagnosis not present

## 2022-03-22 DIAGNOSIS — Z8679 Personal history of other diseases of the circulatory system: Secondary | ICD-10-CM | POA: Diagnosis not present

## 2022-03-22 DIAGNOSIS — N951 Menopausal and female climacteric states: Secondary | ICD-10-CM | POA: Diagnosis not present

## 2022-03-22 DIAGNOSIS — E119 Type 2 diabetes mellitus without complications: Secondary | ICD-10-CM | POA: Diagnosis not present

## 2022-03-22 DIAGNOSIS — I130 Hypertensive heart and chronic kidney disease with heart failure and stage 1 through stage 4 chronic kidney disease, or unspecified chronic kidney disease: Secondary | ICD-10-CM | POA: Diagnosis not present

## 2022-03-27 DIAGNOSIS — I5032 Chronic diastolic (congestive) heart failure: Secondary | ICD-10-CM | POA: Diagnosis not present

## 2022-03-27 DIAGNOSIS — N951 Menopausal and female climacteric states: Secondary | ICD-10-CM | POA: Diagnosis not present

## 2022-03-27 DIAGNOSIS — M170 Bilateral primary osteoarthritis of knee: Secondary | ICD-10-CM | POA: Diagnosis not present

## 2022-03-27 DIAGNOSIS — I130 Hypertensive heart and chronic kidney disease with heart failure and stage 1 through stage 4 chronic kidney disease, or unspecified chronic kidney disease: Secondary | ICD-10-CM | POA: Diagnosis not present

## 2022-03-27 DIAGNOSIS — R69 Illness, unspecified: Secondary | ICD-10-CM | POA: Diagnosis not present

## 2022-03-27 DIAGNOSIS — E559 Vitamin D deficiency, unspecified: Secondary | ICD-10-CM | POA: Diagnosis not present

## 2022-03-27 DIAGNOSIS — E039 Hypothyroidism, unspecified: Secondary | ICD-10-CM | POA: Diagnosis not present

## 2022-03-27 DIAGNOSIS — N189 Chronic kidney disease, unspecified: Secondary | ICD-10-CM | POA: Diagnosis not present

## 2022-03-29 DIAGNOSIS — N951 Menopausal and female climacteric states: Secondary | ICD-10-CM | POA: Diagnosis not present

## 2022-03-29 DIAGNOSIS — M170 Bilateral primary osteoarthritis of knee: Secondary | ICD-10-CM | POA: Diagnosis not present

## 2022-03-29 DIAGNOSIS — N189 Chronic kidney disease, unspecified: Secondary | ICD-10-CM | POA: Diagnosis not present

## 2022-03-29 DIAGNOSIS — E559 Vitamin D deficiency, unspecified: Secondary | ICD-10-CM | POA: Diagnosis not present

## 2022-03-29 DIAGNOSIS — I5032 Chronic diastolic (congestive) heart failure: Secondary | ICD-10-CM | POA: Diagnosis not present

## 2022-03-29 DIAGNOSIS — R69 Illness, unspecified: Secondary | ICD-10-CM | POA: Diagnosis not present

## 2022-03-29 DIAGNOSIS — E039 Hypothyroidism, unspecified: Secondary | ICD-10-CM | POA: Diagnosis not present

## 2022-03-29 DIAGNOSIS — I130 Hypertensive heart and chronic kidney disease with heart failure and stage 1 through stage 4 chronic kidney disease, or unspecified chronic kidney disease: Secondary | ICD-10-CM | POA: Diagnosis not present

## 2022-03-30 DIAGNOSIS — E05 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: Secondary | ICD-10-CM | POA: Diagnosis not present

## 2022-04-24 DIAGNOSIS — R32 Unspecified urinary incontinence: Secondary | ICD-10-CM | POA: Diagnosis not present

## 2022-04-24 DIAGNOSIS — Z833 Family history of diabetes mellitus: Secondary | ICD-10-CM | POA: Diagnosis not present

## 2022-04-24 DIAGNOSIS — Z8249 Family history of ischemic heart disease and other diseases of the circulatory system: Secondary | ICD-10-CM | POA: Diagnosis not present

## 2022-04-24 DIAGNOSIS — E039 Hypothyroidism, unspecified: Secondary | ICD-10-CM | POA: Diagnosis not present

## 2022-04-24 DIAGNOSIS — L309 Dermatitis, unspecified: Secondary | ICD-10-CM | POA: Diagnosis not present

## 2022-04-24 DIAGNOSIS — E785 Hyperlipidemia, unspecified: Secondary | ICD-10-CM | POA: Diagnosis not present

## 2022-04-24 DIAGNOSIS — R600 Localized edema: Secondary | ICD-10-CM | POA: Diagnosis not present

## 2022-04-24 DIAGNOSIS — M1990 Unspecified osteoarthritis, unspecified site: Secondary | ICD-10-CM | POA: Diagnosis not present

## 2022-04-24 DIAGNOSIS — I10 Essential (primary) hypertension: Secondary | ICD-10-CM | POA: Diagnosis not present

## 2022-04-24 DIAGNOSIS — Z8673 Personal history of transient ischemic attack (TIA), and cerebral infarction without residual deficits: Secondary | ICD-10-CM | POA: Diagnosis not present

## 2022-04-24 DIAGNOSIS — Z6835 Body mass index (BMI) 35.0-35.9, adult: Secondary | ICD-10-CM | POA: Diagnosis not present

## 2022-05-01 DIAGNOSIS — E039 Hypothyroidism, unspecified: Secondary | ICD-10-CM | POA: Diagnosis not present

## 2022-05-21 DIAGNOSIS — E119 Type 2 diabetes mellitus without complications: Secondary | ICD-10-CM | POA: Diagnosis not present

## 2022-05-21 DIAGNOSIS — E039 Hypothyroidism, unspecified: Secondary | ICD-10-CM | POA: Diagnosis not present

## 2022-05-21 DIAGNOSIS — E05 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: Secondary | ICD-10-CM | POA: Diagnosis not present

## 2022-07-02 ENCOUNTER — Ambulatory Visit
Admission: RE | Admit: 2022-07-02 | Discharge: 2022-07-02 | Disposition: A | Discharge status: Home / Self Care | Payer: Medicare HMO | Source: Ambulatory Visit | Attending: Internal Medicine | Admitting: Internal Medicine

## 2022-07-02 DIAGNOSIS — M8589 Other specified disorders of bone density and structure, multiple sites: Secondary | ICD-10-CM | POA: Diagnosis not present

## 2022-07-02 DIAGNOSIS — Z780 Asymptomatic menopausal state: Secondary | ICD-10-CM | POA: Diagnosis not present

## 2022-07-02 DIAGNOSIS — E039 Hypothyroidism, unspecified: Secondary | ICD-10-CM | POA: Diagnosis not present

## 2022-07-02 DIAGNOSIS — Z13820 Encounter for screening for osteoporosis: Secondary | ICD-10-CM

## 2022-07-09 ENCOUNTER — Other Ambulatory Visit: Payer: Self-pay | Admitting: Internal Medicine

## 2022-07-09 DIAGNOSIS — E039 Hypothyroidism, unspecified: Secondary | ICD-10-CM | POA: Diagnosis not present

## 2022-07-09 DIAGNOSIS — E7800 Pure hypercholesterolemia, unspecified: Secondary | ICD-10-CM | POA: Diagnosis not present

## 2022-07-09 DIAGNOSIS — E673 Hypervitaminosis D: Secondary | ICD-10-CM | POA: Diagnosis not present

## 2022-07-09 DIAGNOSIS — E05 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: Secondary | ICD-10-CM | POA: Diagnosis not present

## 2022-07-09 DIAGNOSIS — I872 Venous insufficiency (chronic) (peripheral): Secondary | ICD-10-CM | POA: Diagnosis not present

## 2022-07-09 DIAGNOSIS — I10 Essential (primary) hypertension: Secondary | ICD-10-CM | POA: Diagnosis not present

## 2022-07-09 DIAGNOSIS — D649 Anemia, unspecified: Secondary | ICD-10-CM | POA: Diagnosis not present

## 2022-07-09 DIAGNOSIS — M8588 Other specified disorders of bone density and structure, other site: Secondary | ICD-10-CM | POA: Diagnosis not present

## 2022-07-09 DIAGNOSIS — Z1231 Encounter for screening mammogram for malignant neoplasm of breast: Secondary | ICD-10-CM

## 2022-07-13 ENCOUNTER — Ambulatory Visit: Payer: Medicare HMO

## 2022-08-07 DIAGNOSIS — E079 Disorder of thyroid, unspecified: Secondary | ICD-10-CM | POA: Diagnosis not present

## 2022-08-07 DIAGNOSIS — H5789 Other specified disorders of eye and adnexa: Secondary | ICD-10-CM | POA: Diagnosis not present

## 2022-08-07 DIAGNOSIS — H468 Other optic neuritis: Secondary | ICD-10-CM | POA: Diagnosis not present

## 2022-08-28 DIAGNOSIS — H5789 Other specified disorders of eye and adnexa: Secondary | ICD-10-CM | POA: Diagnosis not present

## 2022-08-28 DIAGNOSIS — E079 Disorder of thyroid, unspecified: Secondary | ICD-10-CM | POA: Diagnosis not present

## 2022-09-12 DIAGNOSIS — E039 Hypothyroidism, unspecified: Secondary | ICD-10-CM | POA: Diagnosis not present

## 2022-09-18 DIAGNOSIS — E039 Hypothyroidism, unspecified: Secondary | ICD-10-CM | POA: Diagnosis not present

## 2022-09-20 DIAGNOSIS — E05 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: Secondary | ICD-10-CM | POA: Diagnosis not present

## 2022-09-27 DIAGNOSIS — E05 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: Secondary | ICD-10-CM | POA: Diagnosis not present

## 2022-09-28 DIAGNOSIS — E05 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: Secondary | ICD-10-CM | POA: Diagnosis not present

## 2022-10-01 DIAGNOSIS — E05 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: Secondary | ICD-10-CM | POA: Diagnosis not present

## 2022-10-02 DIAGNOSIS — E05 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: Secondary | ICD-10-CM | POA: Diagnosis not present

## 2022-10-03 DIAGNOSIS — E05 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: Secondary | ICD-10-CM | POA: Diagnosis not present

## 2022-10-04 DIAGNOSIS — E05 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: Secondary | ICD-10-CM | POA: Diagnosis not present

## 2022-10-05 DIAGNOSIS — E05 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: Secondary | ICD-10-CM | POA: Diagnosis not present

## 2022-10-08 DIAGNOSIS — E05 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: Secondary | ICD-10-CM | POA: Diagnosis not present

## 2022-10-09 DIAGNOSIS — E05 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: Secondary | ICD-10-CM | POA: Diagnosis not present

## 2022-10-10 DIAGNOSIS — E05 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: Secondary | ICD-10-CM | POA: Diagnosis not present

## 2022-10-11 DIAGNOSIS — E05 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: Secondary | ICD-10-CM | POA: Diagnosis not present

## 2022-10-23 DIAGNOSIS — E039 Hypothyroidism, unspecified: Secondary | ICD-10-CM | POA: Diagnosis not present

## 2022-10-29 DIAGNOSIS — H43811 Vitreous degeneration, right eye: Secondary | ICD-10-CM | POA: Diagnosis not present

## 2022-10-29 DIAGNOSIS — E119 Type 2 diabetes mellitus without complications: Secondary | ICD-10-CM | POA: Diagnosis not present

## 2022-10-29 DIAGNOSIS — E039 Hypothyroidism, unspecified: Secondary | ICD-10-CM | POA: Diagnosis not present

## 2022-10-29 DIAGNOSIS — H35033 Hypertensive retinopathy, bilateral: Secondary | ICD-10-CM | POA: Diagnosis not present

## 2022-10-29 DIAGNOSIS — Z8669 Personal history of other diseases of the nervous system and sense organs: Secondary | ICD-10-CM | POA: Diagnosis not present

## 2022-11-14 DIAGNOSIS — E079 Disorder of thyroid, unspecified: Secondary | ICD-10-CM | POA: Diagnosis not present

## 2022-11-14 DIAGNOSIS — H5789 Other specified disorders of eye and adnexa: Secondary | ICD-10-CM | POA: Diagnosis not present

## 2022-11-21 DIAGNOSIS — J069 Acute upper respiratory infection, unspecified: Secondary | ICD-10-CM | POA: Diagnosis not present

## 2022-11-21 DIAGNOSIS — R051 Acute cough: Secondary | ICD-10-CM | POA: Diagnosis not present

## 2022-12-12 DIAGNOSIS — E039 Hypothyroidism, unspecified: Secondary | ICD-10-CM | POA: Diagnosis not present

## 2022-12-19 DIAGNOSIS — R69 Illness, unspecified: Secondary | ICD-10-CM | POA: Diagnosis not present

## 2022-12-19 DIAGNOSIS — R32 Unspecified urinary incontinence: Secondary | ICD-10-CM | POA: Diagnosis not present

## 2022-12-19 DIAGNOSIS — I10 Essential (primary) hypertension: Secondary | ICD-10-CM | POA: Diagnosis not present

## 2022-12-19 DIAGNOSIS — Z833 Family history of diabetes mellitus: Secondary | ICD-10-CM | POA: Diagnosis not present

## 2022-12-19 DIAGNOSIS — E039 Hypothyroidism, unspecified: Secondary | ICD-10-CM | POA: Diagnosis not present

## 2022-12-19 DIAGNOSIS — Z6841 Body Mass Index (BMI) 40.0 and over, adult: Secondary | ICD-10-CM | POA: Diagnosis not present

## 2022-12-19 DIAGNOSIS — Z8249 Family history of ischemic heart disease and other diseases of the circulatory system: Secondary | ICD-10-CM | POA: Diagnosis not present

## 2022-12-19 DIAGNOSIS — Z008 Encounter for other general examination: Secondary | ICD-10-CM | POA: Diagnosis not present

## 2022-12-19 DIAGNOSIS — Z8673 Personal history of transient ischemic attack (TIA), and cerebral infarction without residual deficits: Secondary | ICD-10-CM | POA: Diagnosis not present

## 2022-12-19 DIAGNOSIS — E785 Hyperlipidemia, unspecified: Secondary | ICD-10-CM | POA: Diagnosis not present

## 2022-12-19 DIAGNOSIS — R609 Edema, unspecified: Secondary | ICD-10-CM | POA: Diagnosis not present

## 2022-12-19 DIAGNOSIS — L309 Dermatitis, unspecified: Secondary | ICD-10-CM | POA: Diagnosis not present

## 2023-01-02 DIAGNOSIS — I132 Hypertensive heart and chronic kidney disease with heart failure and with stage 5 chronic kidney disease, or end stage renal disease: Secondary | ICD-10-CM | POA: Diagnosis not present

## 2023-01-02 DIAGNOSIS — E039 Hypothyroidism, unspecified: Secondary | ICD-10-CM | POA: Diagnosis not present

## 2023-01-02 DIAGNOSIS — I5032 Chronic diastolic (congestive) heart failure: Secondary | ICD-10-CM | POA: Diagnosis not present

## 2023-01-02 DIAGNOSIS — Z23 Encounter for immunization: Secondary | ICD-10-CM | POA: Diagnosis not present

## 2023-01-02 DIAGNOSIS — E05 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: Secondary | ICD-10-CM | POA: Diagnosis not present

## 2023-01-02 DIAGNOSIS — H538 Other visual disturbances: Secondary | ICD-10-CM | POA: Diagnosis not present

## 2023-01-02 DIAGNOSIS — G309 Alzheimer's disease, unspecified: Secondary | ICD-10-CM | POA: Diagnosis not present

## 2023-01-02 DIAGNOSIS — E1122 Type 2 diabetes mellitus with diabetic chronic kidney disease: Secondary | ICD-10-CM | POA: Diagnosis not present

## 2023-01-02 DIAGNOSIS — F0280 Dementia in other diseases classified elsewhere without behavioral disturbance: Secondary | ICD-10-CM | POA: Diagnosis not present

## 2023-01-02 DIAGNOSIS — R7303 Prediabetes: Secondary | ICD-10-CM | POA: Diagnosis not present

## 2023-01-02 DIAGNOSIS — N1831 Chronic kidney disease, stage 3a: Secondary | ICD-10-CM | POA: Diagnosis not present

## 2023-01-02 DIAGNOSIS — R69 Illness, unspecified: Secondary | ICD-10-CM | POA: Diagnosis not present

## 2023-01-02 DIAGNOSIS — D631 Anemia in chronic kidney disease: Secondary | ICD-10-CM | POA: Diagnosis not present

## 2023-01-02 DIAGNOSIS — E1151 Type 2 diabetes mellitus with diabetic peripheral angiopathy without gangrene: Secondary | ICD-10-CM | POA: Diagnosis not present

## 2023-01-15 DIAGNOSIS — K219 Gastro-esophageal reflux disease without esophagitis: Secondary | ICD-10-CM | POA: Diagnosis not present

## 2023-01-15 DIAGNOSIS — N1831 Chronic kidney disease, stage 3a: Secondary | ICD-10-CM | POA: Diagnosis not present

## 2023-01-15 DIAGNOSIS — D649 Anemia, unspecified: Secondary | ICD-10-CM | POA: Diagnosis not present

## 2023-01-15 DIAGNOSIS — I130 Hypertensive heart and chronic kidney disease with heart failure and stage 1 through stage 4 chronic kidney disease, or unspecified chronic kidney disease: Secondary | ICD-10-CM | POA: Diagnosis not present

## 2023-01-15 DIAGNOSIS — Z0000 Encounter for general adult medical examination without abnormal findings: Secondary | ICD-10-CM | POA: Diagnosis not present

## 2023-01-15 DIAGNOSIS — I700 Atherosclerosis of aorta: Secondary | ICD-10-CM | POA: Diagnosis not present

## 2023-01-15 DIAGNOSIS — I5032 Chronic diastolic (congestive) heart failure: Secondary | ICD-10-CM | POA: Diagnosis not present

## 2023-01-15 DIAGNOSIS — R7303 Prediabetes: Secondary | ICD-10-CM | POA: Diagnosis not present

## 2023-01-15 DIAGNOSIS — E7800 Pure hypercholesterolemia, unspecified: Secondary | ICD-10-CM | POA: Diagnosis not present

## 2023-01-15 DIAGNOSIS — E039 Hypothyroidism, unspecified: Secondary | ICD-10-CM | POA: Diagnosis not present

## 2023-01-15 DIAGNOSIS — E05 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: Secondary | ICD-10-CM | POA: Diagnosis not present

## 2023-01-15 DIAGNOSIS — Z6841 Body Mass Index (BMI) 40.0 and over, adult: Secondary | ICD-10-CM | POA: Diagnosis not present

## 2023-01-15 DIAGNOSIS — I10 Essential (primary) hypertension: Secondary | ICD-10-CM | POA: Diagnosis not present

## 2023-01-17 ENCOUNTER — Ambulatory Visit: Payer: Medicare HMO

## 2023-01-30 ENCOUNTER — Ambulatory Visit: Payer: Medicare HMO | Admitting: Cardiovascular Disease

## 2023-02-05 ENCOUNTER — Encounter: Payer: Self-pay | Admitting: Cardiovascular Disease

## 2023-02-05 ENCOUNTER — Ambulatory Visit: Payer: Medicare HMO | Attending: Cardiovascular Disease | Admitting: Cardiovascular Disease

## 2023-02-05 VITALS — BP 134/84 | HR 88 | Ht 62.0 in | Wt 235.2 lb

## 2023-02-05 DIAGNOSIS — I5032 Chronic diastolic (congestive) heart failure: Secondary | ICD-10-CM | POA: Diagnosis not present

## 2023-02-05 NOTE — Progress Notes (Signed)
[name] Date of Birth  1951-12-05 [facility]        [address]    [place]     [place], [place]  [address]      [phone]  Fax  [phone]       Problem list: 1. Hypertension 2. Diastolic congestive heart failure 3. Hypothyroid 4. Obesity  5. History of seizures  Devone is a 72 year old female who presented to our office last month with shortness of breath. We started her on Lasix and potassium.  Feels well preserved left ventricular systolic function. She does have mild left ventricular hypertrophy. Her ejection fraction is around 60%.  She is feeling quite a bit better on the Lasix.  A BMP drawn at the time of her echocardiogram reveals that her potassium is 3.8. Her other electrolytes are okay to  She has had some GI bleeding She was found to have some hemorrhoids injected.  July 17, 2012: Pt is doing well.  She has had some lightheadedness and was reduce her Lasix. She now takes a half a tablet Lasix every third or fourth day a week. Otherwise she seems to be feeling fairly well.  May 25, 2013:  She is still very short of breath with exertion.  She has continued to gain weight.  She eats lots of breath.    06/21/2014:  She continues to have problems with weight.   She has borderline diabetes.  No CP, no dyspnea  June 27, 2015:  Was recently diagnosed with possible  Graves disease  ( according to eye doctor )  Breathing has been ok. Does not walk regularly   Nov. 3, 2017:  No CP , has some DOE  Has some wheezing  Has gained some weight .   Has mild leg swelling   March 19, 2018: Nykita is seen today for follow-up of her hypertension, chronic diastolic congestive heart failure and morbid obesity.  She was last seen in November, 2017.  Has DOE walking into the office. Has lost some weight.   10 lbs weight loss since Nov. 2018.    Has leg pain at night - no claudication pain with walking ( she asked about our PAD poster)  Primary MD  is managing her lipids   Aug. 19, 2020   Seen for follow of of chronic diastolic CHF.   BP is well controlled.  Is on a new med for her Graves disease.  Wt is 290 today  ( up 12 lbs from last )   Nov. 22, 2021 Juaquina is seen for follow up of her chronic diastolic CHF Wt. Today is 269. - down 21 lbs from last year  BP is well controlled.   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 12, 2023 Marsia is seen for follow up visit of her diastoic CHF  Wt is 197 lbs ( down 74 lbs since last year)  She lost 60+ lbs during an acute illness ( diverticulitis)   No CP , no dyspnea  BP at home is well controlled.    Feb. 20, 2024  Meriel is seen for follow up visit for her diastolic CHF  Wt is [id number] lbs ( up 38 lbs from last year )   Breathing is ok  Able to exercise  Is not walking   Current Outpatient Medications on File Prior to Visit  Medication Sig Dispense Refill  amLODipine (NORVASC) 5 MG tablet Take 5 mg by mouth daily.     Cyanocobalamin (B-12) 3000 MCG CAPS 1 tablet Sublingual once a day     ECHINACEA PO Take by mouth.     Ferrous Sulfate (IRON) 325 (65 Fe) MG TABS 1 tablet     furosemide (LASIX) 20 MG tablet Take 1 tablet (20 mg total) by mouth daily. (Patient taking differently: Take 20 mg by mouth daily as needed.) 7 tablet 0   levothyroxine (SYNTHROID) 112 MCG tablet Take 1 tablet by mouth Monday through Saturday and 1/2 tablet on Sunday. 30 tablet 3   rosuvastatin (CRESTOR) 5 MG tablet TAKE 1 TABLET (5 MG TOTAL) BY MOUTH DAILY. 90 tablet 3   RYBELSUS 3 MG TABS Take 1 tablet by mouth every morning.     albuterol (VENTOLIN HFA) 108 (90 Base) MCG/ACT inhaler Inhale 2 puffs into the lungs every 6 (six) hours as needed for wheezing or shortness of breath. (Patient not taking: Reported on 12/28/2021)     feeding supplement (ENSURE ENLIVE / ENSURE PLUS) LIQD Take 237 mLs by mouth 2 (two) times daily between meals. (Patient not  taking: Reported on 07/01/2021) 237 mL 12   Fluticasone-Salmeterol (ADVAIR) 250-50 MCG/DOSE AEPB Inhale 1 puff into the lungs daily as needed for allergies. (Patient not taking: Reported on 12/28/2021)     glucose blood (ONETOUCH VERIO) test strip Use as intstructed to check blood sugar once a day Dx Code E11.9 (Patient not taking: Reported on 07/01/2021) 100 strip 12   Lancets (ONETOUCH ULTRASOFT) lancets Use as instructed (Patient not taking: Reported on 07/01/2021) 100 each 12   levETIRAcetam (KEPPRA) 500 MG tablet Take 1 tablet (500 mg total) by mouth 2 (two) times daily. (Patient not taking: Reported on 12/28/2021)     meclizine (ANTIVERT) 25 MG tablet Take 1 tablet by mouth daily as needed for dizziness. (Patient not taking: Reported on 12/28/2021)     Multiple Vitamin (MULTIVITAMIN WITH MINERALS) TABS tablet Take 1 tablet by mouth daily. (Patient not taking: Reported on 12/28/2021)     ondansetron (ZOFRAN) 4 MG tablet Take 4 mg by mouth every 8 (eight) hours as needed for nausea or vomiting. (Patient not taking: Reported on 12/28/2021)     OVER THE COUNTER MEDICATION Take 1 tablet by mouth daily. Medication: Vitamin b12 3000 mcg (Patient not taking: Reported on 12/28/2021)     pantoprazole (PROTONIX) 40 MG tablet Take 40 mg by mouth 2 (two) times daily. (Patient not taking: Reported on 12/28/2021)     polyvinyl alcohol (LIQUIFILM TEARS) 1.4 % ophthalmic solution Place 1 drop into both eyes as needed for dry eyes. (Patient not taking: Reported on 12/28/2021)     saccharomyces boulardii (FLORASTOR) 250 MG capsule Take 1 capsule (250 mg total) by mouth 2 (two) times daily. (Patient not taking: Reported on 12/28/2021)     No current facility-administered medications on file prior to visit.    Allergies  Allergen Reactions   Atorvastatin     Other reaction(s): ?muscle aches   Gluten Meal     Celiac    Past Medical History:  Diagnosis Date   Anemia    Celiac disease    CHF (congestive heart failure)  (HCC)    Depression    Diabetes mellitus without complication ([place])    Edema    lower extremities   Emphysema of lung ([place])    Enlarged heart    begining stage   Graves disease    Headache(784.0) 05/18/2013  Heart murmur    Hematochezia    Hemorrhoid    Hyperlipidemia    Hypertension    Migraine    Obese    OSA (obstructive sleep apnea)    Seizures ([place])    Sleep apnea    Stroke [facility])    left P-O ICH 2007   Thyroid disease    hypothyroidism   Ulcer    Vitamin D deficiency     Past Surgical History:  Procedure Laterality Date   CESAREAN SECTION     ESOPHAGOGASTRODUODENOSCOPY N/A 06/14/2021   Procedure: ESOPHAGOGASTRODUODENOSCOPY (EGD);  Surgeon: Carol Ada, MD;  Location: [facility];  Service: Endoscopy;  Laterality: N/A;   ESOPHAGOGASTRODUODENOSCOPY (EGD) WITH PROPOFOL N/A 06/01/2021   Procedure: ESOPHAGOGASTRODUODENOSCOPY (EGD) WITH PROPOFOL;  Surgeon: Carol Ada, MD;  Location: [facility];  Service: Endoscopy;  Laterality: N/A;   HEMORRHOID SURGERY     IR IVC FILTER PLMT / S&I /IMG GUID/MOD SED  06/01/2021   IR SINUS/FIST TUBE CHK-NON GI  06/14/2021   IR SINUS/FIST TUBE CHK-NON GI  07/03/2021   IR US GUIDE VASC ACCESS RIGHT  06/01/2021   IUD REMOVAL     TONSILLECTOMY     VEIN SURGERY      Social History   Tobacco Use  Smoking Status Never  Smokeless Tobacco Never    Social History   Substance and Sexual Activity  Alcohol Use No    Family History  Problem Relation Age of Onset   Heart disease Father    Diabetes Father    Heart failure Father    Diabetes Mother    Thyroid disease Sister    Diabetes Brother     Reviw of Systems:  Reviewed in the HPI.  All other systems are negative.   Physical Exam: Blood pressure 134/84, pulse 88, height 5' 2"$  (1.575 m), weight 235 lb 3.2 oz (106.7 kg), SpO2 98 %.       GEN:  moderately obese , elderly female  in no acute distress HEENT:  proptosis NECK: No JVD; No carotid bruits LYMPHATICS: No  lymphadenopathy CARDIAC: RRR , no murmurs, rubs, gallops RESPIRATORY:  Clear to auscultation without rales, wheezing or rhonchi  ABDOMEN: Soft, non-tender, non-distended MUSCULOSKELETAL:  No edema; No deformity  SKIN: Warm and dry NEUROLOGIC:  Alert and oriented x 3    ECG:   Feb. 20, 2024:   NSR at 88.   LVH with QRS widening    Assessment / Plan:   1. Hypertension- BP is well controlled.   Cont current meds I've recommneded weight loss   2. Diastolic congestive heart failure-    stable   3. Hypothyroid-    4. Obesity -  advised weight loss    . Mertie Moores, MD  02/05/2023 5:29 PM    [facility] [place],  [place], [place]  [address] Pager [phone] Phone: [phone]; Fax: [phone]

## 2023-02-05 NOTE — Patient Instructions (Signed)
Medication Instructions:  The current medical regimen is effective;  continue present plan and medications.  *If you need a refill on your cardiac medications before your next appointment, please call your pharmacy*  Follow-Up: At [facility], you and your health needs are our priority.  As part of our continuing mission to provide you with exceptional heart care, we have created designated Provider Care Teams.  These Care Teams include your primary Cardiologist (physician) and Advanced Practice Providers (APPs -  Physician Assistants and Nurse Practitioners) who all work together to provide you with the care you need, when you need it.  We recommend signing up for the patient portal called "MyChart".  Sign up information is provided on this After Visit Summary.  MyChart is used to connect with patients for Virtual Visits (Telemedicine).  Patients are able to view lab/test results, encounter notes, upcoming appointments, etc.  Non-urgent messages can be sent to your provider as well.   To learn more about what you can do with MyChart, go to [email or web address].    Your next appointment:   1 year(s)  Provider:   Mertie Moores, MD

## 2023-03-06 DIAGNOSIS — E039 Hypothyroidism, unspecified: Secondary | ICD-10-CM | POA: Diagnosis not present

## 2023-03-06 DIAGNOSIS — H524 Presbyopia: Secondary | ICD-10-CM | POA: Diagnosis not present

## 2023-03-06 DIAGNOSIS — E119 Type 2 diabetes mellitus without complications: Secondary | ICD-10-CM | POA: Diagnosis not present

## 2023-03-06 DIAGNOSIS — H5203 Hypermetropia, bilateral: Secondary | ICD-10-CM | POA: Diagnosis not present

## 2023-04-29 DIAGNOSIS — H35033 Hypertensive retinopathy, bilateral: Secondary | ICD-10-CM | POA: Diagnosis not present

## 2023-04-29 DIAGNOSIS — H348192 Central retinal vein occlusion, unspecified eye, stable: Secondary | ICD-10-CM | POA: Diagnosis not present

## 2023-04-29 DIAGNOSIS — H43811 Vitreous degeneration, right eye: Secondary | ICD-10-CM | POA: Diagnosis not present

## 2023-04-29 DIAGNOSIS — E119 Type 2 diabetes mellitus without complications: Secondary | ICD-10-CM | POA: Diagnosis not present

## 2023-05-03 ENCOUNTER — Emergency Department (HOSPITAL_COMMUNITY)
Admission: EM | Admit: 2023-05-03 | Discharge: 2023-05-03 | Disposition: A | Discharge status: Home / Self Care | Payer: Medicare HMO | Attending: Emergency Medicine | Admitting: Emergency Medicine

## 2023-05-03 ENCOUNTER — Encounter (HOSPITAL_COMMUNITY): Payer: Self-pay

## 2023-05-03 ENCOUNTER — Emergency Department (HOSPITAL_COMMUNITY): Payer: Medicare HMO

## 2023-05-03 ENCOUNTER — Other Ambulatory Visit: Payer: Self-pay

## 2023-05-03 DIAGNOSIS — R1084 Generalized abdominal pain: Secondary | ICD-10-CM | POA: Insufficient documentation

## 2023-05-03 DIAGNOSIS — R112 Nausea with vomiting, unspecified: Secondary | ICD-10-CM | POA: Insufficient documentation

## 2023-05-03 DIAGNOSIS — J449 Chronic obstructive pulmonary disease, unspecified: Secondary | ICD-10-CM | POA: Insufficient documentation

## 2023-05-03 DIAGNOSIS — I509 Heart failure, unspecified: Secondary | ICD-10-CM | POA: Diagnosis not present

## 2023-05-03 DIAGNOSIS — R1110 Vomiting, unspecified: Secondary | ICD-10-CM | POA: Diagnosis not present

## 2023-05-03 DIAGNOSIS — E119 Type 2 diabetes mellitus without complications: Secondary | ICD-10-CM | POA: Insufficient documentation

## 2023-05-03 DIAGNOSIS — R109 Unspecified abdominal pain: Secondary | ICD-10-CM | POA: Diagnosis not present

## 2023-05-03 DIAGNOSIS — Z8673 Personal history of transient ischemic attack (TIA), and cerebral infarction without residual deficits: Secondary | ICD-10-CM | POA: Insufficient documentation

## 2023-05-03 LAB — COMPREHENSIVE METABOLIC PANEL
ALT: 14 U/L (ref 0–44)
AST: 22 U/L (ref 15–41)
Albumin: 4 g/dL (ref 3.5–5.0)
Alkaline Phosphatase: 93 U/L (ref 38–126)
Anion gap: 12 (ref 5–15)
BUN: 19 mg/dL (ref 8–23)
CO2: 25 mmol/L (ref 22–32)
Calcium: 9.8 mg/dL (ref 8.9–10.3)
Chloride: 101 mmol/L (ref 98–111)
Creatinine, Ser: 1.1 mg/dL — ABNORMAL HIGH (ref 0.44–1.00)
GFR, Estimated: 53 mL/min — ABNORMAL LOW (ref >60)
Glucose, Bld: 124 mg/dL — ABNORMAL HIGH (ref 70–99)
Potassium: 4 mmol/L (ref 3.5–5.1)
Sodium: 138 mmol/L (ref 135–145)
Total Bilirubin: 0.8 mg/dL (ref 0.3–1.2)
Total Protein: 7.9 g/dL (ref 6.5–8.1)

## 2023-05-03 LAB — URINALYSIS, ROUTINE W REFLEX MICROSCOPIC
Bacteria, UA: NONE SEEN
Bilirubin Urine: NEGATIVE
Glucose, UA: NEGATIVE mg/dL
Hgb urine dipstick: NEGATIVE
Ketones, ur: NEGATIVE mg/dL
Leukocytes,Ua: NEGATIVE
Nitrite: NEGATIVE
Protein, ur: 100 mg/dL — AB
Specific Gravity, Urine: 1.044 — ABNORMAL HIGH (ref 1.005–1.030)
pH: 7 (ref 5.0–8.0)

## 2023-05-03 LAB — CBC
HCT: 41.3 % (ref 36.0–46.0)
Hemoglobin: 13.3 g/dL (ref 12.0–15.0)
MCH: 29.9 pg (ref 26.0–34.0)
MCHC: 32.2 g/dL (ref 30.0–36.0)
MCV: 92.8 fL (ref 80.0–100.0)
Platelets: 262 10*3/uL (ref 150–400)
RBC: 4.45 MIL/uL (ref 3.87–5.11)
RDW: 15.1 % (ref 11.5–15.5)
WBC: 6.5 10*3/uL (ref 4.0–10.5)
nRBC: 0 % (ref 0.0–0.2)

## 2023-05-03 LAB — LACTIC ACID, PLASMA: Lactic Acid, Venous: 1.2 mmol/L (ref 0.5–1.9)

## 2023-05-03 LAB — CBG MONITORING, ED: Glucose-Capillary: 130 mg/dL — ABNORMAL HIGH (ref 70–99)

## 2023-05-03 LAB — LIPASE, BLOOD: Lipase: 25 U/L (ref 11–51)

## 2023-05-03 MED ORDER — ONDANSETRON HCL 4 MG/2ML IJ SOLN
4.0000 mg | Freq: Once | INTRAMUSCULAR | Status: AC
  Administered 2023-05-03: 4 mg via INTRAVENOUS
  Filled 2023-05-03: qty 2

## 2023-05-03 MED ORDER — ONDANSETRON 8 MG PO TBDP: 8.0000 mg | ORAL_TABLET | Freq: Three times a day (TID) | ORAL | 0 refills | Status: AC | PRN

## 2023-05-03 MED ORDER — SODIUM CHLORIDE 0.9 % IV BOLUS
1000.0000 mL | Freq: Once | INTRAVENOUS | Status: AC
  Administered 2023-05-03: 1000 mL via INTRAVENOUS

## 2023-05-03 MED ORDER — IOHEXOL 350 MG/ML SOLN
75.0000 mL | Freq: Once | INTRAVENOUS | Status: AC | PRN
  Administered 2023-05-03: 75 mL via INTRAVENOUS

## 2023-05-03 MED ORDER — MORPHINE SULFATE (PF) 4 MG/ML IV SOLN
4.0000 mg | Freq: Once | INTRAVENOUS | Status: AC
  Administered 2023-05-03: 4 mg via INTRAVENOUS
  Filled 2023-05-03: qty 1

## 2023-05-03 MED ORDER — ONDANSETRON 4 MG PO TBDP
4.0000 mg | ORAL_TABLET | Freq: Once | ORAL | Status: AC | PRN
  Administered 2023-05-03: 4 mg via ORAL
  Filled 2023-05-03: qty 1

## 2023-05-03 NOTE — ED Notes (Signed)
Discharge instructions discussed with pt. Verbalized understanding. VSS. No questions or concerns regarding discharge  

## 2023-05-03 NOTE — ED Notes (Signed)
Patient transported to CT 

## 2023-05-03 NOTE — Discharge Instructions (Addendum)
Laboratory test and CT scan were reassuring.  Return to the ER if you start having trouble with recurrent vomiting abdominal pain fevers or other concerns.  Follow-up with your doctor in the next week to be rechecked.  Take the Zofran to help with any nausea at home.

## 2023-05-03 NOTE — ED Triage Notes (Signed)
Pt reports abdominal pain and emesis since last night at 2115. Emesis x 9. Reports hx of type 2 DM but is not on any medication for same d/t good control and has therefore not been checking her blood sugar at home.

## 2023-05-03 NOTE — ED Provider Notes (Signed)
[place] EMERGENCY DEPARTMENT AT [facility] Provider Note   CSN: [id number] Arrival date & time: 05/03/23  1305     History  Chief Complaint  Patient presents with   Emesis   Abdominal Pain    Alaza Golson is a 72 y.o. female.  She has a history of stroke thyroid disease COPD diabetes CHF.  She has been having some generalized abdominal pain for about a week.  Acutely worsened last night associated with multiple episodes of vomiting throughout the night.  She said the vomitus was yellow and then green.  Stools have been soft but nonbloody and not frank diarrhea.  No known fevers or chills.  No cough or shortness of breath.  No sick contacts or recent travel.  She has tried nothing for her symptoms.  The history is provided by the patient.  Emesis Severity:  Severe Duration:  18 hours Timing:  Intermittent Quality:  Bilious material Progression:  Unchanged Chronicity:  New Recent urination:  Normal Relieved by:  Nothing Worsened by:  Nothing Ineffective treatments:  None tried Associated symptoms: abdominal pain   Associated symptoms: no cough, no diarrhea and no fever   Abdominal Pain Pain location:  Generalized Pain quality: aching and cramping   Pain severity:  Moderate Onset quality:  Gradual Duration:  1 week Timing:  Intermittent Progression:  Unchanged Chronicity:  New Context: not suspicious food intake and not trauma   Relieved by:  None tried Worsened by:  Nothing Ineffective treatments:  None tried Associated symptoms: vomiting   Associated symptoms: no cough, no diarrhea and no fever        Home Medications Prior to Admission medications   Medication Sig Start Date End Date Taking? Authorizing Provider  albuterol (VENTOLIN HFA) 108 (90 Base) MCG/ACT inhaler Inhale 2 puffs into the lungs every 6 (six) hours as needed for wheezing or shortness of breath. Patient not taking: Reported on 12/28/2021    [provider]   amLODipine (NORVASC) 5 MG tablet Take 5 mg by mouth daily.    [provider]  Cyanocobalamin (B-12) 3000 MCG CAPS 1 tablet Sublingual once a day    [provider]  ECHINACEA PO Take by mouth.    [provider]  feeding supplement (ENSURE ENLIVE / ENSURE PLUS) LIQD Take 237 mLs by mouth 2 (two) times daily between meals. Patient not taking: Reported on 07/01/2021 06/23/21   Narda Bonds, MD  Ferrous Sulfate (IRON) 325 (65 Fe) MG TABS 1 tablet    [provider]  Fluticasone-Salmeterol (ADVAIR) 250-50 MCG/DOSE AEPB Inhale 1 puff into the lungs daily as needed for allergies. Patient not taking: Reported on 12/28/2021    [provider]  furosemide (LASIX) 20 MG tablet Take 1 tablet (20 mg total) by mouth daily. Patient taking differently: Take 20 mg by mouth daily as needed. 05/22/21   [name], NP  glucose blood (ONETOUCH VERIO) test strip Use as intstructed to check blood sugar once a day Dx Code E11.9 Patient not taking: Reported on 07/01/2021 02/18/21   Reather Littler, MD  Lancets [facility] ULTRASOFT) lancets Use as instructed Patient not taking: Reported on 07/01/2021 10/13/20   Reather Littler, MD  levETIRAcetam (KEPPRA) 500 MG tablet Take 1 tablet (500 mg total) by mouth 2 (two) times daily. Patient not taking: Reported on 12/28/2021 07/10/21   Standley Brooking, MD  levothyroxine (SYNTHROID) 112 MCG tablet Take 1 tablet by mouth Monday through Saturday and 1/2 tablet on  Sunday. 11/08/20   Reather Littler, MD  meclizine (ANTIVERT) 25 MG tablet Take 1 tablet by mouth daily as needed for dizziness. Patient not taking: Reported on 12/28/2021    [provider]  Multiple Vitamin (MULTIVITAMIN WITH MINERALS) TABS tablet Take 1 tablet by mouth daily. Patient not taking: Reported on 12/28/2021 07/11/21   Standley Brooking, MD  ondansetron (ZOFRAN) 4 MG tablet Take 4 mg by mouth every 8 (eight) hours as needed for nausea or vomiting. Patient not  taking: Reported on 12/28/2021    [provider]  OVER THE COUNTER MEDICATION Take 1 tablet by mouth daily. Medication: Vitamin b12 3000 mcg Patient not taking: Reported on 12/28/2021    [provider]  pantoprazole (PROTONIX) 40 MG tablet Take 40 mg by mouth 2 (two) times daily. Patient not taking: Reported on 12/28/2021    [provider]  polyvinyl alcohol (LIQUIFILM TEARS) 1.4 % ophthalmic solution Place 1 drop into both eyes as needed for dry eyes. Patient not taking: Reported on 12/28/2021 07/10/21   Standley Brooking, MD  rosuvastatin (CRESTOR) 5 MG tablet TAKE 1 TABLET (5 MG TOTAL) BY MOUTH DAILY. 01/05/22   Nahser, Deloris Ping, MD  RYBELSUS 3 MG TABS Take 1 tablet by mouth every morning.    [provider]  saccharomyces boulardii (FLORASTOR) 250 MG capsule Take 1 capsule (250 mg total) by mouth 2 (two) times daily. Patient not taking: Reported on 12/28/2021 06/23/21   Narda Bonds, MD      Allergies    Atorvastatin and Gluten meal    Review of Systems   Review of Systems  Constitutional:  Negative for fever.  Respiratory:  Negative for cough.   Gastrointestinal:  Positive for abdominal pain and vomiting. Negative for diarrhea.    Physical Exam Updated Vital Signs BP 136/86 (BP Location: Right Arm)   Pulse 88   Temp 98.6 F (37 C)   Resp 20   SpO2 98%  Physical Exam Vitals and nursing note reviewed.  Constitutional:      General: She is not in acute distress.    Appearance: She is well-developed. She is obese.  HENT:     Head: Normocephalic and atraumatic.  Eyes:     Conjunctiva/sclera: Conjunctivae normal.     Comments: Bilateral proptotic eyes  Cardiovascular:     Rate and Rhythm: Normal rate and regular rhythm.     Heart sounds: No murmur heard. Pulmonary:     Effort: Pulmonary effort is normal. No respiratory distress.     Breath sounds: Normal breath sounds.  Abdominal:     Palpations: Abdomen is soft.     Tenderness: There  is generalized abdominal tenderness. There is no guarding or rebound.  Musculoskeletal:        General: No swelling.     Cervical back: Neck supple.  Skin:    General: Skin is warm and dry.     Capillary Refill: Capillary refill takes less than 2 seconds.  Neurological:     General: No focal deficit present.     Mental Status: She is alert.     ED Results / Procedures / Treatments   Labs (all labs ordered are listed, but only abnormal results are displayed) Labs Reviewed  COMPREHENSIVE METABOLIC PANEL - Abnormal; Notable for the following components:      Result Value   Glucose, Bld 124 (*)    Creatinine, Ser 1.10 (*)    GFR, Estimated 53 (*)  All other components within normal limits  CBG MONITORING, ED - Abnormal; Notable for the following components:   Glucose-Capillary 130 (*)    All other components within normal limits  LIPASE, BLOOD  CBC  LACTIC ACID, PLASMA  URINALYSIS, ROUTINE W REFLEX MICROSCOPIC  LACTIC ACID, PLASMA    EKG None  Radiology CT ABDOMEN PELVIS W CONTRAST  Result Date: 05/03/2023 CLINICAL DATA:  Abdominal pain, vomiting EXAM: CT ABDOMEN AND PELVIS WITH CONTRAST TECHNIQUE: Multidetector CT imaging of the abdomen and pelvis was performed using the standard protocol following bolus administration of intravenous contrast. RADIATION DOSE REDUCTION: This exam was performed according to the departmental dose-optimization program which includes automated exposure control, adjustment of the mA and/or kV according to patient size and/or use of iterative reconstruction technique. CONTRAST:  75mL OMNIPAQUE IOHEXOL 350 MG/ML SOLN COMPARISON:  07/01/2021 FINDINGS: Lower chest: Visualized lower lung fields are clear. There are scattered coronary artery calcifications. Dense calcifications are noted in the mitral annulus. Hepatobiliary: There is a 1.5 cm fluid density lesion in the right lobe, possibly a cyst. There is no dilation of bile ducts. Gallbladder is  unremarkable. Pancreas: There is slight prominence of pancreatic duct. No focal abnormalities are seen. Spleen: Unremarkable. Adrenals/Urinary Tract: Adrenals are unremarkable. There is no hydronephrosis. There are no renal or ureteral stones. Urinary bladder is unremarkable. Stomach/Bowel: Small hiatal hernia is seen. Stomach is unremarkable. There is dilation of few proximal small bowel loops, more so in the suprapubic region. Dilated small bowel loops measure up to 3.5 cm in diameter. There is minimal stranding in the mesentery adjacent to the dilated small bowel loops in lower mid abdomen. Appendix is not dilated. Distal and terminal ileum are not dilated. Scattered diverticula are seen in colon without signs of focal diverticulitis. Vascular/Lymphatic: Scattered arterial calcifications are seen. Inferior vena caval filter is seen. Reproductive: Unremarkable. Other: There is no ascites or pneumoperitoneum. Musculoskeletal: Minimal anterolisthesis is seen at L3-L4 and L4-L5 levels. Degenerative changes are noted in lower thoracic spine and lumbar spine. IMPRESSION: There is mild-to-moderate dilation of small bowel loops, more so in the suprapubic region. There is stranding in the mesenteric fat adjacent to the dilated small bowel loops in lower mid abdomen. Findings suggest inflammatory or infectious enteritis. Distal small bowel loops are not dilated. There is no significant bowel obstruction or pneumoperitoneum. There is no hydronephrosis. Appendix is not dilated. Diverticulosis of colon without signs of focal diverticulitis. Coronary artery disease. Possible hepatic cyst. Arteriosclerosis. Lumbar spondylosis. Electronically Signed   By: Ernie Avena M.D.   On: 05/03/2023 15:55    Procedures Procedures    Medications Ordered in ED Medications  sodium chloride 0.9 % bolus 1,000 mL (has no administration in time range)  ondansetron (ZOFRAN) injection 4 mg (has no administration in time range)   morphine (PF) 4 MG/ML injection 4 mg (has no administration in time range)  ondansetron (ZOFRAN-ODT) disintegrating tablet 4 mg (4 mg Oral Given 05/03/23 1318)    ED Course/ Medical Decision Making/ A&P Clinical Course as of 05/03/23 1744  Fri May 03, 2023  1611 CT showing some small bowel thickening enteritis versus ileus but no definite obstruction.  Will allow patient to get her IV fluids and then p.o. trial to see if she is appropriate for outpatient or needs admission. [MB]    Clinical Course User Index [MB] Terrilee Files, MD  Medical Decision Making Amount and/or Complexity of Data Reviewed Labs: ordered. Radiology: ordered.  Risk Prescription drug management.   This patient complains of generalized abdominal pain nausea vomiting; this involves an extensive number of treatment Options and is a complaint that carries with it a high risk of complications and morbidity. The differential includes obstruction, perforation, colitis, diverticulitis, cholelithiasis, cholecystitis, renal colic  I ordered, reviewed and interpreted labs, which included CBC with normal white count normal hemoglobin, chemistries normal other than mildly elevated glucose, creatinine elevated but better than priors, LFTs normal, lactate normal I ordered medication IV fluids pain medication nausea medication and reviewed PMP when indicated. I ordered imaging studies which included CT abdomen and pelvis and I independently    visualized and interpreted imaging which showed dilated loops possible enteritis no obstruction Previous records obtained and reviewed in epic no recent ED visits Cardiac monitoring reviewed, normal sinus rhythm Social determinants considered, no significant barriers Critical Interventions: None  After the interventions stated above, I reevaluated the patient and found patient to be symptomatically feeling better with benign abdominal exam Admission and  further testing considered, will p.o. trial her.  Her care is signed out to Dr. Lynelle Doctor to follow-up on response to p.o. trial.  Possible discharge if able to hold p.o. down and feeling better.         Final Clinical Impression(s) / ED Diagnoses Final diagnoses:  Generalized abdominal pain  Nausea and vomiting in adult    Rx / DC Orders ED Discharge Orders     None         Terrilee Files, MD 05/03/23 1747

## 2023-05-03 NOTE — ED Provider Notes (Signed)
Patient was initially seen by Dr. Charm Barges.  Please see his note.  Plan was to follow-up on her lactic acid level and make sure patient is able to eat eat and drink.  Patient's lactic acid level was normal.  Patient has been able to eat some crackers and drink some fluids.  She has not had any recurrent vomiting or diarrhea.  She feels comfortable with discharge.  Will discharge home with prescription for Zofran.  Warning signs and precautions discussed.   Linwood Dibbles, MD 05/03/23 [phone]

## 2023-05-04 DIAGNOSIS — R109 Unspecified abdominal pain: Secondary | ICD-10-CM | POA: Diagnosis not present

## 2023-05-16 DIAGNOSIS — E7800 Pure hypercholesterolemia, unspecified: Secondary | ICD-10-CM | POA: Diagnosis not present

## 2023-05-16 DIAGNOSIS — I5032 Chronic diastolic (congestive) heart failure: Secondary | ICD-10-CM | POA: Diagnosis not present

## 2023-05-16 DIAGNOSIS — N1831 Chronic kidney disease, stage 3a: Secondary | ICD-10-CM | POA: Diagnosis not present

## 2023-05-16 DIAGNOSIS — E039 Hypothyroidism, unspecified: Secondary | ICD-10-CM | POA: Diagnosis not present

## 2023-05-16 DIAGNOSIS — M170 Bilateral primary osteoarthritis of knee: Secondary | ICD-10-CM | POA: Diagnosis not present

## 2023-05-16 DIAGNOSIS — K219 Gastro-esophageal reflux disease without esophagitis: Secondary | ICD-10-CM | POA: Diagnosis not present

## 2023-05-16 DIAGNOSIS — D649 Anemia, unspecified: Secondary | ICD-10-CM | POA: Diagnosis not present

## 2023-05-16 DIAGNOSIS — I10 Essential (primary) hypertension: Secondary | ICD-10-CM | POA: Diagnosis not present

## 2023-07-16 DIAGNOSIS — E039 Hypothyroidism, unspecified: Secondary | ICD-10-CM | POA: Diagnosis not present

## 2023-07-16 DIAGNOSIS — I872 Venous insufficiency (chronic) (peripheral): Secondary | ICD-10-CM | POA: Diagnosis not present

## 2023-07-16 DIAGNOSIS — I130 Hypertensive heart and chronic kidney disease with heart failure and stage 1 through stage 4 chronic kidney disease, or unspecified chronic kidney disease: Secondary | ICD-10-CM | POA: Diagnosis not present

## 2023-07-16 DIAGNOSIS — I5032 Chronic diastolic (congestive) heart failure: Secondary | ICD-10-CM | POA: Diagnosis not present

## 2023-07-16 DIAGNOSIS — R7303 Prediabetes: Secondary | ICD-10-CM | POA: Diagnosis not present

## 2023-07-16 DIAGNOSIS — N1831 Chronic kidney disease, stage 3a: Secondary | ICD-10-CM | POA: Diagnosis not present

## 2023-07-16 DIAGNOSIS — R011 Cardiac murmur, unspecified: Secondary | ICD-10-CM | POA: Diagnosis not present

## 2023-07-16 DIAGNOSIS — E7800 Pure hypercholesterolemia, unspecified: Secondary | ICD-10-CM | POA: Diagnosis not present

## 2023-07-16 DIAGNOSIS — G629 Polyneuropathy, unspecified: Secondary | ICD-10-CM | POA: Diagnosis not present

## 2023-07-16 DIAGNOSIS — Z6841 Body Mass Index (BMI) 40.0 and over, adult: Secondary | ICD-10-CM | POA: Diagnosis not present

## 2023-07-31 DIAGNOSIS — E039 Hypothyroidism, unspecified: Secondary | ICD-10-CM | POA: Diagnosis not present

## 2023-09-11 DIAGNOSIS — E039 Hypothyroidism, unspecified: Secondary | ICD-10-CM | POA: Diagnosis not present

## 2023-10-10 DIAGNOSIS — I8312 Varicose veins of left lower extremity with inflammation: Secondary | ICD-10-CM | POA: Diagnosis not present

## 2023-10-10 DIAGNOSIS — I8311 Varicose veins of right lower extremity with inflammation: Secondary | ICD-10-CM | POA: Diagnosis not present

## 2023-10-31 DIAGNOSIS — H348192 Central retinal vein occlusion, unspecified eye, stable: Secondary | ICD-10-CM | POA: Diagnosis not present

## 2023-10-31 DIAGNOSIS — E119 Type 2 diabetes mellitus without complications: Secondary | ICD-10-CM | POA: Diagnosis not present

## 2023-10-31 DIAGNOSIS — H35033 Hypertensive retinopathy, bilateral: Secondary | ICD-10-CM | POA: Diagnosis not present

## 2023-10-31 DIAGNOSIS — H43811 Vitreous degeneration, right eye: Secondary | ICD-10-CM | POA: Diagnosis not present

## 2024-01-24 ENCOUNTER — Other Ambulatory Visit: Payer: Self-pay | Admitting: Internal Medicine

## 2024-01-24 DIAGNOSIS — Z1231 Encounter for screening mammogram for malignant neoplasm of breast: Secondary | ICD-10-CM

## 2024-01-25 DIAGNOSIS — E1122 Type 2 diabetes mellitus with diabetic chronic kidney disease: Secondary | ICD-10-CM | POA: Diagnosis not present

## 2024-01-25 DIAGNOSIS — E1136 Type 2 diabetes mellitus with diabetic cataract: Secondary | ICD-10-CM | POA: Diagnosis not present

## 2024-01-25 DIAGNOSIS — Z008 Encounter for other general examination: Secondary | ICD-10-CM | POA: Diagnosis not present

## 2024-01-25 DIAGNOSIS — I130 Hypertensive heart and chronic kidney disease with heart failure and stage 1 through stage 4 chronic kidney disease, or unspecified chronic kidney disease: Secondary | ICD-10-CM | POA: Diagnosis not present

## 2024-01-25 DIAGNOSIS — E1151 Type 2 diabetes mellitus with diabetic peripheral angiopathy without gangrene: Secondary | ICD-10-CM | POA: Diagnosis not present

## 2024-01-25 DIAGNOSIS — I272 Pulmonary hypertension, unspecified: Secondary | ICD-10-CM | POA: Diagnosis not present

## 2024-01-25 DIAGNOSIS — F324 Major depressive disorder, single episode, in partial remission: Secondary | ICD-10-CM | POA: Diagnosis not present

## 2024-01-25 DIAGNOSIS — H348192 Central retinal vein occlusion, unspecified eye, stable: Secondary | ICD-10-CM | POA: Diagnosis not present

## 2024-01-25 DIAGNOSIS — E1142 Type 2 diabetes mellitus with diabetic polyneuropathy: Secondary | ICD-10-CM | POA: Diagnosis not present

## 2024-01-25 DIAGNOSIS — J439 Emphysema, unspecified: Secondary | ICD-10-CM | POA: Diagnosis not present

## 2024-01-25 DIAGNOSIS — D6949 Other primary thrombocytopenia: Secondary | ICD-10-CM | POA: Diagnosis not present

## 2024-01-25 DIAGNOSIS — I509 Heart failure, unspecified: Secondary | ICD-10-CM | POA: Diagnosis not present

## 2024-02-07 ENCOUNTER — Telehealth: Payer: Self-pay | Admitting: Cardiovascular Disease

## 2024-02-07 NOTE — Telephone Encounter (Signed)
 Patient would like to have Dr. Elease Hashimoto review lab results from 2/04 labs with Dr. Ashley Akin Physician's. She says she and her thyroid doctor are making changes and she wants a second opinion if at all possible. Thank you.

## 2024-02-07 NOTE — Telephone Encounter (Signed)
 Called patient to inform that we don't have copies of lab results in the computer and I have faxed request to PCP asking for them to send [place] copies. Once we have them, I will get Dr Elease Hashimoto to look at them when back in office next week.

## 2024-02-11 NOTE — Telephone Encounter (Signed)
 Called and spoke to patient who states that her endocrinologist is wanting to change her thyroid medication dose and she's wanting Dr Elease Hashimoto to look at her recent labs and state whether he feels she should go up. Spoke to him during clinic and her advised that she should follow endocrinologist's recommendation and he really cannot weigh in on dosing for thyroid. Patient verbalized understanding.

## 2024-02-12 ENCOUNTER — Ambulatory Visit
Admission: RE | Admit: 2024-02-12 | Discharge: 2024-02-12 | Disposition: A | Discharge status: Home / Self Care | Payer: Medicare HMO | Source: Ambulatory Visit | Attending: Internal Medicine | Admitting: Internal Medicine

## 2024-02-12 DIAGNOSIS — Z1231 Encounter for screening mammogram for malignant neoplasm of breast: Secondary | ICD-10-CM | POA: Diagnosis not present

## 2024-03-22 ENCOUNTER — Encounter: Payer: Self-pay | Admitting: Cardiovascular Disease

## 2024-03-22 NOTE — Progress Notes (Unsigned)
 [name] Date of Birth  [birth year] [facility]        [address]    [address]     [place], [place]  [address]      [phone]  Fax  [phone]       Problem list: 1. Hypertension 2. Diastolic congestive heart failure 3. Hypothyroid 4. Obesity  5. History of seizures  [name] is a 73 year old female who presented to our office last month with shortness of breath. We started her on Lasix and potassium.  Feels well preserved left ventricular systolic function. She does have mild left ventricular hypertrophy. Her ejection fraction is around 60%.  She is feeling quite a bit better on the Lasix.  A BMP drawn at the time of her echocardiogram reveals that her potassium is 3.8. Her other electrolytes are okay to  She has had some GI bleeding She was found to have some hemorrhoids injected.  July 17, 2012: Pt is doing well.  She has had some lightheadedness and was reduce her Lasix. She now takes a half a tablet Lasix every third or fourth day a week. Otherwise she seems to be feeling fairly well.  May 25, 2013:  She is still very short of breath with exertion.  She has continued to gain weight.  She eats lots of breath.    06/21/2014:  She continues to have problems with weight.   She has borderline diabetes.  No CP, no dyspnea  June 27, 2015:  Was recently diagnosed with possible  Graves disease  ( according to eye doctor )  Breathing has been ok. Does not walk regularly   Nov. 3, 2017:  No CP , has some DOE  Has some wheezing  Has gained some weight .   Has mild leg swelling   March 19, 2018: Cidney is seen today for follow-up of her hypertension, chronic diastolic congestive heart failure and morbid obesity.  She was last seen in November, 2017.  Has DOE walking into the office. Has lost some weight.   10 lbs weight loss since Nov. 2018.    Has leg pain at night - no claudication pain with walking ( she asked about our PAD poster)  Primary MD  is managing her lipids   Aug. 19, 2020   Seen for follow of of chronic diastolic CHF.   BP is well controlled.  Is on a new med for her Graves disease.  Wt is 290 today  ( up 12 lbs from last )   Nov. 22, 2021 [name] is seen for follow up of her chronic diastolic CHF Wt. Today is 269. - down 21 lbs from last year  BP is well controlled.   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 12, 2023 [name] is seen for follow up visit of her diastoic CHF  Wt is 197 lbs ( down 74 lbs since last year)  She lost 60+ lbs during an acute illness ( diverticulitis)   No CP , no dyspnea  BP at home is well controlled.    Feb. 20, 2024  [name] is seen for follow up visit for her diastolic CHF  Wt is 235 lbs ( up 38 lbs from last year )   Breathing is ok  Able to exercise  Is not walking    March 23, 2024 [name] is seen for follow up of her morbid  obesity, chronic diastolic CHF. No CP,  Still has DOE  - likely due to her weight     Current Outpatient Medications on File Prior to Visit  Medication Sig Dispense Refill   albuterol (VENTOLIN HFA) 108 (90 Base) MCG/ACT inhaler Inhale 2 puffs into the lungs every 6 (six) hours as needed for wheezing or shortness of breath.     amLODipine (NORVASC) 5 MG tablet Take 5 mg by mouth daily.     Fluticasone-Salmeterol (ADVAIR) 250-50 MCG/DOSE AEPB Inhale 1 puff into the lungs daily as needed for allergies.     levothyroxine (SYNTHROID) 112 MCG tablet Take 1 tablet by mouth Monday through Saturday and 1/2 tablet on Sunday. (Patient taking differently: Take 100 mcg by mouth daily before breakfast. Take 1 tablet by mouth Monday through Saturday and 1/2 tablet on Sunday.) 30 tablet 3   rosuvastatin (CRESTOR) 5 MG tablet TAKE 1 TABLET (5 MG TOTAL) BY MOUTH DAILY. 90 tablet 3   Cyanocobalamin (B-12) 3000 MCG CAPS 1 tablet Sublingual once a day (Patient not taking: Reported on 03/23/2024)     ECHINACEA PO  Take by mouth. (Patient not taking: Reported on 03/23/2024)     feeding supplement (ENSURE ENLIVE / ENSURE PLUS) LIQD Take 237 mLs by mouth 2 (two) times daily between meals. (Patient not taking: Reported on 07/01/2021) 237 mL 12   Ferrous Sulfate (IRON) 325 (65 Fe) MG TABS 1 tablet (Patient not taking: Reported on 03/23/2024)     furosemide (LASIX) 20 MG tablet Take 1 tablet (20 mg total) by mouth daily. (Patient not taking: Reported on 03/23/2024) 7 tablet 0   glucose blood (ONETOUCH VERIO) test strip Use as intstructed to check blood sugar once a day Dx Code E11.9 (Patient not taking: Reported on 07/01/2021) 100 strip 12   Lancets (ONETOUCH ULTRASOFT) lancets Use as instructed (Patient not taking: Reported on 07/01/2021) 100 each 12   levETIRAcetam (KEPPRA) 500 MG tablet Take 1 tablet (500 mg total) by mouth 2 (two) times daily. (Patient not taking: Reported on 03/23/2024)     meclizine (ANTIVERT) 25 MG tablet Take 1 tablet by mouth daily as needed for dizziness. (Patient not taking: Reported on 12/28/2021)     Multiple Vitamin (MULTIVITAMIN WITH MINERALS) TABS tablet Take 1 tablet by mouth daily. (Patient not taking: Reported on 03/23/2024)     ondansetron (ZOFRAN) 4 MG tablet Take 4 mg by mouth every 8 (eight) hours as needed for nausea or vomiting. (Patient not taking: Reported on 12/28/2021)     ondansetron (ZOFRAN-ODT) 8 MG disintegrating tablet Take 1 tablet (8 mg total) by mouth every 8 (eight) hours as needed for nausea or vomiting. (Patient not taking: Reported on 03/23/2024) 12 tablet 0   OVER THE COUNTER MEDICATION Take 1 tablet by mouth daily. Medication: Vitamin b12 3000 mcg (Patient not taking: Reported on 12/28/2021)     pantoprazole (PROTONIX) 40 MG tablet Take 40 mg by mouth 2 (two) times daily. (Patient not taking: Reported on 12/28/2021)     polyvinyl alcohol (LIQUIFILM TEARS) 1.4 % ophthalmic solution Place 1 drop into both eyes as needed for dry eyes. (Patient not taking: Reported on 03/23/2024)      RYBELSUS 3 MG TABS Take 1 tablet by mouth every morning. (Patient not taking: Reported on 03/23/2024)     saccharomyces boulardii (FLORASTOR) 250 MG capsule Take 1 capsule (250 mg total) by mouth 2 (two) times daily. (Patient not taking: Reported on 12/28/2021)     No current facility-administered medications on  file prior to visit.    Allergies  Allergen Reactions   Atorvastatin     Other reaction(s): ?muscle aches   Gluten Meal     Celiac   Semaglutide     Other Reaction(s): Loose stools    Past Medical History:  Diagnosis Date   Anemia    Celiac disease    CHF (congestive heart failure) (HCC)    Depression    Diabetes mellitus without complication (HCC)    Edema    lower extremities   Emphysema of lung (HCC)    Enlarged heart    begining stage   Graves disease    Headache(784.0) 05/18/2013   Heart murmur    Hematochezia    Hemorrhoid    Hyperlipidemia    Hypertension    Migraine    Obese    OSA (obstructive sleep apnea)    Seizures (HCC)    Sleep apnea    Stroke (HCC)    left P-O ICH 2007   Thyroid disease    hypothyroidism   Ulcer    Vitamin D deficiency     Past Surgical History:  Procedure Laterality Date   CESAREAN SECTION     ESOPHAGOGASTRODUODENOSCOPY N/A 06/14/2021   Procedure: ESOPHAGOGASTRODUODENOSCOPY (EGD);  Surgeon: Jeani Hawking, MD;  [facility]COPY;  Service: Endoscopy;  Laterality: N/A;   ESOPHAGOGASTRODUODENOSCOPY (EGD) WITH PROPOFOL N/A 06/01/2021   Procedure: ESOPHAGOGASTRODUODENOSCOPY (EGD) WITH PROPOFOL;  Surgeon: Jeani Hawking, MD;  Location: [facility] ENDOSCOPY;  Service: Endoscopy;  Laterality: N/A;   HEMORRHOID SURGERY     IR IVC FILTER PLMT / S&I /IMG GUID/MOD SED  06/01/2021   IR SINUS/FIST TUBE CHK-NON GI  06/14/2021   IR SINUS/FIST TUBE CHK-NON GI  07/03/2021   IR US GUIDE VASC ACCESS RIGHT  06/01/2021   IUD REMOVAL     TONSILLECTOMY     VEIN SURGERY      Social History   Tobacco Use  Smoking Status Never  Smokeless Tobacco  Never    Social History   Substance and Sexual Activity  Alcohol Use No    Family History  Problem Relation Age of Onset   Heart disease Father    Diabetes Father    Heart failure Father    Diabetes Mother    Thyroid disease Sister    Diabetes Brother     Reviw of Systems:  Reviewed in the HPI.  All other systems are negative.    Physical Exam: Blood pressure 124/84, pulse 73, height 5\' 2"  (1.575 m), weight 260 lb (117.9 kg), SpO2 96%.       GEN:  morbidly obese female  in no acute distress HEENT: Normal NECK: No JVD; No carotid bruits LYMPHATICS: No lymphadenopathy CARDIAC: RRR  soft systolic murmur RESPIRATORY:  Clear to auscultation without rales, wheezing or rhonchi  ABDOMEN: Soft, non-tender, non-distended MUSCULOSKELETAL:  No edema; No deformity  SKIN: Warm and dry NEUROLOGIC:  Alert and oriented x 3     ECG:     EKG Interpretation Date/Time:  Monday March 23 2024 11:17:46 EDT Ventricular Rate:  73 PR Interval:  192 QRS Duration:  136 QT Interval:  450 QTC Calculation: 495 R Axis:   28  Text Interpretation: Normal sinus rhythm Possible Left atrial enlargement Left ventricular hypertrophy with QRS widening ( R in aVL , Cornell product ) Cannot rule out Septal infarct , age undetermined When compared with ECG of 01-Jul-2021 16:19, No significant change since last tracing Confirmed by Kristeen Miss [phone]) on 03/23/2024  11:27:37 AM     Assessment / Plan:   1. Hypertension-blood pressure is well-controlled.  Continue current medications.   2. Diastolic congestive heart failure-   she seems to be fairly euvolemic.  I once again encouraged her to work on weight loss.  She does have shortness breath with exertion but I suspect this is due to her morbid obesity.  3. Hypothyroid-    4. Obesity -    . Kristeen Miss, MD  03/23/2024 11:28 AM    [facility] [address] [place],  [address] [place], [place]  [address] Pager [phone] Phone: [phone]; Fax: [phone]

## 2024-03-23 ENCOUNTER — Ambulatory Visit: Payer: Medicare HMO | Attending: Cardiovascular Disease | Admitting: Cardiovascular Disease

## 2024-03-23 ENCOUNTER — Encounter: Payer: Self-pay | Admitting: Cardiovascular Disease

## 2024-03-23 VITALS — BP 124/84 | HR 73 | Ht 62.0 in | Wt 260.0 lb

## 2024-03-23 DIAGNOSIS — I2699 Other pulmonary embolism without acute cor pulmonale: Secondary | ICD-10-CM | POA: Diagnosis not present

## 2024-03-23 DIAGNOSIS — E785 Hyperlipidemia, unspecified: Secondary | ICD-10-CM

## 2024-03-23 DIAGNOSIS — I5032 Chronic diastolic (congestive) heart failure: Secondary | ICD-10-CM | POA: Diagnosis not present

## 2024-03-23 DIAGNOSIS — I10 Essential (primary) hypertension: Secondary | ICD-10-CM

## 2024-03-23 NOTE — Patient Instructions (Signed)
 Medication Instructions:  Your physician recommends that you continue on your current medications as directed. Please refer to the Current Medication list given to you today. *If you need a refill on your cardiac medications before your next appointment, please call your pharmacy*  Follow-Up: At [facility], you and your health needs are our priority.  As part of our continuing mission to provide you with exceptional heart care, our providers are all part of one team.  This team includes your primary Cardiologist (physician) and Advanced Practice Providers or APPs (Physician Assistants and Nurse Practitioners) who all work together to provide you with the care you need, when you need it.  Your next appointment:   1 year(s)  Provider:   Kristeen Miss, MD   We recommend signing up for the patient portal called "MyChart".  Sign up information is provided on this After Visit Summary.  MyChart is used to connect with patients for Virtual Visits (Telemedicine).  Patients are able to view lab/test results, encounter notes, upcoming appointments, etc.  Non-urgent messages can be sent to your provider as well.   To learn more about what you can do with MyChart, go to [email or web address].       1st Floor: - Lobby - Registration  - Pharmacy  - Lab - Cafe  2nd Floor: - PV Lab - Diagnostic Testing (echo, CT, nuclear med)  3rd Floor: - Vacant  4th Floor: - TCTS (cardiothoracic surgery) - AFib Clinic - Structural Heart Clinic - Vascular Surgery  - Vascular Ultrasound  5th Floor: - [facility] (general and EP) - Clinical Pharmacy for coumadin, hypertension, lipid, weight-loss medications, and med management appointments    Valet parking services will be available as well.

## 2024-04-02 DIAGNOSIS — K5730 Diverticulosis of large intestine without perforation or abscess without bleeding: Secondary | ICD-10-CM | POA: Diagnosis not present

## 2024-04-02 DIAGNOSIS — Z1211 Encounter for screening for malignant neoplasm of colon: Secondary | ICD-10-CM | POA: Diagnosis not present

## 2024-04-02 DIAGNOSIS — K900 Celiac disease: Secondary | ICD-10-CM | POA: Diagnosis not present

## 2024-04-15 DIAGNOSIS — Z1211 Encounter for screening for malignant neoplasm of colon: Secondary | ICD-10-CM | POA: Diagnosis not present

## 2024-04-15 DIAGNOSIS — Z1212 Encounter for screening for malignant neoplasm of rectum: Secondary | ICD-10-CM | POA: Diagnosis not present

## 2024-04-22 LAB — COLOGUARD: COLOGUARD: NEGATIVE

## 2024-05-28 DIAGNOSIS — E039 Hypothyroidism, unspecified: Secondary | ICD-10-CM | POA: Diagnosis not present

## 2024-06-15 DIAGNOSIS — E7800 Pure hypercholesterolemia, unspecified: Secondary | ICD-10-CM | POA: Diagnosis not present

## 2024-06-15 DIAGNOSIS — I5032 Chronic diastolic (congestive) heart failure: Secondary | ICD-10-CM | POA: Diagnosis not present

## 2024-06-15 DIAGNOSIS — M170 Bilateral primary osteoarthritis of knee: Secondary | ICD-10-CM | POA: Diagnosis not present

## 2024-06-15 DIAGNOSIS — E039 Hypothyroidism, unspecified: Secondary | ICD-10-CM | POA: Diagnosis not present

## 2024-07-16 DIAGNOSIS — E039 Hypothyroidism, unspecified: Secondary | ICD-10-CM | POA: Diagnosis not present

## 2024-07-16 DIAGNOSIS — I5032 Chronic diastolic (congestive) heart failure: Secondary | ICD-10-CM | POA: Diagnosis not present

## 2024-07-16 DIAGNOSIS — E7800 Pure hypercholesterolemia, unspecified: Secondary | ICD-10-CM | POA: Diagnosis not present

## 2024-07-16 DIAGNOSIS — M170 Bilateral primary osteoarthritis of knee: Secondary | ICD-10-CM | POA: Diagnosis not present

## 2024-08-05 DIAGNOSIS — E039 Hypothyroidism, unspecified: Secondary | ICD-10-CM | POA: Diagnosis not present

## 2024-08-16 DIAGNOSIS — M170 Bilateral primary osteoarthritis of knee: Secondary | ICD-10-CM | POA: Diagnosis not present

## 2024-08-16 DIAGNOSIS — E039 Hypothyroidism, unspecified: Secondary | ICD-10-CM | POA: Diagnosis not present

## 2024-08-16 DIAGNOSIS — E7800 Pure hypercholesterolemia, unspecified: Secondary | ICD-10-CM | POA: Diagnosis not present

## 2024-08-16 DIAGNOSIS — I5032 Chronic diastolic (congestive) heart failure: Secondary | ICD-10-CM | POA: Diagnosis not present

## 2024-09-02 DIAGNOSIS — R519 Headache, unspecified: Secondary | ICD-10-CM | POA: Diagnosis not present

## 2024-09-02 DIAGNOSIS — J988 Other specified respiratory disorders: Secondary | ICD-10-CM | POA: Diagnosis not present

## 2024-09-02 DIAGNOSIS — I5032 Chronic diastolic (congestive) heart failure: Secondary | ICD-10-CM | POA: Diagnosis not present

## 2024-09-02 DIAGNOSIS — R7303 Prediabetes: Secondary | ICD-10-CM | POA: Diagnosis not present

## 2024-09-02 DIAGNOSIS — I10 Essential (primary) hypertension: Secondary | ICD-10-CM | POA: Diagnosis not present

## 2024-09-02 DIAGNOSIS — R059 Cough, unspecified: Secondary | ICD-10-CM | POA: Diagnosis not present

## 2024-09-02 DIAGNOSIS — E039 Hypothyroidism, unspecified: Secondary | ICD-10-CM | POA: Diagnosis not present

## 2024-09-02 DIAGNOSIS — E7800 Pure hypercholesterolemia, unspecified: Secondary | ICD-10-CM | POA: Diagnosis not present

## 2024-09-02 DIAGNOSIS — N1831 Chronic kidney disease, stage 3a: Secondary | ICD-10-CM | POA: Diagnosis not present

## 2024-09-15 DIAGNOSIS — E7800 Pure hypercholesterolemia, unspecified: Secondary | ICD-10-CM | POA: Diagnosis not present

## 2024-09-15 DIAGNOSIS — M170 Bilateral primary osteoarthritis of knee: Secondary | ICD-10-CM | POA: Diagnosis not present

## 2024-09-15 DIAGNOSIS — I5032 Chronic diastolic (congestive) heart failure: Secondary | ICD-10-CM | POA: Diagnosis not present

## 2024-09-15 DIAGNOSIS — E039 Hypothyroidism, unspecified: Secondary | ICD-10-CM | POA: Diagnosis not present

## 2024-10-06 DIAGNOSIS — I10 Essential (primary) hypertension: Secondary | ICD-10-CM | POA: Diagnosis not present

## 2024-10-06 DIAGNOSIS — N1831 Chronic kidney disease, stage 3a: Secondary | ICD-10-CM | POA: Diagnosis not present

## 2024-10-16 DIAGNOSIS — M170 Bilateral primary osteoarthritis of knee: Secondary | ICD-10-CM | POA: Diagnosis not present

## 2024-10-16 DIAGNOSIS — E039 Hypothyroidism, unspecified: Secondary | ICD-10-CM | POA: Diagnosis not present

## 2024-10-16 DIAGNOSIS — I5032 Chronic diastolic (congestive) heart failure: Secondary | ICD-10-CM | POA: Diagnosis not present

## 2024-10-16 DIAGNOSIS — E7800 Pure hypercholesterolemia, unspecified: Secondary | ICD-10-CM | POA: Diagnosis not present

## 2024-11-15 DIAGNOSIS — M170 Bilateral primary osteoarthritis of knee: Secondary | ICD-10-CM | POA: Diagnosis not present

## 2024-11-15 DIAGNOSIS — E039 Hypothyroidism, unspecified: Secondary | ICD-10-CM | POA: Diagnosis not present

## 2024-11-15 DIAGNOSIS — E7800 Pure hypercholesterolemia, unspecified: Secondary | ICD-10-CM | POA: Diagnosis not present

## 2024-11-15 DIAGNOSIS — I5032 Chronic diastolic (congestive) heart failure: Secondary | ICD-10-CM | POA: Diagnosis not present
